# Patient Record
Sex: Female | Born: 1945
Health system: Southern US, Community
[De-identification: ages and names within clinical notes are randomized; demographics above are authoritative.]

## PROBLEM LIST (undated history)

## (undated) DIAGNOSIS — M419 Scoliosis, unspecified: Secondary | ICD-10-CM

## (undated) DIAGNOSIS — Z87442 Personal history of urinary calculi: Secondary | ICD-10-CM

## (undated) DIAGNOSIS — R569 Unspecified convulsions: Secondary | ICD-10-CM

## (undated) DIAGNOSIS — D649 Anemia, unspecified: Secondary | ICD-10-CM

## (undated) DIAGNOSIS — F329 Major depressive disorder, single episode, unspecified: Secondary | ICD-10-CM

## (undated) DIAGNOSIS — I729 Aneurysm of unspecified site: Secondary | ICD-10-CM

## (undated) DIAGNOSIS — T4145XA Adverse effect of unspecified anesthetic, initial encounter: Secondary | ICD-10-CM

## (undated) DIAGNOSIS — I10 Essential (primary) hypertension: Secondary | ICD-10-CM

## (undated) DIAGNOSIS — G8929 Other chronic pain: Secondary | ICD-10-CM

## (undated) DIAGNOSIS — R627 Adult failure to thrive: Secondary | ICD-10-CM

## (undated) DIAGNOSIS — K219 Gastro-esophageal reflux disease without esophagitis: Secondary | ICD-10-CM

## (undated) DIAGNOSIS — E876 Hypokalemia: Secondary | ICD-10-CM

## (undated) DIAGNOSIS — M1990 Unspecified osteoarthritis, unspecified site: Secondary | ICD-10-CM

## (undated) DIAGNOSIS — E43 Unspecified severe protein-calorie malnutrition: Secondary | ICD-10-CM

## (undated) DIAGNOSIS — R519 Headache, unspecified: Secondary | ICD-10-CM

## (undated) DIAGNOSIS — C44111 Basal cell carcinoma of skin of unspecified eyelid, including canthus: Secondary | ICD-10-CM

## (undated) DIAGNOSIS — B009 Herpesviral infection, unspecified: Secondary | ICD-10-CM

## (undated) DIAGNOSIS — F32A Depression, unspecified: Secondary | ICD-10-CM

## (undated) DIAGNOSIS — I639 Cerebral infarction, unspecified: Secondary | ICD-10-CM

## (undated) DIAGNOSIS — T8859XA Other complications of anesthesia, initial encounter: Secondary | ICD-10-CM

## (undated) DIAGNOSIS — R4701 Aphasia: Secondary | ICD-10-CM

## (undated) HISTORY — PX: WRIST SURGERY: SHX841

## (undated) HISTORY — PX: PARATHYROIDECTOMY: SHX19

## (undated) HISTORY — PX: ECTOPIC PREGNANCY SURGERY: SHX613

## (undated) HISTORY — PX: CEREBRAL ANEURYSM REPAIR: SHX164

## (undated) HISTORY — PX: COLONOSCOPY: SHX174

## (undated) HISTORY — PX: VOCAL CORD LATERALIZATION, ENDOSCOPIC APPROACH W/ MLB: SHX2664

## (undated) HISTORY — PX: TONSILLECTOMY: SUR1361

## (undated) HISTORY — PX: VAGINAL HYSTERECTOMY: SHX2639

---

## 1999-07-15 ENCOUNTER — Ambulatory Visit (HOSPITAL_COMMUNITY): Admission: RE | Admit: 1999-07-15 | Discharge: 1999-07-15 | Payer: Self-pay

## 1999-08-15 ENCOUNTER — Ambulatory Visit (HOSPITAL_COMMUNITY): Admission: RE | Admit: 1999-08-15 | Discharge: 1999-08-16 | Payer: Self-pay

## 1999-08-15 ENCOUNTER — Encounter (INDEPENDENT_AMBULATORY_CARE_PROVIDER_SITE_OTHER): Payer: Self-pay | Admitting: *Deleted

## 2000-02-26 ENCOUNTER — Ambulatory Visit (HOSPITAL_COMMUNITY): Admission: RE | Admit: 2000-02-26 | Discharge: 2000-02-26 | Payer: Self-pay | Admitting: Family Medicine

## 2000-02-26 ENCOUNTER — Encounter: Payer: Self-pay | Admitting: Family Medicine

## 2000-04-29 ENCOUNTER — Ambulatory Visit (HOSPITAL_COMMUNITY): Admission: RE | Admit: 2000-04-29 | Discharge: 2000-04-29 | Payer: Self-pay | Admitting: *Deleted

## 2000-04-29 ENCOUNTER — Encounter: Payer: Self-pay | Admitting: *Deleted

## 2000-05-28 ENCOUNTER — Encounter: Payer: Self-pay | Admitting: Family Medicine

## 2000-05-28 ENCOUNTER — Ambulatory Visit (HOSPITAL_COMMUNITY): Admission: RE | Admit: 2000-05-28 | Discharge: 2000-05-28 | Payer: Self-pay | Admitting: Family Medicine

## 2001-03-17 ENCOUNTER — Other Ambulatory Visit: Admission: RE | Admit: 2001-03-17 | Discharge: 2001-03-17 | Payer: Self-pay | Admitting: Family Medicine

## 2001-06-22 ENCOUNTER — Ambulatory Visit (HOSPITAL_COMMUNITY): Admission: RE | Admit: 2001-06-22 | Discharge: 2001-06-22 | Payer: Self-pay | Admitting: Gastroenterology

## 2003-02-09 ENCOUNTER — Encounter: Payer: Self-pay | Admitting: Family Medicine

## 2003-02-09 ENCOUNTER — Ambulatory Visit (HOSPITAL_COMMUNITY): Admission: RE | Admit: 2003-02-09 | Discharge: 2003-02-09 | Payer: Self-pay | Admitting: Family Medicine

## 2003-12-05 ENCOUNTER — Encounter: Admission: RE | Admit: 2003-12-05 | Discharge: 2003-12-05 | Payer: Self-pay | Admitting: Infectious Diseases

## 2004-01-30 ENCOUNTER — Encounter: Admission: RE | Admit: 2004-01-30 | Discharge: 2004-01-30 | Payer: Self-pay | Admitting: Infectious Diseases

## 2004-02-28 ENCOUNTER — Encounter: Admission: RE | Admit: 2004-02-28 | Discharge: 2004-02-28 | Payer: Self-pay | Admitting: Infectious Diseases

## 2004-05-05 ENCOUNTER — Encounter: Admission: RE | Admit: 2004-05-05 | Discharge: 2004-05-05 | Payer: Self-pay | Admitting: Infectious Diseases

## 2005-12-03 ENCOUNTER — Other Ambulatory Visit: Admission: RE | Admit: 2005-12-03 | Discharge: 2005-12-03 | Payer: Self-pay | Admitting: Obstetrics and Gynecology

## 2006-03-03 ENCOUNTER — Ambulatory Visit (HOSPITAL_COMMUNITY): Admission: RE | Admit: 2006-03-03 | Discharge: 2006-03-03 | Payer: Self-pay | Admitting: Family Medicine

## 2006-03-17 ENCOUNTER — Encounter: Admission: RE | Admit: 2006-03-17 | Discharge: 2006-03-17 | Payer: Self-pay | Admitting: Family Medicine

## 2006-12-27 ENCOUNTER — Inpatient Hospital Stay (HOSPITAL_COMMUNITY): Admission: EM | Admit: 2006-12-27 | Discharge: 2006-12-27 | Payer: Self-pay | Admitting: Emergency Medicine

## 2007-03-11 ENCOUNTER — Inpatient Hospital Stay (HOSPITAL_COMMUNITY): Admission: AD | Admit: 2007-03-11 | Discharge: 2007-03-15 | Payer: Self-pay | Admitting: Urology

## 2007-04-14 ENCOUNTER — Encounter: Admission: RE | Admit: 2007-04-14 | Discharge: 2007-04-14 | Payer: Self-pay | Admitting: Family Medicine

## 2007-04-21 ENCOUNTER — Encounter: Admission: RE | Admit: 2007-04-21 | Discharge: 2007-04-21 | Payer: Self-pay | Admitting: Family Medicine

## 2007-10-31 ENCOUNTER — Encounter: Admission: RE | Admit: 2007-10-31 | Discharge: 2007-10-31 | Payer: Self-pay | Admitting: Family Medicine

## 2008-05-16 ENCOUNTER — Encounter: Admission: RE | Admit: 2008-05-16 | Discharge: 2008-05-16 | Payer: Self-pay | Admitting: Family Medicine

## 2008-05-17 ENCOUNTER — Encounter (INDEPENDENT_AMBULATORY_CARE_PROVIDER_SITE_OTHER): Payer: Self-pay | Admitting: Diagnostic Radiology

## 2008-05-17 ENCOUNTER — Encounter: Admission: RE | Admit: 2008-05-17 | Discharge: 2008-05-17 | Payer: Self-pay | Admitting: Family Medicine

## 2008-11-01 ENCOUNTER — Other Ambulatory Visit: Admission: RE | Admit: 2008-11-01 | Discharge: 2008-11-01 | Payer: Self-pay | Admitting: Obstetrics and Gynecology

## 2009-07-02 ENCOUNTER — Encounter: Admission: RE | Admit: 2009-07-02 | Discharge: 2009-07-02 | Payer: Self-pay | Admitting: Family Medicine

## 2009-11-05 ENCOUNTER — Other Ambulatory Visit
Admission: RE | Admit: 2009-11-05 | Discharge: 2009-11-05 | Payer: Self-pay | Source: Home / Self Care | Admitting: Obstetrics and Gynecology

## 2010-08-29 ENCOUNTER — Encounter: Admission: RE | Admit: 2010-08-29 | Discharge: 2010-08-29 | Payer: Self-pay | Admitting: Family Medicine

## 2010-12-15 ENCOUNTER — Ambulatory Visit (HOSPITAL_COMMUNITY)
Admission: RE | Admit: 2010-12-15 | Discharge: 2010-12-15 | Disposition: A | Discharge status: Home / Self Care | Payer: Medicare Other | Attending: Psychiatry | Admitting: Psychiatry

## 2010-12-15 DIAGNOSIS — F339 Major depressive disorder, recurrent, unspecified: Secondary | ICD-10-CM | POA: Insufficient documentation

## 2011-01-30 ENCOUNTER — Other Ambulatory Visit: Payer: Self-pay | Admitting: Ophthalmology

## 2011-02-09 ENCOUNTER — Other Ambulatory Visit: Payer: Self-pay | Admitting: Ophthalmology

## 2011-02-10 NOTE — Discharge Summary (Signed)
Elaine Rivera, Elaine Rivera             ACCOUNT NO.:  [id number]   MEDICAL RECORD NO.:  [id number]          PATIENT TYPE:  INP   LOCATION:  1409                         FACILITY:  [facility]   PHYSICIAN:  Excell Seltzer. Annabell Howells, M.D.    DATE OF BIRTH:  1946/06/16   DATE OF ADMISSION:  03/11/2007  DATE OF DISCHARGE:  03/15/2007                               DISCHARGE SUMMARY   HISTORY OF PRESENT ILLNESS:  Briefly, Elaine Rivera is a 65 year old white  female who came to my office with the complain of chills, fever and  myalgias.  She had had a history of stones and was having left flank  pain that required oxycodone.  For additional details of her history and  physical, please see the dictated note.   ALLERGIES:  SULFA.   CURRENT MEDICATIONS:  1 . Boniva 150 mg monthly.  1. Sertraline 200 mg daily.  2. Caduet 10/40 daily.  3. Aleve as needed.   MEDICAL HISTORY:  1. Hypercholesterolemia.  2. Hyperparathyroidism.  3. Acid reflux.  4. Depression.  5. Anxiety.  6. Stones.  She has had prior lithotripsy x2.  A CT scan was obtained      in my office.  This reveals a left kidney with possible overt      nephronia.  There are multiple stones in the left kidney and mild      dilation in the left proximal ureter, but no significant      obstructing stones were seen.  Her blood pressure in the office was      actually low at 84/57 with a heart rate of 111, temperature 100.4.      She was felt to require discharge for a left pyelonephritis with      possible septicemia.  Secondary diagnoses include bilateral renal      calculi and cholelithiasis.   HOSPITAL COURSE:  On admission, the patient was started on IV Cipro and  fluids.  CBC, CMP and blood and urine cultures were obtained.  Her  admission white count was 18.9 with a hemoglobin 11.7.  Chemistries had  a sodium 133, potassium 3.2, glucose of 176, but was otherwise normal  with the except exception of albumin of 2.5.  Her blood cultures from  the right  hand grew an E-coli sensitive to Cipro.  Urine culture was  done in the office.   The patient responded well to antibiotic therapy with improvement in her  symptoms by the following day, and a decline in her temperature to  100.1.  She was voiding well.  Gentamicin was added to her regimen to  broaden coverage pending her cultures.  By June 16, she was feeling  better.  She still had some left flank pain and mild blunting.  Her T-  max was 101.1.  Her left flank pain had improved but she was slightly  distended.  Potassium was down to 3.  She was given potassium supplement  and continued on her antibiotic.  On June 17, she was feeling much  better with reduced pain.  She had been afebrile.  She was felt be  ready  for discharge home with a diagnosis of left pyelonephritis/urosepsis  with bilateral renal calculi.  This was complicated by hypokalemia.   DISCHARGE MEDICATIONS:  Cipro and resumption of her home medications.  She was instructed to follow-up with Dr. Aldean Ast in 2 weeks.   PROGNOSIS:  Good.   CONDITION ON DISCHARGE:  Improved.   DISPOSITION:  To home.      Excell Seltzer. Annabell Howells, M.D.  Electronically Signed     JJW/MEDQ  D:  03/31/2007  T:  04/01/2007  Job:  [id number]   cc:   Courtney Paris, M.D.  Fax: [phone]

## 2011-02-10 NOTE — H&P (Signed)
NAMERONNISHA, FELBER             ACCOUNT NO.:  [id number]   MEDICAL RECORD NO.:  [id number]           PATIENT TYPE:   LOCATION:                                 FACILITY:   PHYSICIAN:  Excell Seltzer. Annabell Howells, M.D.    DATE OF BIRTH:  1945/12/31   DATE OF ADMISSION:  03/11/2007  DATE OF DISCHARGE:                              HISTORY & PHYSICAL   Ms. Colberg is a 65 year old white female with a history of stones who  had the onset over the weekend of chills, fever, and myalgias.  She has  had some left flank pain severe enough to require oxycodone which she  last took this morning.  She has also had some nausea and vomiting.  She  denies hematuria.  She has had some dysuria with frequency and urgency.   PAST HISTORY:   ALLERGIES:  SULFA.   CURRENT MEDICATIONS:  1. Boniva 150 mg monthly.  2. Sertraline 200 mg daily.  3. Caduet 10/40 daily.  4. Aleve p.r.n.   MEDICAL HISTORY:  1. Hypercholesterolemia.  2. History of hyperparathyroidism.  3. Acid reflux.  4. Depression and anxiety.   SURGICAL HISTORY:  1. Left vertebrobasilar aneurysm repair in 1998.  2. Parathyroidectomy in 2002.  3. Hysterectomy in 1994.  4. Liposuction in 1991 and 1998.  5. Two prior tubal pregnancies in 1996 and 1997.  6. She had a compound fracture of her left wrist repaired on December 27, 2006.  7. Lithotripsy x2.   FAMILY HISTORY:  Pertinent for kidney stones in her father and her  daughter who also had blood in her urine.  Mother had cancer.  Father  had heart disease.   SOCIAL HISTORY:  Negative for tobacco.  She drinks occasional alcohol.  She is a Occupational psychologist.  She is divorced.   REVIEW OF SYSTEMS:  She has had fever, chills, night sweats, and  fatigue, some skin itching, blurred vision, double vision, sore throat  and sinus congestion over the weekend, some swollen glands in her neck,  shortness of breath, excessive thirst, chronic back pain and arthralgias  but it has been  worse with this current ailment, the nausea and  vomiting, left flank and abdominal pain, some diarrhea last weekend with  dark stools, headaches, dizziness, depression, anxiety.  She is  otherwise without complaints per our check list except as above.   PHYSICAL EXAMINATION:  VITAL SIGNS:  Her blood pressure is 84/57, heart  rate is 111, temp is 100.4.  GENERAL:  She is a well-developed, well-nourished, somewhat pale-  appearing white female in no acute distress.  Alert and oriented x3.  HEAD/FACE:  Normocephalic atraumatic.  NECK:  Supple without thyromegaly or bruit.  LUNGS:  Clear with normal effort.  HEART:  Regular rate and rhythm.  ABDOMEN:  Soft, flat with left CVA tenderness and left upper abdominal  tenderness.  No mass, hepatosplenomegaly, or hernias are noted.  She has  no cervical or supraclavicular adenopathy.  GU:  Not performed.  RECTAL:  Not performed.  EXTREMITIES:  Full range of motion without edema.  NEUROLOGIC:  She is grossly intact.  SKIN:  Warm and dry.   Her urinalysis today is nitrite positive with 15-50 white cells, 2+  bacteria on an unspun specimen.   A CT urogram was obtained today because of her history of stones.  This  demonstrates what appears to be a swollen left kidney with possible  lobar nephronia on the upper lateral aspect.  There are multiple stones  in the left kidney and mild dilation of the left proximal ureter without  evidence of a stone in the ureter.  There is significant perinephric  inflammatory change.  She does appear to have a probable diaphragmatic  hernia with scoliosis.  The right kidney has small calcifications but is  otherwise unremarkable.  There are multiple small gallstones in the  gallbladder.  A full radiologic report will be forthcoming.   I have reviewed our prior office records.   IMPRESSION:  1. Left pyelonephritis with vital signs suggestive of impending      septicemia.  2. Bilateral renal calculi.  3.  Cholelithiasis.   PLAN:  I am going to go ahead and admit her to the hospital for IV  hydration, IV Cipro, CBC, CMP, and blood cultures, a urine culture was  obtained in the office today.  I have discussed her case with Dr.  Sherron Monday who is on call for [place] this weekend.      Excell Seltzer. Annabell Howells, M.D.  Electronically Signed     JJW/MEDQ  D:  03/11/2007  T:  03/11/2007  Job:  [id number]   cc:   Dario Guardian, M.D.  Fax: [phone]

## 2011-02-13 NOTE — Op Note (Signed)
Elaine Rivera, Elaine Rivera             ACCOUNT NO.:  [id number]   MEDICAL RECORD NO.:  [id number]          PATIENT TYPE:  INP   LOCATION:  0098                         FACILITY:  [facility]   PHYSICIAN:  Artist Pais. Weingold, M.D.DATE OF BIRTH:  December 25, 1945   DATE OF PROCEDURE:  12/27/2006  DATE OF DISCHARGE:                               OPERATIVE REPORT   PREOPERATIVE DIAGNOSIS:  Grade 1 open fracture, radius and ulna, left  side.   POSTOPERATIVE DIAGNOSIS:  Grade 1 open fracture, radius and ulna, left  side.   PROCEDURE:  1. Open reduction internal fixation of above.  2. Irrigation and debridement.  3. Carpal tunnel release through separate incision.   SURGEON:  Artist Pais. Mina Marble, M.D.   ANESTHESIA:  General.   TOURNIQUET TIME:  1 hour.   No complication.  No drains.   OPERATIVE REPORT:  The patient was taken to the operating suite.  After  the induction of adequate general anesthesia, left upper extremity was  prepped and draped in sterile fashion.  Esmarch was used to exsanguinate  the limb.  Tourniquet was then inflated to 250 mmHg at this point in  time.  The open area along the volar ulnar aspect of the left ulna  distally was opened.  Dissection was carried down to the ulnar shaft.  The open fracture site was debrided of clot and nonviable material.  Once this was done and the hand was fully supinated, FCR approach was  undertaken to the distal radius.  Interval between the FCR and the  radial artery was identified and split.  The pronator quadratus was  subperiosteally stripped off the fracture site.  The fracture was  identified and debrided of clot and nonviable material.  Reduction was  performed.  DVR plate was then placed in lower aspect, and fracture  fixation was undertaken using standard DVR protocol.  Once this was  done, the wrist was stable in flexion, extension, pronation, supination.  The ulna was reduced manually and felt to be stable.  A third incision  was then made in the area of the palmar aspect of left hand and the  thenar crease 2 cm.  Skin was incised.  Palmar fascia was identified and  split.  The distal edge of the transverse carpal ligament was identified  and split with a 15 blade.  The median nerve was identified and  protected with a Therapist, nutritional.  The transverse ligament was divided.  There was  blood in the canal that was irrigated out.  All 3 wounds were irrigated  and closed with 3-0 Prolene subcuticular stitches.  Steri-Strips, 4x4s,  fluffs and a volar splint was applied.  The patient tolerated the  procedures well __________  fashion.      Artist Pais Mina Marble, M.D.  Electronically Signed     MAW/MEDQ  D:  12/27/2006  T:  12/28/2006  Job:  [id number]

## 2011-02-13 NOTE — Procedures (Signed)
[place]  Patient:    Elaine Rivera, Elaine Rivera Visit Number: [id number] MRN: [id number]          Service Type: END Location: ENDO Attending Physician:  Charna Elizabeth Dictated by:   Anselmo Rod, M.D. Proc. Date: 06/22/01 Admit Date:  06/22/2001   CC:         Dario Guardian, M.D.   Procedure Report  DATE OF BIRTH:  [birth year]  REFERRING PHYSICIAN:  Dario Guardian, M.D.  PROCEDURE PERFORMED:  Colonoscopy.  ENDOSCOPIST:  Anselmo Rod, M.D.  INSTRUMENT USED:  Olympus pediatric video colonoscope.  INDICATIONS FOR PROCEDURE:  Guaiac positive stool and history of diarrhea in a 65 year old white female rule out colonic polyps, masses, hemorrhoids, etc.  PREPROCEDURE PREPARATION:  Informed consent was procured from the patient. The patient was fasted for eight hours prior to the procedure and prepped with a bottle of magnesium citrate and a gallon of NuLytely the night prior to the procedure.  PREPROCEDURE PHYSICAL:  The patient had stable vital signs.  Neck supple. Chest clear to auscultation.  S1, S2 regular.  Abdomen soft with normal abdominal bowel sounds.  DESCRIPTION OF PROCEDURE:  The patient was placed in the left lateral decubitus position and sedated with 60 mg of Demerol and 6 mg of Versed intravenously.  Once the patient was adequately sedated and maintained on low-flow oxygen and continuous cardiac monitoring, the Olympus video colonoscope was advanced from the rectum to the cecum without difficulty. The entire colonic mucosa appeared healthy and without lesions, no masses polyps erosions, ulcerations or diverticula were seen.  IMPRESSION:  Normal colonoscopy.  RECOMMENDATIONS:  High fiber diet has been recommended for the patient and outpatient follow-up is advised in the next four weeks.Dictated by:   Anselmo Rod, M.D. Attending Physician:  Charna Elizabeth DD:  06/22/01 TD:  06/22/01 Job: [id number]

## 2011-02-13 NOTE — Consult Note (Signed)
NAMELAVENE, PENAGOS             ACCOUNT NO.:  [id number]   MEDICAL RECORD NO.:  [id number]          PATIENT TYPE:  INP   LOCATION:  0098                         FACILITY:  [facility]   PHYSICIAN:  Artist Pais. Weingold, M.D.DATE OF BIRTH:  Sep 11, 1946   DATE OF CONSULTATION:  12/27/2006  DATE OF DISCHARGE:                                 CONSULTATION   REQUESTING PHYSICIAN:  Trudi Ida. Denton Lank, M.D.   REASON FOR CONSULTATION:  Jaclyne Timberlake is a 65 year old right-hand  dominant female who fell on her outstretched left upper extremity.  Presents today with a grade 1 open fracture, radius and ulna to the left  side.  She is 65 years old.  She is right-hand dominant.   She has an allergy to SULFA DRUGS.   She is on Caduet for hypertension and hypercholesterolemia.   Her medical doctor is Dr. Merri Brunette.   No significant past medical or surgical history otherwise noted.   SOCIAL HISTORY:  Noncontributory.   PHYSICAL EXAMINATION:  GENERAL:  A well-developed and well-nourished  female who is pleasant, alert and oriented x3.  EXTREMITIES:  Examination of her left upper extremity, she has an  obvious deformity with bleeding along the ulnar volar aspect in the area  of the distal third of the ulna.  She has an obvious deformity with  pain.  She complains of intermittent numbness and tingling in the median  distribution.   X-rays show a fracture of the distal aspect of the radius and ulna,  which is comminuted.  She has a grade I open fracture.  We discussed  with Ms. Schrom the treatment options.  She needs to the operating room  emergently for an I&D ORIF for a grade I open fracture, radius and ulna,  on her nondominant left side.      Artist Pais Mina Marble, M.D.  Electronically Signed     MAW/MEDQ  D:  12/27/2006  T:  12/28/2006  Job:  [id number]

## 2011-03-04 ENCOUNTER — Ambulatory Visit: Payer: Medicare Other | Attending: Family Medicine | Admitting: Rehabilitation

## 2011-03-04 DIAGNOSIS — M2550 Pain in unspecified joint: Secondary | ICD-10-CM | POA: Insufficient documentation

## 2011-03-04 DIAGNOSIS — IMO0001 Reserved for inherently not codable concepts without codable children: Secondary | ICD-10-CM | POA: Insufficient documentation

## 2011-03-04 DIAGNOSIS — M4120 Other idiopathic scoliosis, site unspecified: Secondary | ICD-10-CM | POA: Insufficient documentation

## 2011-03-04 DIAGNOSIS — R293 Abnormal posture: Secondary | ICD-10-CM | POA: Insufficient documentation

## 2011-03-04 DIAGNOSIS — R262 Difficulty in walking, not elsewhere classified: Secondary | ICD-10-CM | POA: Insufficient documentation

## 2011-03-12 ENCOUNTER — Ambulatory Visit: Payer: Medicare Other | Admitting: Physical Therapy

## 2011-03-17 ENCOUNTER — Ambulatory Visit: Payer: Medicare Other | Admitting: Rehabilitation

## 2011-03-19 ENCOUNTER — Ambulatory Visit: Payer: Medicare Other | Admitting: Physical Therapy

## 2011-03-24 ENCOUNTER — Ambulatory Visit: Payer: Medicare Other | Admitting: Rehabilitation

## 2011-03-26 ENCOUNTER — Ambulatory Visit: Payer: Medicare Other | Admitting: Rehabilitation

## 2011-03-31 ENCOUNTER — Encounter: Payer: Medicare Other | Admitting: Rehabilitation

## 2011-04-02 ENCOUNTER — Ambulatory Visit: Payer: Medicare Other | Attending: Family Medicine | Admitting: Rehabilitation

## 2011-04-02 DIAGNOSIS — R262 Difficulty in walking, not elsewhere classified: Secondary | ICD-10-CM | POA: Insufficient documentation

## 2011-04-02 DIAGNOSIS — M2550 Pain in unspecified joint: Secondary | ICD-10-CM | POA: Insufficient documentation

## 2011-04-02 DIAGNOSIS — M4120 Other idiopathic scoliosis, site unspecified: Secondary | ICD-10-CM | POA: Insufficient documentation

## 2011-04-02 DIAGNOSIS — IMO0001 Reserved for inherently not codable concepts without codable children: Secondary | ICD-10-CM | POA: Insufficient documentation

## 2011-04-02 DIAGNOSIS — R293 Abnormal posture: Secondary | ICD-10-CM | POA: Insufficient documentation

## 2011-04-07 ENCOUNTER — Ambulatory Visit: Payer: Medicare Other | Admitting: Rehabilitation

## 2011-04-09 ENCOUNTER — Ambulatory Visit: Payer: Medicare Other | Admitting: Rehabilitation

## 2011-04-15 ENCOUNTER — Encounter: Payer: Medicare Other | Admitting: Rehabilitation

## 2011-04-16 ENCOUNTER — Ambulatory Visit: Payer: Medicare Other | Admitting: Rehabilitation

## 2011-04-21 ENCOUNTER — Encounter: Payer: Medicare Other | Admitting: Rehabilitation

## 2011-04-23 ENCOUNTER — Ambulatory Visit: Payer: Medicare Other | Admitting: Rehabilitation

## 2011-04-28 ENCOUNTER — Encounter: Payer: Medicare Other | Admitting: Rehabilitation

## 2011-04-29 ENCOUNTER — Ambulatory Visit: Payer: Medicare Other | Attending: Rehabilitation | Admitting: Rehabilitation

## 2011-04-29 DIAGNOSIS — M4120 Other idiopathic scoliosis, site unspecified: Secondary | ICD-10-CM | POA: Insufficient documentation

## 2011-04-29 DIAGNOSIS — M2550 Pain in unspecified joint: Secondary | ICD-10-CM | POA: Insufficient documentation

## 2011-04-29 DIAGNOSIS — IMO0001 Reserved for inherently not codable concepts without codable children: Secondary | ICD-10-CM | POA: Insufficient documentation

## 2011-04-29 DIAGNOSIS — R293 Abnormal posture: Secondary | ICD-10-CM | POA: Insufficient documentation

## 2011-04-29 DIAGNOSIS — R262 Difficulty in walking, not elsewhere classified: Secondary | ICD-10-CM | POA: Insufficient documentation

## 2011-05-05 ENCOUNTER — Ambulatory Visit: Payer: Medicare Other | Admitting: Rehabilitation

## 2011-05-07 ENCOUNTER — Ambulatory Visit: Payer: Medicare Other | Admitting: Rehabilitation

## 2011-05-12 ENCOUNTER — Ambulatory Visit: Payer: Medicare Other | Admitting: Rehabilitation

## 2011-05-14 ENCOUNTER — Ambulatory Visit: Payer: Medicare Other | Admitting: Rehabilitation

## 2011-05-19 ENCOUNTER — Ambulatory Visit: Payer: Medicare Other | Admitting: Rehabilitation

## 2011-05-21 ENCOUNTER — Encounter: Payer: Medicare Other | Admitting: Rehabilitation

## 2011-05-27 ENCOUNTER — Ambulatory Visit: Payer: Medicare Other | Admitting: Rehabilitation

## 2011-06-09 ENCOUNTER — Encounter: Payer: Medicare Other | Admitting: Rehabilitation

## 2011-06-11 ENCOUNTER — Encounter: Payer: Medicare Other | Admitting: Rehabilitation

## 2011-06-16 ENCOUNTER — Encounter: Payer: Medicare Other | Admitting: Rehabilitation

## 2011-06-18 ENCOUNTER — Ambulatory Visit: Payer: Medicare Other | Attending: Rehabilitation | Admitting: Rehabilitation

## 2011-06-18 DIAGNOSIS — R262 Difficulty in walking, not elsewhere classified: Secondary | ICD-10-CM | POA: Insufficient documentation

## 2011-06-18 DIAGNOSIS — M2550 Pain in unspecified joint: Secondary | ICD-10-CM | POA: Insufficient documentation

## 2011-06-18 DIAGNOSIS — R293 Abnormal posture: Secondary | ICD-10-CM | POA: Insufficient documentation

## 2011-06-18 DIAGNOSIS — M4120 Other idiopathic scoliosis, site unspecified: Secondary | ICD-10-CM | POA: Insufficient documentation

## 2011-06-18 DIAGNOSIS — IMO0001 Reserved for inherently not codable concepts without codable children: Secondary | ICD-10-CM | POA: Insufficient documentation

## 2011-06-30 ENCOUNTER — Ambulatory Visit: Payer: Medicare Other | Attending: Rehabilitation | Admitting: Rehabilitation

## 2011-06-30 DIAGNOSIS — R293 Abnormal posture: Secondary | ICD-10-CM | POA: Insufficient documentation

## 2011-06-30 DIAGNOSIS — M2550 Pain in unspecified joint: Secondary | ICD-10-CM | POA: Insufficient documentation

## 2011-06-30 DIAGNOSIS — IMO0001 Reserved for inherently not codable concepts without codable children: Secondary | ICD-10-CM | POA: Insufficient documentation

## 2011-06-30 DIAGNOSIS — M4120 Other idiopathic scoliosis, site unspecified: Secondary | ICD-10-CM | POA: Insufficient documentation

## 2011-06-30 DIAGNOSIS — R262 Difficulty in walking, not elsewhere classified: Secondary | ICD-10-CM | POA: Insufficient documentation

## 2011-07-01 ENCOUNTER — Encounter: Payer: Medicare Other | Admitting: Rehabilitation

## 2011-07-07 ENCOUNTER — Encounter: Payer: Medicare Other | Admitting: Rehabilitation

## 2011-07-09 ENCOUNTER — Encounter: Payer: Medicare Other | Admitting: Rehabilitation

## 2011-07-15 ENCOUNTER — Encounter: Payer: Medicare Other | Admitting: Rehabilitation

## 2011-07-15 LAB — CBC
HCT: 34.7 — ABNORMAL LOW
Hemoglobin: 11.7 — ABNORMAL LOW
RDW: 14
WBC: 16.1 — ABNORMAL HIGH

## 2011-07-15 LAB — BASIC METABOLIC PANEL
BUN: 11
CO2: 27
Calcium: 8.2 — ABNORMAL LOW
GFR calc Af Amer: >60
Sodium: 137

## 2011-07-16 ENCOUNTER — Encounter: Payer: Medicare Other | Admitting: Rehabilitation

## 2011-07-16 LAB — COMPREHENSIVE METABOLIC PANEL
ALT: 20
Albumin: 2.5 — ABNORMAL LOW
CO2: 26
Calcium: 8.7
Creatinine, Ser: 1.01
GFR calc Af Amer: >60
GFR calc non Af Amer: 56 — ABNORMAL LOW
Glucose, Bld: 176 — ABNORMAL HIGH
Total Protein: 6

## 2011-07-16 LAB — CBC
HCT: 34.8 — ABNORMAL LOW
Hemoglobin: 11.7 — ABNORMAL LOW
MCHC: 33.8
MCV: 85.6
Platelets: 263

## 2011-07-16 LAB — CULTURE, BLOOD (ROUTINE X 2): Culture: NO GROWTH

## 2011-10-08 ENCOUNTER — Other Ambulatory Visit: Payer: Self-pay | Admitting: Family Medicine

## 2011-10-08 DIAGNOSIS — Z1231 Encounter for screening mammogram for malignant neoplasm of breast: Secondary | ICD-10-CM

## 2011-11-05 ENCOUNTER — Other Ambulatory Visit: Payer: Self-pay | Admitting: Urology

## 2011-11-10 ENCOUNTER — Ambulatory Visit
Admission: RE | Admit: 2011-11-10 | Discharge: 2011-11-10 | Disposition: A | Discharge status: Home / Self Care | Payer: Medicare Other | Source: Ambulatory Visit | Attending: Family Medicine | Admitting: Family Medicine

## 2011-11-10 DIAGNOSIS — Z1231 Encounter for screening mammogram for malignant neoplasm of breast: Secondary | ICD-10-CM

## 2011-11-30 ENCOUNTER — Encounter (HOSPITAL_COMMUNITY): Payer: Self-pay | Admitting: *Deleted

## 2011-11-30 NOTE — Progress Notes (Signed)
This RN spoke with patient about lithotripsy scheduled for Monday, 12/07/2011 at [facility], Short Stay. Patient was instructed not to eat or drink after midnight, do not take any aspirin, ibuprofen, toradol , over the counter medications, multivitamins or herbs 72 hours before procedure. Take the laxative on Sunday afternoon and do not eat a heavy meal afterwards. Patient is to arrive at [facility] long short stay at 05:30am with her blue folder (with paperwork filled out), driver, and her drivers license and insurance card. There are no medications to take the morning of the procedure. Patient takes her BP medications at night. Patient to call short stay, [phone], if she has any further questions regarding the lithotripsy.

## 2011-12-03 ENCOUNTER — Encounter (HOSPITAL_COMMUNITY): Payer: Self-pay | Admitting: Pharmacy Technician

## 2011-12-06 NOTE — H&P (Signed)
Urology History and Physical Exam  CC: Nephrolithiasis  HPI: 66 year old female. History of urolithiasis. CT in December 2012 revealed a large stone burden on the left as well as stones on the right (left upper pole: 9.3 mm, 5.7 mm, 4.3 mm, 5 mm; left lower pole 6.5 mm, 9.7 mm, left mid lower pole 16 mm; right upper pole 9.4 mm, 4.3 mm; right middle pole 2.9 mm; right upper pole 3.6 mm). Her renal stones are visible on KUB.  I have recommended PCNL.  The patient has had shockwave lithotripsy in the past and would prefer to attempt this method again before more invasive procedures are undertaken.  I explained to her that due to the position of her kidney these stones would be less likely to clear the normal. We have discussed the risks, benefits, alternatives, and likelihood of achieving her goals. We discussed starting on the larger stone in her mid lower pole and if this breaks up we will then work on the left upper pole stone.   Her urine culture grew lactobacillus.   PMH: Past Medical History  Diagnosis Date  . Hypertension   . Basal cell carcinoma of eye     biopsy of left eye/ non cancerous  . Kidney stones     twice/ 1997 & 1998  . Depression     PSH: Past Surgical History  Procedure Date  . Ectopic pregnancy surgery     x2   . Vaginal hysterectomy     2001  . Cerebral aneurysm repair     1998  . Wrist surgery     left side/2008  . Wrist surgery     right side/ 2002  . Tonsillectomy     1953  . Parathyroidectomy     2002  . Vocal cord lateralization, endoscopic approach w/ mlb     2007 (@[facility] )    Allergies: Allergies  Allergen Reactions  . Sulfa Antibiotics Other (See Comments)    Pt. Took as as child. Patient stated, "I was told as a child never to take sulfa drugs again. I don't know what happens if it was given to me."    Medications: No prescriptions prior to admission     Social History: History   Social History  . Marital Status: Divorced   Spouse Name: N/A    Number of Children: N/A  . Years of Education: N/A   Occupational History  . Not on file.   Social History Main Topics  . Smoking status: Not on file  . Smokeless tobacco: Not on file  . Alcohol Use: Not on file  . Drug Use: Not on file  . Sexually Active: Not on file   Other Topics Concern  . Not on file   Social History Narrative  . No narrative on file    Family History: No family history on file.  Review of Systems: Positive: Right arm ache. Negative: Chest pain, SOB.  A further 10 point review of systems was negative except what is listed in the HPI.  Physical Exam:  General: No acute distress.  Awake. Head:  Normocephalic.  Atraumatic. ENT:  EOMI.  Mucous membranes moist Neck:  Supple.  No lymphadenopathy. CV:  S1 present. S2 present. Regular rate. Pulmonary: Equal effort bilaterally.  Clear to auscultation bilaterally. Abdomen: Soft.  Non- tender to palpation. Skin:  Normal turgor.  No visible rash. Extremity: No gross deformity of bilateral upper extremities.  No gross deformity of    bilateral lower  extremities. Neurologic: Alert. Appropriate mood.   Studies:  No results found for this basename: HGB:2,WBC:2,PLT:2 in the last 72 hours  No results found for this basename: NA:2,K:2,CL:2,CO2:2,BUN:2,CREATININE:2,CALCIUM:2,MAGNESIUM:2,GFRNONAA:2,GFRAA:2 in the last 72 hours   No results found for this basename: PT:2,INR:2,APTT:2 in the last 72 hours   No components found with this basename: ABG:2    Assessment:  Left nephrolithiasis  Plan: -Proceed with shockwave lithotripsy of the left kidney.

## 2011-12-07 ENCOUNTER — Encounter (HOSPITAL_COMMUNITY): Payer: Self-pay | Admitting: *Deleted

## 2011-12-07 ENCOUNTER — Ambulatory Visit (HOSPITAL_COMMUNITY): Payer: Medicare Other

## 2011-12-07 ENCOUNTER — Encounter (HOSPITAL_COMMUNITY)
Admission: RE | Disposition: A | Discharge status: Home / Self Care | Payer: Self-pay | Source: Ambulatory Visit | Attending: Urology

## 2011-12-07 ENCOUNTER — Ambulatory Visit (HOSPITAL_COMMUNITY)
Admission: RE | Admit: 2011-12-07 | Discharge: 2011-12-07 | Disposition: A | Discharge status: Home / Self Care | Payer: Medicare Other | Source: Ambulatory Visit | Attending: Urology | Admitting: Urology

## 2011-12-07 DIAGNOSIS — I498 Other specified cardiac arrhythmias: Secondary | ICD-10-CM | POA: Insufficient documentation

## 2011-12-07 DIAGNOSIS — I4949 Other premature depolarization: Secondary | ICD-10-CM | POA: Insufficient documentation

## 2011-12-07 DIAGNOSIS — M79609 Pain in unspecified limb: Secondary | ICD-10-CM | POA: Insufficient documentation

## 2011-12-07 DIAGNOSIS — N390 Urinary tract infection, site not specified: Secondary | ICD-10-CM | POA: Insufficient documentation

## 2011-12-07 DIAGNOSIS — Z01818 Encounter for other preprocedural examination: Secondary | ICD-10-CM | POA: Insufficient documentation

## 2011-12-07 DIAGNOSIS — N200 Calculus of kidney: Secondary | ICD-10-CM | POA: Insufficient documentation

## 2011-12-07 HISTORY — DX: Major depressive disorder, single episode, unspecified: F32.9

## 2011-12-07 HISTORY — DX: Basal cell carcinoma of skin of unspecified eyelid, including canthus: C44.111

## 2011-12-07 HISTORY — DX: Depression, unspecified: F32.A

## 2011-12-07 HISTORY — DX: Essential (primary) hypertension: I10

## 2011-12-07 SURGERY — LITHOTRIPSY, ESWL
Anesthesia: LOCAL | Laterality: Left

## 2011-12-07 MED ORDER — DEXTROSE-NACL 5-0.45 % IV SOLN
INTRAVENOUS | Status: DC
  Administered 2011-12-07: 07:00:00 via INTRAVENOUS

## 2011-12-07 MED ORDER — CEFAZOLIN SODIUM 1-5 GM-% IV SOLN
INTRAVENOUS | Status: AC
  Administered 2011-12-07: 1000 mg via INTRAVENOUS
  Filled 2011-12-07: qty 50

## 2011-12-07 MED ORDER — OXYCODONE-ACETAMINOPHEN 5-325 MG PO TABS
1.0000 | ORAL_TABLET | ORAL | Status: AC | PRN
Start: 2011-12-07 — End: 2011-12-17

## 2011-12-07 MED ORDER — DIAZEPAM 5 MG PO TABS
10.0000 mg | ORAL_TABLET | ORAL | Status: AC
  Administered 2011-12-07: 10 mg via ORAL

## 2011-12-07 MED ORDER — DIPHENHYDRAMINE HCL 25 MG PO CAPS
25.0000 mg | ORAL_CAPSULE | ORAL | Status: AC
  Administered 2011-12-07: 25 mg via ORAL

## 2011-12-07 MED ORDER — CIPROFLOXACIN HCL 500 MG PO TABS: 500.0000 mg | ORAL_TABLET | ORAL | Status: DC

## 2011-12-07 MED ORDER — CEFAZOLIN SODIUM 1-5 GM-% IV SOLN
1.0000 g | Freq: Once | INTRAVENOUS | Status: AC
  Administered 2011-12-07: 1000 mg via INTRAVENOUS

## 2011-12-07 NOTE — Discharge Instructions (Signed)
DISCHARGE INSTRUCTIONS FOR KIDNEY STONES   MEDICATIONS:   1. DO NOT RESUME YOUR ASPIRIN, or any other medicines like ibuprofen, motrin, excedrin, advil, aleve, vitamin E, fish oil as these can all cause bleeding x 7 days.  2. Resume all your other meds from home.  ACTIVITY 1. No strenuous activity x 1week 2. No driving while on narcotic pain medications 3. Drink plenty of water 4. Continue to walk at home - you can still get blood clots when you are at home, so keep active, but don't over do it. 5. May return to work in 3 days.  BATHING 1. You can shower or take a bath.   SIGNS/SYMPTOMS TO CALL: 1. Please call us if you have a fever greater than 101.5, uncontrolled  nausea/vomiting, uncontrolled pain, dizziness, unable to urinate, chest pain, shortness of breath, leg swelling, leg pain, redness around wound, drainage from wound, or any other concerns or questions.  You can reach us at [phone].  

## 2011-12-07 NOTE — Brief Op Note (Signed)
12/07/2011  9:03 AM  PATIENT:  Elaine Rivera  66 y.o. female  PRE-OPERATIVE DIAGNOSIS:  Left Nephrolithiasis  POST-OPERATIVE DIAGNOSIS:  Left nephrolithiasis  PROCEDURE:  Procedure(s) (LRB): EXTRACORPOREAL SHOCK WAVE LITHOTRIPSY (ESWL) (Left)  SURGEON:  Surgeon(s) and Role:    * Milford Cage, MD - Primary  PHYSICIAN ASSISTANT:   ASSISTANTS: none   ANESTHESIA:   IV sedation  EBL:     BLOOD ADMINISTERED:none  DRAINS: none   LOCAL MEDICATIONS USED:  NONE  SPECIMEN:  No Specimen  DISPOSITION OF SPECIMEN:  N/A  COUNTS:  YES  TOURNIQUET:  * No tourniquets in log *  DICTATION: .Note written in paper chart  PLAN OF CARE: Discharge to home after PACU  PATIENT DISPOSITION:  PACU - hemodynamically stable.   Delay start of Pharmacological VTE agent (>24hrs) due to surgical blood loss or risk of bleeding: yes

## 2011-12-07 NOTE — Progress Notes (Signed)
Left flank is reddened with superficial breakdown due to ESWL

## 2011-12-07 NOTE — Progress Notes (Signed)
Pt states she had been on Cipro x 7days for a UTI with the last does on Saturday 12-05-11 and states that she started having right arm pain on Friday. From in the forearm radiating up to above elbow and states she "looked it up and Cipro can affect the tendons"

## 2011-12-08 ENCOUNTER — Encounter (HOSPITAL_COMMUNITY): Payer: Self-pay

## 2011-12-08 MED FILL — Ciprofloxacin HCl Tab 500 MG (Base Equiv): ORAL | Qty: 1 | Status: AC

## 2012-03-15 ENCOUNTER — Other Ambulatory Visit: Payer: Self-pay | Admitting: Urology

## 2012-03-23 ENCOUNTER — Encounter (HOSPITAL_COMMUNITY): Payer: Self-pay | Admitting: Pharmacy Technician

## 2012-03-25 NOTE — Pre-Procedure Instructions (Signed)
Npo after midnight except sip with meds,, to arrive in [place] at 1030 am with blue folder, driver insurance info, picture ID. To follow laxative instructions in blue folder the evening before the procedure, to avoid taking any Aspirin, Ibuprofen, aleve prior to litho. Patient verbalized understanding of instructions, she had litho in March of this year.

## 2012-04-04 ENCOUNTER — Ambulatory Visit (HOSPITAL_COMMUNITY): Payer: Medicare Other

## 2012-04-04 ENCOUNTER — Ambulatory Visit (HOSPITAL_COMMUNITY)
Admission: RE | Admit: 2012-04-04 | Discharge: 2012-04-04 | Disposition: A | Discharge status: Home / Self Care | Payer: Medicare Other | Source: Ambulatory Visit | Attending: Urology | Admitting: Urology

## 2012-04-04 ENCOUNTER — Encounter (HOSPITAL_COMMUNITY)
Admission: RE | Disposition: A | Discharge status: Home / Self Care | Payer: Self-pay | Source: Ambulatory Visit | Attending: Urology

## 2012-04-04 ENCOUNTER — Encounter (HOSPITAL_COMMUNITY): Payer: Self-pay | Admitting: *Deleted

## 2012-04-04 DIAGNOSIS — I10 Essential (primary) hypertension: Secondary | ICD-10-CM | POA: Insufficient documentation

## 2012-04-04 DIAGNOSIS — Z79899 Other long term (current) drug therapy: Secondary | ICD-10-CM | POA: Insufficient documentation

## 2012-04-04 DIAGNOSIS — Z85840 Personal history of malignant neoplasm of eye: Secondary | ICD-10-CM | POA: Insufficient documentation

## 2012-04-04 DIAGNOSIS — N200 Calculus of kidney: Secondary | ICD-10-CM | POA: Insufficient documentation

## 2012-04-04 SURGERY — LITHOTRIPSY, ESWL
Anesthesia: LOCAL | Laterality: Left

## 2012-04-04 MED ORDER — DIPHENHYDRAMINE HCL 25 MG PO CAPS
25.0000 mg | ORAL_CAPSULE | ORAL | Status: AC
  Administered 2012-04-04: 25 mg via ORAL

## 2012-04-04 MED ORDER — DIAZEPAM 5 MG PO TABS
ORAL_TABLET | ORAL | Status: AC
  Filled 2012-04-04: qty 2

## 2012-04-04 MED ORDER — DEXTROSE-NACL 5-0.45 % IV SOLN
INTRAVENOUS | Status: DC
  Administered 2012-04-04: 1000 mL via INTRAVENOUS

## 2012-04-04 MED ORDER — CIPROFLOXACIN HCL 500 MG PO TABS
ORAL_TABLET | ORAL | Status: AC
  Filled 2012-04-04: qty 1

## 2012-04-04 MED ORDER — ONDANSETRON HCL 4 MG/2ML IJ SOLN
4.0000 mg | INTRAMUSCULAR | Status: DC | PRN
  Administered 2012-04-04: 4 mg via INTRAVENOUS

## 2012-04-04 MED ORDER — ONDANSETRON HCL 4 MG/2ML IJ SOLN
INTRAMUSCULAR | Status: AC
  Administered 2012-04-04: 4 mg via INTRAVENOUS
  Filled 2012-04-04: qty 2

## 2012-04-04 MED ORDER — HYDROMORPHONE HCL 2 MG PO TABS: 2.0000 mg | ORAL_TABLET | ORAL | Status: AC | PRN

## 2012-04-04 MED ORDER — DIPHENHYDRAMINE HCL 25 MG PO CAPS
ORAL_CAPSULE | ORAL | Status: AC
  Filled 2012-04-04: qty 1

## 2012-04-04 MED ORDER — DIAZEPAM 5 MG PO TABS
10.0000 mg | ORAL_TABLET | ORAL | Status: AC
  Administered 2012-04-04: 10 mg via ORAL

## 2012-04-04 MED ORDER — CIPROFLOXACIN HCL 500 MG PO TABS
500.0000 mg | ORAL_TABLET | ORAL | Status: AC
  Administered 2012-04-04: 500 mg via ORAL

## 2012-04-04 NOTE — Progress Notes (Signed)
Left flank with 4 cm x 4 cm area of redness with no skin breakdown s/p ESWL.  Pt c/o nausea. Paged Dr Margarita Grizzle to obtain antiemetic.

## 2012-04-04 NOTE — H&P (Signed)
Urology History and Physical Exam  CC: Left nephrolithiasis.  HPI: 66 year old female with a history of nephrolithiasis. She had SWL of left middle pole stone earlier in 2013. She returned to clinic to discuss her remaining stone burden. She had a CT March 15, 2012 which revealed several left upper pole stones measuring from 5mm to 3mm. We discussed management options and she elected to have SWL of the left upper pole stones. We have discussed the risks, benefits, alternatives, and likelihood of achieving her goals. Her urine culture from clinic the day of her CT was negative for growth. She denies fever.  PMH: Past Medical History  Diagnosis Date  . Hypertension   . Basal cell carcinoma of eye     biopsy of left eye/ non cancerous  . Kidney stones     twice/ 1997 & 1998  . Depression     PSH: Past Surgical History  Procedure Date  . Ectopic pregnancy surgery     x2   . Vaginal hysterectomy     2001  . Cerebral aneurysm repair     1998  . Wrist surgery     left side/2008  . Wrist surgery     right side/ 2002  . Tonsillectomy     1953  . Parathyroidectomy     2002  . Vocal cord lateralization, endoscopic approach w/ mlb     2007 (@[facility] )    Allergies: Allergies  Allergen Reactions  . Sulfa Antibiotics Other (See Comments)    Pt. Took as as child. Patient stated, "I was told as a child never to take sulfa drugs again. I don't know what happens if it was given to me."    Medications: Prescriptions prior to admission  Medication Sig Dispense Refill  . amLODipine (NORVASC) 10 MG tablet Take 10 mg by mouth at bedtime.       . DULoxetine (CYMBALTA) 60 MG capsule Take 60 mg by mouth 2 (two) times daily.       Marland Kitchen oxyCODONE-acetaminophen (PERCOCET) 5-325 MG per tablet Take 1 tablet by mouth every 4 (four) hours as needed. For pain      . acyclovir (ZOVIRAX) 400 MG tablet Take 400 mg by mouth daily.          Social History: History   Social History  . Marital Status:  Divorced    Spouse Name: N/A    Number of Children: N/A  . Years of Education: N/A   Occupational History  . Not on file.   Social History Main Topics  . Smoking status: Not on file  . Smokeless tobacco: Not on file  . Alcohol Use: Not on file  . Drug Use: Not on file  . Sexually Active: Not on file   Other Topics Concern  . Not on file   Social History Narrative  . No narrative on file    Family History: History reviewed. No pertinent family history.  Review of Systems: Positive: None Negative: Fever, chest pain, SOB.  A further 10 point review of systems was negative except what is listed in the HPI.  Physical Exam:  General: No acute distress.  Awake. Head:  Normocephalic.  Atraumatic. ENT:  EOMI.  Mucous membranes moist Neck:  Supple.  No lymphadenopathy. CV:  S1 present. S2 present. Regular rate. Pulmonary: Equal effort bilaterally.  Clear to auscultation bilaterally. Abdomen: Soft.  Non- tender to palpation. Skin:  Normal turgor.  No visible rash. Extremity: No gross deformity of bilateral upper  extremities.  No gross deformity of    bilateral lower extremities. Neurologic: Alert. Appropriate mood.    Studies:  No results found for this basename: HGB:2,WBC:2,PLT:2 in the last 72 hours  No results found for this basename: NA:2,K:2,CL:2,CO2:2,BUN:2,CREATININE:2,CALCIUM:2,MAGNESIUM:2,GFRNONAA:2,GFRAA:2 in the last 72 hours   No results found for this basename: PT:2,INR:2,APTT:2 in the last 72 hours   No components found with this basename: ABG:2    Assessment:  Renal stone.  Plan: SWL of left upper pole medial renal stone.

## 2012-04-04 NOTE — Brief Op Note (Signed)
04/04/2012  3:11 PM  PATIENT:  Diamonique Gardner Candle  66 y.o. female  PRE-OPERATIVE DIAGNOSIS:  LEFT NEPHROLITHIASIS  POST-OPERATIVE DIAGNOSIS:  Left nephrolithiasis  PROCEDURE:  Procedure(s) (LRB): EXTRACORPOREAL SHOCK WAVE LITHOTRIPSY (ESWL) (Left)  SURGEON:  Surgeon(s) and Role:    * Milford Cage, MD - Primary  PHYSICIAN ASSISTANT:   ASSISTANTS: none   ANESTHESIA:   IV sedation  EBL:   None  BLOOD ADMINISTERED:none  DRAINS: none   LOCAL MEDICATIONS USED:  NONE  SPECIMEN:  No Specimen  DISPOSITION OF SPECIMEN:  N/A  COUNTS:  YES  TOURNIQUET:  * No tourniquets in log *  DICTATION: .Note written in paper chart  PLAN OF CARE: Discharge to home after PACU  PATIENT DISPOSITION:  PACU - hemodynamically stable.   Delay start of Pharmacological VTE agent (>24hrs) due to surgical blood loss or risk of bleeding: yes

## 2012-09-20 ENCOUNTER — Encounter (HOSPITAL_COMMUNITY): Payer: Self-pay | Admitting: *Deleted

## 2012-09-20 ENCOUNTER — Emergency Department (HOSPITAL_COMMUNITY): Payer: Medicare Other

## 2012-09-20 ENCOUNTER — Emergency Department (HOSPITAL_COMMUNITY)
Admission: EM | Admit: 2012-09-20 | Discharge: 2012-09-20 | Disposition: A | Discharge status: Home / Self Care | Payer: Medicare Other | Attending: Emergency Medicine | Admitting: Emergency Medicine

## 2012-09-20 DIAGNOSIS — F3289 Other specified depressive episodes: Secondary | ICD-10-CM | POA: Insufficient documentation

## 2012-09-20 DIAGNOSIS — Y9389 Activity, other specified: Secondary | ICD-10-CM | POA: Insufficient documentation

## 2012-09-20 DIAGNOSIS — T07XXXA Unspecified multiple injuries, initial encounter: Secondary | ICD-10-CM | POA: Insufficient documentation

## 2012-09-20 DIAGNOSIS — I10 Essential (primary) hypertension: Secondary | ICD-10-CM | POA: Insufficient documentation

## 2012-09-20 DIAGNOSIS — S139XXA Sprain of joints and ligaments of unspecified parts of neck, initial encounter: Secondary | ICD-10-CM | POA: Insufficient documentation

## 2012-09-20 DIAGNOSIS — Z87442 Personal history of urinary calculi: Secondary | ICD-10-CM | POA: Insufficient documentation

## 2012-09-20 DIAGNOSIS — Z79899 Other long term (current) drug therapy: Secondary | ICD-10-CM | POA: Insufficient documentation

## 2012-09-20 DIAGNOSIS — F329 Major depressive disorder, single episode, unspecified: Secondary | ICD-10-CM | POA: Insufficient documentation

## 2012-09-20 DIAGNOSIS — IMO0002 Reserved for concepts with insufficient information to code with codable children: Secondary | ICD-10-CM | POA: Insufficient documentation

## 2012-09-20 DIAGNOSIS — Z85840 Personal history of malignant neoplasm of eye: Secondary | ICD-10-CM | POA: Insufficient documentation

## 2012-09-20 DIAGNOSIS — S161XXA Strain of muscle, fascia and tendon at neck level, initial encounter: Secondary | ICD-10-CM

## 2012-09-20 MED ORDER — BACITRACIN ZINC 500 UNIT/GM EX OINT
1.0000 "application " | TOPICAL_OINTMENT | Freq: Once | CUTANEOUS | Status: AC
  Administered 2012-09-20: 1 via TOPICAL
  Filled 2012-09-20: qty 2.7

## 2012-09-20 MED ORDER — HYDROCODONE-ACETAMINOPHEN 5-325 MG PO TABS: 1.0000 | ORAL_TABLET | Freq: Four times a day (QID) | ORAL | Status: DC | PRN

## 2012-09-20 MED ORDER — HYDROCODONE-ACETAMINOPHEN 5-325 MG PO TABS
1.0000 | ORAL_TABLET | Freq: Once | ORAL | Status: AC
  Administered 2012-09-20: 1 via ORAL
  Filled 2012-09-20: qty 1
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
## 2013-10-18 ENCOUNTER — Other Ambulatory Visit: Payer: Self-pay

## 2013-10-18 DIAGNOSIS — Z1231 Encounter for screening mammogram for malignant neoplasm of breast: Secondary | ICD-10-CM

## 2013-11-13 ENCOUNTER — Ambulatory Visit: Payer: Medicare Other

## 2013-11-29 ENCOUNTER — Ambulatory Visit
Admission: RE | Admit: 2013-11-29 | Discharge: 2013-11-29 | Disposition: A | Discharge status: Home / Self Care | Payer: Medicare Other | Source: Ambulatory Visit

## 2013-11-29 DIAGNOSIS — Z1231 Encounter for screening mammogram for malignant neoplasm of breast: Secondary | ICD-10-CM

## 2015-11-06 ENCOUNTER — Other Ambulatory Visit: Payer: Self-pay

## 2015-11-06 DIAGNOSIS — Z1231 Encounter for screening mammogram for malignant neoplasm of breast: Secondary | ICD-10-CM

## 2015-11-27 ENCOUNTER — Ambulatory Visit
Admission: RE | Admit: 2015-11-27 | Discharge: 2015-11-27 | Disposition: A | Discharge status: Home / Self Care | Payer: Medicare Other | Source: Ambulatory Visit

## 2015-11-27 DIAGNOSIS — Z1231 Encounter for screening mammogram for malignant neoplasm of breast: Secondary | ICD-10-CM

## 2016-01-16 ENCOUNTER — Ambulatory Visit: Payer: Medicare Other | Attending: Neurosurgery | Admitting: Physical Therapy

## 2016-01-16 ENCOUNTER — Encounter: Payer: Self-pay | Admitting: Physical Therapy

## 2016-01-16 DIAGNOSIS — M6281 Muscle weakness (generalized): Secondary | ICD-10-CM | POA: Insufficient documentation

## 2016-01-16 DIAGNOSIS — M545 Low back pain: Secondary | ICD-10-CM
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
## 2016-01-20 ENCOUNTER — Encounter: Payer: Self-pay | Admitting: Physical Therapy

## 2016-01-20 ENCOUNTER — Ambulatory Visit: Payer: Medicare Other | Admitting: Physical Therapy

## 2016-01-20 DIAGNOSIS — M545 Low back pain: Secondary | ICD-10-CM | POA: Diagnosis not present

## 2016-01-20 DIAGNOSIS — M6281 Muscle weakness (generalized): Secondary | ICD-10-CM

## 2016-01-20 NOTE — Therapy (Signed)
[place], [place], [address] Phone: [phone]   Fax:  [phone]  Physical Therapy Treatment  Patient Details  Name: Elaine Rivera MRN: [id number] Date of Birth: 04-28-1946 Referring Provider: Almon Register, MD  Encounter Date: 01/20/2016      PT End of Session - 01/20/16 0849    Visit Number 2   Number of Visits 13   Date for PT Re-Evaluation 02/27/16   Authorization Type U[employer] MCR  Kx at 15th visit   PT Start Time 0845   PT Stop Time 0930   PT Time Calculation (min) 45 min   Activity Tolerance Patient tolerated treatment well   Behavior During Therapy [facility] for tasks assessed/performed      Past Medical History  Diagnosis Date  . Hypertension   . Basal cell carcinoma of eye     biopsy of left eye/ non cancerous  . Kidney stones     twice/ [place]  . Depression     Past Surgical History  Procedure Laterality Date  . Ectopic pregnancy surgery      x2   . Vaginal hysterectomy      2001  . Cerebral aneurysm repair      1998  . Wrist surgery      left side/2008  . Wrist surgery      right side/ 2002  . Tonsillectomy      1953  . Parathyroidectomy      2002  . Vocal cord lateralization, endoscopic approach w/ mlb      2007 (@[facility] )    There were no vitals filed for this visit.      Subjective Assessment - 01/20/16 0847    Subjective No increase in pain following eval, has to be standing for a while before pain becomes too intense. Has not been up long yet this morning.    Currently in Pain? Yes   Pain Score 5    Pain Location Back                         [facility] Adult PT Treatment/Exercise - 01/20/16 0001    Lumbar Exercises: Seated   Sit to Stand 10 reps   Lumbar Exercises: Supine   Ab Set Other (comment)  2 minutes   Bent Knee Raise Other (comment)  x30 each with TrA    Knee/Hip Exercises: Aerobic   Nustep 10 min   Knee/Hip Exercises: Standing    Heel Raises Other (comment)  30 reps at counter   Knee/Hip Exercises: Supine   Bridges with Diona Foley Squeeze Other (comment)  x30   Bridges with Clamshell Other (comment)  x30 red TB, no bridge   Shoulder Exercises: Supine   Horizontal ABduction 20 reps  cues for TrA   Theraband Level (Shoulder Horizontal ABduction) Level 1 (Yellow)                PT Education - 01/20/16 0931    Education provided Yes   Education Details soreness as result of activating core at spinal attachment site; acknowledging soreness v pain and when to stop exercises; decrease in soreness when musculature is strong enough to provide support.    Person(s) Educated Patient   Methods Explanation;Verbal cues;Tactile cues   Comprehension Verbalized understanding;Returned demonstration;Verbal cues required;Need further instruction;Tactile cues required          PT Short Term Goals - 01/16/16 1535    PT SHORT  TERM GOAL #1   Title Pt will be independent with HEP as it is so far established by 5/11   Time 3   Period Weeks   Status New   PT SHORT TERM GOAL #2   Title Patient will report joining gym that she will be continuuing at by 5/11   Time 3   Period Weeks   Status New           PT Long Term Goals - 01/16/16 1536    PT LONG TERM GOAL #1   Title Patient will verbalize feeling of improved endurance and strength by 6/1   Time 6   Period Weeks   Status New   PT LONG TERM GOAL #2   Title patient will report average pain <4/10 by 6/1   Time 6   Period Weeks   Status New   PT LONG TERM GOAL #3   Title LE MMT all to 4/5 without back pain by 6/1   Time 6   Period Weeks   Status New   PT LONG TERM GOAL #4   Title Patient will be able to demonstrate appropraite contraction of transverse abdominis with all activities with no to minimal cuing by 6/1    Time 6   Period Weeks   Status New   PT LONG TERM GOAL #5   Title FOTO to 49% ability   Time 6   Period Weeks   Status New                Plan - 01/20/16 [id number]    Clinical Impression Statement Patient had difficulty engaging transverse abdominis today, esp when asked to breathe normally and incoorporate LE motions. Will continue to benefit from skilled PT to improve postural strength and endurance.       Patient will benefit from skilled therapeutic intervention in order to improve the following deficits and impairments:  Decreased range of motion, Difficulty walking, Pain, Decreased activity tolerance, Decreased endurance, Improper body mechanics, Decreased mobility, Decreased strength, Postural dysfunction  Visit Diagnosis: Bilateral low back pain, with sciatica presence unspecified  Muscle weakness (generalized)     Problem List There are no active problems to display for this patient.   Yong Grieser C. Nevae Pinnix PT, DPT 01/20/2016 9:39 AM   [facility] [address] [place], [place], [address] Phone: [phone]   Fax:  [phone]  Name: Elaine Rivera MRN: [id number] Date of Birth: [birth year]

## 2016-01-22 ENCOUNTER — Ambulatory Visit: Payer: Medicare Other | Admitting: Physical Therapy

## 2016-01-27 ENCOUNTER — Ambulatory Visit: Payer: Medicare Other | Admitting: Physical Therapy

## 2016-01-29 ENCOUNTER — Ambulatory Visit: Payer: Medicare Other | Admitting: Physical Therapy

## 2016-02-04 ENCOUNTER — Ambulatory Visit: Payer: Medicare Other | Attending: Neurosurgery | Admitting: Physical Therapy

## 2016-02-04 ENCOUNTER — Encounter: Payer: Self-pay | Admitting: Physical Therapy

## 2016-02-04 DIAGNOSIS — M6281 Muscle weakness (generalized): Secondary | ICD-10-CM | POA: Diagnosis present

## 2016-02-04 DIAGNOSIS — M545 Low back pain: Secondary | ICD-10-CM | POA: Insufficient documentation

## 2016-02-04 NOTE — Therapy (Addendum)
[place], [place], [address] Phone: [phone]   Fax:  [phone]  Physical Therapy Treatment/Discharge Summary  Patient Details  Name: Elaine Rivera MRN: [id number] Date of Birth: 1946-04-06 Referring Provider: Almon Register, MD  Encounter Date: 02/04/2016      PT End of Session - 02/04/16 1344    Visit Number 3   Number of Visits 13   Date for PT Re-Evaluation 02/27/16   Authorization Type U[employer] MCR  Kx at 15th visit   PT Start Time 1339   PT Stop Time 1425   PT Time Calculation (min) 46 min   Activity Tolerance Patient tolerated treatment well   Behavior During Therapy [facility] for tasks assessed/performed      Past Medical History  Diagnosis Date  . Hypertension   . Basal cell carcinoma of eye     biopsy of left eye/ non cancerous  . Kidney stones     twice/ [place]  . Depression     Past Surgical History  Procedure Laterality Date  . Ectopic pregnancy surgery      x2   . Vaginal hysterectomy      2001  . Cerebral aneurysm repair      1998  . Wrist surgery      left side/2008  . Wrist surgery      right side/ 2002  . Tonsillectomy      1953  . Parathyroidectomy      2002  . Vocal cord lateralization, endoscopic approach w/ mlb      2007 (_[facility] )    There were no vitals filed for this visit.      Subjective Assessment - 02/04/16 1343    Subjective Reports the weather and being up for a while so far today has resulted in increased pain.    Currently in Pain? Yes   Pain Score 7                          [facility] Adult PT Treatment/Exercise - 02/04/16 0001    Lumbar Exercises: Seated   Other Seated Lumbar Exercises seated dead bug 3x10   Lumbar Exercises: Supine   Ab Set Other (comment)  seated   Knee/Hip Exercises: Aerobic   Nustep 10 min  L5   Knee/Hip Exercises: Seated   Clamshell with TheraBand Red  3x10   Shoulder Exercises: Seated   Abduction Theraband   Theraband Level (Shoulder ABduction) Level 1 (Yellow)   ABduction Limitations 2x10   Other Seated Exercises seated press ups                PT Education - 02/04/16 1432    Education provided Yes   Education Details exercise form and rationale; exercise and postural endurance.    Person(s) Educated Patient   Methods Explanation;Demonstration;Tactile cues;Verbal cues;Handout   Comprehension Verbalized understanding;Returned demonstration;Verbal cues required;Tactile cues required;Need further instruction          PT Short Term Goals - 01/16/16 1535    PT SHORT TERM GOAL #1   Title Pt will be independent with HEP as it is so far established by 5/11   Time 3   Period Weeks   Status New   PT SHORT TERM GOAL #2   Title Patient will report joining gym that she will be continuuing at by 5/11   Time 3   Period Weeks   Status New  PT Long Term Goals - 01/16/16 1536    PT LONG TERM GOAL #1   Title Patient will verbalize feeling of improved endurance and strength by 6/1   Time 6   Period Weeks   Status New   PT LONG TERM GOAL #2   Title patient will report average pain <4/10 by 6/1   Time 6   Period Weeks   Status New   PT LONG TERM GOAL #3   Title LE MMT all to 4/5 without back pain by 6/1   Time 6   Period Weeks   Status New   PT LONG TERM GOAL #4   Title Patient will be able to demonstrate appropraite contraction of transverse abdominis with all activities with no to minimal cuing by 6/1    Time 6   Period Weeks   Status New   PT LONG TERM GOAL #5   Title FOTO to 49% ability   Time 6   Period Weeks   Status New               Plan - 02/04/16 1433    Clinical Impression Statement Pt required frequent VC for posture. Tendency to shift weight into R hip when fatigued and had difficulty breathing during exercises.    PT Next Visit Plan core and hip strengthening exercises within tolerance of back. Increase standing  exercises   PT Home Exercise Plan seated hip abd and adduction, seated dead bug, seated UE horizontal abduction, seated press ups, seated pelvic tilts.    Consulted and Agree with Plan of Care Patient      Patient will benefit from skilled therapeutic intervention in order to improve the following deficits and impairments:  Decreased range of motion, Difficulty walking, Pain, Decreased activity tolerance, Decreased endurance, Improper body mechanics, Decreased mobility, Decreased strength, Postural dysfunction  Visit Diagnosis: Bilateral low back pain, with sciatica presence unspecified  Muscle weakness (generalized)     Problem List There are no active problems to display for this patient.  Carlisle Enke C. Merrin Mcvicker PT, DPT 02/04/2016 2:37 PM   [facility] [address] [place], [place], [address] Phone: [phone]   Fax:  [phone]  Name: HONG TIMM MRN: [id number] Date of Birth: 08-27-1946   PHYSICAL THERAPY DISCHARGE SUMMARY  Visits from Start of Care: 3  Current functional level related to goals / functional outcomes: See above   Remaining deficits: See above   Education / Equipment: Anatomy of condtion, POC, HEP, exercise form/rationale  Plan: Patient agrees to discharge.  Patient goals were not met. Patient is being discharged due to not returning since the last visit.  ?????    Davie Claud C. Umi Mainor PT, DPT 05/07/16 2:52 PM

## 2016-02-06 ENCOUNTER — Ambulatory Visit: Payer: Medicare Other | Admitting: Physical Therapy

## 2016-02-17 ENCOUNTER — Ambulatory Visit: Payer: Medicare Other | Admitting: Physical Therapy

## 2016-02-19 ENCOUNTER — Ambulatory Visit: Payer: Medicare Other | Admitting: Physical Therapy

## 2016-04-20 ENCOUNTER — Encounter (HOSPITAL_COMMUNITY): Payer: Self-pay | Admitting: Emergency Medicine

## 2016-04-20 ENCOUNTER — Observation Stay (HOSPITAL_COMMUNITY)
Admission: EM | Admit: 2016-04-20 | Discharge: 2016-04-23 | Disposition: A | Discharge status: Home / Self Care | Payer: Medicare Other | Attending: Internal Medicine | Admitting: Internal Medicine

## 2016-04-20 DIAGNOSIS — R000 Tachycardia, unspecified: Secondary | ICD-10-CM | POA: Diagnosis not present

## 2016-04-20 DIAGNOSIS — D649 Anemia, unspecified: Secondary | ICD-10-CM | POA: Diagnosis not present

## 2016-04-20 DIAGNOSIS — I10 Essential (primary) hypertension: Secondary | ICD-10-CM | POA: Diagnosis not present

## 2016-04-20 DIAGNOSIS — Z85828 Personal history of other malignant neoplasm of skin: Secondary | ICD-10-CM | POA: Diagnosis not present

## 2016-04-20 DIAGNOSIS — K449 Diaphragmatic hernia without obstruction or gangrene: Secondary | ICD-10-CM | POA: Diagnosis not present

## 2016-04-20 DIAGNOSIS — F329 Major depressive disorder, single episode, unspecified: Secondary | ICD-10-CM | POA: Diagnosis not present

## 2016-04-20 DIAGNOSIS — K219 Gastro-esophageal reflux disease without esophagitis: Secondary | ICD-10-CM | POA: Diagnosis not present

## 2016-04-20 DIAGNOSIS — R195 Other fecal abnormalities: Secondary | ICD-10-CM | POA: Diagnosis present

## 2016-04-20 DIAGNOSIS — G8929 Other chronic pain: Secondary | ICD-10-CM | POA: Diagnosis not present

## 2016-04-20 DIAGNOSIS — K210 Gastro-esophageal reflux disease with esophagitis: Secondary | ICD-10-CM | POA: Insufficient documentation

## 2016-04-20 DIAGNOSIS — Z791 Long term (current) use of non-steroidal anti-inflammatories (NSAID): Secondary | ICD-10-CM | POA: Diagnosis not present

## 2016-04-20 DIAGNOSIS — D509 Iron deficiency anemia, unspecified: Principal | ICD-10-CM | POA: Insufficient documentation

## 2016-04-20 DIAGNOSIS — M419 Scoliosis, unspecified: Secondary | ICD-10-CM | POA: Diagnosis present

## 2016-04-20 DIAGNOSIS — R531 Weakness: Secondary | ICD-10-CM | POA: Diagnosis present

## 2016-04-20 DIAGNOSIS — Z79899 Other long term (current) drug therapy: Secondary | ICD-10-CM | POA: Diagnosis not present

## 2016-04-20 DIAGNOSIS — F32A Depression, unspecified: Secondary | ICD-10-CM

## 2016-04-20 DIAGNOSIS — M549 Dorsalgia, unspecified: Secondary | ICD-10-CM | POA: Diagnosis not present

## 2016-04-20 DIAGNOSIS — R5383 Other fatigue: Secondary | ICD-10-CM

## 2016-04-20 DIAGNOSIS — F419 Anxiety disorder, unspecified: Secondary | ICD-10-CM | POA: Insufficient documentation

## 2016-04-20 HISTORY — DX: Adverse effect of unspecified anesthetic, initial encounter: T41.45XA

## 2016-04-20 HISTORY — DX: Other complications of anesthesia, initial encounter: T88.59XA

## 2016-04-20 LAB — CBC WITH DIFFERENTIAL/PLATELET
BASOS ABS: 0 10*3/uL (ref 0.0–0.1)
BASOS PCT: 0 %
Eosinophils Absolute: 0.1 10*3/uL (ref 0.0–0.7)
Eosinophils Relative: 1 %
HEMATOCRIT: 19.5 % — AB (ref 36.0–46.0)
HEMOGLOBIN: 5.1 g/dL — AB (ref 12.0–15.0)
LYMPHS PCT: 13 %
Lymphs Abs: 1 10*3/uL (ref 0.7–4.0)
MCH: 16.5 pg — ABNORMAL LOW (ref 26.0–34.0)
MCHC: 26.2 g/dL — ABNORMAL LOW (ref 30.0–36.0)
MCV: 63.1 fL — ABNORMAL LOW (ref 78.0–100.0)
Monocytes Absolute: 0.9 10*3/uL (ref 0.1–1.0)
Monocytes Relative: 11 %
NEUTROS ABS: 6 10*3/uL (ref 1.7–7.7)
Neutrophils Relative %: 75 %
PLATELETS: 401 10*3/uL — AB (ref 150–400)
RBC: 3.09 MIL/uL — AB (ref 3.87–5.11)
RDW: 19.6 % — AB (ref 11.5–15.5)
WBC: 8 10*3/uL (ref 4.0–10.5)

## 2016-04-20 LAB — RETICULOCYTES
RBC.: 3.09 MIL/uL — AB (ref 3.87–5.11)
RETIC COUNT ABSOLUTE: 46.4 10*3/uL (ref 19.0–186.0)
RETIC CT PCT: 1.5 % (ref 0.4–3.1)

## 2016-04-20 LAB — COMPREHENSIVE METABOLIC PANEL
ALT: 16 U/L (ref 14–54)
ANION GAP: 7 (ref 5–15)
AST: 20 U/L (ref 15–41)
Albumin: 4 g/dL (ref 3.5–5.0)
Alkaline Phosphatase: 75 U/L (ref 38–126)
BUN: 18 mg/dL (ref 6–20)
CHLORIDE: 103 mmol/L (ref 101–111)
CO2: 27 mmol/L (ref 22–32)
Calcium: 9 mg/dL (ref 8.9–10.3)
Creatinine, Ser: 0.77 mg/dL (ref 0.44–1.00)
Glucose, Bld: 112 mg/dL — ABNORMAL HIGH (ref 65–99)
POTASSIUM: 2.7 mmol/L — AB (ref 3.5–5.1)
SODIUM: 137 mmol/L (ref 135–145)
Total Bilirubin: 0.6 mg/dL (ref 0.3–1.2)
Total Protein: 6.5 g/dL (ref 6.5–8.1)

## 2016-04-20 LAB — POC OCCULT BLOOD, ED: FECAL OCCULT BLD: POSITIVE — AB

## 2016-04-20 LAB — ABO/RH: ABO/RH(D): O POS

## 2016-04-20 LAB — PREPARE RBC (CROSSMATCH)

## 2016-04-20 MED ORDER — SODIUM CHLORIDE 0.9 % IV BOLUS (SEPSIS): 1000.0000 mL | Freq: Once | INTRAVENOUS | Status: DC

## 2016-04-20 MED ORDER — ACETAMINOPHEN 325 MG PO TABS: 650.0000 mg | ORAL_TABLET | Freq: Four times a day (QID) | ORAL | Status: DC | PRN

## 2016-04-20 MED ORDER — MAGNESIUM SULFATE 2 GM/50ML IV SOLN
2.0000 g | Freq: Once | INTRAVENOUS | Status: AC
  Administered 2016-04-21: 2 g via INTRAVENOUS
  Filled 2016-04-20: qty 50

## 2016-04-20 MED ORDER — POTASSIUM CHLORIDE CRYS ER 20 MEQ PO TBCR
40.0000 meq | EXTENDED_RELEASE_TABLET | ORAL | Status: AC
  Administered 2016-04-21 (×2): 40 meq via ORAL
  Filled 2016-04-20 (×2): qty 2

## 2016-04-20 MED ORDER — MORPHINE SULFATE (PF) 2 MG/ML IV SOLN: 1.0000 mg | INTRAVENOUS | Status: DC | PRN

## 2016-04-20 MED ORDER — QUETIAPINE FUMARATE 50 MG PO TABS
25.0000 mg | ORAL_TABLET | Freq: Every day | ORAL | Status: DC
  Administered 2016-04-21 – 2016-04-22 (×3): 25 mg via ORAL
  Filled 2016-04-20 (×3): qty 1

## 2016-04-20 MED ORDER — ALPRAZOLAM 0.5 MG PO TABS
0.5000 mg | ORAL_TABLET | Freq: Every day | ORAL | Status: DC | PRN
  Administered 2016-04-22 (×2): 0.5 mg via ORAL
  Filled 2016-04-20 (×2): qty 1

## 2016-04-20 MED ORDER — SODIUM CHLORIDE 0.9% FLUSH
3.0000 mL | Freq: Two times a day (BID) | INTRAVENOUS | Status: DC
  Administered 2016-04-21 – 2016-04-22 (×4): 3 mL via INTRAVENOUS

## 2016-04-20 MED ORDER — SODIUM CHLORIDE 0.9 % IV SOLN
Freq: Once | INTRAVENOUS | Status: DC
  Administered 2016-04-21: 01:00:00 via INTRAVENOUS

## 2016-04-20 MED ORDER — POTASSIUM CHLORIDE 10 MEQ/100ML IV SOLN
10.0000 meq | INTRAVENOUS | Status: AC
  Administered 2016-04-21 (×2): 10 meq via INTRAVENOUS
  Filled 2016-04-20 (×2): qty 100

## 2016-04-20 MED ORDER — ONDANSETRON HCL 4 MG/2ML IJ SOLN: 4.0000 mg | Freq: Four times a day (QID) | INTRAMUSCULAR | Status: DC | PRN

## 2016-04-20 MED ORDER — ONDANSETRON HCL 4 MG PO TABS: 4.0000 mg | ORAL_TABLET | Freq: Four times a day (QID) | ORAL | Status: DC | PRN

## 2016-04-20 MED ORDER — DULOXETINE HCL 30 MG PO CPEP
60.0000 mg | ORAL_CAPSULE | Freq: Two times a day (BID) | ORAL | Status: DC
  Administered 2016-04-21: 60 mg via ORAL
  Filled 2016-04-20 (×2): qty 2

## 2016-04-20 MED ORDER — ACETAMINOPHEN 650 MG RE SUPP: 650.0000 mg | Freq: Four times a day (QID) | RECTAL | Status: DC | PRN

## 2016-04-20 MED ORDER — HYDROCODONE-ACETAMINOPHEN 5-325 MG PO TABS
1.0000 | ORAL_TABLET | ORAL | Status: DC | PRN
Start: 2016-04-20 — End: 2016-04-23

## 2016-04-20 MED ORDER — PANTOPRAZOLE SODIUM 40 MG IV SOLR
40.0000 mg | Freq: Two times a day (BID) | INTRAVENOUS | Status: DC
  Administered 2016-04-21 (×2): 40 mg via INTRAVENOUS
  Filled 2016-04-20 (×2): qty 40

## 2016-04-20 NOTE — ED Provider Notes (Signed)
[facility] DEPT Provider Note   CSN: [id number] Arrival date & time: 04/20/16  [id number]  First Provider Contact:  First MD Initiated Contact with Patient 04/20/16 2028        History   Chief Complaint Chief Complaint  Patient presents with  . critical lab    HPI Elaine Rivera is a 70 y.o. female.  70 year old female with a history of hypertension, depression, scoliosis and esotropia presents to the emergency department today secondary to likely anemia. Has had a couple weeks of progressive worsening fatigue, lightheadedness when walking. Get routine labs done for presurgical planning and was told that her "blood count dropped from 12-6" and sent here for evaluation. Daughter states that the patient has lost a couple pounds over the last week and has probably lost more last couple months. Also has decreased appetite secondary to her depression. No history of anemia. She has had darker stools than normal recently. She has been up-to-date on colonoscopy and whatever other tests for physician has asked for her to do, most recently it was Hemoccult.      Past Medical History:  Diagnosis Date  . Basal cell carcinoma of eye    biopsy of left eye/ non cancerous  . Depression   . Hypertension   . Kidney stones    twice/ [place]    Patient Active Problem List   Diagnosis Date Noted  . Symptomatic anemia 04/20/2016  . NSAID long-term use 04/20/2016  . Scoliosis 04/20/2016  . Depression 04/20/2016  . HTN (hypertension) 04/20/2016  . GERD (gastroesophageal reflux disease) 04/20/2016  . Occult GI bleeding 04/20/2016    Past Surgical History:  Procedure Laterality Date  . CEREBRAL ANEURYSM REPAIR     1998  . ECTOPIC PREGNANCY SURGERY     x2   . PARATHYROIDECTOMY     2002  . TONSILLECTOMY     1953  . VAGINAL HYSTERECTOMY     2001  . VOCAL CORD LATERALIZATION, ENDOSCOPIC APPROACH W/ MLB     2007 (@[facility] )  . WRIST SURGERY     left side/2008  . WRIST SURGERY     right side/ 2002    OB History    No data available       Home Medications    Prior to Admission medications   Medication Sig Start Date End Date Taking? Authorizing Provider  acyclovir (ZOVIRAX) 400 MG tablet Take 400 mg by mouth 3 (three) times daily as needed (for cold sores).    Yes Historical Provider, MD  ALPRAZolam Duanne Moron) 0.5 MG tablet Take 0.5 mg by mouth daily as needed for anxiety.   Yes Historical Provider, MD  amLODipine (NORVASC) 10 MG tablet Take 10 mg by mouth at bedtime.    Yes Historical Provider, MD  DULoxetine (CYMBALTA) 60 MG capsule Take 60 mg by mouth 2 (two) times daily.    Yes Historical Provider, MD  naproxen sodium (ANAPROX) 220 MG tablet Take 660 mg by mouth 2 (two) times daily as needed (for pain).   Yes Historical Provider, MD  QUEtiapine (SEROQUEL) 25 MG tablet Take 25 mg by mouth at bedtime.   Yes Historical Provider, MD    Family History No family history on file.  Social History Social History  Substance Use Topics  . Smoking status: Never Smoker  . Smokeless tobacco: Never Used  . Alcohol use No     Allergies   Sulfa antibiotics   Review of Systems Review of Systems  Constitutional: Positive for  activity change, fatigue and unexpected weight change. Negative for fever.  Respiratory: Negative for shortness of breath.   Cardiovascular: Negative for chest pain.  Gastrointestinal: Positive for abdominal pain (intermittent right flank).  Endocrine: Negative for polydipsia and polyuria.  Genitourinary: Negative for difficulty urinating, dyspareunia and dysuria.  Musculoskeletal: Negative for back pain and neck pain.  Neurological: Positive for dizziness and weakness.  All other systems reviewed and are negative.    Physical Exam Updated Vital Signs BP 120/63 (BP Location: Right Arm)   Pulse (!) 108   Temp 98.6 F (37 C) (Oral)   Resp 20   Ht 4\' 11"  (1.499 m)   Wt 123 lb 3.8 oz (55.9 kg)   SpO2 95%   BMI 24.89 kg/m   Physical  Exam  Constitutional: She is oriented to person, place, and time. She appears well-developed and well-nourished.  HENT:  Head: Normocephalic and atraumatic.  Eyes: EOM are normal. Pupils are equal, round, and reactive to light.  Pale conjunctiva  Neck: Normal range of motion.  Cardiovascular: Regular rhythm.  Tachycardia present.   Pulmonary/Chest: Effort normal and breath sounds normal. No stridor. No respiratory distress.  Abdominal: Soft. Bowel sounds are normal. She exhibits no distension. There is no tenderness.  Musculoskeletal: Normal range of motion. She exhibits no edema or deformity.  Neurological: She is alert and oriented to person, place, and time.  Skin: Skin is warm and dry. There is pallor.  Nursing note and vitals reviewed.    ED Treatments / Results  Labs (all labs ordered are listed, but only abnormal results are displayed) Labs Reviewed  CBC WITH DIFFERENTIAL/PLATELET - Abnormal; Notable for the following:       Result Value   RBC 3.09 (*)    Hemoglobin 5.1 (*)    HCT 19.5 (*)    MCV 63.1 (*)    MCH 16.5 (*)    MCHC 26.2 (*)    RDW 19.6 (*)    Platelets 401 (*)    All other components within normal limits  COMPREHENSIVE METABOLIC PANEL - Abnormal; Notable for the following:    Potassium 2.7 (*)    Glucose, Bld 112 (*)    All other components within normal limits  RETICULOCYTES - Abnormal; Notable for the following:    RBC. 3.09 (*)    All other components within normal limits  POC OCCULT BLOOD, ED - Abnormal; Notable for the following:    Fecal Occult Bld POSITIVE (*)    All other components within normal limits  MRSA PCR SCREENING  TSH  CBC  BASIC METABOLIC PANEL  PROTIME-INR  APTT  FERRITIN  FOLATE  IRON AND TIBC  VITAMIN B12  POC OCCULT BLOOD, ED  TYPE AND SCREEN  ABO/RH  PREPARE RBC (CROSSMATCH)    EKG  EKG Interpretation None       Radiology No results found.  Procedures Procedures (including critical care time)  CRITICAL  CARE Performed by: Merrily Pew Total critical care time: 35 minutes Critical care time was exclusive of separately billable procedures and treating other patients. Critical care was necessary to treat or prevent imminent or life-threatening deterioration. Critical care was time spent personally by me on the following activities: development of treatment plan with patient and/or surrogate as well as nursing, discussions with consultants, evaluation of patient's response to treatment, examination of patient, obtaining history from patient or surrogate, ordering and performing treatments and interventions, ordering and review of laboratory studies, ordering and review of radiographic studies, pulse  oximetry and re-evaluation of patient's condition.   Medications Ordered in ED Medications  ALPRAZolam (XANAX) tablet 0.5 mg (not administered)  QUEtiapine (SEROQUEL) tablet 25 mg (25 mg Oral Given 04/21/16 0027)  DULoxetine (CYMBALTA) DR capsule 60 mg (60 mg Oral Given 04/21/16 0026)  sodium chloride flush (NS) 0.9 % injection 3 mL (3 mLs Intravenous Given 04/21/16 0029)  acetaminophen (TYLENOL) tablet 650 mg (not administered)    Or  acetaminophen (TYLENOL) suppository 650 mg (not administered)  HYDROcodone-acetaminophen (NORCO/VICODIN) 5-325 MG per tablet 1-2 tablet (not administered)  morphine 2 MG/ML injection 1 mg (not administered)  ondansetron (ZOFRAN) tablet 4 mg (not administered)    Or  ondansetron (ZOFRAN) injection 4 mg (not administered)  potassium chloride SA (K-DUR,KLOR-CON) CR tablet 40 mEq (40 mEq Oral Given 04/21/16 0026)  potassium chloride 10 mEq in 100 mL IVPB (10 mEq Intravenous Given 04/21/16 0029)  magnesium sulfate IVPB 2 g 50 mL (2 g Intravenous Given 04/21/16 0029)  pantoprazole (PROTONIX) injection 40 mg (40 mg Intravenous Given 04/21/16 0042)     Initial Impression / Assessment and Plan / ED Course  I have reviewed the triage vital signs and the nursing  notes.  Pertinent labs & imaging results that were available during my care of the patient were reviewed by me and considered in my medical decision making (see chart for details).  Symptomatic anemia. Likely GI (dark stools recently), will eval appropriately and likely needs admission.   Clinical Course  Comment By Time  Hemoglobin of 5.1, K of 2.7. Retic not appropriate. Suspect aplastic anemia. Will transfuse and consult medicine for admission.  Merrily Pew, MD 07/24 2210      Final Clinical Impressions(s) / ED Diagnoses   Final diagnoses:  Anemia, unspecified anemia type  Tachycardia  Weakness  Other fatigue    New Prescriptions Current Discharge Medication List       Merrily Pew, MD 04/21/16 [phone]

## 2016-04-20 NOTE — H&P (Signed)
History and Physical    Elaine Rivera [id number] DOB: [birth year] DOA: 04/20/2016  PCP: Dr. Jeremy Johann  Patient coming from: Home  Chief Complaint: Told to come to the ED for management of abnormal labs, history of fatigue, dizziness, DOE  HPI: Elaine Rivera is a 70 y.o. woman with a history of depression, scoliosis, significant GERD, and HTN who presents to the ED after being advised of critical labs drawn in her PCP's office earlier today.  She reported for a complete physical and blood work to be done as pre-op screening before being referred to [facility] for [facility] surgery.  While there, she reported increased fatigue (her family says it's been present for at least one year, worse in the past 1-2 months, she will stay in bed for days at a time), dyspnea on exertion (she can get short of breath just walking across the room in her house), and dizziness.  No chest pain but she complains of epigastric pain which she associated with a history of severe GERD/acid indigestion.  She has chronic back pain related to scoliosis, and has taken Aleve for years.  She takes it as needed, just took 3 tablets yesterday (without food).  She reports "dark stools", but seems to be unsure it has been true melena.  She does not take iron or pepto bismol at baseline.  She denies hematemesis or coffee ground emesis.  No BRBPR.  Hemoglobin found to be 5 today.  Reportedly, it was 12 last year (records here jump back to 2008).  She has a history of anemia as a young woman, and has a remote history of iron supplement use.  She is S/P hysterectomy.  She reports that anemia has not been a problem again until now.  ED Course: Hemoglobin confirmed to be near 5.  WBC and platelet count normal.  Potassium 2.7.  She has received a 1L NS bolus.  Transfusion of 2 units of PRBCs pending.  Review of Systems: As per HPI otherwise 10 point review of systems negative.    Past Medical History:  Diagnosis Date  . Basal cell  carcinoma of eye    biopsy of left eye/ non cancerous  . Depression   . Hypertension   . Kidney stones    twice/ [place]    Past Surgical History:  Procedure Laterality Date  . CEREBRAL ANEURYSM REPAIR     1998  . ECTOPIC PREGNANCY SURGERY     x2   . PARATHYROIDECTOMY     2002  . TONSILLECTOMY     1953  . VAGINAL HYSTERECTOMY     2001  . VOCAL CORD LATERALIZATION, ENDOSCOPIC APPROACH W/ MLB     2007 (@[facility] )  . WRIST SURGERY     left side/2008  . WRIST SURGERY     right side/ 2002     reports that she has never smoked. She has never used smokeless tobacco. She reports that she does not drink alcohol. Her drug history is not on file.  No history of illicit drug use.  She is not married.  She has one daughter.  Allergies  Allergen Reactions  . Sulfa Antibiotics Other (See Comments)    Reaction:  Unknown     FAMILY HISTORY: Father is deceased.  He had a history of DM and CAD, MI. Mother is deceased.  She had a history of metastatic breast cancer. No known family history of GI malignancy.  Prior to Admission medications   Medication Sig Start  Date End Date Taking? Authorizing Provider  acyclovir (ZOVIRAX) 400 MG tablet Take 400 mg by mouth 3 (three) times daily as needed (for cold sores).    Yes Historical Provider, MD  ALPRAZolam Duanne Moron) 0.5 MG tablet Take 0.5 mg by mouth daily as needed for anxiety.   Yes Historical Provider, MD  amLODipine (NORVASC) 10 MG tablet Take 10 mg by mouth at bedtime.    Yes Historical Provider, MD  DULoxetine (CYMBALTA) 60 MG capsule Take 60 mg by mouth 2 (two) times daily.    Yes Historical Provider, MD  naproxen sodium (ANAPROX) 220 MG tablet Take 660 mg by mouth 2 (two) times daily as needed (for pain).   Yes Historical Provider, MD  QUEtiapine (SEROQUEL) 25 MG tablet Take 25 mg by mouth at bedtime.   Yes Historical Provider, MD    Physical Exam: Vitals:   04/20/16 2145 04/20/16 2200 04/20/16 2215 04/20/16 2322  BP:    124/76    Pulse: 105   106  Resp: 17 17 23 22   Temp:      TempSrc:      SpO2: 95%   96%  Weight:      Height:          Constitutional: NAD, calm, comfortable, but PALE and has a tremor Vitals:   04/20/16 2145 04/20/16 2200 04/20/16 2215 04/20/16 2322  BP:    124/76  Pulse: 105   106  Resp: 17 17 23 22   Temp:      TempSrc:      SpO2: 95%   96%  Weight:      Height:       Eyes: PERRL, lids and conjunctivae normal ENMT: Mucous membranes are moist. Posterior pharynx clear of any exudate or lesions. Normal dentition.  Neck: normal appearance, supple Respiratory: Bibasilar crackles.  No wheezing.  Normal respiratory effort. No accessory muscle use.  Cardiovascular: Mildly tachycardic but regular.  No murmurs / rubs / gallops. No extremity edema. 2+ pedal pulses. GI: abdomen is soft and compressible.  No distention.  No tenderness.  No guarding.  No masses palpated.  Bowel sounds are present. Musculoskeletal:  No joint deformity in upper and lower extremities. Marked scoliosis of thoracic spine noted.  Good ROM, no contractures. Normal muscle tone.  Skin: no rashes, warm and dry, PALE Neurologic: No focal deficits. Psychiatric: Normal judgment and insight. Alert and oriented x 3. Normal mood.     Labs on Admission: I have personally reviewed following labs and imaging studies  CBC:  Recent Labs Lab 04/20/16 2115  WBC 8.0  NEUTROABS 6.0  HGB 5.1*  HCT 19.5*  MCV 63.1*  PLT 123XX123*   Basic Metabolic Panel:  Recent Labs Lab 04/20/16 2115  NA 137  K 2.7*  CL 103  CO2 27  GLUCOSE 112*  BUN 18  CREATININE 0.77  CALCIUM 9.0   GFR: Estimated Creatinine Clearance: 49.2 mL/min (by C-G formula based on SCr of 0.8 mg/dL). Liver Function Tests:  Recent Labs Lab 04/20/16 2115  AST 20  ALT 16  ALKPHOS 75  BILITOT 0.6  PROT 6.5  ALBUMIN 4.0   Anemia Panel:  Recent Labs  04/20/16 2115  RETICCTPCT 1.5    EKG: Independently reviewed. Sinus tachycardia.  No acute ST  segment changes.  Assessment/Plan Principal Problem:   Symptomatic anemia Active Problems:   NSAID long-term use   Scoliosis   Depression   HTN (hypertension)   GERD (gastroesophageal reflux disease)   Occult GI bleeding  Symptomatic anemia, history of GERD and heavy NSAID use, dark stools (heme occult pending), followed by Dr. Collene Mares --Admit to the stepdown unit --Anemia panel and stool occult pending --Protonix 40mg  IV q12g --Transfuse two units of PRBCs as ordered in the ED; she may need more --NPO after midnight for possible EGD in the AM --Unable to reach Dr. Collene Mares at time of admission.  Will plan to discuss with [facility].  Patient has a history of vocal cord paralysis and has concerns about EGD. --Avoid anticoagulants  History of HTN --Hold amlodipine for now and monitor hemodynamic stability.  Can resume for accelerated blood pressures.  Depression Continue Xanax, Cymbalta, Seroquel  Chronic back pain secondary to scoliosis --Narcotic analgesics prn for now    DVT prophylaxis: SCDs, suspect occult GI blood losses Code Status: FULL Family Communication: Daughter and friend at bedside in the ED at time of admission Disposition Plan: Expect her to go home when ready Consults called: Attempting to secure GI consult tonight Admission status: Observation, stepdown unit   TIME SPENT: 60 minutes   Eber Jones MD [facility] Pager [phone]  If 7PM-7AM, please contact night-coverage [email or web address] Password [facility]  04/20/2016, 11:25 PM

## 2016-04-20 NOTE — ED Triage Notes (Signed)
Pt here from her PCP following lab work. Pt states she was told to come because one of her lab values dropped from 12 -6. Pt thinks this may have been her hemoglobin. Pt denies pain anywhere, but states she has been very cold lately and has had been extremely fatigued for the past week and has hardly left bed. Pt states she has been dizzy recently as well. Pt denies any blood loss that she is aware of in her stool or emesis. Pt denies taking blood thinners.

## 2016-04-21 ENCOUNTER — Encounter (HOSPITAL_COMMUNITY): Payer: Self-pay

## 2016-04-21 DIAGNOSIS — K219 Gastro-esophageal reflux disease without esophagitis: Secondary | ICD-10-CM | POA: Diagnosis not present

## 2016-04-21 DIAGNOSIS — D649 Anemia, unspecified: Secondary | ICD-10-CM | POA: Diagnosis not present

## 2016-04-21 DIAGNOSIS — F329 Major depressive disorder, single episode, unspecified: Secondary | ICD-10-CM | POA: Diagnosis not present

## 2016-04-21 DIAGNOSIS — I10 Essential (primary) hypertension: Secondary | ICD-10-CM | POA: Diagnosis not present

## 2016-04-21 LAB — CBC
HCT: 26.5 % — ABNORMAL LOW (ref 36.0–46.0)
HEMOGLOBIN: 7.9 g/dL — AB (ref 12.0–15.0)
MCH: 20.5 pg — ABNORMAL LOW (ref 26.0–34.0)
MCHC: 29.8 g/dL — ABNORMAL LOW (ref 30.0–36.0)
MCV: 68.8 fL — ABNORMAL LOW (ref 78.0–100.0)
PLATELETS: 318 10*3/uL (ref 150–400)
RBC: 3.85 MIL/uL — AB (ref 3.87–5.11)
RDW: 23.5 % — ABNORMAL HIGH (ref 11.5–15.5)
WBC: 5.5 10*3/uL (ref 4.0–10.5)

## 2016-04-21 LAB — BASIC METABOLIC PANEL
Anion gap: 4 — ABNORMAL LOW (ref 5–15)
BUN: 11 mg/dL (ref 6–20)
CHLORIDE: 112 mmol/L — AB (ref 101–111)
CO2: 25 mmol/L (ref 22–32)
Calcium: 8.4 mg/dL — ABNORMAL LOW (ref 8.9–10.3)
Creatinine, Ser: 0.54 mg/dL (ref 0.44–1.00)
Glucose, Bld: 92 mg/dL (ref 65–99)
POTASSIUM: 3.8 mmol/L (ref 3.5–5.1)
SODIUM: 141 mmol/L (ref 135–145)

## 2016-04-21 LAB — IRON AND TIBC
Iron: 13 ug/dL — ABNORMAL LOW (ref 28–170)
SATURATION RATIOS: 3 % — AB (ref 10.4–31.8)
TIBC: 424 ug/dL (ref 250–450)
UIBC: 411 ug/dL

## 2016-04-21 LAB — VITAMIN B12: VITAMIN B 12: 1003 pg/mL — AB (ref 180–914)

## 2016-04-21 LAB — PROTIME-INR
INR: 1.06 (ref 0.00–1.49)
PROTHROMBIN TIME: 14 s (ref 11.6–15.2)

## 2016-04-21 LAB — MRSA PCR SCREENING: MRSA BY PCR: NEGATIVE

## 2016-04-21 LAB — FERRITIN: FERRITIN: 3 ng/mL — AB (ref 11–307)

## 2016-04-21 LAB — FOLATE: Folate: 11 ng/mL (ref >5.9)

## 2016-04-21 LAB — APTT: aPTT: 29 seconds (ref 24–37)

## 2016-04-21 LAB — TSH: TSH: 3.719 u[IU]/mL (ref 0.350–4.500)

## 2016-04-21 MED ORDER — PANTOPRAZOLE SODIUM 40 MG PO TBEC
40.0000 mg | DELAYED_RELEASE_TABLET | Freq: Every day | ORAL | Status: DC
  Administered 2016-04-21 – 2016-04-23 (×2): 40 mg via ORAL
  Filled 2016-04-21 (×2): qty 1

## 2016-04-21 MED ORDER — DULOXETINE HCL 60 MG PO CPEP
120.0000 mg | ORAL_CAPSULE | Freq: Every day | ORAL | Status: DC
  Administered 2016-04-21 – 2016-04-22 (×2): 120 mg via ORAL
  Filled 2016-04-21: qty 2
  Filled 2016-04-21: qty 4

## 2016-04-21 MED ORDER — BOOST / RESOURCE BREEZE PO LIQD
1.0000 | Freq: Three times a day (TID) | ORAL | Status: DC
  Administered 2016-04-21 – 2016-04-22 (×2): 1 via ORAL

## 2016-04-21 MED ORDER — SUCRALFATE 1 GM/10ML PO SUSP
1.0000 g | Freq: Three times a day (TID) | ORAL | Status: DC
  Administered 2016-04-21 – 2016-04-23 (×4): 1 g via ORAL
  Filled 2016-04-21 (×4): qty 10

## 2016-04-21 NOTE — Consult Note (Addendum)
Reason for Consult: Severe anemia/blood in stool. Referring Physician: Dr. Irwin Brakeman.  Elaine Rivera is an 70 y.o. female.  HPI: 70 year old white female, with multiple problems listed below, admitted with severe anemia [hemoglobin of 5.1 gm/dl yesterday] and dark stools. She was sent to the hospital from her PCP's office after she was found to be anemia on an OV done for pre-op evaluation for eye surgery at [facility] for ?strabismus. She has been taking Aleve for joint pains and over the last couple of months has been having progressive weakness with shortness of breath. Additional her reflux symptoms have worsened and she has noticed dark stools along with severe epigastric pain. She denies having any melena or hematochezia. She denies having any problems with nausea or vomiting. She has a fairly good appetite and her weight has been stable. She had a tubular adenoma removed during a colonoscopy in 2012. She had a normal colonoscopy in 2002. She recived 2 units of PRBC's on admission.   Past Medical History:  Diagnosis Date  . Basal cell carcinoma of eye    biopsy of left eye/ non cancerous  . Anxiety and depression   . Hypertension    Colonic polyps   . Kidney stones    twice/ [place]   Past Surgical History:  Procedure Laterality Date  . CEREBRAL ANEURYSM REPAIR     1998  . SURGERY FOR ECTOPIC PREGNANCY X 2     x2   . PARATHYROIDECTOMY     2002  . TONSILLECTOMY     1953  . VAGINAL HYSTERECTOMY     2001  . VOCAL CORD LATERALIZATION, ENDOSCOPIC APPROACH W/ MLB     2007 (_0 )  . WRIST SURGERY     left side/2008  . WRIST SURGERY     right side/ 2002   History reviewed. Her mother died of metastatic breast cancer at the age of 44 and her father committed suicide at the age of 76.  Social History:  reports that she has never smoked. She has never used smokeless tobacco. She reports that she does not drink alcohol. Her drug history is not on file.  Allergies:   Allergies  Allergen Reactions  . Sulfa Antibiotics Other (See Comments)    Reaction:  Unknown    Medications: I have reviewed the patient's current medications.  Results for orders placed or performed during the hospital encounter of 04/20/16 (from the past 48 hour(s))  ABO/Rh     Status: None   Collection Time: 04/20/16  9:00 PM  Result Value Ref Range   ABO/RH(D) O POS   CBC with Differential     Status: Abnormal   Collection Time: 04/20/16  9:15 PM  Result Value Ref Range   WBC 8.0 4.0 - 10.5 K/uL   RBC 3.09 (L) 3.87 - 5.11 MIL/uL   Hemoglobin 5.1 (LL) 12.0 - 15.0 g/dL    Comment: REPEATED TO VERIFY CRITICAL RESULT CALLED TO, READ BACK BY AND VERIFIED WITH: K MANGUS RN 2142 04/20/16 A NAVARRO    HCT 19.5 (L) 36.0 - 46.0 %   MCV 63.1 (L) 78.0 - 100.0 fL   MCH 16.5 (L) 26.0 - 34.0 pg   MCHC 26.2 (L) 30.0 - 36.0 g/dL   RDW 19.6 (H) 11.5 - 15.5 %   Platelets 401 (H) 150 - 400 K/uL   Neutrophils Relative % 75 %   Neutro Abs 6.0 1.7 - 7.7 K/uL   Lymphocytes Relative 13 %  Lymphs Abs 1.0 0.7 - 4.0 K/uL   Monocytes Relative 11 %   Monocytes Absolute 0.9 0.1 - 1.0 K/uL   Eosinophils Relative 1 %   Eosinophils Absolute 0.1 0.0 - 0.7 K/uL   Basophils Relative 0 %   Basophils Absolute 0.0 0.0 - 0.1 K/uL   RBC Morphology ELLIPTOCYTES   Comprehensive metabolic panel     Status: Abnormal   Collection Time: 04/20/16  9:15 PM  Result Value Ref Range   Sodium 137 135 - 145 mmol/L   Potassium 2.7 (LL) 3.5 - 5.1 mmol/L    Comment: CRITICAL RESULT CALLED TO, READ BACK BY AND VERIFIED WITH: MINGIA,K RN [id number] C[place],N    Chloride 103 101 - 111 mmol/L   CO2 27 22 - 32 mmol/L   Glucose, Bld 112 (H) 65 - 99 mg/dL   BUN 18 6 - 20 mg/dL   Creatinine, Ser 0.77 0.44 - 1.00 mg/dL   Calcium 9.0 8.9 - 10.3 mg/dL   Total Protein 6.5 6.5 - 8.1 g/dL   Albumin 4.0 3.5 - 5.0 g/dL   AST 20 15 - 41 U/L   ALT 16 14 - 54 U/L   Alkaline Phosphatase 75 38 - 126 U/L   Total Bilirubin 0.6  0.3 - 1.2 mg/dL   GFR calc non Af Amer >60 >60 mL/min   GFR calc Af Amer >60 >60 mL/min    Comment: (NOTE) The eGFR has been calculated using the CKD EPI equation. This calculation has not been validated in all clinical situations. eGFR's persistently <60 mL/min signify possible Chronic Kidney Disease.    Anion gap 7 5 - 15  Reticulocytes     Status: Abnormal   Collection Time: 04/20/16  9:15 PM  Result Value Ref Range   Retic Ct Pct 1.5 0.4 - 3.1 %   RBC. 3.09 (L) 3.87 - 5.11 MIL/uL   Retic Count, Manual 46.4 19.0 - 186.0 K/uL  Type and screen [place]     Status: None (Preliminary result)   Collection Time: 04/20/16  9:16 PM  Result Value Ref Range   ABO/RH(D) O POS    Antibody Screen NEG    Sample Expiration 04/23/2016    Unit Number [id number]    Blood Component Type RBC LR PHER2    Unit division 00    Status of Unit ISSUED    Transfusion Status OK TO TRANSFUSE    Crossmatch Result Compatible    Unit Number [id number]    Blood Component Type RBC LR PHER1    Unit division 00    Status of Unit ISSUED    Transfusion Status OK TO TRANSFUSE    Crossmatch Result Compatible   Prepare RBC     Status: None   Collection Time: 04/20/16 10:30 PM  Result Value Ref Range   Order Confirmation ORDER PROCESSED BY BLOOD BANK   POC occult blood, ED     Status: Abnormal   Collection Time: 04/20/16 11:19 PM  Result Value Ref Range   Fecal Occult Bld POSITIVE (A) NEGATIVE  MRSA PCR Screening     Status: None   Collection Time: 04/20/16 11:34 PM  Result Value Ref Range   MRSA by PCR NEGATIVE NEGATIVE    Comment:        The GeneXpert MRSA Assay (FDA approved for NASAL specimens only), is one component of a comprehensive MRSA colonization surveillance program. It is not intended to diagnose MRSA infection nor to guide or monitor treatment  for MRSA infections.   Ferritin     Status: Abnormal   Collection Time: 04/21/16  1:00 AM  Result Value Ref Range    Ferritin 3 (L) 11 - 307 ng/mL    Comment: Performed at [facility]  Folate     Status: None   Collection Time: 04/21/16  1:00 AM  Result Value Ref Range   Folate 11.0 >5.9 ng/mL    Comment: Performed at [facility]  Iron and TIBC     Status: Abnormal   Collection Time: 04/21/16  1:00 AM  Result Value Ref Range   Iron 13 (L) 28 - 170 ug/dL   TIBC 424 250 - 450 ug/dL   Saturation Ratios 3 (L) 10.4 - 31.8 %   UIBC 411 ug/dL    Comment: Performed at [facility]  Vitamin B12     Status: Abnormal   Collection Time: 04/21/16  1:00 AM  Result Value Ref Range   Vitamin B-12 1,003 (H) 180 - 914 pg/mL    Comment: (NOTE) This assay is not validated for testing neonatal or myeloproliferative syndrome specimens for Vitamin B12 levels. Performed at [facility]   TSH     Status: None   Collection Time: 04/21/16  8:10 AM  Result Value Ref Range   TSH 3.719 0.350 - 4.500 uIU/mL  CBC     Status: Abnormal   Collection Time: 04/21/16  8:10 AM  Result Value Ref Range   WBC 5.5 4.0 - 10.5 K/uL   RBC 3.85 (L) 3.87 - 5.11 MIL/uL   Hemoglobin 7.9 (L) 12.0 - 15.0 g/dL    Comment: REPEATED TO VERIFY DELTA CHECK NOTED POST TRANSFUSION SPECIMEN    HCT 26.5 (L) 36.0 - 46.0 %   MCV 68.8 (L) 78.0 - 100.0 fL    Comment: REPEATED TO VERIFY DELTA CHECK NOTED POST TRANSFUSION SPECIMEN    MCH 20.5 (L) 26.0 - 34.0 pg   MCHC 29.8 (L) 30.0 - 36.0 g/dL   RDW 23.5 (H) 11.5 - 15.5 %   Platelets 318 150 - 400 K/uL  Basic metabolic panel     Status: Abnormal   Collection Time: 04/21/16  8:10 AM  Result Value Ref Range   Sodium 141 135 - 145 mmol/L    Comment: REPEATED TO VERIFY   Potassium 3.8 3.5 - 5.1 mmol/L    Comment: DELTA CHECK NOTED REPEATED TO VERIFY NO VISIBLE HEMOLYSIS    Chloride 112 (H) 101 - 111 mmol/L    Comment: REPEATED TO VERIFY   CO2 25 22 - 32 mmol/L    Comment: REPEATED TO VERIFY   Glucose, Bld 92 65 - 99 mg/dL   BUN 11 6 - 20 mg/dL    Creatinine, Ser 0.54 0.44 - 1.00 mg/dL   Calcium 8.4 (L) 8.9 - 10.3 mg/dL    Comment: REPEATED TO VERIFY   GFR calc non Af Amer >60 >60 mL/min   GFR calc Af Amer >60 >60 mL/min    Comment: (NOTE) The eGFR has been calculated using the CKD EPI equation. This calculation has not been validated in all clinical situations. eGFR's persistently <60 mL/min signify possible Chronic Kidney Disease.    Anion gap 4 (L) 5 - 15    Comment: REPEATED TO VERIFY  Protime-INR     Status: None   Collection Time: 04/21/16  8:10 AM  Result Value Ref Range   Prothrombin Time 14.0 11.6 - 15.2 seconds   INR 1.06 0.00 -  1.49  APTT     Status: None   Collection Time: 04/21/16  8:10 AM  Result Value Ref Range   aPTT 29 24 - 37 seconds   Review of Systems  Constitutional: Positive for malaise/fatigue. Negative for diaphoresis.  HENT: Negative.   Eyes: Negative.   Respiratory: Positive for shortness of breath.   Cardiovascular: Negative.   Gastrointestinal: Positive for abdominal pain, blood in stool, constipation, heartburn and nausea.  Genitourinary: Negative.   Musculoskeletal: Positive for back pain and joint pain. Negative for myalgias.  Skin: Negative.   Neurological: Positive for dizziness and weakness. Negative for tingling, tremors, sensory change, speech change, focal weakness and seizures.  Endo/Heme/Allergies: Negative.    Blood pressure (!) 109/59, pulse 81, temperature 98.1 F (36.7 C), temperature source Oral, resp. rate 18, height _0  (1.499 m), weight 55.9 kg (123 lb 3.8 oz), SpO2 93 %. Physical Exam  Constitutional: She is oriented to person, place, and time. She appears well-developed and well-nourished.  HENT:  Head: Normocephalic and atraumatic.  Eyes: Conjunctivae and EOM are normal. Pupils are equal, round, and reactive to light.  Neck: Normal range of motion. Neck supple.  Cardiovascular: Normal rate and regular rhythm.   Respiratory: Effort normal and breath sounds normal.   GI: Soft. Normal appearance and bowel sounds are normal. There is tenderness in the epigastric area. There is guarding. There is no rigidity and no rebound.  Neurological: She is alert and oriented to person, place, and time.  Skin: Skin is warm and dry.  Psychiatric: She has a normal mood and affect. Her behavior is normal. Judgment and thought content normal.    Assessment/Plan: 1) Epigastric pain with GERD, melena with severe anemia in the setting of NSAID use/abuse: will proceed with an EGD tomorrow. Change Protonix to 40 mg PO QAM along with Carafate suspension TID. Patient has been advised to avoid the use of all NSAIDS/BC's, Goodies, etc.  2) Colonic polyps.  3) Hypertension-meds on hold for now.  4) Chronic anxiety and depression. 5) Chronic back pain. Christo Hain 04/21/2016, 12:53 PM

## 2016-04-21 NOTE — Progress Notes (Signed)
PROGRESS NOTE    [name]  [id number]  DOB: 30-Mar-1946  DOA: 04/20/2016 PCP: Reginia Naas, MD Outpatient Specialists:   Hospital course: [name] is a 70 y.o. woman with a history of depression, scoliosis, significant GERD, and HTN who presents to the ED after being advised of critical labs drawn in her PCP's office earlier today.  She reported for a complete physical and blood work to be done as pre-op screening before being referred to [facility] for eye surgery.  While there, she reported increased fatigue (her family says it's been present for at least one year, worse in the past 1-2 months, she will stay in bed for days at a time), dyspnea on exertion (she can get short of breath just walking across the room in her house), and dizziness.  No chest pain but she complains of epigastric pain which she associated with a history of severe GERD/acid indigestion.  She has chronic back pain related to scoliosis, and has taken Aleve for years.  She takes it as needed, just took 3 tablets yesterday (without food).  She reports "dark stools", but seems to be unsure it has been true melena.  She does not take iron or pepto bismol at baseline.  She denies hematemesis or coffee ground emesis.  No BRBPR.  Hemoglobin found to be 5 today.  Reportedly, it was 12 last year (records here jump back to 2008).  Assessment & Plan:    Symptomatic Anemia secondary to GI bleeding - History of GERD and heavy NSAID use, heme positive stools, transfusing 2 units of PRBC now but likely will need more.  Hemodynamically stable at this time.  I have tried to contact her GI Dr Collene Mares but have not been able to get thru to the answering service at the office.  May need to go with unassigned GI if I can't reach later today.  Continue IV protonix, supportive therapy, NPO.  Remain in ICU for now.   Hypertension - holding blood pressure meds for now.  Following BPs.   Chronic Depression and anxiety - Continue  home meds.    Chronic back pain secondary to scoliosis - narcotics for now.    DVT prophylaxis: SCDs Code Status: FULL Family Communication: bedside Disposition Plan: Step Down Unit.    Consultants:  Pending GI  Procedures:  Blood transfusion  Subjective: Pt seen in ICU currently receiving PRBC transfusion, tolerating it so far.  Vitals stable.  Still feeling tired.   Objective: Vitals:   04/21/16 0505 04/21/16 0600 04/21/16 0700 04/21/16 0800  BP: (!) 102/53 97/60 (!) 103/57 (!) 109/59  Pulse: 72 71 75 81  Resp: 19 15 16 18   Temp: 98.1 F (36.7 C)     TempSrc: Oral     SpO2: 96% 97% 96% 93%  Weight:      Height:        Intake/Output Summary (Last 24 hours) at 04/21/16 0813 Last data filed at 04/21/16 0505  Gross per 24 hour  Intake           923.33 ml  Output             1400 ml  Net          -476.67 ml   Filed Weights   04/20/16 2011 04/21/16 0000  Weight: 54.1 kg (119 lb 3.2 oz) 55.9 kg (123 lb 3.8 oz)    Exam:  Eyes: PERRL, lids and conjunctivae normal ENMT: Mucous membranes are moist. Posterior pharynx clear  of any exudate or lesions. Normal dentition.  Neck: normal appearance, supple Respiratory: Bibasilar crackles.  No wheezing.  Normal respiratory effort. No accessory muscle use.  Cardiovascular: Mildly tachycardic but regular.  No murmurs / rubs / gallops. No extremity edema. 2+ pedal pulses. GI: abdomen is soft and compressible.  No distention.  No tenderness.  No guarding.  No masses palpated.  Bowel sounds are present. Musculoskeletal:  No joint deformity in upper and lower extremities. Marked scoliosis of thoracic spine noted.  Good ROM, no contractures. Normal muscle tone.  Skin: no rashes, warm and dry, PALE Neurologic: No focal deficits. Psychiatric: Normal judgment and insight. Alert and oriented x 3. Normal mood.   Data Reviewed: Basic Metabolic Panel:  Recent Labs Lab 04/20/16 2115  NA 137  K 2.7*  CL 103  CO2 27  GLUCOSE 112*    BUN 18  CREATININE 0.77  CALCIUM 9.0   Liver Function Tests:  Recent Labs Lab 04/20/16 2115  AST 20  ALT 16  ALKPHOS 75  BILITOT 0.6  PROT 6.5  ALBUMIN 4.0   No results for input(s): LIPASE, AMYLASE in the last 168 hours. No results for input(s): AMMONIA in the last 168 hours. CBC:  Recent Labs Lab 04/20/16 2115  WBC 8.0  NEUTROABS 6.0  HGB 5.1*  HCT 19.5*  MCV 63.1*  PLT 401*   Cardiac Enzymes: No results for input(s): CKTOTAL, CKMB, CKMBINDEX, TROPONINI in the last 168 hours. BNP (last 3 results) No results for input(s): PROBNP in the last 8760 hours. CBG: No results for input(s): GLUCAP in the last 168 hours.  Recent Results (from the past 240 hour(s))  MRSA PCR Screening     Status: None   Collection Time: 04/20/16 11:34 PM  Result Value Ref Range Status   MRSA by PCR NEGATIVE NEGATIVE Final    Comment:        The GeneXpert MRSA Assay (FDA approved for NASAL specimens only), is one component of a comprehensive MRSA colonization surveillance program. It is not intended to diagnose MRSA infection nor to guide or monitor treatment for MRSA infections.      Studies: No results found.   Scheduled Meds: . DULoxetine  60 mg Oral BID  . pantoprazole  40 mg Intravenous Q12H  . QUEtiapine  25 mg Oral QHS  . sodium chloride flush  3 mL Intravenous Q12H   Continuous Infusions:   Principal Problem:   Symptomatic anemia Active Problems:   NSAID long-term use   Scoliosis   Depression   HTN (hypertension)   GERD (gastroesophageal reflux disease)   Occult GI bleeding   Time spent:    Irwin Brakeman, MD, FAAFP [facility] Pager [phone]  If 7PM-7AM, please contact night-coverage [email or web address] Password [facility] 04/21/2016, 8:13 AM    LOS: 0 days

## 2016-04-21 NOTE — Progress Notes (Signed)
Initial Nutrition Assessment  DOCUMENTATION CODES:   Not applicable  INTERVENTION:  - Continue Boost Breeze TID, each supplement provides 250 kcal and 9 grams of protein - Diet advancement as medically feasible.  - Will order Boost Plus with diet advancement. - Continue to encourage PO intakes of meals and supplements. - RD will continue to monitor for needs.  NUTRITION DIAGNOSIS:   Inadequate protein intake related to other (see comment) (current diet order ) as evidenced by other (see comment) (CLD does not meet estimated protein needs.).  GOAL:   Patient will meet greater than or equal to 90% of their needs  MONITOR:   PO intake, Supplement acceptance, Diet advancement, Weight trends, Labs, I & O's  REASON FOR ASSESSMENT:   Malnutrition Screening Tool  ASSESSMENT:   70 y.o. woman with a history of depression, scoliosis, significant GERD, and HTN who presents to the ED after being advised of critical labs drawn in her PCP's office earlier today.  She reported for a complete physical and blood work to be done as pre-op screening before being referred to [facility] for eye surgery.  While there, she reported increased fatigue (her family says it's been present for at least one year, worse in the past 1-2 months, she will stay in bed for days at a time), dyspnea on exertion (she can get short of breath just walking across the room in her house), and dizziness.  No chest pain but she complains of epigastric pain which she associated with a history of severe GERD/acid indigestion.  She has chronic back pain related to scoliosis, and has taken Aleve for years.  She takes it as needed, just took 3 tablets yesterday (without food).  She reports "dark stools", but seems to be unsure it has been true melena.  She does not take iron or pepto bismol at baseline.  She denies hematemesis or coffee ground emesis.  No BRBPR.  Hemoglobin found to be 5 today.  Pt seen for MST. BMI indicates normal  weight/borderline over weight. Pt currently on CLD with Boost Breeze ordered TID; no intakes documented since admission. Pt reports 100% completion of breakfast which consisted of chicken broth, juice, and jello. She denies abdominal pain or nausea with intake of this meal and that she ordered these same items for lunch today.   Pt states that she was provided with a Boost Plus earlier today but declined as she felt it would give her diarrhea. Pt reports she drinks Boost at least once/day at home but that she was feeling a bit nauseated when Boost was offered and that when she feels this way this supplement gives her diarrhea.   Cousin was at bedside at time of RD visit; she helped RD explain Boost Breeze to pt and cousin provided encouragement for pt to try this supplement while on CLD. Pt seems to value input and opinion of cousin and seems receptive to trying [employer].   Pt states that at home she does not cook and that a meal might consist of a peanut butter and jelly sandwich. Unable to obtain further information at this time as pt's daughter called and pt wished to speak with her and have RD visit another time; will continue discussion at follow-up. Note associated with MST score states that pt reported she had lost a couple of pounds in the past week with decreased appetite d/t depression.  Physical assessment shows no fat wasting and no edema to upper body (lower body unable to be  assessed at this visit). Mild muscle wasting around clavicle area. Unable to obtain information about weight trends PTA; will do so at follow-up.  Medications reviewed; 2 g IV Mg sulfate x1 dose yesterday, PRN Zofran, 40 mg Protonix/day, 10 mEq IV KCl x2 runs yesterday, 40 mEq oral KCl x2 doses yesterday.  Labs reviewed; Cl: 112 mmoL/L, Ca: 8.4 mg/dL.   Diet Order:  Diet clear liquid Room service appropriate? Yes; Fluid consistency: Thin  Skin:  Reviewed, no issues  Last BM:  7/24  Height:   Ht Readings  from Last 1 Encounters:  04/21/16 4\' 11"  (1.499 m)    Weight:   Wt Readings from Last 1 Encounters:  04/21/16 123 lb 3.8 oz (55.9 kg)    Ideal Body Weight:  42.91 kg (kg)  BMI:  Body mass index is 24.89 kg/m.  Estimated Nutritional Needs:   Kcal:  1300-1500  Protein:  50-60 grams  Fluid:  >/= 1.5 L/day  EDUCATION NEEDS:   No education needs identified at this time    Jarome Matin, MS, RD, LDN Inpatient Clinical Dietitian Pager # [phone] After hours/weekend pager # [phone]

## 2016-04-21 NOTE — Progress Notes (Signed)
04/21/2016 8:40 AM  Progress Note  I contacted Dr. Collene Mares and she will be coming to consult on patient today.    Murvin Natal, MD

## 2016-04-22 ENCOUNTER — Encounter (HOSPITAL_COMMUNITY): Payer: Self-pay | Admitting: *Deleted

## 2016-04-22 ENCOUNTER — Encounter (HOSPITAL_COMMUNITY)
Admission: EM | Disposition: A | Discharge status: Home / Self Care | Payer: Self-pay | Source: Home / Self Care | Attending: Emergency Medicine

## 2016-04-22 DIAGNOSIS — M419 Scoliosis, unspecified: Secondary | ICD-10-CM

## 2016-04-22 DIAGNOSIS — Z791 Long term (current) use of non-steroidal anti-inflammatories (NSAID): Secondary | ICD-10-CM | POA: Diagnosis not present

## 2016-04-22 DIAGNOSIS — K219 Gastro-esophageal reflux disease without esophagitis: Secondary | ICD-10-CM | POA: Diagnosis not present

## 2016-04-22 DIAGNOSIS — D649 Anemia, unspecified: Secondary | ICD-10-CM

## 2016-04-22 DIAGNOSIS — F329 Major depressive disorder, single episode, unspecified: Secondary | ICD-10-CM | POA: Diagnosis not present

## 2016-04-22 DIAGNOSIS — I10 Essential (primary) hypertension: Secondary | ICD-10-CM | POA: Diagnosis not present

## 2016-04-22 HISTORY — PX: ESOPHAGOGASTRODUODENOSCOPY: SHX5428

## 2016-04-22 LAB — TYPE AND SCREEN
ABO/RH(D): O POS
ANTIBODY SCREEN: NEGATIVE
UNIT DIVISION: 0
Unit division: 0

## 2016-04-22 LAB — CBC
HCT: 31.1 % — ABNORMAL LOW (ref 36.0–46.0)
Hemoglobin: 9.3 g/dL — ABNORMAL LOW (ref 12.0–15.0)
MCH: 20.7 pg — AB (ref 26.0–34.0)
MCHC: 29.9 g/dL — ABNORMAL LOW (ref 30.0–36.0)
MCV: 69.3 fL — ABNORMAL LOW (ref 78.0–100.0)
PLATELETS: 381 10*3/uL (ref 150–400)
RBC: 4.49 MIL/uL (ref 3.87–5.11)
RDW: 23.7 % — AB (ref 11.5–15.5)
WBC: 5.8 10*3/uL (ref 4.0–10.5)

## 2016-04-22 SURGERY — EGD (ESOPHAGOGASTRODUODENOSCOPY)
Anesthesia: Moderate Sedation

## 2016-04-22 MED ORDER — MIDAZOLAM HCL 5 MG/ML IJ SOLN
INTRAMUSCULAR | Status: AC
  Filled 2016-04-22: qty 2

## 2016-04-22 MED ORDER — SODIUM CHLORIDE 0.9 % IV SOLN
510.0000 mg | Freq: Once | INTRAVENOUS | Status: AC
  Administered 2016-04-22: 510 mg via INTRAVENOUS
  Filled 2016-04-22: qty 17

## 2016-04-22 MED ORDER — DIPHENHYDRAMINE HCL 50 MG/ML IJ SOLN
INTRAMUSCULAR | Status: AC
  Filled 2016-04-22: qty 1

## 2016-04-22 MED ORDER — FENTANYL CITRATE (PF) 100 MCG/2ML IJ SOLN
INTRAMUSCULAR | Status: AC
  Filled 2016-04-22: qty 2

## 2016-04-22 MED ORDER — FENTANYL CITRATE (PF) 100 MCG/2ML IJ SOLN
INTRAMUSCULAR | Status: DC | PRN
  Administered 2016-04-22 (×2): 25 ug via INTRAVENOUS

## 2016-04-22 MED ORDER — MIDAZOLAM HCL 10 MG/2ML IJ SOLN
INTRAMUSCULAR | Status: DC | PRN
  Administered 2016-04-22: 2 mg via INTRAVENOUS
  Administered 2016-04-22 (×2): 2.5 mg via INTRAVENOUS

## 2016-04-22 MED ORDER — LIDOCAINE VISCOUS 2 % MT SOLN
OROMUCOSAL | Status: AC
  Filled 2016-04-22: qty 15

## 2016-04-22 MED ORDER — SODIUM CHLORIDE 0.9 % IV SOLN
INTRAVENOUS | Status: DC
  Administered 2016-04-22: 500 mL via INTRAVENOUS

## 2016-04-22 NOTE — Progress Notes (Signed)
PROGRESS NOTE    Elaine Rivera  [id number]  DOB: 1945/12/25  DOA: 04/20/2016 PCP: Reginia Naas, MD Outpatient Specialists:   Hospital course: Elaine Rivera is a 70 y.o. woman with a history of depression, scoliosis, significant GERD, and HTN who presents to the ED after being advised of critical labs drawn in her PCP's office earlier today.  She reported for a complete physical and blood work to be done as pre-op screening before being referred to [facility] for eye surgery.  While there, she reported increased fatigue (her family says it's been present for at least one year, worse in the past 1-2 months, she will stay in bed for days at a time), dyspnea on exertion (she can get short of breath just walking across the room in her house), and dizziness.  No chest pain but she complains of epigastric pain which she associated with a history of severe GERD/acid indigestion.  She has chronic back pain related to scoliosis, and has taken Aleve for years.  She takes it as needed, just took 3 tablets yesterday (without food).  She reports "dark stools", but seems to be unsure it has been true melena.  She does not take iron or pepto bismol at baseline.  She denies hematemesis or coffee ground emesis.  No BRBPR.  Hemoglobin found to be 5 today.  Reportedly, it was 12 last year (records here jump back to 2008).  Assessment & Plan:    Symptomatic Anemia secondary to GI bleeding - History of GERD and heavy NSAID use, heme positive stools, transfusing 2 units of PRBC.  Hemodynamically stable at this time.  For EGD today.  Will give IV fe today  Hypertension - holding blood pressure meds for now.  Following BPs.   Chronic Depression and anxiety - Continue home meds.    Chronic back pain secondary to scoliosis - narcotics for now- plans to have surgery soon  DVT prophylaxis: SCDs Code Status: FULL Family Communication: bedside Disposition Plan: tx to medsurge-- home in  AM?   Consultants:  PGI  Procedures:  Blood transfusion  Subjective: No current complaints  Objective: Vitals:   04/22/16 0500 04/22/16 0600 04/22/16 0700 04/22/16 0800  BP:    111/66  Pulse: 74 72 70 77  Resp: 14 16 18 17   Temp:    98.1 F (36.7 C)  TempSrc:    Oral  SpO2: 94% 95% 95% 94%  Weight:      Height:        Intake/Output Summary (Last 24 hours) at 04/22/16 1043 Last data filed at 04/22/16 0700  Gross per 24 hour  Intake                0 ml  Output             1150 ml  Net            -1150 ml   Filed Weights   04/20/16 2011 04/21/16 0000  Weight: 54.1 kg (119 lb 3.2 oz) 55.9 kg (123 lb 3.8 oz)    Exam:  A+Ox3, NAD Respiratory:   No wheezing.  Normal respiratory effort. No accessory muscle use.  Cardiovascular: Mildly tachycardic but regular.  No murmurs / rubs / gallops. No extremity edema. 2+ pedal pulses. GI: abdomen is soft and compressible.  No distention.  No tenderness.  No guarding.  No masses palpated.  Bowel sounds are present..  Skin: no rashes, warm and dry, PALE   Data Reviewed: Basic Metabolic  Panel:  Recent Labs Lab 04/20/16 2115 04/21/16 0810  NA 137 141  K 2.7* 3.8  CL 103 112*  CO2 27 25  GLUCOSE 112* 92  BUN 18 11  CREATININE 0.77 0.54  CALCIUM 9.0 8.4*   Liver Function Tests:  Recent Labs Lab 04/20/16 2115  AST 20  ALT 16  ALKPHOS 75  BILITOT 0.6  PROT 6.5  ALBUMIN 4.0   No results for input(s): LIPASE, AMYLASE in the last 168 hours. No results for input(s): AMMONIA in the last 168 hours. CBC:  Recent Labs Lab 04/20/16 2115 04/21/16 0810  WBC 8.0 5.5  NEUTROABS 6.0  --   HGB 5.1* 7.9*  HCT 19.5* 26.5*  MCV 63.1* 68.8*  PLT 401* 318   Cardiac Enzymes: No results for input(s): CKTOTAL, CKMB, CKMBINDEX, TROPONINI in the last 168 hours. BNP (last 3 results) No results for input(s): PROBNP in the last 8760 hours. CBG: No results for input(s): GLUCAP in the last 168 hours.  Recent Results (from  the past 240 hour(s))  MRSA PCR Screening     Status: None   Collection Time: 04/20/16 11:34 PM  Result Value Ref Range Status   MRSA by PCR NEGATIVE NEGATIVE Final    Comment:        The GeneXpert MRSA Assay (FDA approved for NASAL specimens only), is one component of a comprehensive MRSA colonization surveillance program. It is not intended to diagnose MRSA infection nor to guide or monitor treatment for MRSA infections.      Studies: No results found.   Scheduled Meds: . DULoxetine  120 mg Oral QHS  . feeding supplement  1 Container Oral TID BM  . pantoprazole  40 mg Oral Daily  . QUEtiapine  25 mg Oral QHS  . sodium chloride flush  3 mL Intravenous Q12H  . sucralfate  1 g Oral TID WC & HS   Continuous Infusions:   Principal Problem:   Symptomatic anemia Active Problems:   NSAID long-term use   Scoliosis   Depression   HTN (hypertension)   GERD (gastroesophageal reflux disease)   Occult GI bleeding   Time spent: 25 min   [place], DO [facility] Pager [phone] If 7PM-7AM, please contact night-coverage [email or web address] Password [email or web address] 04/22/2016, 10:43 AM    LOS: 0 days

## 2016-04-22 NOTE — Op Note (Signed)
[facility] Patient Name: Elaine Rivera Procedure Date: 04/22/2016 MRN: [id number] Attending MD: Juanita Craver , MD Date of Birth: 1945-12-10 CSN: [id number] Age: 70 Admit Type: Inpatient Procedure:                Diagnostic EGD. Indications:              Epigastric pain, Iron deficiency anemia,                            Gastro-esophageal reflux disease, Heme positive                            stool. Providers:                Juanita Craver, MD, Cleda Daub, RN, Ralene Bathe,                            Technician. Referring MD:             Judithann Sauger MD, MD Medicines:                Fentanyl 50 micrograms & Midazolam 7 mg IV. Complications:            No immediate complications. Estimated Blood Loss:     Estimated blood loss: none. Procedure:                Pre-anesthesia assessment: - Prior to the                            procedure, a history and physical was performed,                            and patient medications and allergies were                            reviewed. The patient's tolerance of previous                            anesthesia was also reviewed. The risks and                            benefits of the procedure and the sedation options                            and risks were discussed with the patient. All                            questions were answered, and informed consent was                            obtained. Prior anticoagulants: The patient has                            taken previous NSAID medication, last dose was 3  days prior to procedure. ASA Grade assessment: III                            - A patient with severe systemic disease. After                            reviewing the risks and benefits, the patient was                            deemed in satisfactory condition to undergo the                            procedure. After obtaining informed consent, the   endoscope was passed under direct vision.                            Throughout the procedure, the patient's blood                            pressure, pulse, and oxygen saturations were                            monitored continuously. The [id number]                            scope was introduced through the mouth, and                            advanced to the second part of duodenum. The EGD                            was accomplished without difficulty. The patient                            tolerated the procedure well. Scope In: Scope Out: Findings:      LA Grade B (one or more mucosal breaks greater than 5 mm, not extending       between the tops of two mucosal folds) esophagitis with bleeding was       found from 35-40 cm .      A large hiatal hernia was noted on retroflexion; the gastric mucosa       appeared normal otherwise.      The examined duodenum was normal. Impression:               - LA Grade B reflux esophagitis.                           - Large hiatal hernia; otherwise normal appaering                            stomach.                           - Normal examined duodenum.                           -  No specimens collected. Moderate Sedation:      Moderate (conscious) sedation was administered by the endoscopy nurse       and supervised by the endoscopist. The following parameters were       monitored: oxygen saturation, heart rate, blood pressure and respiratory       rate. Total physician intraservice time was 4 minutes. Recommendation:           - Full liquid diet today-advance as tolerated.                           - Continue present medications.                           - Return to my office in 2 weeks. Procedure Code(s):        --- Professional ---                           [phone], Esophagogastroduodenoscopy, flexible,                            transoral; diagnostic, including collection of                            specimen(s) by brushing or  washing, when performed                            (separate procedure) Diagnosis Code(s):        --- Professional ---                           D50.9, Iron deficiency anemia, unspecified                           K21.0, Gastro-esophageal reflux disease with                            esophagitis                           R10.13, Epigastric pain                           R19.5, Other fecal abnormalities                           K44.9, Diaphragmatic hernia without obstruction or                            gangrene CPT copyright 2016 American Medical Association. All rights reserved. The codes documented in this report are preliminary and upon coder review may  be revised to meet current compliance requirements. Juanita Craver, MD Juanita Craver, MD 04/22/2016 1:19:38 PM This report has been signed electronically. Number of Addenda: 0

## 2016-04-23 ENCOUNTER — Encounter (HOSPITAL_COMMUNITY): Payer: Self-pay | Admitting: Gastroenterology

## 2016-04-23 DIAGNOSIS — I10 Essential (primary) hypertension: Secondary | ICD-10-CM

## 2016-04-23 DIAGNOSIS — D649 Anemia, unspecified: Secondary | ICD-10-CM | POA: Diagnosis not present

## 2016-04-23 DIAGNOSIS — M419 Scoliosis, unspecified: Secondary | ICD-10-CM | POA: Diagnosis not present

## 2016-04-23 DIAGNOSIS — F329 Major depressive disorder, single episode, unspecified: Secondary | ICD-10-CM | POA: Diagnosis not present

## 2016-04-23 LAB — CBC
HEMATOCRIT: 28.7 % — AB (ref 36.0–46.0)
Hemoglobin: 8.5 g/dL — ABNORMAL LOW (ref 12.0–15.0)
MCH: 20.6 pg — ABNORMAL LOW (ref 26.0–34.0)
MCHC: 29.6 g/dL — AB (ref 30.0–36.0)
MCV: 69.7 fL — ABNORMAL LOW (ref 78.0–100.0)
PLATELETS: 353 10*3/uL (ref 150–400)
RBC: 4.12 MIL/uL (ref 3.87–5.11)
RDW: 23.9 % — AB (ref 11.5–15.5)
WBC: 4.8 10*3/uL (ref 4.0–10.5)

## 2016-04-23 LAB — BASIC METABOLIC PANEL
Anion gap: 5 (ref 5–15)
BUN: 6 mg/dL (ref 6–20)
CALCIUM: 9.5 mg/dL (ref 8.9–10.3)
CO2: 28 mmol/L (ref 22–32)
CREATININE: 0.74 mg/dL (ref 0.44–1.00)
Chloride: 110 mmol/L (ref 101–111)
Glucose, Bld: 94 mg/dL (ref 65–99)
Potassium: 3.7 mmol/L (ref 3.5–5.1)
SODIUM: 143 mmol/L (ref 135–145)

## 2016-04-23 MED ORDER — PANTOPRAZOLE SODIUM 40 MG PO TBEC: 40.0000 mg | DELAYED_RELEASE_TABLET | Freq: Every day | ORAL | 0 refills | Status: DC

## 2016-04-23 MED ORDER — ACETAMINOPHEN 325 MG PO TABS: 650.0000 mg | ORAL_TABLET | Freq: Four times a day (QID) | ORAL | Status: DC | PRN

## 2016-04-23 NOTE — Clinical Social Work Note (Signed)
Clinical Social Work Assessment  Patient Details  Name: Elaine Rivera MRN: [id number] Date of Birth: 1945-12-27  Date of referral:  04/23/16               Reason for consult:  Mental Health Concerns                Permission sought to share information with:    Permission granted to share information::     Name::        Agency::     Relationship::     Contact Information:     Housing/Transportation Living arrangements for the past 2 months:  Single Family Home Source of Information:  Patient Patient Interpreter Needed:  None Criminal Activity/Legal Involvement Pertinent to Current Situation/Hospitalization:    Significant Relationships:  Adult Children Lives with:  Self Do you feel safe going back to the place where you live?  Yes Need for family participation in patient care:  Yes (Comment)  Care giving concerns:  CSW received a consult to speak with patient regarding her mental health and possible depression.    Social Worker assessment / plan:  CSW spoke with patient at bedside to discuss the possibility of depression. Patient's family was in the room and CSW asked if they could step-out. Patient mentioned that she has been feeling "down" for the past few weeks and has not wanted to get out of bed. Patient also mentions that she has recently been sick which has made her want to stay in bed more.  CSW asked if patient has ever received mental health counseling/ treatment, patient has been is no longer going to appointments. Patient stated her "medicine is making her more tired". Patient stated that she has thought of not wanting to live anymore but has never thought of hurting herself.   Employment status:  Retired Nurse, adult PT Recommendations:  Not assessed at this time Information / Referral to community resources:     Patient/Family's Response to care:  Patient was appreciative of CSW.  Patient/Family's Understanding of and Emotional  Response to Diagnosis, Current Treatment, and Prognosis: Patient understood her current prognosis and treatment. CSW will contact attending to discuss patients concerns.    Emotional Assessment Appearance:  Appears stated age Attitude/Demeanor/Rapport:    Affect (typically observed):  Quiet, Calm, Sad Orientation:  Oriented to Self, Oriented to Place, Oriented to  Time, Oriented to Situation Alcohol / Substance use:    Psych involvement (Current and /or in the community):  No (Comment)  Discharge Needs  Concerns to be addressed:  Medication Concerns Readmission within the last 30 days:  No Current discharge risk:  None Barriers to Discharge:  No Barriers Identified   Weston Anna, LCSW 04/23/2016, 10:27 AM

## 2016-04-23 NOTE — Progress Notes (Signed)
Subjective: Since I last evaluated the patient, she seems to be doing well. She has not had a BM in 2 days. She denies having any abdominal pain, nausea or vomiting. She has been on full liquid diet and will have a regular meal prior to discharge today as discussed with Dr. Eulogio Bear. Objective: Vital signs in last 24 hours: Temp:  [97.4 F (36.3 C)-99 F (37.2 C)] 97.4 F (36.3 C) (07/27 0550) Pulse Rate:  [77-95] 90 (07/27 0550) Resp:  [12-23] 16 (07/27 0550) BP: (96-130)/(46-72) 111/68 (07/27 0550) SpO2:  [93 %-100 %] 94 % (07/27 0550) Last BM Date: 04/22/16  Intake/Output from previous day: 07/26 0701 - 07/27 0700 In: 440 [P.O.:240; I.V.:200] Out: 400 [Urine:400] Intake/Output this shift: No intake/output data recorded.  General appearance: alert, cooperative, appears stated age, no distress and pale Resp: clear to auscultation bilaterally Cardio: regular rate and rhythm, S1, S2 normal, no murmur, click, rub or gallop GI: soft, non-tender; bowel sounds normal; no masses,  no organomegaly Extremities: extremities normal, atraumatic, no cyanosis or edema  Lab Results:  Recent Labs  04/21/16 0810 04/22/16 1941 04/23/16 0352  WBC 5.5 5.8 4.8  HGB 7.9* 9.3* 8.5*  HCT 26.5* 31.1* 28.7*  PLT 318 381 353   BMET  Recent Labs  04/20/16 2115 04/21/16 0810 04/23/16 0352  NA 137 141 143  K 2.7* 3.8 3.7  CL 103 112* 110  CO2 27 25 28   GLUCOSE 112* 92 94  BUN 18 11 6   CREATININE 0.77 0.54 0.74  CALCIUM 9.0 8.4* 9.5   LFT  Recent Labs  04/20/16 2115  PROT 6.5  ALBUMIN 4.0  AST 20  ALT 16  ALKPHOS 75  BILITOT 0.6   PT/INR  Recent Labs  04/21/16 0810  LABPROT 14.0  INR 1.06   No results found.  Medications: I have reviewed the patient's current medications.  Assessment/Plan: 1) Severe iron deficiency anemia, GERD with esophagitis and a large hiatal hernia: will need to be discharged on PPI's and Carafate suspension.  2) Constipation: Colace 100 mg  2 PO QHS has been advised. 3) Severe back pain/arthritis: see needs to off of all NSAIDS.   LOS: 0 days   Kiing Deakin 04/23/2016, 7:11 AM

## 2016-04-23 NOTE — Progress Notes (Signed)
Date: April 23 2016 No needs present at time of discharge. Velva Harman, RN, BSN, [place]   [phone]

## 2016-04-23 NOTE — Discharge Summary (Signed)
Physician Discharge Summary  Elaine Rivera [id number] DOB: 1946-07-21 DOA: 04/20/2016  PCP: Reginia Naas, MD  Admit date: 04/20/2016 Discharge date: 04/23/2016   Recommendations for Outpatient Follow-Up:   1. Outpatient psych/counselling referral for depression 2. Outpatient colonoscopy 3. Cbc 1-2 weeks   Discharge Diagnosis:   Principal Problem:   Symptomatic anemia Active Problems:   NSAID long-term use   Scoliosis   Depression   HTN (hypertension)   GERD (gastroesophageal reflux disease)   Occult GI bleeding   Discharge disposition:  Home.   Discharge Condition: Improved.  Diet recommendation: Low sodium, heart healthy.  Wound care: None.   History of Present Illness:   Elaine Rivera is a 70 y.o. woman with a history of depression, scoliosis, significant GERD, and HTN who presents to the ED after being advised of critical labs drawn in her PCP's office earlier today.  She reported for a complete physical and blood work to be done as pre-op screening before being referred to [facility] for eye surgery.  While there, she reported increased fatigue (her family says it's been present for at least one year, worse in the past 1-2 months, she will stay in bed for days at a time), dyspnea on exertion (she can get short of breath just walking across the room in her house), and dizziness.  No chest pain but she complains of epigastric pain which she associated with a history of severe GERD/acid indigestion.  She has chronic back pain related to scoliosis, and has taken Aleve for years.  She takes it as needed, just took 3 tablets yesterday (without food).  She reports "dark stools", but seems to be unsure it has been true melena.  She does not take iron or pepto bismol at baseline.  She denies hematemesis or coffee ground emesis.  No BRBPR.  Hemoglobin found to be 5 today.  Reportedly, it was 12 last year (records here jump back to 2008).   Hospital Course by Problem:    Symptomatic Anemia secondary to GI bleeding - History of GERD and heavy NSAID use, heme positive stools, s/p 2 units of PRBC.  Dose of IV Fe given as well, CBC 2 weeks as outpatient, needs colonoscoopy,  EGD showed:  LA Grade B reflux esophagitis.                           - Large hiatal hernia; otherwise normal appaering                            stomach.                           - Normal examined duodenum.                   - No specimens collected.  Hypertension - stable  Chronic Depression and anxiety - Continue home meds, needs outpatient referral to psych/counselling  Chronic back pain secondary to scoliosis - patient will use tylenol and avoid NSAIDS    Medical Consultants:    GI   Discharge Exam:   Vitals:   04/22/16 2233 04/23/16 0550  BP: (!) 130/54 111/68  Pulse: 92 90  Resp: 16 16  Temp: 98.3 F (36.8 C) 97.4 F (36.3 C)   Vitals:   04/22/16 1340 04/22/16 1450 04/22/16 2233 04/23/16 0550  BP: (!) 102/50 114/67 (!) 130/54  111/68  Pulse: 91 84 92 90  Resp: 15  16 16   Temp:  98.3 F (36.8 C) 98.3 F (36.8 C) 97.4 F (36.3 C)  TempSrc:  Oral Oral Oral  SpO2: 93% 96% 98% 94%  Weight:      Height:        Gen:  NAD    The results of significant diagnostics from this hospitalization (including imaging, microbiology, ancillary and laboratory) are listed below for reference.     Procedures and Diagnostic Studies:   No results found.   Labs:   Basic Metabolic Panel:  Recent Labs Lab 04/20/16 2115 04/21/16 0810 04/23/16 0352  NA 137 141 143  K 2.7* 3.8 3.7  CL 103 112* 110  CO2 27 25 28   GLUCOSE 112* 92 94  BUN 18 11 6   CREATININE 0.77 0.54 0.74  CALCIUM 9.0 8.4* 9.5   GFR Estimated Creatinine Clearance: 49.9 mL/min (by C-G formula based on SCr of 0.8 mg/dL). Liver Function Tests:  Recent Labs Lab 04/20/16 2115  AST 20  ALT 16  ALKPHOS 75  BILITOT 0.6  PROT 6.5  ALBUMIN 4.0   No results for input(s): LIPASE, AMYLASE in  the last 168 hours. No results for input(s): AMMONIA in the last 168 hours. Coagulation profile  Recent Labs Lab 04/21/16 0810  INR 1.06    CBC:  Recent Labs Lab 04/20/16 2115 04/21/16 0810 04/22/16 1941 04/23/16 0352  WBC 8.0 5.5 5.8 4.8  NEUTROABS 6.0  --   --   --   HGB 5.1* 7.9* 9.3* 8.5*  HCT 19.5* 26.5* 31.1* 28.7*  MCV 63.1* 68.8* 69.3* 69.7*  PLT 401* 318 381 353   Cardiac Enzymes: No results for input(s): CKTOTAL, CKMB, CKMBINDEX, TROPONINI in the last 168 hours. BNP: Invalid input(s): POCBNP CBG: No results for input(s): GLUCAP in the last 168 hours. D-Dimer No results for input(s): DDIMER in the last 72 hours. Hgb A1c No results for input(s): HGBA1C in the last 72 hours. Lipid Profile No results for input(s): CHOL, HDL, LDLCALC, TRIG, CHOLHDL, LDLDIRECT in the last 72 hours. Thyroid function studies  Recent Labs  04/21/16 0810  TSH 3.719   Anemia work up  Recent Labs  04/20/16 2115 04/21/16 0100  VITAMINB12  --  1,003*  FOLATE  --  11.0  FERRITIN  --  3*  TIBC  --  424  IRON  --  13*  RETICCTPCT 1.5  --    Microbiology Recent Results (from the past 240 hour(s))  MRSA PCR Screening     Status: None   Collection Time: 04/20/16 11:34 PM  Result Value Ref Range Status   MRSA by PCR NEGATIVE NEGATIVE Final    Comment:        The GeneXpert MRSA Assay (FDA approved for NASAL specimens only), is one component of a comprehensive MRSA colonization surveillance program. It is not intended to diagnose MRSA infection nor to guide or monitor treatment for MRSA infections.      Discharge Instructions:   Discharge Instructions    Diet - low sodium heart healthy    Complete by:  As directed   Increase activity slowly    Complete by:  As directed       Medication List    STOP taking these medications   amLODipine 10 MG tablet Commonly known as:  NORVASC   naproxen sodium 220 MG tablet Commonly known as:  ANAPROX     TAKE these  medications   acetaminophen  325 MG tablet Commonly known as:  TYLENOL Take 2 tablets (650 mg total) by mouth every 6 (six) hours as needed for mild pain (or Fever >/= 101).   acyclovir 400 MG tablet Commonly known as:  ZOVIRAX Take 400 mg by mouth 3 (three) times daily as needed (for cold sores).   ALPRAZolam 0.5 MG tablet Commonly known as:  XANAX Take 0.5 mg by mouth daily as needed for anxiety.   DULoxetine 60 MG capsule Commonly known as:  CYMBALTA Take 120 mg by mouth at bedtime.   pantoprazole 40 MG tablet Commonly known as:  PROTONIX Take 1 tablet (40 mg total) by mouth daily.   QUEtiapine 25 MG tablet Commonly known as:  SEROQUEL Take 25 mg by mouth at bedtime.      Follow-up Information    Reginia Naas, MD Follow up in 1 week(s).   Specialty:  Family Medicine Why:  cbc Contact information: [place] [address] [phone]        Juanita Craver, MD. Schedule an appointment as soon as possible for a visit today.   Specialty:  Gastroenterology Why:  for colonoscopy Contact information: [address] [place] [address] [phone]            Time coordinating discharge: 35 min  Signed:  Shonta Bourque U Lewie Deman   [facility] 04/23/2016, 12:40 PM

## 2016-07-30 ENCOUNTER — Telehealth: Payer: Self-pay

## 2016-07-30 ENCOUNTER — Ambulatory Visit: Payer: Self-pay | Admitting: Neurology

## 2016-07-30 NOTE — Telephone Encounter (Signed)
Said that she would call back to reschedule.

## 2016-08-04 ENCOUNTER — Encounter: Payer: Self-pay | Admitting: Neurology

## 2016-08-26 ENCOUNTER — Ambulatory Visit: Payer: Self-pay | Admitting: Neurology

## 2017-04-02 ENCOUNTER — Ambulatory Visit: Payer: Medicare Other | Attending: Family Medicine | Admitting: Physical Therapy

## 2017-05-18 ENCOUNTER — Telehealth: Payer: Self-pay | Admitting: Neurology

## 2017-05-18 ENCOUNTER — Ambulatory Visit: Payer: Medicare Other | Admitting: Neurology

## 2017-05-18 NOTE — Telephone Encounter (Signed)
This patient did not show for appointment, canceled same day of the appointment, this is the second occurrence within the last 12 months. This is a new patient appointment.

## 2017-05-19 ENCOUNTER — Ambulatory Visit: Payer: Medicare Other | Admitting: Neurology

## 2017-05-19 ENCOUNTER — Encounter: Payer: Self-pay | Admitting: Neurology

## 2017-05-19 NOTE — Telephone Encounter (Signed)
Pt just called to cancel appointment for today due to an upset stomach, I informed her of the NoShow policy.  Pt states she will call back to reschedule.  She has not asked for a call back

## 2017-05-19 NOTE — Telephone Encounter (Signed)
Angie notified of patient's second cancellation same day. She is going to be dismissed from [facility] per CW,MD request

## 2017-05-25 ENCOUNTER — Encounter: Payer: Self-pay | Admitting: Neurology

## 2017-07-14 ENCOUNTER — Ambulatory Visit: Payer: Medicare Other | Admitting: Physical Therapy

## 2017-07-19 ENCOUNTER — Ambulatory Visit: Payer: Medicare Other | Attending: Family Medicine | Admitting: Physical Therapy

## 2017-07-19 ENCOUNTER — Ambulatory Visit: Payer: Medicare Other | Admitting: Physical Therapy

## 2017-07-19 ENCOUNTER — Encounter: Payer: Self-pay | Admitting: Physical Therapy

## 2017-07-19 DIAGNOSIS — R42 Dizziness and giddiness: Secondary | ICD-10-CM | POA: Diagnosis present

## 2017-07-19 DIAGNOSIS — M6281 Muscle weakness (generalized): Secondary | ICD-10-CM

## 2017-07-19 DIAGNOSIS — R293 Abnormal posture: Secondary | ICD-10-CM | POA: Diagnosis present

## 2017-07-19 DIAGNOSIS — R2681 Unsteadiness on feet: Secondary | ICD-10-CM | POA: Diagnosis present

## 2017-07-19 DIAGNOSIS — R296 Repeated falls: Secondary | ICD-10-CM | POA: Diagnosis present

## 2017-07-19 DIAGNOSIS — R2689 Other abnormalities of gait and mobility: Secondary | ICD-10-CM | POA: Diagnosis present

## 2017-07-20 NOTE — Therapy (Signed)
[facility] [address] [place], [place], [address] Phone: [phone]   Fax:  [phone]  Physical Therapy Evaluation  Patient Details  Name: Elaine Rivera MRN: [id number] Date of Birth: July 06, 1946 Referring Provider: Carol Ada, MD  Encounter Date: 07/19/2017      PT End of Session - 07/19/17 1835    Visit Number 1   Number of Visits 18   Date for PT Re-Evaluation 09/17/17   Authorization Type [employer] Medicare G-code & progress note every 10 visits   PT Start Time 1533   PT Stop Time 1612   PT Time Calculation (min) 39 min   Equipment Utilized During Treatment Gait belt   Activity Tolerance Patient tolerated treatment well;Patient limited by pain   Behavior During Therapy [facility] for tasks assessed/performed      Past Medical History:  Diagnosis Date  . Basal cell carcinoma of eye    biopsy of left eye/ non cancerous  . Complication of anesthesia    nausea/vomiting  . Depression   . Hypertension   . Kidney stones    twice/ [place]    Past Surgical History:  Procedure Laterality Date  . CEREBRAL ANEURYSM REPAIR     1998  . ECTOPIC PREGNANCY SURGERY     x2   . ESOPHAGOGASTRODUODENOSCOPY N/A 04/22/2016   Procedure: ESOPHAGOGASTRODUODENOSCOPY (EGD);  Surgeon: Juanita Craver, MD;  Location: [facility] ENDOSCOPY;  Service: Endoscopy;  Laterality: N/A;  . PARATHYROIDECTOMY     2002  . TONSILLECTOMY     1953  . VAGINAL HYSTERECTOMY     2001  . VOCAL CORD LATERALIZATION, ENDOSCOPIC APPROACH W/ MLB     2007 (@[facility] )  . WRIST SURGERY     left side/2008  . WRIST SURGERY     right side/ 2002    There were no vitals filed for this visit.       Subjective Assessment - 07/19/17 1538    Subjective This 71yo female was referred onb 07/12/17 by Carol Ada, MD for Physical Therapy with diagnosis of imbalance.    Pertinent History HTN, depressive disorder, osteopenia, bil wrist fx's with falls left '08 & right  '02, idiopathic thoracic scoliosis, LBP,    Limitations Lifting;Walking;Standing;House hold activities   Patient Stated Goals Walk & stand without significant pain.    Currently in Pain? Yes   Pain Score 4   In last week, worst 9/10, best 1-2/10   Pain Location Back   Pain Orientation Lower   Pain Descriptors / Indicators Aching;Spasm   Pain Type Chronic pain   Pain Onset More than a month ago   Pain Frequency Constant   Aggravating Factors  standing 5-10 minutes & walking   Pain Relieving Factors laying down & bringing knees to chest   Effect of Pain on Daily Activities limits standing & walking,    Multiple Pain Sites Yes   Pain Score 1  in last week, worst 5/10, best 0/10   Pain Location Neck   Pain Orientation Posterior;Mid   Pain Descriptors / Indicators Aching   Pain Type Chronic pain   Pain Onset More than a month ago   Pain Frequency Intermittent   Aggravating Factors  unknown   Pain Relieving Factors laying down            [facility] PT Assessment - 07/19/17 1530      Assessment   Medical Diagnosis Imbalance   Referring Provider Carol Ada, MD   Onset Date/Surgical Date  07/12/17  Pt referral   Prior Therapy [facility] ~6 weeks ended ~10/2     Precautions   Precautions Fall     Restrictions   Weight Bearing Restrictions No     Balance Screen   Has the patient fallen in the past 6 months Yes   How many times? 3  getting up at night   Has the patient had a decrease in activity level because of a fear of falling?  Yes   Is the patient reluctant to leave their home because of a fear of falling?  No     Home Environment   Living Environment Private residence   Living Arrangements Alone   Type of [place] to enter   Entrance Stairs-Number of Steps 3   Entrance Stairs-Rails Right;Left;Cannot reach both   [place] One level   [place] - 4 wheels;Cane - single point;Shower seat     Prior Function   Level of Independence  Independent;Independent with household mobility without device;Independent with community mobility without device   Vocation Retired   Leisure crafts, read, movies,      Observation/Other Assessments   Focus on Therapeutic Outcomes (FOTO)  40.27 Functional Status   Activities of Balance Confidence Scale (ABC Scale)  39.4%     Posture/Postural Control   Posture/Postural Control Postural limitations   Postural Limitations Rounded Shoulders;Forward head;Increased thoracic kyphosis;Flexed trunk;Weight shift right  scoliosis S-curve with occipital 1" right of sacrum    Posture Comments severe scoliosis that patient reports has increased with age     ROM / Strength   AROM / PROM / Strength Strength;AROM     AROM   Overall AROM  Deficits   Overall AROM Comments Bil shoulder flexion right 90*, left 80*     Strength   Overall Strength Deficits   Overall Strength Comments bil shoulders grossly 3-/5   Strength Assessment Site Hip;Knee;Ankle   Right/Left Hip Right;Left   Right Hip Flexion 4/5   Right Hip Extension 2/5   Right Hip ABduction 3-/5   Left Hip Flexion 4/5   Left Hip Extension 2/5   Left Hip ABduction 3-/5   Right/Left Knee Right;Left   Right Knee Flexion 3+/5   Right Knee Extension 4/5   Left Knee Flexion 3+/5   Left Knee Extension 4/5   Right/Left Ankle Right;Left   Right Ankle Dorsiflexion 4/5   Left Ankle Dorsiflexion 3+/5     Transfers   Transfers Sit to Stand;Stand to Sit   Sit to Stand 5: Supervision;Without upper extremity assist;From chair/3-in-1   Stand to Sit 5: Supervision;With upper extremity assist;To chair/3-in-1;With armrests     Ambulation/Gait   Ambulation/Gait Yes   Ambulation/Gait Assistance 5: Supervision   Ambulation Distance (Feet) 200 Feet   Assistive device None   Gait Pattern Step-through pattern;Decreased stance time - left;Decreased arm swing - right;Decreased step length - right;Decreased stride length;Decreased dorsiflexion -  left;Decreased weight shift to left;Antalgic;Trunk flexed   Ambulation Surface Indoor;Level   Gait velocity 2.22 ft/sec     Standardized Balance Assessment   Standardized Balance Assessment Berg Balance Test;Timed Up and Go Test     Berg Balance Test   Sit to Stand Able to stand  independently using hands   Standing Unsupported Able to stand safely 2 minutes   Sitting with Back Unsupported but Feet Supported on Floor or Stool Able to sit safely and securely 2 minutes   Stand to Sit Controls descent by  using hands   Transfers Able to transfer safely, definite need of hands   Standing Unsupported with Eyes Closed Able to stand 10 seconds with supervision   Standing Ubsupported with Feet Together Able to place feet together independently but unable to hold for 30 seconds   From Standing, Reach Forward with Outstretched Arm Can reach forward >12 cm safely (5")   From Standing Position, Pick up Object from Floor Able to pick up shoe, needs supervision   From Standing Position, Turn to Look Behind Over each Shoulder Looks behind one side only/other side shows less weight shift   Turn 360 Degrees Able to turn 360 degrees safely but slowly   Standing Unsupported, Alternately Place Feet on Step/Stool Able to complete >2 steps/needs minimal assist   Standing Unsupported, One Foot in Front Able to take small step independently and hold 30 seconds   Standing on One Leg Able to lift leg independently and hold equal to or more than 3 seconds   Total Score 38     Timed Up and Go Test   Normal TUG (seconds) 14.43  >13.5sec indicates high fall risk   Cognitive TUG (seconds) 26.94  stops naming with gait, 87% inc. from std TUG   TUG Comments High fall risk with distraction as noted by cognitive TUG >15sec, 87% increase & stops naming.             Objective measurements completed on examination: See above findings.                    PT Short Term Goals - 07/19/17 1852      PT  SHORT TERM GOAL #1   Title Patient is independent with inital HEP. (All STGs Target Date: 08/18/17)   Time 1   Period Months   Status New   Target Date 08/18/17     PT SHORT TERM GOAL #2   Title Patient is able to ambulate around furniture & carry conversation without significant balance losses requiring physical assist or touch on furniture.    Time 1   Period Months   Status New   Target Date 08/18/17     PT SHORT TERM GOAL #3   Title Standard Timed Up & Go <13.5sec   Time 1   Period Months   Status New   Target Date 08/18/17     PT SHORT TERM GOAL #4   Title Patient verbalizes understanding of fall prevention strategies including use of assistive device to decrease back pain.    Time 1   Period Months   Status New   Target Date 08/18/17           PT Long Term Goals - 07/19/17 1847      PT LONG TERM GOAL #1   Title Patient verbalizes & demonstrates understanding of ongoing fitness plan. (All LTGs Target Date: 09/17/17)   Time 2   Period Months   Status New   Target Date 09/17/17     PT LONG TERM GOAL #2   Title Patient reports pain increases </= 2 increments on 0-10 scale with standing & gait activities.    Time 2   Period Months   Status New   Target Date 09/17/17     PT LONG TERM GOAL #3   Title Berg Balance Test >/= 45/56   Time 2   Period Months   Status New   Target Date 09/17/17     PT LONG TERM GOAL #4  Title Timed Up-Go cognitive with continuous naming tasks and increase <50% from standard TUG.    Time 2   Period Months   Status New   Target Date 09/17/17     PT LONG TERM GOAL #5   Title Patient ambulates 500' with LRAD modified independent for community mobility.   Time 2   Period Months   Status New   Target Date 09/17/17     Additional Long Term Goals   Additional Long Term Goals Yes     PT LONG TERM GOAL #6   Title Patient negotiates ramps, curbs & stairs with LRAD modified independent for community access.    Time 2   Period  Months   Status New   Target Date 09/17/17     PT LONG TERM GOAL #7   Title Activities of Balance Confidence increases to >/= 45%   Time 2   Period Months   Status New   Target Date 09/17/17                Plan - 07/20/17 0843    Clinical Impression Statement This 71yo female was referred onb 07/12/17 by Carol Ada, MD for Physical Therapy with diagnosis of imbalance. She has impaired posture with idiopathic scoliosis that she reports has increased with age. She reports 3 falls in last 6 months with osteopenia & history of wrist fractures from falls in past. Patient has chronic back & neck pain that increases with standing & gait activities. She has weakness in all 4 extremities negatively impacting function. Her Berg Balance test 38/56, Timed Up-Go 14.43sec and cognitive TUG 26.94sec (87% increase with stopping naming) all indicate high fall risk. Patient's gait has deficits indicating fall risk & gait velocity of 2.22 ft/sec limiting community mobility. Patient also reported  dizziness with some activities that PT will assess further. Patient appears would benefit from skilled PT to improve mobility & safety.   History and Personal Factors relevant to plan of care: HTN, depressive disorder, osteopenia, bil wrist fx's with falls left '08 & right '02, idiopathic thoracic scoliosis, LBP,    Clinical Presentation Evolving   Clinical Presentation due to: scoliosis with S-curve & abnormal posture, pain limiting standing & gait, dizziness, weakness 4 extremities   Clinical Decision Making Moderate   Rehab Potential Good   Clinical Impairments Affecting Rehab Potential HTN, depressive disorder, osteopenia, bil wrist fx's with falls left '08 & right '02, idiopathic thoracic scoliosis, LBP,    PT Frequency 2x / week   PT Duration Other (comment)  9 weeks (60 days)   PT Treatment/Interventions ADLs/Self Care Home Management;DME Instruction;Gait training;Moist Heat;Ultrasound;Canalith  Repostioning;Stair training;Functional mobility training;Cryotherapy;Therapeutic activities;Therapeutic exercise;Balance training;Neuromuscular re-education;Patient/family education;Orthotic Fit/Training;Vestibular   PT Next Visit Plan assess dizziness, orthostatic hypotension assessment, initiate HEP including flexibility, strength, balance   Consulted and Agree with Plan of Care Patient      Patient will benefit from skilled therapeutic intervention in order to improve the following deficits and impairments:  Abnormal gait, Decreased activity tolerance, Decreased balance, Decreased endurance, Decreased knowledge of use of DME, Decreased mobility, Decreased range of motion, Decreased strength, Postural dysfunction, Pain, Impaired flexibility  Visit Diagnosis: Muscle weakness (generalized)  Repeated falls  Unsteadiness on feet  Other abnormalities of gait and mobility  Abnormal posture  Dizziness and giddiness      G-Codes - Aug 05, 2017 1856    Functional Assessment Tool Used (Outpatient Only) Timed Up-Go 14.43, Cognitive TUG 26.94sec (87% increase & stops naming)   Functional Limitation  Mobility: Walking and moving around   Mobility: Walking and Moving Around Current Status [phone]) At least 40 percent but less than 60 percent impaired, limited or restricted   Mobility: Walking and Moving Around Goal Status [phone]) At least 20 percent but less than 40 percent impaired, limited or restricted       Problem List Patient Active Problem List   Diagnosis Date Noted  . Symptomatic anemia 04/20/2016  . NSAID long-term use 04/20/2016  . Scoliosis 04/20/2016  . Depression 04/20/2016  . HTN (hypertension) 04/20/2016  . GERD (gastroesophageal reflux disease) 04/20/2016  . Occult GI bleeding 04/20/2016    Jamey Reas PT, DPT 07/20/2017, 8:58 AM  [facility] [address] [place], [place], [address] Phone: [phone]    Fax:  [phone]  Name: EBBIE SORENSON MRN: [id number] Date of Birth: November 14, 1945

## 2017-07-21 ENCOUNTER — Ambulatory Visit: Payer: Medicare Other | Admitting: Physical Therapy

## 2017-07-21 DIAGNOSIS — M6281 Muscle weakness (generalized): Secondary | ICD-10-CM

## 2017-07-21 DIAGNOSIS — R2681 Unsteadiness on feet: Secondary | ICD-10-CM

## 2017-07-21 DIAGNOSIS — R293 Abnormal posture: Secondary | ICD-10-CM

## 2017-07-21 DIAGNOSIS — R2689 Other abnormalities of gait and mobility: Secondary | ICD-10-CM

## 2017-07-21 DIAGNOSIS — R42 Dizziness and giddiness: Secondary | ICD-10-CM

## 2017-07-21 DIAGNOSIS — R296 Repeated falls: Secondary | ICD-10-CM

## 2017-07-21 NOTE — Patient Instructions (Signed)
Gaze Stabilization: Sitting    Keeping eyes on target on wall 3 feet away, and move head side to side for _30__ seconds. Repeat while moving head up and down for __30_ seconds. Do __2-3__ sessions per day.  Copyright  VHI. All rights reserved.   Gaze Stabilization: Tip Card  1.Target must remain in focus, not blurry, and appear stationary while head is in motion. 2.Perform exercises with small head movements (45 to either side of midline). 3.Increase speed of head motion so long as target is in focus. 4.If you wear eyeglasses, be sure you can see target through lens (therapist will give specific instructions for bifocal / progressive lenses). 5.These exercises may provoke dizziness or nausea. Work through these symptoms. If too dizzy, slow head movement slightly. Rest between each exercise. 6.Exercises demand concentration; avoid distractions.  Copyright  VHI. All rights reserved.     

## 2017-07-21 NOTE — Therapy (Signed)
[facility] [address] [name], [place], [address] Phone: [phone]   Fax:  [phone]  Physical Therapy Treatment  Patient Details  Name: Elaine Rivera MRN: [id number] Date of Birth: 10/17/1945 Referring Provider: Carol Ada, MD  Encounter Date: 07/21/2017      PT End of Session - 07/21/17 1626    Visit Number 2   Number of Visits 18   Date for PT Re-Evaluation 09/17/17   Authorization Type [employer] G-code & progress note every 10 visits   PT Start Time 1448   PT Stop Time 1535   PT Time Calculation (min) 47 min   Activity Tolerance Patient tolerated treatment well   Behavior During Therapy [facility] for tasks assessed/performed      Past Medical History:  Diagnosis Date  . Basal cell carcinoma of eye    biopsy of left eye/ non cancerous  . Complication of anesthesia    nausea/vomiting  . Depression   . Hypertension   . Kidney stones    twice/ [place]    Past Surgical History:  Procedure Laterality Date  . CEREBRAL ANEURYSM REPAIR     1998  . ECTOPIC PREGNANCY SURGERY     x2   . ESOPHAGOGASTRODUODENOSCOPY N/A 04/22/2016   Procedure: ESOPHAGOGASTRODUODENOSCOPY (EGD);  Surgeon: Juanita Craver, MD;  Location: [facility];  Service: Endoscopy;  Laterality: N/A;  . PARATHYROIDECTOMY     2002  . TONSILLECTOMY     1953  . VAGINAL HYSTERECTOMY     2001  . VOCAL CORD LATERALIZATION, ENDOSCOPIC APPROACH W/ MLB     2007 (@[facility] )  . WRIST SURGERY     left side/2008  . WRIST SURGERY     right side/ 2002    There were no vitals filed for this visit.      Subjective Assessment - 07/21/17 1450    Subjective No falls or issues since Monday.  No pain today.   Pertinent History HTN, depressive disorder, osteopenia, bil wrist fx's with falls left '08 & right '02, idiopathic thoracic scoliosis, LBP; has been a while since she has had a bone density assessment   Limitations  Lifting;Walking;Standing;House hold activities   Patient Stated Goals Walk & stand without significant pain.    Currently in Pain? No/denies   Pain Onset More than a month ago   Pain Onset More than a month ago                Vestibular Assessment - 07/21/17 1451      Vestibular Assessment   General Observation significant scoliosis; has history of positional vertigo but this is different     Symptom Behavior   Type of Dizziness Imbalance   Frequency of Dizziness intermittently   Duration of Dizziness short duration   Aggravating Factors Turning body quickly   Relieving Factors Slow movements     Occulomotor Exam   Occulomotor Alignment Normal   Spontaneous Absent   Gaze-induced Absent   Smooth Pursuits Saccades   Saccades Slow   Comment Convergence intact     Vestibulo-Occular Reflex   VOR 1 Head Only (x 1 viewing) impaired   VOR to Slow Head Movement Normal   Comment HIT: + for refixation saccade to L     Positional Sensitivities   Nose to Right Knee No dizziness   Right Knee to Sitting No dizziness   Nose to Left Knee No dizziness   Left Knee to Sitting Lightheadedness   Head  Turning x 5 Lightheadedness  standing1   Head Nodding x 5 Lightheadedness  standing   Pivot Right in Standing Mild dizziness   Pivot Left in Standing Mild dizziness                 [facility] Adult PT Treatment/Exercise - 07/21/17 1645      Self-Care   Self-Care Other Self-Care Comments   Other Self-Care Comments  discussed use of written schedule to use each day to set daily routine to improve mood and motivation to perform daily activities and exercises due to pt reporting low motivation due to depression         Vestibular Treatment/Exercise - 07/21/17 1526      Vestibular Treatment/Exercise   Vestibular Treatment Provided Gaze   Gaze Exercises X1 Viewing Horizontal;X1 Viewing Vertical     X1 Viewing Horizontal   Foot Position sitting with back and arm support    Reps 2   Comments 30 seconds each; limited ROM with cervical rotation to L     X1 Viewing Vertical   Foot Position sitting with back and arm support   Reps 2   Comments 30 seconds each               PT Education - 07/21/17 1625    Education provided Yes   Education Details VOR gaze adaptation exercise in sitting   Person(s) Educated Patient   Methods Explanation;Demonstration;Handout   Comprehension Verbalized understanding;Returned demonstration          PT Short Term Goals - 07/21/17 1639      PT SHORT TERM GOAL #1   Title Patient is independent with inital HEP. (All STGs Target Date: 08/18/17)   Time 1   Period Months   Status On-going   Target Date 08/18/17     PT SHORT TERM GOAL #2   Title Patient is able to ambulate around furniture & carry conversation without significant balance losses requiring physical assist or touch on furniture.    Time 1   Period Months   Status On-going   Target Date 08/18/17     PT SHORT TERM GOAL #3   Title Standard Timed Up & Go <13.5sec   Time 1   Period Months   Status On-going   Target Date 08/18/17     PT SHORT TERM GOAL #4   Title Patient verbalizes understanding of fall prevention strategies including use of assistive device to decrease back pain.    Time 1   Period Months   Status On-going   Target Date 08/18/17     PT SHORT TERM GOAL #5   Title Pt will demonstrate improved neck rotation ROM (to L) to allow for improved use of VOR as demonstrated by ability to perform x 1 viewing in standing with feet apart x 30 seconds each with supervision   Time 1   Period Months   Status New   Target Date 08/18/17           PT Long Term Goals - 07/21/17 1642      PT LONG TERM GOAL #1   Title Patient verbalizes & demonstrates understanding of ongoing fitness plan. (All LTGs Target Date: 09/17/17)   Time 2   Period Months   Status On-going   Target Date 09/17/17     PT LONG TERM GOAL #2   Title Patient reports  pain increases </= 2 increments on 0-10 scale with standing & gait activities.    Time 2  Period Months   Status On-going   Target Date 09/17/17     PT LONG TERM GOAL #3   Title Berg Balance Test >/= 45/56   Time 2   Period Months   Status On-going   Target Date 09/17/17     PT LONG TERM GOAL #4   Title Timed Up-Go cognitive with continuous naming tasks and increase <50% from standard TUG.    Time 2   Period Months   Status On-going   Target Date 09/17/17     PT LONG TERM GOAL #5   Title Patient ambulates 500' with LRAD modified independent for community mobility.   Time 2   Period Months   Status On-going   Target Date 09/17/17     Additional Long Term Goals   Additional Long Term Goals Yes     PT LONG TERM GOAL #6   Title Patient negotiates ramps, curbs & stairs with LRAD modified independent for community access.    Time 2   Period Months   Status On-going   Target Date 09/17/17     PT LONG TERM GOAL #7   Title Activities of Balance Confidence increases to >/= 45%   Time 2   Period Months   Status On-going   Target Date 09/17/17     PT LONG TERM GOAL #8   Title Pt will improve use of VOR/gaze stability as indicated by ability to perform x 1 viewing in standing x 60 seconds and reports 0/5 dizziness with head turns, nods and pivoting   Time 2   Period Months   Status New   Target Date 09/17/17               Plan - 07/21/17 1627    Clinical Impression Statement Treatment session today focused on assessment of vestibular system and it's affect on pt's LOB.  Pt does present with limited neck ROM, most restricted with L rotation.  Pt also presents with dysequilibrium, motion sensitivity and impaired VOR with refixation saccade with head impulse to L side.  Pt provided with gaze adaptation in sitting but will likely need gentle neck ROM and strengthening to allow pt greater ROM to L side for increased stimulation of L vestibular organ.  Will continue to  assess and address.   Rehab Potential Good   Clinical Impairments Affecting Rehab Potential HTN, depressive disorder, osteopenia, bil wrist fx's with falls left '08 & right '02, idiopathic thoracic scoliosis, LBP,    PT Frequency 2x / week   PT Duration Other (comment)  9 weeks (60 days)   PT Treatment/Interventions ADLs/Self Care Home Management;DME Instruction;Gait training;Moist Heat;Ultrasound;Canalith Repostioning;Stair training;Functional mobility training;Cryotherapy;Therapeutic activities;Therapeutic exercise;Balance training;Neuromuscular re-education;Patient/family education;Orthotic Fit/Training;Vestibular   PT Next Visit Plan  did not get to assess orthostatic hypotension assessment-please assess at next visit.  Also assess neck ROM.  needs to work on gentle stretches and contract-relax for L rotation; initiate HEP including flexibility, strength, balance   Consulted and Agree with Plan of Care Patient      Patient will benefit from skilled therapeutic intervention in order to improve the following deficits and impairments:  Abnormal gait, Decreased activity tolerance, Decreased balance, Decreased endurance, Decreased knowledge of use of DME, Decreased mobility, Decreased range of motion, Decreased strength, Postural dysfunction, Pain, Impaired flexibility  Visit Diagnosis: Muscle weakness (generalized)  Repeated falls  Unsteadiness on feet  Other abnormalities of gait and mobility  Abnormal posture  Dizziness and giddiness     Problem List Patient  Active Problem List   Diagnosis Date Noted  . Symptomatic anemia 04/20/2016  . NSAID long-term use 04/20/2016  . Scoliosis 04/20/2016  . Depression 04/20/2016  . HTN (hypertension) 04/20/2016  . GERD (gastroesophageal reflux disease) 04/20/2016  . Occult GI bleeding 04/20/2016    Rico Junker, PT, DPT 07/21/17    4:47 PM    [facility] [address] [place], [place], [address] Phone: [phone]   Fax:  [phone]  Name: DAVIA SMYRE MRN: [id number] Date of Birth: 06/16/1946

## 2017-07-27 ENCOUNTER — Ambulatory Visit: Payer: Medicare Other | Admitting: Physical Therapy

## 2017-07-27 ENCOUNTER — Encounter: Payer: Self-pay | Admitting: Physical Therapy

## 2017-07-27 DIAGNOSIS — R293 Abnormal posture: Secondary | ICD-10-CM

## 2017-07-27 DIAGNOSIS — R42 Dizziness and giddiness: Secondary | ICD-10-CM

## 2017-07-27 DIAGNOSIS — M6281 Muscle weakness (generalized): Secondary | ICD-10-CM

## 2017-07-27 DIAGNOSIS — R2681 Unsteadiness on feet: Secondary | ICD-10-CM

## 2017-07-27 DIAGNOSIS — R2689 Other abnormalities of gait and mobility: Secondary | ICD-10-CM

## 2017-07-27 NOTE — Patient Instructions (Addendum)
Axial Extension (Chin Tuck)    Pull chin in and lengthen back of neck. Hold __5__ seconds while counting out loud. Repeat _10___ times. Do __2-3__ sessions per day.  http://gt2.exer.us/450   Copyright  VHI. All rights reserved.  Neck Rotation    Begin with chin level, head centered over spine. Slowly turn head to look over shoulder, keeping head centered, shoulders and torso stationary.  Hold __3-5__ seconds. Slowly return to starting position. Repeat to other side. Repeat _10___ times to each side.  Copyright  VHI. All rights reserved.     Fall Prevention in the Home Falls can cause injuries and can affect people from all age groups. There are many simple things that you can do to make your home safe and to help prevent falls. What can I do on the outside of my home?  Regularly repair the edges of walkways and driveways and fix any cracks.  Remove high doorway thresholds.  Trim any shrubbery on the main path into your home.  Use bright outdoor lighting.  Clear walkways of debris and clutter, including tools and rocks.  Regularly check that handrails are securely fastened and in good repair. Both sides of any steps should have handrails.  Install guardrails along the edges of any raised decks or porches.  Have leaves, snow, and ice cleared regularly.  Use sand or salt on walkways during winter months.  In the garage, clean up any spills right away, including grease or oil spills. What can I do in the bathroom?  Use night lights.  Install grab bars by the toilet and in the tub and shower. Do not use towel bars as grab bars.  Use non-skid mats or decals on the floor of the tub or shower.  If you need to sit down while you are in the shower, use a plastic, non-slip stool.  Keep the floor dry. Immediately clean up any water that spills on the floor.  Remove soap buildup in the tub or shower on a regular basis.  Attach bath mats securely with double-sided  non-slip rug tape.  Remove throw rugs and other tripping hazards from the floor. What can I do in the bedroom?  Use night lights.  Make sure that a bedside light is easy to reach.  Do not use oversized bedding that drapes onto the floor.  Have a firm chair that has side arms to use for getting dressed.  Remove throw rugs and other tripping hazards from the floor. What can I do in the kitchen?  Clean up any spills right away.  Avoid walking on wet floors.  Place frequently used items in easy-to-reach places.  If you need to reach for something above you, use a sturdy step stool that has a grab bar.  Keep electrical cables out of the way.  Do not use floor polish or wax that makes floors slippery. If you have to use wax, make sure that it is non-skid floor wax.  Remove throw rugs and other tripping hazards from the floor. What can I do in the stairways?  Do not leave any items on the stairs.  Make sure that there are handrails on both sides of the stairs. Fix handrails that are broken or loose. Make sure that handrails are as long as the stairways.  Check any carpeting to make sure that it is firmly attached to the stairs. Fix any carpet that is loose or worn.  Avoid having throw rugs at the top or bottom of stairways,  or secure the rugs with carpet tape to prevent them from moving.  Make sure that you have a light switch at the top of the stairs and the bottom of the stairs. If you do not have them, have them installed. What are some other fall prevention tips?  Wear closed-toe shoes that fit well and support your feet. Wear shoes that have rubber soles or low heels.  When you use a stepladder, make sure that it is completely opened and that the sides are firmly locked. Have someone hold the ladder while you are using it. Do not climb a closed stepladder.  Add color or contrast paint or tape to grab bars and handrails in your home. Place contrasting color strips on the  first and last steps.  Use mobility aids as needed, such as canes, walkers, scooters, and crutches.  Turn on lights if it is dark. Replace any light bulbs that burn out.  Set up furniture so that there are clear paths. Keep the furniture in the same spot.  Fix any uneven floor surfaces.  Choose a carpet design that does not hide the edge of steps of a stairway.  Be aware of any and all pets.  Review your medicines with your healthcare provider. Some medicines can cause dizziness or changes in blood pressure, which increase your risk of falling. Talk with your health care provider about other ways that you can decrease your risk of falls. This may include working with a physical therapist or trainer to improve your strength, balance, and endurance. This information is not intended to replace advice given to you by your health care provider. Make sure you discuss any questions you have with your health care provider. Document Released: 09/04/2002 Document Revised: 02/11/2016 Document Reviewed: 10/19/2014 Elsevier Interactive Patient Education  2017 [employer]   Orthostatic Hypotension Orthostatic hypotension is a sudden drop in blood pressure that happens when you quickly change positions, such as when you get up from a seated or lying position. Blood pressure is a measurement of how strongly, or weakly, your blood is pressing against the walls of your arteries. Arteries are blood vessels that carry blood from your heart throughout your body. When blood pressure is too low, you may not get enough blood to your brain or to the rest of your organs. This can cause weakness, light-headedness, rapid heartbeat, and fainting. This can last for just a few seconds or for up to a few minutes. Orthostatic hypotension is usually not a serious problem. However, if it happens frequently or gets worse, it may be a sign of something more serious. What are the causes? This condition may be caused  by:  Sudden changes in posture, such as standing up quickly after you have been sitting or lying down.  Blood loss.  Loss of body fluids (dehydration).  Heart problems.  Hormone (endocrine) problems.  Pregnancy.  Severe infection.  Lack of certain nutrients.  Severe allergic reactions (anaphylaxis).  Certain medicines, such as blood pressure medicine or medicines that make the body lose excess fluids (diuretics). Sometimes, this condition can be caused by not taking medicine as directed, such as taking too much of a certain medicine.  What increases the risk? Certain factors can make you more likely to develop orthostatic hypotension, including:  Age. Risk increases as you get older.  Conditions that affect the heart or the central nervous system.  Taking certain medicines, such as blood pressure medicine or diuretics.  Being pregnant.  What are the  signs or symptoms? Symptoms of this condition may include:  Weakness.  Light-headedness.  Dizziness.  Blurred vision.  Fatigue.  Rapid heartbeat.  Fainting, in severe cases.  How is this diagnosed? This condition is diagnosed based on:  Your medical history.  Your symptoms.  Your blood pressure measurement. Your health care provider will check your blood pressure when you are: ? Lying down. ? Sitting. ? Standing.  A blood pressure reading is recorded as two numbers, such as "120 over 80" (or 120/80). The first ("top") number is called the systolic pressure. It is a measure of the pressure in your arteries as your heart beats. The second ("bottom") number is called the diastolic pressure. It is a measure of the pressure in your arteries when your heart relaxes between beats. Blood pressure is measured in a unit called mm Hg. Healthy blood pressure for adults is 120/80. If your blood pressure is below 90/60, you may be diagnosed with hypotension. Other information or tests that may be used to diagnose  orthostatic hypotension include:  Your other vital signs, such as your heart rate and temperature.  Blood tests.  Tilt table test. For this test, you will be safely secured to a table that moves you from a lying position to an upright position. Your heart rhythm and blood pressure will be monitored during the test.  How is this treated? Treatment for this condition may include:  Changing your diet. This may involve eating more salt (sodium) or drinking more water.  Taking medicines to raise your blood pressure.  Changing the dosage of certain medicines you are taking that might be lowering your blood pressure.  Wearing compression stockings. These stockings help to prevent blood clots and reduce swelling in your legs.  In some cases, you may need to go to the hospital for:  Fluid replacement. This means you will receive fluids through an IV tube.  Blood replacement. This means you will receive donated blood through an IV tube (transfusion).  Treating an infection or heart problems, if this applies.  Monitoring. You may need to be monitored while medicines that you are taking wear off.  Follow these instructions at home: Eating and drinking   Drink enough fluid to keep your urine clear or pale yellow.  Eat a healthy diet and follow instructions from your health care provider about eating or drinking restrictions. A healthy diet includes: ? Fresh fruits and vegetables. ? Whole grains. ? Lean meats. ? Low-fat dairy products.  Eat extra salt only as directed. Do not add extra salt to your diet unless your health care provider told you to do that.  Eat frequent, small meals.  Avoid standing up suddenly after eating. Medicines  Take over-the-counter and prescription medicines only as told by your health care provider. ? Follow instructions from your health care provider about changing the dosage of your current medicines, if this applies. ? Do not stop or adjust any of  your medicines on your own. General instructions  Wear compression stockings as told by your health care provider.  Get up slowly from lying down or sitting positions. This gives your blood pressure a chance to adjust.  Avoid hot showers and excessive heat as directed by your health care provider.  Return to your normal activities as told by your health care provider. Ask your health care provider what activities are safe for you.  Do not use any products that contain nicotine or tobacco, such as cigarettes and e-cigarettes. If you need  help quitting, ask your health care provider.  Keep all follow-up visits as told by your health care provider. This is important. Contact a health care provider if:  You vomit.  You have diarrhea.  You have a fever for more than 2-3 days.  You feel more thirsty than usual.  You feel weak and tired. Get help right away if:  You have chest pain.  You have a fast or irregular heartbeat.  You develop numbness in any part of your body.  You cannot move your arms or your legs.  You have trouble speaking.  You become sweaty or feel lightheaded.  You faint.  You feel short of breath.  You have trouble staying awake.  You feel confused. This information is not intended to replace advice given to you by your health care provider. Make sure you discuss any questions you have with your health care provider. Document Released: 09/04/2002 Document Revised: 06/02/2016 Document Reviewed: 03/06/2016 Elsevier Interactive Patient Education  2018 [employer].

## 2017-07-27 NOTE — Therapy (Signed)
[facility] [address] [place], [place], [address] Phone: [phone]   Fax:  [phone]  Physical Therapy Treatment  Patient Details  Name: Elaine Rivera MRN: [id number] Date of Birth: 1946-03-03 Referring Provider: Carol Ada, MD  Encounter Date: 07/27/2017      PT End of Session - 07/27/17 2151    Visit Number 3   Number of Visits 18   Date for PT Re-Evaluation 09/17/17   Authorization Type [employer] Medicare G-code & progress note every 10 visits   PT Start Time 4008   PT Stop Time 1530   PT Time Calculation (min) 45 min   Activity Tolerance Patient tolerated treatment well   Behavior During Therapy [facility] for tasks assessed/performed      Past Medical History:  Diagnosis Date  . Basal cell carcinoma of eye    biopsy of left eye/ non cancerous  . Complication of anesthesia    nausea/vomiting  . Depression   . Hypertension   . Kidney stones    twice/ [place]    Past Surgical History:  Procedure Laterality Date  . CEREBRAL ANEURYSM REPAIR     1998  . ECTOPIC PREGNANCY SURGERY     x2   . ESOPHAGOGASTRODUODENOSCOPY N/A 04/22/2016   Procedure: ESOPHAGOGASTRODUODENOSCOPY (EGD);  Surgeon: Juanita Craver, MD;  Location: [facility];  Service: Endoscopy;  Laterality: N/A;  . PARATHYROIDECTOMY     2002  . TONSILLECTOMY     1953  . VAGINAL HYSTERECTOMY     2001  . VOCAL CORD LATERALIZATION, ENDOSCOPIC APPROACH W/ MLB     2007 (@[facility] )  . WRIST SURGERY     left side/2008  . WRIST SURGERY     right side/ 2002    There were no vitals filed for this visit.      Subjective Assessment - 07/27/17 1454    Subjective No falls. She has been doing the eye exercises.    Pertinent History HTN, depressive disorder, osteopenia, bil wrist fx's with falls left '08 & right '02, idiopathic thoracic scoliosis, LBP; has been a while since she has had a bone density assessment   Limitations  Lifting;Walking;Standing;House hold activities   Patient Stated Goals Walk & stand without significant pain.    Currently in Pain? Yes   Pain Score 5    Pain Location Back   Pain Orientation Lower   Pain Descriptors / Indicators Aching;Spasm   Pain Type Chronic pain   Pain Onset More than a month ago   Pain Frequency Constant   Aggravating Factors  standing 5-10 minutes & walking, getting up after being in bed or sitting for a while   Pain Relieving Factors laying down & bringing knees to chest.    Effect of Pain on Daily Activities limits standing & walking.                Vestibular Assessment - 07/27/17 1445      Orthostatics   BP supine (x 5 minutes) 136/78   HR supine (x 5 minutes) 91   BP standing (after 1 minute) 108/73  dizzy   HR standing (after 1 minute) 114   BP standing (after 3 minutes) 128/84   HR standing (after 3 minutes) 111   Orthostatics Comment dizzy upon standing     Self-care: Pt education in fall prevention strategies & orthostatic hypotension, positioning supine in bed.  Therapeutic exercise: verbally reviewed eye exercises. Added cervical retraction with mirror for visual feedback  and closed chain with pillow behind head and rotation maintaining cervical retraction.                        PT Short Term Goals - 07/21/17 1639      PT SHORT TERM GOAL #1   Title Patient is independent with inital HEP. (All STGs Target Date: 08/18/17)   Time 1   Period Months   Status On-going   Target Date 08/18/17     PT SHORT TERM GOAL #2   Title Patient is able to ambulate around furniture & carry conversation without significant balance losses requiring physical assist or touch on furniture.    Time 1   Period Months   Status On-going   Target Date 08/18/17     PT SHORT TERM GOAL #3   Title Standard Timed Up & Go <13.5sec   Time 1   Period Months   Status On-going   Target Date 08/18/17     PT SHORT TERM GOAL #4   Title Patient  verbalizes understanding of fall prevention strategies including use of assistive device to decrease back pain.    Time 1   Period Months   Status On-going   Target Date 08/18/17     PT SHORT TERM GOAL #5   Title Pt will demonstrate improved neck rotation ROM (to L) to allow for improved use of VOR as demonstrated by ability to perform x 1 viewing in standing with feet apart x 30 seconds each with supervision   Time 1   Period Months   Status New   Target Date 08/18/17           PT Long Term Goals - 07/21/17 1642      PT LONG TERM GOAL #1   Title Patient verbalizes & demonstrates understanding of ongoing fitness plan. (All LTGs Target Date: 09/17/17)   Time 2   Period Months   Status On-going   Target Date 09/17/17     PT LONG TERM GOAL #2   Title Patient reports pain increases </= 2 increments on 0-10 scale with standing & gait activities.    Time 2   Period Months   Status On-going   Target Date 09/17/17     PT LONG TERM GOAL #3   Title Berg Balance Test >/= 45/56   Time 2   Period Months   Status On-going   Target Date 09/17/17     PT LONG TERM GOAL #4   Title Timed Up-Go cognitive with continuous naming tasks and increase <50% from standard TUG.    Time 2   Period Months   Status On-going   Target Date 09/17/17     PT LONG TERM GOAL #5   Title Patient ambulates 500' with LRAD modified independent for community mobility.   Time 2   Period Months   Status On-going   Target Date 09/17/17     Additional Long Term Goals   Additional Long Term Goals Yes     PT LONG TERM GOAL #6   Title Patient negotiates ramps, curbs & stairs with LRAD modified independent for community access.    Time 2   Period Months   Status On-going   Target Date 09/17/17     PT LONG TERM GOAL #7   Title Activities of Balance Confidence increases to >/= 45%   Time 2   Period Months   Status On-going   Target Date 09/17/17  PT LONG TERM GOAL #8   Title Pt will improve use  of VOR/gaze stability as indicated by ability to perform x 1 viewing in standing x 60 seconds and reports 0/5 dizziness with head turns, nods and pivoting   Time 2   Period Months   Status New   Target Date 09/17/17               Plan - 07/27/17 2152    Clinical Impression Statement Patient tested positive for orthostatic hypotension with dizziness upon standing. She verbalizes understanding of techniques to compensate for BP drop. She also appears to understand fall prevention strategies.    Rehab Potential Good   Clinical Impairments Affecting Rehab Potential HTN, depressive disorder, osteopenia, bil wrist fx's with falls left '08 & right '02, idiopathic thoracic scoliosis, LBP,    PT Frequency 2x / week   PT Duration Other (comment)  9 weeks (60 days)   PT Treatment/Interventions ADLs/Self Care Home Management;DME Instruction;Gait training;Moist Heat;Ultrasound;Canalith Repostioning;Stair training;Functional mobility training;Cryotherapy;Therapeutic activities;Therapeutic exercise;Balance training;Neuromuscular re-education;Patient/family education;Orthotic Fit/Training;Vestibular   PT Next Visit Plan check cervical HEP. Add standing exercises for strength & balance   Consulted and Agree with Plan of Care Patient      Patient will benefit from skilled therapeutic intervention in order to improve the following deficits and impairments:  Abnormal gait, Decreased activity tolerance, Decreased balance, Decreased endurance, Decreased knowledge of use of DME, Decreased mobility, Decreased range of motion, Decreased strength, Postural dysfunction, Pain, Impaired flexibility  Visit Diagnosis: Muscle weakness (generalized)  Unsteadiness on feet  Other abnormalities of gait and mobility  Abnormal posture  Dizziness and giddiness     Problem List Patient Active Problem List   Diagnosis Date Noted  . Symptomatic anemia 04/20/2016  . NSAID long-term use 04/20/2016  . Scoliosis  04/20/2016  . Depression 04/20/2016  . HTN (hypertension) 04/20/2016  . GERD (gastroesophageal reflux disease) 04/20/2016  . Occult GI bleeding 04/20/2016    Jamey Reas PT, DPT 07/27/2017, 9:57 PM  [facility] [address] [place], [place], [address] Phone: [phone]   Fax:  [phone]  Name: TOWANNA AVERY MRN: [id number] Date of Birth: Dec 07, 1945

## 2017-07-28 ENCOUNTER — Encounter: Payer: Self-pay | Admitting: Physical Therapy

## 2017-07-28 ENCOUNTER — Ambulatory Visit: Payer: Medicare Other | Admitting: Physical Therapy

## 2017-07-28 DIAGNOSIS — M6281 Muscle weakness (generalized): Secondary | ICD-10-CM

## 2017-07-28 DIAGNOSIS — R293 Abnormal posture: Secondary | ICD-10-CM

## 2017-07-28 DIAGNOSIS — R2681 Unsteadiness on feet: Secondary | ICD-10-CM

## 2017-07-28 DIAGNOSIS — R42 Dizziness and giddiness: Secondary | ICD-10-CM

## 2017-07-28 DIAGNOSIS — R2689 Other abnormalities of gait and mobility: Secondary | ICD-10-CM

## 2017-07-28 NOTE — Therapy (Signed)
[facility] [address] [place], [place], [address] Phone: [phone]   Fax:  [phone]  Physical Therapy Treatment  Patient Details  Name: Elaine Rivera MRN: [id number] Date of Birth: 1945-10-05 Referring Provider: Carol Ada, MD  Encounter Date: 07/28/2017      PT End of Session - 07/28/17 2322    Visit Number 4   Number of Visits 18   Date for PT Re-Evaluation 09/17/17   Authorization Type [employer] Medicare G-code & progress note every 10 visits   PT Start Time 1105   PT Stop Time 1145   PT Time Calculation (min) 40 min   Equipment Utilized During Treatment Gait belt   Activity Tolerance Patient tolerated treatment well   Behavior During Therapy WFL for tasks assessed/performed      Past Medical History:  Diagnosis Date  . Basal cell carcinoma of eye    biopsy of left eye/ non cancerous  . Complication of anesthesia    nausea/vomiting  . Depression   . Hypertension   . Kidney stones    twice/ [place]    Past Surgical History:  Procedure Laterality Date  . CEREBRAL ANEURYSM REPAIR     1998  . ECTOPIC PREGNANCY SURGERY     x2   . ESOPHAGOGASTRODUODENOSCOPY N/A 04/22/2016   Procedure: ESOPHAGOGASTRODUODENOSCOPY (EGD);  Surgeon: Juanita Craver, MD;  Location: [facility];  Service: Endoscopy;  Laterality: N/A;  . PARATHYROIDECTOMY     2002  . TONSILLECTOMY     1953  . VAGINAL HYSTERECTOMY     2001  . VOCAL CORD LATERALIZATION, ENDOSCOPIC APPROACH W/ MLB     2007 (@[facility] )  . WRIST SURGERY     left side/2008  . WRIST SURGERY     right side/ 2002    There were no vitals filed for this visit.      Subjective Assessment - 07/28/17 1108    Subjective Patient reports doing neck & eye exercises last night without issues.    Pertinent History HTN, depressive disorder, osteopenia, bil wrist fx's with falls left '08 & right '02, idiopathic thoracic scoliosis, LBP; has been a while since she  has had a bone density assessment   Limitations Lifting;Walking;Standing;House hold activities   Patient Stated Goals Walk & stand without significant pain.    Currently in Pain? No/denies                         [facility] Adult PT Treatment/Exercise - 07/28/17 1105      High Level Balance   High Level Balance Activities Side stepping;Direction changes  turning 90* right & left with pelvis leading & wt shift   High Level Balance Comments verbal & tactile cues on technique with UE support on parallel bar     Neuro Re-ed    Neuro Re-ed Details  Standing with mirror for visual feedback with LUE support to facilitate head over trunk midline and tactile cues for core stability:  alternate LE kicks anteriorly, posteriorly, laterally (abduction).  Seated on 24" stool: forward flexion with back extensors to sit upright, scapular retraction with bilateral horizontal abduction and sit to/from stand without UEs.              Balance Exercises - 07/28/17 1105      Balance Exercises: Standing   Standing Eyes Opened Wide (BOA);Foam/compliant surface;5 reps  head turns, tactile cues for trunk stability   Stepping Strategy Anterior;Posterior;Foam/compliant surface;UE support;10  reps  foam beam with LUE support             PT Short Term Goals - 07/21/17 1639      PT SHORT TERM GOAL #1   Title Patient is independent with inital HEP. (All STGs Target Date: 08/18/17)   Time 1   Period Months   Status On-going   Target Date 08/18/17     PT SHORT TERM GOAL #2   Title Patient is able to ambulate around furniture & carry conversation without significant balance losses requiring physical assist or touch on furniture.    Time 1   Period Months   Status On-going   Target Date 08/18/17     PT SHORT TERM GOAL #3   Title Standard Timed Up & Go <13.5sec   Time 1   Period Months   Status On-going   Target Date 08/18/17     PT SHORT TERM GOAL #4   Title Patient verbalizes  understanding of fall prevention strategies including use of assistive device to decrease back pain.    Time 1   Period Months   Status On-going   Target Date 08/18/17     PT SHORT TERM GOAL #5   Title Pt will demonstrate improved neck rotation ROM (to L) to allow for improved use of VOR as demonstrated by ability to perform x 1 viewing in standing with feet apart x 30 seconds each with supervision   Time 1   Period Months   Status New   Target Date 08/18/17           PT Long Term Goals - 07/21/17 1642      PT LONG TERM GOAL #1   Title Patient verbalizes & demonstrates understanding of ongoing fitness plan. (All LTGs Target Date: 09/17/17)   Time 2   Period Months   Status On-going   Target Date 09/17/17     PT LONG TERM GOAL #2   Title Patient reports pain increases </= 2 increments on 0-10 scale with standing & gait activities.    Time 2   Period Months   Status On-going   Target Date 09/17/17     PT LONG TERM GOAL #3   Title Berg Balance Test >/= 45/56   Time 2   Period Months   Status On-going   Target Date 09/17/17     PT LONG TERM GOAL #4   Title Timed Up-Go cognitive with continuous naming tasks and increase <50% from standard TUG.    Time 2   Period Months   Status On-going   Target Date 09/17/17     PT LONG TERM GOAL #5   Title Patient ambulates 500' with LRAD modified independent for community mobility.   Time 2   Period Months   Status On-going   Target Date 09/17/17     Additional Long Term Goals   Additional Long Term Goals Yes     PT LONG TERM GOAL #6   Title Patient negotiates ramps, curbs & stairs with LRAD modified independent for community access.    Time 2   Period Months   Status On-going   Target Date 09/17/17     PT LONG TERM GOAL #7   Title Activities of Balance Confidence increases to >/= 45%   Time 2   Period Months   Status On-going   Target Date 09/17/17     PT LONG TERM GOAL #8   Title Pt will improve use of VOR/gaze  stability as indicated by ability to perform x 1 viewing in standing x 60 seconds and reports 0/5 dizziness with head turns, nods and pivoting   Time 2   Period Months   Status New   Target Date 09/17/17               Plan - 07/28/17 2322    Clinical Impression Statement Patient requires tactile cues to facilitate posture with standing activities. Using mirror for visual feedback also improves standing posture. Activities facilitated ankle, hip & step strategies requiring UE assist and tactile cues.    Rehab Potential Good   Clinical Impairments Affecting Rehab Potential HTN, depressive disorder, osteopenia, bil wrist fx's with falls left '08 & right '02, idiopathic thoracic scoliosis, LBP,    PT Frequency 2x / week   PT Duration Other (comment)  9 weeks (60 days)   PT Treatment/Interventions ADLs/Self Care Home Management;DME Instruction;Gait training;Moist Heat;Ultrasound;Canalith Repostioning;Stair training;Functional mobility training;Cryotherapy;Therapeutic activities;Therapeutic exercise;Balance training;Neuromuscular re-education;Patient/family education;Orthotic Fit/Training;Vestibular   PT Next Visit Plan check cervical HEP. Neuromuscular standing exercises for strength & balance   Consulted and Agree with Plan of Care Patient      Patient will benefit from skilled therapeutic intervention in order to improve the following deficits and impairments:  Abnormal gait, Decreased activity tolerance, Decreased balance, Decreased endurance, Decreased knowledge of use of DME, Decreased mobility, Decreased range of motion, Decreased strength, Postural dysfunction, Pain, Impaired flexibility  Visit Diagnosis: Muscle weakness (generalized)  Unsteadiness on feet  Other abnormalities of gait and mobility  Abnormal posture  Dizziness and giddiness     Problem List Patient Active Problem List   Diagnosis Date Noted  . Symptomatic anemia 04/20/2016  . NSAID long-term use  04/20/2016  . Scoliosis 04/20/2016  . Depression 04/20/2016  . HTN (hypertension) 04/20/2016  . GERD (gastroesophageal reflux disease) 04/20/2016  . Occult GI bleeding 04/20/2016    Jamey Reas PT, DPT 07/28/2017, 11:29 PM  [facility] [address] [place], [place], [address] Phone: [phone]   Fax:  [phone]  Name: ALESSIA GONSALEZ MRN: [id number] Date of Birth: 09/11/1946

## 2017-07-29 ENCOUNTER — Ambulatory Visit: Payer: Medicare Other | Admitting: Physical Therapy

## 2017-08-02 ENCOUNTER — Ambulatory Visit: Payer: Medicare Other | Attending: Family Medicine | Admitting: Physical Therapy

## 2017-08-02 ENCOUNTER — Encounter: Payer: Self-pay | Admitting: Physical Therapy

## 2017-08-02 DIAGNOSIS — R293 Abnormal posture: Secondary | ICD-10-CM | POA: Diagnosis present

## 2017-08-02 DIAGNOSIS — R2689 Other abnormalities of gait and mobility: Secondary | ICD-10-CM | POA: Diagnosis present

## 2017-08-02 DIAGNOSIS — R2681 Unsteadiness on feet: Secondary | ICD-10-CM | POA: Insufficient documentation

## 2017-08-02 DIAGNOSIS — M6281 Muscle weakness (generalized): Secondary | ICD-10-CM | POA: Diagnosis not present

## 2017-08-02 NOTE — Therapy (Signed)
[facility] [address] [place], [place], [address] Phone: [phone]   Fax:  [phone]  Physical Therapy Treatment  Patient Details  Name: Elaine Rivera MRN: [id number] Date of Birth: [birth year] Referring Provider: Carol Ada, MD   Encounter Date: 08/02/2017  PT End of Session - 08/02/17 2300    Visit Number  5    Number of Visits  18    Date for PT Re-Evaluation  09/17/17    Authorization Type  [employer] Medicare G-code & progress note every 10 visits    PT Start Time  1400    PT Stop Time  1445    PT Time Calculation (min)  45 min    Equipment Utilized During Treatment  Gait belt    Activity Tolerance  Patient tolerated treatment well    Behavior During Therapy  [facility] for tasks assessed/performed       Past Medical History:  Diagnosis Date  . Basal cell carcinoma of eye    biopsy of left eye/ non cancerous  . Complication of anesthesia    nausea/vomiting  . Depression   . Hypertension   . Kidney stones    twice/ [place]    Past Surgical History:  Procedure Laterality Date  . CEREBRAL ANEURYSM REPAIR     1998  . ECTOPIC PREGNANCY SURGERY     x2   . PARATHYROIDECTOMY     2002  . TONSILLECTOMY     1953  . VAGINAL HYSTERECTOMY     2001  . VOCAL CORD LATERALIZATION, ENDOSCOPIC APPROACH W/ MLB     2007 (@[facility] )  . WRIST SURGERY     left side/2008  . WRIST SURGERY     right side/ 2002    There were no vitals filed for this visit.  Subjective Assessment - 08/02/17 1406    Subjective  No falls. She is picking up her feet better.     Pertinent History  HTN, depressive disorder, osteopenia, bil wrist fx's with falls left '08 & right '02, idiopathic thoracic scoliosis, LBP; has been a while since she has had a bone density assessment    Limitations  Lifting;Walking;Standing;House hold activities    Patient Stated Goals  Walk & stand without significant pain.     Currently in Pain?  No/denies                       [facility] Adult PT Treatment/Exercise - 08/02/17 1445      Transfers   Transfers  Sit to Stand;Stand to Sit    Sit to Stand  5: Supervision;Without upper extremity assist;From elevated surface from 24" stool without UE assist; 10 reps   from 24" stool without UE assist; 10 reps     Ambulation/Gait   Ambulation/Gait  Yes    Ambulation/Gait Assistance  5: Supervision;4: Min assist minA with scanning   minA with scanning   Ambulation/Gait Assistance Details  scanning right /left & up/down with tactile, verbal & visual cues     Ambulation Distance (Feet)  300 Feet    Assistive device  None    Ambulation Surface  Indoor;Level    Stairs  Yes    Stairs Assistance  5: Supervision    Stairs Assistance Details (indicate cue type and reason)  verbal cues on wt shift    Stair Management Technique  Two rails;Alternating pattern;Forwards    Number of Stairs  4 3 reps   3 reps  Ramp  4: Min assist no device   no device   Ramp Details (indicate cue type and reason)  verbal cues on posture & wt shift    Curb  4: Min assist no device   no device   Curb Details (indicate cue type and reason)  step thru for balance & wt shift      High Level Balance   High Level Balance Activities  Side stepping;Direction changes;Backward walking;Head turns;Other (comment) turning 90* right & left with pelvis leading & wt shift   turning 90* right & left with pelvis leading & wt shift   High Level Balance Comments  verbal & tactile cues on technique with UE support on parallel bar      Neuro Re-ed    Neuro Re-ed Details   Standing with mirror for visual feedback with LUE support to facilitate head over trunk midline and tactile cues for core stability:  alternate LE kicks anteriorly, posteriorly, laterally (abduction).  Seated on 24" stool: forward flexion with back extensors to sit upright, scapular retraction with bilateral horizontal abduction and sit to/from stand without UEs.            Balance Exercises - 08/02/17 1445      Balance Exercises: Standing   Standing Eyes Opened  Wide (BOA);Foam/compliant surface;5 reps head turns,    head turns,    Standing Eyes Closed  Wide (BOA);Foam/compliant surface;10 secs;3 reps static on foam beam   static on foam beam   Stepping Strategy  Anterior;Posterior;Foam/compliant surface;UE support;10 reps foam beam with LUE support   foam beam with LUE support   Heel Raises Limitations  10 reps UE support    Toe Raise Limitations  10 reps UE support          PT Short Term Goals - 07/21/17 1639      PT SHORT TERM GOAL #1   Title  Patient is independent with inital HEP. (All STGs Target Date: 08/18/17)    Time  1    Period  Months    Status  On-going    Target Date  08/18/17      PT SHORT TERM GOAL #2   Title  Patient is able to ambulate around furniture & carry conversation without significant balance losses requiring physical assist or touch on furniture.     Time  1    Period  Months    Status  On-going    Target Date  08/18/17      PT SHORT TERM GOAL #3   Title  Standard Timed Up & Go <13.5sec    Time  1    Period  Months    Status  On-going    Target Date  08/18/17      PT SHORT TERM GOAL #4   Title  Patient verbalizes understanding of fall prevention strategies including use of assistive device to decrease back pain.     Time  1    Period  Months    Status  On-going    Target Date  08/18/17      PT SHORT TERM GOAL #5   Title  Pt will demonstrate improved neck rotation ROM (to L) to allow for improved use of VOR as demonstrated by ability to perform x 1 viewing in standing with feet apart x 30 seconds each with supervision    Time  1    Period  Months    Status  New    Target Date  08/18/17  PT Long Term Goals - 07/21/17 1642      PT LONG TERM GOAL #1   Title  Patient verbalizes & demonstrates understanding of ongoing fitness plan. (All LTGs Target Date: 09/17/17)    Time  2     Period  Months    Status  On-going    Target Date  09/17/17      PT LONG TERM GOAL #2   Title  Patient reports pain increases </= 2 increments on 0-10 scale with standing & gait activities.     Time  2    Period  Months    Status  On-going    Target Date  09/17/17      PT LONG TERM GOAL #3   Title  Berg Balance Test >/= 45/56    Time  2    Period  Months    Status  On-going    Target Date  09/17/17      PT LONG TERM GOAL #4   Title  Timed Up-Go cognitive with continuous naming tasks and increase <50% from standard TUG.     Time  2    Period  Months    Status  On-going    Target Date  09/17/17      PT LONG TERM GOAL #5   Title  Patient ambulates 500' with LRAD modified independent for community mobility.    Time  2    Period  Months    Status  On-going    Target Date  09/17/17      Additional Long Term Goals   Additional Long Term Goals  Yes      PT LONG TERM GOAL #6   Title  Patient negotiates ramps, curbs & stairs with LRAD modified independent for community access.     Time  2    Period  Months    Status  On-going    Target Date  09/17/17      PT LONG TERM GOAL #7   Title  Activities of Balance Confidence increases to >/= 45%    Time  2    Period  Months    Status  On-going    Target Date  09/17/17      PT LONG TERM GOAL #8   Title  Pt will improve use of VOR/gaze stability as indicated by ability to perform x 1 viewing in standing x 60 seconds and reports 0/5 dizziness with head turns, nods and pivoting    Time  2    Period  Months    Status  New    Target Date  09/17/17            Plan - 08/02/17 2301    Clinical Impression Statement  Patient's balance reactions are improving with skilled intruction & progressive activities. Patient is on target to meet STGs by 11/21.     Rehab Potential  Good    Clinical Impairments Affecting Rehab Potential  HTN, depressive disorder, osteopenia, bil wrist fx's with falls left '08 & right '02, idiopathic thoracic  scoliosis, LBP,     PT Frequency  2x / week    PT Duration  Other (comment) 9 weeks (60 days)   9 weeks (60 days)   PT Treatment/Interventions  ADLs/Self Care Home Management;DME Instruction;Gait training;Moist Heat;Ultrasound;Canalith Repostioning;Stair training;Functional mobility training;Cryotherapy;Therapeutic activities;Therapeutic exercise;Balance training;Neuromuscular re-education;Patient/family education;Orthotic Fit/Training;Vestibular    PT Next Visit Plan  Neuromuscular standing exercises for strength & balance; gait including scanning & negotiating obstacles  Consulted and Agree with Plan of Care  Patient       Patient will benefit from skilled therapeutic intervention in order to improve the following deficits and impairments:  Abnormal gait, Decreased activity tolerance, Decreased balance, Decreased endurance, Decreased knowledge of use of DME, Decreased mobility, Decreased range of motion, Decreased strength, Postural dysfunction, Pain, Impaired flexibility  Visit Diagnosis: Muscle weakness (generalized)  Unsteadiness on feet  Other abnormalities of gait and mobility  Abnormal posture     Problem List Patient Active Problem List   Diagnosis Date Noted  . Symptomatic anemia 04/20/2016  . NSAID long-term use 04/20/2016  . Scoliosis 04/20/2016  . Depression 04/20/2016  . HTN (hypertension) 04/20/2016  . GERD (gastroesophageal reflux disease) 04/20/2016  . Occult GI bleeding 04/20/2016    Yessica Putnam PT, DPT 08/02/2017, 11:09 PM  [facility] [address] [place], [place], [address] Phone: [phone]   Fax:  [phone]  Name: Elaine Rivera MRN: [id number] Date of Birth: 04/26/1946

## 2017-08-06 ENCOUNTER — Ambulatory Visit: Payer: Medicare Other | Admitting: Physical Therapy

## 2017-08-09 ENCOUNTER — Ambulatory Visit: Payer: Medicare Other | Admitting: Physical Therapy

## 2017-08-09 DIAGNOSIS — M6281 Muscle weakness (generalized): Secondary | ICD-10-CM | POA: Diagnosis not present

## 2017-08-09 DIAGNOSIS — R293 Abnormal posture: Secondary | ICD-10-CM

## 2017-08-09 DIAGNOSIS — R2689 Other abnormalities of gait and mobility: Secondary | ICD-10-CM

## 2017-08-09 DIAGNOSIS — R2681 Unsteadiness on feet: Secondary | ICD-10-CM

## 2017-08-10 NOTE — Therapy (Signed)
[facility] [address] [place], [place], [address] Phone: [phone]   Fax:  [phone]  Physical Therapy Treatment  Patient Details  Name: Elaine Rivera MRN: [id number] Date of Birth: 30-Mar-1946 Referring Provider: Carol Ada, MD   Encounter Date: 08/09/2017  PT End of Session - 08/09/17 1404    Visit Number  6    Number of Visits  18    Date for PT Re-Evaluation  09/17/17    Authorization Type  [employer] Medicare G-code & progress note every 10 visits    PT Start Time  1400    PT Stop Time  1445    PT Time Calculation (min)  45 min    Equipment Utilized During Treatment  Gait belt    Activity Tolerance  Patient tolerated treatment well    Behavior During Therapy  [facility] for tasks assessed/performed       Past Medical History:  Diagnosis Date  . Basal cell carcinoma of eye    biopsy of left eye/ non cancerous  . Complication of anesthesia    nausea/vomiting  . Depression   . Hypertension   . Kidney stones    twice/ [place]    Past Surgical History:  Procedure Laterality Date  . CEREBRAL ANEURYSM REPAIR     1998  . ECTOPIC PREGNANCY SURGERY     x2   . PARATHYROIDECTOMY     2002  . TONSILLECTOMY     1953  . VAGINAL HYSTERECTOMY     2001  . VOCAL CORD LATERALIZATION, ENDOSCOPIC APPROACH W/ MLB     2007 (@[facility] )  . WRIST SURGERY     left side/2008  . WRIST SURGERY     right side/ 2002    There were no vitals filed for this visit.  Subjective Assessment - 08/09/17 1404    Subjective  Patient reports no falls. Pt has lower back pain and believes the weather probably is making it worse than usual.    Pertinent History  HTN, depressive disorder, osteopenia, bil wrist fx's with falls left '08 & right '02, idiopathic thoracic scoliosis, LBP; has been a while since she has had a bone density assessment    Limitations  Lifting;Walking;Standing;House hold activities    Patient Stated Goals  Walk &  stand without significant pain.     Currently in Pain?  Yes    Pain Score  6     Pain Location  Back    Pain Orientation  Lower    Pain Descriptors / Indicators  Aching    Pain Type  Chronic pain    Pain Onset  More than a month ago    Pain Frequency  Constant    Multiple Pain Sites  No             08/09/17 0001  Transfers  Transfers Sit to Stand;Stand to Sit  Sit to Stand 5: Supervision;Without upper extremity assist;From elevated surface (10 reps)  Ambulation/Gait  Stairs Yes  Stairs Assistance 4: Min guard  Stair Management Technique Alternating pattern;Forwards;One rail Right;One rail Left  Number of Stairs 4 (3 sets)  Posture/Postural Control  Posture/Postural Control Postural limitations  Postural Limitations Rounded Shoulders;Forward head;Increased thoracic kyphosis;Flexed trunk;Weight shift right (scoliosis S-curve with occipital 1" right of sacrum )  Posture Comments severe scoliosis that patient reports has increased with age  Neuro Re-ed   Neuro Re-ed Details  Standing in parallel bars with mirror for visual feedback on blue foam  beam without UE support to elicit balance and coordination. Pt performed head turns and weight shift (step off/step back) with RLE then LLE, forward then backwards. Pt performed stepping strategy activities forward and backwards to decrease risk of falling. Verbal and tactile cues given with Min A.   Exercises  Exercises Other Exercises  Other Exercises  Educated and instructed pt in PWR! Exercises in sitting. Pt will benefit from further instruction. Refer to pt instructions for details.                 PWR [facility]) - 08/10/17 0835    PWR! Up  X10, 1 set    PWR! Rock  x10, 1 set    PWR! Twist  x10, 1 set    PWR! Step  x10, 1 set    Comments  Pt performed exercises with multimodal cues, and would benefit from further instruction.          PT Education - 08/10/17 0846    Education provided  Yes    Education Details   Educated and instructed in [place]! exercises.     Person(s) Educated  Patient    Methods  Explanation;Demonstration;Handout;Verbal cues    Comprehension  Verbalized understanding;Returned demonstration;Need further instruction;Verbal cues required       PT Short Term Goals - 07/21/17 1639      PT SHORT TERM GOAL #1   Title  Patient is independent with inital HEP. (All STGs Target Date: 08/18/17)    Time  1    Period  Months    Status  On-going    Target Date  08/18/17      PT SHORT TERM GOAL #2   Title  Patient is able to ambulate around furniture & carry conversation without significant balance losses requiring physical assist or touch on furniture.     Time  1    Period  Months    Status  On-going    Target Date  08/18/17      PT SHORT TERM GOAL #3   Title  Standard Timed Up & Go <13.5sec    Time  1    Period  Months    Status  On-going    Target Date  08/18/17      PT SHORT TERM GOAL #4   Title  Patient verbalizes understanding of fall prevention strategies including use of assistive device to decrease back pain.     Time  1    Period  Months    Status  On-going    Target Date  08/18/17      PT SHORT TERM GOAL #5   Title  Pt will demonstrate improved neck rotation ROM (to L) to allow for improved use of VOR as demonstrated by ability to perform x 1 viewing in standing with feet apart x 30 seconds each with supervision    Time  1    Period  Months    Status  New    Target Date  08/18/17        PT Long Term Goals - 07/21/17 1642      PT LONG TERM GOAL #1   Title  Patient verbalizes & demonstrates understanding of ongoing fitness plan. (All LTGs Target Date: 09/17/17)    Time  2    Period  Months    Status  On-going    Target Date  09/17/17      PT LONG TERM GOAL #2   Title  Patient reports pain increases </=  2 increments on 0-10 scale with standing & gait activities.     Time  2    Period  Months    Status  On-going    Target Date  09/17/17      PT LONG  TERM GOAL #3   Title  Berg Balance Test >/= 45/56    Time  2    Period  Months    Status  On-going    Target Date  09/17/17      PT LONG TERM GOAL #4   Title  Timed Up-Go cognitive with continuous naming tasks and increase <50% from standard TUG.     Time  2    Period  Months    Status  On-going    Target Date  09/17/17      PT LONG TERM GOAL #5   Title  Patient ambulates 500' with LRAD modified independent for community mobility.    Time  2    Period  Months    Status  On-going    Target Date  09/17/17      Additional Long Term Goals   Additional Long Term Goals  Yes      PT LONG TERM GOAL #6   Title  Patient negotiates ramps, curbs & stairs with LRAD modified independent for community access.     Time  2    Period  Months    Status  On-going    Target Date  09/17/17      PT LONG TERM GOAL #7   Title  Activities of Balance Confidence increases to >/= 45%    Time  2    Period  Months    Status  On-going    Target Date  09/17/17      PT LONG TERM GOAL #8   Title  Pt will improve use of VOR/gaze stability as indicated by ability to perform x 1 viewing in standing x 60 seconds and reports 0/5 dizziness with head turns, nods and pivoting    Time  2    Period  Months    Status  New    Target Date  09/17/17            Plan - 08/09/17 1453    Clinical Impression Statement  Pt presented with hand tremors today. Educated and performed sitting PWR exercises, performed balance activities and stair training. Pt will benefit from continued PT for ankle, hip and knee strengthening, and continued training with balance, gait and stairs.    Clinical Impairments Affecting Rehab Potential  HTN, depressive disorder, osteopenia, bil wrist fx's with falls left '08 & right '02, idiopathic thoracic scoliosis, LBP,     PT Frequency  2x / week    PT Duration  Other (comment)    PT Treatment/Interventions  ADLs/Self Care Home Management;DME Instruction;Gait training;Moist  Heat;Ultrasound;Canalith Repostioning;Stair training;Functional mobility training;Cryotherapy;Therapeutic activities;Therapeutic exercise;Balance training;Neuromuscular re-education;Patient/family education;Orthotic Fit/Training;Vestibular    PT Next Visit Plan  Neuromuscular standing exercises for strength & balance; stairs; gait including scanning & negotiating obstacles    Consulted and Agree with Plan of Care  Patient       Patient will benefit from skilled therapeutic intervention in order to improve the following deficits and impairments:  Abnormal gait, Decreased activity tolerance, Decreased balance, Decreased endurance, Decreased knowledge of use of DME, Decreased mobility, Decreased range of motion, Decreased strength, Postural dysfunction, Pain, Impaired flexibility  Visit Diagnosis: Muscle weakness (generalized)  Unsteadiness on feet  Other abnormalities of gait and  mobility  Abnormal posture     Problem List Patient Active Problem List   Diagnosis Date Noted  . Symptomatic anemia 04/20/2016  . NSAID long-term use 04/20/2016  . Scoliosis 04/20/2016  . Depression 04/20/2016  . HTN (hypertension) 04/20/2016  . GERD (gastroesophageal reflux disease) 04/20/2016  . Occult GI bleeding 04/20/2016    Andria Meuse, SPTA 08/10/2017, 9:06 AM  Jamey Reas, PT, DPT PT Specializing in Prosthetics & Orthotics 08/10/17 10:31 AM Phone:  [phone]  Fax:  [phone] [facility] [address] [place], [place] [address]   [facility] [address] [place], [place], [address] Phone: [phone]   Fax:  [phone]  Name: SHANYAH GATTUSO MRN: [id number] Date of Birth: 07-03-1946

## 2017-08-12 ENCOUNTER — Ambulatory Visit: Payer: Medicare Other | Admitting: Physical Therapy

## 2017-08-16 ENCOUNTER — Ambulatory Visit: Payer: Medicare Other | Admitting: Physical Therapy

## 2017-08-16 ENCOUNTER — Encounter: Payer: Self-pay | Admitting: Physical Therapy

## 2017-08-16 DIAGNOSIS — M6281 Muscle weakness (generalized): Secondary | ICD-10-CM

## 2017-08-16 DIAGNOSIS — R2689 Other abnormalities of gait and mobility: Secondary | ICD-10-CM

## 2017-08-16 DIAGNOSIS — R2681 Unsteadiness on feet: Secondary | ICD-10-CM

## 2017-08-16 DIAGNOSIS — R293 Abnormal posture: Secondary | ICD-10-CM

## 2017-08-16 NOTE — Therapy (Signed)
[facility] [address] [place], [place], [address] Phone: [phone]   Fax:  [phone]  Physical Therapy Treatment  Patient Details  Name: Elaine Rivera MRN: [id number] Date of Birth: 06-27-1946 Referring Provider: Carol Ada, MD   Encounter Date: 08/16/2017  PT End of Session - 08/16/17 2245    Visit Number  7    Number of Visits  18    Date for PT Re-Evaluation  09/17/17    Authorization Type  [employer] Medicare G-code & progress note every 10 visits    PT Start Time  1406    PT Stop Time  1445    PT Time Calculation (min)  39 min    Equipment Utilized During Treatment  Gait belt    Activity Tolerance  Patient tolerated treatment well    Behavior During Therapy  WFL for tasks assessed/performed       Past Medical History:  Diagnosis Date  . Basal cell carcinoma of eye    biopsy of left eye/ non cancerous  . Complication of anesthesia    nausea/vomiting  . Depression   . Hypertension   . Kidney stones    twice/ [place]    Past Surgical History:  Procedure Laterality Date  . CEREBRAL ANEURYSM REPAIR     1998  . ECTOPIC PREGNANCY SURGERY     x2   . ESOPHAGOGASTRODUODENOSCOPY (EGD) N/A 04/22/2016   Performed by Juanita Craver, MD at [facility]  . PARATHYROIDECTOMY     2002  . TONSILLECTOMY     1953  . VAGINAL HYSTERECTOMY     2001  . VOCAL CORD LATERALIZATION, ENDOSCOPIC APPROACH W/ MLB     2007 (_0 )  . WRIST SURGERY     left side/2008  . WRIST SURGERY     right side/ 2002    There were no vitals filed for this visit.  Subjective Assessment - 08/16/17 1407    Subjective  No falls. Patient reports doing exercises.     Pertinent History  HTN, depressive disorder, osteopenia, bil wrist fx's with falls left '08 & right '02, idiopathic thoracic scoliosis, LBP; has been a while since she has had a bone density assessment    Limitations  Lifting;Walking;Standing;House hold activities     Patient Stated Goals  Walk & stand without significant pain.     Currently in Pain?  No/denies                      [facility] Adult PT Treatment/Exercise - 08/16/17 1406      Transfers   Transfers  Sit to Stand;Stand to Sit    Sit to Stand  5: Supervision;With upper extremity assist;From chair/3-in-1 chair without armrests      Ambulation/Gait   Ambulation/Gait  Yes    Ambulation/Gait Assistance  5: Supervision    Ambulation/Gait Assistance Details  negotiating around furniture without balance loss    Stairs  Yes    Stairs Assistance  5: Supervision    Stair Management Technique  Alternating pattern;Forwards;One rail Right;One rail Left    Number of Stairs  4 3 sets    Ramp  5: Supervision      Posture/Postural Control   Posture/Postural Control  --    Postural Limitations  --    Posture Comments  --      Neuro Re-ed    Neuro Re-ed Details   --      Exercises   Exercises  --  Other Exercises   --        PWR [facility]) - 08/16/17 1406    PWR! Up  x 10 reps with PT demo, verbal & tactile cues on technique    PWR! Rock  x 10 reps with PT demo, verbal & tactile cues on technique    PWR! Twist  x 10 reps with PT demo, verbal & tactile cues on technique    PWR! Step  x 10 reps with PT demo, verbal & tactile cues on technique       Balance Exercises - 08/16/17 1406      Balance Exercises: Standing   Standing Eyes Opened  Wide (BOA);Foam/compliant surface;5 reps head turns,     Standing Eyes Closed  Wide (BOA);Foam/compliant surface;10 secs;3 reps static on foam beam    Stepping Strategy  Anterior;Posterior;Foam/compliant surface;UE support;10 reps foam beam with LUE support    Rockerboard  Anterior/posterior;Lateral;EO;Head turns;5 reps lateral & ant/post rock with recovery with LUE hand hold ass    Heel Raises Limitations  10 reps UE support    Toe Raise Limitations  10 reps UE support    Other Standing Exercises  standing on ramp uphill & downhill with cane  support & tactile cues: alternate stepping.           PT Short Term Goals - 08/16/17 2246      PT SHORT TERM GOAL #1   Title  Patient is independent with inital HEP. (All STGs Target Date: 08/18/17)    Baseline  MET 08/16/2017    Time  1    Period  Months    Status  Achieved      PT SHORT TERM GOAL #2   Title  Patient is able to ambulate around furniture & carry conversation without significant balance losses requiring physical assist or touch on furniture.     Baseline  MET 08/16/2017    Time  1    Period  Months    Status  Achieved      PT SHORT TERM GOAL #3   Title  Standard Timed Up & Go <13.5sec    Time  1    Period  Months    Status  On-going      PT SHORT TERM GOAL #4   Title  Patient verbalizes understanding of fall prevention strategies including use of assistive device to decrease back pain.     Baseline  MET 08/16/2017    Time  1    Period  Months    Status  Achieved      PT SHORT TERM GOAL #5   Title  Pt will demonstrate improved neck rotation ROM (to L) to allow for improved use of VOR as demonstrated by ability to perform x 1 viewing in standing with feet apart x 30 seconds each with supervision    Time  1    Period  Months    Status  On-going        PT Long Term Goals - 07/21/17 1642      PT LONG TERM GOAL #1   Title  Patient verbalizes & demonstrates understanding of ongoing fitness plan. (All LTGs Target Date: 09/17/17)    Time  2    Period  Months    Status  On-going    Target Date  09/17/17      PT LONG TERM GOAL #2   Title  Patient reports pain increases </= 2 increments on 0-10 scale with standing & gait  activities.     Time  2    Period  Months    Status  On-going    Target Date  09/17/17      PT LONG TERM GOAL #3   Title  Berg Balance Test >/= 45/56    Time  2    Period  Months    Status  On-going    Target Date  09/17/17      PT LONG TERM GOAL #4   Title  Timed Up-Go cognitive with continuous naming tasks and increase <50%  from standard TUG.     Time  2    Period  Months    Status  On-going    Target Date  09/17/17      PT LONG TERM GOAL #5   Title  Patient ambulates 500' with LRAD modified independent for community mobility.    Time  2    Period  Months    Status  On-going    Target Date  09/17/17      Additional Long Term Goals   Additional Long Term Goals  Yes      PT LONG TERM GOAL #6   Title  Patient negotiates ramps, curbs & stairs with LRAD modified independent for community access.     Time  2    Period  Months    Status  On-going    Target Date  09/17/17      PT LONG TERM GOAL #7   Title  Activities of Balance Confidence increases to >/= 45%    Time  2    Period  Months    Status  On-going    Target Date  09/17/17      PT LONG TERM GOAL #8   Title  Pt will improve use of VOR/gaze stability as indicated by ability to perform x 1 viewing in standing x 60 seconds and reports 0/5 dizziness with head turns, nods and pivoting    Time  2    Period  Months    Status  New    Target Date  09/17/17            Plan - 08/16/17 2247    Clinical Impression Statement  Patient met 3 of 5 STGs checked today. Patient improved balance reactions with ankle, hip & step strategies.     Clinical Impairments Affecting Rehab Potential  HTN, depressive disorder, osteopenia, bil wrist fx's with falls left '08 & right '02, idiopathic thoracic scoliosis, LBP,     PT Frequency  2x / week    PT Duration  Other (comment)    PT Treatment/Interventions  ADLs/Self Care Home Management;DME Instruction;Gait training;Moist Heat;Ultrasound;Canalith Repostioning;Stair training;Functional mobility training;Cryotherapy;Therapeutic activities;Therapeutic exercise;Balance training;Neuromuscular re-education;Patient/family education;Orthotic Fit/Training;Vestibular    PT Next Visit Plan  check remaining 2 STGs, Neuromuscular standing exercises for strength & balance; stairs; gait including scanning & negotiating obstacles     Consulted and Agree with Plan of Care  Patient       Patient will benefit from skilled therapeutic intervention in order to improve the following deficits and impairments:  Abnormal gait, Decreased activity tolerance, Decreased balance, Decreased endurance, Decreased knowledge of use of DME, Decreased mobility, Decreased range of motion, Decreased strength, Postural dysfunction, Pain, Impaired flexibility  Visit Diagnosis: Muscle weakness (generalized)  Unsteadiness on feet  Other abnormalities of gait and mobility  Abnormal posture     Problem List Patient Active Problem List   Diagnosis Date Noted  . Symptomatic anemia 04/20/2016  .  NSAID long-term use 04/20/2016  . Scoliosis 04/20/2016  . Depression 04/20/2016  . HTN (hypertension) 04/20/2016  . GERD (gastroesophageal reflux disease) 04/20/2016  . Occult GI bleeding 04/20/2016    Jamey Reas PT, DPT 08/16/2017, 10:58 PM  [facility] [address] [place], [place], [address] Phone: [phone]   Fax:  [phone]  Name: Elaine Rivera MRN: [id number] Date of Birth: July 01, 1946

## 2017-08-18 ENCOUNTER — Encounter: Payer: Self-pay | Admitting: Physical Therapy

## 2017-08-18 ENCOUNTER — Ambulatory Visit: Payer: Medicare Other | Admitting: Physical Therapy

## 2017-08-18 DIAGNOSIS — R2689 Other abnormalities of gait and mobility: Secondary | ICD-10-CM

## 2017-08-18 DIAGNOSIS — R2681 Unsteadiness on feet: Secondary | ICD-10-CM

## 2017-08-18 DIAGNOSIS — M6281 Muscle weakness (generalized): Secondary | ICD-10-CM | POA: Diagnosis not present

## 2017-08-18 DIAGNOSIS — R293 Abnormal posture: Secondary | ICD-10-CM

## 2017-08-18 NOTE — Therapy (Signed)
[facility] [address] [place], [place], [address] Phone: [phone]   Fax:  [phone]  Physical Therapy Treatment  Patient Details  Name: Elaine Rivera MRN: [id number] Date of Birth: 1946/01/30 Referring Provider: Carol Ada, MD   Encounter Date: 08/18/2017  PT End of Session - 08/18/17 1406    Visit Number  8    Number of Visits  18    Date for PT Re-Evaluation  09/17/17    Authorization Type  [employer] Medicare G-code & progress note every 10 visits    PT Start Time  1402    PT Stop Time  1446    PT Time Calculation (min)  44 min    Equipment Utilized During Treatment  Gait belt    Activity Tolerance  Patient tolerated treatment well    Behavior During Therapy  WFL for tasks assessed/performed       Past Medical History:  Diagnosis Date  . Basal cell carcinoma of eye    biopsy of left eye/ non cancerous  . Complication of anesthesia    nausea/vomiting  . Depression   . Hypertension   . Kidney stones    twice/ [place]    Past Surgical History:  Procedure Laterality Date  . CEREBRAL ANEURYSM REPAIR     1998  . ECTOPIC PREGNANCY SURGERY     x2   . ESOPHAGOGASTRODUODENOSCOPY N/A 04/22/2016   Procedure: ESOPHAGOGASTRODUODENOSCOPY (EGD);  Surgeon: Juanita Craver, MD;  Location: [facility];  Service: Endoscopy;  Laterality: N/A;  . PARATHYROIDECTOMY     2002  . TONSILLECTOMY     1953  . VAGINAL HYSTERECTOMY     2001  . VOCAL CORD LATERALIZATION, ENDOSCOPIC APPROACH W/ MLB     2007 ('@[facility]' )  . WRIST SURGERY     left side/2008  . WRIST SURGERY     right side/ 2002    There were no vitals filed for this visit.  Subjective Assessment - 08/18/17 1409    Subjective  No falls but pt reports being off balance today.     Pertinent History  HTN, depressive disorder, osteopenia, bil wrist fx's with falls left '08 & right '02, idiopathic thoracic scoliosis, LBP; has been a while since she has had a  bone density assessment    Limitations  Lifting;Walking;Standing;House hold activities    Patient Stated Goals  Walk & stand without significant pain.     Currently in Pain?  Yes    Pain Score  5     Pain Location  Back    Pain Orientation  Lower    Pain Descriptors / Indicators  Aching    Pain Type  Chronic pain    Pain Onset  More than a month ago    Pain Frequency  Constant    Aggravating Factors   Standing 5-10 minutes & walking, getting up after being in bed or sitting for a while.     Pain Relieving Factors  Laying down & bringing knees to chest.     Effect of Pain on Daily Activities  Limits standing & walking.     Multiple Pain Sites  No         [facility] PT Assessment - 08/18/17 1411      AROM   Overall AROM   Deficits    Overall AROM Comments  Cervical rotation: Left 61* Right 69*     AROM Assessment Site  Cervical      Timed Up  and Go Test   TUG  Normal TUG    Normal TUG (seconds)  11.22    TUG Comments  Pt no longer in high fall risk categeory.        [facility] Adult PT Treatment/Exercise - 08/18/17 1620      Posture/Postural Control   Posture/Postural Control  Postural limitations    Postural Limitations  Rounded Shoulders;Forward head;Increased thoracic kyphosis;Flexed trunk;Weight shift right      Exercises   Exercises  Shoulder      Shoulder Exercises: Seated   Other Seated Exercises  Pt seated in chair in front of wall, pt instructed to tuck the chin, retract scapula and apply pressure posteriorly with head onto wall. Pt also instructed to do so when seated in the car or in a high back-chair to improve natural posture.         PWR [facility]) - 08/18/17 1612    PWR! Up  1 set x 10 reps with therapist demo, verbal & tactile cues on technique    PWR! Rock  1 set x 10 reps with therapist demo, verbal & tactile cues on technique    PWR! Twist  1 set x 10 reps with therapist demo, verbal & tactile cues on technique    PWR Step  1 set x 10 reps with therapist demo,  verbal & tactile cues on technique    Comments  Pt performed exercises with multimodal cues, and would benefit from further instruction        PT Short Term Goals - 08/18/17 1637      PT [place] #1   Title  Patient is independent with inital HEP. (All STGs Target Date: 08/18/17)    Baseline  MET 08/16/2017    Time  1    Period  Months    Status  Achieved    Target Date  08/18/17      PT SHORT TERM GOAL #2   Title  Patient is able to ambulate around furniture & carry conversation without significant balance losses requiring physical assist or touch on furniture.     Baseline  MET 08/16/2017    Time  1    Period  Months    Status  Achieved    Target Date  08/18/17      PT SHORT TERM GOAL #3   Title  Standard Timed Up & Go <13.5sec    Baseline  11/21: 11.22 sec    Time  1    Period  Months    Status  Achieved    Target Date  08/18/17      PT SHORT TERM GOAL #4   Title  Patient verbalizes understanding of fall prevention strategies including use of assistive device to decrease back pain.     Baseline  MET 08/16/2017    Time  1    Period  Months    Status  Achieved    Target Date  08/18/17      PT SHORT TERM GOAL #5   Title  Pt will demonstrate improved neck rotation ROM (to L) to allow for improved use of VOR as demonstrated by ability to perform x 1 viewing in standing with feet apart x 30 seconds each with supervision    Baseline  11/21: Cervical ROM: Left- 61*, Right 69*    Time  1    Period  Months    Status  Achieved    Target Date  08/18/17  PT Long Term Goals - 07/21/17 1642      PT LONG TERM GOAL #1   Title  Patient verbalizes & demonstrates understanding of ongoing fitness plan. (All LTGs Target Date: 09/17/17)    Time  2    Period  Months    Status  On-going    Target Date  09/17/17      PT LONG TERM GOAL #2   Title  Patient reports pain increases </= 2 increments on 0-10 scale with standing & gait activities.     Time  2    Period   Months    Status  On-going    Target Date  09/17/17      PT LONG TERM GOAL #3   Title  Berg Balance Test >/= 45/56    Time  2    Period  Months    Status  On-going    Target Date  09/17/17      PT LONG TERM GOAL #4   Title  Timed Up-Go cognitive with continuous naming tasks and increase <50% from standard TUG.     Time  2    Period  Months    Status  On-going    Target Date  09/17/17      PT LONG TERM GOAL #5   Title  Patient ambulates 500' with LRAD modified independent for community mobility.    Time  2    Period  Months    Status  On-going    Target Date  09/17/17      Additional Long Term Goals   Additional Long Term Goals  Yes      PT LONG TERM GOAL #6   Title  Patient negotiates ramps, curbs & stairs with LRAD modified independent for community access.     Time  2    Period  Months    Status  On-going    Target Date  09/17/17      PT LONG TERM GOAL #7   Title  Activities of Balance Confidence increases to >/= 45%    Time  2    Period  Months    Status  On-going    Target Date  09/17/17      PT LONG TERM GOAL #8   Title  Pt will improve use of VOR/gaze stability as indicated by ability to perform x 1 viewing in standing x 60 seconds and reports 0/5 dizziness with head turns, nods and pivoting    Time  2    Period  Months    Status  New    Target Date  09/17/17            Plan - 08/18/17 1642    Clinical Impression Statement  Pt tolerated treatment well with no limitations due to pain or fatigue. Todays session focused on performing PWR moves in standing and checking remaining STG's. Pt still requires reinforcement with PWR moves in standing prior to sending home but will progress to them for HEP.     Clinical Impairments Affecting Rehab Potential  HTN, depressive disorder, osteopenia, bil wrist fx's with falls left '08 & right '02, idiopathic thoracic scoliosis, LBP,     PT Frequency  2x / week    PT Duration  Other (comment)    PT  Treatment/Interventions  ADLs/Self Care Home Management;DME Instruction;Gait training;Moist Heat;Ultrasound;Canalith Repostioning;Stair training;Functional mobility training;Cryotherapy;Therapeutic activities;Therapeutic exercise;Balance training;Neuromuscular re-education;Patient/family education;Orthotic Fit/Training;Vestibular    PT Next Visit Plan  Continue with PWR moves in standing,  Neuromuscular standing exercises for strength & balance; stairs; gait including scanning & negotiating obstacles    Consulted and Agree with Plan of Care  Patient       Patient will benefit from skilled therapeutic intervention in order to improve the following deficits and impairments:  Abnormal gait, Decreased activity tolerance, Decreased balance, Decreased endurance, Decreased knowledge of use of DME, Decreased mobility, Decreased range of motion, Decreased strength, Postural dysfunction, Pain, Impaired flexibility  Visit Diagnosis: Muscle weakness (generalized)  Unsteadiness on feet  Other abnormalities of gait and mobility  Abnormal posture     Problem List Patient Active Problem List   Diagnosis Date Noted  . Symptomatic anemia 04/20/2016  . NSAID long-term use 04/20/2016  . Scoliosis 04/20/2016  . Depression 04/20/2016  . HTN (hypertension) 04/20/2016  . GERD (gastroesophageal reflux disease) 04/20/2016  . Occult GI bleeding 04/20/2016   Delmus Warwick, SPTA  Raye Wiens 08/18/2017, 4:52 PM  [facility] [address] [place], [place], [address] Phone: [phone]   Fax:  [phone]  Name: Elaine Rivera MRN: [id number] Date of Birth: 02-02-1946

## 2017-08-23 ENCOUNTER — Ambulatory Visit: Payer: Medicare Other | Admitting: Physical Therapy

## 2017-08-25 ENCOUNTER — Ambulatory Visit: Payer: Medicare Other

## 2017-08-25 DIAGNOSIS — M6281 Muscle weakness (generalized): Secondary | ICD-10-CM

## 2017-08-25 DIAGNOSIS — R2681 Unsteadiness on feet: Secondary | ICD-10-CM

## 2017-08-25 DIAGNOSIS — R293 Abnormal posture: Secondary | ICD-10-CM

## 2017-08-25 DIAGNOSIS — R2689 Other abnormalities of gait and mobility: Secondary | ICD-10-CM

## 2017-08-25 NOTE — Therapy (Signed)
[facility] [address] [place], [place], [address] Phone: [phone]   Fax:  [phone]  Physical Therapy Treatment  Patient Details  Name: Elaine Rivera MRN: [id number] Date of Birth: 08/11/1946 Referring Provider: Candace Smith, MD   Encounter Date: 08/25/2017  PT End of Session - 08/25/17 1552    Visit Number  9    Number of Visits  18    Date for PT Re-Evaluation  09/17/17    Authorization Type  [employer] Medicare G-code & progress note every 10 visits    PT Start Time  1401    PT Stop Time  1443    PT Time Calculation (min)  42 min    Equipment Utilized During Treatment  -- min A-S prn    Activity Tolerance  Patient tolerated treatment well    Behavior During Therapy  WFL for tasks assessed/performed       Past Medical History:  Diagnosis Date  . Basal cell carcinoma of eye    biopsy of left eye/ non cancerous  . Complication of anesthesia    nausea/vomiting  . Depression   . Hypertension   . Kidney stones    twice/ 1997 & 1998    Past Surgical History:  Procedure Laterality Date  . CEREBRAL ANEURYSM REPAIR     1998  . ECTOPIC PREGNANCY SURGERY     x2   . ESOPHAGOGASTRODUODENOSCOPY N/A 04/22/2016   Procedure: ESOPHAGOGASTRODUODENOSCOPY (EGD);  Surgeon: Jyothi Mann, MD;  Location: [facility];  Service: Endoscopy;  Laterality: N/A;  . PARATHYROIDECTOMY     2002  . TONSILLECTOMY     1953  . VAGINAL HYSTERECTOMY     2001  . VOCAL CORD LATERALIZATION, ENDOSCOPIC APPROACH W/ MLB     2007 (@[facility])  . WRIST SURGERY     left side/2008  . WRIST SURGERY     right side/ 2002    There were no vitals filed for this visit.  Subjective Assessment - 08/25/17 1403    Subjective  Pt reported one fall when she was attempting to step around an obstacle in the yard, she fell forward but denied injury.     Pertinent History  HTN, depressive disorder, osteopenia, bil wrist fx's with falls left '08 & right  '02, idiopathic thoracic scoliosis, LBP; has been a while since she has had a bone density assessment    Patient Stated Goals  Walk & stand without significant pain.     Currently in Pain?  Yes    Pain Score  6     Pain Location  Back    Pain Orientation  Lower;Right;Mid throacic spine to lumbar    Pain Descriptors / Indicators  Aching    Pain Type  Chronic pain    Pain Onset  More than a month ago    Pain Frequency  Constant    Aggravating Factors   standing for prolonged periods of time    Pain Relieving Factors  Lying down and performing DKTC stretch.     Multiple Pain Sites  Yes    Pain Score  4    Pain Location  Neck    Pain Orientation  Right;Left;Posterior    Pain Descriptors / Indicators  Aching    Pain Type  Chronic pain    Pain Onset  More than a month ago    Pain Frequency  Intermittent    Aggravating Factors   unknown    Pain Relieving Factors  heating   pad, lying down                      [facility] Adult PT Treatment/Exercise - 08/25/17 1408      Ambulation/Gait   Ambulation/Gait  Yes    Ambulation/Gait Assistance  5: Supervision;4: Min guard;4: Min assist    Ambulation/Gait Assistance Details  Pt amb. over even terrain with cues to improve heel strike and to improve lateral weight shifting to negotiate obstacles-cones and hurdles. Pt required min A during 1 LOB episodes while negotiating short hurdles (3x4 reps). Pt required seated rest breaks 2/2 back pain and fatigue.    Ambulation Distance (Feet)  345 Feet 115'x3    Assistive device  None    Gait Pattern  Step-through pattern;Decreased stance time - left;Decreased arm swing - right;Decreased step length - right;Decreased stride length;Decreased dorsiflexion - left;Decreased weight shift to left;Antalgic;Trunk flexed    Ambulation Surface  Level;Indoor        PWR [facility]) - 08/25/17 1546    PWR! Up  2x10reps    PWR! Rock  2x10reps    PWR! Twist  2x10reps    PWR Step  2x10reps    Comments  Pt  performed in standing with demo, tactile and verbal cues. Performed with min guard to S to ensure safety. Not provided as HEP, as pt requires further instruction and balance for safety. Pt required seated rest breaks 2/2 fatigue and back pain.         PT Education - 08/25/17 1547    Education provided  Yes    Education Details  PT educated pt to continue current HEP as prescribed and to NOT perform standing PWR! HEP, due to impaired balance.     Person(s) Educated  Patient    Methods  Explanation    Comprehension  Verbalized understanding       PT Short Term Goals - 08/18/17 1637      PT SHORT TERM GOAL #1   Title  Patient is independent with inital HEP. (All STGs Target Date: 08/18/17)    Baseline  MET 08/16/2017    Time  1    Period  Months    Status  Achieved    Target Date  08/18/17      PT SHORT TERM GOAL #2   Title  Patient is able to ambulate around furniture & carry conversation without significant balance losses requiring physical assist or touch on furniture.     Baseline  MET 08/16/2017    Time  1    Period  Months    Status  Achieved    Target Date  08/18/17      PT SHORT TERM GOAL #3   Title  Standard Timed Up & Go <13.5sec    Baseline  11/21: 11.22 sec    Time  1    Period  Months    Status  Achieved    Target Date  08/18/17      PT SHORT TERM GOAL #4   Title  Patient verbalizes understanding of fall prevention strategies including use of assistive device to decrease back pain.     Baseline  MET 08/16/2017    Time  1    Period  Months    Status  Achieved    Target Date  08/18/17      PT SHORT TERM GOAL #5   Title  Pt will demonstrate improved neck rotation ROM (to L) to allow for improved use of VOR  as demonstrated by ability to perform x 1 viewing in standing with feet apart x 30 seconds each with supervision    Baseline  11/21: Cervical ROM: Left- 61*, Right 69*    Time  1    Period  Months    Status  Achieved    Target Date  08/18/17         PT Long Term Goals - 07/21/17 1642      PT LONG TERM GOAL #1   Title  Patient verbalizes & demonstrates understanding of ongoing fitness plan. (All LTGs Target Date: 09/17/17)    Time  2    Period  Months    Status  On-going    Target Date  09/17/17      PT LONG TERM GOAL #2   Title  Patient reports pain increases </= 2 increments on 0-10 scale with standing & gait activities.     Time  2    Period  Months    Status  On-going    Target Date  09/17/17      PT LONG TERM GOAL #3   Title  Berg Balance Test >/= 45/56    Time  2    Period  Months    Status  On-going    Target Date  09/17/17      PT LONG TERM GOAL #4   Title  Timed Up-Go cognitive with continuous naming tasks and increase <50% from standard TUG.     Time  2    Period  Months    Status  On-going    Target Date  09/17/17      PT LONG TERM GOAL #5   Title  Patient ambulates 500' with LRAD modified independent for community mobility.    Time  2    Period  Months    Status  On-going    Target Date  09/17/17      Additional Long Term Goals   Additional Long Term Goals  Yes      PT LONG TERM GOAL #6   Title  Patient negotiates ramps, curbs & stairs with LRAD modified independent for community access.     Time  2    Period  Months    Status  On-going    Target Date  09/17/17      PT LONG TERM GOAL #7   Title  Activities of Balance Confidence increases to >/= 45%    Time  2    Period  Months    Status  On-going    Target Date  09/17/17      PT LONG TERM GOAL #8   Title  Pt will improve use of VOR/gaze stability as indicated by ability to perform x 1 viewing in standing x 60 seconds and reports 0/5 dizziness with head turns, nods and pivoting    Time  2    Period  Months    Status  New    Target Date  09/17/17            Plan - 08/25/17 1553    Clinical Impression Statement  Pt demonstrated progress, as she was able to perform dynamic gait activities such as negotiating over and around  obstacles. Pt continues to require cues to improve heel strike and lateral weight shifting during gait and balance activities. Pt would continue to benefit from skilled PT to improve safety during functional mobility.     Clinical Impairments Affecting Rehab Potential  HTN, depressive disorder,   osteopenia, bil wrist fx's with falls left '08 & right '02, idiopathic thoracic scoliosis, LBP,     PT Frequency  2x / week    PT Duration  Other (comment)    PT Treatment/Interventions  ADLs/Self Care Home Management;DME Instruction;Gait training;Moist Heat;Ultrasound;Canalith Repostioning;Stair training;Functional mobility training;Cryotherapy;Therapeutic activities;Therapeutic exercise;Balance training;Neuromuscular re-education;Patient/family education;Orthotic Fit/Training;Vestibular    PT Next Visit Plan  G-CODE AND PN. Continue with PWR moves in standing,  Neuromuscular standing exercises for strength & balance; stairs; gait including scanning & negotiating obstacles    Consulted and Agree with Plan of Care  Patient       Patient will benefit from skilled therapeutic intervention in order to improve the following deficits and impairments:  Abnormal gait, Decreased activity tolerance, Decreased balance, Decreased endurance, Decreased knowledge of use of DME, Decreased mobility, Decreased range of motion, Decreased strength, Postural dysfunction, Pain, Impaired flexibility  Visit Diagnosis: Other abnormalities of gait and mobility  Unsteadiness on feet  Muscle weakness (generalized)  Abnormal posture     Problem List Patient Active Problem List   Diagnosis Date Noted  . Symptomatic anemia 04/20/2016  . NSAID long-term use 04/20/2016  . Scoliosis 04/20/2016  . Depression 04/20/2016  . HTN (hypertension) 04/20/2016  . GERD (gastroesophageal reflux disease) 04/20/2016  . Occult GI bleeding 04/20/2016    Miller,Jennifer L 08/25/2017, 3:55 PM  [facility] [address] [place], [place], [address] Phone: [phone]   Fax:  [phone]  Name: Elaine Rivera MRN: [id number] Date of Birth: 04/09/1946  Jennifer Miller, PT,DPT 08/25/17 3:56 PM Phone: [phone] Fax: [phone]   

## 2017-09-01 ENCOUNTER — Encounter: Payer: Self-pay | Admitting: Physical Therapy

## 2017-09-01 ENCOUNTER — Ambulatory Visit: Payer: Medicare Other | Attending: Family Medicine | Admitting: Physical Therapy

## 2017-09-01 DIAGNOSIS — R293 Abnormal posture: Secondary | ICD-10-CM

## 2017-09-01 DIAGNOSIS — M6281 Muscle weakness (generalized): Secondary | ICD-10-CM | POA: Diagnosis present

## 2017-09-01 DIAGNOSIS — R2689 Other abnormalities of gait and mobility: Secondary | ICD-10-CM | POA: Diagnosis present

## 2017-09-01 DIAGNOSIS — R42 Dizziness and giddiness: Secondary | ICD-10-CM | POA: Diagnosis present

## 2017-09-01 DIAGNOSIS — R296 Repeated falls: Secondary | ICD-10-CM | POA: Diagnosis present

## 2017-09-01 DIAGNOSIS — R2681 Unsteadiness on feet: Secondary | ICD-10-CM | POA: Diagnosis present

## 2017-09-02 NOTE — Therapy (Signed)
[facility] [address] [place], [place], [address] Phone: [phone]   Fax:  [phone]  Physical Therapy Treatment  Patient Details  Name: Elaine Rivera MRN: [id number] Date of Birth: 1946-02-18 Referring Provider: Carol Ada, MD   Encounter Date: 09/01/2017  PT End of Session - 09/01/17 1629    Visit Number  10    Number of Visits  18    Date for PT Re-Evaluation  09/17/17    Authorization Type  [employer] Medicare G-code & progress note every 10 visits    PT Start Time  1400    PT Stop Time  1445    PT Time Calculation (min)  45 min    Equipment Utilized During Treatment  -- min A-S prn    Activity Tolerance  Patient tolerated treatment well    Behavior During Therapy  [facility] for tasks assessed/performed       Past Medical History:  Diagnosis Date  . Basal cell carcinoma of eye    biopsy of left eye/ non cancerous  . Complication of anesthesia    nausea/vomiting  . Depression   . Hypertension   . Kidney stones    twice/ [place]    Past Surgical History:  Procedure Laterality Date  . CEREBRAL ANEURYSM REPAIR     1998  . ECTOPIC PREGNANCY SURGERY     x2   . ESOPHAGOGASTRODUODENOSCOPY N/A 04/22/2016   Procedure: ESOPHAGOGASTRODUODENOSCOPY (EGD);  Surgeon: Juanita Craver, MD;  Location: [facility];  Service: Endoscopy;  Laterality: N/A;  . PARATHYROIDECTOMY     2002  . TONSILLECTOMY     1953  . VAGINAL HYSTERECTOMY     2001  . VOCAL CORD LATERALIZATION, ENDOSCOPIC APPROACH W/ MLB     2007 ('@[facility]' )  . WRIST SURGERY     left side/2008  . WRIST SURGERY     right side/ 2002    There were no vitals filed for this visit.  Subjective Assessment - 09/01/17 1406    Subjective  Patient denies any more falls. The exercises are going well. Patient feels PT is helping her a lot and she would like to continue PT to further improve her mobility.     Pertinent History  HTN, depressive disorder, osteopenia,  bil wrist fx's with falls left '08 & right '02, idiopathic thoracic scoliosis, LBP; has been a while since she has had a bone density assessment    Limitations  Lifting;Walking;Standing;House hold activities    Patient Stated Goals  Walk & stand without significant pain.     Currently in Pain?  Yes    Pain Score  4     Pain Location  Back    Pain Orientation  Left;Lower;Mid    Pain Descriptors / Indicators  Aching    Pain Type  Chronic pain    Pain Onset  More than a month ago    Pain Frequency  Constant    Aggravating Factors   standing for prolonged periods    Pain Relieving Factors  lying down & performing stretch    Effect of Pain on Daily Activities  limits standing & walking         [facility] PT Assessment - 09/01/17 1400      Timed Up and Go Test   Normal TUG (seconds)  9.96    Cognitive TUG (seconds)  14.31 difficulty continueing to name items      Therapeutic Exercise: PT reviewed HEP using Epic notes. Pt  verbalized understanding.  PT added PWR! Standing to HEP. See patient education. PT instructed patient in benefits of seated recumbent machines that use BUEs & BLEs and how to set-up. She performed level 3 for 5 minutes with cueing.                PWR [facility]) - 09/01/17 1400    PWR! Up  10 reps    PWR! Rock  10 reps    PWR! Twist  10 reps    PWR Step  10 reps          PT Education - 09/01/17 1430    Education provided  Yes    Education Details  Added staniding PWR! to HEP. Reviewed HEP with cues to break up during day. Fitness plan to include flexibility, strength, balance & endurance components, Using Silver Sneakers at [facility] for seated recumbent machines that use BUEs & BLEs for endurance and Chair/Silver Sneakers Yoga    Person(s) Educated  Patient    Methods  Explanation;Demonstration;Verbal cues    Comprehension  Verbalized understanding;Need further instruction       PT Short Term Goals - 08/18/17 1637      PT SHORT TERM GOAL #1   Title   Patient is independent with inital HEP. (All STGs Target Date: 08/18/17)    Baseline  MET 08/16/2017    Time  1    Period  Months    Status  Achieved    Target Date  08/18/17      PT SHORT TERM GOAL #2   Title  Patient is able to ambulate around furniture & carry conversation without significant balance losses requiring physical assist or touch on furniture.     Baseline  MET 08/16/2017    Time  1    Period  Months    Status  Achieved    Target Date  08/18/17      PT SHORT TERM GOAL #3   Title  Standard Timed Up & Go <13.5sec    Baseline  11/21: 11.22 sec    Time  1    Period  Months    Status  Achieved    Target Date  08/18/17      PT SHORT TERM GOAL #4   Title  Patient verbalizes understanding of fall prevention strategies including use of assistive device to decrease back pain.     Baseline  MET 08/16/2017    Time  1    Period  Months    Status  Achieved    Target Date  08/18/17      PT SHORT TERM GOAL #5   Title  Pt will demonstrate improved neck rotation ROM (to L) to allow for improved use of VOR as demonstrated by ability to perform x 1 viewing in standing with feet apart x 30 seconds each with supervision    Baseline  11/21: Cervical ROM: Left- 61*, Right 69*    Time  1    Period  Months    Status  Achieved    Target Date  08/18/17        PT Long Term Goals - 07/21/17 1642      PT LONG TERM GOAL #1   Title  Patient verbalizes & demonstrates understanding of ongoing fitness plan. (All LTGs Target Date: 09/17/17)    Time  2    Period  Months    Status  On-going    Target Date  09/17/17      PT  LONG TERM GOAL #2   Title  Patient reports pain increases </= 2 increments on 0-10 scale with standing & gait activities.     Time  2    Period  Months    Status  On-going    Target Date  09/17/17      PT LONG TERM GOAL #3   Title  Berg Balance Test >/= 45/56    Time  2    Period  Months    Status  On-going    Target Date  09/17/17      PT LONG TERM GOAL #4    Title  Timed Up-Go cognitive with continuous naming tasks and increase <50% from standard TUG.     Time  2    Period  Months    Status  On-going    Target Date  09/17/17      PT LONG TERM GOAL #5   Title  Patient ambulates 500' with LRAD modified independent for community mobility.    Time  2    Period  Months    Status  On-going    Target Date  09/17/17      Additional Long Term Goals   Additional Long Term Goals  Yes      PT LONG TERM GOAL #6   Title  Patient negotiates ramps, curbs & stairs with LRAD modified independent for community access.     Time  2    Period  Months    Status  On-going    Target Date  09/17/17      PT LONG TERM GOAL #7   Title  Activities of Balance Confidence increases to >/= 45%    Time  2    Period  Months    Status  On-going    Target Date  09/17/17      PT LONG TERM GOAL #8   Title  Pt will improve use of VOR/gaze stability as indicated by ability to perform x 1 viewing in standing x 60 seconds and reports 0/5 dizziness with head turns, nods and pivoting    Time  2    Period  Months    Status  New    Target Date  09/17/17            Plan - 09/01/17 1640    Clinical Impression Statement  Patient seems to understand standing PWR! exercises to perform at home near counter. She would benefit from adding exercising at [facility] to her routine. Patient may benefit from consult with Dr. Wells Guiles Tat neurologist specializing in movement disorders. She verbalized understanding of recommendation.     Clinical Impairments Affecting Rehab Potential  HTN, depressive disorder, osteopenia, bil wrist fx's with falls left '08 & right '02, idiopathic thoracic scoliosis, LBP,     PT Frequency  2x / week    PT Duration  Other (comment)    PT Treatment/Interventions  ADLs/Self Care Home Management;DME Instruction;Gait training;Moist Heat;Ultrasound;Canalith Repostioning;Stair training;Functional mobility training;Cryotherapy;Therapeutic activities;Therapeutic  exercise;Balance training;Neuromuscular re-education;Patient/family education;Orthotic Fit/Training;Vestibular    PT Next Visit Plan  Neuromuscular standing exercises for strength & balance; stairs; gait including scanning & negotiating obstacles; review set-up of NuStep for use at [facility] and Agree with Plan of Care  Patient       Patient will benefit from skilled therapeutic intervention in order to improve the following deficits and impairments:  Abnormal gait, Decreased activity tolerance, Decreased balance, Decreased endurance, Decreased knowledge of use of DME, Decreased  mobility, Decreased range of motion, Decreased strength, Postural dysfunction, Pain, Impaired flexibility  Visit Diagnosis: Other abnormalities of gait and mobility  Unsteadiness on feet  Muscle weakness (generalized)  Abnormal posture   G-Codes - 09/28/17 1636    Functional Assessment Tool Used (Outpatient Only)  Timed Up-Go 9.96sec (Initial was 14.43sec), Cognitive TUG 14.31: 44% increase & delayed naming.  (Initial was 26.94sec: 87% increase & stopped naming)    Functional Limitation  Mobility: Walking and moving around    Mobility: Walking and Moving Around Current Status [phone])  At least 20 percent but less than 40 percent impaired, limited or restricted    Mobility: Walking and Moving Around Goal Status [phone])  At least 1 percent but less than 20 percent impaired, limited or restricted       Problem List Patient Active Problem List   Diagnosis Date Noted  . Symptomatic anemia 04/20/2016  . NSAID long-term use 04/20/2016  . Scoliosis 04/20/2016  . Depression 04/20/2016  . HTN (hypertension) 04/20/2016  . GERD (gastroesophageal reflux disease) 04/20/2016  . Occult GI bleeding 04/20/2016   Physical Therapy Progress Note  Dates of Reporting Period: 07/19/2017 to 2017-09-28  Objective Reports of Subjective Statement: Patient reports improved mobility but feels additional PT will help more.    Objective Measurements: see above  Goal Update: see above  Plan: Continued established plan   Reason Skilled Services are Required: Patient continues to have balance & mobility deficits with increased high fall risk.     Jamey Reas PT, DPT 09/02/2017, 6:44 AM  [facility] [address] [place], [place], [address] Phone: [phone]   Fax:  [phone]  Name: Elaine Rivera MRN: [id number] Date of Birth: 14-Jun-1946

## 2017-09-03 ENCOUNTER — Ambulatory Visit: Payer: Medicare Other | Admitting: Physical Therapy

## 2017-09-06 ENCOUNTER — Ambulatory Visit: Payer: Medicare Other | Admitting: Physical Therapy

## 2017-09-10 ENCOUNTER — Ambulatory Visit: Payer: Medicare Other | Admitting: Physical Therapy

## 2017-09-13 ENCOUNTER — Ambulatory Visit: Payer: Medicare Other | Admitting: Physical Therapy

## 2017-09-13 ENCOUNTER — Encounter: Payer: Self-pay | Admitting: Physical Therapy

## 2017-09-13 DIAGNOSIS — R2689 Other abnormalities of gait and mobility: Secondary | ICD-10-CM

## 2017-09-13 DIAGNOSIS — R293 Abnormal posture: Secondary | ICD-10-CM

## 2017-09-13 DIAGNOSIS — R2681 Unsteadiness on feet: Secondary | ICD-10-CM

## 2017-09-13 DIAGNOSIS — M6281 Muscle weakness (generalized): Secondary | ICD-10-CM

## 2017-09-13 DIAGNOSIS — R42 Dizziness and giddiness: Secondary | ICD-10-CM

## 2017-09-14 NOTE — Therapy (Signed)
[facility] [address] [place], [place], [address] Phone: [phone]   Fax:  [phone]  Physical Therapy Treatment  Patient Details  Name: Elaine Rivera MRN: [id number] Date of Birth: Jun 27, 1946 Referring Provider: Carol Ada, MD   Encounter Date: 09/13/2017  PT End of Session - 09/13/17 1756    Visit Number  11    Number of Visits  18    Date for PT Re-Evaluation  09/17/17    Authorization Type  [employer] Medicare G-code & progress note every 10 visits    PT Start Time  1400    PT Stop Time  1444    PT Time Calculation (min)  44 min    Equipment Utilized During Treatment  -- min A-S prn    Activity Tolerance  Patient tolerated treatment well    Behavior During Therapy  [facility] for tasks assessed/performed       Past Medical History:  Diagnosis Date  . Basal cell carcinoma of eye    biopsy of left eye/ non cancerous  . Complication of anesthesia    nausea/vomiting  . Depression   . Hypertension   . Kidney stones    twice/ [place]    Past Surgical History:  Procedure Laterality Date  . CEREBRAL ANEURYSM REPAIR     1998  . ECTOPIC PREGNANCY SURGERY     x2   . ESOPHAGOGASTRODUODENOSCOPY N/A 04/22/2016   Procedure: ESOPHAGOGASTRODUODENOSCOPY (EGD);  Surgeon: Juanita Craver, MD;  Location: [facility];  Service: Endoscopy;  Laterality: N/A;  . PARATHYROIDECTOMY     2002  . TONSILLECTOMY     1953  . VAGINAL HYSTERECTOMY     2001  . VOCAL CORD LATERALIZATION, ENDOSCOPIC APPROACH W/ MLB     2007 (_0 )  . WRIST SURGERY     left side/2008  . WRIST SURGERY     right side/ 2002    There were no vitals filed for this visit.  Subjective Assessment - 09/13/17 1359    Subjective  No falls. She stayed inside, out of snow. She stays tired.     Pertinent History  HTN, depressive disorder, osteopenia, bil wrist fx's with falls left '08 & right '02, idiopathic thoracic scoliosis, LBP; has been a while  since she has had a bone density assessment    Limitations  Lifting;Walking;Standing;House hold activities    Patient Stated Goals  Walk & stand without significant pain.     Currently in Pain?  No/denies         [facility] PT Assessment - 09/13/17 1400      6 Minute Walk- Baseline   6 Minute Walk- Baseline  yes    HR (bpm)  82    02 Sat (%RA)  97 %      6 Minute walk- Post Test   6 Minute Walk Post Test  yes    HR (bpm)  120    02 Sat (%RA)  99 %    Modified Borg Scale for Dyspnea  8-    Perceived Rate of Exertion (Borg)  15- Hard      6 minute walk test results    Aerobic Endurance Distance Walked  818    Endurance additional comments  seated rest at 44mn for 30 sec at 430', ended 628mutes at 60m10m40 sec due to fatigue. Her gait deviations increased shuffling with fatigue.       Berg Balance Test   Sit to Stand  Able to  stand without using hands and stabilize independently    Standing Unsupported  Able to stand safely 2 minutes    Sitting with Back Unsupported but Feet Supported on Floor or Stool  Able to sit safely and securely 2 minutes    Stand to Sit  Sits safely with minimal use of hands    Transfers  Able to transfer safely, minor use of hands    Standing Unsupported with Eyes Closed  Able to stand 10 seconds safely    Standing Ubsupported with Feet Together  Able to place feet together independently but unable to hold for 30 seconds    From Standing, Reach Forward with Outstretched Arm  Can reach confidently >25 cm (10")    From Standing Position, Pick up Object from Floor  Able to pick up shoe safely and easily    From Standing Position, Turn to Look Behind Over each Shoulder  Looks behind from both sides and weight shifts well    Turn 360 Degrees  Able to turn 360 degrees safely in 4 seconds or less    Standing Unsupported, Alternately Place Feet on Step/Stool  Able to complete 4 steps without aid or supervision    Standing Unsupported, One Foot in Front  Able to plae  foot ahead of the other independently and hold 30 seconds    Standing on One Leg  Tries to lift leg/unable to hold 3 seconds but remains standing independently    Total Score  48    Berg comment:  Initial was 38/56      Timed Up and Go Test   Normal TUG (seconds)  12.6 Initial was 14.43sec    Cognitive TUG (seconds)  14.39 Initial was 26.94sec                            PT Short Term Goals - 08/18/17 1637      PT SHORT TERM GOAL #1   Title  Patient is independent with inital HEP. (All STGs Target Date: 08/18/17)    Baseline  MET 08/16/2017    Time  1    Period  Months    Status  Achieved    Target Date  08/18/17      PT SHORT TERM GOAL #2   Title  Patient is able to ambulate around furniture & carry conversation without significant balance losses requiring physical assist or touch on furniture.     Baseline  MET 08/16/2017    Time  1    Period  Months    Status  Achieved    Target Date  08/18/17      PT SHORT TERM GOAL #3   Title  Standard Timed Up & Go <13.5sec    Baseline  11/21: 11.22 sec    Time  1    Period  Months    Status  Achieved    Target Date  08/18/17      PT SHORT TERM GOAL #4   Title  Patient verbalizes understanding of fall prevention strategies including use of assistive device to decrease back pain.     Baseline  MET 08/16/2017    Time  1    Period  Months    Status  Achieved    Target Date  08/18/17      PT SHORT TERM GOAL #5   Title  Pt will demonstrate improved neck rotation ROM (to L) to allow for improved use of VOR  as demonstrated by ability to perform x 1 viewing in standing with feet apart x 30 seconds each with supervision    Baseline  11/21: Cervical ROM: Left- 61*, Right 69*    Time  1    Period  Months    Status  Achieved    Target Date  08/18/17        PT Long Term Goals - 09/13/17 1758      PT LONG TERM GOAL #1   Title  Patient verbalizes & demonstrates understanding of ongoing fitness plan. (All LTGs  Target Date: 09/17/17)    Time  2    Period  Months    Status  On-going    Target Date  09/17/17      PT LONG TERM GOAL #2   Title  Patient reports pain increases </= 2 increments on 0-10 scale with standing & gait activities.     Time  2    Period  Months    Status  On-going    Target Date  09/17/17      PT LONG TERM GOAL #3   Title  Berg Balance Test >/= 45/56    Baseline  MET 09/13/2017  Berg Balance 48/56    Time  2    Period  Months    Status  Achieved      PT LONG TERM GOAL #4   Title  Timed Up-Go cognitive with continuous naming tasks and increase <50% from standard TUG.     Baseline  MET 09/13/2017  Std TUG 12.6sec and cog TUG 14.39sec (14% increase) both WNL    Time  2    Period  Months    Status  Achieved      PT LONG TERM GOAL #5   Title  Patient ambulates 500' with LRAD modified independent for community mobility.    Time  2    Period  Months    Status  On-going    Target Date  09/17/17      PT LONG TERM GOAL #6   Title  Patient negotiates ramps, curbs & stairs with LRAD modified independent for community access.     Time  2    Period  Months    Status  On-going    Target Date  09/17/17      PT LONG TERM GOAL #7   Title  Activities of Balance Confidence increases to >/= 45%    Time  2    Period  Months    Status  On-going    Target Date  09/17/17      PT LONG TERM GOAL #8   Title  Pt will improve use of VOR/gaze stability as indicated by ability to perform x 1 viewing in standing x 60 seconds and reports 0/5 dizziness with head turns, nods and pivoting    Time  2    Period  Months    Status  New    Target Date  09/17/17            Plan - 09/13/17 1800    Clinical Impression Statement  Patient met 2 LTGs checked today. PT also performed 6-Min Walk Test which patient had increased respirations & decline in ambulation /gait deviations with fatigue. Patient will probably  benefit from additional PT after this plan of care.     Clinical  Impairments Affecting Rehab Potential  HTN, depressive disorder, osteopenia, bil wrist fx's with falls left '08 & right '02, idiopathic thoracic scoliosis,  LBP,     PT Frequency  2x / week    PT Duration  Other (comment)    PT Treatment/Interventions  ADLs/Self Care Home Management;DME Instruction;Gait training;Moist Heat;Ultrasound;Canalith Repostioning;Stair training;Functional mobility training;Cryotherapy;Therapeutic activities;Therapeutic exercise;Balance training;Neuromuscular re-education;Patient/family education;Orthotic Fit/Training;Vestibular    PT Next Visit Plan  check remaining LTGs, perform FGA, recertify for 8 weeks at 2x/wk    Consulted and Agree with Plan of Care  Patient       Patient will benefit from skilled therapeutic intervention in order to improve the following deficits and impairments:  Abnormal gait, Decreased activity tolerance, Decreased balance, Decreased endurance, Decreased knowledge of use of DME, Decreased mobility, Decreased range of motion, Decreased strength, Postural dysfunction, Pain, Impaired flexibility  Visit Diagnosis: Other abnormalities of gait and mobility  Unsteadiness on feet  Muscle weakness (generalized)  Abnormal posture  Dizziness and giddiness     Problem List Patient Active Problem List   Diagnosis Date Noted  . Symptomatic anemia 04/20/2016  . NSAID long-term use 04/20/2016  . Scoliosis 04/20/2016  . Depression 04/20/2016  . HTN (hypertension) 04/20/2016  . GERD (gastroesophageal reflux disease) 04/20/2016  . Occult GI bleeding 04/20/2016    Elaine Rivera PT, DPT 09/14/2017, 8:04 AM  [facility] [address] [place], [place], [address] Phone: [phone]   Fax:  [phone]  Name: Elaine Rivera MRN: [id number] Date of Birth: 01-Feb-1946

## 2017-09-15 ENCOUNTER — Ambulatory Visit: Payer: Medicare Other | Admitting: Physical Therapy

## 2017-09-15 ENCOUNTER — Encounter: Payer: Self-pay | Admitting: Physical Therapy

## 2017-09-15 DIAGNOSIS — R293 Abnormal posture: Secondary | ICD-10-CM

## 2017-09-15 DIAGNOSIS — M6281 Muscle weakness (generalized): Secondary | ICD-10-CM

## 2017-09-15 DIAGNOSIS — R2681 Unsteadiness on feet: Secondary | ICD-10-CM

## 2017-09-15 DIAGNOSIS — R2689 Other abnormalities of gait and mobility: Secondary | ICD-10-CM

## 2017-09-15 DIAGNOSIS — R296 Repeated falls: Secondary | ICD-10-CM

## 2017-09-15 DIAGNOSIS — R42 Dizziness and giddiness: Secondary | ICD-10-CM

## 2017-09-16 NOTE — Therapy (Signed)
[facility] [address] [place], [place], [address] Phone: [phone]   Fax:  [phone]  Physical Therapy Treatment  Patient Details  Name: Elaine Rivera MRN: [id number] Date of Birth: 06-02-1946 Referring Provider: Carol Ada, MD   Encounter Date: 09/15/2017  PT End of Session - 09/15/17 1936    Visit Number  12    Number of Visits  28    Date for PT Re-Evaluation  09/17/17    Authorization Type  [employer] Medicare G-code & progress note every 10 visits    PT Start Time  1400    PT Stop Time  1445    PT Time Calculation (min)  45 min    Equipment Utilized During Treatment  -- min A-S prn    Activity Tolerance  Patient tolerated treatment well    Behavior During Therapy  [facility] for tasks assessed/performed       Past Medical History:  Diagnosis Date  . Basal cell carcinoma of eye    biopsy of left eye/ non cancerous  . Complication of anesthesia    nausea/vomiting  . Depression   . Hypertension   . Kidney stones    twice/ [place]    Past Surgical History:  Procedure Laterality Date  . CEREBRAL ANEURYSM REPAIR     1998  . ECTOPIC PREGNANCY SURGERY     x2   . ESOPHAGOGASTRODUODENOSCOPY N/A 04/22/2016   Procedure: ESOPHAGOGASTRODUODENOSCOPY (EGD);  Surgeon: Juanita Craver, MD;  Location: [facility];  Service: Endoscopy;  Laterality: N/A;  . PARATHYROIDECTOMY     2002  . TONSILLECTOMY     1953  . VAGINAL HYSTERECTOMY     2001  . VOCAL CORD LATERALIZATION, ENDOSCOPIC APPROACH W/ MLB     2007 ('@[facility]' )  . WRIST SURGERY     left side/2008  . WRIST SURGERY     right side/ 2002    There were no vitals filed for this visit.  Subjective Assessment - 09/15/17 1406    Subjective  No falls. She feels PT has helped but she needs additional to go further.     Pertinent History  HTN, depressive disorder, osteopenia, bil wrist fx's with falls left '08 & right '02, idiopathic thoracic scoliosis, LBP; has  been a while since she has had a bone density assessment    Limitations  Lifting;Walking;Standing;House hold activities    Patient Stated Goals  Walk & stand without significant pain.     Currently in Pain?  Yes    Pain Score  3     Pain Location  Back    Pain Orientation  Left;Lower;Mid    Pain Descriptors / Indicators  Aching    Pain Type  Chronic pain    Pain Onset  More than a month ago    Pain Frequency  Constant    Aggravating Factors   standing for prolonged periods    Pain Relieving Factors  lying down & performing stretch    Effect of Pain on Daily Activities  limits standing & walking          [facility] PT Assessment - 09/15/17 1400      Functional Gait  Assessment   Gait assessed   Yes    Gait Level Surface  Walks 20 ft in less than 7 sec but greater than 5.5 sec, uses assistive device, slower speed, mild gait deviations, or deviates 6-10 in outside of the 12 in walkway width.    Change  in Gait Speed  Makes only minor adjustments to walking speed, or accomplishes a change in speed with significant gait deviations, deviates 10-15 in outside the 12 in walkway width, or changes speed but loses balance but is able to recover and continue walking.    Gait with Horizontal Head Turns  Performs head turns with moderate changes in gait velocity, slows down, deviates 10-15 in outside 12 in walkway width but recovers, can continue to walk.    Gait with Vertical Head Turns  Performs task with moderate change in gait velocity, slows down, deviates 10-15 in outside 12 in walkway width but recovers, can continue to walk.    Gait and Pivot Turn  Turns slowly, requires verbal cueing, or requires several small steps to catch balance following turn and stop    Step Over Obstacle  Is able to step over one shoe box (4.5 in total height) but must slow down and adjust steps to clear box safely. May require verbal cueing.    Gait with Narrow Base of Support  Ambulates 4-7 steps.    Gait with Eyes Closed   Walks 20 ft, slow speed, abnormal gait pattern, evidence for imbalance, deviates 10-15 in outside 12 in walkway width. Requires more than 9 sec to ambulate 20 ft.    Ambulating Backwards  Walks 20 ft, slow speed, abnormal gait pattern, evidence for imbalance, deviates 10-15 in outside 12 in walkway width.    Steps  Alternating feet, must use rail.    Total Score  12                   Vestibular Treatment/Exercise - 09/15/17 1400      X1 Viewing Horizontal   Foot Position  standing with feet apart    Comments  60sec no dizziness but difficulty isolating cervical motion without thoracic motion PT worked on isolating cervical with light UE support chair      X1 Viewing Vertical   Foot Position  standing with feet apart    Comments  60sec no dizziness but difficulty isolating cervical motion without thoracic motion PT worked on isolating cervical with light UE support chair         Balance Exercises - 09/15/17 1400      Balance Exercises: Standing   Rockerboard  Anterior/posterior;Lateral;10 reps;Intermittent UE support PT tactile cues, visual feedback with mirror        PT Education - 09/15/17 1948    Education provided  Yes    Education Details  progressing VOR to standing initiating with UE support on chair back & progressing to no support.     Person(s) Educated  Patient    Methods  Explanation;Verbal cues;Demonstration;Tactile cues    Comprehension  Verbalized understanding;Returned demonstration;Need further instruction       PT Short Term Goals - 09/15/17 2003      PT SHORT TERM GOAL #1   Title  Patient demostrates understanding of updated HEP. (All STGs Target Date 10/22/2017)    Time  4    Period  Weeks    Status  New    Target Date  10/22/17      PT SHORT TERM GOAL #2   Title  Patient ambulates 500' without device with supervision.     Time  4    Period  Weeks    Status  New    Target Date  10/22/17      PT SHORT TERM GOAL #3   Title  Functional  Gait Assessment >/= 16/30    Time  4    Period  Weeks    Status  New    Target Date  10/22/17        PT Long Term Goals - 09/15/17 1937      PT LONG TERM GOAL #1   Title  Patient verbalizes & demonstrates understanding of ongoing fitness plan. (All LTGs Target Date: 09/17/2018)    Baseline  Progressing 09/15/2017 Patient needs additional instruction for advancing difficulty    Time  8    Period  Weeks    Status  On-going    Target Date  09/17/17      PT LONG TERM GOAL #2   Title  Patient reports pain increases </= 2 increments on 0-10 scale with standing & gait activities.     Baseline  MET with activities as current level on 09/15/2017    Time  2    Period  Months    Status  Partially Met    Target Date  09/17/17      PT LONG TERM GOAL #3   Title  Berg Balance Test >/= 45/56    Baseline  MET 09/13/2017  Berg Balance 48/56    Time  2    Period  Months    Status  Achieved      PT LONG TERM GOAL #4   Title  Timed Up-Go cognitive with continuous naming tasks and increase <50% from standard TUG.     Baseline  MET 09/13/2017  Std TUG 12.6sec and cog TUG 14.39sec (14% increase) both WNL    Time  2    Period  Months    Status  Achieved      PT LONG TERM GOAL #5   Title  Patient ambulates 500' with LRAD modified independent for community mobility.    Baseline  Progressing but not met 09/13/2017  Pt ambulated 430' during 6-minute walk test before requiring rest. Needs supervision as gait deviations with fatigue with instability    Time  2    Period  Months    Status  Not Met      PT LONG TERM GOAL #6   Title  Patient negotiates ramps, curbs & stairs with LRAD modified independent for community access.     Baseline  NOT MET 09/15/2017 Pt requires supervision / verbal cues for safety.    Time  2    Period  Months    Status  Not Met      PT LONG TERM GOAL #7   Title  Activities of Balance Confidence increases to >/= 45%    Baseline  not retested until discharge    Time   2    Period  Months    Status  On-going      PT LONG TERM GOAL #8   Title  Pt will improve use of VOR/gaze stability as indicated by ability to perform x 1 viewing in standing x 60 seconds and reports 0/5 dizziness with head turns, nods and pivoting    Baseline  Partially MET 09/15/2017 no dizziness but unable to isolate cervical motion    Time  2    Period  Months    Status  Partially Met         09/15/17 2010  PT LONG TERM GOAL #1  Title Patient verbalizes & demonstrates understanding of ongoing fitness plan. (All LTGs Target Date: 11/19/2017)  Time 8  Period Weeks  Status On-going  Target Date 11/19/17  PT LONG TERM GOAL #2  Title Patient reports pain increases </= 2 increments on 0-10 scale with standing & gait activities.   Time 2  Period Months  Status On-going  Target Date 11/19/17  PT LONG TERM GOAL #3  Title Berg Balance Test >/= 52/56  Time 2  Period Months  Status Revised  Target Date 11/19/17  PT LONG TERM GOAL #4  Title Functional Gait Assessment >/= 19/30  Time 2  Period Months  Status New  Target Date 11/19/17  PT LONG TERM GOAL #5  Title 6 Minute Walk Test >900' modified independent  Time 8  Period Weeks  Status New  Target Date 11/19/17  PT LONG TERM GOAL #6  Title Patient negotiates ramps, curbs & stairs with LRAD modified independent for community access.   Time 2  Period Months  Status On-going  Target Date 11/19/17  PT LONG TERM GOAL #7  Title Activities of Balance Confidence increases to >/= 45%  Time 2  Period Months  Status On-going  Target Date 11/19/17        Plan - 09/15/17 1952    Clinical Impression Statement  Patient met or partially met 5 of 7 LTGs. She appears could further improve her mobility, balance & gait with lower fall risk with additional skilled PT. Berg Balance of 48/56 still indicates 50% fall risk. Functional Gait Assessment 12/30 indicates fall risk with gait activities including scanning & negotiating  obstacles. 6 Minute Walk Test indicated increasing gait deviations with fatigue. She required 30sec seated rest at 13mnute mark at 430', then had to stop at 533m 40sec for total distance 818'.     Clinical Impairments Affecting Rehab Potential  HTN, depressive disorder, osteopenia, bil wrist fx's with falls left '08 & right '02, idiopathic thoracic scoliosis, LBP,     PT Frequency  2x / week    PT Duration  8 weeks    PT Treatment/Interventions  ADLs/Self Care Home Management;DME Instruction;Gait training;Moist Heat;Ultrasound;Canalith Repostioning;Stair training;Functional mobility training;Cryotherapy;Therapeutic activities;Therapeutic exercise;Balance training;Neuromuscular re-education;Patient/family education;Orthotic Fit/Training;Vestibular    PT Next Visit Plan  progressive balance & gait activities including compliant surfaces.    Consulted and Agree with Plan of Care  Patient       Patient will benefit from skilled therapeutic intervention in order to improve the following deficits and impairments:  Abnormal gait, Decreased activity tolerance, Decreased balance, Decreased endurance, Decreased knowledge of use of DME, Decreased mobility, Decreased range of motion, Decreased strength, Postural dysfunction, Pain, Impaired flexibility  Visit Diagnosis: Other abnormalities of gait and mobility  Unsteadiness on feet  Muscle weakness (generalized)  Abnormal posture  Dizziness and giddiness  Repeated falls       Problem List Patient Active Problem List   Diagnosis Date Noted  . Symptomatic anemia 04/20/2016  . NSAID long-term use 04/20/2016  . Scoliosis 04/20/2016  . Depression 04/20/2016  . HTN (hypertension) 04/20/2016  . GERD (gastroesophageal reflux disease) 04/20/2016  . Occult GI bleeding 04/20/2016    WAJamey ReasT, DPT 09/16/2017, 8:09 PM  [id number]:  [phone] Fax:  [phone]: ClTIYE HUWERN: [id number] of Birth: [birth year]

## 2017-09-29 ENCOUNTER — Ambulatory Visit: Payer: Medicare Other | Attending: Family Medicine | Admitting: Physical Therapy

## 2017-09-29 ENCOUNTER — Encounter: Payer: Self-pay | Admitting: Physical Therapy

## 2017-09-29 DIAGNOSIS — M6281 Muscle weakness (generalized): Secondary | ICD-10-CM | POA: Insufficient documentation

## 2017-09-29 DIAGNOSIS — R42 Dizziness and giddiness: Secondary | ICD-10-CM | POA: Insufficient documentation

## 2017-09-29 DIAGNOSIS — R2681 Unsteadiness on feet: Secondary | ICD-10-CM | POA: Diagnosis not present

## 2017-09-29 DIAGNOSIS — R2689 Other abnormalities of gait and mobility: Secondary | ICD-10-CM | POA: Insufficient documentation

## 2017-09-29 DIAGNOSIS — R296 Repeated falls: Secondary | ICD-10-CM | POA: Diagnosis present

## 2017-09-29 DIAGNOSIS — R293 Abnormal posture: Secondary | ICD-10-CM | POA: Diagnosis present
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
## 2017-09-29 NOTE — Patient Instructions (Addendum)
Do these exercises with back to a corner & chair back in front of you.    Feet Together, Head Motion - Eyes Open    With eyes open, feet together, move head slowly: right/left, up/down, diagonal up-right/down-left & diagonal up-left/down-right. Repeat __10__ times each head motion per session. Stand up with good posture between each head motion.   Copyright  VHI. All rights reserved.   Feet Apart, Head Motion - Eyes Closed    With eyes closed and feet shoulder width apart, move head slowly,  right/left, up/down, diagonal up-right/down-left & diagonal up-left/down-right. Repeat __10__ times each head motion per session. Stand up with good posture between each head motion.   Copyright  VHI. All rights reserved.   Feet Apart (Compliant Surface) Head Motion - Eyes Open    With eyes open, standing on compliant surface: ___foam or pillow_____, feet shoulder width apart, move head slowly:  right/left, up/down, diagonal up-right/down-left & diagonal up-left/down-right. Repeat __10__ times each head motion per session. Stand up with good posture between each head motion.   Copyright  VHI. All rights reserved.   Feet Apart (Compliant Surface) Head Motion - Eyes Closed    Stand on compliant surface: ___faom or pillow_____ with feet shoulder width apart. Touch one finger to back of chair. Close eyes and move head slowly,  right/left, up/down, diagonal up-right/down-left & diagonal up-left/down-right. Repeat __10__ times each head motion per session. Stand up with good posture between each head motion.   Copyright  VHI. All rights reserved.

## 2017-09-30 ENCOUNTER — Encounter: Payer: Self-pay | Admitting: Physical Therapy

## 2017-09-30 ENCOUNTER — Ambulatory Visit: Payer: Medicare Other | Admitting: Physical Therapy

## 2017-09-30 DIAGNOSIS — R2681 Unsteadiness on feet: Secondary | ICD-10-CM

## 2017-09-30 DIAGNOSIS — R2689 Other abnormalities of gait and mobility: Secondary | ICD-10-CM

## 2017-09-30 DIAGNOSIS — M6281 Muscle weakness (generalized): Secondary | ICD-10-CM

## 2017-09-30 DIAGNOSIS — R42 Dizziness and giddiness: Secondary | ICD-10-CM

## 2017-09-30 NOTE — Therapy (Signed)
[facility] [address] [place], [place], [address] Phone: [phone]   Fax:  [phone]  Physical Therapy Treatment  Patient Details  Name: Elaine Rivera MRN: [id number] Date of Birth: 18-Apr-1946 Referring Provider: Carol Ada, MD   Encounter Date: 09/30/2017  PT End of Session - 09/30/17 1322    Visit Number  14    Number of Visits  28    Date for PT Re-Evaluation  09/17/17    Authorization Type  [employer] Medicare G-code & progress note every 10 visits    PT Start Time  1319    PT Stop Time  1359    PT Time Calculation (min)  40 min    Equipment Utilized During Treatment  Gait belt    Activity Tolerance  Patient tolerated treatment well    Behavior During Therapy  WFL for tasks assessed/performed       Past Medical History:  Diagnosis Date  . Basal cell carcinoma of eye    biopsy of left eye/ non cancerous  . Complication of anesthesia    nausea/vomiting  . Depression   . Hypertension   . Kidney stones    twice/ [place]    Past Surgical History:  Procedure Laterality Date  . CEREBRAL ANEURYSM REPAIR     1998  . ECTOPIC PREGNANCY SURGERY     x2   . ESOPHAGOGASTRODUODENOSCOPY N/A 04/22/2016   Procedure: ESOPHAGOGASTRODUODENOSCOPY (EGD);  Surgeon: Juanita Craver, MD;  Location: [facility];  Service: Endoscopy;  Laterality: N/A;  . PARATHYROIDECTOMY     2002  . TONSILLECTOMY     1953  . VAGINAL HYSTERECTOMY     2001  . VOCAL CORD LATERALIZATION, ENDOSCOPIC APPROACH W/ MLB     2007 (@[facility] )  . WRIST SURGERY     left side/2008  . WRIST SURGERY     right side/ 2002    There were no vitals filed for this visit.  Subjective Assessment - 09/30/17 1321    Subjective  No new complaints. No falls.     Pertinent History  HTN, depressive disorder, osteopenia, bil wrist fx's with falls left '08 & right '02, idiopathic thoracic scoliosis, LBP; has been a while since she has had a bone density  assessment    Limitations  Lifting;Walking;Standing;House hold activities    Patient Stated Goals  Walk & stand without significant pain.     Currently in Pain?  Yes    Pain Score  4     Pain Location  Back    Pain Orientation  Lower;Mid    Pain Descriptors / Indicators  Aching    Pain Type  Chronic pain    Pain Frequency  Constant    Aggravating Factors   standing for prolonged times    Pain Relieving Factors  lying down and performing stretch           [facility] Adult PT Treatment/Exercise - 09/30/17 1346      High Level Balance   High Level Balance Activities  Marching forwards;Marching backwards;Head turns;Tandem walking head turns fwd/bwd, tandem fwd/bwd    High Level Balance Comments  on red mats next to counter top with intermittent to light UE touch to counter top: 3 laps each with min guard to min assist for balance. cues on posture and ex form/technique  Balance Exercises - 09/30/17 1332      Balance Exercises: Standing   Standing Eyes Opened  Wide (BOA);Head turns;Foam/compliant surface;Limitations;Narrow base of support (BOS)    Standing Eyes Closed  Wide (BOA);Head turns;Foam/compliant surface;Other reps (comment);30 secs;Limitations;Narrow base of support (BOS)      Balance Exercises: Standing   Standing Eyes Opened Limitations  on 2 pillows in corner with chair in front for safety: wide base of support progressing to narrow base of support for EO head movements left<>right, then up<>down. cues on weight shifting for balance with min guard to min assist for balance              Standing Eyes Closed Limitations  on 2 pillows in corner with chair in front for safety: wide  base of support progressing to narrow base of support: EC no head movements, progressing to EC head movements left<>right, then up<>down  with min guard to min assist for balance. occasional touch to wall/chair for support as well.                                  PT Short Term Goals - 09/15/17 2003      PT SHORT TERM GOAL #1   Title  Patient demostrates understanding of updated HEP. (All STGs Target Date 10/22/2017)    Time  4    Period  Weeks    Status  New    Target Date  10/22/17      PT SHORT TERM GOAL #2   Title  Patient ambulates 500' without device with supervision.     Time  4    Period  Weeks    Status  New    Target Date  10/22/17      PT SHORT TERM GOAL #3   Title  Functional Gait Assessment >/= 16/30    Time  4    Period  Weeks    Status  New    Target Date  10/22/17        PT Long Term Goals - 09/15/17 2010      PT LONG TERM GOAL #1   Title  Patient verbalizes & demonstrates understanding of ongoing fitness plan. (All LTGs Target Date: 11/19/2017)    Time  8    Period  Weeks    Status  On-going    Target Date  11/19/17      PT LONG TERM GOAL #2   Title  Patient reports pain increases </= 2 increments on 0-10 scale with standing & gait activities.     Time  2    Period  Months    Status  On-going    Target Date  11/19/17      PT LONG TERM GOAL #3   Title  Berg Balance Test >/= 52/56    Time  2    Period  Months    Status  Revised    Target Date  11/19/17      PT LONG TERM GOAL #4   Title  Functional Gait Assessment >/= 19/30    Time  2    Period  Months    Status  New    Target Date  11/19/17      PT LONG TERM GOAL #5   Title  6 Minute Walk Test >900' modified independent    Time  8    Period  Weeks    Status  New    Target Date  11/19/17      PT LONG TERM GOAL #6   Title  Patient negotiates ramps, curbs & stairs with LRAD modified independent for community access.     Time  2    Period  Months    Status  On-going    Target Date  11/19/17      PT LONG TERM GOAL #7   Title  Activities of Balance Confidence increases to >/= 45%    Baseline  --    Time  2    Period  Months    Status  On-going    Target Date  11/19/17            Plan - 09/30/17 1322    Clinical Impression  Statement  Today's skilled session focused on balance activities with emphasis on increased vestibular imput. No issues reported. Pt is progressing well toward goals and should benefit from continued PT to progress toward unmet goals.     Clinical Impairments Affecting Rehab Potential  HTN, depressive disorder, osteopenia, bil wrist fx's with falls left '08 & right '02, idiopathic thoracic scoliosis, LBP,     PT Frequency  2x / week    PT Duration  8 weeks    PT Treatment/Interventions  ADLs/Self Care Home Management;DME Instruction;Gait training;Moist Heat;Ultrasound;Canalith Repostioning;Stair training;Functional mobility training;Cryotherapy;Therapeutic activities;Therapeutic exercise;Balance training;Neuromuscular re-education;Patient/family education;Orthotic Fit/Training;Vestibular    PT Next Visit Plan  progressive balance & gait activities including compliant surfaces.    Consulted and Agree with Plan of Care  Patient       Patient will benefit from skilled therapeutic intervention in order to improve the following deficits and impairments:  Abnormal gait, Decreased activity tolerance, Decreased balance, Decreased endurance, Decreased knowledge of use of DME, Decreased mobility, Decreased range of motion, Decreased strength, Postural dysfunction, Pain, Impaired flexibility  Visit Diagnosis: Unsteadiness on feet  Other abnormalities of gait and mobility  Muscle weakness (generalized)  Dizziness and giddiness     Problem List Patient Active Problem List   Diagnosis Date Noted  . Symptomatic anemia 04/20/2016  . NSAID long-term use 04/20/2016  . Scoliosis 04/20/2016  . Depression 04/20/2016  . HTN (hypertension) 04/20/2016  . GERD (gastroesophageal reflux disease) 04/20/2016  . Occult GI bleeding 04/20/2016    Willow Ora, PTA, [facility] [address], [place], [place] [address] [phone] 09/30/17, 10:07 PM   Name: Elaine Rivera MRN: [id number] Date of Birth: Mar 07, 1946

## 2017-10-05 ENCOUNTER — Encounter: Payer: Self-pay | Admitting: Physical Therapy

## 2017-10-05 ENCOUNTER — Ambulatory Visit: Payer: Medicare Other | Admitting: Physical Therapy

## 2017-10-05 DIAGNOSIS — M6281 Muscle weakness (generalized): Secondary | ICD-10-CM

## 2017-10-05 DIAGNOSIS — R2681 Unsteadiness on feet: Secondary | ICD-10-CM

## 2017-10-05 DIAGNOSIS — R42 Dizziness and giddiness: Secondary | ICD-10-CM

## 2017-10-05 DIAGNOSIS — R2689 Other abnormalities of gait and mobility: Secondary | ICD-10-CM

## 2017-10-06 NOTE — Therapy (Signed)
[facility] [address] [place], [place], [address] Phone: [phone]   Fax:  [phone]  Physical Therapy Treatment  Patient Details  Name: Elaine Rivera MRN: [id number] Date of Birth: 1946-07-03 Referring Provider: Carol Ada, MD   Encounter Date: 10/05/2017  PT End of Session - 10/05/17 1324    Visit Number  15    Number of Visits  28    Date for PT Re-Evaluation  11/19/17    Authorization Type  [employer]HC Medicare     PT Start Time  1319    PT Stop Time  1400    PT Time Calculation (min)  41 min    Equipment Utilized During Treatment  Gait belt    Activity Tolerance  Patient tolerated treatment well    Behavior During Therapy  [facility] for tasks assessed/performed       Past Medical History:  Diagnosis Date  . Basal cell carcinoma of eye    biopsy of left eye/ non cancerous  . Complication of anesthesia    nausea/vomiting  . Depression   . Hypertension   . Kidney stones    twice/ [place]    Past Surgical History:  Procedure Laterality Date  . CEREBRAL ANEURYSM REPAIR     1998  . ECTOPIC PREGNANCY SURGERY     x2   . ESOPHAGOGASTRODUODENOSCOPY N/A 04/22/2016   Procedure: ESOPHAGOGASTRODUODENOSCOPY (EGD);  Surgeon: Juanita Craver, MD;  Location: [facility];  Service: Endoscopy;  Laterality: N/A;  . PARATHYROIDECTOMY     2002  . TONSILLECTOMY     1953  . VAGINAL HYSTERECTOMY     2001  . VOCAL CORD LATERALIZATION, ENDOSCOPIC APPROACH W/ MLB     2007 (@[facility] )  . WRIST SURGERY     left side/2008  . WRIST SURGERY     right side/ 2002    There were no vitals filed for this visit.  Subjective Assessment - 10/05/17 1323    Subjective  No new complaints. No falls.     Limitations  Lifting;Walking;Standing;House hold activities    Patient Stated Goals  Walk & stand without significant pain.     Currently in Pain?  Yes    Pain Score  2     Pain Location  Back    Pain Orientation  Mid;Lower    Pain  Descriptors / Indicators  Aching    Pain Type  Chronic pain    Pain Onset  More than a month ago    Pain Frequency  Constant    Aggravating Factors   standing for prolonged times    Pain Relieving Factors  lying down and performing strech         [facility] Adult PT Treatment/Exercise - 10/05/17 1326      Transfers   Transfers  Sit to Stand;Stand to Sit    Sit to Stand  5: Supervision    Stand to Sit  5: Supervision      Ambulation/Gait   Ambulation/Gait  Yes    Ambulation/Gait Assistance  5: Supervision;4: Min guard    Ambulation/Gait Assistance Details  cues for increaesed step length and increased step height for heel>toe step progression     Ambulation Distance (Feet)  500 Feet    Assistive device  None    Gait Pattern  Step-through pattern;Decreased stance time - left;Decreased arm swing - right;Decreased step length - right;Decreased stride length;Decreased dorsiflexion - left;Decreased weight shift to left;Antalgic;Trunk flexed    Ambulation Surface  Level;Unlevel;Indoor;Outdoor;Paved;Gravel;Grass      High Level Balance   High Level Balance Activities  Marching forwards;Marching backwards;Tandem walking;Side stepping;Braiding tandem fwd/bwd    High Level Balance Comments  on red mats next to counter top with intermittent to light UE touch to counter top: 3 laps each with min guard to min assist for balance. cues on posture and ex form/technique                                  Balance Exercises - 10/05/17 1357      Balance Exercises: Standing   Rockerboard  Lateral;Anterior/posterior;Head turns;EC;30 seconds;10 reps;Intermittent UE support      Balance Exercises: Standing   Rebounder Limitations  performed both ways on balance board with intermittent touch to bars for balance: EO rocking board with emphasis on tall posture; holding board steady- EC no head movements, progressing to EC head movements left<>right and up<>down. min guard to min assist for balance with cues on  posture, weight shifting and foot position on board for balance assistance.           PT Short Term Goals - 09/15/17 2003      PT SHORT TERM GOAL #1   Title  Patient demostrates understanding of updated HEP. (All STGs Target Date 10/22/2017)    Time  4    Period  Weeks    Status  New    Target Date  10/22/17      PT SHORT TERM GOAL #2   Title  Patient ambulates 500' without device with supervision.     Time  4    Period  Weeks    Status  New    Target Date  10/22/17      PT SHORT TERM GOAL #3   Title  Functional Gait Assessment >/= 16/30    Time  4    Period  Weeks    Status  New    Target Date  10/22/17        PT Long Term Goals - 09/15/17 2010      PT LONG TERM GOAL #1   Title  Patient verbalizes & demonstrates understanding of ongoing fitness plan. (All LTGs Target Date: 11/19/2017)    Time  8    Period  Weeks    Status  On-going    Target Date  11/19/17      PT LONG TERM GOAL #2   Title  Patient reports pain increases </= 2 increments on 0-10 scale with standing & gait activities.     Time  2    Period  Months    Status  On-going    Target Date  11/19/17      PT LONG TERM GOAL #3   Title  Berg Balance Test >/= 52/56    Time  2    Period  Months    Status  Revised    Target Date  11/19/17      PT LONG TERM GOAL #4   Title  Functional Gait Assessment >/= 19/30    Time  2    Period  Months    Status  New    Target Date  11/19/17      PT LONG TERM GOAL #5   Title  6 Minute Walk Test >900' modified independent    Time  8    Period  Weeks    Status  New  Target Date  11/19/17      PT LONG TERM GOAL #6   Title  Patient negotiates ramps, curbs & stairs with LRAD modified independent for community access.     Time  2    Period  Months    Status  On-going    Target Date  11/19/17      PT LONG TERM GOAL #7   Title  Activities of Balance Confidence increases to >/= 45%    Baseline  --    Time  2    Period  Months    Status  On-going    Target  Date  11/19/17        Plan - 10/05/17 1325    Clinical Impression Statement  Today's skilled session continued to focus on high level balance and gait on various surfaces. Pt continues to need occasional cues for toe clearance. Pt is progressing toward goals and should benefit from continued PT to progress toward unmet goals.     Clinical Impairments Affecting Rehab Potential  HTN, depressive disorder, osteopenia, bil wrist fx's with falls left '08 & right '02, idiopathic thoracic scoliosis, LBP,     PT Frequency  2x / week    PT Duration  8 weeks    PT Treatment/Interventions  ADLs/Self Care Home Management;DME Instruction;Gait training;Moist Heat;Ultrasound;Canalith Repostioning;Stair training;Functional mobility training;Cryotherapy;Therapeutic activities;Therapeutic exercise;Balance training;Neuromuscular re-education;Patient/family education;Orthotic Fit/Training;Vestibular    PT Next Visit Plan  progressive balance & gait activities including compliant surfaces.    Consulted and Agree with Plan of Care  Patient       Patient will benefit from skilled therapeutic intervention in order to improve the following deficits and impairments:  Abnormal gait, Decreased activity tolerance, Decreased balance, Decreased endurance, Decreased knowledge of use of DME, Decreased mobility, Decreased range of motion, Decreased strength, Postural dysfunction, Pain, Impaired flexibility  Visit Diagnosis: Unsteadiness on feet  Other abnormalities of gait and mobility  Muscle weakness (generalized)  Dizziness and giddiness     Problem List Patient Active Problem List   Diagnosis Date Noted  . Symptomatic anemia 04/20/2016  . NSAID long-term use 04/20/2016  . Scoliosis 04/20/2016  . Depression 04/20/2016  . HTN (hypertension) 04/20/2016  . GERD (gastroesophageal reflux disease) 04/20/2016  . Occult GI bleeding 04/20/2016    Willow Ora, PTA, [facility] [address] [place], [place] [address] [phone] 10/06/17, 1:48 PM   Name: AARIA HAPP MRN: [id number] Date of Birth: [birth year]

## 2017-10-07 ENCOUNTER — Encounter: Payer: Self-pay | Admitting: Physical Therapy

## 2017-10-07 ENCOUNTER — Ambulatory Visit: Payer: Medicare Other | Admitting: Physical Therapy

## 2017-10-07 DIAGNOSIS — M6281 Muscle weakness (generalized): Secondary | ICD-10-CM

## 2017-10-07 DIAGNOSIS — R2689 Other abnormalities of gait and mobility: Secondary | ICD-10-CM

## 2017-10-07 DIAGNOSIS — R293 Abnormal posture: Secondary | ICD-10-CM

## 2017-10-07 DIAGNOSIS — R2681 Unsteadiness on feet: Secondary | ICD-10-CM | POA: Diagnosis not present

## 2017-10-07 NOTE — Therapy (Signed)
[facility] [address] [place], [place], [address] Phone: [phone]   Fax:  [phone]  Physical Therapy Treatment  Patient Details  Name: Elaine Rivera MRN: [id number] Date of Birth: 07/20/1946 Referring Provider: Carol Ada, MD   Encounter Date: 10/07/2017  PT End of Session - 10/07/17 2209    Visit Number  16    Number of Visits  28    Date for PT Re-Evaluation  11/19/17    Authorization Type  [employer]HC Medicare     PT Start Time  1607    PT Stop Time  1400    PT Time Calculation (min)  43 min    Equipment Utilized During Treatment  Gait belt    Activity Tolerance  Patient tolerated treatment well    Behavior During Therapy  [facility] for tasks assessed/performed       Past Medical History:  Diagnosis Date  . Basal cell carcinoma of eye    biopsy of left eye/ non cancerous  . Complication of anesthesia    nausea/vomiting  . Depression   . Hypertension   . Kidney stones    twice/ [place]    Past Surgical History:  Procedure Laterality Date  . CEREBRAL ANEURYSM REPAIR     1998  . ECTOPIC PREGNANCY SURGERY     x2   . ESOPHAGOGASTRODUODENOSCOPY N/A 04/22/2016   Procedure: ESOPHAGOGASTRODUODENOSCOPY (EGD);  Surgeon: Juanita Craver, MD;  Location: [facility];  Service: Endoscopy;  Laterality: N/A;  . PARATHYROIDECTOMY     2002  . TONSILLECTOMY     1953  . VAGINAL HYSTERECTOMY     2001  . VOCAL CORD LATERALIZATION, ENDOSCOPIC APPROACH W/ MLB     2007 (@[facility] )  . WRIST SURGERY     left side/2008  . WRIST SURGERY     right side/ 2002    There were no vitals filed for this visit.  Subjective Assessment - 10/07/17 1319    Subjective  No falls. She has been talking to a friend who may help her start going to [facility]     Pertinent History  HTN, depressive disorder, osteopenia, bil wrist fx's with falls left '08 & right '02, idiopathic thoracic scoliosis, LBP; has been a while since she has had a bone  density assessment    Limitations  Lifting;Walking;Standing;House hold activities    Patient Stated Goals  Walk & stand without significant pain.     Currently in Pain?  Yes    Pain Score  2     Pain Location  Back    Pain Orientation  Mid;Lower    Pain Descriptors / Indicators  Aching    Pain Type  Chronic pain    Pain Onset  More than a month ago    Pain Frequency  Constant    Aggravating Factors   standing for prolonged times, walking long distances    Pain Relieving Factors  lying down and performing stretch                      [facility] Adult PT Treatment/Exercise - 10/07/17 0001      Knee/Hip Exercises: Aerobic   Nustep  Level 3 with BUEs & BLEs 6 min with cues on pace & full ROM          Balance Exercises - 10/07/17 1315      Balance Exercises: Standing   Stepping Strategy  Anterior;Posterior;Foam/compliant surface;5 reps;UE support minA for balance recovery  Rockerboard  Lateral;Anterior/posterior;Head turns;EC;30 seconds;10 reps;Intermittent UE support weight shifts with balance recovery.     Balance Beam  Foam beam: standing crossways head turns EO right/left & up/down, alternate stepping forward & backward.     Gait with Head Turns  Forward head turns right/left, up/down & diagonals w/ MinA/tactile     Retro Gait  5 reps tactile cues    Sidestepping  5 reps tactile cues      Balance Exercises: Standing   Rebounder Limitations  performed both ways on balance board with intermittent touch to bars for balance: EO rocking board with emphasis on tall posture; holding board steady- EC no head movements, progressing to EC head movements left<>right and up<>down. min guard to min assist for balance with cues on posture, weight shifting and foot position on board for balance assistance.           PT Short Term Goals - 09/15/17 2003      PT SHORT TERM GOAL #1   Title  Patient demostrates understanding of updated HEP. (All STGs Target Date 10/22/2017)     Time  4    Period  Weeks    Status  New    Target Date  10/22/17      PT SHORT TERM GOAL #2   Title  Patient ambulates 500' without device with supervision.     Time  4    Period  Weeks    Status  New    Target Date  10/22/17      PT SHORT TERM GOAL #3   Title  Functional Gait Assessment >/= 16/30    Time  4    Period  Weeks    Status  New    Target Date  10/22/17        PT Long Term Goals - 09/15/17 2010      PT LONG TERM GOAL #1   Title  Patient verbalizes & demonstrates understanding of ongoing fitness plan. (All LTGs Target Date: 11/19/2017)    Time  8    Period  Weeks    Status  On-going    Target Date  11/19/17      PT LONG TERM GOAL #2   Title  Patient reports pain increases </= 2 increments on 0-10 scale with standing & gait activities.     Time  2    Period  Months    Status  On-going    Target Date  11/19/17      PT LONG TERM GOAL #3   Title  Berg Balance Test >/= 52/56    Time  2    Period  Months    Status  Revised    Target Date  11/19/17      PT LONG TERM GOAL #4   Title  Functional Gait Assessment >/= 19/30    Time  2    Period  Months    Status  New    Target Date  11/19/17      PT LONG TERM GOAL #5   Title  6 Minute Walk Test >900' modified independent    Time  8    Period  Weeks    Status  New    Target Date  11/19/17      PT LONG TERM GOAL #6   Title  Patient negotiates ramps, curbs & stairs with LRAD modified independent for community access.     Time  2    Period  Months  Status  On-going    Target Date  11/19/17      PT LONG TERM GOAL #7   Title  Activities of Balance Confidence increases to >/= 45%    Baseline  --    Time  2    Period  Months    Status  On-going    Target Date  11/19/17            Plan - 10/07/17 2209    Clinical Impression Statement  Patient improved ankle & hip strategies with skilled instruction / activities. Step strategy was delayed and pt required assist to prevent falls. Turning head to  scan increased her shuffling gait pattern.     Clinical Impairments Affecting Rehab Potential  HTN, depressive disorder, osteopenia, bil wrist fx's with falls left '08 & right '02, idiopathic thoracic scoliosis, LBP,     PT Frequency  2x / week    PT Duration  8 weeks    PT Treatment/Interventions  ADLs/Self Care Home Management;DME Instruction;Gait training;Moist Heat;Ultrasound;Canalith Repostioning;Stair training;Functional mobility training;Cryotherapy;Therapeutic activities;Therapeutic exercise;Balance training;Neuromuscular re-education;Patient/family education;Orthotic Fit/Training;Vestibular    PT Next Visit Plan  progressive balance & gait activities including compliant surfaces.    Consulted and Agree with Plan of Care  Patient       Patient will benefit from skilled therapeutic intervention in order to improve the following deficits and impairments:  Abnormal gait, Decreased activity tolerance, Decreased balance, Decreased endurance, Decreased knowledge of use of DME, Decreased mobility, Decreased range of motion, Decreased strength, Postural dysfunction, Pain, Impaired flexibility  Visit Diagnosis: Unsteadiness on feet  Other abnormalities of gait and mobility  Muscle weakness (generalized)  Abnormal posture     Problem List Patient Active Problem List   Diagnosis Date Noted  . Symptomatic anemia 04/20/2016  . NSAID long-term use 04/20/2016  . Scoliosis 04/20/2016  . Depression 04/20/2016  . HTN (hypertension) 04/20/2016  . GERD (gastroesophageal reflux disease) 04/20/2016  . Occult GI bleeding 04/20/2016    Eliel Dudding PT, DPT 10/07/2017, 10:22 PM  [facility] [address] [place], [place], [address] Phone: [phone]   Fax:  [phone]  Name: Elaine Rivera MRN: [id number] Date of Birth: 09-01-1946

## 2017-10-11 ENCOUNTER — Encounter: Payer: Self-pay | Admitting: Physical Therapy

## 2017-10-11 ENCOUNTER — Ambulatory Visit: Payer: Medicare Other | Admitting: Physical Therapy

## 2017-10-11 DIAGNOSIS — R2689 Other abnormalities of gait and mobility: Secondary | ICD-10-CM

## 2017-10-11 DIAGNOSIS — R293 Abnormal posture: Secondary | ICD-10-CM

## 2017-10-11 DIAGNOSIS — M6281 Muscle weakness (generalized): Secondary | ICD-10-CM

## 2017-10-11 DIAGNOSIS — R2681 Unsteadiness on feet: Secondary | ICD-10-CM | POA: Diagnosis not present
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
## 2017-10-12 ENCOUNTER — Ambulatory Visit: Payer: Medicare Other | Admitting: Neurology

## 2017-10-13 ENCOUNTER — Encounter: Payer: Self-pay | Admitting: Physical Therapy

## 2017-10-13 ENCOUNTER — Ambulatory Visit: Payer: Medicare Other | Admitting: Physical Therapy

## 2017-10-13 DIAGNOSIS — R42 Dizziness and giddiness: Secondary | ICD-10-CM

## 2017-10-13 DIAGNOSIS — R2681 Unsteadiness on feet: Secondary | ICD-10-CM

## 2017-10-13 DIAGNOSIS — R293 Abnormal posture: Secondary | ICD-10-CM

## 2017-10-13 DIAGNOSIS — M6281 Muscle weakness (generalized): Secondary | ICD-10-CM

## 2017-10-13 DIAGNOSIS — R2689 Other abnormalities of gait and mobility: Secondary | ICD-10-CM

## 2017-10-13 NOTE — Therapy (Signed)
[facility] [address] [place], [place], [address] Phone: [phone]   Fax:  [phone]  Physical Therapy Treatment  Patient Details  Name: Elaine Rivera MRN: [id number] Date of Birth: [birth year] Referring Provider: Carol Ada, MD   Encounter Date: 10/13/2017  PT End of Session - 10/13/17 1530    Visit Number  18    Number of Visits  28    Date for PT Re-Evaluation  11/19/17    Authorization Type  [employer] Medicare     PT Start Time  1405    PT Stop Time  1450    PT Time Calculation (min)  45 min    Equipment Utilized During Treatment  Gait belt    Activity Tolerance  Patient tolerated treatment well    Behavior During Therapy  [facility] for tasks assessed/performed       Past Medical History:  Diagnosis Date  . Basal cell carcinoma of eye    biopsy of left eye/ non cancerous  . Complication of anesthesia    nausea/vomiting  . Depression   . Hypertension   . Kidney stones    twice/ [place]    Past Surgical History:  Procedure Laterality Date  . CEREBRAL ANEURYSM REPAIR     1998  . ECTOPIC PREGNANCY SURGERY     x2   . ESOPHAGOGASTRODUODENOSCOPY N/A 04/22/2016   Procedure: ESOPHAGOGASTRODUODENOSCOPY (EGD);  Surgeon: Juanita Craver, MD;  Location: [facility];  Service: Endoscopy;  Laterality: N/A;  . PARATHYROIDECTOMY     2002  . TONSILLECTOMY     1953  . VAGINAL HYSTERECTOMY     2001  . VOCAL CORD LATERALIZATION, ENDOSCOPIC APPROACH W/ MLB     2007 (@[facility] )  . WRIST SURGERY     left side/2008  . WRIST SURGERY     right side/ 2002    There were no vitals filed for this visit.  Subjective Assessment - 10/13/17 1408    Subjective  No falls. She was wobbly this morning but not sure why (not light-headed).     Pertinent History  HTN, depressive disorder, osteopenia, bil wrist fx's with falls left '08 & right '02, idiopathic thoracic scoliosis, LBP; has been a while since she has had a bone density  assessment    Limitations  Lifting;Walking;Standing;House hold activities    Patient Stated Goals  Walk & stand without significant pain.     Currently in Pain?  Yes    Pain Score  4     Pain Location  Back    Pain Orientation  Mid    Pain Descriptors / Indicators  Aching    Pain Onset  More than a month ago    Pain Frequency  Constant    Aggravating Factors   weather is increasing today    Pain Relieving Factors  lying down and perfroming stretch    Effect of Pain on Daily Activities  limits standing & walking                      [facility] Adult PT Treatment/Exercise - 10/13/17 1445      Knee/Hip Exercises: Aerobic   Nustep  Level 3 with BUEs & BLEs 5 min with cues on pace & full ROM          Balance Exercises - 10/13/17 1400      Balance Exercises: Standing   Standing, One Foot on a Step  Eyes open;2 inch;Other reps (comment);4  inch foot on 2" ball rolling circles, on 4" foam roll A/P & later    Stepping Strategy  Anterior;Posterior;5 reps standing on ramp uphill & downhill alt. stepping, PT challen    Rockerboard  Lateral;Anterior/posterior;Head turns;EC;30 seconds;10 reps;Intermittent UE support weight shifts with balance recovery.     Gait with Head Turns  Forward head turns right/left, up/down & diagonals w/ MinA/tactile     Retro Gait  5 reps tactile cues    Sidestepping  5 reps tactile cues      Balance Exercises: Standing   Rebounder Limitations  performed both ways on balance board with intermittent touch to bars for balance: EO rocking board with emphasis on tall posture; holding board steady- EC no head movements, progressing to EC head movements left<>right and up<>down. min guard to min assist for balance with cues on posture, weight shifting and foot position on board for balance assistance.           PT Short Term Goals - 09/15/17 2003      PT SHORT TERM GOAL #1   Title  Patient demostrates understanding of updated HEP. (All STGs Target Date  10/22/2017)    Time  4    Period  Weeks    Status  New    Target Date  10/22/17      PT SHORT TERM GOAL #2   Title  Patient ambulates 500' without device with supervision.     Time  4    Period  Weeks    Status  New    Target Date  10/22/17      PT SHORT TERM GOAL #3   Title  Functional Gait Assessment >/= 16/30    Time  4    Period  Weeks    Status  New    Target Date  10/22/17        PT Long Term Goals - 09/15/17 2010      PT LONG TERM GOAL #1   Title  Patient verbalizes & demonstrates understanding of ongoing fitness plan. (All LTGs Target Date: 11/19/2017)    Time  8    Period  Weeks    Status  On-going    Target Date  11/19/17      PT LONG TERM GOAL #2   Title  Patient reports pain increases </= 2 increments on 0-10 scale with standing & gait activities.     Time  2    Period  Months    Status  On-going    Target Date  11/19/17      PT LONG TERM GOAL #3   Title  Berg Balance Test >/= 52/56    Time  2    Period  Months    Status  Revised    Target Date  11/19/17      PT LONG TERM GOAL #4   Title  Functional Gait Assessment >/= 19/30    Time  2    Period  Months    Status  New    Target Date  11/19/17      PT LONG TERM GOAL #5   Title  6 Minute Walk Test >900' modified independent    Time  8    Period  Weeks    Status  New    Target Date  11/19/17      PT LONG TERM GOAL #6   Title  Patient negotiates ramps, curbs & stairs with LRAD modified independent for community access.  Time  2    Period  Months    Status  On-going    Target Date  11/19/17      PT LONG TERM GOAL #7   Title  Activities of Balance Confidence increases to >/= 45%    Baseline  --    Time  2    Period  Months    Status  On-going    Target Date  11/19/17            Plan - 10/13/17 1915    Clinical Impression Statement  Patient had improved step strategy with alternate stepping while standing on ramp & PT manually challenging balance to elicit balance loss. Pt had  less fatigue with standing activities today.     Clinical Impairments Affecting Rehab Potential  HTN, depressive disorder, osteopenia, bil wrist fx's with falls left '08 & right '02, idiopathic thoracic scoliosis, LBP,     PT Frequency  2x / week    PT Duration  8 weeks    PT Treatment/Interventions  ADLs/Self Care Home Management;DME Instruction;Gait training;Moist Heat;Ultrasound;Canalith Repostioning;Stair training;Functional mobility training;Cryotherapy;Therapeutic activities;Therapeutic exercise;Balance training;Neuromuscular re-education;Patient/family education;Orthotic Fit/Training;Vestibular    PT Next Visit Plan  check STGs next week, progressive balance & gait activities including compliant surfaces.    Consulted and Agree with Plan of Care  Patient       Patient will benefit from skilled therapeutic intervention in order to improve the following deficits and impairments:  Abnormal gait, Decreased activity tolerance, Decreased balance, Decreased endurance, Decreased knowledge of use of DME, Decreased mobility, Decreased range of motion, Decreased strength, Postural dysfunction, Pain, Impaired flexibility  Visit Diagnosis: Unsteadiness on feet  Muscle weakness (generalized)  Other abnormalities of gait and mobility  Abnormal posture  Dizziness and giddiness     Problem List Patient Active Problem List   Diagnosis Date Noted  . Symptomatic anemia 04/20/2016  . NSAID long-term use 04/20/2016  . Scoliosis 04/20/2016  . Depression 04/20/2016  . HTN (hypertension) 04/20/2016  . GERD (gastroesophageal reflux disease) 04/20/2016  . Occult GI bleeding 04/20/2016    Dhruvan Gullion PT, DPT 10/13/2017, 7:17 PM  [facility] [address] [place], [place], [address] Phone: [phone]   Fax:  [phone]  Name: Elaine Rivera MRN: [id number] Date of Birth: Mar 30, 1946

## 2017-10-18 ENCOUNTER — Encounter: Payer: Self-pay | Admitting: Physical Therapy

## 2017-10-18 ENCOUNTER — Ambulatory Visit: Payer: Medicare Other | Admitting: Physical Therapy

## 2017-10-18 DIAGNOSIS — M6281 Muscle weakness (generalized): Secondary | ICD-10-CM

## 2017-10-18 DIAGNOSIS — R42 Dizziness and giddiness: Secondary | ICD-10-CM

## 2017-10-18 DIAGNOSIS — R2681 Unsteadiness on feet: Secondary | ICD-10-CM

## 2017-10-18 DIAGNOSIS — R2689 Other abnormalities of gait and mobility: Secondary | ICD-10-CM

## 2017-10-18 DIAGNOSIS — R293 Abnormal posture: Secondary | ICD-10-CM

## 2017-10-19 NOTE — Therapy (Signed)
[facility] [address] [place], [place], [address] Phone: [phone]   Fax:  [phone]  Physical Therapy Treatment  Patient Details  Name: Elaine Rivera MRN: [id number] Date of Birth: [birth year] Referring Provider: Carol Ada, MD   Encounter Date: 10/18/2017  PT End of Session - 10/18/17 1854    Visit Number  19    Number of Visits  28    Date for PT Re-Evaluation  11/19/17    Authorization Type  [employer] Medicare progress noted every 10th visit    PT Start Time  1402    PT Stop Time  1444    PT Time Calculation (min)  42 min    Equipment Utilized During Treatment  Gait belt    Activity Tolerance  Patient tolerated treatment well    Behavior During Therapy  [facility] for tasks assessed/performed       Past Medical History:  Diagnosis Date  . Basal cell carcinoma of eye    biopsy of left eye/ non cancerous  . Complication of anesthesia    nausea/vomiting  . Depression   . Hypertension   . Kidney stones    twice/ [place]    Past Surgical History:  Procedure Laterality Date  . CEREBRAL ANEURYSM REPAIR     1998  . ECTOPIC PREGNANCY SURGERY     x2   . ESOPHAGOGASTRODUODENOSCOPY N/A 04/22/2016   Procedure: ESOPHAGOGASTRODUODENOSCOPY (EGD);  Surgeon: Juanita Craver, MD;  Location: [facility];  Service: Endoscopy;  Laterality: N/A;  . PARATHYROIDECTOMY     2002  . TONSILLECTOMY     1953  . VAGINAL HYSTERECTOMY     2001  . VOCAL CORD LATERALIZATION, ENDOSCOPIC APPROACH W/ MLB     2007 (_[facility] )  . WRIST SURGERY     left side/2008  . WRIST SURGERY     right side/ 2002    There were no vitals filed for this visit.  Subjective Assessment - 10/18/17 1403    Subjective  No falls. She feels wobbly again today.     Pertinent History  HTN, depressive disorder, osteopenia, bil wrist fx's with falls left '08 & right '02, idiopathic thoracic scoliosis, LBP; has been a while since she has had a bone density  assessment    Limitations  Lifting;Walking;Standing;House hold activities    Patient Stated Goals  Walk & stand without significant pain.     Currently in Pain?  Yes    Pain Score  3     Pain Location  Back    Pain Orientation  Mid    Pain Descriptors / Indicators  Aching    Pain Type  Chronic pain    Pain Onset  More than a month ago    Pain Frequency  Constant    Aggravating Factors   weather is increasing today    Pain Relieving Factors  lying down and performing stretches    Effect of Pain on Daily Activities  limits standing & walking         [facility] PT Assessment - 10/18/17 1400      Ambulation/Gait   Gait velocity  3.14 ft/sec comfortable, 3.79 ft/sec 2.22 ft/sec       Functional Gait  Assessment   Gait assessed   Yes    Gait Level Surface  Walks 20 ft in less than 7 sec but greater than 5.5 sec, uses assistive device, slower speed, mild gait deviations, or deviates 6-10 in outside of the 12  in walkway width.    Change in Gait Speed  Able to change speed, demonstrates mild gait deviations, deviates 6-10 in outside of the 12 in walkway width, or no gait deviations, unable to achieve a major change in velocity, or uses a change in velocity, or uses an assistive device.    Gait with Horizontal Head Turns  Performs head turns smoothly with slight change in gait velocity (eg, minor disruption to smooth gait path), deviates 6-10 in outside 12 in walkway width, or uses an assistive device.    Gait with Vertical Head Turns  Performs task with slight change in gait velocity (eg, minor disruption to smooth gait path), deviates 6 - 10 in outside 12 in walkway width or uses assistive device    Gait and Pivot Turn  Turns slowly, requires verbal cueing, or requires several small steps to catch balance following turn and stop    Step Over Obstacle  Is able to step over one shoe box (4.5 in total height) but must slow down and adjust steps to clear box safely. May require verbal cueing.    Gait  with Narrow Base of Support  Ambulates 4-7 steps.    Gait with Eyes Closed  Walks 20 ft, slow speed, abnormal gait pattern, evidence for imbalance, deviates 10-15 in outside 12 in walkway width. Requires more than 9 sec to ambulate 20 ft.    Ambulating Backwards  Walks 20 ft, slow speed, abnormal gait pattern, evidence for imbalance, deviates 10-15 in outside 12 in walkway width.    Steps  Alternating feet, must use rail.    Total Score  15    FGA comment:  09/15/2017 was 12/30                  [facility] Adult PT Treatment/Exercise - 10/18/17 1400      Ambulation/Gait   Ambulation/Gait  Yes    Ambulation/Gait Assistance  5: Supervision    Ambulation/Gait Assistance Details  Gait at end of session to assess fatigue. Patient had minimal shuffling present with bilateral foot clearance & no loss of balance.     Ambulation Distance (Feet)  500 Feet    Assistive device  None    Ambulation Surface  Indoor;Level      High Level Balance   High Level Balance Activities  Side stepping;Braiding;Backward walking;Turns;Tandem walking    High Level Balance Comments  at counter with PT verbal cues on posture & balance reactions with supervision for safety.       Knee/Hip Exercises: Aerobic   Nustep  Level 3 with BUEs & BLEs 8 minutes with verbal cues on full range & pacing             PT Education - 10/18/17 1410    Education provided  Yes    Education Details  PT reviewed orthostatic hypotension with need to stay hydrated and discuss any medication changes with MD & pharmacist as to side effects regarding Orthostatic Hypotension    Person(s) Educated  Patient    Methods  Explanation;Verbal cues    Comprehension  Verbalized understanding;Need further instruction       PT Short Term Goals - 10/18/17 1855      PT SHORT TERM GOAL #1   Title  Patient demostrates understanding of updated HEP. (All STGs Target Date 10/22/2017)    Baseline  MET 10/18/2017    Time  4    Period  Weeks     Status  Achieved  PT SHORT TERM GOAL #2   Title  Patient ambulates 500' without device with supervision.     Baseline  MET 10/18/2017    Time  4    Period  Weeks    Status  Achieved      PT SHORT TERM GOAL #3   Title  Functional Gait Assessment >/= 16/30    Baseline  Partially MET 10/18/2017 FGA improved from 13/30 to 15/30    Time  4    Period  Weeks    Status  Partially Met        PT Long Term Goals - 09/15/17 2010      PT LONG TERM GOAL #1   Title  Patient verbalizes & demonstrates understanding of ongoing fitness plan. (All LTGs Target Date: 11/19/2017)    Time  8    Period  Weeks    Status  On-going    Target Date  11/19/17      PT LONG TERM GOAL #2   Title  Patient reports pain increases </= 2 increments on 0-10 scale with standing & gait activities.     Time  2    Period  Months    Status  On-going    Target Date  11/19/17      PT LONG TERM GOAL #3   Title  Berg Balance Test >/= 52/56    Time  2    Period  Months    Status  Revised    Target Date  11/19/17      PT LONG TERM GOAL #4   Title  Functional Gait Assessment >/= 19/30    Time  2    Period  Months    Status  New    Target Date  11/19/17      PT LONG TERM GOAL #5   Title  6 Minute Walk Test >900' modified independent    Time  8    Period  Weeks    Status  New    Target Date  11/19/17      PT LONG TERM GOAL #6   Title  Patient negotiates ramps, curbs & stairs with LRAD modified independent for community access.     Time  2    Period  Months    Status  On-going    Target Date  11/19/17      PT LONG TERM GOAL #7   Title  Activities of Balance Confidence increases to >/= 45%    Baseline  --    Time  2    Period  Months    Status  On-going    Target Date  11/19/17            Plan - 10/18/17 1856    Clinical Impression Statement  Patient met or partially met all STG set for this 30 day period. She has improved endurance noted with ability to perform FGA, NuStep 8 minutes,  standing balance at counter and 500' gait at end of session with minimal shuffling noted.     Clinical Impairments Affecting Rehab Potential  HTN, depressive disorder, osteopenia, bil wrist fx's with falls left '08 & right '02, idiopathic thoracic scoliosis, LBP,     PT Frequency  2x / week    PT Duration  8 weeks    PT Treatment/Interventions  ADLs/Self Care Home Management;DME Instruction;Gait training;Moist Heat;Ultrasound;Canalith Repostioning;Stair training;Functional mobility training;Cryotherapy;Therapeutic activities;Therapeutic exercise;Balance training;Neuromuscular re-education;Patient/family education;Orthotic Fit/Training;Vestibular    PT Next Visit Plan  send progress note   to MD, progressive balance & gait activities including compliant surfaces.    Consulted and Agree with Plan of Care  Patient       Patient will benefit from skilled therapeutic intervention in order to improve the following deficits and impairments:  Abnormal gait, Decreased activity tolerance, Decreased balance, Decreased endurance, Decreased knowledge of use of DME, Decreased mobility, Decreased range of motion, Decreased strength, Postural dysfunction, Pain, Impaired flexibility  Visit Diagnosis: Unsteadiness on feet  Muscle weakness (generalized)  Other abnormalities of gait and mobility  Abnormal posture  Dizziness and giddiness     Problem List Patient Active Problem List   Diagnosis Date Noted  . Symptomatic anemia 04/20/2016  . NSAID long-term use 04/20/2016  . Scoliosis 04/20/2016  . Depression 04/20/2016  . HTN (hypertension) 04/20/2016  . GERD (gastroesophageal reflux disease) 04/20/2016  . Occult GI bleeding 04/20/2016    , PT, DPT 10/19/2017, 9:01 AM  [facility] [address] [place], [place], [address] Phone: [phone]   Fax:  [phone]  Name: Dandra M Hall MRN: [id number] Date of Birth:  04/18/1946   

## 2017-10-20 ENCOUNTER — Ambulatory Visit: Payer: Medicare Other | Admitting: Physical Therapy

## 2017-10-25 ENCOUNTER — Encounter: Payer: Self-pay | Admitting: Physical Therapy

## 2017-10-25 ENCOUNTER — Ambulatory Visit: Payer: Medicare Other | Admitting: Physical Therapy

## 2017-10-25 DIAGNOSIS — R2681 Unsteadiness on feet: Secondary | ICD-10-CM

## 2017-10-25 DIAGNOSIS — R296 Repeated falls: Secondary | ICD-10-CM

## 2017-10-25 DIAGNOSIS — R293 Abnormal posture: Secondary | ICD-10-CM

## 2017-10-25 DIAGNOSIS — M6281 Muscle weakness (generalized): Secondary | ICD-10-CM

## 2017-10-25 DIAGNOSIS — R2689 Other abnormalities of gait and mobility: Secondary | ICD-10-CM

## 2017-10-25 NOTE — Therapy (Signed)
[facility] [address] [place], [place], [address] Phone: [phone]   Fax:  [phone]  Physical Therapy Treatment  Patient Details  Name: Elaine Rivera MRN: [id number] Date of Birth: 02/09/1946 Referring Provider: Candace Smith, MD   Encounter Date: 10/25/2017  PT End of Session - 10/25/17 1520    Visit Number  20    Number of Visits  28    Date for PT Re-Evaluation  11/19/17    Authorization Type  [employer] Medicare    PT Start Time  1400    PT Stop Time  1445    PT Time Calculation (min)  45 min    Equipment Utilized During Treatment  Gait belt    Activity Tolerance  Patient tolerated treatment well    Behavior During Therapy  WFL for tasks assessed/performed       Past Medical History:  Diagnosis Date  . Basal cell carcinoma of eye    biopsy of left eye/ non cancerous  . Complication of anesthesia    nausea/vomiting  . Depression   . Hypertension   . Kidney stones    twice/ 1997 & 1998    Past Surgical History:  Procedure Laterality Date  . CEREBRAL ANEURYSM REPAIR     1998  . ECTOPIC PREGNANCY SURGERY     x2   . ESOPHAGOGASTRODUODENOSCOPY N/A 04/22/2016   Procedure: ESOPHAGOGASTRODUODENOSCOPY (EGD);  Surgeon: Jyothi Mann, MD;  Location: [facility];  Service: Endoscopy;  Laterality: N/A;  . PARATHYROIDECTOMY     2002  . TONSILLECTOMY     1953  . VAGINAL HYSTERECTOMY     2001  . VOCAL CORD LATERALIZATION, ENDOSCOPIC APPROACH W/ MLB     2007 (@[facility])  . WRIST SURGERY     left side/2008  . WRIST SURGERY     right side/ 2002    There were no vitals filed for this visit.  Subjective Assessment - 10/25/17 1400    Subjective  No falls. She has not been able to find a ride to [employer] yet.     Pertinent History  HTN, depressive disorder, osteopenia, bil wrist fx's with falls left '08 & right '02, idiopathic thoracic scoliosis, LBP; has been a while since she has had a bone density assessment    Limitations  Lifting;Walking;Standing;House hold activities    Patient Stated Goals  Walk & stand without significant pain.     Currently in Pain?  Yes    Pain Score  2     Pain Location  Back    Pain Orientation  Mid    Pain Descriptors / Indicators  Aching    Pain Type  Chronic pain    Pain Onset  More than a month ago    Pain Frequency  Constant    Aggravating Factors   weather    Pain Relieving Factors  lying down & performing stretches    Effect of Pain on Daily Activities  limits standing & walking    Multiple Pain Sites  No    Pain Location  Neck                      [facility] Adult PT Treatment/Exercise - 10/25/17 1400      Ambulation/Gait   Ambulation/Gait  Yes    Ambulation/Gait Assistance  5: Supervision    Ambulation/Gait Assistance Details  TC upright posture, step length and cadence     Ambulation Distance (Feet)  200 Feet      Assistive device  None    Gait Pattern  Step-through pattern;Decreased stance time - left;Decreased arm swing - right;Decreased step length - right;Decreased stride length;Decreased dorsiflexion - left;Decreased weight shift to left;Antalgic;Trunk flexed    Ambulation Surface  Indoor;Level    Stairs  Yes    Stairs Assistance  5: Supervision VC    Stair Management Technique  One rail Right;Alternating pattern;Forwards    Number of Stairs  4 3 reps      High Level Balance   High Level Balance Activities  Side stepping;Backward walking;Turns;Tandem walking;Marching forwards;Marching backwards    High Level Balance Comments  red mat surface TC/VC for correction balance reactions and dirction changes( mulitple direction), and marching;  incline surface back,fwd,and side steps to address step stratigy for balance training      Knee/Hip Exercises: Aerobic   Nustep  Level 3 with BUEs & BLEs 8 minutes.          Balance Exercises - 10/25/17 1400      Balance Exercises: Standing   Standing, One Foot on a Step  Eyes open;2 inch;Other  reps (comment);Foam/compliant surface step stratigy( fwd,bkwrd,lateral) R and L step, 5ea    Stepping Strategy  Anterior;Posterior;5 reps standing on ramp uphill & downhill alt. stepping, PT challen    Retro Gait  5 reps tactile cues    Sidestepping  5 reps TC/VC steps 10 ea          PT Short Term Goals - 10/18/17 1855      PT SHORT TERM GOAL #1   Title  Patient demostrates understanding of updated HEP. (All STGs Target Date 10/22/2017)    Baseline  MET 10/18/2017    Time  4    Period  Weeks    Status  Achieved      PT SHORT TERM GOAL #2   Title  Patient ambulates 500' without device with supervision.     Baseline  MET 10/18/2017    Time  4    Period  Weeks    Status  Achieved      PT SHORT TERM GOAL #3   Title  Functional Gait Assessment >/= 16/30    Baseline  Partially MET 10/18/2017 FGA improved from 13/30 to 15/30    Time  4    Period  Weeks    Status  Partially Met        PT Long Term Goals - 09/15/17 2010      PT LONG TERM GOAL #1   Title  Patient verbalizes & demonstrates understanding of ongoing fitness plan. (All LTGs Target Date: 11/19/2017)    Time  8    Period  Weeks    Status  On-going    Target Date  11/19/17      PT LONG TERM GOAL #2   Title  Patient reports pain increases </= 2 increments on 0-10 scale with standing & gait activities.     Time  2    Period  Months    Status  On-going    Target Date  11/19/17      PT LONG TERM GOAL #3   Title  Berg Balance Test >/= 52/56    Time  2    Period  Months    Status  Revised    Target Date  11/19/17      PT LONG TERM GOAL #4   Title  Functional Gait Assessment >/= 19/30    Time  2    Period  Months    Status  New    Target Date  11/19/17      PT LONG TERM GOAL #5   Title  6 Minute Walk Test >900' modified independent    Time  8    Period  Weeks    Status  New    Target Date  11/19/17      PT LONG TERM GOAL #6   Title  Patient negotiates ramps, curbs & stairs with LRAD modified independent  for community access.     Time  2    Period  Months    Status  On-going    Target Date  11/19/17      PT LONG TERM GOAL #7   Title  Activities of Balance Confidence increases to >/= 45%    Baseline  --    Time  2    Period  Months    Status  On-going    Target Date  11/19/17            Plan - 10/25/17 1523    Clinical Impression Statement  pt continues to progress with cadance and improveing step length. Pt maintained improved upright posture thought session. pt ended session without significant fatigue and improved stride length.     Clinical Impairments Affecting Rehab Potential  HTN, depressive disorder, osteopenia, bil wrist fx's with falls left '08 & right '02, idiopathic thoracic scoliosis, LBP,     PT Frequency  2x / week    PT Duration  8 weeks    PT Treatment/Interventions  ADLs/Self Care Home Management;DME Instruction;Gait training;Moist Heat;Ultrasound;Canalith Repostioning;Stair training;Functional mobility training;Cryotherapy;Therapeutic activities;Therapeutic exercise;Balance training;Neuromuscular re-education;Patient/family education;Orthotic Fit/Training;Vestibular    PT Next Visit Plan  work towards [place] on balance & gait with improving muscle endurance    Consulted and Agree with Plan of Care  Patient       Patient will benefit from skilled therapeutic intervention in order to improve the following deficits and impairments:  Abnormal gait, Decreased activity tolerance, Decreased balance, Decreased endurance, Decreased knowledge of use of DME, Decreased mobility, Decreased range of motion, Decreased strength, Postural dysfunction, Pain, Impaired flexibility  Visit Diagnosis: Unsteadiness on feet  Muscle weakness (generalized)  Other abnormalities of gait and mobility  Repeated falls  Abnormal posture     Problem List Patient Active Problem List   Diagnosis Date Noted  . Symptomatic anemia 04/20/2016  . NSAID long-term use 04/20/2016  .  Scoliosis 04/20/2016  . Depression 04/20/2016  . HTN (hypertension) 04/20/2016  . GERD (gastroesophageal reflux disease) 04/20/2016  . Occult GI bleeding 04/20/2016    Waunita Schooner, SPT 10/25/2017, 3:36 PM  [facility] [address] [place], [place], [address] Phone: [phone]   Fax:  [phone]  Name: ANYELY CUNNING MRN: [id number] Date of Birth: [birth year]

## 2017-10-27 ENCOUNTER — Encounter: Payer: Self-pay | Admitting: Physical Therapy

## 2017-10-27 ENCOUNTER — Ambulatory Visit: Payer: Medicare Other | Admitting: Physical Therapy

## 2017-10-27 DIAGNOSIS — R2681 Unsteadiness on feet: Secondary | ICD-10-CM

## 2017-10-27 DIAGNOSIS — R2689 Other abnormalities of gait and mobility: Secondary | ICD-10-CM

## 2017-10-27 DIAGNOSIS — M6281 Muscle weakness (generalized): Secondary | ICD-10-CM

## 2017-10-28 NOTE — Therapy (Signed)
[facility] [address] [place], [place], [address] Phone: [phone]   Fax:  [phone]  Physical Therapy Treatment  Patient Details  Name: Elaine Rivera MRN: [id number] Date of Birth: 03-27-1946 Referring Provider: Carol Ada, MD   Encounter Date: 10/27/2017   10/27/17 1409  PT Visits / Re-Eval  Visit Number 21  Number of Visits 28  Date for PT Re-Evaluation 11/19/17  Authorization  Authorization Type [employer] Medicare  PT Time Calculation  PT Start Time 9937  PT Stop Time 1445  PT Time Calculation (min) 41 min  PT - End of Session  Equipment Utilized During Treatment Gait belt  Activity Tolerance Patient tolerated treatment well  Behavior During Therapy [facility] for tasks assessed/performed     Past Medical History:  Diagnosis Date  . Basal cell carcinoma of eye    biopsy of left eye/ non cancerous  . Complication of anesthesia    nausea/vomiting  . Depression   . Hypertension   . Kidney stones    twice/ [place]    Past Surgical History:  Procedure Laterality Date  . CEREBRAL ANEURYSM REPAIR     1998  . ECTOPIC PREGNANCY SURGERY     x2   . ESOPHAGOGASTRODUODENOSCOPY N/A 04/22/2016   Procedure: ESOPHAGOGASTRODUODENOSCOPY (EGD);  Surgeon: Juanita Craver, MD;  Location: [facility];  Service: Endoscopy;  Laterality: N/A;  . PARATHYROIDECTOMY     2002  . TONSILLECTOMY     1953  . VAGINAL HYSTERECTOMY     2001  . VOCAL CORD LATERALIZATION, ENDOSCOPIC APPROACH W/ MLB     2007 ('@[facility]' )  . WRIST SURGERY     left side/2008  . WRIST SURGERY     right side/ 2002    There were no vitals filed for this visit.     10/27/17 1408  Symptoms/Limitations  Subjective No new complaints. No falls to report. Was sore after last session.  Pertinent History HTN, depressive disorder, osteopenia, bil wrist fx's with falls left '08 & right '02, idiopathic thoracic scoliosis, LBP; has been a while since she has  had a bone density assessment  Limitations Lifting;Walking;Standing;House hold activities  Patient Stated Goals Walk & stand without significant pain.   Pain Assessment  Currently in Pain? Yes  Pain Score 2  Pain Location Back  Pain Orientation Mid  Pain Descriptors / Indicators Aching  Pain Type Chronic pain  Pain Onset More than a month ago  Pain Frequency Constant  Aggravating Factors  cold/rainy weather  Pain Relieving Factors lying down and performing stretches      10/27/17 1412  Ambulation/Gait  Ambulation/Gait Yes  Ambulation/Gait Assistance 5: Supervision  Ambulation/Gait Assistance Details multimodal cues for more upright posture, head up (forward gaze) and for incr/equal step length    Ambulation Distance (Feet) 100 Feet (x2, plus around gym)  Assistive device None  Gait Pattern Step-through pattern;Decreased stance time - left;Decreased arm swing - right;Decreased step length - right;Decreased stride length;Decreased dorsiflexion - left;Decreased weight shift to left;Antalgic;Trunk flexed  Ambulation Surface Level;Indoor  High Level Balance  High Level Balance Activities Side stepping;Marching forwards;Marching backwards;Tandem walking (tandem gait fwd/bwd)  High Level Balance Comments on both red mats next to counter for balance assistance as needed: multiple laps with min guard to min assist for balance. multimodal cues for upright posture, weight shifting and ex form/techique.    Knee/Hip Exercises: Aerobic  Nustep UE/LE's on level 3.0 x 8 minutes with goal rpm >/= 30 steps per  minute for strengthening and activity tolerance        10/27/17 1420  Balance Exercises: Standing  SLS with Vectors Foam/compliant surface;Limitations  Step Over Hurdles / Cones hurdles of varied heights on both red mats: reciprocal stepping over with no UE support. mod assist downgrading to min assist for balance.   Balance Exercises: Standing  SLS with Vectors Limitations 6 cones along  edges of both red mats: toe taps to each with side stepping left<>right, then double toe taps to each with side stepping left<>right. no UE support with cues on stance, weight shifting and to slow down all for more improved balance. min guard to min assist for balance.         PT Short Term Goals - 10/18/17 1855      PT SHORT TERM GOAL #1   Title  Patient demostrates understanding of updated HEP. (All STGs Target Date 10/22/2017)    Baseline  MET 10/18/2017    Time  4    Period  Weeks    Status  Achieved      PT SHORT TERM GOAL #2   Title  Patient ambulates 500' without device with supervision.     Baseline  MET 10/18/2017    Time  4    Period  Weeks    Status  Achieved      PT SHORT TERM GOAL #3   Title  Functional Gait Assessment >/= 16/30    Baseline  Partially MET 10/18/2017 FGA improved from 13/30 to 15/30    Time  4    Period  Weeks    Status  Partially Met        PT Long Term Goals - 09/15/17 2010      PT LONG TERM GOAL #1   Title  Patient verbalizes & demonstrates understanding of ongoing fitness plan. (All LTGs Target Date: 11/19/2017)    Time  8    Period  Weeks    Status  On-going    Target Date  11/19/17      PT LONG TERM GOAL #2   Title  Patient reports pain increases </= 2 increments on 0-10 scale with standing & gait activities.     Time  2    Period  Months    Status  On-going    Target Date  11/19/17      PT LONG TERM GOAL #3   Title  Berg Balance Test >/= 52/56    Time  2    Period  Months    Status  Revised    Target Date  11/19/17      PT LONG TERM GOAL #4   Title  Functional Gait Assessment >/= 19/30    Time  2    Period  Months    Status  New    Target Date  11/19/17      PT LONG TERM GOAL #5   Title  6 Minute Walk Test >900' modified independent    Time  8    Period  Weeks    Status  New    Target Date  11/19/17      PT LONG TERM GOAL #6   Title  Patient negotiates ramps, curbs & stairs with LRAD modified independent for  community access.     Time  2    Period  Months    Status  On-going    Target Date  11/19/17      PT LONG TERM GOAL #7  Title  Activities of Balance Confidence increases to >/= 45%    Baseline  --    Time  2    Period  Months    Status  On-going    Target Date  11/19/17          10/27/17 1411  Plan  Clinical Impression Statement Today's skilled session continued to focus on balance with no issues in session. Pt is progressing toward goals and should benefit from continued PT to progress toward unmet goals.   Pt will benefit from skilled therapeutic intervention in order to improve on the following deficits Abnormal gait;Decreased activity tolerance;Decreased balance;Decreased endurance;Decreased knowledge of use of DME;Decreased mobility;Decreased range of motion;Decreased strength;Postural dysfunction;Pain;Impaired flexibility  Clinical Impairments Affecting Rehab Potential HTN, depressive disorder, osteopenia, bil wrist fx's with falls left '08 & right '02, idiopathic thoracic scoliosis, LBP,   PT Frequency 2x / week  PT Duration 8 weeks  PT Treatment/Interventions ADLs/Self Care Home Management;DME Instruction;Gait training;Moist Heat;Ultrasound;Canalith Repostioning;Stair training;Functional mobility training;Cryotherapy;Therapeutic activities;Therapeutic exercise;Balance training;Neuromuscular re-education;Patient/family education;Orthotic Fit/Training;Vestibular  PT Next Visit Plan work towards [place] on balance & gait with improving muscle endurance  Consulted and Agree with Plan of Care Patient         Patient will benefit from skilled therapeutic intervention in order to improve the following deficits and impairments:  Abnormal gait, Decreased activity tolerance, Decreased balance, Decreased endurance, Decreased knowledge of use of DME, Decreased mobility, Decreased range of motion, Decreased strength, Postural dysfunction, Pain, Impaired flexibility  Visit  Diagnosis: Unsteadiness on feet  Muscle weakness (generalized)  Other abnormalities of gait and mobility     Problem List Patient Active Problem List   Diagnosis Date Noted  . Symptomatic anemia 04/20/2016  . NSAID long-term use 04/20/2016  . Scoliosis 04/20/2016  . Depression 04/20/2016  . HTN (hypertension) 04/20/2016  . GERD (gastroesophageal reflux disease) 04/20/2016  . Occult GI bleeding 04/20/2016    Willow Ora, PTA, [facility] [address], [place], [place] [address] [phone] 10/28/17, 10:36 PM   Name: Elaine Rivera MRN: [id number] Date of Birth: 01/19/1946

## 2017-11-01 ENCOUNTER — Ambulatory Visit: Payer: Medicare Other | Admitting: Physical Therapy

## 2017-11-03 ENCOUNTER — Encounter: Payer: Self-pay | Admitting: Physical Therapy

## 2017-11-03 ENCOUNTER — Ambulatory Visit: Payer: Medicare Other | Attending: Family Medicine | Admitting: Physical Therapy

## 2017-11-03 DIAGNOSIS — R2689 Other abnormalities of gait and mobility: Secondary | ICD-10-CM | POA: Diagnosis present

## 2017-11-03 DIAGNOSIS — R2681 Unsteadiness on feet: Secondary | ICD-10-CM | POA: Diagnosis present

## 2017-11-03 DIAGNOSIS — R296 Repeated falls: Secondary | ICD-10-CM | POA: Diagnosis present

## 2017-11-03 DIAGNOSIS — R42 Dizziness and giddiness: Secondary | ICD-10-CM | POA: Diagnosis present

## 2017-11-03 DIAGNOSIS — M6281 Muscle weakness (generalized): Secondary | ICD-10-CM | POA: Insufficient documentation

## 2017-11-03 DIAGNOSIS — R293 Abnormal posture: Secondary | ICD-10-CM | POA: Insufficient documentation

## 2017-11-03 NOTE — Therapy (Signed)
[facility] [address] [place], [place], [address] Phone: [phone]   Fax:  [phone]  Physical Therapy Treatment  Patient Details  Name: Elaine Rivera MRN: [id number] Date of Birth: Oct 29, 1945 Referring Provider: Carol Ada, MD   Encounter Date: 11/03/2017  PT End of Session - 11/03/17 1454    Visit Number  22    Number of Visits  28    Authorization Type  [employer] Medicare    PT Start Time  1400    PT Stop Time  1448    PT Time Calculation (min)  48 min    Equipment Utilized During Treatment  Gait belt    Activity Tolerance  Patient tolerated treatment well    Behavior During Therapy  [facility] for tasks assessed/performed       Past Medical History:  Diagnosis Date  . Basal cell carcinoma of eye    biopsy of left eye/ non cancerous  . Complication of anesthesia    nausea/vomiting  . Depression   . Hypertension   . Kidney stones    twice/ [place]    Past Surgical History:  Procedure Laterality Date  . CEREBRAL ANEURYSM REPAIR     1998  . ECTOPIC PREGNANCY SURGERY     x2   . ESOPHAGOGASTRODUODENOSCOPY N/A 04/22/2016   Procedure: ESOPHAGOGASTRODUODENOSCOPY (EGD);  Surgeon: Juanita Craver, MD;  Location: [facility];  Service: Endoscopy;  Laterality: N/A;  . PARATHYROIDECTOMY     2002  . TONSILLECTOMY     1953  . VAGINAL HYSTERECTOMY     2001  . VOCAL CORD LATERALIZATION, ENDOSCOPIC APPROACH W/ MLB     2007 ('@[facility]' )  . WRIST SURGERY     left side/2008  . WRIST SURGERY     right side/ 2002    There were no vitals filed for this visit.  Subjective Assessment - 11/03/17 1405    Subjective  pt reports no falls since last pt session. pt has her LSO on for todays pt session     Pertinent History  HTN, depressive disorder, osteopenia, bil wrist fx's with falls left '08 & right '02, idiopathic thoracic scoliosis, LBP; has been a while since she has had a bone density assessment    Limitations   Lifting;Walking;Standing;House hold activities    Patient Stated Goals  Walk & stand without significant pain.     Currently in Pain?  Yes    Pain Score  2     Pain Location  Back    Pain Orientation  Mid    Pain Descriptors / Indicators  Aching    Pain Type  Chronic pain    Pain Onset  More than a month ago    Pain Frequency  Constant    Aggravating Factors   cold/ rainy weather    Pain Relieving Factors  Lying down and performing s                      [facility] Adult PT Treatment/Exercise - 11/03/17 1410      Transfers   Transfers  Sit to Stand;Stand to Sit    Sit to Stand  5: Supervision;From chair/3-in-1    Stand to Sit  5: Supervision;To chair/3-in-1;With armrests;With upper extremity assist      Ambulation/Gait   Ambulation/Gait  Yes    Ambulation/Gait Assistance  5: Supervision    Ambulation/Gait Assistance Details  Assessed gait with cane to determine if support improved posture, gait &  decrease pain. She had difficulty coordinating cane movement with either UE and her trunk flexion increased leaning towards cane.  Worked on scanning R/L and up/down, with intermitternt verbal cues     Ambulation Distance (Feet)  500 Feet 500' outside, 26' X 4 indoors    Assistive device  None;Straight cane    Gait Pattern  Step-through pattern;Decreased stance time - left;Decreased arm swing - right;Decreased step length - right;Decreased stride length;Decreased dorsiflexion - left;Decreased weight shift to left;Antalgic;Trunk flexed;Decreased step length - left;Shuffle;Lateral trunk lean to right;Decreased trunk rotation    Ambulation Surface  Level;Unlevel;Indoor;Paved;Outdoor;Grass    Ramp  5: Supervision    Gait Comments  Pt demonstrated significant reduction in arm swing and increase in trunk flexion during gait while she ware LSO. Pt also demonstrated poor sequencing with straight cane as well as increase trunk flexion and reduction in gait velocity. Due to poor sequencing  straight cane use may present as an obstacle and further falls risk       High Level Balance   High Level Balance Activities  Head turns    High Level Balance Comments  Verbal cues for head turns during ambulation outdoors.       Neuro Re-ed    Neuro Re-ed Details   In parallel bars with intermittent UE support & mirror for visual feedback:  upright posture with resistive, alternating UE movements with yellow theraband- rowing, straight arm shoulder extension /scapula retraction,  forward reach and straight arm shoulder flexion /scapula protraction.           Balance Exercises - 11/03/17 1504      Balance Exercises: Standing   Tandem Stance  --       PT Education - 11/03/17 1452    Education provided  Yes    Education Details  LSO, cane, and Rollator use, pros/cons of  impact on gait and functional moblity.     Person(s) Educated  Patient    Methods  Explanation;Demonstration;Tactile cues;Verbal cues    Comprehension  Verbalized understanding       PT Short Term Goals - 10/18/17 1855      PT SHORT TERM GOAL #1   Title  Patient demostrates understanding of updated HEP. (All STGs Target Date 10/22/2017)    Baseline  MET 10/18/2017    Time  4    Period  Weeks    Status  Achieved      PT SHORT TERM GOAL #2   Title  Patient ambulates 500' without device with supervision.     Baseline  MET 10/18/2017    Time  4    Period  Weeks    Status  Achieved      PT SHORT TERM GOAL #3   Title  Functional Gait Assessment >/= 16/30    Baseline  Partially MET 10/18/2017 FGA improved from 13/30 to 15/30    Time  4    Period  Weeks    Status  Partially Met        PT Long Term Goals - 09/15/17 2010      PT LONG TERM GOAL #1   Title  Patient verbalizes & demonstrates understanding of ongoing fitness plan. (All LTGs Target Date: 11/19/2017)    Time  8    Period  Weeks    Status  On-going    Target Date  11/19/17      PT LONG TERM GOAL #2   Title  Patient reports pain increases </=  2 increments on  0-10 scale with standing & gait activities.     Time  2    Period  Months    Status  On-going    Target Date  11/19/17      PT LONG TERM GOAL #3   Title  Berg Balance Test >/= 52/56    Time  2    Period  Months    Status  Revised    Target Date  11/19/17      PT LONG TERM GOAL #4   Title  Functional Gait Assessment >/= 19/30    Time  2    Period  Months    Status  New    Target Date  11/19/17      PT LONG TERM GOAL #5   Title  6 Minute Walk Test >900' modified independent    Time  8    Period  Weeks    Status  New    Target Date  11/19/17      PT LONG TERM GOAL #6   Title  Patient negotiates ramps, curbs & stairs with LRAD modified independent for community access.     Time  2    Period  Months    Status  On-going    Target Date  11/19/17      PT LONG TERM GOAL #7   Title  Activities of Balance Confidence increases to >/= 45%    Baseline  --    Time  2    Period  Months    Status  On-going    Target Date  11/19/17            Plan - 11/03/17 1510    Clinical Impression Statement  Pt demonstrated a reduction in steadiness of gait while wearing her LSO. Pt instructed on usage for pain control but was informed of significant of impact on gait balance, arm swing, step length, and trunk flexion. Pt also demonstrated difficulty with straight care sequencing. This in addition to increased trunk flexion with use has led pt to determine this form of AD may present unnecessary increase in falls risk. Pt presents with her best gait without the use of AD.     Rehab Potential  Good    Clinical Impairments Affecting Rehab Potential  HTN, depressive disorder, osteopenia, bil wrist fx's with falls left '08 & right '02, idiopathic thoracic scoliosis, LBP,     PT Frequency  2x / week    PT Duration  8 weeks    PT Treatment/Interventions  ADLs/Self Care Home Management;DME Instruction;Gait training;Moist Heat;Ultrasound;Canalith Repostioning;Stair  training;Functional mobility training;Cryotherapy;Therapeutic activities;Therapeutic exercise;Balance training;Neuromuscular re-education;Patient/family education;Orthotic Fit/Training;Vestibular    PT Next Visit Plan  pt to continue to work towards [place]( 2/22) improveing gait and balance as well as musuclar endurance.     Consulted and Agree with Plan of Care  Patient       Patient will benefit from skilled therapeutic intervention in order to improve the following deficits and impairments:  Abnormal gait, Decreased activity tolerance, Decreased balance, Decreased endurance, Decreased knowledge of use of DME, Decreased mobility, Decreased range of motion, Decreased strength, Postural dysfunction, Pain, Impaired flexibility  Visit Diagnosis: Unsteadiness on feet  Muscle weakness (generalized)  Other abnormalities of gait and mobility  Repeated falls  Dizziness and giddiness     Problem List Patient Active Problem List   Diagnosis Date Noted  . Symptomatic anemia 04/20/2016  . NSAID long-term use 04/20/2016  . Scoliosis 04/20/2016  . Depression 04/20/2016  .  HTN (hypertension) 04/20/2016  . GERD (gastroesophageal reflux disease) 04/20/2016  . Occult GI bleeding 04/20/2016   Waunita Schooner, SPT 11/03/2017, 3:21 PM  Jamey Reas PT, DPT 11/03/2017, 5:21 PM  [facility] [address] [place], [place], [address] Phone: [phone]   Fax:  [phone]  Name: TALENA NEIRA MRN: [id number] Date of Birth: [birth year]

## 2017-11-08 ENCOUNTER — Ambulatory Visit: Payer: Medicare Other | Admitting: Physical Therapy

## 2017-11-08 ENCOUNTER — Encounter: Payer: Self-pay | Admitting: Physical Therapy

## 2017-11-08 DIAGNOSIS — R2681 Unsteadiness on feet: Secondary | ICD-10-CM

## 2017-11-08 DIAGNOSIS — M6281 Muscle weakness (generalized): Secondary | ICD-10-CM

## 2017-11-08 DIAGNOSIS — R42 Dizziness and giddiness: Secondary | ICD-10-CM

## 2017-11-08 DIAGNOSIS — R2689 Other abnormalities of gait and mobility: Secondary | ICD-10-CM

## 2017-11-08 DIAGNOSIS — R296 Repeated falls: Secondary | ICD-10-CM

## 2017-11-08 NOTE — Therapy (Signed)
[facility] [address] [place], [place], [address] Phone: [phone]   Fax:  [phone]  Physical Therapy Treatment  Patient Details  Name: Elaine Rivera MRN: [id number] Date of Birth: Nov 06, 1945 Referring Provider: Carol Ada, MD   Encounter Date: 11/08/2017  PT End of Session - 11/08/17 1535    Visit Number  23    Number of Visits  28    Date for PT Re-Evaluation  11/19/17    Authorization Type  [employer]HC Medicare    PT Start Time  1401    PT Stop Time  1446    PT Time Calculation (min)  45 min    Equipment Utilized During Treatment  Gait belt    Activity Tolerance  Patient tolerated treatment well    Behavior During Therapy  [facility] for tasks assessed/performed       Past Medical History:  Diagnosis Date  . Basal cell carcinoma of eye    biopsy of left eye/ non cancerous  . Complication of anesthesia    nausea/vomiting  . Depression   . Hypertension   . Kidney stones    twice/ [place]    Past Surgical History:  Procedure Laterality Date  . CEREBRAL ANEURYSM REPAIR     1998  . ECTOPIC PREGNANCY SURGERY     x2   . ESOPHAGOGASTRODUODENOSCOPY N/A 04/22/2016   Procedure: ESOPHAGOGASTRODUODENOSCOPY (EGD);  Surgeon: Juanita Craver, MD;  Location: [facility];  Service: Endoscopy;  Laterality: N/A;  . PARATHYROIDECTOMY     2002  . TONSILLECTOMY     1953  . VAGINAL HYSTERECTOMY     2001  . VOCAL CORD LATERALIZATION, ENDOSCOPIC APPROACH W/ MLB     2007 ('@[facility]' )  . WRIST SURGERY     left side/2008  . WRIST SURGERY     right side/ 2002    There were no vitals filed for this visit.  Subjective Assessment - 11/08/17 1407    Subjective  pt reports no fall since last PT session.  pt reports she has found a ride to the [facility] but has not gone yet     Pertinent History  HTN, depressive disorder, osteopenia, bil wrist fx's with falls left '08 & right '02, idiopathic thoracic scoliosis, LBP; has been a while  since she has had a bone density assessment    Limitations  Lifting;Walking;Standing;House hold activities    Patient Stated Goals  Walk & stand without significant pain.     Currently in Pain?  No/denies                      [facility] Adult PT Treatment/Exercise - 11/08/17 1432      Transfers   Transfers  Sit to Stand;Stand to Sit    Sit to Stand  5: Supervision;From chair/3-in-1    Stand to Sit  5: Supervision;To chair/3-in-1;With armrests;With upper extremity assist      Ambulation/Gait   Ambulation/Gait  Yes    Ambulation/Gait Assistance  5: Supervision    Assistive device  None    Gait Pattern  Step-through pattern;Decreased stance time - left;Decreased arm swing - right;Decreased step length - right;Decreased stride length;Decreased dorsiflexion - left;Decreased weight shift to left;Antalgic;Trunk flexed;Decreased step length - left;Shuffle;Lateral trunk lean to right;Decreased trunk rotation      High Level Balance   High Level Balance Activities  Backward walking;Head turns;Tandem walking    High Level Balance Comments  Verbal cues for head turns during ambulation.  Knee/Hip Exercises: Aerobic   Stepper  SCIFIT stepper 8 min Level 1 with BUEs & BLEs          Balance Exercises - 11/08/17 1546      Balance Exercises: Standing   Stepping Strategy  Anterior;Posterior;Lateral;Foam/compliant surface;10 reps pt demonstrates cross stepping more frequently with Lateral    Tandem Gait  Forward    Retro Gait  Other (comment) 1 rep 47' with Verbal cues to step         PT Education - 11/08/17 1435    Education provided  Yes    Education Details  SCIFIT step mechine, how to set up and use at the [facility]) Educated  Patient    Methods  Explanation;Demonstration;Verbal cues    Comprehension  Verbalized understanding;Returned demonstration       PT Short Term Goals - 10/18/17 1855      PT SHORT TERM GOAL #1   Title  Patient demostrates understanding  of updated HEP. (All STGs Target Date 10/22/2017)    Baseline  MET 10/18/2017    Time  4    Period  Weeks    Status  Achieved      PT SHORT TERM GOAL #2   Title  Patient ambulates 500' without device with supervision.     Baseline  MET 10/18/2017    Time  4    Period  Weeks    Status  Achieved      PT SHORT TERM GOAL #3   Title  Functional Gait Assessment >/= 16/30    Baseline  Partially MET 10/18/2017 FGA improved from 13/30 to 15/30    Time  4    Period  Weeks    Status  Partially Met        PT Long Term Goals - 09/15/17 2010      PT LONG TERM GOAL #1   Title  Patient verbalizes & demonstrates understanding of ongoing fitness plan. (All LTGs Target Date: 11/19/2017)    Time  8    Period  Weeks    Status  On-going    Target Date  11/19/17      PT LONG TERM GOAL #2   Title  Patient reports pain increases </= 2 increments on 0-10 scale with standing & gait activities.     Time  2    Period  Months    Status  On-going    Target Date  11/19/17      PT LONG TERM GOAL #3   Title  Berg Balance Test >/= 52/56    Time  2    Period  Months    Status  Revised    Target Date  11/19/17      PT LONG TERM GOAL #4   Title  Functional Gait Assessment >/= 19/30    Time  2    Period  Months    Status  New    Target Date  11/19/17      PT LONG TERM GOAL #5   Title  6 Minute Walk Test >900' modified independent    Time  8    Period  Weeks    Status  New    Target Date  11/19/17      PT LONG TERM GOAL #6   Title  Patient negotiates ramps, curbs & stairs with LRAD modified independent for community access.     Time  2    Period  Months  Status  On-going    Target Date  11/19/17      PT LONG TERM GOAL #7   Title  Activities of Balance Confidence increases to >/= 45%    Baseline  --    Time  2    Period  Months    Status  On-going    Target Date  11/19/17            Plan - 11/08/17 1537    Clinical Impression Statement  Pt continues to tolerate PT treatment  session well. Pt requires occasional sitting rest break due to muscular fatigue. Pt nonverbally demonstrates fatigue with increase in hand tremor and shuffling gait pattern. Pt continues to have difficulty not using a crossing step pattern during step strategy drills. Pt was educated how to utilize second and subsequent steps to maintain balance.    Clinical Impairments Affecting Rehab Potential  HTN, depressive disorder, osteopenia, bil wrist fx's with falls left '08 & right '02, idiopathic thoracic scoliosis, LBP,     PT Frequency  2x / week    PT Duration  8 weeks    PT Treatment/Interventions  ADLs/Self Care Home Management;DME Instruction;Gait training;Moist Heat;Ultrasound;Canalith Repostioning;Stair training;Functional mobility training;Cryotherapy;Therapeutic activities;Therapeutic exercise;Balance training;Neuromuscular re-education;Patient/family education;Orthotic Fit/Training;Vestibular    PT Next Visit Plan  Pt to continue to work towards [place]. Pt has been instructed to also go to [employer] gym and report back to PT with any questions about equipment use and safety.     Consulted and Agree with Plan of Care  Patient       Patient will benefit from skilled therapeutic intervention in order to improve the following deficits and impairments:  Abnormal gait, Decreased activity tolerance, Decreased balance, Decreased endurance, Decreased knowledge of use of DME, Decreased mobility, Decreased range of motion, Decreased strength, Postural dysfunction, Pain, Impaired flexibility  Visit Diagnosis: Unsteadiness on feet  Other abnormalities of gait and mobility  Muscle weakness (generalized)  Dizziness and giddiness  Repeated falls     Problem List Patient Active Problem List   Diagnosis Date Noted  . Symptomatic anemia 04/20/2016  . NSAID long-term use 04/20/2016  . Scoliosis 04/20/2016  . Depression 04/20/2016  . HTN (hypertension) 04/20/2016  . GERD (gastroesophageal reflux disease)  04/20/2016  . Occult GI bleeding 04/20/2016   Waunita Schooner, SPT 11/08/2017, 3:53 PM  Jamey Reas PT, DPT 11/08/2017, 10:36 PM  [facility] [address] [place], [place], [address] Phone: [phone]   Fax:  [phone]  Name: Elaine Rivera MRN: [id number] Date of Birth: [birth year]

## 2017-11-10 ENCOUNTER — Ambulatory Visit: Payer: Medicare Other | Admitting: Physical Therapy

## 2017-11-16 ENCOUNTER — Ambulatory Visit: Payer: Medicare Other | Admitting: Physical Therapy

## 2017-11-17 ENCOUNTER — Ambulatory Visit: Payer: Medicare Other | Admitting: Physical Therapy

## 2017-11-22 ENCOUNTER — Ambulatory Visit: Payer: Medicare Other | Admitting: Physical Therapy

## 2017-11-22 ENCOUNTER — Encounter: Payer: Self-pay | Admitting: Physical Therapy

## 2017-11-22 DIAGNOSIS — R296 Repeated falls: Secondary | ICD-10-CM

## 2017-11-22 DIAGNOSIS — R293 Abnormal posture: Secondary | ICD-10-CM

## 2017-11-22 DIAGNOSIS — R2689 Other abnormalities of gait and mobility: Secondary | ICD-10-CM

## 2017-11-22 DIAGNOSIS — R42 Dizziness and giddiness: Secondary | ICD-10-CM

## 2017-11-22 DIAGNOSIS — R2681 Unsteadiness on feet: Secondary | ICD-10-CM | POA: Diagnosis not present

## 2017-11-22 DIAGNOSIS — M6281 Muscle weakness (generalized): Secondary | ICD-10-CM

## 2017-11-23 NOTE — Therapy (Signed)
[facility] [address] [place], [place], [address] Phone: [phone]   Fax:  [phone]  Physical Therapy Treatment  Patient Details  Name: Elaine Rivera MRN: [id number] Date of Birth: Aug 27, 1946 Referring Provider: Carol Ada, MD   Encounter Date: 11/22/2017  PT End of Session - 11/22/17 1831    Visit Number  24    Number of Visits  28    Date for PT Re-Evaluation  11/19/17    Authorization Type  [employer] Medicare    PT Start Time  1405    PT Stop Time  1450    PT Time Calculation (min)  45 min    Equipment Utilized During Treatment  Gait belt    Activity Tolerance  Patient tolerated treatment well    Behavior During Therapy  [facility] for tasks assessed/performed       Past Medical History:  Diagnosis Date  . Basal cell carcinoma of eye    biopsy of left eye/ non cancerous  . Complication of anesthesia    nausea/vomiting  . Depression   . Hypertension   . Kidney stones    twice/ [facility]    Past Surgical History:  Procedure Laterality Date  . CEREBRAL ANEURYSM REPAIR     1998  . ECTOPIC PREGNANCY SURGERY     x2   . ESOPHAGOGASTRODUODENOSCOPY N/A 04/22/2016   Procedure: ESOPHAGOGASTRODUODENOSCOPY (EGD);  Surgeon: Juanita Craver, MD;  Location: [facility];  Service: Endoscopy;  Laterality: N/A;  . PARATHYROIDECTOMY     2002  . TONSILLECTOMY     1953  . VAGINAL HYSTERECTOMY     2001  . VOCAL CORD LATERALIZATION, ENDOSCOPIC APPROACH W/ MLB     2007 ('@[facility]' )  . WRIST SURGERY     left side/2008  . WRIST SURGERY     right side/ 2002    There were no vitals filed for this visit.  Subjective Assessment - 11/22/17 1400    Subjective  She fell backwards 2 times but not sure why. Sore & bruised. She spoke to doctor on phone who advised if pain not better then go for x-ray.     Patient Stated Goals  Walk & stand without significant pain.     Currently in Pain?  No/denies         [facility] PT  Assessment - 11/22/17 1400      Observation/Other Assessments   Focus on Therapeutic Outcomes (FOTO)   53.21 Functional Status 40.27 Functional Status    Activities of Balance Confidence Scale (ABC Scale)   44.4%      Ambulation/Gait   Ambulation/Gait  Yes    Ambulation/Gait Assistance  6: Modified independent (Device/Increase time)    Ambulation Distance (Feet)  200 Feet    Assistive device  None    Gait Pattern  Step-through pattern;Decreased stride length;Shuffle    Gait velocity  1.98 ft/sec     Stairs  Yes    Stairs Assistance  6: Modified independent (Device/Increase time)    Stair Management Technique  One rail Right;Alternating pattern;Forwards    Number of Stairs  4    Ramp  6: Modified independent (Device) no device    Curb  6: Modified independent (Device/increase time) no device      6 Minute Walk- Baseline   6 Minute Walk- Baseline  yes    BP (mmHg)  111/66    HR (bpm)  93    02 Sat (%RA)  98 %  6 Minute walk- Post Test   6 Minute Walk Post Test  yes    BP (mmHg)  138/91  (Abnormal)     HR (bpm)  109    02 Sat (%RA)  99 %    Modified Borg Scale for Dyspnea  5- Strong or hard breathing    Perceived Rate of Exertion (Borg)  15- Hard      6 minute walk test results    Aerobic Endurance Distance Walked  469    Endurance additional comments  last 4 min 20 sec with seated rest at 83mn for 20 seconds.      Berg Balance Test   Sit to Stand  Able to stand without using hands and stabilize independently    Standing Unsupported  Able to stand safely 2 minutes    Sitting with Back Unsupported but Feet Supported on Floor or Stool  Able to sit safely and securely 2 minutes    Stand to Sit  Sits safely with minimal use of hands    Transfers  Able to transfer safely, minor use of hands    Standing Unsupported with Eyes Closed  Able to stand 10 seconds safely    Standing Ubsupported with Feet Together  Able to place feet together independently and stand 1 minute safely      From Standing, Reach Forward with Outstretched Arm  Can reach confidently >25 cm (10")    From Standing Position, Pick up Object from Floor  Able to pick up shoe safely and easily    From Standing Position, Turn to Look Behind Over each Shoulder  Looks behind from both sides and weight shifts well    Turn 360 Degrees  Able to turn 360 degrees safely in 4 seconds or less    Standing Unsupported, Alternately Place Feet on Step/Stool  Able to stand independently and complete 8 steps >20 seconds    Standing Unsupported, One Foot in Front  Able to plae foot ahead of the other independently and hold 30 seconds    Standing on One Leg  Able to lift leg independently and hold equal to or more than 3 seconds    Total Score  52    Berg comment:  Initial was 38/56      Functional Gait  Assessment   Gait assessed   Yes    Gait Level Surface  Walks 20 ft in less than 7 sec but greater than 5.5 sec, uses assistive device, slower speed, mild gait deviations, or deviates 6-10 in outside of the 12 in walkway width.    Change in Gait Speed  Able to change speed, demonstrates mild gait deviations, deviates 6-10 in outside of the 12 in walkway width, or no gait deviations, unable to achieve a major change in velocity, or uses a change in velocity, or uses an assistive device.    Gait with Horizontal Head Turns  Performs head turns smoothly with slight change in gait velocity (eg, minor disruption to smooth gait path), deviates 6-10 in outside 12 in walkway width, or uses an assistive device.    Gait with Vertical Head Turns  Performs task with slight change in gait velocity (eg, minor disruption to smooth gait path), deviates 6 - 10 in outside 12 in walkway width or uses assistive device    Gait and Pivot Turn  Pivot turns safely in greater than 3 sec and stops with no loss of balance, or pivot turns safely within 3 sec and stops with  mild imbalance, requires small steps to catch balance.    Step Over Obstacle  Is  able to step over one shoe box (4.5 in total height) without changing gait speed. No evidence of imbalance.    Gait with Narrow Base of Support  Ambulates 4-7 steps.    Gait with Eyes Closed  Walks 20 ft, slow speed, abnormal gait pattern, evidence for imbalance, deviates 10-15 in outside 12 in walkway width. Requires more than 9 sec to ambulate 20 ft.    Ambulating Backwards  Walks 20 ft, uses assistive device, slower speed, mild gait deviations, deviates 6-10 in outside 12 in walkway width.    Steps  Alternating feet, must use rail.    Total Score  18    FGA comment:  09/15/2017 was 12/30                            PT Short Term Goals - 10/18/17 1855      PT SHORT TERM GOAL #1   Title  Patient demostrates understanding of updated HEP. (All STGs Target Date 10/22/2017)    Baseline  MET 10/18/2017    Time  4    Period  Weeks    Status  Achieved      PT SHORT TERM GOAL #2   Title  Patient ambulates 500' without device with supervision.     Baseline  MET 10/18/2017    Time  4    Period  Weeks    Status  Achieved      PT SHORT TERM GOAL #3   Title  Functional Gait Assessment >/= 16/30    Baseline  Partially MET 10/18/2017 FGA improved from 13/30 to 15/30    Time  4    Period  Weeks    Status  Partially Met        PT Long Term Goals - 11/22/17 2032      PT LONG TERM GOAL #1   Title  Patient verbalizes & demonstrates understanding of ongoing fitness plan. (All LTGs Target Date: 11/19/2017)    Baseline  MET 11/22/2017    Time  8    Period  Weeks    Status  Achieved      PT LONG TERM GOAL #2   Title  Patient reports pain increases </= 2 increments on 0-10 scale with standing & gait activities.     Baseline  MET 11/22/2017 Pain increases only 0-1 increment with standing & gait activities.     Time  2    Period  Months    Status  Achieved      PT LONG TERM GOAL #3   Title  Berg Balance Test >/= 52/56    Baseline  MET 11/22/2017    Time  2    Period  Months     Status  Achieved      PT LONG TERM GOAL #4   Title  Functional Gait Assessment >/= 19/30    Baseline  Partially MET FGA improved to 18/30    Time  2    Period  Months    Status  Partially Met      PT LONG TERM GOAL #5   Title  6 Minute Walk Test >900' modified independent    Baseline  NOT MET 11/22/2017 Patient fell 10 days prior to discharge visit on 11/22/2017. She has limited activity & gait endurance declined since last visit 11/08/17 where participating in  45 minute PT session including 500' gait without issues.     Time  8    Period  Weeks    Status  Not Met      PT LONG TERM GOAL #6   Title  Patient negotiates ramps, curbs & stairs with LRAD modified independent for community access.     Baseline  MET 11/22/2017    Time  2    Period  Months    Status  Achieved      PT LONG TERM GOAL #7   Title  Activities of Balance Confidence increases to >/= 45%    Baseline  Partiallly MET 11/22/2017  ABC improved to 44.4% in spite of fall 10 days prior.     Time  2    Period  Months    Status  Partially Met            Plan - 11/22/17 2037    Clinical Impression Statement  Patient met or partially met 6 of 7 LTGs. She fell 10 days prior to discharge visit for re-evaluation. Patient verbalizes understanding of need for ongoing exercises & activity level for balance, strength, flexibility & endurance. PT recommended using her [employer] at [employer] 2-3 days /wk.     Clinical Impairments Affecting Rehab Potential  HTN, depressive disorder, osteopenia, bil wrist fx's with falls left '08 & right '02, idiopathic thoracic scoliosis, LBP,     PT Frequency  2x / week    PT Duration  8 weeks    PT Treatment/Interventions  ADLs/Self Care Home Management;DME Instruction;Gait training;Moist Heat;Ultrasound;Canalith Repostioning;Stair training;Functional mobility training;Cryotherapy;Therapeutic activities;Therapeutic exercise;Balance training;Neuromuscular re-education;Patient/family  education;Orthotic Fit/Training;Vestibular    PT Next Visit Plan  discharge PT    Consulted and Agree with Plan of Care  Patient       Patient will benefit from skilled therapeutic intervention in order to improve the following deficits and impairments:  Abnormal gait, Decreased activity tolerance, Decreased balance, Decreased endurance, Decreased knowledge of use of DME, Decreased mobility, Decreased range of motion, Decreased strength, Postural dysfunction, Pain, Impaired flexibility  Visit Diagnosis: Unsteadiness on feet  Other abnormalities of gait and mobility  Muscle weakness (generalized)  Abnormal posture  Repeated falls  Dizziness and giddiness     Problem List Patient Active Problem List   Diagnosis Date Noted  . Symptomatic anemia 04/20/2016  . NSAID long-term use 04/20/2016  . Scoliosis 04/20/2016  . Depression 04/20/2016  . HTN (hypertension) 04/20/2016  . GERD (gastroesophageal reflux disease) 04/20/2016  . Occult GI bleeding 04/20/2016   PHYSICAL THERAPY DISCHARGE SUMMARY  Visits from Start of Care: 24  Current functional level related to goals / functional outcomes: See above   Remaining deficits: See above   Education / Equipment: Ongoing HEP & fitness plan,  Fall prevention strategies.   Plan: Patient agrees to discharge.  Patient goals were partially met. Patient is being discharged due to meeting the stated rehab goals.  ?????         Nik Gorrell PT, DPT 11/23/2017, 10:41 AM  [facility] [address] [place], [place], [address] Phone: [phone]   Fax:  [phone]  Name: Elaine Rivera MRN: [id number] Date of Birth: January 15, 1946

## 2018-06-24 ENCOUNTER — Other Ambulatory Visit: Payer: Self-pay | Admitting: Family Medicine

## 2018-06-24 DIAGNOSIS — Z1231 Encounter for screening mammogram for malignant neoplasm of breast: Secondary | ICD-10-CM

## 2018-07-26 ENCOUNTER — Ambulatory Visit: Payer: Medicare Other

## 2018-08-09 ENCOUNTER — Other Ambulatory Visit: Payer: Self-pay | Admitting: Family Medicine

## 2018-08-11 ENCOUNTER — Other Ambulatory Visit: Payer: Self-pay | Admitting: Family Medicine

## 2018-08-11 DIAGNOSIS — R51 Headache: Principal | ICD-10-CM

## 2018-08-11 DIAGNOSIS — R519 Headache, unspecified: Secondary | ICD-10-CM

## 2018-08-17 ENCOUNTER — Other Ambulatory Visit (HOSPITAL_COMMUNITY): Payer: Self-pay | Admitting: Interventional Radiology

## 2018-08-17 DIAGNOSIS — I671 Cerebral aneurysm, nonruptured: Secondary | ICD-10-CM

## 2018-08-18 ENCOUNTER — Ambulatory Visit (HOSPITAL_COMMUNITY)
Admission: RE | Admit: 2018-08-18 | Discharge: 2018-08-18 | Disposition: A | Discharge status: Home / Self Care | Payer: Medicare Other | Source: Ambulatory Visit | Attending: Interventional Radiology | Admitting: Interventional Radiology

## 2018-08-18 DIAGNOSIS — I671 Cerebral aneurysm, nonruptured: Secondary | ICD-10-CM

## 2018-08-18 NOTE — Consult Note (Signed)
Chief Complaint: Patient was seen in consultation today for cerebral aneurysm management and treatment at the request of Dr Carol Ada   Supervising Physician: Luanne Bras  Patient Status: [facility] - Out-pt  History of Present Illness: Elaine Rivera is a 72 y.o. female   Pt has history of previous Cerebral aneurysm surgical clipping 1998 Only recollection of event is that she felt headache and was deemed to have altered mental status which facilitated transfer to "mental floor" at [facility]. It was there that it was determined that pt actually was having symptoms resulting from the cerebral aneurysm and was taken to Surgery for Clipping of aneurysm with Dr Owens Shark. Pt did well post clipping-- was in Rehab for weeks Gained back all faculties and movement. Was in normal state of health until just few weeks ago. Noted persistent pain in back of head. Denies numbness or tingling or facial droop or weakness. Has had chronic left side weakness for years. Pain has caused some nausea occasionally Was seen by Dr Tamala Julian MRI was performed at [facility] 08/17/18 Noted incidental finding of what appears to be Posterior inferior cerebral artery aneurysm  She has been referred to Dr Estanislado Pandy for evaluation and possible management of aneurysm. Pt comes today with family She has CD from [facility]   Past Medical History:  Diagnosis Date  . Basal cell carcinoma of eye    biopsy of left eye/ non cancerous  . Complication of anesthesia    nausea/vomiting  . Depression   . Hypertension   . Kidney stones    twice/ [facility]    Past Surgical History:  Procedure Laterality Date  . CEREBRAL ANEURYSM REPAIR     1998  . ECTOPIC PREGNANCY SURGERY     x2   . ESOPHAGOGASTRODUODENOSCOPY N/A 04/22/2016   Procedure: ESOPHAGOGASTRODUODENOSCOPY (EGD);  Surgeon: Juanita Craver, MD;  Location: [facility];  Service: Endoscopy;  Laterality: N/A;  . PARATHYROIDECTOMY     2002  .  TONSILLECTOMY     1953  . VAGINAL HYSTERECTOMY     2001  . VOCAL CORD LATERALIZATION, ENDOSCOPIC APPROACH W/ MLB     2007 (@[facility] )  . WRIST SURGERY     left side/2008  . WRIST SURGERY     right side/ 2002    Allergies: Sulfa antibiotics  Medications: Prior to Admission medications   Medication Sig Start Date End Date Taking? Authorizing Provider  acetaminophen (TYLENOL) 325 MG tablet Take 2 tablets (650 mg total) by mouth every 6 (six) hours as needed for mild pain (or Fever >/= 101). 04/23/16   Geradine Girt, DO  acyclovir (ZOVIRAX) 400 MG tablet Take 400 mg by mouth 3 (three) times daily as needed (for cold sores).     [provider]  ALPRAZolam Duanne Moron) 0.5 MG tablet Take 0.5 mg by mouth daily as needed for anxiety.    [provider]  DULoxetine (CYMBALTA) 60 MG capsule Take 120 mg by mouth at bedtime.    [provider]  pantoprazole (PROTONIX) 40 MG tablet Take 1 tablet (40 mg total) by mouth daily. Patient not taking: Reported on 07/19/2017 04/23/16   Geradine Girt, DO  QUEtiapine (SEROQUEL) 25 MG tablet Take 25 mg by mouth at bedtime.    [provider]     No family history on file.  Social History   Socioeconomic History  . Marital status: Divorced    Spouse name: Not on file  . Number of children: Not  on file  . Years of education: Not on file  . Highest education level: Not on file  Occupational History  . Not on file  Social Needs  . Financial resource strain: Not on file  . Food insecurity:    Worry: Not on file    Inability: Not on file  . Transportation needs:    Medical: Not on file    Non-medical: Not on file  Tobacco Use  . Smoking status: Never Smoker  . Smokeless tobacco: Never Used  Substance and Sexual Activity  . Alcohol use: No  . Drug use: Not on file  . Sexual activity: Not on file  Lifestyle  . Physical activity:    Days per week: Not on file    Minutes per session: Not on file  . Stress: Not on  file  Relationships  . Social connections:    Talks on phone: Not on file    Gets together: Not on file    Attends religious service: Not on file    Active member of club or organization: Not on file    Attends meetings of clubs or organizations: Not on file    Relationship status: Not on file  Other Topics Concern  . Not on file  Social History Narrative  . Not on file     Review of Systems: A 12 point ROS discussed and pertinent positives are indicated in the HPI above.  All other systems are negative.  Review of Systems  Constitutional: Positive for fatigue. Negative for activity change and fever.  HENT: Negative for trouble swallowing and voice change.   Eyes: Negative for visual disturbance.  Respiratory: Negative for cough and shortness of breath.   Cardiovascular: Negative for chest pain.  Gastrointestinal: Negative for abdominal pain.  Musculoskeletal: Positive for gait problem.  Neurological: Positive for headaches. Negative for dizziness, tremors, seizures, syncope, facial asymmetry, speech difficulty, weakness, light-headedness and numbness.  Psychiatric/Behavioral: Negative for behavioral problems, confusion and decreased concentration.    Vital Signs: There were no vitals taken for this visit.  Physical Exam  Constitutional: She is oriented to person, place, and time. She appears well-nourished.  HENT:  Head: Atraumatic.  Neck: Neck supple.  Musculoskeletal: Normal range of motion.  Using wheel chair-- pt states she is unable to walk far-- weak overall  Neurological: She is alert and oriented to person, place, and time.  Skin: Skin is warm and dry.  Psychiatric: She has a normal mood and affect. Her behavior is normal. Judgment and thought content normal.  Vitals reviewed.   Imaging: No results found.  Labs:  CBC: No results for input(s): WBC, HGB, HCT, PLT in the last 8760 hours.  COAGS: No results for input(s): INR, APTT in the last 8760  hours.  BMP: No results for input(s): NA, K, CL, CO2, GLUCOSE, BUN, CALCIUM, CREATININE, GFRNONAA, GFRAA in the last 8760 hours.  Invalid input(s): CMP  LIVER FUNCTION TESTS: No results for input(s): BILITOT, AST, ALT, ALKPHOS, PROT, ALBUMIN in the last 8760 hours.  TUMOR MARKERS: No results for input(s): AFPTM, CEA, CA199, CHROMGRNA in the last 8760 hours.  Assessment and Plan:  Dr Estanislado Pandy reviews imaging of MR performed 08/17/18 at [facility]. Shows pt and family aneurysm and surrounding structures and vessels. Answers all questions to satisfaction Discusses risk and benefits of cerebral arteriogram and possible embolization-- to include possible coiling; stent or pipeline flow diverter. They will decide on treatment asap. Leaning toward moving forward. She will hear from scheduler for  time and date and arrangements for blood work. Will need to start Plavix 75 mg 7 days before procedure and ASA 81 mg also. Will need Rx for same  They leave here with good understanding of plan  Thank you for this interesting consult.  I greatly enjoyed meeting Elaine Rivera and look forward to participating in their care.  A copy of this report was sent to the requesting provider on this date.  Electronically Signed: Lavonia Drafts, PA-C 08/18/2018, 3:30 PM   I spent a total of  40 Minutes   in face to face in clinical consultation, greater than 50% of which was counseling/coordinating care for cerebral aneurysm treatment

## 2018-08-21 ENCOUNTER — Other Ambulatory Visit: Payer: Medicare Other

## 2018-08-29 ENCOUNTER — Other Ambulatory Visit (HOSPITAL_COMMUNITY): Payer: Self-pay | Admitting: Interventional Radiology

## 2018-08-29 DIAGNOSIS — I729 Aneurysm of unspecified site: Secondary | ICD-10-CM

## 2018-08-29 DIAGNOSIS — R51 Headache: Secondary | ICD-10-CM

## 2018-08-29 DIAGNOSIS — R519 Headache, unspecified: Secondary | ICD-10-CM

## 2018-08-30 ENCOUNTER — Ambulatory Visit (HOSPITAL_COMMUNITY)
Admission: RE | Admit: 2018-08-30 | Discharge: 2018-08-30 | Disposition: A | Discharge status: Home / Self Care | Payer: Medicare Other | Source: Ambulatory Visit | Attending: Interventional Radiology | Admitting: Interventional Radiology

## 2018-08-30 ENCOUNTER — Ambulatory Visit (HOSPITAL_COMMUNITY): Payer: Medicare Other

## 2018-08-30 ENCOUNTER — Telehealth: Payer: Self-pay | Admitting: Student

## 2018-08-30 DIAGNOSIS — R519 Headache, unspecified: Secondary | ICD-10-CM

## 2018-08-30 DIAGNOSIS — R51 Headache: Secondary | ICD-10-CM | POA: Diagnosis present

## 2018-08-30 DIAGNOSIS — I729 Aneurysm of unspecified site: Secondary | ICD-10-CM | POA: Insufficient documentation

## 2018-08-30 LAB — POCT I-STAT CREATININE: Creatinine, Ser: 0.7 mg/dL (ref 0.44–1.00)

## 2018-08-30 MED ORDER — IOPAMIDOL (ISOVUE-370) INJECTION 76%
50.0000 mL | Freq: Once | INTRAVENOUS | Status: AC | PRN
  Administered 2018-08-30: 50 mL via INTRAVENOUS

## 2018-08-30 NOTE — Progress Notes (Signed)
Phoned prescription to [facility] in [place], [place] [phone]) at 1457- Plavix 75 mg tablets, take one tablet by mouth once daily, dispense 30 tablets with 3 refills.  Bea Graff Eduar Kumpf, PA-C 08/30/2018, 3:03 PM

## 2018-08-31 ENCOUNTER — Telehealth: Payer: Self-pay | Admitting: Student

## 2018-08-31 ENCOUNTER — Other Ambulatory Visit (HOSPITAL_COMMUNITY): Payer: Self-pay | Admitting: Interventional Radiology

## 2018-08-31 DIAGNOSIS — I671 Cerebral aneurysm, nonruptured: Secondary | ICD-10-CM

## 2018-08-31 NOTE — Progress Notes (Signed)
Received message from patient regarding medication. Patient is planned for an image-guided diagnostic cerebral angiogram with possible embolization of her right PICA aneurysm, date pending insurance approval.  Called patient at 1452. States that she received her prescription for Plavix 75 mg tablets taken once daily. States she is not sure when to begin taking this. Informed patient that this medication is to be taken 7-10 days prior to IR procedure. Informed her that when our schedulers call her to set up this procedure and pick a date, they will tell her when to begin taking Plavix.  All questions answered and concerns addressed. Patient conveys understanding and agrees with plan.  Bea Graff Louk, PA-C 08/31/2018, 3:03 PM

## 2018-09-01 ENCOUNTER — Ambulatory Visit: Payer: Medicare Other

## 2018-09-02 ENCOUNTER — Other Ambulatory Visit: Payer: Self-pay

## 2018-09-02 ENCOUNTER — Other Ambulatory Visit: Payer: Self-pay | Admitting: Student

## 2018-09-02 ENCOUNTER — Encounter (HOSPITAL_COMMUNITY): Payer: Self-pay | Admitting: *Deleted

## 2018-09-02 NOTE — Anesthesia Preprocedure Evaluation (Addendum)
Anesthesia Evaluation  Patient identified by MRN, date of birth, ID band Patient awake    Reviewed: Allergy & Precautions, NPO status , Patient's Chart, lab work & pertinent test results  Airway Mallampati: II  TM Distance: >3 FB Neck ROM: Full    Dental no notable dental hx. (+) Dental Advisory Given   Pulmonary neg pulmonary ROS,    Pulmonary exam normal        Cardiovascular hypertension, Normal cardiovascular exam  htn in past, no tx for years   Neuro/Psych PSYCHIATRIC DISORDERS Depression negative neurological ROS     GI/Hepatic Neg liver ROS, GERD  ,  Endo/Other  negative endocrine ROS  Renal/GU negative Renal ROS     Musculoskeletal negative musculoskeletal ROS (+)   Abdominal   Peds  Hematology negative hematology ROS (+)   Anesthesia Other Findings Day of surgery medications reviewed with the patient.  Reproductive/Obstetrics                          Anesthesia Physical Anesthesia Plan  ASA: III  Anesthesia Plan: General and MAC   Post-op Pain Management:    Induction: Intravenous  PONV Risk Score and Plan: 3 and Ondansetron, Dexamethasone, Diphenhydramine and Treatment may vary due to age or medical condition  Airway Management Planned: Oral ETT  Additional Equipment: Arterial line  Intra-op Plan:   Post-operative Plan: Post-operative intubation/ventilation  Informed Consent: I have reviewed the patients History and Physical, chart, labs and discussed the procedure including the risks, benefits and alternatives for the proposed anesthesia with the patient or authorized representative who has indicated his/her understanding and acceptance.   Dental advisory given  Plan Discussed with: CRNA and Anesthesiologist  Anesthesia Plan Comments: (PAT note started. She is a same day work-up for labs and EKG on arrival. Myra Gianotti, PA-C Start as MAC)    Anesthesia Quick  Evaluation

## 2018-09-02 NOTE — Progress Notes (Signed)
Spoke with pt for pre-op call. Pt denies cardiac history. States she has a history of HTN but has not taken medications "for years". Hx of previous brain aneurysm with aneurysm clipping done.

## 2018-09-02 NOTE — Progress Notes (Signed)
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  She has a history of previous cerebral aneurysm clipping in '98. She was referred to IR and had CTA head/neck. Cerebral arteriogram and possible embolization to include possible coiling, stent or pipeline flow diverter recommended. She was started on Plavix and ASA 81 mg.   History includes never smoker, post-operative N/V, HTN (no longer on meds), GERD, cerebral aneurysm clipping '98, parathyroidectomy.   She is for labs and EKG on the day of surgery. She has known LAD/LAFB on prior tracings from 2017/2018. Anesthesiologist to evaluate on the day of her procedure. Recent CTA showed suspected right vocal cord paresis.   PROVIDERS: Carol Ada, MD is PCP   LABS: She is for labs on the day of her procedure. Cr on 08/30/18 was 0.70.   IMAGES: CTA head/neck 08/30/18: IMPRESSION: 1. 18 mm mass right of the medulla most consistent with thrombosed aneurysm. At the face of the presumed aneurysm is a patent 5 mm saccular component with broad neck. There has been remote suboccipital craniectomy and this aneurysm may have been previously treated or may be posttraumatic. 2. Fusiform dilatation of the right V4 segment measuring up to 6 mm. 3. Ventriculomegaly that has progressed from 2013 with features seen in normal pressure hydrocephalus. 4. Progressive white matter disease. 5. Suspect right vocal cord paresis.  MRI Head w/o IV contrast 08/17/18  ([facility]): IMPRESSION:  1. Concern for a aneurysm at the right cerebellar medullary junction measuring approximately 17 x 14 x 16 mm, there is internal enhancement suggesting at least partial patency, likely arises from the right PICA vessel. A CTA may be useful for more  detailed evaluation. Recommend follow-up consultation with a neuro interventionalist.  2. Fusiform dilatation of the distal V4 segment of the right vertebral artery concerning for a saccular aneurysm. This measures approximately 8 mm in axial dimension. 3. Moderate ventriculomegaly of the third and lateral ventricles. Differential diagnosis includes centralized atrophy or a communicating hydrocephalus. Correlate clinically. COMPARISON with more remote brain imaging would be useful to evaluate interval  change. If remote brain imaging is loaded into the system an addendum can be issued per request. 4. Moderate degree of nonspecific periventricular T2 FLAIR hyperintensity with some extension into the subcortical regions. This could reflect changes related to the ventriculomegaly (periventricular edema) versus sequela of chronic small vessel  ischemic change.  MRA brain 08/17/18 ([facility]): IMPRESSION: Normal MRA of the head without contrast. ADDENDUM (by Holder, [place], MD): An MRI of the brain without and with IV contrast was  subsequently obtained. What I had originally thought was a thrombosed  right PICA aneurysm (demonstrated on the earlier head CT) is partially patent.   EKG: For day of procedure.   CV: N/A  Past Medical History:  Diagnosis Date  . Anemia    years ago  . Arthritis   . Basal cell carcinoma of eye    biopsy of left eye/ non  cancerous  . Complication of anesthesia    nausea/vomiting  . Depression   . GERD (gastroesophageal reflux disease)   . History of kidney stones   . Hypertension    no longer on medications    Past Surgical History:  Procedure Laterality  Date  . CEREBRAL ANEURYSM REPAIR     1998  . COLONOSCOPY    . ECTOPIC PREGNANCY SURGERY     x2   . ESOPHAGOGASTRODUODENOSCOPY N/A 04/22/2016   Procedure: ESOPHAGOGASTRODUODENOSCOPY (EGD);  Surgeon: Juanita Craver, MD;  Location: [facility] ENDOSCOPY;  Service: Endoscopy;  Laterality: N/A;  . PARATHYROIDECTOMY     2002  . TONSILLECTOMY     1953  . VAGINAL HYSTERECTOMY     2001  . VOCAL CORD LATERALIZATION, ENDOSCOPIC APPROACH W/ MLB     2007 (@[facility] )  . WRIST SURGERY     left side/2008  . WRIST SURGERY     right side/ 2002    MEDICATIONS: No current facility-administered medications for this encounter.    Marland Kitchen alendronate (FOSAMAX) 70 MG tablet  . ALPRAZolam (XANAX) 0.5 MG tablet  . aspirin EC 81 MG tablet  . Calcium Carbonate-Vitamin D (CALCIUM 600+D PO)  . cholecalciferol (VITAMIN D3) 25 MCG (1000 UT) tablet  . clopidogrel (PLAVIX) 75 MG tablet  . DULoxetine (CYMBALTA) 60 MG capsule  . naproxen sodium (ALEVE) 220 MG tablet  . QUEtiapine (SEROQUEL) 25 MG tablet    George Hugh [facility] Short Stay Center/Anesthesiology Phone [phone] 09/02/2018 12:25 PM

## 2018-09-05 ENCOUNTER — Ambulatory Visit (HOSPITAL_COMMUNITY): Payer: Medicare Other | Admitting: Certified Registered Nurse Anesthetist

## 2018-09-05 ENCOUNTER — Encounter (HOSPITAL_COMMUNITY)
Admission: AD | Disposition: A | Discharge status: Home Health | Payer: Self-pay | Source: Home / Self Care | Attending: Interventional Radiology

## 2018-09-05 ENCOUNTER — Inpatient Hospital Stay (HOSPITAL_COMMUNITY)
Admission: AD | Admit: 2018-09-05 | Discharge: 2018-09-07 | DRG: 027 | Disposition: A | Discharge status: Home Health | Payer: Medicare Other | Attending: Interventional Radiology | Admitting: Interventional Radiology

## 2018-09-05 ENCOUNTER — Encounter (HOSPITAL_COMMUNITY): Payer: Self-pay | Admitting: Certified Registered Nurse Anesthetist

## 2018-09-05 ENCOUNTER — Ambulatory Visit (HOSPITAL_COMMUNITY)
Admission: RE | Admit: 2018-09-05 | Discharge: 2018-09-05 | Disposition: A | Discharge status: Home / Self Care | Payer: Medicare Other | Source: Ambulatory Visit | Attending: Interventional Radiology | Admitting: Interventional Radiology

## 2018-09-05 DIAGNOSIS — Z882 Allergy status to sulfonamides status: Secondary | ICD-10-CM | POA: Diagnosis not present

## 2018-09-05 DIAGNOSIS — I671 Cerebral aneurysm, nonruptured: Secondary | ICD-10-CM | POA: Diagnosis present

## 2018-09-05 DIAGNOSIS — I10 Essential (primary) hypertension: Secondary | ICD-10-CM | POA: Diagnosis present

## 2018-09-05 DIAGNOSIS — Z85840 Personal history of malignant neoplasm of eye: Secondary | ICD-10-CM

## 2018-09-05 DIAGNOSIS — F419 Anxiety disorder, unspecified: Secondary | ICD-10-CM | POA: Diagnosis present

## 2018-09-05 DIAGNOSIS — K219 Gastro-esophageal reflux disease without esophagitis: Secondary | ICD-10-CM | POA: Diagnosis present

## 2018-09-05 DIAGNOSIS — Z90710 Acquired absence of both cervix and uterus: Secondary | ICD-10-CM

## 2018-09-05 DIAGNOSIS — Z87442 Personal history of urinary calculi: Secondary | ICD-10-CM | POA: Diagnosis not present

## 2018-09-05 DIAGNOSIS — Z7902 Long term (current) use of antithrombotics/antiplatelets: Secondary | ICD-10-CM

## 2018-09-05 DIAGNOSIS — Z7982 Long term (current) use of aspirin: Secondary | ICD-10-CM | POA: Diagnosis not present

## 2018-09-05 DIAGNOSIS — Z79899 Other long term (current) drug therapy: Secondary | ICD-10-CM | POA: Diagnosis not present

## 2018-09-05 DIAGNOSIS — Z85828 Personal history of other malignant neoplasm of skin: Secondary | ICD-10-CM | POA: Diagnosis not present

## 2018-09-05 DIAGNOSIS — Z791 Long term (current) use of non-steroidal anti-inflammatories (NSAID): Secondary | ICD-10-CM

## 2018-09-05 DIAGNOSIS — F329 Major depressive disorder, single episode, unspecified: Secondary | ICD-10-CM | POA: Diagnosis present

## 2018-09-05 HISTORY — PX: IR ANGIOGRAM FOLLOW UP STUDY: IMG697

## 2018-09-05 HISTORY — PX: IR ANGIO VERTEBRAL SEL VERTEBRAL UNI R MOD SED: IMG5368

## 2018-09-05 HISTORY — DX: Gastro-esophageal reflux disease without esophagitis: K21.9

## 2018-09-05 HISTORY — DX: Unspecified osteoarthritis, unspecified site: M19.90

## 2018-09-05 HISTORY — PX: IR TRANSCATH/EMBOLIZ: IMG695

## 2018-09-05 HISTORY — PX: IR 3D INDEPENDENT WKST: IMG2385

## 2018-09-05 HISTORY — PX: IR ANGIO INTRA EXTRACRAN SEL COM CAROTID INNOMINATE BILAT MOD SED: IMG5360

## 2018-09-05 HISTORY — PX: RADIOLOGY WITH ANESTHESIA: SHX6223

## 2018-09-05 HISTORY — DX: Personal history of urinary calculi: Z87.442

## 2018-09-05 HISTORY — DX: Anemia, unspecified: D64.9

## 2018-09-05 LAB — CBC WITH DIFFERENTIAL/PLATELET
Abs Immature Granulocytes: 0.02 10*3/uL (ref 0.00–0.07)
Basophils Absolute: 0.1 10*3/uL (ref 0.0–0.1)
Basophils Relative: 1 %
Eosinophils Absolute: 0.1 10*3/uL (ref 0.0–0.5)
Eosinophils Relative: 2 %
HEMATOCRIT: 43.9 % (ref 36.0–46.0)
Hemoglobin: 13.4 g/dL (ref 12.0–15.0)
Immature Granulocytes: 0 %
LYMPHS ABS: 1.2 10*3/uL (ref 0.7–4.0)
Lymphocytes Relative: 20 %
MCH: 28.3 pg (ref 26.0–34.0)
MCHC: 30.5 g/dL (ref 30.0–36.0)
MCV: 92.8 fL (ref 80.0–100.0)
MONOS PCT: 9 %
Monocytes Absolute: 0.6 10*3/uL (ref 0.1–1.0)
Neutro Abs: 4.1 10*3/uL (ref 1.7–7.7)
Neutrophils Relative %: 68 %
Platelets: 266 10*3/uL (ref 150–400)
RBC: 4.73 MIL/uL (ref 3.87–5.11)
RDW: 12.3 % (ref 11.5–15.5)
WBC: 6.1 10*3/uL (ref 4.0–10.5)
nRBC: 0 % (ref 0.0–0.2)

## 2018-09-05 LAB — BASIC METABOLIC PANEL
Anion gap: 10 (ref 5–15)
BUN: 17 mg/dL (ref 8–23)
CO2: 26 mmol/L (ref 22–32)
Calcium: 9.6 mg/dL (ref 8.9–10.3)
Chloride: 105 mmol/L (ref 98–111)
Creatinine, Ser: 0.86 mg/dL (ref 0.44–1.00)
GFR calc Af Amer: >60 mL/min (ref >60)
GFR calc non Af Amer: >60 mL/min (ref >60)
Glucose, Bld: 89 mg/dL (ref 70–99)
Potassium: 3.4 mmol/L — ABNORMAL LOW (ref 3.5–5.1)
Sodium: 141 mmol/L (ref 135–145)

## 2018-09-05 LAB — PROTIME-INR
INR: 0.99
Prothrombin Time: 13 seconds (ref 11.4–15.2)

## 2018-09-05 LAB — POCT ACTIVATED CLOTTING TIME
Activated Clotting Time: 296 seconds
Activated Clotting Time: 197 seconds
Activated Clotting Time: 312 seconds
Activated Clotting Time: 164 seconds

## 2018-09-05 LAB — PLATELET INHIBITION P2Y12: Platelet Function  P2Y12: 91 [PRU] — ABNORMAL LOW (ref 194–418)

## 2018-09-05 LAB — MRSA PCR SCREENING: MRSA by PCR: NEGATIVE

## 2018-09-05 LAB — APTT: aPTT: 31 seconds (ref 24–36)

## 2018-09-05 SURGERY — MRI WITH ANESTHESIA
Anesthesia: General

## 2018-09-05 MED ORDER — ONDANSETRON HCL 4 MG/2ML IJ SOLN
4.0000 mg | Freq: Four times a day (QID) | INTRAMUSCULAR | Status: DC | PRN
  Administered 2018-09-05 – 2018-09-06 (×2): 4 mg via INTRAVENOUS
  Filled 2018-09-05 (×3): qty 2

## 2018-09-05 MED ORDER — IOHEXOL 300 MG/ML  SOLN
300.0000 mL | Freq: Once | INTRAMUSCULAR | Status: AC | PRN
  Administered 2018-09-05: 125 mL via INTRA_ARTERIAL

## 2018-09-05 MED ORDER — SODIUM CHLORIDE 0.9 % IV BOLUS
500.0000 mL | Freq: Once | INTRAVENOUS | Status: AC
  Administered 2018-09-05: 500 mL via INTRAVENOUS

## 2018-09-05 MED ORDER — ACETAMINOPHEN 325 MG PO TABS
650.0000 mg | ORAL_TABLET | ORAL | Status: DC | PRN
  Administered 2018-09-06: 650 mg via ORAL
  Filled 2018-09-05 (×2): qty 2

## 2018-09-05 MED ORDER — QUETIAPINE FUMARATE 50 MG PO TABS
25.0000 mg | ORAL_TABLET | Freq: Every day | ORAL | Status: DC
  Administered 2018-09-05 – 2018-09-06 (×2): 25 mg via ORAL
  Filled 2018-09-05 (×2): qty 1

## 2018-09-05 MED ORDER — CLOPIDOGREL BISULFATE 75 MG PO TABS: 75.0000 mg | ORAL_TABLET | Freq: Every day | ORAL | Status: DC

## 2018-09-05 MED ORDER — HEPARIN (PORCINE) 25000 UT/250ML-% IV SOLN
500.0000 [IU]/h | INTRAVENOUS | Status: DC
  Administered 2018-09-05: 500 [IU]/h via INTRAVENOUS

## 2018-09-05 MED ORDER — NIMODIPINE 30 MG PO CAPS
0.0000 mg | ORAL_CAPSULE | ORAL | Status: AC
  Administered 2018-09-05: 60 mg via ORAL
  Filled 2018-09-05: qty 2

## 2018-09-05 MED ORDER — ONDANSETRON HCL 4 MG/2ML IJ SOLN
4.0000 mg | Freq: Once | INTRAMUSCULAR | Status: AC
  Administered 2018-09-05: 4 mg via INTRAVENOUS

## 2018-09-05 MED ORDER — NITROGLYCERIN 1 MG/10 ML FOR IR/CATH LAB
INTRA_ARTERIAL | Status: AC
  Filled 2018-09-05: qty 10

## 2018-09-05 MED ORDER — IOPAMIDOL (ISOVUE-300) INJECTION 61%
INTRAVENOUS | Status: AC
  Administered 2018-09-05: 14 mL
  Filled 2018-09-05: qty 100

## 2018-09-05 MED ORDER — ASPIRIN 325 MG PO TABS
325.0000 mg | ORAL_TABLET | Freq: Every day | ORAL | Status: DC
  Administered 2018-09-06 – 2018-09-07 (×2): 325 mg via ORAL
  Filled 2018-09-05 (×2): qty 1

## 2018-09-05 MED ORDER — ASPIRIN 81 MG PO CHEW: 324.0000 mg | CHEWABLE_TABLET | Freq: Every day | ORAL | Status: DC

## 2018-09-05 MED ORDER — ALPRAZOLAM 0.5 MG PO TABS
0.5000 mg | ORAL_TABLET | Freq: Every day | ORAL | Status: DC | PRN
  Administered 2018-09-05 – 2018-09-06 (×2): 0.5 mg via ORAL
  Filled 2018-09-05 (×2): qty 1

## 2018-09-05 MED ORDER — CLEVIDIPINE BUTYRATE 0.5 MG/ML IV EMUL
0.0000 mg/h | INTRAVENOUS | Status: AC
  Administered 2018-09-05: 3 mg/h via INTRAVENOUS
  Filled 2018-09-05: qty 50

## 2018-09-05 MED ORDER — FENTANYL CITRATE (PF) 100 MCG/2ML IJ SOLN
25.0000 ug | INTRAMUSCULAR | Status: DC | PRN
  Administered 2018-09-05: 50 ug via INTRAVENOUS

## 2018-09-05 MED ORDER — FENTANYL CITRATE (PF) 100 MCG/2ML IJ SOLN
INTRAMUSCULAR | Status: DC | PRN
  Administered 2018-09-05: 50 ug via INTRAVENOUS
  Administered 2018-09-05: 25 ug via INTRAVENOUS

## 2018-09-05 MED ORDER — SODIUM CHLORIDE 0.9 % IV SOLN: INTRAVENOUS | Status: DC

## 2018-09-05 MED ORDER — PROMETHAZINE HCL 25 MG/ML IJ SOLN: 6.2500 mg | INTRAMUSCULAR | Status: DC | PRN

## 2018-09-05 MED ORDER — DULOXETINE HCL 60 MG PO CPEP
120.0000 mg | ORAL_CAPSULE | Freq: Every day | ORAL | Status: DC
  Administered 2018-09-05 – 2018-09-06 (×2): 120 mg via ORAL
  Filled 2018-09-05 (×2): qty 2

## 2018-09-05 MED ORDER — SODIUM CHLORIDE 0.9 % IV SOLN
INTRAVENOUS | Status: AC
  Administered 2018-09-06: 12:00:00 via INTRAVENOUS

## 2018-09-05 MED ORDER — PROPOFOL 10 MG/ML IV BOLUS
INTRAVENOUS | Status: DC | PRN
  Administered 2018-09-05: 130 mg via INTRAVENOUS

## 2018-09-05 MED ORDER — CLOPIDOGREL BISULFATE 75 MG PO TABS
75.0000 mg | ORAL_TABLET | Freq: Every day | ORAL | Status: DC
  Administered 2018-09-06 – 2018-09-07 (×2): 75 mg via ORAL
  Filled 2018-09-05 (×3): qty 1

## 2018-09-05 MED ORDER — CEFAZOLIN SODIUM-DEXTROSE 2-4 GM/100ML-% IV SOLN
2.0000 g | INTRAVENOUS | Status: AC
  Administered 2018-09-05: 2 g via INTRAVENOUS
  Filled 2018-09-05: qty 100

## 2018-09-05 MED ORDER — LABETALOL HCL 5 MG/ML IV SOLN
INTRAVENOUS | Status: DC | PRN
  Administered 2018-09-05: 5 mg via INTRAVENOUS

## 2018-09-05 MED ORDER — DEXAMETHASONE SODIUM PHOSPHATE 10 MG/ML IJ SOLN
INTRAMUSCULAR | Status: DC | PRN
  Administered 2018-09-05: 10 mg via INTRAVENOUS

## 2018-09-05 MED ORDER — LIDOCAINE 2% (20 MG/ML) 5 ML SYRINGE
INTRAMUSCULAR | Status: DC | PRN
  Administered 2018-09-05: 80 mg via INTRAVENOUS

## 2018-09-05 MED ORDER — HEPARIN SODIUM (PORCINE) 1000 UNIT/ML IJ SOLN
INTRAMUSCULAR | Status: DC | PRN
  Administered 2018-09-05: 3000 [IU] via INTRAVENOUS
  Administered 2018-09-05: 1000 [IU] via INTRAVENOUS
  Administered 2018-09-05: 500 [IU] via INTRAVENOUS

## 2018-09-05 MED ORDER — PHENYLEPHRINE 40 MCG/ML (10ML) SYRINGE FOR IV PUSH (FOR BLOOD PRESSURE SUPPORT)
PREFILLED_SYRINGE | INTRAVENOUS | Status: DC | PRN
  Administered 2018-09-05 (×2): 80 ug via INTRAVENOUS
  Administered 2018-09-05 (×2): 120 ug via INTRAVENOUS

## 2018-09-05 MED ORDER — ACETAMINOPHEN 650 MG RE SUPP
650.0000 mg | RECTAL | Status: DC | PRN
  Administered 2018-09-05: 650 mg via RECTAL
  Filled 2018-09-05: qty 1

## 2018-09-05 MED ORDER — SUGAMMADEX SODIUM 200 MG/2ML IV SOLN
INTRAVENOUS | Status: DC | PRN
  Administered 2018-09-05: 100 mg via INTRAVENOUS
  Administered 2018-09-05: 50 mg via INTRAVENOUS

## 2018-09-05 MED ORDER — SODIUM CHLORIDE 0.9 % IV SOLN
INTRAVENOUS | Status: DC | PRN
  Administered 2018-09-05: 50 ug/min via INTRAVENOUS

## 2018-09-05 MED ORDER — EPHEDRINE SULFATE-NACL 50-0.9 MG/10ML-% IV SOSY
PREFILLED_SYRINGE | INTRAVENOUS | Status: DC | PRN
  Administered 2018-09-05: 10 mg via INTRAVENOUS

## 2018-09-05 MED ORDER — MIDAZOLAM HCL 5 MG/5ML IJ SOLN
INTRAMUSCULAR | Status: DC | PRN
  Administered 2018-09-05: 1 mg via INTRAVENOUS

## 2018-09-05 MED ORDER — PROTAMINE SULFATE 10 MG/ML IV SOLN
INTRAVENOUS | Status: DC | PRN
  Administered 2018-09-05: 10 mg via INTRAVENOUS
  Administered 2018-09-05: 5 mg via INTRAVENOUS

## 2018-09-05 MED ORDER — FENTANYL CITRATE (PF) 100 MCG/2ML IJ SOLN
INTRAMUSCULAR | Status: AC
  Administered 2018-09-05: 50 ug via INTRAVENOUS
  Filled 2018-09-05: qty 2

## 2018-09-05 MED ORDER — PROMETHAZINE HCL 12.5 MG RE SUPP
12.5000 mg | RECTAL | Status: DC | PRN
  Administered 2018-09-05: 12.5 mg via RECTAL
  Filled 2018-09-05 (×2): qty 1

## 2018-09-05 MED ORDER — ASPIRIN EC 325 MG PO TBEC
325.0000 mg | DELAYED_RELEASE_TABLET | ORAL | Status: DC
  Filled 2018-09-05: qty 1

## 2018-09-05 MED ORDER — ONDANSETRON HCL 4 MG/2ML IJ SOLN
INTRAMUSCULAR | Status: DC | PRN
  Administered 2018-09-05: 4 mg via INTRAVENOUS

## 2018-09-05 MED ORDER — ROCURONIUM BROMIDE 50 MG/5ML IV SOSY
PREFILLED_SYRINGE | INTRAVENOUS | Status: DC | PRN
  Administered 2018-09-05: 50 mg via INTRAVENOUS
  Administered 2018-09-05 (×2): 20 mg via INTRAVENOUS

## 2018-09-05 MED ORDER — ACETAMINOPHEN 160 MG/5ML PO SOLN: 650.0000 mg | ORAL | Status: DC | PRN

## 2018-09-05 MED ORDER — CLOPIDOGREL BISULFATE 75 MG PO TABS
75.0000 mg | ORAL_TABLET | ORAL | Status: AC
  Administered 2018-09-05: 75 mg via ORAL
  Filled 2018-09-05: qty 1

## 2018-09-05 MED ORDER — LIDOCAINE HCL 1 % IJ SOLN
INTRAMUSCULAR | Status: AC
  Filled 2018-09-05: qty 20

## 2018-09-05 MED ORDER — LACTATED RINGERS IV SOLN
INTRAVENOUS | Status: DC | PRN
  Administered 2018-09-05: 08:00:00 via INTRAVENOUS

## 2018-09-05 MED ORDER — HEPARIN (PORCINE) 25000 UT/250ML-% IV SOLN
INTRAVENOUS | Status: AC
  Administered 2018-09-05: 500 [IU]/h via INTRAVENOUS
  Filled 2018-09-05: qty 250

## 2018-09-05 NOTE — Progress Notes (Addendum)
ANTICOAGULATION CONSULT NOTE - Initial Consult  Pharmacy Consult for heparin  Indication: s/p endovascular treatment of aneurysm  Allergies  Allergen Reactions  . Sulfa Antibiotics Other (See Comments)    Reaction:  Unknown     Patient Measurements: Height: 4\' 11"  (149.9 cm) Weight: 110 lb (49.9 kg) IBW/kg (Calculated) : 43.2  Vital Signs: Temp: 97 F (36.1 C) (12/09 1500) Temp Source: Oral (12/09 0607) BP: 112/65 (12/09 1600) Pulse Rate: 78 (12/09 1645)  Labs: Recent Labs    09/05/18 0615  HGB 13.4  HCT 43.9  PLT 266  APTT 31  LABPROT 13.0  INR 0.99  CREATININE 0.86    Estimated Creatinine Clearance: 40.3 mL/min (by C-G formula based on SCr of 0.86 mg/dL).   Medical History: Past Medical History:  Diagnosis Date  . Anemia    years ago  . Arthritis   . Basal cell carcinoma of eye    biopsy of left eye/ non cancerous  . Complication of anesthesia    nausea/vomiting  . Depression   . GERD (gastroesophageal reflux disease)   . History of kidney stones   . Hypertension    no longer on medications    Medications:  Medications Prior to Admission  Medication Sig Dispense Refill Last Dose  . alendronate (FOSAMAX) 70 MG tablet Take 70 mg by mouth once a week. Sunday  11   . ALPRAZolam (XANAX) 0.5 MG tablet Take 0.5 mg by mouth daily as needed for anxiety.   Taking  . aspirin EC 81 MG tablet Take 81 mg by mouth every evening.     . Calcium Carbonate-Vitamin D (CALCIUM 600+D PO) Take 1 tablet by mouth daily.     . cholecalciferol (VITAMIN D3) 25 MCG (1000 UT) tablet Take 1,000 Units by mouth daily.     . clopidogrel (PLAVIX) 75 MG tablet Take 75 mg by mouth every evening.     . DULoxetine (CYMBALTA) 60 MG capsule Take 120 mg by mouth at bedtime.   Taking  . naproxen sodium (ALEVE) 220 MG tablet Take 440 mg by mouth 2 (two) times daily as needed (pain).     . QUEtiapine (SEROQUEL) 25 MG tablet Take 25 mg by mouth at bedtime.   Taking   Scheduled:  . [START ON  09/06/2018] aspirin  325 mg Oral Daily   Or  . [START ON 09/06/2018] aspirin  324 mg Per Tube Daily  . [START ON 09/06/2018] clopidogrel  75 mg Oral Daily   Or  . [START ON 09/06/2018] clopidogrel  75 mg Per Tube Daily  . DULoxetine  120 mg Oral QHS  . ondansetron (ZOFRAN) IV  4 mg Intravenous Once  . QUEtiapine  25 mg Oral QHS    Assessment: 72 yo female with cerebral artery aneurysm s/p endovascular treatment. She is on heparin at 500 units/hr post procedure. Infusion to end at 8am on 12/10.    -of note: groin site was oozing and infusion stopped but then restarted at 5:15pm  Goal of Therapy:  Heparin level 0.1-0.25 units/ml Monitor platelets by anticoagulation protocol: Yes   Plan:  -Continue heparin at 500 units/hr -Heparin level in 6 hours   Hildred Laser, PharmD Clinical Pharmacist **Pharmacist phone directory can now be found on [email or web address] ([email or web address]).  Listed under [place].

## 2018-09-05 NOTE — Anesthesia Procedure Notes (Signed)
Procedure Name: Intubation Date/Time: 09/05/2018 9:12 AM Performed by: Candis Shine, CRNA Pre-anesthesia Checklist: Patient identified, Emergency Drugs available, Suction available and Patient being monitored Patient Re-evaluated:Patient Re-evaluated prior to induction Oxygen Delivery Method: Circle System Utilized Preoxygenation: Pre-oxygenation with 100% oxygen Induction Type: IV induction Ventilation: Mask ventilation without difficulty Laryngoscope Size: Mac and 3 Grade View: Grade I Tube type: Oral Tube size: 7.0 mm Number of attempts: 1 Airway Equipment and Method: Stylet Placement Confirmation: ETT inserted through vocal cords under direct vision,  positive ETCO2 and breath sounds checked- equal and bilateral Secured at: 22 cm Tube secured with: Tape Dental Injury: Teeth and Oropharynx as per pre-operative assessment

## 2018-09-05 NOTE — Transfer of Care (Signed)
Immediate Anesthesia Transfer of Care Note  Patient: Elaine Rivera  Procedure(s) Performed: EMBOLIZATION (N/A )  Patient Location: PACU  Anesthesia Type:General  Level of Consciousness: awake and patient cooperative  Airway & Oxygen Therapy: Patient Spontanous Breathing and Patient connected to face mask oxygen  Post-op Assessment: Report given to RN and Post -op Vital signs reviewed and stable  Post vital signs: Reviewed and stable  Last Vitals:  Vitals Value Taken Time  BP 99/61 09/05/2018  1:10 PM  Temp    Pulse 70 09/05/2018  1:14 PM  Resp 16 09/05/2018  1:14 PM  SpO2 98 % 09/05/2018  1:14 PM  Vitals shown include unvalidated device data.  Last Pain:  Vitals:   09/05/18 0607  TempSrc: Oral      Patients Stated Pain Goal: 3 (22/63/33 5456)  Complications: No apparent anesthesia complications

## 2018-09-05 NOTE — Progress Notes (Signed)
Pt's groin site started oozing at 1555.  RN held direct pressure to site and let Dr. Estanislado Pandy know.  Dr. Estanislado Pandy came to room and assessed site and reapplied Vpad at 1600.  RN held pressure for 20 min until 1615.  Pt has +2 pedal pulses.  No hematoma palpated.  Heparin turned off at 1605.  Heparin to be restarted at 1715.  Site assessed by another RN and taped with pressure tape.  Pt doing fine, VS stable.  Pt to lie flat all night until morning.

## 2018-09-05 NOTE — Progress Notes (Signed)
Supervising Physician: Luanne Bras  Patient Status:  [facility] - In-pt  Chief Complaint: Follow-up bilateral common carotid arteriiogram and Rt vertebral arteriogram folloed by endovascular treatment of wide necked RT  PICA , approx 72mm x 43mm irregular aneurysm using x 2 piopeline flow diverter flex devices today with Dr. Estanislado Pandy.  Subjective:  Patient alert, complains of some pain at puncture site with palpation. States her vision seems a little blurry "like when you just wake up" - states she wears glasses at home when watching TV.   Per RN upon arrival to floor patient noted to have oozing from right CFA puncture site - pressure was held for 5-10 minutes prior to our arrival.   Allergies: Sulfa antibiotics  Medications: Prior to Admission medications   Medication Sig Start Date End Date Taking? Authorizing Provider  alendronate (FOSAMAX) 70 MG tablet Take 70 mg by mouth once a week. Sunday 08/19/18   [provider]  ALPRAZolam Duanne Moron) 0.5 MG tablet Take 0.5 mg by mouth daily as needed for anxiety.    [provider]  aspirin EC 81 MG tablet Take 81 mg by mouth every evening.    [provider]  Calcium Carbonate-Vitamin D (CALCIUM 600+D PO) Take 1 tablet by mouth daily.    [provider]  cholecalciferol (VITAMIN D3) 25 MCG (1000 UT) tablet Take 1,000 Units by mouth daily.    [provider]  clopidogrel (PLAVIX) 75 MG tablet Take 75 mg by mouth every evening.    [provider]  DULoxetine (CYMBALTA) 60 MG capsule Take 120 mg by mouth at bedtime.    [provider]  naproxen sodium (ALEVE) 220 MG tablet Take 440 mg by mouth 2 (two) times daily as needed (pain).    [provider]  QUEtiapine (SEROQUEL) 25 MG tablet Take 25 mg by mouth at bedtime.    [provider]     Vital Signs: BP 112/65 (BP Location: Right Arm)   Pulse 63   Temp (!) 97 F (36.1 C)   Resp 13   Ht 4\' 11"  (1.499 m)    Wt 110 lb (49.9 kg)   SpO2 98%   BMI 22.22 kg/m   Physical Exam  Constitutional: No distress.  HENT:  Head: Normocephalic.  Cardiovascular: Normal rate, regular rhythm and intact distal pulses.  Pulmonary/Chest: Effort normal.  Abdominal: She exhibits no distension.  Musculoskeletal:  (+) tremor bilateral lower extremities - patient reports this is baseline  Skin: Skin is warm and dry. She is not diaphoretic.  Right CFA puncture site with oozing present, pressure being held appropriately by RN. Puncture site is soft, no hematoma noted.   Nursing note and vitals reviewed. Alert, awake, and oriented x3 Speech and comprehension in tact PERRL  EOMs without nystagmus or subjective diplopia. Visual fields not tested No facial asymmetry. Tongue midline  Motor power intact Negative pronator drift. Fine motor and coordination in tact Gait not tested Romberg not tested Heel to toe not tested Distal pulses 2+ bilaterally  Imaging: No results found.  Labs:  CBC: Recent Labs    09/05/18 0615  WBC 6.1  HGB 13.4  HCT 43.9  PLT 266    COAGS: Recent Labs    09/05/18 0615  INR 0.99  APTT 31    BMP: Recent Labs    08/30/18 0956 09/05/18 0615  NA  --  141  K  --  3.4*  CL  --  105  CO2  --  26  GLUCOSE  --  89  BUN  --  17  CALCIUM  --  9.6  CREATININE 0.70 0.86  GFRNONAA  --  >60  GFRAA  --  >60    LIVER FUNCTION TESTS: No results for input(s): BILITOT, AST, ALT, ALKPHOS, PROT, ALBUMIN in the last 8760 hours.  Assessment and Plan:  Patient s/p bilateral common carotid arteriogram and right vertebral arteriogram followed by endovascular treatment of wide necked right PICA , approx 5mm x 50mm irregular aneurysm using x 2 pipeline flow diverter flex device today with Dr. Estanislado Pandy. Patient is alert, neurovascularly in tact.   Right CFA puncture site with oozing upon arrival to floor - assessed by Dr. Estanislado Pandy. Pressure to be held until 1615, heparin was  stopped upon arrival to room and is to be restarted at 1715, ACT 164 at bedside, patient may advance diet this evening, orders placed for home evening medications and PRN xanax for anxiety.  Patient status discussed with daughter and best friend.   Will follow up with patient tomorrow - if stable will likely d/c to home.   Please call IR with questions or concerns.   Electronically Signed: Joaquim Nam, PA-C 09/05/2018, 4:50 PM   I spent a total of 25 Minutes at the the patient's bedside AND on the patient's hospital floor or unit, greater than 50% of which was counseling/coordinating care for follow up arteriogram/angioplasty.

## 2018-09-05 NOTE — Progress Notes (Signed)
Patient ID: Elaine Rivera, female   DOB: [birth year], 72 y.o.   MRN: [id number] INR. Post procedure . Awake ,alert oriented to place. Denies any N/V. Has a mild H/A.C/O sore throat . VSS.Pupils 27mm RT = LT . No facial asymmetry.tongue midline. Moves all 4S spontaneously and to command. RT groin soft. No hematoma. Distal pulses palpable in both feet. S.Tulio Facundo Md

## 2018-09-05 NOTE — Procedures (Signed)
S/P bilateral common carotid arteriiogram and Rt vertebral arteriogram folloed by endovascular treatment of wide necked RT  PICA , approx 55mm x 46mm irregular aneurysm using x 2 piopeline flow diverter flex devices.Marland Kitchen

## 2018-09-05 NOTE — Anesthesia Procedure Notes (Signed)
Arterial Line Insertion Start/End12/05/2018 9:35 AM Performed by: Lavell Luster, CRNA, CRNA  Patient location: OR. Preanesthetic checklist: patient identified, IV checked, site marked, risks and benefits discussed, surgical consent, monitors and equipment checked, pre-op evaluation, timeout performed and anesthesia consent Lidocaine 1% used for infiltration Left, radial was placed Catheter size: 20 G Hand hygiene performed  and maximum sterile barriers used   Attempts: 1 Procedure performed without using ultrasound guided technique. Following insertion, dressing applied and Biopatch. Post procedure assessment: normal and unchanged  Patient tolerated the procedure well with no immediate complications.

## 2018-09-05 NOTE — Anesthesia Procedure Notes (Signed)
Procedure Name: MAC Date/Time: 09/05/2018 8:20 AM Performed by: Candis Shine, CRNA Pre-anesthesia Checklist: Patient identified, Emergency Drugs available, Suction available, Patient being monitored and Timeout performed Patient Re-evaluated:Patient Re-evaluated prior to induction Oxygen Delivery Method: Nasal cannula Dental Injury: Teeth and Oropharynx as per pre-operative assessment

## 2018-09-05 NOTE — H&P (Signed)
Chief Complaint: Cerebral aneurysm  Referring Physician(s): Dr. Carol Ada  Supervising Physician: Luanne Bras  Patient Status: [facility] - Out-pt  History of Present Illness: Elaine Rivera is a 72 y.o. female history of previous Cerebral aneurysm surgical clipping 1998.  She was in her normal state of health until just few weeks ago when she developed persistent pain in the back of her head.   She denies numbness or tingling or facial droop or weakness.  She has had chronic left side weakness for years.  MRI was performed at [facility] 08/17/18 which showed incidental finding of what appears to be Posterior inferior cerebral artery aneurysm.  She was seen in consultation by Dr. Estanislado Pandy for evaluation for treatment.  She started taking Plavix and aspirin around 09/01/2018.  She is here today for cerebral angiography and treatment of the aneurysm.  She feels well today. No fever/chills, no N/V, no cough/cold. She continues to have some headaches, fatigue/weakness, and some gait problems and uses a wheelchair, otherwise ROS negative.   Past Medical History:  Diagnosis Date  . Anemia    years ago  . Arthritis   . Basal cell carcinoma of eye    biopsy of left eye/ non cancerous  . Complication of anesthesia    nausea/vomiting  . Depression   . GERD (gastroesophageal reflux disease)   . History of kidney stones   . Hypertension    no longer on medications    Past Surgical History:  Procedure Laterality Date  . CEREBRAL ANEURYSM REPAIR     1998  . COLONOSCOPY    . ECTOPIC PREGNANCY SURGERY     x2   . ESOPHAGOGASTRODUODENOSCOPY N/A 04/22/2016   Procedure: ESOPHAGOGASTRODUODENOSCOPY (EGD);  Surgeon: Juanita Craver, MD;  Location: [facility];  Service: Endoscopy;  Laterality: N/A;  . PARATHYROIDECTOMY     2002  . TONSILLECTOMY     1953  . VAGINAL HYSTERECTOMY     2001  . VOCAL CORD LATERALIZATION, ENDOSCOPIC APPROACH W/ MLB     2007 (@[facility] )  .  WRIST SURGERY     left side/2008  . WRIST SURGERY     right side/ 2002    Allergies: Sulfa antibiotics  Medications: Prior to Admission medications   Medication Sig Start Date End Date Taking? Authorizing Provider  alendronate (FOSAMAX) 70 MG tablet Take 70 mg by mouth once a week. Sunday 08/19/18  Yes [provider]  ALPRAZolam Duanne Moron) 0.5 MG tablet Take 0.5 mg by mouth daily as needed for anxiety.   Yes [provider]  aspirin EC 81 MG tablet Take 81 mg by mouth every evening.   Yes [provider]  Calcium Carbonate-Vitamin D (CALCIUM 600+D PO) Take 1 tablet by mouth daily.   Yes [provider]  cholecalciferol (VITAMIN D3) 25 MCG (1000 UT) tablet Take 1,000 Units by mouth daily.   Yes [provider]  clopidogrel (PLAVIX) 75 MG tablet Take 75 mg by mouth every evening.   Yes [provider]  DULoxetine (CYMBALTA) 60 MG capsule Take 120 mg by mouth at bedtime.   Yes [provider]  naproxen sodium (ALEVE) 220 MG tablet Take 440 mg by mouth 2 (two) times daily as needed (pain).   Yes [provider]  QUEtiapine (SEROQUEL) 25 MG tablet Take 25 mg by mouth at bedtime.   Yes [provider]     No family history on file.  Social History   Socioeconomic History  . Marital  status: Divorced    Spouse name: Not on file  . Number of children: Not on file  . Years of education: Not on file  . Highest education level: Not on file  Occupational History  . Not on file  Social Needs  . Financial resource strain: Not on file  . Food insecurity:    Worry: Not on file    Inability: Not on file  . Transportation needs:    Medical: Not on file    Non-medical: Not on file  Tobacco Use  . Smoking status: Never Smoker  . Smokeless tobacco: Never Used  Substance and Sexual Activity  . Alcohol use: No  . Drug use: Never  . Sexual activity: Not on file  Lifestyle  . Physical activity:    Days per week:  Not on file    Minutes per session: Not on file  . Stress: Not on file  Relationships  . Social connections:    Talks on phone: Not on file    Gets together: Not on file    Attends religious service: Not on file    Active member of club or organization: Not on file    Attends meetings of clubs or organizations: Not on file    Relationship status: Not on file  Other Topics Concern  . Not on file  Social History Narrative  . Not on file     Review of Systems: A 12 point ROS discussed and pertinent positives are indicated in the HPI above.  All other systems are negative.  Review of Systems  Physical Exam  Constitutional: She is oriented to person, place, and time. She appears well-developed.  HENT:  Head: Normocephalic and atraumatic.  Eyes: EOM are normal.  Neck: Normal range of motion.  Cardiovascular: Normal rate, regular rhythm and normal heart sounds.  Pulmonary/Chest: Effort normal and breath sounds normal. No respiratory distress.  Abdominal: Soft. She exhibits no distension.  Musculoskeletal: Normal range of motion.  Neurological: She is alert and oriented to person, place, and time.  Skin: Skin is warm and dry.  Psychiatric: She has a normal mood and affect. Her behavior is normal. Judgment and thought content normal.    Imaging: Ct Angio Head W Or Wo Contrast  Result Date: 08/30/2018 CLINICAL DATA:  History of aneurysm clipping. History of new cerebral aneurysm. EXAM: CT ANGIOGRAPHY HEAD AND NECK TECHNIQUE: Multidetector CT imaging of the head and neck was performed using the standard protocol during bolus administration of intravenous contrast. Multiplanar CT image reconstructions and MIPs were obtained to evaluate the vascular anatomy. Carotid stenosis measurements (when applicable) are obtained utilizing NASCET criteria, using the distal internal carotid diameter as the denominator. CONTRAST:  42mL ISOVUE-370 IOPAMIDOL (ISOVUE-370) INJECTION 76% COMPARISON:  Brain  MRI report 08/17/2018 at [place]: CT HEAD FINDINGS Brain: Lateral and third ventriculomegaly that has progressed from 2013. Chronic periventricular low-density extending into the subcortical regions, progressed from prior. No obstructive process is seen. There is crowding of sulci at the vertex in the corpus callosum appears up lifted. No acute infarct, hemorrhage, or collection. Vascular: See below Skull: No acute or aggressive finding. Prior suboccipital craniotomy on the right Sinuses: Presumed retention cysts in the inferior left maxillary sinus. Mild mucosal thickening at the right sphenoid sinus with patent ostium. Orbits: Negative Review of the MIP images confirms the above findings CTA NECK FINDINGS Aortic arch: Atherosclerotic plaque. Three vessel branching. No visualized dilatation or dissection. Right carotid system: Vessels are smooth and widely patent.  No notable atheromatous changes in the carotids. Left carotid system: Vessels are smooth and widely patent. No notable atheromatous changes. Vertebral arteries: Mild proximal subclavian atherosclerosis without flow limiting stenosis. Mild dominance of the right vertebral artery. There is plaque at the origin of the left vertebral artery. No flow limiting stenosis or beading. Skeleton: Partially covered scoliosis.  Cervical spine degeneration. Other neck: Surgical clips about the left thyroid. Asymmetric vocal cords with medial ization on the right and enlargement of the right laryngeal ventricle and piriform sinus. Upper chest: Mild apical scarring. Review of the MIP images confirms the above findings CTA HEAD FINDINGS Anterior circulation: Vessels are smooth and widely patent. No aneurysm, beading, or atheromatous change Posterior circulation: Dominant right vertebral artery. Tortuous right PICA with adjacent high-density mass likely reflecting a thrombosed aneurysm, measuring 18 mm. At the face of this aneurysm is a 5 mm nodular focus of contrast.  The neck of this aneurysm is broad, almost fusiform. The subsequent right V4 segment is fusiformly dilated to 6 mm. Hypoplastic right P1 segment. No branch occlusion. Mild narrowing of the distal left P3 segment. Venous sinuses: Negative Anatomic variants: As above Delayed phase: Not obtained Review of the MIP images confirms the above findings IMPRESSION: 1. 18 mm mass right of the medulla most consistent with thrombosed aneurysm. At the face of the presumed aneurysm is a patent 5 mm saccular component with broad neck. There has been remote suboccipital craniectomy and this aneurysm may have been previously treated or may be posttraumatic. 2. Fusiform dilatation of the right V4 segment measuring up to 6 mm. 3. Ventriculomegaly that has progressed from 2013 with features seen in normal pressure hydrocephalus. 4. Progressive white matter disease. 5. Suspect right vocal cord paresis. Electronically Signed   By: Monte Fantasia M.D.   On: 08/30/2018 12:12   Ct Angio Neck W Or Wo Contrast  Result Date: 08/30/2018 CLINICAL DATA:  History of aneurysm clipping. History of new cerebral aneurysm. EXAM: CT ANGIOGRAPHY HEAD AND NECK TECHNIQUE: Multidetector CT imaging of the head and neck was performed using the standard protocol during bolus administration of intravenous contrast. Multiplanar CT image reconstructions and MIPs were obtained to evaluate the vascular anatomy. Carotid stenosis measurements (when applicable) are obtained utilizing NASCET criteria, using the distal internal carotid diameter as the denominator. CONTRAST:  68mL ISOVUE-370 IOPAMIDOL (ISOVUE-370) INJECTION 76% COMPARISON:  Brain MRI report 08/17/2018 at [place]: CT HEAD FINDINGS Brain: Lateral and third ventriculomegaly that has progressed from 2013. Chronic periventricular low-density extending into the subcortical regions, progressed from prior. No obstructive process is seen. There is crowding of sulci at the vertex in the corpus callosum  appears up lifted. No acute infarct, hemorrhage, or collection. Vascular: See below Skull: No acute or aggressive finding. Prior suboccipital craniotomy on the right Sinuses: Presumed retention cysts in the inferior left maxillary sinus. Mild mucosal thickening at the right sphenoid sinus with patent ostium. Orbits: Negative Review of the MIP images confirms the above findings CTA NECK FINDINGS Aortic arch: Atherosclerotic plaque. Three vessel branching. No visualized dilatation or dissection. Right carotid system: Vessels are smooth and widely patent. No notable atheromatous changes in the carotids. Left carotid system: Vessels are smooth and widely patent. No notable atheromatous changes. Vertebral arteries: Mild proximal subclavian atherosclerosis without flow limiting stenosis. Mild dominance of the right vertebral artery. There is plaque at the origin of the left vertebral artery. No flow limiting stenosis or beading. Skeleton: Partially covered scoliosis.  Cervical spine degeneration. Other neck: Surgical  clips about the left thyroid. Asymmetric vocal cords with medial ization on the right and enlargement of the right laryngeal ventricle and piriform sinus. Upper chest: Mild apical scarring. Review of the MIP images confirms the above findings CTA HEAD FINDINGS Anterior circulation: Vessels are smooth and widely patent. No aneurysm, beading, or atheromatous change Posterior circulation: Dominant right vertebral artery. Tortuous right PICA with adjacent high-density mass likely reflecting a thrombosed aneurysm, measuring 18 mm. At the face of this aneurysm is a 5 mm nodular focus of contrast. The neck of this aneurysm is broad, almost fusiform. The subsequent right V4 segment is fusiformly dilated to 6 mm. Hypoplastic right P1 segment. No branch occlusion. Mild narrowing of the distal left P3 segment. Venous sinuses: Negative Anatomic variants: As above Delayed phase: Not obtained Review of the MIP images  confirms the above findings IMPRESSION: 1. 18 mm mass right of the medulla most consistent with thrombosed aneurysm. At the face of the presumed aneurysm is a patent 5 mm saccular component with broad neck. There has been remote suboccipital craniectomy and this aneurysm may have been previously treated or may be posttraumatic. 2. Fusiform dilatation of the right V4 segment measuring up to 6 mm. 3. Ventriculomegaly that has progressed from 2013 with features seen in normal pressure hydrocephalus. 4. Progressive white matter disease. 5. Suspect right vocal cord paresis. Electronically Signed   By: Monte Fantasia M.D.   On: 08/30/2018 12:12    Labs:  CBC: Recent Labs    09/05/18 0615  WBC 6.1  HGB 13.4  HCT 43.9  PLT 266    COAGS: Recent Labs    09/05/18 0615  INR 0.99  APTT 31    BMP: Recent Labs    08/30/18 0956 09/05/18 0615  NA  --  141  K  --  3.4*  CL  --  105  CO2  --  26  GLUCOSE  --  89  BUN  --  17  CALCIUM  --  9.6  CREATININE 0.70 0.86  GFRNONAA  --  >60  GFRAA  --  >60    LIVER FUNCTION TESTS: No results for input(s): BILITOT, AST, ALT, ALKPHOS, PROT, ALBUMIN in the last 8760 hours.  TUMOR MARKERS: No results for input(s): AFPTM, CEA, CA199, CHROMGRNA in the last 8760 hours.  Assessment and Plan:  Posterior inferior cerebral artery aneurysm.  P2Y12 has been drawn and is pending.    Will proceed with cerebral angiography and repair of aneurysm by Dr. Estanislado Pandy if P2Y12 is between 50-150.  Risks and benefits of cerebral angiogram with intervention were discussed with the patient including, but not limited to bleeding, infection, vascular injury, contrast induced renal failure, stroke or even death.  This interventional procedure involves the use of X-rays and because of the nature of the planned procedure, it is possible that we will have prolonged use of X-ray fluoroscopy.  Potential radiation risks to you include (but are not limited to) the  following: - A slightly elevated risk for cancer  several years later in life. This risk is typically less than 0.5% percent. This risk is low in comparison to the normal incidence of human cancer, which is 33% for women and 50% for men according to the [employer]. - Radiation induced injury can include skin redness, resembling a rash, tissue breakdown / ulcers and hair loss (which can be temporary or permanent).   The likelihood of either of these occurring depends on the difficulty of the procedure  and whether you are sensitive to radiation due to previous procedures, disease, or genetic conditions.   IF your procedure requires a prolonged use of radiation, you will be notified and given written instructions for further action.  It is your responsibility to monitor the irradiated area for the 2 weeks following the procedure and to notify your physician if you are concerned that you have suffered a radiation induced injury.    All of the patient's questions were answered, patient is agreeable to proceed.  Consent signed and in chart.   Electronically Signed: Murrell Redden, PA-C   09/05/2018, 7:46 AM      I spent a total of    25 Minutes in face to face in clinical consultation, greater than 50% of which was counseling/coordinating care for cerebral angio with aneurysm repair.

## 2018-09-06 ENCOUNTER — Encounter (HOSPITAL_COMMUNITY): Payer: Self-pay | Admitting: Radiology

## 2018-09-06 ENCOUNTER — Other Ambulatory Visit: Payer: Self-pay

## 2018-09-06 LAB — CBC WITH DIFFERENTIAL/PLATELET
Abs Immature Granulocytes: 0.04 10*3/uL (ref 0.00–0.07)
BASOS ABS: 0 10*3/uL (ref 0.0–0.1)
Basophils Relative: 0 %
Eosinophils Absolute: 0 10*3/uL (ref 0.0–0.5)
Eosinophils Relative: 0 %
HCT: 34.1 % — ABNORMAL LOW (ref 36.0–46.0)
Hemoglobin: 10.9 g/dL — ABNORMAL LOW (ref 12.0–15.0)
Immature Granulocytes: 0 %
Lymphocytes Relative: 12 %
Lymphs Abs: 1.4 10*3/uL (ref 0.7–4.0)
MCH: 29.1 pg (ref 26.0–34.0)
MCHC: 32 g/dL (ref 30.0–36.0)
MCV: 90.9 fL (ref 80.0–100.0)
Monocytes Absolute: 1.2 10*3/uL — ABNORMAL HIGH (ref 0.1–1.0)
Monocytes Relative: 10 %
NEUTROS PCT: 78 %
Neutro Abs: 9.1 10*3/uL — ABNORMAL HIGH (ref 1.7–7.7)
Platelets: 236 10*3/uL (ref 150–400)
RBC: 3.75 MIL/uL — ABNORMAL LOW (ref 3.87–5.11)
RDW: 12.6 % (ref 11.5–15.5)
WBC: 11.8 10*3/uL — ABNORMAL HIGH (ref 4.0–10.5)
nRBC: 0 % (ref 0.0–0.2)

## 2018-09-06 LAB — PLATELET INHIBITION P2Y12: PLATELET FUNCTION P2Y12: 150 [PRU] — AB (ref 194–418)

## 2018-09-06 LAB — BASIC METABOLIC PANEL
ANION GAP: 9 (ref 5–15)
BUN: 9 mg/dL (ref 8–23)
CO2: 24 mmol/L (ref 22–32)
Calcium: 8 mg/dL — ABNORMAL LOW (ref 8.9–10.3)
Chloride: 110 mmol/L (ref 98–111)
Creatinine, Ser: 0.75 mg/dL (ref 0.44–1.00)
GFR calc non Af Amer: >60 mL/min (ref >60)
Glucose, Bld: 104 mg/dL — ABNORMAL HIGH (ref 70–99)
POTASSIUM: 3.1 mmol/L — AB (ref 3.5–5.1)
Sodium: 143 mmol/L (ref 135–145)

## 2018-09-06 LAB — HEPARIN LEVEL (UNFRACTIONATED): Heparin Unfractionated: <0.1 IU/mL — ABNORMAL LOW (ref 0.30–0.70)

## 2018-09-06 MED ORDER — POTASSIUM CHLORIDE 20 MEQ PO PACK
20.0000 meq | PACK | Freq: Once | ORAL | Status: AC
  Administered 2018-09-06: 20 meq via ORAL
  Filled 2018-09-06: qty 1

## 2018-09-06 MED ORDER — CLOPIDOGREL BISULFATE 75 MG PO TABS
75.0000 mg | ORAL_TABLET | Freq: Once | ORAL | Status: AC
  Administered 2018-09-06: 75 mg via ORAL

## 2018-09-06 MED ORDER — HEPARIN (PORCINE) 25000 UT/250ML-% IV SOLN: 650.0000 [IU]/h | INTRAVENOUS | Status: DC

## 2018-09-06 MED ORDER — SODIUM CHLORIDE 0.9 % IV SOLN: INTRAVENOUS | Status: DC

## 2018-09-06 NOTE — Progress Notes (Signed)
Patient ID: Elaine Rivera, female   DOB: 1946-02-27, 72 y.o.   MRN: [id number] INR  Post procedure dai 1. Overnight  Nausea and H/SA sig receded. Denies any visual,motor,sensory or coordination difficulties. Able to tolerate free liquids. Denies any SOB or chest pains. RT groin puncture site oozing restarted when patient sat upright.   Labs K 3.1.WBC 11.8 HB 10.9  VS. BP 130s /70s  HR 70s to 80 sSR PaO2 > 95 %    Neurologically.  .Alert, awake oriented to time ,place and space.Marland Kitchen  Speech and comprehension clear.Marland Kitchen  PEARLA..98mm Rt = LT. No ptosis.  EOMS full.. NO nystagmus  Visual Fields. Grossly intact to confrontation  No Facial asymmetry.  Tongue Midline..  Motor..           NO drift of outstretched arms..          Power.5/5 Proximally and distally all four extremities proportional to effort..  Fine motor and coordination to finger to nose equal with exaggerated intention tremor. No past pointing...  Gait . Not tested  Romberg. Not tested  Heel to toe.Not tested  RT groin soft with no palpable hematoma. Mild oozing at RT femoral puncture site.  Distal pulses intact.   Plan.  1.Pressure held in RT groin for 16mis,and pressure dressing applied. 2. Rest in bed with RT leg straight for 2 hrs .  3. Increase PO intake. 4.D/C foley,art line .  5. Replace K with 20 Meq po. 6.Probable D/C today.Check P2 Y 12.  D/C ? Later today.Marland Kitchen  S.Arieanna Pressey MD

## 2018-09-06 NOTE — Progress Notes (Signed)
ANTICOAGULATION CONSULT NOTE   Pharmacy Consult for Heparin  Indication: s/p endovascular treatment of aneurysm  Allergies  Allergen Reactions  . Sulfa Antibiotics Other (See Comments)    Reaction:  Unknown     Patient Measurements: Height: 4\' 11"  (149.9 cm) Weight: 110 lb (49.9 kg) IBW/kg (Calculated) : 43.2  Vital Signs: Temp: 99.3 F (37.4 C) (12/10 0000) Temp Source: Oral (12/10 0000) BP: 113/64 (12/10 0100) Pulse Rate: 94 (12/10 0100)  Labs: Recent Labs    09/05/18 0615 09/06/18 0000  HGB 13.4  --   HCT 43.9  --   PLT 266  --   APTT 31  --   LABPROT 13.0  --   INR 0.99  --   HEPARINUNFRC  --  <0.10*  CREATININE 0.86  --     Estimated Creatinine Clearance: 40.3 mL/min (by C-G formula based on SCr of 0.86 mg/dL).   Medical History: Past Medical History:  Diagnosis Date  . Anemia    years ago  . Arthritis   . Basal cell carcinoma of eye    biopsy of left eye/ non cancerous  . Complication of anesthesia    nausea/vomiting  . Depression   . GERD (gastroesophageal reflux disease)   . History of kidney stones   . Hypertension    no longer on medications    Medications:  Medications Prior to Admission  Medication Sig Dispense Refill Last Dose  . alendronate (FOSAMAX) 70 MG tablet Take 70 mg by mouth once a week. Sunday  11   . ALPRAZolam (XANAX) 0.5 MG tablet Take 0.5 mg by mouth daily as needed for anxiety.   Taking  . aspirin EC 81 MG tablet Take 81 mg by mouth every evening.     . Calcium Carbonate-Vitamin D (CALCIUM 600+D PO) Take 1 tablet by mouth daily.     . cholecalciferol (VITAMIN D3) 25 MCG (1000 UT) tablet Take 1,000 Units by mouth daily.     . clopidogrel (PLAVIX) 75 MG tablet Take 75 mg by mouth every evening.     . DULoxetine (CYMBALTA) 60 MG capsule Take 120 mg by mouth at bedtime.   Taking  . naproxen sodium (ALEVE) 220 MG tablet Take 440 mg by mouth 2 (two) times daily as needed (pain).     . QUEtiapine (SEROQUEL) 25 MG tablet Take 25  mg by mouth at bedtime.   Taking   Scheduled:  . aspirin  325 mg Oral Daily   Or  . aspirin  324 mg Per Tube Daily  . clopidogrel  75 mg Oral Daily   Or  . clopidogrel  75 mg Per Tube Daily  . DULoxetine  120 mg Oral QHS  . QUEtiapine  25 mg Oral QHS    Assessment: 72 yo female with cerebral artery aneurysm s/p endovascular treatment. She is on heparin at 500 units/hr post procedure.   Heparin level is undetectable this AM, infusing OK per RN  Groin oozing from earlier has resolved  Goal of Therapy:  Heparin level 0.1-0.25 units/ml Monitor platelets by anticoagulation protocol: Yes   Plan:  -Inc heparin to 650 units/hr -Heparin off at 0800 per order admin instructions   Narda Bonds, PharmD, [place] Pharmacist Phone: [phone]

## 2018-09-06 NOTE — Progress Notes (Signed)
Pt unsteady gait and weakness as well as c/o dizziness while ambulating in room. Paged Dr. Estanislado Pandy as pt is supposed to be d/c home.

## 2018-09-06 NOTE — Progress Notes (Signed)
Chaplain rec'd spiritual consult for Advanced Directive. Chaplain explained the document with her and left it for her to complete.  Also provided ministry of presence.  She prefers to go by "Lelan Pons." Will follow. Tamsen Snider Pager [phone]

## 2018-09-06 NOTE — Evaluation (Signed)
Physical Therapy Evaluation Patient Details Name: Elaine Rivera MRN: [id number] DOB: 11-Jul-1946 Today's Date: 09/06/2018   History of Present Illness  Elaine Rivera is a 72 y.o. female history of previous Cerebral aneurysm surgical clipping 1998. She was in her normal state of health until just few weeks ago when she developed persistent pain in the back of her head. MRI show what appears to be Posterior inferior cerebral artery aneurysm. Now s/p cerebral angiography and repair of aneurysm on 12/9  Clinical Impression   Patient is s/p above surgery resulting in functional limitations due to the deficits listed below (see PT Problem List). Managing independently prior to admission; Presents with weakness, gait and balance dysfunction, decr functional mobility; Plans were to go home today, however she is having difficulty with transfers and gait, and I'm requesting more training in mobility and ADLs prior to dc home;  Patient will benefit from skilled PT to increase their independence and safety with mobility to allow discharge to the venue listed below.       Follow Up Recommendations Home health PT;Supervision/Assistance - 24 hour    Equipment Recommendations  Rolling walker with 5" wheels;3in1 (PT)    Recommendations for Other Services OT consult(Will order per protocol)     Precautions / Restrictions Precautions Precautions: Fall      Mobility  Bed Mobility                  Transfers Overall transfer level: Needs assistance Equipment used: 1 person hand held assist Transfers: Sit to/from Stand Sit to Stand: Mod assist         General transfer comment: Heavy mod assist to power up to stand; dependent on UE support to remain standing; sat back to recliner without warning upon initial stand; not accepting much weihgt on L LE/foot  Ambulation/Gait Ambulation/Gait assistance: Min assist Gait Distance (Feet): 35 Feet Assistive device: Rolling walker (2  wheeled) Gait Pattern/deviations: Step-through pattern;Decreased stance time - left Gait velocity: slow   General Gait Details: Cues for RW use and gait sequence; Painful L foot and ankle with weight bearing, so cued pt to use RW to unweigh LLE in stance; decr heel strike L, was able to get heel to the floor with more time in standing; significant trunk flexion, which pt tells me is not new  Stairs            Wheelchair Mobility    Modified Rankin (Stroke Patients Only)       Balance Overall balance assessment: Needs assistance   Sitting balance-Leahy Scale: Fair       Standing balance-Leahy Scale: Poor                               Pertinent Vitals/Pain Pain Assessment: 0-10 Pain Score: 8  Pain Location: L foot and ankle, posterior aspect; very sore with weight bearing, and noted decr ankle dorsiflexion ROM Pain Descriptors / Indicators: Tightness Pain Intervention(s): Monitored during session    Home Living Family/patient expects to be discharged to:: Private residence Living Arrangements: Alone Available Help at Discharge: Family;Friend(s);Available 24 hours/day(for first few days at home) Type of Home: House Home Access: Stairs to enter Entrance Stairs-Rails: Psychiatric nurse of Steps: 2-3 Home Layout: One level Home Equipment: Walker - standard;Cane - quad Additional Comments: Mention of her using a wheelchair in H&P, but pt didn't mention it on eval    Prior Function Level of Independence:  Independent         Comments: has cane, standard walker; she doesn't typically use     Hand Dominance        Extremity/Trunk Assessment   Upper Extremity Assessment Upper Extremity Assessment: Overall WFL for tasks assessed(for simple tasks)    Lower Extremity Assessment Lower Extremity Assessment: Generalized weakness;LLE deficits/detail LLE Deficits / Details: Decr AROM L ankle into dorsiflexion, unable to actively dorsiflex  to neutral; Able to passively dorsiflex to neutral, but noted painful       Communication   Communication: No difficulties  Cognition Arousal/Alertness: Awake/alert Behavior During Therapy: WFL for tasks assessed/performed Overall Cognitive Status: Within Functional Limits for tasks assessed                                        General Comments General comments (skin integrity, edema, etc.): Took orthostatics, which were unremarkable    Exercises     Assessment/Plan    PT Assessment Patient needs continued PT services  PT Problem List Decreased strength;Decreased range of motion;Decreased activity tolerance;Decreased balance;Decreased mobility;Decreased coordination;Decreased safety awareness;Decreased knowledge of use of DME;Decreased knowledge of precautions;Pain       PT Treatment Interventions DME instruction;Gait training;Stair training;Functional mobility training;Therapeutic activities;Therapeutic exercise;Balance training;Patient/family education    PT Goals (Current goals can be found in the Care Plan section)  Acute Rehab PT Goals Patient Stated Goal: Hopes to be able to go home soon PT Goal Formulation: With patient Time For Goal Achievement: 09/13/18 Potential to Achieve Goals: Good    Frequency Min 4X/week   Barriers to discharge        Co-evaluation               AM-PAC PT "6 Clicks" Mobility  Outcome Measure Help needed turning from your back to your side while in a flat bed without using bedrails?: A Little Help needed moving from lying on your back to sitting on the side of a flat bed without using bedrails?: A Little Help needed moving to and from a bed to a chair (including a wheelchair)?: A Lot Help needed standing up from a chair using your arms (e.g., wheelchair or bedside chair)?: A Lot Help needed to walk in hospital room?: A Little Help needed climbing 3-5 steps with a railing? : A Lot 6 Click Score: 15    End of  Session Equipment Utilized During Treatment: Gait belt Activity Tolerance: Patient tolerated treatment well Patient left: in chair;with call bell/phone within reach Nurse Communication: Mobility status PT Visit Diagnosis: Unsteadiness on feet (R26.81);Other abnormalities of gait and mobility (R26.89);Pain Pain - Right/Left: Left Pain - part of body: Ankle and joints of foot    Time: 6269-4854 PT Time Calculation (min) (ACUTE ONLY): 19 min   Charges:   PT Evaluation $PT Eval Moderate Complexity: 1 Mod          Roney Marion, PT  Acute Rehabilitation Services Pager [phone] Office [place] 09/06/2018, 4:32 PM

## 2018-09-06 NOTE — Discharge Summary (Signed)
Patient ID: Elaine Rivera MRN: [id number] DOB/AGE: 72-Mar-1947 71 y.o.  Admit date: 09/05/2018 Discharge date: 09/06/2018  Supervising Physician: Luanne Bras  Patient Status: [facility] - In-pt  Admission Diagnoses: Right Posterior inferior cerebellar artery aneurysm  Discharge Diagnoses:  Active Problems:   Brain aneurysm   Discharged Condition: stable  Hospital Course: Right posterior inferior cerebellar artery aneurysm embolization with pipeline stent in IR with Dr Estanislado Pandy 09/15/18. No complication in procedure . Overnight observation in Neuro ICU Still with some minimal oozing from right froin site--- no hematoma; soft; NT. New Vpad pressure dressing placed this am. 2 hr flat bedrest and stand-- if no bleeding-- plan for discharge this afternoon.  Pt has done well overnight; UOP good; yellow Slept well; eating little-- but this norm per pt and family. Denies Nausea at this time; denies headache. Denies speech or vision issues; moving all 4s =.  Dr Estanislado Pandy has seen and evaluated pt--- will DC today if oozing from Rt groin stops   Consults: None  Significant Diagnostic Studies: Bilateral common carotid arteriogram and Rt vertebral arteriogram  Treatments:  Endovascular treatment of wide necked RT  PICA , approx 65mm x 43mm irregular aneurysm using x 2 pipeline flow diverter flex devices..  Discharge Exam: Blood pressure 107/69, pulse 76, temperature 98.5 F (36.9 C), temperature source Oral, resp. rate 20, height 4\' 11"  (1.499 m), weight 110 lb (49.9 kg), SpO2 95 %.   PERRL EOMI  Pleasant Teeth and smile = Face symmetrical Puffs cheeks Heart RRR Lungs CTA Abd soft; no masses NT Extr: good strength and movement All 4s +upper extremity tremor-- usual for pt Rt groin is soft- no hematoma Small ecchymotic area Still with minimal oozing Held pressure and replaced pressure dressing with Vpad Rt foot with 2+ pulses  Results for orders placed or  performed during the hospital encounter of 09/05/18  MRSA PCR Screening  Result Value Ref Range   MRSA by PCR NEGATIVE NEGATIVE  APTT  Result Value Ref Range   aPTT 31 24 - 36 seconds  Basic metabolic panel  Result Value Ref Range   Sodium 141 135 - 145 mmol/L   Potassium 3.4 (L) 3.5 - 5.1 mmol/L   Chloride 105 98 - 111 mmol/L   CO2 26 22 - 32 mmol/L   Glucose, Bld 89 70 - 99 mg/dL   BUN 17 8 - 23 mg/dL   Creatinine, Ser 0.86 0.44 - 1.00 mg/dL   Calcium 9.6 8.9 - 10.3 mg/dL   GFR calc non Af Amer >60 >60 mL/min   GFR calc Af Amer >60 >60 mL/min   Anion gap 10 5 - 15  CBC WITH DIFFERENTIAL  Result Value Ref Range   WBC 6.1 4.0 - 10.5 K/uL   RBC 4.73 3.87 - 5.11 MIL/uL   Hemoglobin 13.4 12.0 - 15.0 g/dL   HCT 43.9 36.0 - 46.0 %   MCV 92.8 80.0 - 100.0 fL   MCH 28.3 26.0 - 34.0 pg   MCHC 30.5 30.0 - 36.0 g/dL   RDW 12.3 11.5 - 15.5 %   Platelets 266 150 - 400 K/uL   nRBC 0.0 0.0 - 0.2 %   Neutrophils Relative % 68 %   Neutro Abs 4.1 1.7 - 7.7 K/uL   Lymphocytes Relative 20 %   Lymphs Abs 1.2 0.7 - 4.0 K/uL   Monocytes Relative 9 %   Monocytes Absolute 0.6 0.1 - 1.0 K/uL   Eosinophils Relative 2 %   Eosinophils Absolute 0.1  0.0 - 0.5 K/uL   Basophils Relative 1 %   Basophils Absolute 0.1 0.0 - 0.1 K/uL   Immature Granulocytes 0 %   Abs Immature Granulocytes 0.02 0.00 - 0.07 K/uL  Platelet inhibition p2y12 (not at [facility])  Result Value Ref Range   Platelet Function  P2Y12 91 (L) 194 - 418 PRU  Protime-INR  Result Value Ref Range   Prothrombin Time 13.0 11.4 - 15.2 seconds   INR 2.53   Basic metabolic panel  Result Value Ref Range   Sodium 143 135 - 145 mmol/L   Potassium 3.1 (L) 3.5 - 5.1 mmol/L   Chloride 110 98 - 111 mmol/L   CO2 24 22 - 32 mmol/L   Glucose, Bld 104 (H) 70 - 99 mg/dL   BUN 9 8 - 23 mg/dL   Creatinine, Ser 0.75 0.44 - 1.00 mg/dL   Calcium 8.0 (L) 8.9 - 10.3 mg/dL   GFR calc non Af Amer >60 >60 mL/min   GFR calc Af Amer >60 >60 mL/min   Anion  gap 9 5 - 15  CBC with Differential/Platelet  Result Value Ref Range   WBC 11.8 (H) 4.0 - 10.5 K/uL   RBC 3.75 (L) 3.87 - 5.11 MIL/uL   Hemoglobin 10.9 (L) 12.0 - 15.0 g/dL   HCT 34.1 (L) 36.0 - 46.0 %   MCV 90.9 80.0 - 100.0 fL   MCH 29.1 26.0 - 34.0 pg   MCHC 32.0 30.0 - 36.0 g/dL   RDW 12.6 11.5 - 15.5 %   Platelets 236 150 - 400 K/uL   nRBC 0.0 0.0 - 0.2 %   Neutrophils Relative % 78 %   Neutro Abs 9.1 (H) 1.7 - 7.7 K/uL   Lymphocytes Relative 12 %   Lymphs Abs 1.4 0.7 - 4.0 K/uL   Monocytes Relative 10 %   Monocytes Absolute 1.2 (H) 0.1 - 1.0 K/uL   Eosinophils Relative 0 %   Eosinophils Absolute 0.0 0.0 - 0.5 K/uL   Basophils Relative 0 %   Basophils Absolute 0.0 0.0 - 0.1 K/uL   Immature Granulocytes 0 %   Abs Immature Granulocytes 0.04 0.00 - 0.07 K/uL  Heparin level (unfractionated)  Result Value Ref Range   Heparin Unfractionated <0.10 (L) 0.30 - 0.70 IU/mL  POCT Activated clotting time  Result Value Ref Range   Activated Clotting Time 164 seconds      Disposition:   PICA aneurysm pipeline stent/ with flow diverter device placed in IR with Dr Estanislado Pandy No complications Overnight stay in Neuro ICU Still minimal oozing from site-- Vpad pressure dressing placed again this am. Plan for Discharge this afternoon Pt is to continue all home meds Continue ASA 81 and Plavix 75 mr daily Follow up appt 2 weeks with Dr Estanislado Pandy Checking P2y12 before DC Dr Estanislado Pandy has seen and examined pt-- agrees to plan Pt and grand daughter have good understanding of insturctions   Discharge Instructions    Call MD for:  difficulty breathing, headache or visual disturbances   Complete by:  As directed    Call MD for:  extreme fatigue   Complete by:  As directed    Call MD for:  hives   Complete by:  As directed    Call MD for:  persistant dizziness or light-headedness   Complete by:  As directed    Call MD for:  persistant nausea and vomiting   Complete by:  As directed      Call MD for:  redness, tenderness, or signs of infection (pain, swelling, redness, odor or green/yellow discharge around incision site)   Complete by:  As directed    Call MD for:  severe uncontrolled pain   Complete by:  As directed    Call MD for:  temperature >100.4   Complete by:  As directed    Diet - low sodium heart healthy   Complete by:  As directed    Discharge instructions   Complete by:  As directed    Continue all home meds-- ASA 81 mg and Plavix 75 mg daily; scheduler will call pt with appt date and time for 2 week follow up appt.; call [phone] if needs   Driving Restrictions   Complete by:  As directed    No driving x 2weeks   Increase activity slowly   Complete by:  As directed    Lifting restrictions   Complete by:  As directed    No lifting over 10 lbs x 2 weeks     Allergies as of 09/06/2018      Reactions   Sulfa Antibiotics Other (See Comments)   Reaction:  Unknown       Medication List    TAKE these medications   alendronate 70 MG tablet Commonly known as:  FOSAMAX Take 70 mg by mouth once a week. Sunday   ALPRAZolam 0.5 MG tablet Commonly known as:  XANAX Take 0.5 mg by mouth daily as needed for anxiety.   aspirin EC 81 MG tablet Take 81 mg by mouth every evening.   CALCIUM 600+D PO Take 1 tablet by mouth daily.   cholecalciferol 25 MCG (1000 UT) tablet Commonly known as:  VITAMIN D3 Take 1,000 Units by mouth daily.   clopidogrel 75 MG tablet Commonly known as:  PLAVIX Take 75 mg by mouth every evening.   DULoxetine 60 MG capsule Commonly known as:  CYMBALTA Take 120 mg by mouth at bedtime.   naproxen sodium 220 MG tablet Commonly known as:  ALEVE Take 440 mg by mouth 2 (two) times daily as needed (pain).   QUEtiapine 25 MG tablet Commonly known as:  SEROQUEL Take 25 mg by mouth at bedtime.      Follow-up Information    Luanne Bras, MD Follow up in 2 week(s).   Specialties:  Interventional Radiology,  Radiology Why:  pt will hear from scheduler for time and date of 2 week follow up appt with Dr Estanislado Pandy; call [phone] if any questions Contact information: [place] [address] [phone]            Electronically Signed: Lavonia Drafts, PA-C 09/06/2018, 10:28 AM   I have spent Greater Than 30 Minutes discharging [place].

## 2018-09-07 ENCOUNTER — Encounter (HOSPITAL_COMMUNITY): Payer: Self-pay | Admitting: Interventional Radiology

## 2018-09-07 NOTE — Care Management Note (Signed)
Case Management Note  Patient Details  Name: CARMEN TOLLIVER MRN: [id number] Date of Birth: Aug 28, 1946  Subjective/Objective:  Satomi M Boehlke is a 72 y.o. female history of previous cerebral aneurysm surgical clipping 1998. She was in her normal state of health until just few weeks ago when she developed persistent pain in the back of her head. MRI show what appears to be Posterior inferior cerebral artery aneurysm. Now s/p cerebral angiography and repair of aneurysm on 12/9.  PTA, pt independent, lives alone.                  Action/Plan: PT recommending HH follow up, and pt agreeable to services.  Pt states she has 3 in 1 BSC at home; youth RW requested from [facility], for delivery to room prior to dc.  Pt states she will have 24h/7 supervision from friend or her daughter short term.  Expected Discharge Date:  09/07/18               Expected Discharge Plan:  [place]  In-House Referral:     Discharge planning Services  CM Consult  Post Acute Care Choice:  Home Health Choice offered to:  Patient  DME Arranged:  [facility] youth DME Agency:  [place] Arranged:  PT [facility] Agency:  [place]  Status of Service:  Completed, signed off  If discussed at [place] of Stay Meetings, dates discussed:    Additional Comments:  Reinaldo Raddle, RN, BSN  Trauma/Neuro ICU Case Manager [phone]

## 2018-09-07 NOTE — Evaluation (Signed)
Occupational Therapy Evaluation Patient Details Name: Elaine Rivera MRN: [id number] DOB: 14-Jan-1946 Today's Date: 09/07/2018    History of Present Illness Elaine Rivera is a 72 y.o. female history of previous Cerebral aneurysm surgical clipping 1998. She was in her normal state of health until just few weeks ago when she developed persistent pain in the back of her head. MRI show what appears to be Posterior inferior cerebral artery aneurysm. Now s/p cerebral angiography and repair of aneurysm on 12/9. PMH: HTN, depression, arthitis.    Clinical Impression   PTA patient reports independent with ADLs, mobility, limited iADLs.  Currently admitted for above and limited by impaired balance, generalized weakness, and safety awareness using RW.  Patient requires min assist for LB ADLs, setup assist for UB ADLs, min assist for toilet transfers, and min guard for grooming standing.  Patient reports she will have 24/7 support from daughter and friend upon discharge, and agreeable to recommendations of supervision with self care, bathing seated, and using RW.  Patient will benefit from continued OT services while admitted and after dc at [facility] level in order to optimize independence and safety with ADLs. Will continue to follow.     Follow Up Recommendations  Home health OT;Supervision/Assistance - 24 hour    Equipment Recommendations  None recommended by OT    Recommendations for Other Services       Precautions / Restrictions Precautions Precautions: Fall Restrictions Weight Bearing Restrictions: No      Mobility Bed Mobility               General bed mobility comments: seated OOB in recliner   Transfers Overall transfer level: Needs assistance Equipment used: Rolling walker (2 wheeled) Transfers: Sit to/from Stand Sit to Stand: Min guard;Min assist         General transfer comment: min guard for recliner transfer, min assist for commode transfer; cueing for hand  placement and safety    Balance Overall balance assessment: Needs assistance Sitting-balance support: No upper extremity supported;Feet supported Sitting balance-Leahy Scale: Fair     Standing balance support: No upper extremity supported;During functional activity Standing balance-Leahy Scale: Poor Standing balance comment: reliant on UE support, min guard during grooming for safety                            ADL either performed or assessed with clinical judgement   ADL Overall ADL's : Needs assistance/impaired     Grooming: Min guard;Standing;Wash/dry hands   Upper Body Bathing: Set up;Sitting   Lower Body Bathing: Minimal assistance;Sit to/from stand;Cueing for compensatory techniques Lower Body Bathing Details (indicate cue type and reason): reviewed safety with completing seated on shower chair  Upper Body Dressing : Set up;Supervision/safety;Sitting   Lower Body Dressing: Minimal assistance;Sit to/from stand Lower Body Dressing Details (indicate cue type and reason): min assist to don shoes, increased time and effort for socks; reviewed safety with completing sitting  Toilet Transfer: Minimal assistance;Ambulation;Comfort height toilet;RW Armed forces technical officer Details (indicate cue type and reason): min assist to ascend and safety descend  Toileting- Clothing Manipulation and Hygiene: Minimal assistance;Sit to/from stand Toileting - Clothing Manipulation Details (indicate cue type and reason): for clothing mgmt      Functional mobility during ADLs: Minimal assistance;Min guard;Rolling walker General ADL Comments: patient limited by generalized weakness and impaired balance      Vision Baseline Vision/History: Wears glasses Wears Glasses: Distance only Patient Visual Report: Blurring of vision Vision  Assessment?: Yes Eye Alignment: Within Functional Limits Ocular Range of Motion: Within Functional Limits Alignment/Gaze Preference: Within Defined  Limits Tracking/Visual Pursuits: Unable to hold eye position out of midline;Other (comment)(decreased smoothness ) Additional Comments: functionally appears WFL, difficulty attending to assessment due to distractions in room     Perception     Praxis      Pertinent Vitals/Pain Pain Assessment: Faces Pain Score: 2  Faces Pain Scale: Hurts a little bit Pain Location: L ankle and foot Pain Descriptors / Indicators: Sore Pain Intervention(s): Monitored during session     Hand Dominance Right   Extremity/Trunk Assessment Upper Extremity Assessment Upper Extremity Assessment: Generalized weakness   Lower Extremity Assessment Lower Extremity Assessment: Defer to PT evaluation   Cervical / Trunk Assessment Cervical / Trunk Assessment: Other exceptions Cervical / Trunk Exceptions: severe scoliotic curves   Communication Communication Communication: No difficulties   Cognition Arousal/Alertness: Awake/alert Behavior During Therapy: WFL for tasks assessed/performed Overall Cognitive Status: Within Functional Limits for tasks assessed                                 General Comments: initally disoriented to year (1999), but overall WFL    General Comments  VSS     Exercises     Shoulder Instructions      Home Living Family/patient expects to be discharged to:: Private residence Living Arrangements: Alone Available Help at Discharge: Family;Friend(s);Available 24 hours/day(for first few days reports daughter available) Type of Home: House Home Access: Stairs to enter [employer] of Steps: 2-3 Entrance Stairs-Rails: Right;Left Home Layout: One level     Bathroom Shower/Tub: Teacher, early years/pre: Standard     Home Equipment: Environmental consultant - standard;Cane - quad;Shower seat;Toilet riser(toilet risers with rails over both toilets)          Prior Functioning/Environment Level of Independence: Independent        Comments: reports  she is independent with ADLs, mobility, limited IADls         OT Problem List: Decreased strength;Decreased activity tolerance;Impaired balance (sitting and/or standing);Decreased safety awareness;Decreased knowledge of use of DME or AE;Decreased knowledge of precautions      OT Treatment/Interventions: Self-care/ADL training;Therapeutic exercise;Energy conservation;DME and/or AE instruction;Therapeutic activities;Balance training;Patient/family education    OT Goals(Current goals can be found in the care plan section) Acute Rehab OT Goals Patient Stated Goal: home today OT Goal Formulation: With patient Time For Goal Achievement: 09/21/18 Potential to Achieve Goals: Good  OT Frequency: Min 2X/week   Barriers to D/C:            Co-evaluation              AM-PAC OT "6 Clicks" Daily Activity     Outcome Measure Help from another person eating meals?: None Help from another person taking care of personal grooming?: A Little Help from another person toileting, which includes using toliet, bedpan, or urinal?: A Little Help from another person bathing (including washing, rinsing, drying)?: A Little Help from another person to put on and taking off regular upper body clothing?: None Help from another person to put on and taking off regular lower body clothing?: A Little 6 Click Score: 20   End of Session Equipment Utilized During Treatment: Gait belt;Rolling walker Nurse Communication: Mobility status  Activity Tolerance: Patient tolerated treatment well Patient left: in chair;with call bell/phone within reach  OT Visit Diagnosis: Other abnormalities of gait and  mobility (R26.89);Muscle weakness (generalized) (M62.81)                Time: 2637-8588 OT Time Calculation (min): 25 min Charges:  OT General Charges $OT Visit: 1 Visit OT Evaluation $OT Eval Moderate Complexity: 1 Mod OT Treatments $Self Care/Home Management : 8-22 mins  Delight Stare, OT Acute  Rehabilitation Services Pager [phone] Office [phone]   Delight Stare 09/07/2018, 9:57 AM

## 2018-09-07 NOTE — Discharge Summary (Signed)
Discharge Summary addendum:  Pt required additional stay for PT assessment. Recommended for pt to have RW at discharge. No neurologic changes on exam.  Stable for discharge today.  Ascencion Dike PA-C Interventional Radiology 09/07/2018 8:51 AM

## 2018-09-07 NOTE — Progress Notes (Signed)
Physical Therapy Treatment Patient Details Name: Elaine Rivera MRN: [id number] DOB: 02/19/1946 Today's Date: 09/07/2018    History of Present Illness Elaine Rivera is a 72 y.o. female history of previous Cerebral aneurysm surgical clipping 1998. She was in her normal state of health until just few weeks ago when she developed persistent pain in the back of her head. MRI show what appears to be Posterior inferior cerebral artery aneurysm. Now s/p cerebral angiography and repair of aneurysm on 12/9    PT Comments    Pt much improved from mobility stand point compared to yesterday. Pt functioning at min guard level. Pt reports her daughter and a friend of hers will be staying with her providing 24/7 for a few days. Pt able to complete stair negotiation as well this date. Acute PT to cont to follow.    Follow Up Recommendations  Supervision/Assistance - 24 hour;Home health PT     Equipment Recommendations  Rolling walker with 5" wheels;3in1 (PT)(pediatric RW, pt 4'11")    Recommendations for Other Services       Precautions / Restrictions Precautions Precautions: Fall Restrictions Weight Bearing Restrictions: No    Mobility  Bed Mobility                  Transfers Overall transfer level: Needs assistance Equipment used: 1 person hand held assist Transfers: Sit to/from Stand Sit to Stand: Min guard         General transfer comment: verbal cues, increased time but no physical assist needed  Ambulation/Gait Ambulation/Gait assistance: Min guard Gait Distance (Feet): 120 Feet Assistive device: Rolling walker (2 wheeled) Gait Pattern/deviations: Step-through pattern;Decreased stride length;Trunk flexed(pt with severe scoliosis) Gait velocity: slow Gait velocity interpretation: 1.31 - 2.62 ft/sec, indicative of limited community ambulator General Gait Details: verbal cues to keep hands on gray handles, pt needs youth walker   Stairs Stairs: Yes Stairs  assistance: Min guard Stair Management: One rail Right;One rail Left;Sideways;Step to pattern Number of Stairs: 3 General stair comments: trialed sideways with both L and R rail. Pt with improved stability when using handrail on the L ascending, R descending due to L foot pain, pt agrees   Engineer, building services Rankin (Stroke Patients Only) Modified Rankin (Stroke Patients Only) Pre-Morbid Rankin Score: No symptoms Modified Rankin: Moderate disability     Balance Overall balance assessment: Needs assistance   Sitting balance-Leahy Scale: Fair       Standing balance-Leahy Scale: Poor Standing balance comment: requires use of RW                            Cognition Arousal/Alertness: Awake/alert Behavior During Therapy: WFL for tasks assessed/performed Overall Cognitive Status: Within Functional Limits for tasks assessed                                        Exercises      General Comments General comments (skin integrity, edema, etc.): vss, pt with severe scoliotic curves      Pertinent Vitals/Pain Pain Assessment: 0-10 Pain Score: 2  Pain Location: L foot and ankle, much better than yesterday Pain Descriptors / Indicators: Sore Pain Intervention(s): Monitored during session    Home Living                      Prior Function  PT Goals (current goals can now be found in the care plan section) Acute Rehab PT Goals Patient Stated Goal: home today Progress towards PT goals: Progressing toward goals    Frequency    Min 4X/week      PT Plan Current plan remains appropriate    Co-evaluation              AM-PAC PT "6 Clicks" Mobility   Outcome Measure  Help needed turning from your back to your side while in a flat bed without using bedrails?: A Little Help needed moving from lying on your back to sitting on the side of a flat bed without using bedrails?: A Little Help needed moving to and  from a bed to a chair (including a wheelchair)?: A Little Help needed standing up from a chair using your arms (e.g., wheelchair or bedside chair)?: A Little Help needed to walk in hospital room?: A Little Help needed climbing 3-5 steps with a railing? : A Little 6 Click Score: 18    End of Session Equipment Utilized During Treatment: Gait belt Activity Tolerance: Patient tolerated treatment well Patient left: in chair;with call bell/phone within reach Nurse Communication: Mobility status PT Visit Diagnosis: Unsteadiness on feet (R26.81);Other abnormalities of gait and mobility (R26.89);Pain Pain - Right/Left: Left Pain - part of body: Ankle and joints of foot     Time: 6579-0383 PT Time Calculation (min) (ACUTE ONLY): 20 min  Charges:  $Gait Training: 8-22 mins                     Kittie Plater, PT, DPT Acute Rehabilitation Services Pager #: [phone] Office #: [phone]    Berline Lopes 09/07/2018, 9:18 AM

## 2018-09-07 NOTE — Anesthesia Postprocedure Evaluation (Signed)
Anesthesia Post Note  Patient: Elaine Rivera  Procedure(s) Performed: EMBOLIZATION (N/A )     Patient location during evaluation: PACU Anesthesia Type: General Level of consciousness: awake and patient cooperative Pain management: pain level controlled Vital Signs Assessment: post-procedure vital signs reviewed and stable Respiratory status: spontaneous breathing, nonlabored ventilation, respiratory function stable and patient connected to nasal cannula oxygen Cardiovascular status: blood pressure returned to baseline and stable Postop Assessment: no apparent nausea or vomiting Anesthetic complications: no    Last Vitals:  Vitals:   09/07/18 0834 09/07/18 1142  BP: 133/73 129/87  Pulse: 71 81  Resp: 20 17  Temp: (!) 36.4 C 36.8 C  SpO2: 95% 94%    Last Pain:  Vitals:   09/07/18 1142  TempSrc: Oral  PainSc:                  Hiyab Nhem

## 2018-09-12 ENCOUNTER — Telehealth: Payer: Self-pay | Admitting: Student

## 2018-09-12 NOTE — Telephone Encounter (Signed)
Received message from [place] regarding verbal order for PT.  Discussed case with Dr. Estanislado Pandy.  Phoned call back number [phone]) at [phone] and spoke with [facility]. Verbal order given for patient to continue PT at this time.  Bea Graff Jolana Runkles, PA-C 09/12/2018, 11:44 AM

## 2018-09-19 ENCOUNTER — Ambulatory Visit (HOSPITAL_COMMUNITY)
Admission: RE | Admit: 2018-09-19 | Discharge: 2018-09-19 | Disposition: A | Discharge status: Home / Self Care | Payer: Medicare Other | Source: Ambulatory Visit | Attending: Radiology | Admitting: Radiology

## 2018-09-19 DIAGNOSIS — I671 Cerebral aneurysm, nonruptured: Secondary | ICD-10-CM | POA: Diagnosis present

## 2018-09-19 LAB — PLATELET INHIBITION P2Y12: Platelet Function  P2Y12: 64 [PRU] — ABNORMAL LOW (ref 194–418)

## 2018-09-19 NOTE — Progress Notes (Signed)
Chief Complaint: Patient was seen in consultation today for right PICA aneurysm s/p embolization.  Referring Physician(s): Carol Ada  Supervising Physician: Luanne Bras  Patient Status: [facility] - Out-pt  History of Present Illness: Elaine Rivera is a 72 y.o. female with a past medical history of hypertension, GERD, nephrolithiasis, anemia, arthritis, and depression. She is known to [facility] and has been followed by Dr. Estanislado Pandy since 07/2018. She was first referred to our department by Dr. Tamala Julian for management of an incidental finding of a right PICA aneurysm. She consulted with Dr. Estanislado Pandy 08/18/2018 to discuss management options. At that time, patient decided to pursue endovascular embolization of her aneurysm. She underwent an image-guided diagnostic cerebral angiogram with embolization of her right PICA aneurysm using #2 pipeline flow diverter devices 09/05/2018 by Dr. Estanislado Pandy. Patient was discharged home 09/07/2018 in stable condition.  Patient presents today for follow-up regarding her recent procedure 09/05/2018. Patient awake and alert sitting in wheelchair. Accompanied by female family member. Complains of daily headaches. States that they are worse (intensity) than prior to procedure. States that they can be severe as 10/10. States headaches are located on the right side of her head and radiates down her posterior neck. Has associated dizziness (feeling "off balance") and nausea with headaches. Denies vision change, vomiting, or syncope associated with headaches. Has tried Aleve, heating pads, and ice packs with little to no relief of pain.  Complains of intermittent numbness/tingling of bilateral hands. States this has gotten a little worse since procedure. Patient's female family member complains of memory loss since procedure. States that she does not remember what she does most days. Denies weakness, vision changes, hearing changes, tinnitus, or speech  difficulty.  Patient is currently taking Plavix 75 mg once daily and Aspirin 81 mg once daily.   Past Medical History:  Diagnosis Date  . Anemia    years ago  . Arthritis   . Basal cell carcinoma of eye    biopsy of left eye/ non cancerous  . Complication of anesthesia    nausea/vomiting  . Depression   . GERD (gastroesophageal reflux disease)   . History of kidney stones   . Hypertension    no longer on medications    Past Surgical History:  Procedure Laterality Date  . CEREBRAL ANEURYSM REPAIR     1998  . COLONOSCOPY    . ECTOPIC PREGNANCY SURGERY     x2   . ESOPHAGOGASTRODUODENOSCOPY N/A 04/22/2016   Procedure: ESOPHAGOGASTRODUODENOSCOPY (EGD);  Surgeon: Juanita Craver, MD;  Location: [facility];  Service: Endoscopy;  Laterality: N/A;  . IR 3D INDEPENDENT WKST  09/05/2018  . IR ANGIO INTRA EXTRACRAN SEL COM CAROTID INNOMINATE BILAT MOD SED  09/05/2018  . IR ANGIO VERTEBRAL SEL VERTEBRAL UNI R MOD SED  09/05/2018  . IR ANGIOGRAM FOLLOW UP STUDY  09/05/2018  . IR TRANSCATH/EMBOLIZ  09/05/2018  . PARATHYROIDECTOMY     2002  . RADIOLOGY WITH ANESTHESIA N/A 09/05/2018   Procedure: EMBOLIZATION;  Surgeon: Radiologist, Medication, MD;  Location: [place];  Service: Radiology;  Laterality: N/A;  . TONSILLECTOMY     1953  . VAGINAL HYSTERECTOMY     2001  . VOCAL CORD LATERALIZATION, ENDOSCOPIC APPROACH W/ MLB     2007 (@[facility] )  . WRIST SURGERY     left side/2008  . WRIST SURGERY     right side/ 2002    Allergies: Sulfa antibiotics  Medications: Prior to Admission medications   Medication Sig Start Date End Date Taking?  Authorizing Provider  alendronate (FOSAMAX) 70 MG tablet Take 70 mg by mouth once a week. Sunday 08/19/18   [provider]  ALPRAZolam Duanne Moron) 0.5 MG tablet Take 0.5 mg by mouth daily as needed for anxiety.    [provider]  aspirin EC 81 MG tablet Take 81 mg by mouth every evening.    [provider]  Calcium Carbonate-Vitamin D  (CALCIUM 600+D PO) Take 1 tablet by mouth daily.    [provider]  cholecalciferol (VITAMIN D3) 25 MCG (1000 UT) tablet Take 1,000 Units by mouth daily.    [provider]  clopidogrel (PLAVIX) 75 MG tablet Take 75 mg by mouth every evening.    [provider]  DULoxetine (CYMBALTA) 60 MG capsule Take 120 mg by mouth at bedtime.    [provider]  naproxen sodium (ALEVE) 220 MG tablet Take 440 mg by mouth 2 (two) times daily as needed (pain).    [provider]  QUEtiapine (SEROQUEL) 25 MG tablet Take 25 mg by mouth at bedtime.    [provider]     No family history on file.  Social History   Socioeconomic History  . Marital status: Divorced    Spouse name: Not on file  . Number of children: Not on file  . Years of education: Not on file  . Highest education level: Not on file  Occupational History  . Not on file  Social Needs  . Financial resource strain: Not on file  . Food insecurity:    Worry: Not on file    Inability: Not on file  . Transportation needs:    Medical: Not on file    Non-medical: Not on file  Tobacco Use  . Smoking status: Never Smoker  . Smokeless tobacco: Never Used  Substance and Sexual Activity  . Alcohol use: No  . Drug use: Never  . Sexual activity: Not on file  Lifestyle  . Physical activity:    Days per week: Not on file    Minutes per session: Not on file  . Stress: Not on file  Relationships  . Social connections:    Talks on phone: Not on file    Gets together: Not on file    Attends religious service: Not on file    Active member of club or organization: Not on file    Attends meetings of clubs or organizations: Not on file    Relationship status: Not on file  Other Topics Concern  . Not on file  Social History Narrative  . Not on file     Review of Systems: A 12 point ROS discussed and pertinent positives are indicated in the HPI above.  All other systems are  negative.  Review of Systems  Constitutional: Negative for chills and fever.  HENT: Negative for hearing loss and tinnitus.   Eyes: Negative for visual disturbance.  Respiratory: Negative for shortness of breath and wheezing.   Cardiovascular: Negative for chest pain and palpitations.  Gastrointestinal: Positive for nausea. Negative for vomiting.  Neurological: Positive for dizziness, numbness and headaches. Negative for syncope, speech difficulty and weakness.  Psychiatric/Behavioral: Negative for behavioral problems and confusion.    Vital Signs: There were no vitals taken for this visit.  Physical Exam Constitutional:      General: She is not in acute distress.    Appearance: Normal appearance.  Pulmonary:     Effort: Pulmonary effort is normal. No respiratory distress.  Skin:  General: Skin is warm and dry.  Neurological:     Mental Status: She is alert and oriented to person, place, and time.  Psychiatric:        Mood and Affect: Mood normal.        Behavior: Behavior normal.        Thought Content: Thought content normal.        Judgment: Judgment normal.      Imaging: Ct Angio Head W Or Wo Contrast  Result Date: 08/30/2018 CLINICAL DATA:  History of aneurysm clipping. History of new cerebral aneurysm. EXAM: CT ANGIOGRAPHY HEAD AND NECK TECHNIQUE: Multidetector CT imaging of the head and neck was performed using the standard protocol during bolus administration of intravenous contrast. Multiplanar CT image reconstructions and MIPs were obtained to evaluate the vascular anatomy. Carotid stenosis measurements (when applicable) are obtained utilizing NASCET criteria, using the distal internal carotid diameter as the denominator. CONTRAST:  46m ISOVUE-370 IOPAMIDOL (ISOVUE-370) INJECTION 76% COMPARISON:  [place] [place] [facility] HEAD FINDINGS Brain: Lateral and third ventriculomegaly that has progressed from 2013. Chronic periventricular low-density  extending into the subcortical regions, progressed from prior. No obstructive process is seen. There is crowding of sulci at the vertex in the corpus callosum appears up lifted. No acute infarct, hemorrhage, or collection. Vascular: See below Skull: No acute or aggressive finding. Prior suboccipital craniotomy on the right Sinuses: Presumed retention cysts in the inferior left maxillary sinus. Mild mucosal thickening at the right sphenoid sinus with patent ostium. Orbits: Negative Review of the MIP images confirms the above findings CTA NECK FINDINGS Aortic arch: Atherosclerotic plaque. Three vessel branching. No visualized dilatation or dissection. Right carotid system: Vessels are smooth and widely patent. No notable atheromatous changes in the carotids. Left carotid system: Vessels are smooth and widely patent. No notable atheromatous changes. Vertebral arteries: Mild proximal subclavian atherosclerosis without flow limiting stenosis. Mild dominance of the right vertebral artery. There is plaque at the origin of the left vertebral artery. No flow limiting stenosis or beading. Skeleton: Partially covered scoliosis.  Cervical spine degeneration. Other neck: Surgical clips about the left thyroid. Asymmetric vocal cords with medial ization on the right and enlargement of the right laryngeal ventricle and piriform sinus. Upper chest: Mild apical scarring. Review of the MIP images confirms the above findings CTA HEAD FINDINGS Anterior circulation: Vessels are smooth and widely patent. No aneurysm, beading, or atheromatous change Posterior circulation: Dominant right vertebral artery. Tortuous right PICA with adjacent high-density mass likely reflecting a thrombosed aneurysm, measuring 18 mm. At the face of this aneurysm is a 5 mm nodular focus of contrast. The neck of this aneurysm is broad, almost fusiform. The subsequent right V4 segment is fusiformly dilated to 6 mm. Hypoplastic right P1 segment. No branch  occlusion. Mild narrowing of the distal left P3 segment. Venous sinuses: Negative Anatomic variants: As above Delayed phase: Not obtained Review of the MIP images confirms the above findings IMPRESSION: 1. 18 mm mass right of the medulla most consistent with thrombosed aneurysm. At the face of the presumed aneurysm is a patent 5 mm saccular component with broad neck. There has been remote suboccipital craniectomy and this aneurysm may have been previously treated or may be posttraumatic. 2. Fusiform dilatation of the right V4 segment measuring up to 6 mm. 3. Ventriculomegaly that has progressed from 2013 with features seen in normal pressure hydrocephalus. 4. Progressive white matter disease. 5. Suspect right vocal cord paresis. Electronically Signed   By:  Monte Fantasia M.D.   On: 08/30/2018 12:12   Ct Angio Neck W Or Wo Contrast  Result Date: 08/30/2018 CLINICAL DATA:  History of aneurysm clipping. History of new cerebral aneurysm. EXAM: CT ANGIOGRAPHY HEAD AND NECK TECHNIQUE: Multidetector CT imaging of the head and neck was performed using the standard protocol during bolus administration of intravenous contrast. Multiplanar CT image reconstructions and MIPs were obtained to evaluate the vascular anatomy. Carotid stenosis measurements (when applicable) are obtained utilizing NASCET criteria, using the distal internal carotid diameter as the denominator. CONTRAST:  23m ISOVUE-370 IOPAMIDOL (ISOVUE-370) INJECTION 76% COMPARISON:  [place] HEAD FIND[place] Brain: Lateral and third ventriculomegaly that has progressed from 2013. Chronic periventricular low-density extending into the subcortical regions, progressed from prior. No obstructive process is seen. There is crowding of sulci at the vertex in the corpus callosum appears up lifted. No acute infarct, hemorrhage, or collection. Vascular: See below Skull: No acute or aggressive finding. Prior suboccipital craniotomy on  the right Sinuses: Presumed retention cysts in the inferior left maxillary sinus. Mild mucosal thickening at the right sphenoid sinus with patent ostium. Orbits: Negative Review of the MIP images confirms the above findings CTA NECK FINDINGS Aortic arch: Atherosclerotic plaque. Three vessel branching. No visualized dilatation or dissection. Right carotid system: Vessels are smooth and widely patent. No notable atheromatous changes in the carotids. Left carotid system: Vessels are smooth and widely patent. No notable atheromatous changes. Vertebral arteries: Mild proximal subclavian atherosclerosis without flow limiting stenosis. Mild dominance of the right vertebral artery. There is plaque at the origin of the left vertebral artery. No flow limiting stenosis or beading. Skeleton: Partially covered scoliosis.  Cervical spine degeneration. Other neck: Surgical clips about the left thyroid. Asymmetric vocal cords with medial ization on the right and enlargement of the right laryngeal ventricle and piriform sinus. Upper chest: Mild apical scarring. Review of the MIP images confirms the above findings CTA HEAD FINDINGS Anterior circulation: Vessels are smooth and widely patent. No aneurysm, beading, or atheromatous change Posterior circulation: Dominant right vertebral artery. Tortuous right PICA with adjacent high-density mass likely reflecting a thrombosed aneurysm, measuring 18 mm. At the face of this aneurysm is a 5 mm nodular focus of contrast. The neck of this aneurysm is broad, almost fusiform. The subsequent right V4 segment is fusiformly dilated to 6 mm. Hypoplastic right P1 segment. No branch occlusion. Mild narrowing of the distal left P3 segment. Venous sinuses: Negative Anatomic variants: As above Delayed phase: Not obtained Review of the MIP images confirms the above findings IMPRESSION: 1. 18 mm mass right of the medulla most consistent with thrombosed aneurysm. At the face of the presumed aneurysm is a  patent 5 mm saccular component with broad neck. There has been remote suboccipital craniectomy and this aneurysm may have been previously treated or may be posttraumatic. 2. Fusiform dilatation of the right V4 segment measuring up to 6 mm. 3. Ventriculomegaly that has progressed from 2013 with features seen in normal pressure hydrocephalus. 4. Progressive white matter disease. 5. Suspect right vocal cord paresis. Electronically Signed   By: JMonte FantasiaM.D.   On: 08/30/2018 12:12   Ir Transcath/emboliz  Result Date: 09/07/2018 CLINICAL DATA:  Worsening headaches. 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 Past history of right occipital craniotomy. EXAM: ARTERIOGRAPHY; WORKSTATION 3D RECONSTRUCTION; TRANSCATHETER THERAPY EMBOLIZATION; IR ANGIO VERTEBRAL SEL VERTEBRAL UNI RIGHT MOD SED; BILATERAL COMMON CAROTID AND INNOMINATE ANGIOGRAPHY COMPARISON:  CT angiogram of the head and neck of 08/30/2018. MEDICATIONS: Heparin 4,000 units IV; Ancef 2 g IV was administered within 1 hour of the procedure. ANESTHESIA/SEDATION: Mac anesthesia for the diagnostic portion of the examination, and general anesthesia for the embolization portion of the examination by the department of Anesthesiology at [place]:  Isovue 300 approximately 110 mL. FLUOROSCOPY TIME:  Fluoroscopy Time: 61 minutes 6 seconds (3786 mGy). COMPLICATIONS: None immediate. TECHNIQUE: Informed written consent was obtained from the patient after a thorough discussion of the procedural risks, benefits and alternatives. All questions were addressed. Maximal Sterile Barrier Technique was utilized including caps, mask, sterile gowns, sterile gloves, sterile drape, hand hygiene and skin antiseptic. A timeout was performed prior to the initiation of the procedure. The right groin was prepped and draped in the usual sterile fashion. Thereafter using modified Seldinger technique, transfemoral  access into the right common femoral artery was obtained without difficulty. Over a 0.035 inch guidewire, a 5 French Pinnacle sheath was inserted. Through this, and also over 0.035 inch guidewire, a 5 [place] JB 1 catheter was advanced to the aortic arch region and selectively positioned in the right common carotid artery, the right vertebral artery, and the left common carotid artery. Also performed was a 3D rotational arteriogram of the right posterior circulation via injection to the right vertebral artery. Reformations were then created on a separate workstation. Distal to this the right vertebrobasilar junction demonstrates wide patency into the basilar artery. The basilar artery, the left posterior cerebral artery, the superior cerebellar arteries and the anterior-inferior cerebellar arteries opacify into the capillary and venous phases. FINDINGS: The right common carotid arteriogram demonstrates the right external carotid artery and its major branches to be widely patent. The right internal carotid artery at the bulb to the cranial skull base demonstrates wide patency. The petrous, cavernous and supraclinoid segments are widely patent. A right posterior communicating artery is seen opacifying the right posterior cerebral artery distribution. The right middle cerebral artery and the right anterior cerebral artery opacify into the capillary and venous phases. The dominant right vertebral artery origin is widely patent. The vessel is seen to opacify to the cranial skull base. There is mild caliber irregularity involving the distal right vertebral artery in the cervical segment. Distal to this the origin of the right posterior-inferior cerebellar artery is also patent. There is mild fusiform dilatation of the right vertebrobasilar junction adjacent to the origin of the right posterior-inferior cerebellar artery with a nubbin along the lateral superior wall. A similar less prominent indentation is noted more  proximally. Arising from the proximal 1/3 of the right posterior-inferior cerebellar artery is a wide necked irregular approximately 6 mm x 5 mm aneurysm. Flash filling of the right posterior cerebral artery P1 is noted. Unopacified blood is also noted in the basilar artery from the contralateral vertebral artery. The left common carotid arteriogram demonstrates the left external carotid artery and its major branches to be widely patent. The left internal carotid artery at the bulb to the cranial skull base demonstrates wide patency. The petrous, cavernous and supraclinoid segments are widely patent. A small infundibulum is seen in the left posterior communicating artery region. The left middle cerebral artery and the left anterior cerebral artery opacify into the capillary and venous phases. 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 The angio findings were reviewed with patient and  the patient's daughter. Brought to their attention was the irregular right posterior-inferior cerebellar artery aneurysm, and the beaking effect at the distal aspect of the right vertebrobasilar junction probably representing the superior margin of the partially thrombosed large to giant aneurysm. Options mentioned to them were those of stent assisted coiling versus placement of a flow diversion within the left posterior inferior cerebellar artery, and/or across the origin of the left posterior-inferior cerebellar artery. The patient and daughter were agreeable to proceed with endovascular treatment. The patient was then put under general anesthesia by the Department of Anesthesiology. The diagnostic JB 1 catheter in the right subclavian artery distally was exchanged over a 0.035 inch 300 cm Rosen exchange guidewire for an 85 cm 8 [place] Neuron Max guide sheath. This was advanced with its distal  tip distal to the origin of the right vertebral artery. This was then connected to continuous heparinized saline infusion. Over the [employer] guidewire, a 5 French 115 cm Catalyst guide catheter was then advanced and positioned distal to the tip of the Neuron Max sheath. The guidewire was removed. Good aspiration obtained from the hub of the 5 [place] Catalyst guide catheter. The combination was then retrieved more proximally and proximal to the origin of the right vertebral artery. Using biplane roadmap technique and constant fluoroscopic guidance, a 0.035 inch Roadrunner guidewire was then advanced through the Catalyst guide catheter. Access into the right vertebral artery was obtained. The wire was advanced to the distal right vertebral artery followed by the Catalyst guide catheter. Also at this time, the 8 [place] Neuron Max guide sheath was advanced to the proximal 1/3 of the right vertebral artery. The guidewire was removed. Good aspiration obtained from the hub of the Catalyst guide catheter. A gentle control arteriogram performed through the Catalyst guide catheter demonstrated no evidence of spasms, dissections or of intraluminal filling defects. Arteriogram was then performed in the biplane DSA mode of the basilar artery. Measurements were performed of the right vertebral artery proximal to the right PICA, and distal to the right posterior-inferior cerebellar artery and the distal right vertebrobasilar junction. Again noted was a nubbin arising from the lateral superior wall of the right vertebrobasilar junction distal to the origin of the right posterior-inferior cerebellar artery. 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 An 027 Phenom 150 cm microcatheter was then advanced over a 0.014 inch Softip Synchro micro guidewire to the distal end of the Catalyst guide catheter. With the micro guidewire leading with a  J-tip configuration, access was obtained into the origin of the right posterior-inferior cerebellar artery. The Phenom microcatheter was then advanced to close proximity to the origin of the right posterior-inferior cerebellar artery. Using a torque device, the wire was then manipulated to advance passed the wide neck aneurysm of the right posterior-inferior cerebellar artery. Access distal to the neck of the aneurysm could not be achieved with the micro guidewire. Therefore, this was then abandoned. The Phenom microcatheter was then advanced to the distal basilar artery just proximal to the origin of the posterior cerebral arteries over a 0.014 inch Softip Synchro micro guidewire. The guidewire was removed. Good aspiration obtained from the hub of the Phenom 027 microcatheter. A gentle contrast injection demonstrated safe position of the tip of the microcatheter. This was then connected to continuous heparinized saline infusion. Following evaluation of the 3D images, and the diagnostic catheter arteriograms, it was decided to use a 4.5 mm x 35  mm pipeline flex flow diverter device across the neck of the right posterior-inferior cerebellar artery. This was then advanced in a coaxial manner and with constant heparinized saline infusion using biplane roadmap technique to the distal end of the tip of the Phenom microcatheter. The microcatheter was then retrieved unsheathing the distal wire tip of the device including a few mm of the device itself. Unsheathing was continued until the device started opening in its distal aspect. This was then retrieved more proximally to the distal landing zone which was just proximal to the entry point of the contralateral left vertebral artery. Once there, the remainder of the device was then deployed by advancing the microcatheter using the fluffing technique with the microcatheter to promote apposition with the native vessel. The Phenom microcatheter was then advanced over the micro  guidewire into the distal basilar artery. Using a torque device, the micro guidewire was then captured into the microcatheter and removed. The microcatheter was connected to continuous heparinized saline infusion. A control arteriogram performed through the 5 [place] Catalyst guide catheter in the distal right vertebral artery demonstrated excellent apposition proximally and distally with moderate slow flow through the right posterior-inferior cerebellar artery and filling of the aneurysm. Control arteriograms were then performed at 15 and 30 minutes post deployment of the initial device. These continued to demonstrate excellent apposition without evidence of intraluminal filling defects or of occlusions. However, there continued to be a modest jet like innominate along the inferior aspect of the aneurysm. This prompted the use of a second flow diverter device. It was decided to use a 4.5 mm x 20 mm pipeline flex flow diverter device. This was again advanced to the distal end of the Phenom 027 microcatheter using biplane roadmap technique and constant fluoroscopic guidance in a coaxial manner and with constant heparinized saline infusion. This in turn was then visualized by retrieving the microcatheter. Once the distal 3 mm of the device was opened, the combination was then retrieved such that the landing zone of this was going to be in the proximal horizontal segment just distal to the right posterior-inferior cerebellar artery. Once in that position,, again using the pushing technique with the micro guidewire to deploy and the fluffing technique to stabilize the device the combination was utilized to deploy the entire device. Once completely deployed, a control arteriogram performed through the Catalyst guide catheter in the distal right vertebral artery demonstrated excellent apposition proximally and distally. There was double coverage at the origin of the right posterior-inferior cerebellar artery. A control  arteriogram performed through the Catalyst guide catheter at 10 and 20 minutes post deployment of the second device continued to demonstrate excellent apposition with now slower flow in the region of the neck of the aneurysm. However, patency of the right posterior-inferior cerebellar artery continued to be maintained. A final control arteriogram performed through the 5 [place] Catalyst guide catheter in the right vertebral artery demonstrated excellent apposition without evidence of intraluminal filling defects or of occlusions, in the posterior cerebral circulation. Throughout the procedure, the patient's blood pressure and neurological status remained stable. The patient's ACT was in the region of 297. This prompted the use of approximately 15 mg of protamine sulfate to partially reverse the effect of the heparin. Thereafter, the 5 [place] Catalyst guide catheter, and the 8 [place] Neuron Max sheath were then retrieved into the abdominal aorta and exchanged over a J-tip guidewire for a 6 [place] Pinnacle sheath. This was then removed with hemostasis achieved manually in the right  groin at the puncture site over 30 minutes. The patient's general anesthesia was then reversed and the patient was then extubated without difficulty. Upon recovery the patient demonstrated no new neurological signs or symptoms. She did not complain of any nausea. She did complain of mild headache. She was able to move all her fours equally proportional to the effort. Distal pulses remained palpable in the dorsalis pedis and posterior tibial regions bilaterally at the end of the procedure. She was then transferred to the neuro ICU to continue on low-dose IV heparin, close neurologic observations, and maintaining her adequate blood pressure. The patient did develop oozing from her right groin whilst being transferred from the gurney to her bed. Pressure was held for about 15-20 minutes with hemostasis. A pressure bandage was applied. Overnight  the patient complained of nausea with mild headache. This responded to 12.5 mg of Phenergan rectal suppository. She was able to take in free liquids in small amounts. The following morning, the IV heparin was stopped, the patient was switched to aspirin 81 mg a day, and Plavix 75 mg a day. Again while being sat up the patient had small oozing through the right groin puncture site. Again pressure was held for about 10 minutes. The patient was to be laid flat for 2 hours. Also encouraged was for the patient to start eating and drinking. The patient's potassium was also 3.1. This was replaced with oral potassium. The patient and her daughter were advised that upon her discharge, the patient was to rest overnight lying flat. She could ambulate using a walker or a cane the following morning. She was advised to maintain adequate hydration by drinking water. She was strongly advised to continue taking her aspirin 81 mg a day, and Plavix. She was to avoid stooping, bending or lifting weights above 10 pounds for a couple of weeks. She was also advised not to drive for a couple of weeks. The daughter was informed that should there be a slight oozing in the right groin puncture site to hold maintained pressure for approximately 10-15 minutes and keep the patient flat. Should she develop new neurological signs or symptoms or worsening headaches, visual problems, motor weakness, to call 911. The patient will be seen in follow-up in outpatient clinic in 2 weeks post discharge. 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 PLAN: Follow-up in the clinic in 2 weeks time. Electronically Signed   By: Luanne Bras M.D.   On: 09/06/2018 11:50   Ir Angiogram Follow Up Study  Result Date: 09/07/2018 CLINICAL DATA:  Worsening headaches. 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 Past history of right occipital craniotomy. EXAM: ARTERIOGRAPHY; WORKSTATION 3D RECONSTRUCTION; TRANSCATHETER THERAPY EMBOLIZATION; IR ANGIO VERTEBRAL SEL VERTEBRAL UNI RIGHT MOD SED; BILATERAL COMMON CAROTID AND INNOMINATE ANGIOGRAPHY COMPARISON:  CT angiogram of the head and neck of 08/30/2018. MEDICATIONS: Heparin 4,000 units IV; Ancef 2 g IV was administered within 1 hour of the procedure. ANESTHESIA/SEDATION: Mac anesthesia for the diagnostic portion of the examination, and general anesthesia for the embolization portion of the examination by the department of Anesthesiology at [facility]:  Isovue 300 approximately 110 mL. FLUOROSCOPY TIME:  Fluoroscopy Time: 61 minutes 6 seconds (3786 mGy). COMPLICATIONS: None immediate. TECHNIQUE: Informed written consent was obtained from the patient after a thorough discussion of the procedural  risks, benefits and alternatives. All questions were addressed. Maximal Sterile Barrier Technique was utilized including caps, mask, sterile gowns, sterile gloves, sterile drape, hand hygiene and skin antiseptic. A timeout was performed prior to the initiation of the procedure. The right groin was prepped and draped in the usual sterile fashion. Thereafter using modified Seldinger technique, transfemoral access into the right common femoral artery was obtained without difficulty. Over a 0.035 inch guidewire, a 5 [place] Pinnacle sheath was inserted. Through this, and also over 0.035 inch guidewire, a 5 [place] JB 1 catheter was advanced to the aortic arch region and selectively positioned in the right common carotid artery, the right vertebral artery, and the left common carotid artery. Also performed was a 3D rotational arteriogram of the right posterior circulation via injection to the right vertebral artery. Reformations were then created on a separate workstation. Distal to this the right  vertebrobasilar junction demonstrates wide patency into the basilar artery. The basilar artery, the left posterior cerebral artery, the superior cerebellar arteries and the anterior-inferior cerebellar arteries opacify into the capillary and venous phases. FINDINGS: The right common carotid arteriogram demonstrates the right external carotid artery and its major branches to be widely patent. The right internal carotid artery at the bulb to the cranial skull base demonstrates wide patency. The petrous, cavernous and supraclinoid segments are widely patent. A right posterior communicating artery is seen opacifying the right posterior cerebral artery distribution. The right middle cerebral artery and the right anterior cerebral artery opacify into the capillary and venous phases. The dominant right vertebral artery origin is widely patent. The vessel is seen to opacify to the cranial skull base. There is mild caliber irregularity involving the distal right vertebral artery in the cervical segment. Distal to this the origin of the right posterior-inferior cerebellar artery is also patent. There is mild fusiform dilatation of the right vertebrobasilar junction adjacent to the origin of the right posterior-inferior cerebellar artery with a nubbin along the lateral superior wall. A similar less prominent indentation is noted more proximally. Arising from the proximal 1/3 of the right posterior-inferior cerebellar artery is a wide necked irregular approximately 6 mm x 5 mm aneurysm. Flash filling of the right posterior cerebral artery P1 is noted. Unopacified blood is also noted in the basilar artery from the contralateral vertebral artery. The left common carotid arteriogram demonstrates the left external carotid artery and its major branches to be widely patent. The left internal carotid artery at the bulb to the cranial skull base demonstrates wide patency. The petrous, cavernous and supraclinoid segments are widely  patent. A small infundibulum is seen in the left posterior communicating artery region. The left middle cerebral artery and the left anterior cerebral artery opacify into the capillary and venous phases. 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 The angio findings were reviewed with patient and the patient's daughter. Brought to their attention was the irregular right posterior-inferior cerebellar artery aneurysm, and the beaking effect at the distal aspect of the right vertebrobasilar junction probably representing the superior margin of the partially thrombosed large to giant aneurysm. Options mentioned to them were those of stent assisted coiling versus placement of a flow diversion within the left posterior inferior cerebellar artery, and/or across the origin of the left posterior-inferior cerebellar artery. The patient and daughter were agreeable to proceed with endovascular treatment. The patient was then put under general  anesthesia by the Department of Anesthesiology. The diagnostic JB 1 catheter in the right subclavian artery distally was exchanged over a 0.035 inch 300 cm Rosen exchange guidewire for an 85 cm 8 [place] Neuron Max guide sheath. This was advanced with its distal tip distal to the origin of the right vertebral artery. This was then connected to continuous heparinized saline infusion. Over the [employer] guidewire, a 5 French 115 cm Catalyst guide catheter was then advanced and positioned distal to the tip of the Neuron Max sheath. The guidewire was removed. Good aspiration obtained from the hub of the 5 [place] Catalyst guide catheter. The combination was then retrieved more proximally and proximal to the origin of the right vertebral artery. Using biplane roadmap technique and constant fluoroscopic guidance, a 0.035 inch Roadrunner guidewire was  then advanced through the Catalyst guide catheter. Access into the right vertebral artery was obtained. The wire was advanced to the distal right vertebral artery followed by the Catalyst guide catheter. Also at this time, the 8 [place] Neuron Max guide sheath was advanced to the proximal 1/3 of the right vertebral artery. The guidewire was removed. Good aspiration obtained from the hub of the Catalyst guide catheter. A gentle control arteriogram performed through the Catalyst guide catheter demonstrated no evidence of spasms, dissections or of intraluminal filling defects. Arteriogram was then performed in the biplane DSA mode of the basilar artery. Measurements were performed of the right vertebral artery proximal to the right PICA, and distal to the right posterior-inferior cerebellar artery and the distal right vertebrobasilar junction. Again noted was a nubbin arising from the lateral superior wall of the right vertebrobasilar junction distal to the origin of the right posterior-inferior cerebellar artery. 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 An 027 Phenom 150 cm microcatheter was then advanced over a 0.014 inch Softip Synchro micro guidewire to the distal end of the Catalyst guide catheter. With the micro guidewire leading with a J-tip configuration, access was obtained into the origin of the right posterior-inferior cerebellar artery. The Phenom microcatheter was then advanced to close proximity to the origin of the right posterior-inferior cerebellar artery. Using a torque device, the wire was then manipulated to advance passed the wide neck aneurysm of the right posterior-inferior cerebellar artery. Access distal to the neck of the aneurysm could not be achieved with the micro guidewire. Therefore, this was then abandoned. The Phenom microcatheter was then advanced to the distal basilar artery just proximal to the  origin of the posterior cerebral arteries over a 0.014 inch Softip Synchro micro guidewire. The guidewire was removed. Good aspiration obtained from the hub of the Phenom 027 microcatheter. A gentle contrast injection demonstrated safe position of the tip of the microcatheter. This was then connected to continuous heparinized saline infusion. Following evaluation of the 3D images, and the diagnostic catheter arteriograms, it was decided to use a 4.5 mm x 35 mm pipeline flex flow diverter device across the neck of the right posterior-inferior cerebellar artery. This was then advanced in a coaxial manner and with constant heparinized saline infusion using biplane roadmap technique to the distal end of the tip of the Phenom microcatheter. The microcatheter was then retrieved unsheathing the distal wire tip of the device including a few mm of the device itself. Unsheathing was continued until the device started opening in its distal aspect. This was then retrieved more proximally to the distal landing zone which was just proximal  to the entry point of the contralateral left vertebral artery. Once there, the remainder of the device was then deployed by advancing the microcatheter using the fluffing technique with the microcatheter to promote apposition with the native vessel. The Phenom microcatheter was then advanced over the micro guidewire into the distal basilar artery. Using a torque device, the micro guidewire was then captured into the microcatheter and removed. The microcatheter was connected to continuous heparinized saline infusion. A control arteriogram performed through the 5 [place] Catalyst guide catheter in the distal right vertebral artery demonstrated excellent apposition proximally and distally with moderate slow flow through the right posterior-inferior cerebellar artery and filling of the aneurysm. Control arteriograms were then performed at 15 and 30 minutes post deployment of the initial device.  These continued to demonstrate excellent apposition without evidence of intraluminal filling defects or of occlusions. However, there continued to be a modest jet like innominate along the inferior aspect of the aneurysm. This prompted the use of a second flow diverter device. It was decided to use a 4.5 mm x 20 mm pipeline flex flow diverter device. This was again advanced to the distal end of the Phenom 027 microcatheter using biplane roadmap technique and constant fluoroscopic guidance in a coaxial manner and with constant heparinized saline infusion. This in turn was then visualized by retrieving the microcatheter. Once the distal 3 mm of the device was opened, the combination was then retrieved such that the landing zone of this was going to be in the proximal horizontal segment just distal to the right posterior-inferior cerebellar artery. Once in that position,, again using the pushing technique with the micro guidewire to deploy and the fluffing technique to stabilize the device the combination was utilized to deploy the entire device. Once completely deployed, a control arteriogram performed through the Catalyst guide catheter in the distal right vertebral artery demonstrated excellent apposition proximally and distally. There was double coverage at the origin of the right posterior-inferior cerebellar artery. A control arteriogram performed through the Catalyst guide catheter at 10 and 20 minutes post deployment of the second device continued to demonstrate excellent apposition with now slower flow in the region of the neck of the aneurysm. However, patency of the right posterior-inferior cerebellar artery continued to be maintained. A final control arteriogram performed through the 5 [place] Catalyst guide catheter in the right vertebral artery demonstrated excellent apposition without evidence of intraluminal filling defects or of occlusions, in the posterior cerebral circulation. Throughout the  procedure, the patient's blood pressure and neurological status remained stable. The patient's ACT was in the region of 297. This prompted the use of approximately 15 mg of protamine sulfate to partially reverse the effect of the heparin. Thereafter, the 5 [place] Catalyst guide catheter, and the 8 [place] Neuron Max sheath were then retrieved into the abdominal aorta and exchanged over a J-tip guidewire for a 6 [place] Pinnacle sheath. This was then removed with hemostasis achieved manually in the right groin at the puncture site over 30 minutes. The patient's general anesthesia was then reversed and the patient was then extubated without difficulty. Upon recovery the patient demonstrated no new neurological signs or symptoms. She did not complain of any nausea. She did complain of mild headache. She was able to move all her fours equally proportional to the effort. Distal pulses remained palpable in the dorsalis pedis and posterior tibial regions bilaterally at the end of the procedure. She was then transferred to the neuro ICU to continue on low-dose IV  heparin, close neurologic observations, and maintaining her adequate blood pressure. The patient did develop oozing from her right groin whilst being transferred from the gurney to her bed. Pressure was held for about 15-20 minutes with hemostasis. A pressure bandage was applied. Overnight the patient complained of nausea with mild headache. This responded to 12.5 mg of Phenergan rectal suppository. She was able to take in free liquids in small amounts. The following morning, the IV heparin was stopped, the patient was switched to aspirin 81 mg a day, and Plavix 75 mg a day. Again while being sat up the patient had small oozing through the right groin puncture site. Again pressure was held for about 10 minutes. The patient was to be laid flat for 2 hours. Also encouraged was for the patient to start eating and drinking. The patient's potassium was also 3.1. This was  replaced with oral potassium. The patient and her daughter were advised that upon her discharge, the patient was to rest overnight lying flat. She could ambulate using a walker or a cane the following morning. She was advised to maintain adequate hydration by drinking water. She was strongly advised to continue taking her aspirin 81 mg a day, and Plavix. She was to avoid stooping, bending or lifting weights above 10 pounds for a couple of weeks. She was also advised not to drive for a couple of weeks. The daughter was informed that should there be a slight oozing in the right groin puncture site to hold maintained pressure for approximately 10-15 minutes and keep the patient flat. Should she develop new neurological signs or symptoms or worsening headaches, visual problems, motor weakness, to call 911. The patient will be seen in follow-up in outpatient clinic in 2 weeks post discharge. 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 PLAN: Follow-up in the clinic in 2 weeks time. Electronically Signed   By: Luanne Bras M.D.   On: 09/06/2018 11:50   Ir 3d Primit[facility] Gauze [facility]re[facility] Mclean  Result Date: 09/07/2018 CLINICAL DATA:  Worsening headaches. 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 Past history of right occipital craniotomy. EXAM: ARTERIOGRAPHY; WORKSTATION 3D RECONSTRUCTION; TRANSCATHETER THERAPY EMBOLIZATION; IR ANGIO VERTEBRAL SEL VERTEBRAL UNI RIGHT MOD SED; BILATERAL COMMON CAROTID AND INNOMINATE ANGIOGRAPHY COMPARISON:  CT angiogram of the head and neck of 08/30/2018. MEDICATIONS: Heparin 4,000 units IV; Ancef 2 g IV was administered within 1 hour of the procedure. ANESTHESIA/SEDATION: Mac anesthesia for the diagnostic portion of the examination, and general anesthesia for the embolization  portion of the examination by the department of Anesthesiology at [facility]:  Isovue 300 approximately 110 mL. FLUOROSCOPY TIME:  Fluoroscopy Time: 61 minutes 6 seconds (3786 mGy). COMPLICATIONS: None immediate. TECHNIQUE: Informed written consent was obtained from the patient after a thorough discussion of the procedural risks, benefits and alternatives. All questions were addressed. Maximal Sterile Barrier Technique was utilized including caps, mask, sterile gowns, sterile gloves, sterile drape, hand hygiene and skin antiseptic. A timeout was performed prior to the initiation of the procedure. The right groin was prepped and draped in the usual sterile fashion. Thereafter using modified Seldinger technique, transfemoral access into the right common femoral artery was obtained without difficulty. Over a 0.035 inch guidewire, a 5 [place] Pinnacle sheath was inserted. Through this, and also over 0.035 inch guidewire, a 5 [place] JB 1  catheter was advanced to the aortic arch region and selectively positioned in the right common carotid artery, the right vertebral artery, and the left common carotid artery. Also performed was a 3D rotational arteriogram of the right posterior circulation via injection to the right vertebral artery. Reformations were then created on a separate workstation. Distal to this the right vertebrobasilar junction demonstrates wide patency into the basilar artery. The basilar artery, the left posterior cerebral artery, the superior cerebellar arteries and the anterior-inferior cerebellar arteries opacify into the capillary and venous phases. FINDINGS: The right common carotid arteriogram demonstrates the right external carotid artery and its major branches to be widely patent. The right internal carotid artery at the bulb to the cranial skull base demonstrates wide patency. The petrous, cavernous and supraclinoid segments are widely patent. A right posterior communicating  artery is seen opacifying the right posterior cerebral artery distribution. The right middle cerebral artery and the right anterior cerebral artery opacify into the capillary and venous phases. The dominant right vertebral artery origin is widely patent. The vessel is seen to opacify to the cranial skull base. There is mild caliber irregularity involving the distal right vertebral artery in the cervical segment. Distal to this the origin of the right posterior-inferior cerebellar artery is also patent. There is mild fusiform dilatation of the right vertebrobasilar junction adjacent to the origin of the right posterior-inferior cerebellar artery with a nubbin along the lateral superior wall. A similar less prominent indentation is noted more proximally. Arising from the proximal 1/3 of the right posterior-inferior cerebellar artery is a wide necked irregular approximately 6 mm x 5 mm aneurysm. Flash filling of the right posterior cerebral artery P1 is noted. Unopacified blood is also noted in the basilar artery from the contralateral vertebral artery. The left common carotid arteriogram demonstrates the left external carotid artery and its major branches to be widely patent. The left internal carotid artery at the bulb to the cranial skull base demonstrates wide patency. The petrous, cavernous and supraclinoid segments are widely patent. A small infundibulum is seen in the left posterior communicating artery region. The left middle cerebral artery and the left anterior cerebral artery opacify into the capillary and venous phases. 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 The angio findings were reviewed with patient and the patient's daughter. Brought to their attention was the irregular right posterior-inferior cerebellar artery aneurysm, and the beaking effect at the distal  aspect of the right vertebrobasilar junction probably representing the superior margin of the partially thrombosed large to giant aneurysm. Options mentioned to them were those of stent assisted coiling versus placement of a flow diversion within the left posterior inferior cerebellar artery, and/or across the origin of the left posterior-inferior cerebellar artery. The patient and daughter were agreeable to proceed with endovascular treatment. The patient was then put under general anesthesia by the Department of Anesthesiology. The diagnostic JB 1 catheter in the right subclavian artery distally was exchanged over a 0.035 inch 300 cm Rosen exchange guidewire for an 85 cm 8 [place] Neuron Max guide sheath. This was advanced with its distal tip distal to the origin of the right vertebral artery. This was then connected to continuous heparinized saline infusion. Over the [employer] guidewire, a 5 French 115 cm Catalyst guide catheter was then advanced and positioned distal to the tip of the Neuron Max sheath. The guidewire was  removed. Good aspiration obtained from the hub of the 5 [place] Catalyst guide catheter. The combination was then retrieved more proximally and proximal to the origin of the right vertebral artery. Using biplane roadmap technique and constant fluoroscopic guidance, a 0.035 inch Roadrunner guidewire was then advanced through the Catalyst guide catheter. Access into the right vertebral artery was obtained. The wire was advanced to the distal right vertebral artery followed by the Catalyst guide catheter. Also at this time, the 8 [place] Neuron Max guide sheath was advanced to the proximal 1/3 of the right vertebral artery. The guidewire was removed. Good aspiration obtained from the hub of the Catalyst guide catheter. A gentle control arteriogram performed through the Catalyst guide catheter demonstrated no evidence of spasms, dissections or of intraluminal filling defects. Arteriogram was then  performed in the biplane DSA mode of the basilar artery. Measurements were performed of the right vertebral artery proximal to the right PICA, and distal to the right posterior-inferior cerebellar artery and the distal right vertebrobasilar junction. Again noted was a nubbin arising from the lateral superior wall of the right vertebrobasilar junction distal to the origin of the right posterior-inferior cerebellar artery. 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 An 027 Phenom 150 cm microcatheter was then advanced over a 0.014 inch Softip Synchro micro guidewire to the distal end of the Catalyst guide catheter. With the micro guidewire leading with a J-tip configuration, access was obtained into the origin of the right posterior-inferior cerebellar artery. The Phenom microcatheter was then advanced to close proximity to the origin of the right posterior-inferior cerebellar artery. Using a torque device, the wire was then manipulated to advance passed the wide neck aneurysm of the right posterior-inferior cerebellar artery. Access distal to the neck of the aneurysm could not be achieved with the micro guidewire. Therefore, this was then abandoned. The Phenom microcatheter was then advanced to the distal basilar artery just proximal to the origin of the posterior cerebral arteries over a 0.014 inch Softip Synchro micro guidewire. The guidewire was removed. Good aspiration obtained from the hub of the Phenom 027 microcatheter. A gentle contrast injection demonstrated safe position of the tip of the microcatheter. This was then connected to continuous heparinized saline infusion. Following evaluation of the 3D images, and the diagnostic catheter arteriograms, it was decided to use a 4.5 mm x 35 mm pipeline flex flow diverter device across the neck of the right posterior-inferior cerebellar artery. This was then advanced in a coaxial  manner and with constant heparinized saline infusion using biplane roadmap technique to the distal end of the tip of the Phenom microcatheter. The microcatheter was then retrieved unsheathing the distal wire tip of the device including a few mm of the device itself. Unsheathing was continued until the device started opening in its distal aspect. This was then retrieved more proximally to the distal landing zone which was just proximal to the entry point of the contralateral left vertebral artery. Once there, the remainder of the device was then deployed by advancing the microcatheter using the fluffing technique with the microcatheter to promote apposition with the native vessel. The Phenom microcatheter was then advanced over the micro guidewire into the distal basilar artery. Using a torque device, the micro guidewire was then captured into the microcatheter and removed. The microcatheter was connected to continuous heparinized saline infusion. A control arteriogram performed through the 5 [place] Catalyst guide catheter in the distal right vertebral  artery demonstrated excellent apposition proximally and distally with moderate slow flow through the right posterior-inferior cerebellar artery and filling of the aneurysm. Control arteriograms were then performed at 15 and 30 minutes post deployment of the initial device. These continued to demonstrate excellent apposition without evidence of intraluminal filling defects or of occlusions. However, there continued to be a modest jet like innominate along the inferior aspect of the aneurysm. This prompted the use of a second flow diverter device. It was decided to use a 4.5 mm x 20 mm pipeline flex flow diverter device. This was again advanced to the distal end of the Phenom 027 microcatheter using biplane roadmap technique and constant fluoroscopic guidance in a coaxial manner and with constant heparinized saline infusion. This in turn was then visualized by retrieving  the microcatheter. Once the distal 3 mm of the device was opened, the combination was then retrieved such that the landing zone of this was going to be in the proximal horizontal segment just distal to the right posterior-inferior cerebellar artery. Once in that position,, again using the pushing technique with the micro guidewire to deploy and the fluffing technique to stabilize the device the combination was utilized to deploy the entire device. Once completely deployed, a control arteriogram performed through the Catalyst guide catheter in the distal right vertebral artery demonstrated excellent apposition proximally and distally. There was double coverage at the origin of the right posterior-inferior cerebellar artery. A control arteriogram performed through the Catalyst guide catheter at 10 and 20 minutes post deployment of the second device continued to demonstrate excellent apposition with now slower flow in the region of the neck of the aneurysm. However, patency of the right posterior-inferior cerebellar artery continued to be maintained. A final control arteriogram performed through the 5 [place] Catalyst guide catheter in the right vertebral artery demonstrated excellent apposition without evidence of intraluminal filling defects or of occlusions, in the posterior cerebral circulation. Throughout the procedure, the patient's blood pressure and neurological status remained stable. The patient's ACT was in the region of 297. This prompted the use of approximately 15 mg of protamine sulfate to partially reverse the effect of the heparin. Thereafter, the 5 [place] Catalyst guide catheter, and the 8 [place] Neuron Max sheath were then retrieved into the abdominal aorta and exchanged over a J-tip guidewire for a 6 [place] Pinnacle sheath. This was then removed with hemostasis achieved manually in the right groin at the puncture site over 30 minutes. The patient's general anesthesia was then reversed and the patient  was then extubated without difficulty. Upon recovery the patient demonstrated no new neurological signs or symptoms. She did not complain of any nausea. She did complain of mild headache. She was able to move all her fours equally proportional to the effort. Distal pulses remained palpable in the dorsalis pedis and posterior tibial regions bilaterally at the end of the procedure. She was then transferred to the neuro ICU to continue on low-dose IV heparin, close neurologic observations, and maintaining her adequate blood pressure. The patient did develop oozing from her right groin whilst being transferred from the gurney to her bed. Pressure was held for about 15-20 minutes with hemostasis. A pressure bandage was applied. Overnight the patient complained of nausea with mild headache. This responded to 12.5 mg of Phenergan rectal suppository. She was able to take in free liquids in small amounts. The following morning, the IV heparin was stopped, the patient was switched to aspirin 81 mg a day, and Plavix 75 mg  a day. Again while being sat up the patient had small oozing through the right groin puncture site. Again pressure was held for about 10 minutes. The patient was to be laid flat for 2 hours. Also encouraged was for the patient to start eating and drinking. The patient's potassium was also 3.1. This was replaced with oral potassium. The patient and her daughter were advised that upon her discharge, the patient was to rest overnight lying flat. She could ambulate using a walker or a cane the following morning. She was advised to maintain adequate hydration by drinking water. She was strongly advised to continue taking her aspirin 81 mg a day, and Plavix. She was to avoid stooping, bending or lifting weights above 10 pounds for a couple of weeks. She was also advised not to drive for a couple of weeks. The daughter was informed that should there be a slight oozing in the right groin puncture site to hold  maintained pressure for approximately 10-15 minutes and keep the patient flat. Should she develop new neurological signs or symptoms or worsening headaches, visual problems, motor weakness, to call 911. The patient will be seen in follow-up in outpatient clinic in 2 weeks post discharge. 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 PLAN: Follow-up in the clinic in 2 weeks time. Electronically Signed   By: Luanne Bras M.D.   On: 09/06/2018 11:50   Ir Angio Intra Extracran Sel Com Carotid Innominate Bilat Mod Sed  Result Date: 09/07/2018 CLINICAL DATA:  Worsening headaches. 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 Past history of right occipital craniotomy. EXAM: ARTERIOGRAPHY; WORKSTATION 3D RECONSTRUCTION; TRANSCATHETER THERAPY EMBOLIZATION; IR ANGIO VERTEBRAL SEL VERTEBRAL UNI RIGHT MOD SED; BILATERAL COMMON CAROTID AND INNOMINATE ANGIOGRAPHY COMPARISON:  CT angiogram of the head and neck of 08/30/2018. MEDICATIONS: Heparin 4,000 units IV; Ancef 2 g IV was administered within 1 hour of the procedure. ANESTHESIA/SEDATION: Mac anesthesia for the diagnostic portion of the examination, and general anesthesia for the embolization portion of the examination by the department of Anesthesiology at [place]:  Isovue 300 approximately 110 mL. FLUOROSCOPY TIME:  Fluoroscopy Time: 61 minutes 6 seconds (3786 mGy). COMPLICATIONS: None immediate. TECHNIQUE: Informed written consent was obtained from the patient after a thorough discussion of the procedural risks, benefits and alternatives. All questions were addressed. Maximal Sterile Barrier Technique was utilized including caps, mask, sterile gowns, sterile gloves, sterile drape, hand hygiene and skin antiseptic. A timeout was  performed prior to the initiation of the procedure. The right groin was prepped and draped in the usual sterile fashion. Thereafter using modified Seldinger technique, transfemoral access into the right common femoral artery was obtained without difficulty. Over a 0.035 inch guidewire, a 5 [place] Pinnacle sheath was inserted. Through this, and also over 0.035 inch guidewire, a 5 [place] JB 1 catheter was advanced to the aortic arch region and selectively positioned in the right common carotid artery, the right vertebral artery, and the left common carotid artery. Also performed was a 3D rotational arteriogram of the right posterior circulation via injection to the right vertebral artery. Reformations were then created on a separate workstation. Distal to this the right vertebrobasilar junction demonstrates wide patency into the basilar artery. The basilar artery, the left posterior cerebral artery, the superior cerebellar arteries and the anterior-inferior cerebellar arteries opacify  into the capillary and venous phases. FINDINGS: The right common carotid arteriogram demonstrates the right external carotid artery and its major branches to be widely patent. The right internal carotid artery at the bulb to the cranial skull base demonstrates wide patency. The petrous, cavernous and supraclinoid segments are widely patent. A right posterior communicating artery is seen opacifying the right posterior cerebral artery distribution. The right middle cerebral artery and the right anterior cerebral artery opacify into the capillary and venous phases. The dominant right vertebral artery origin is widely patent. The vessel is seen to opacify to the cranial skull base. There is mild caliber irregularity involving the distal right vertebral artery in the cervical segment. Distal to this the origin of the right posterior-inferior cerebellar artery is also patent. There is mild fusiform dilatation of the right vertebrobasilar  junction adjacent to the origin of the right posterior-inferior cerebellar artery with a nubbin along the lateral superior wall. A similar less prominent indentation is noted more proximally. Arising from the proximal 1/3 of the right posterior-inferior cerebellar artery is a wide necked irregular approximately 6 mm x 5 mm aneurysm. Flash filling of the right posterior cerebral artery P1 is noted. Unopacified blood is also noted in the basilar artery from the contralateral vertebral artery. The left common carotid arteriogram demonstrates the left external carotid artery and its major branches to be widely patent. The left internal carotid artery at the bulb to the cranial skull base demonstrates wide patency. The petrous, cavernous and supraclinoid segments are widely patent. A small infundibulum is seen in the left posterior communicating artery region. The left middle cerebral artery and the left anterior cerebral artery opacify into the capillary and venous phases. 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 The angio findings were reviewed with patient and the patient's daughter. Brought to their attention was the irregular right posterior-inferior cerebellar artery aneurysm, and the beaking effect at the distal aspect of the right vertebrobasilar junction probably representing the superior margin of the partially thrombosed large to giant aneurysm. Options mentioned to them were those of stent assisted coiling versus placement of a flow diversion within the left posterior inferior cerebellar artery, and/or across the origin of the left posterior-inferior cerebellar artery. The patient and daughter were agreeable to proceed with endovascular treatment. The patient was then put under general anesthesia by the Department of Anesthesiology. The diagnostic JB 1 catheter in the  right subclavian artery distally was exchanged over a 0.035 inch 300 cm Rosen exchange guidewire for an 85 cm 8 [place] Neuron Max guide sheath. This was advanced with its distal tip distal to the origin of the right vertebral artery. This was then connected to continuous heparinized saline infusion. Over the [employer] guidewire, a 5 French 115 cm Catalyst guide catheter was then advanced and positioned distal to the tip of the Neuron Max sheath. The guidewire was removed. Good aspiration obtained from the hub of the 5 [place] Catalyst guide catheter. The combination was then retrieved more proximally and proximal to the origin of the right vertebral artery. Using biplane roadmap technique and constant fluoroscopic guidance, a 0.035 inch Roadrunner guidewire was then advanced through the Catalyst guide catheter. Access into the right vertebral artery was obtained. The wire was advanced to the distal right vertebral artery followed by the Catalyst guide catheter. Also at this time, the 8 [place] Neuron Max guide  sheath was advanced to the proximal 1/3 of the right vertebral artery. The guidewire was removed. Good aspiration obtained from the hub of the Catalyst guide catheter. A gentle control arteriogram performed through the Catalyst guide catheter demonstrated no evidence of spasms, dissections or of intraluminal filling defects. Arteriogram was then performed in the biplane DSA mode of the basilar artery. Measurements were performed of the right vertebral artery proximal to the right PICA, and distal to the right posterior-inferior cerebellar artery and the distal right vertebrobasilar junction. Again noted was a nubbin arising from the lateral superior wall of the right vertebrobasilar junction distal to the origin of the right posterior-inferior cerebellar artery. 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 An 027 Phenom 150  cm microcatheter was then advanced over a 0.014 inch Softip Synchro micro guidewire to the distal end of the Catalyst guide catheter. With the micro guidewire leading with a J-tip configuration, access was obtained into the origin of the right posterior-inferior cerebellar artery. The Phenom microcatheter was then advanced to close proximity to the origin of the right posterior-inferior cerebellar artery. Using a torque device, the wire was then manipulated to advance passed the wide neck aneurysm of the right posterior-inferior cerebellar artery. Access distal to the neck of the aneurysm could not be achieved with the micro guidewire. Therefore, this was then abandoned. The Phenom microcatheter was then advanced to the distal basilar artery just proximal to the origin of the posterior cerebral arteries over a 0.014 inch Softip Synchro micro guidewire. The guidewire was removed. Good aspiration obtained from the hub of the Phenom 027 microcatheter. A gentle contrast injection demonstrated safe position of the tip of the microcatheter. This was then connected to continuous heparinized saline infusion. Following evaluation of the 3D images, and the diagnostic catheter arteriograms, it was decided to use a 4.5 mm x 35 mm pipeline flex flow diverter device across the neck of the right posterior-inferior cerebellar artery. This was then advanced in a coaxial manner and with constant heparinized saline infusion using biplane roadmap technique to the distal end of the tip of the Phenom microcatheter. The microcatheter was then retrieved unsheathing the distal wire tip of the device including a few mm of the device itself. Unsheathing was continued until the device started opening in its distal aspect. This was then retrieved more proximally to the distal landing zone which was just proximal to the entry point of the contralateral left vertebral artery. Once there, the remainder of the device was then deployed by advancing  the microcatheter using the fluffing technique with the microcatheter to promote apposition with the native vessel. The Phenom microcatheter was then advanced over the micro guidewire into the distal basilar artery. Using a torque device, the micro guidewire was then captured into the microcatheter and removed. The microcatheter was connected to continuous heparinized saline infusion. A control arteriogram performed through the 5 [place] Catalyst guide catheter in the distal right vertebral artery demonstrated excellent apposition proximally and distally with moderate slow flow through the right posterior-inferior cerebellar artery and filling of the aneurysm. Control arteriograms were then performed at 15 and 30 minutes post deployment of the initial device. These continued to demonstrate excellent apposition without evidence of intraluminal filling defects or of occlusions. However, there continued to be a modest jet like innominate along the inferior aspect of the aneurysm. This prompted the use of a second flow diverter device. It was decided to use a  4.5 mm x 20 mm pipeline flex flow diverter device. This was again advanced to the distal end of the Phenom 027 microcatheter using biplane roadmap technique and constant fluoroscopic guidance in a coaxial manner and with constant heparinized saline infusion. This in turn was then visualized by retrieving the microcatheter. Once the distal 3 mm of the device was opened, the combination was then retrieved such that the landing zone of this was going to be in the proximal horizontal segment just distal to the right posterior-inferior cerebellar artery. Once in that position,, again using the pushing technique with the micro guidewire to deploy and the fluffing technique to stabilize the device the combination was utilized to deploy the entire device. Once completely deployed, a control arteriogram performed through the Catalyst guide catheter in the distal right  vertebral artery demonstrated excellent apposition proximally and distally. There was double coverage at the origin of the right posterior-inferior cerebellar artery. A control arteriogram performed through the Catalyst guide catheter at 10 and 20 minutes post deployment of the second device continued to demonstrate excellent apposition with now slower flow in the region of the neck of the aneurysm. However, patency of the right posterior-inferior cerebellar artery continued to be maintained. A final control arteriogram performed through the 5 [place] Catalyst guide catheter in the right vertebral artery demonstrated excellent apposition without evidence of intraluminal filling defects or of occlusions, in the posterior cerebral circulation. Throughout the procedure, the patient's blood pressure and neurological status remained stable. The patient's ACT was in the region of 297. This prompted the use of approximately 15 mg of protamine sulfate to partially reverse the effect of the heparin. Thereafter, the 5 [place] Catalyst guide catheter, and the 8 [place] Neuron Max sheath were then retrieved into the abdominal aorta and exchanged over a J-tip guidewire for a 6 [place] Pinnacle sheath. This was then removed with hemostasis achieved manually in the right groin at the puncture site over 30 minutes. The patient's general anesthesia was then reversed and the patient was then extubated without difficulty. Upon recovery the patient demonstrated no new neurological signs or symptoms. She did not complain of any nausea. She did complain of mild headache. She was able to move all her fours equally proportional to the effort. Distal pulses remained palpable in the dorsalis pedis and posterior tibial regions bilaterally at the end of the procedure. She was then transferred to the neuro ICU to continue on low-dose IV heparin, close neurologic observations, and maintaining her adequate blood pressure. The patient did develop  oozing from her right groin whilst being transferred from the gurney to her bed. Pressure was held for about 15-20 minutes with hemostasis. A pressure bandage was applied. Overnight the patient complained of nausea with mild headache. This responded to 12.5 mg of Phenergan rectal suppository. She was able to take in free liquids in small amounts. The following morning, the IV heparin was stopped, the patient was switched to aspirin 81 mg a day, and Plavix 75 mg a day. Again while being sat up the patient had small oozing through the right groin puncture site. Again pressure was held for about 10 minutes. The patient was to be laid flat for 2 hours. Also encouraged was for the patient to start eating and drinking. The patient's potassium was also 3.1. This was replaced with oral potassium. The patient and her daughter were advised that upon her discharge, the patient was to rest overnight lying flat. She could ambulate using a walker or a  cane the following morning. She was advised to maintain adequate hydration by drinking water. She was strongly advised to continue taking her aspirin 81 mg a day, and Plavix. She was to avoid stooping, bending or lifting weights above 10 pounds for a couple of weeks. She was also advised not to drive for a couple of weeks. The daughter was informed that should there be a slight oozing in the right groin puncture site to hold maintained pressure for approximately 10-15 minutes and keep the patient flat. Should she develop new neurological signs or symptoms or worsening headaches, visual problems, motor weakness, to call 911. The patient will be seen in follow-up in outpatient clinic in 2 weeks post discharge. 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 PLAN: Follow-up in the clinic in 2 weeks time. Electronically  Signed   By: Luanne Bras M.D.   On: 09/06/2018 11:50   Ir Angio Vertebral Sel Vertebral Uni R Mod Sed  Result Date: 09/07/2018 CLINICAL DATA:  Worsening headaches. 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 Past history of right occipital craniotomy. EXAM: ARTERIOGRAPHY; WORKSTATION 3D RECONSTRUCTION; TRANSCATHETER THERAPY EMBOLIZATION; IR ANGIO VERTEBRAL SEL VERTEBRAL UNI RIGHT MOD SED; BILATERAL COMMON CAROTID AND INNOMINATE ANGIOGRAPHY COMPARISON:  CT angiogram of the head and neck of 08/30/2018. MEDICATIONS: Heparin 4,000 units IV; Ancef 2 g IV was administered within 1 hour of the procedure. ANESTHESIA/SEDATION: Mac anesthesia for the diagnostic portion of the examination, and general anesthesia for the embolization portion of the examination by the department of Anesthesiology at [place]:  Isovue 300 approximately 110 mL. FLUOROSCOPY TIME:  Fluoroscopy Time: 61 minutes 6 seconds (3786 mGy). COMPLICATIONS: None immediate. TECHNIQUE: Informed written consent was obtained from the patient after a thorough discussion of the procedural risks, benefits and alternatives. All questions were addressed. Maximal Sterile Barrier Technique was utilized including caps, mask, sterile gowns, sterile gloves, sterile drape, hand hygiene and skin antiseptic. A timeout was performed prior to the initiation of the procedure. The right groin was prepped and draped in the usual sterile fashion. Thereafter using modified Seldinger technique, transfemoral access into the right common femoral artery was obtained without difficulty. Over a 0.035 inch guidewire, a 5 French Pinnacle sheath was inserted. Through this, and also over 0.035 inch guidewire, a 5 [place] JB 1 catheter was advanced to the aortic arch region and selectively positioned in the right common carotid artery, the right vertebral artery, and the left common carotid artery. Also performed was  a 3D rotational arteriogram of the right posterior circulation via injection to the right vertebral artery. Reformations were then created on a separate workstation. Distal to this the right vertebrobasilar junction demonstrates wide patency into the basilar artery. The basilar artery, the left posterior cerebral artery, the superior cerebellar arteries and the anterior-inferior cerebellar arteries opacify into the capillary and venous phases. FINDINGS: The right common carotid arteriogram demonstrates the right external carotid artery and its major branches to be widely patent. The right internal carotid artery at the bulb to the cranial skull base demonstrates wide patency. The petrous, cavernous and supraclinoid segments are widely patent. A right posterior communicating artery is seen opacifying the right posterior cerebral artery distribution. The right middle cerebral artery and the right anterior cerebral artery opacify into the capillary and venous phases. The dominant right vertebral artery  origin is widely patent. The vessel is seen to opacify to the cranial skull base. There is mild caliber irregularity involving the distal right vertebral artery in the cervical segment. Distal to this the origin of the right posterior-inferior cerebellar artery is also patent. There is mild fusiform dilatation of the right vertebrobasilar junction adjacent to the origin of the right posterior-inferior cerebellar artery with a nubbin along the lateral superior wall. A similar less prominent indentation is noted more proximally. Arising from the proximal 1/3 of the right posterior-inferior cerebellar artery is a wide necked irregular approximately 6 mm x 5 mm aneurysm. Flash filling of the right posterior cerebral artery P1 is noted. Unopacified blood is also noted in the basilar artery from the contralateral vertebral artery. The left common carotid arteriogram demonstrates the left external carotid artery and its major  branches to be widely patent. The left internal carotid artery at the bulb to the cranial skull base demonstrates wide patency. The petrous, cavernous and supraclinoid segments are widely patent. A small infundibulum is seen in the left posterior communicating artery region. The left middle cerebral artery and the left anterior cerebral artery opacify into the capillary and venous phases. 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 The angio findings were reviewed with patient and the patient's daughter. Brought to their attention was the irregular right posterior-inferior cerebellar artery aneurysm, and the beaking effect at the distal aspect of the right vertebrobasilar junction probably representing the superior margin of the partially thrombosed large to giant aneurysm. Options mentioned to them were those of stent assisted coiling versus placement of a flow diversion within the left posterior inferior cerebellar artery, and/or across the origin of the left posterior-inferior cerebellar artery. The patient and daughter were agreeable to proceed with endovascular treatment. The patient was then put under general anesthesia by the Department of Anesthesiology. The diagnostic JB 1 catheter in the right subclavian artery distally was exchanged over a 0.035 inch 300 cm Rosen exchange guidewire for an 85 cm 8 [place] Neuron Max guide sheath. This was advanced with its distal tip distal to the origin of the right vertebral artery. This was then connected to continuous heparinized saline infusion. Over the [employer] guidewire, a 5 French 115 cm Catalyst guide catheter was then advanced and positioned distal to the tip of the Neuron Max sheath. The guidewire was removed. Good aspiration obtained from the hub of the 5 [place] Catalyst guide catheter. The combination was then  retrieved more proximally and proximal to the origin of the right vertebral artery. Using biplane roadmap technique and constant fluoroscopic guidance, a 0.035 inch Roadrunner guidewire was then advanced through the Catalyst guide catheter. Access into the right vertebral artery was obtained. The wire was advanced to the distal right vertebral artery followed by the Catalyst guide catheter. Also at this time, the 8 [place] Neuron Max guide sheath was advanced to the proximal 1/3 of the right vertebral artery. The guidewire was removed. Good aspiration obtained from the hub of the Catalyst guide catheter. A gentle control arteriogram performed through the Catalyst guide catheter demonstrated no evidence of spasms, dissections or of intraluminal filling defects. Arteriogram was then performed in the biplane DSA mode of the basilar artery. Measurements were performed of the right vertebral artery proximal to the right PICA, and distal to the right posterior-inferior cerebellar artery and the distal right vertebrobasilar junction.  Again noted was a nubbin arising from the lateral superior wall of the right vertebrobasilar junction distal to the origin of the right posterior-inferior cerebellar artery. 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 An 027 Phenom 150 cm microcatheter was then advanced over a 0.014 inch Softip Synchro micro guidewire to the distal end of the Catalyst guide catheter. With the micro guidewire leading with a J-tip configuration, access was obtained into the origin of the right posterior-inferior cerebellar artery. The Phenom microcatheter was then advanced to close proximity to the origin of the right posterior-inferior cerebellar artery. Using a torque device, the wire was then manipulated to advance passed the wide neck aneurysm of the right posterior-inferior cerebellar artery. Access distal to the neck of the  aneurysm could not be achieved with the micro guidewire. Therefore, this was then abandoned. The Phenom microcatheter was then advanced to the distal basilar artery just proximal to the origin of the posterior cerebral arteries over a 0.014 inch Softip Synchro micro guidewire. The guidewire was removed. Good aspiration obtained from the hub of the Phenom 027 microcatheter. A gentle contrast injection demonstrated safe position of the tip of the microcatheter. This was then connected to continuous heparinized saline infusion. Following evaluation of the 3D images, and the diagnostic catheter arteriograms, it was decided to use a 4.5 mm x 35 mm pipeline flex flow diverter device across the neck of the right posterior-inferior cerebellar artery. This was then advanced in a coaxial manner and with constant heparinized saline infusion using biplane roadmap technique to the distal end of the tip of the Phenom microcatheter. The microcatheter was then retrieved unsheathing the distal wire tip of the device including a few mm of the device itself. Unsheathing was continued until the device started opening in its distal aspect. This was then retrieved more proximally to the distal landing zone which was just proximal to the entry point of the contralateral left vertebral artery. Once there, the remainder of the device was then deployed by advancing the microcatheter using the fluffing technique with the microcatheter to promote apposition with the native vessel. The Phenom microcatheter was then advanced over the micro guidewire into the distal basilar artery. Using a torque device, the micro guidewire was then captured into the microcatheter and removed. The microcatheter was connected to continuous heparinized saline infusion. A control arteriogram performed through the 5 [place] Catalyst guide catheter in the distal right vertebral artery demonstrated excellent apposition proximally and distally with moderate slow flow  through the right posterior-inferior cerebellar artery and filling of the aneurysm. Control arteriograms were then performed at 15 and 30 minutes post deployment of the initial device. These continued to demonstrate excellent apposition without evidence of intraluminal filling defects or of occlusions. However, there continued to be a modest jet like innominate along the inferior aspect of the aneurysm. This prompted the use of a second flow diverter device. It was decided to use a 4.5 mm x 20 mm pipeline flex flow diverter device. This was again advanced to the distal end of the Phenom 027 microcatheter using biplane roadmap technique and constant fluoroscopic guidance in a coaxial manner and with constant heparinized saline infusion. This in turn was then visualized by retrieving the microcatheter. Once the distal 3 mm of the device was opened, the combination was then retrieved such that the landing zone of this was going to be in the proximal horizontal segment just distal to the right posterior-inferior  cerebellar artery. Once in that position,, again using the pushing technique with the micro guidewire to deploy and the fluffing technique to stabilize the device the combination was utilized to deploy the entire device. Once completely deployed, a control arteriogram performed through the Catalyst guide catheter in the distal right vertebral artery demonstrated excellent apposition proximally and distally. There was double coverage at the origin of the right posterior-inferior cerebellar artery. A control arteriogram performed through the Catalyst guide catheter at 10 and 20 minutes post deployment of the second device continued to demonstrate excellent apposition with now slower flow in the region of the neck of the aneurysm. However, patency of the right posterior-inferior cerebellar artery continued to be maintained. A final control arteriogram performed through the 5 [place] Catalyst guide catheter in the  right vertebral artery demonstrated excellent apposition without evidence of intraluminal filling defects or of occlusions, in the posterior cerebral circulation. Throughout the procedure, the patient's blood pressure and neurological status remained stable. The patient's ACT was in the region of 297. This prompted the use of approximately 15 mg of protamine sulfate to partially reverse the effect of the heparin. Thereafter, the 5 [place] Catalyst guide catheter, and the 8 [place] Neuron Max sheath were then retrieved into the abdominal aorta and exchanged over a J-tip guidewire for a 6 [place] Pinnacle sheath. This was then removed with hemostasis achieved manually in the right groin at the puncture site over 30 minutes. The patient's general anesthesia was then reversed and the patient was then extubated without difficulty. Upon recovery the patient demonstrated no new neurological signs or symptoms. She did not complain of any nausea. She did complain of mild headache. She was able to move all her fours equally proportional to the effort. Distal pulses remained palpable in the dorsalis pedis and posterior tibial regions bilaterally at the end of the procedure. She was then transferred to the neuro ICU to continue on low-dose IV heparin, close neurologic observations, and maintaining her adequate blood pressure. The patient did develop oozing from her right groin whilst being transferred from the gurney to her bed. Pressure was held for about 15-20 minutes with hemostasis. A pressure bandage was applied. Overnight the patient complained of nausea with mild headache. This responded to 12.5 mg of Phenergan rectal suppository. She was able to take in free liquids in small amounts. The following morning, the IV heparin was stopped, the patient was switched to aspirin 81 mg a day, and Plavix 75 mg a day. Again while being sat up the patient had small oozing through the right groin puncture site. Again pressure was held  for about 10 minutes. The patient was to be laid flat for 2 hours. Also encouraged was for the patient to start eating and drinking. The patient's potassium was also 3.1. This was replaced with oral potassium. The patient and her daughter were advised that upon her discharge, the patient was to rest overnight lying flat. She could ambulate using a walker or a cane the following morning. She was advised to maintain adequate hydration by drinking water. She was strongly advised to continue taking her aspirin 81 mg a day, and Plavix. She was to avoid stooping, bending or lifting weights above 10 pounds for a couple of weeks. She was also advised not to drive for a couple of weeks. The daughter was informed that should there be a slight oozing in the right groin puncture site to hold maintained pressure for approximately 10-15 minutes and keep the patient flat.  Should she develop new neurological signs or symptoms or worsening headaches, visual problems, motor weakness, to call 911. The patient will be seen in follow-up in outpatient clinic in 2 weeks post discharge. 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 PLAN: Follow-up in the clinic in 2 weeks time. Electronically Signed   By: Luanne Bras M.D.   On: 09/06/2018 11:50    Labs:  CBC: Recent Labs    09/05/18 0615 09/06/18 0530  WBC 6.1 11.8*  HGB 13.4 10.9*  HCT 43.9 34.1*  PLT 266 236    COAGS: Recent Labs    09/05/18 0615  INR 0.99  APTT 31    BMP: Recent Labs    08/30/18 0956 09/05/18 0615 09/06/18 0530  NA  --  141 143  K  --  3.4* 3.1*  CL  --  105 110  CO2  --  26 24  GLUCOSE  --  89 104*  BUN  --  17 9  CALCIUM  --  9.6 8.0*  CREATININE 0.70 0.86 0.75  GFRNONAA  --  >60 >60  GFRAA  --  >60 >60    LIVER FUNCTION TESTS: No results for input(s):  BILITOT, AST, ALT, ALKPHOS, PROT, ALBUMIN in the last 8760 hours.  TUMOR MARKERS: No results for input(s): AFPTM, CEA, CA199, CHROMGRNA in the last 8760 hours.  Assessment and Plan:  Right PICA aneurysm s/p embolization using #2 pipeline flow diverter devices 09/05/2018 by Dr. Estanislado Pandy. Reviewed imaging with patient and family member. Brought to their attention was patient's right PICA aneurysm, both prior to and following treatment. Explained that the best course of management for her aneurysm at this time is with routine imaging scans to monitor for changes.  Discussed patient's symptoms. Explained that patient's right posterior headache is probably due to inflammation changes from her procedure. However, her right parietal/frontal headache is not related to her aneurysm. Recommended patient follow-up with PCP regarding management of headaches.  Will obtain P2Y12 today to check effectiveness of Plavix. Informed patient that I will call her if medication changes are indicated based on this test result.  Instructed patient to call 911 immediately if she notices any stroke symptoms, including weakness/numbness/tingling of one side of the body, facial droop, or vision changes.  Plan for follow-up MRI/MRA brain/head (without contrast) 3-4 months from procedure 09/05/2018. Informed patient that our schedulers will call her to set up this imaging scan. Instructed patient to continue taking Plavix 75 mg once daily and Aspirin 81 mg once daily.  All questions answered and concerns addressed. Patient and family member convey understanding and agree with plan.  Thank you for this interesting consult.  I greatly enjoyed meeting Elaine Rivera and look forward to participating in their care.  A copy of this report was sent to the requesting provider on this date.  Electronically Signed: Earley Abide, PA-C 09/19/2018, 9:59 AM   I spent a total of 40 Minutes in face to face in clinical  consultation, greater than 50% of which was counseling/coordinating care for right PICA aneurysm s/p embolization.

## 2018-09-30 DIAGNOSIS — S0086XA Insect bite (nonvenomous) of other part of head, initial encounter: Secondary | ICD-10-CM | POA: Diagnosis not present

## 2018-10-03 ENCOUNTER — Observation Stay (HOSPITAL_COMMUNITY)
Admission: EM | Admit: 2018-10-03 | Discharge: 2018-10-04 | Disposition: A | Discharge status: Home / Self Care | Payer: PPO | Attending: Internal Medicine | Admitting: Internal Medicine

## 2018-10-03 ENCOUNTER — Emergency Department (HOSPITAL_COMMUNITY): Payer: PPO

## 2018-10-03 ENCOUNTER — Encounter (HOSPITAL_COMMUNITY): Payer: Self-pay | Admitting: *Deleted

## 2018-10-03 DIAGNOSIS — R21 Rash and other nonspecific skin eruption: Secondary | ICD-10-CM | POA: Diagnosis not present

## 2018-10-03 DIAGNOSIS — Z7982 Long term (current) use of aspirin: Secondary | ICD-10-CM | POA: Insufficient documentation

## 2018-10-03 DIAGNOSIS — F329 Major depressive disorder, single episode, unspecified: Secondary | ICD-10-CM | POA: Diagnosis not present

## 2018-10-03 DIAGNOSIS — I671 Cerebral aneurysm, nonruptured: Secondary | ICD-10-CM

## 2018-10-03 DIAGNOSIS — Q282 Arteriovenous malformation of cerebral vessels: Secondary | ICD-10-CM | POA: Diagnosis not present

## 2018-10-03 DIAGNOSIS — B028 Zoster with other complications: Secondary | ICD-10-CM | POA: Diagnosis not present

## 2018-10-03 DIAGNOSIS — Z85828 Personal history of other malignant neoplasm of skin: Secondary | ICD-10-CM | POA: Diagnosis not present

## 2018-10-03 DIAGNOSIS — Z79899 Other long term (current) drug therapy: Secondary | ICD-10-CM | POA: Diagnosis not present

## 2018-10-03 DIAGNOSIS — L0889 Other specified local infections of the skin and subcutaneous tissue: Secondary | ICD-10-CM | POA: Diagnosis not present

## 2018-10-03 DIAGNOSIS — L03211 Cellulitis of face: Principal | ICD-10-CM | POA: Diagnosis present

## 2018-10-03 DIAGNOSIS — R51 Headache: Secondary | ICD-10-CM | POA: Diagnosis not present

## 2018-10-03 DIAGNOSIS — B009 Herpesviral infection, unspecified: Secondary | ICD-10-CM | POA: Diagnosis present

## 2018-10-03 DIAGNOSIS — B029 Zoster without complications: Secondary | ICD-10-CM | POA: Insufficient documentation

## 2018-10-03 DIAGNOSIS — I10 Essential (primary) hypertension: Secondary | ICD-10-CM | POA: Diagnosis not present

## 2018-10-03 DIAGNOSIS — I720 Aneurysm of carotid artery: Secondary | ICD-10-CM | POA: Diagnosis not present

## 2018-10-03 LAB — CBC WITH DIFFERENTIAL/PLATELET
ABS IMMATURE GRANULOCYTES: 0.02 10*3/uL (ref 0.00–0.07)
Basophils Absolute: 0 10*3/uL (ref 0.0–0.1)
Basophils Relative: 1 %
Eosinophils Absolute: 0.1 10*3/uL (ref 0.0–0.5)
Eosinophils Relative: 2 %
HCT: 38.8 % (ref 36.0–46.0)
Hemoglobin: 12.1 g/dL (ref 12.0–15.0)
Immature Granulocytes: 0 %
LYMPHS ABS: 1.1 10*3/uL (ref 0.7–4.0)
Lymphocytes Relative: 19 %
MCH: 28.7 pg (ref 26.0–34.0)
MCHC: 31.2 g/dL (ref 30.0–36.0)
MCV: 91.9 fL (ref 80.0–100.0)
Monocytes Absolute: 0.7 10*3/uL (ref 0.1–1.0)
Monocytes Relative: 11 %
Neutro Abs: 3.8 10*3/uL (ref 1.7–7.7)
Neutrophils Relative %: 67 %
Platelets: 240 10*3/uL (ref 150–400)
RBC: 4.22 MIL/uL (ref 3.87–5.11)
RDW: 12.4 % (ref 11.5–15.5)
WBC: 5.7 10*3/uL (ref 4.0–10.5)
nRBC: 0 % (ref 0.0–0.2)

## 2018-10-03 LAB — COMPREHENSIVE METABOLIC PANEL
ALT: 17 U/L (ref 0–44)
AST: 20 U/L (ref 15–41)
Albumin: 3.8 g/dL (ref 3.5–5.0)
Alkaline Phosphatase: 83 U/L (ref 38–126)
Anion gap: 10 (ref 5–15)
BUN: 12 mg/dL (ref 8–23)
CHLORIDE: 102 mmol/L (ref 98–111)
CO2: 26 mmol/L (ref 22–32)
Calcium: 9.4 mg/dL (ref 8.9–10.3)
Creatinine, Ser: 0.76 mg/dL (ref 0.44–1.00)
GFR calc Af Amer: >60 mL/min (ref >60)
GFR calc non Af Amer: >60 mL/min (ref >60)
Glucose, Bld: 91 mg/dL (ref 70–99)
Potassium: 3.7 mmol/L (ref 3.5–5.1)
Sodium: 138 mmol/L (ref 135–145)
Total Bilirubin: 0.8 mg/dL (ref 0.3–1.2)
Total Protein: 6.9 g/dL (ref 6.5–8.1)

## 2018-10-03 LAB — I-STAT CG4 LACTIC ACID, ED: Lactic Acid, Venous: 0.93 mmol/L (ref 0.5–1.9)

## 2018-10-03 MED ORDER — NAPROXEN 250 MG PO TABS
375.0000 mg | ORAL_TABLET | Freq: Once | ORAL | Status: AC
  Administered 2018-10-03: 375 mg via ORAL
  Filled 2018-10-03: qty 2

## 2018-10-03 MED ORDER — QUETIAPINE FUMARATE 50 MG PO TABS
25.0000 mg | ORAL_TABLET | Freq: Every day | ORAL | Status: DC
  Administered 2018-10-03: 25 mg via ORAL
  Filled 2018-10-03: qty 1

## 2018-10-03 MED ORDER — DULOXETINE HCL 60 MG PO CPEP
120.0000 mg | ORAL_CAPSULE | Freq: Every day | ORAL | Status: DC
  Administered 2018-10-03: 120 mg via ORAL
  Filled 2018-10-03: qty 2

## 2018-10-03 MED ORDER — TETRACAINE HCL 0.5 % OP SOLN
2.0000 [drp] | Freq: Once | OPHTHALMIC | Status: AC
  Administered 2018-10-03: 2 [drp] via OPHTHALMIC
  Filled 2018-10-03: qty 4

## 2018-10-03 MED ORDER — CLOPIDOGREL BISULFATE 75 MG PO TABS
75.0000 mg | ORAL_TABLET | Freq: Every evening | ORAL | Status: DC
  Administered 2018-10-03 – 2018-10-04 (×2): 75 mg via ORAL
  Filled 2018-10-03 (×2): qty 1

## 2018-10-03 MED ORDER — ONDANSETRON HCL 4 MG/2ML IJ SOLN: 4.0000 mg | Freq: Four times a day (QID) | INTRAMUSCULAR | Status: DC | PRN

## 2018-10-03 MED ORDER — SODIUM CHLORIDE 0.9% FLUSH: 3.0000 mL | INTRAVENOUS | Status: DC | PRN

## 2018-10-03 MED ORDER — ONDANSETRON HCL 4 MG PO TABS: 4.0000 mg | ORAL_TABLET | Freq: Four times a day (QID) | ORAL | Status: DC | PRN

## 2018-10-03 MED ORDER — PIPERACILLIN-TAZOBACTAM 3.375 G IVPB
3.3750 g | Freq: Three times a day (TID) | INTRAVENOUS | Status: DC
  Administered 2018-10-03 – 2018-10-04 (×3): 3.375 g via INTRAVENOUS
  Filled 2018-10-03: qty 50

## 2018-10-03 MED ORDER — ACETAMINOPHEN 325 MG PO TABS
650.0000 mg | ORAL_TABLET | Freq: Four times a day (QID) | ORAL | Status: DC | PRN
  Administered 2018-10-03 – 2018-10-04 (×2): 650 mg via ORAL
  Filled 2018-10-03 (×2): qty 2

## 2018-10-03 MED ORDER — IOPAMIDOL (ISOVUE-370) INJECTION 76%
100.0000 mL | Freq: Once | INTRAVENOUS | Status: AC | PRN
  Administered 2018-10-03: 100 mL via INTRAVENOUS

## 2018-10-03 MED ORDER — VANCOMYCIN HCL IN DEXTROSE 1-5 GM/200ML-% IV SOLN
1000.0000 mg | Freq: Once | INTRAVENOUS | Status: AC
  Administered 2018-10-03: 1000 mg via INTRAVENOUS
  Filled 2018-10-03: qty 200

## 2018-10-03 MED ORDER — FLUORESCEIN SODIUM 1 MG OP STRP
1.0000 | ORAL_STRIP | Freq: Once | OPHTHALMIC | Status: AC
  Administered 2018-10-03: 1 via OPHTHALMIC
  Filled 2018-10-03: qty 1

## 2018-10-03 MED ORDER — IOPAMIDOL (ISOVUE-370) INJECTION 76%
INTRAVENOUS | Status: AC
  Filled 2018-10-03: qty 100

## 2018-10-03 MED ORDER — ALPRAZOLAM 0.5 MG PO TABS
0.5000 mg | ORAL_TABLET | Freq: Every day | ORAL | Status: DC | PRN
  Administered 2018-10-03: 0.5 mg via ORAL
  Filled 2018-10-03: qty 1

## 2018-10-03 MED ORDER — SODIUM CHLORIDE 0.9% FLUSH
3.0000 mL | Freq: Two times a day (BID) | INTRAVENOUS | Status: DC
  Administered 2018-10-04: 3 mL via INTRAVENOUS

## 2018-10-03 MED ORDER — SODIUM CHLORIDE 0.9 % IV SOLN: 250.0000 mL | INTRAVENOUS | Status: DC | PRN

## 2018-10-03 MED ORDER — VANCOMYCIN HCL IN DEXTROSE 750-5 MG/150ML-% IV SOLN
750.0000 mg | INTRAVENOUS | Status: DC
  Filled 2018-10-03: qty 150

## 2018-10-03 MED ORDER — ENOXAPARIN SODIUM 40 MG/0.4ML ~~LOC~~ SOLN
40.0000 mg | SUBCUTANEOUS | Status: DC
  Administered 2018-10-03: 40 mg via SUBCUTANEOUS
  Filled 2018-10-03: qty 0.4

## 2018-10-03 MED ORDER — ASPIRIN EC 81 MG PO TBEC
81.0000 mg | DELAYED_RELEASE_TABLET | Freq: Every day | ORAL | Status: DC
  Administered 2018-10-03 – 2018-10-04 (×2): 81 mg via ORAL
  Filled 2018-10-03 (×2): qty 1

## 2018-10-03 MED ORDER — DEXTROSE 5 % IV SOLN
500.0000 mg | Freq: Three times a day (TID) | INTRAVENOUS | Status: DC
  Administered 2018-10-04 (×2): 500 mg via INTRAVENOUS
  Filled 2018-10-03 (×5): qty 10

## 2018-10-03 NOTE — ED Notes (Addendum)
Pt states that Tylenol does not work for her pain and she normally takes Advil; RN advised she will contact MD for new order but patient excepted the Tylenol at this time; Pt given portable cell phone to call family-Monique,RN

## 2018-10-03 NOTE — ED Notes (Signed)
Pt c/o of sever HA and continues to put on the call light after being told several times RN has to wait for MD to call back; RN has paged MD for [facility]

## 2018-10-03 NOTE — ED Triage Notes (Signed)
Pt in c/o R eye pain and rash is present, denies visual changes, swelling present, rash appears as possible shingles presentation, pt does not having oozing present, crusting over in areas are present, pt c/o posterior ha since aneurysm sx here x 3 wks ago per pt, pt A&O x4

## 2018-10-03 NOTE — ED Notes (Signed)
Patient transported to CT 

## 2018-10-03 NOTE — H&P (Signed)
[facility]                                                                                    Patient Demographics  Elaine Rivera, is a 73 y.o. female  CSN: [id number]  MRN: [id number]  DOB - 19-Jun-1946  Admit Date - 10/03/2018  Outpatient Primary MD for the patient is Carol Ada, MD   With History of -  Past Medical History:  Diagnosis Date  . Anemia    years ago  . Arthritis   . Basal cell carcinoma of eye    biopsy of left eye/ non cancerous  . Complication of anesthesia    nausea/vomiting  . Depression   . GERD (gastroesophageal reflux disease)   . History of kidney stones   . Hypertension    no longer on medications      Past Surgical History:  Procedure Laterality Date  . CEREBRAL ANEURYSM REPAIR     1998  . COLONOSCOPY    . ECTOPIC PREGNANCY SURGERY     x2   . ESOPHAGOGASTRODUODENOSCOPY N/A 04/22/2016   Procedure: ESOPHAGOGASTRODUODENOSCOPY (EGD);  Surgeon: Juanita Craver, MD;  Location: [facility];  Service: Endoscopy;  Laterality: N/A;  . IR 3D INDEPENDENT WKST  09/05/2018  . IR ANGIO INTRA EXTRACRAN SEL COM CAROTID INNOMINATE BILAT MOD SED  09/05/2018  . IR ANGIO VERTEBRAL SEL VERTEBRAL UNI R MOD SED  09/05/2018  . IR ANGIOGRAM FOLLOW UP STUDY  09/05/2018  . IR TRANSCATH/EMBOLIZ  09/05/2018  . PARATHYROIDECTOMY     2002  . RADIOLOGY WITH ANESTHESIA N/A 09/05/2018   Procedure: EMBOLIZATION;  Surgeon: Radiologist, Medication, MD;  Location: [place];  Service: Radiology;  Laterality: N/A;  . TONSILLECTOMY     1953  . VAGINAL HYSTERECTOMY     2001  . VOCAL CORD LATERALIZATION, ENDOSCOPIC APPROACH W/ MLB     2007 (@[facility] )  . WRIST SURGERY     left side/2008  . WRIST SURGERY     right side/ 2002    in for   Chief Complaint  Patient presents with  . Headache  . Herpes Zoster     HPI  Elaine Rivera  is a 73 y.o. female, with past medical history significant left thigh basal cell carcinoma and cerebral AVM in addition to  hypertension presenting today with 1 week history of right facial lesion that progressed to involve most of the D2 dermatome with involvment of the lower eyelid.  No fever or chills.  The lesion started as a single vesicular lesion on the right cheekbone and progressed to be scabbed and purulent.  Was in the emergency room 4 days ago and she was started on p.o. amoxicillin however the symptoms did not improve significantly.  Patient reports gnawing pain in the right cheek.  No fever or chills.    Review of Systems    In addition to the HPI above,  No Fever-chills, No Headache, No changes with Vision or hearing, No problems swallowing food or Liquids, No Chest pain, Cough or Shortness of Breath, No Abdominal pain, No Nausea or Vommitting, Bowel movements are regular, No Blood in stool or Urine, No dysuria, No  new skin rashes or bruises, No new joints pains-aches,  No new weakness, tingling, numbness in any extremity, No recent weight gain or loss, No polyuria, polydypsia or polyphagia, No significant Mental Stressors.  A full 10 point Review of Systems was done, except as stated above, all other Review of Systems were negative.   Social History Social History   Tobacco Use  . Smoking status: Never Smoker  . Smokeless tobacco: Never Used  Substance Use Topics  . Alcohol use: No     Family History No family history on file.   Prior to Admission medications   Medication Sig Start Date End Date Taking? Authorizing Provider  ALPRAZolam Duanne Moron) 0.5 MG tablet Take 0.5 mg by mouth daily as needed for anxiety.   Yes [provider]  amoxicillin-clavulanate (AUGMENTIN) 875-125 MG tablet Take 1 tablet by mouth 2 (two) times daily.   Yes [provider]  aspirin EC 81 MG tablet Take 81 mg by mouth daily.    Yes [provider]  Calcium Carbonate-Vitamin D (CALCIUM 600+D PO) Take 1 tablet by mouth at bedtime.    Yes [provider]  cholecalciferol  (VITAMIN D3) 25 MCG (1000 UT) tablet Take 1,000 Units by mouth at bedtime.    Yes [provider]  clopidogrel (PLAVIX) 75 MG tablet Take 75 mg by mouth every evening.   Yes [provider]  DULoxetine (CYMBALTA) 60 MG capsule Take 120 mg by mouth at bedtime.   Yes [provider]  naproxen sodium (ALEVE) 220 MG tablet Take 440 mg by mouth 2 (two) times daily as needed (pain).   Yes [provider]  QUEtiapine (SEROQUEL) 25 MG tablet Take 25 mg by mouth at bedtime.   Yes [provider]  triamcinolone cream (KENALOG) 0.1 % Apply 1 application topically 3 (three) times daily. Apply to the rash on face   Yes [provider]    Allergies  Allergen Reactions  . Sulfa Antibiotics Other (See Comments)    Reaction:  Unknown     Physical Exam  Vitals  Blood pressure 139/88, pulse 86, temperature 97.6 F (36.4 C), temperature source Oral, resp. rate 16, SpO2 96 %.   1. General well-developed, well-nourished, no acute distress  2. Normal affect and insight, Not Suicidal or Homicidal, Awake Alert, Oriented X 3.  3. No F.N deficits, grossly, patient moving all extremities.  4.  HEENT: Right cheekbone cellulitis, with crust on top, right eye mildly injected.  See pictures from progress note of Mr. Caryl Asp.  5. Supple Neck, No JVD, No cervical lymphadenopathy appriciated, No Carotid Bruits.  6. Symmetrical Chest wall movement, Good air movement bilaterally, CTAB.  7. RRR, No Gallops, Rubs or Murmurs, No Parasternal Heave.  8. Positive Bowel Sounds, Abdomen Soft, Non tender, .  9.  No Cyanosis, Normal Skin Turgor, No Skin Rash or Bruise.  10. Good muscle tone,  joints appear normal , no effusions, Normal ROM.    Data Review  CBC Recent Labs  Lab 10/03/18 1303  WBC 5.7  HGB 12.1  HCT 38.8  PLT 240  MCV 91.9  MCH 28.7  MCHC 31.2  RDW 12.4  LYMPHSABS 1.1  MONOABS 0.7  EOSABS 0.1  BASOSABS 0.0    ------------------------------------------------------------------------------------------------------------------  Chemistries  Recent Labs  Lab 10/03/18 1303  NA 138  K 3.7  CL 102  CO2 26  GLUCOSE 91  BUN 12  CREATININE 0.76  CALCIUM 9.4  AST 20  ALT 17  ALKPHOS  83  BILITOT 0.8   ------------------------------------------------------------------------------------------------------------------ CrCl cannot be calculated (Unknown ideal weight.). ------------------------------------------------------------------------------------------------------------------ No results for input(s): TSH, T4TOTAL, T3FREE, THYROIDAB in the last 72 hours.  Invalid input(s): FREET3   Coagulation profile No results for input(s): INR, PROTIME in the last 168 hours. ------------------------------------------------------------------------------------------------------------------- No results for input(s): DDIMER in the last 72 hours. -------------------------------------------------------------------------------------------------------------------  Cardiac Enzymes No results for input(s): CKMB, TROPONINI, MYOGLOBIN in the last 168 hours.  Invalid input(s): CK ------------------------------------------------------------------------------------------------------------------ Invalid input(s): POCBNP   ---------------------------------------------------------------------------------------------------------------  Urinalysis No results found for: COLORURINE, APPEARANCEUR, LABSPEC, PHURINE, GLUCOSEU, HGBUR, BILIRUBINUR, KETONESUR, PROTEINUR, UROBILINOGEN, NITRITE, LEUKOCYTESUR  ----------------------------------------------------------------------------------------------------------------   Imaging results:   Ct Angio Head W Or Wo Contrast  Result Date: 10/03/2018 CLINICAL DATA:  Patient has a persistent headache following aneurysm embolization. Patient also has infection to her face along  with sore throat. Looking for possible infection spreading to soft tissues of the neck. Status post endovascular treatment of wide neck PICA aneurysm with telescoping pipeline diverting stents. EXAM: CT ANGIOGRAPHY HEAD AND NECK TECHNIQUE: Multidetector CT imaging of the head and neck was performed using the standard protocol during bolus administration of intravenous contrast. Multiplanar CT image reconstructions and MIPs were obtained to evaluate the vascular anatomy. Carotid stenosis measurements (when applicable) are obtained utilizing NASCET criteria, using the distal internal carotid diameter as the denominator. CONTRAST:  188m ISOVUE-370 IOPAMIDOL (ISOVUE-370) INJECTION 76% COMPARISON:  Cerebral angiogram with endovascular stent placement 09/05/2018. CT head 09/20/2012. CTA head neck 08/30/2018. FINDINGS: CT HEAD FINDINGS Brain: No evidence for acute infarction, hemorrhage, mass lesion, or extra-axial fluid. Hydrocephalus, up lifted corpus callosum, hypoattenuating white matter and effaced cortical sulci all features of normal pressure hydrocephalus. The thrombosed hyperdense portion of the aneurysm is smaller, now 15 x 11 mm cross-section as seen on series 5, image 7 as compared with previous cross-section of 16 x 13 mm. There is less mass effect on the brainstem and fourth ventricle. Vascular: Reported separately. Skull: Previous suboccipital craniectomy to the RIGHT. Sinuses: Retention cysts. Orbits: Negative Review of the MIP images confirms the above findings CTA NECK FINDINGS Aortic arch: Standard branching. Imaged portion shows no evidence of aneurysm or dissection. No significant stenosis of the major arch vessel origins. Aortic atherosclerosis. Right carotid system: No evidence of dissection, stenosis (50% or greater) or occlusion. Left carotid system: No evidence of dissection, stenosis (50% or greater) or occlusion. Vertebral arteries: Mildly dominant RIGHT vertebral. Nonstenotic plaque at the  LEFT vertebral ostium. Skeleton: Spondylosis. Other neck: Asymmetric medialization RIGHT vocal cord is redemonstrated. Generalized prominence of BILATERAL level 2 lymph nodes, likely reactive. Partial LEFT thyroidectomy. Upper chest: Apical scarring. No mass or pneumothorax. Review of the MIP images confirms the above findings CTA HEAD FINDINGS Anterior circulation: No significant stenosis, proximal occlusion, aneurysm, or vascular malformation. Posterior circulation: Dominant RIGHT vertebral artery has been treated with pipeline devices and remains widely patent. Wide neck PICA aneurysm is redemonstrated, not enlarged, and no associated subarachnoid blood. Measurements of the residual aneurysmal filling are 6 x 6 x 6 mm. Venous sinuses: As permitted by contrast timing, patent. Anatomic variants: Hypoplastic RIGHT P1 segment. No branch occlusion. Delayed phase: The post infusion appearance of the thrombosed portion of the aneurysm yields a cross-section of 16 x 15. No abnormal intracranial enhancement. Review of the MIP images confirms the above findings IMPRESSION: 1. Satisfactory appearance status post RIGHT vertebral artery pipeline devices and PICA aneurysm coiling. 2. No extracranial or intracranial stenosis of significance. 3. Decreased size of the thrombosed aneurysm appearance on  noncontrast scan, now 15 x 11 mm cross-section. The portion of the PICA aneurysm which fills is unchanged, 6 x 6 x 6 mm. 4. No acute intracranial findings are evident. Specifically no subarachnoid hemorrhage. 5. Other than reactive lymphadenopathy, no concerning findings in the neck of abscess or abnormal fluid collection. 6. Findings reviewed with Dr. Estanislado Pandy, who concurs. Electronically Signed   By: Staci Righter M.D.   On: 10/03/2018 16:25   Dg Chest 2 View  Result Date: 10/03/2018 CLINICAL DATA:  Facial infection EXAM: CHEST - 2 VIEW COMPARISON:  09/20/2012 FINDINGS: Cardiac shadow is stable. Aortic calcifications are again  seen. The lungs are well aerated bilaterally. Stable elevation of left hemidiaphragm is noted with blunting of left costophrenic angle similar to that seen on prior exam. Hiatal hernia is noted as well. No bony abnormality is seen aside from the known stable scoliosis. IMPRESSION: Chronic changes in the left lung base in part due to elevation of the left hemidiaphragm as well as hiatal hernia. No acute abnormality is noted. Electronically Signed   By: Inez Catalina M.D.   On: 10/03/2018 14:53   Ct Angio Neck W And/or Wo Contrast  Result Date: 10/03/2018 CLINICAL DATA:  Patient has a persistent headache following aneurysm embolization. Patient also has infection to her face along with sore throat. Looking for possible infection spreading to soft tissues of the neck. Status post endovascular treatment of wide neck PICA aneurysm with telescoping pipeline diverting stents. EXAM: CT ANGIOGRAPHY HEAD AND NECK TECHNIQUE: Multidetector CT imaging of the head and neck was performed using the standard protocol during bolus administration of intravenous contrast. Multiplanar CT image reconstructions and MIPs were obtained to evaluate the vascular anatomy. Carotid stenosis measurements (when applicable) are obtained utilizing NASCET criteria, using the distal internal carotid diameter as the denominator. CONTRAST:  148m ISOVUE-370 IOPAMIDOL (ISOVUE-370) INJECTION 76% COMPARISON:  Cerebral angiogram with endovascular stent placement 09/05/2018. CT head 09/20/2012. CTA head neck 08/30/2018. FINDINGS: CT HEAD FINDINGS Brain: No evidence for acute infarction, hemorrhage, mass lesion, or extra-axial fluid. Hydrocephalus, up lifted corpus callosum, hypoattenuating white matter and effaced cortical sulci all features of normal pressure hydrocephalus. The thrombosed hyperdense portion of the aneurysm is smaller, now 15 x 11 mm cross-section as seen on series 5, image 7 as compared with previous cross-section of 16 x 13 mm. There is  less mass effect on the brainstem and fourth ventricle. Vascular: Reported separately. Skull: Previous suboccipital craniectomy to the RIGHT. Sinuses: Retention cysts. Orbits: Negative Review of the MIP images confirms the above findings CTA NECK FINDINGS Aortic arch: Standard branching. Imaged portion shows no evidence of aneurysm or dissection. No significant stenosis of the major arch vessel origins. Aortic atherosclerosis. Right carotid system: No evidence of dissection, stenosis (50% or greater) or occlusion. Left carotid system: No evidence of dissection, stenosis (50% or greater) or occlusion. Vertebral arteries: Mildly dominant RIGHT vertebral. Nonstenotic plaque at the LEFT vertebral ostium. Skeleton: Spondylosis. Other neck: Asymmetric medialization RIGHT vocal cord is redemonstrated. Generalized prominence of BILATERAL level 2 lymph nodes, likely reactive. Partial LEFT thyroidectomy. Upper chest: Apical scarring. No mass or pneumothorax. Review of the MIP images confirms the above findings CTA HEAD FINDINGS Anterior circulation: No significant stenosis, proximal occlusion, aneurysm, or vascular malformation. Posterior circulation: Dominant RIGHT vertebral artery has been treated with pipeline devices and remains widely patent. Wide neck PICA aneurysm is redemonstrated, not enlarged, and no associated subarachnoid blood. Measurements of the residual aneurysmal filling are 6 x 6 x 6 mm.  Venous sinuses: As permitted by contrast timing, patent. Anatomic variants: Hypoplastic RIGHT P1 segment. No branch occlusion. Delayed phase: The post infusion appearance of the thrombosed portion of the aneurysm yields a cross-section of 16 x 15. No abnormal intracranial enhancement. Review of the MIP images confirms the above findings IMPRESSION: 1. Satisfactory appearance status post RIGHT vertebral artery pipeline devices and PICA aneurysm coiling. 2. No extracranial or intracranial stenosis of significance. 3. Decreased  size of the thrombosed aneurysm appearance on noncontrast scan, now 15 x 11 mm cross-section. The portion of the PICA aneurysm which fills is unchanged, 6 x 6 x 6 mm. 4. No acute intracranial findings are evident. Specifically no subarachnoid hemorrhage. 5. Other than reactive lymphadenopathy, no concerning findings in the neck of abscess or abnormal fluid collection. 6. Findings reviewed with Dr. Estanislado Pandy, who concurs. Electronically Signed   By: Staci Righter M.D.   On: 10/03/2018 16:25   Ir Transcath/emboliz  Result Date: 09/07/2018 CLINICAL DATA:  Worsening headaches. 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 Past history of right occipital craniotomy. EXAM: ARTERIOGRAPHY; WORKSTATION 3D RECONSTRUCTION; TRANSCATHETER THERAPY EMBOLIZATION; IR ANGIO VERTEBRAL SEL VERTEBRAL UNI RIGHT MOD SED; BILATERAL COMMON CAROTID AND INNOMINATE ANGIOGRAPHY COMPARISON:  CT angiogram of the head and neck of 08/30/2018. MEDICATIONS: Heparin 4,000 units IV; Ancef 2 g IV was administered within 1 hour of the procedure. ANESTHESIA/SEDATION: Mac anesthesia for the diagnostic portion of the examination, and general anesthesia for the embolization portion of the examination by the department of Anesthesiology at [facility]:  Isovue 300 approximately 110 mL. FLUOROSCOPY TIME:  Fluoroscopy Time: 61 minutes 6 seconds (3786 mGy). COMPLICATIONS: None immediate. TECHNIQUE: Informed written consent was obtained from the patient after a thorough discussion of the procedural risks, benefits and alternatives. All questions were addressed. Maximal Sterile Barrier Technique was utilized including caps, mask, sterile gowns, sterile gloves, sterile drape, hand hygiene and skin antiseptic. A timeout was performed prior to the initiation of the procedure. The right groin was prepped and draped in the usual sterile fashion. Thereafter using modified Seldinger technique,  transfemoral access into the right common femoral artery was obtained without difficulty. Over a 0.035 inch guidewire, a 5 French Pinnacle sheath was inserted. Through this, and also over 0.035 inch guidewire, a 5 [place] JB 1 catheter was advanced to the aortic arch region and selectively positioned in the right common carotid artery, the right vertebral artery, and the left common carotid artery. Also performed was a 3D rotational arteriogram of the right posterior circulation via injection to the right vertebral artery. Reformations were then created on a separate workstation. Distal to this the right vertebrobasilar junction demonstrates wide patency into the basilar artery. The basilar artery, the left posterior cerebral artery, the superior cerebellar arteries and the anterior-inferior cerebellar arteries opacify into the capillary and venous phases. FINDINGS: The right common carotid arteriogram demonstrates the right external carotid artery and its major branches to be widely patent. The right internal carotid artery at the bulb to the cranial skull base demonstrates wide patency. The petrous, cavernous and supraclinoid segments are widely patent. A right posterior communicating artery is seen opacifying the right posterior cerebral artery distribution. The right middle cerebral artery and the right anterior cerebral artery opacify into the capillary and venous phases. The dominant right vertebral artery origin is widely patent. The vessel is seen to opacify to the cranial skull base. There is mild caliber irregularity involving the distal right vertebral artery in  the cervical segment. Distal to this the origin of the right posterior-inferior cerebellar artery is also patent. There is mild fusiform dilatation of the right vertebrobasilar junction adjacent to the origin of the right posterior-inferior cerebellar artery with a nubbin along the lateral superior wall. A similar less prominent indentation is  noted more proximally. Arising from the proximal 1/3 of the right posterior-inferior cerebellar artery is a wide necked irregular approximately 6 mm x 5 mm aneurysm. Flash filling of the right posterior cerebral artery P1 is noted. Unopacified blood is also noted in the basilar artery from the contralateral vertebral artery. The left common carotid arteriogram demonstrates the left external carotid artery and its major branches to be widely patent. The left internal carotid artery at the bulb to the cranial skull base demonstrates wide patency. The petrous, cavernous and supraclinoid segments are widely patent. A small infundibulum is seen in the left posterior communicating artery region. The left middle cerebral artery and the left anterior cerebral artery opacify into the capillary and venous phases. 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 The angio findings were reviewed with patient and the patient's daughter. Brought to their attention was the irregular right posterior-inferior cerebellar artery aneurysm, and the beaking effect at the distal aspect of the right vertebrobasilar junction probably representing the superior margin of the partially thrombosed large to giant aneurysm. Options mentioned to them were those of stent assisted coiling versus placement of a flow diversion within the left posterior inferior cerebellar artery, and/or across the origin of the left posterior-inferior cerebellar artery. The patient and daughter were agreeable to proceed with endovascular treatment. The patient was then put under general anesthesia by the Department of Anesthesiology. The diagnostic JB 1 catheter in the right subclavian artery distally was exchanged over a 0.035 inch 300 cm Rosen exchange guidewire for an 85 cm 8 [place] Neuron Max guide sheath. This was advanced with  its distal tip distal to the origin of the right vertebral artery. This was then connected to continuous heparinized saline infusion. Over the [employer] guidewire, a 5 French 115 cm Catalyst guide catheter was then advanced and positioned distal to the tip of the Neuron Max sheath. The guidewire was removed. Good aspiration obtained from the hub of the 5 [place] Catalyst guide catheter. The combination was then retrieved more proximally and proximal to the origin of the right vertebral artery. Using biplane roadmap technique and constant fluoroscopic guidance, a 0.035 inch Roadrunner guidewire was then advanced through the Catalyst guide catheter. Access into the right vertebral artery was obtained. The wire was advanced to the distal right vertebral artery followed by the Catalyst guide catheter. Also at this time, the 8 [place] Neuron Max guide sheath was advanced to the proximal 1/3 of the right vertebral artery. The guidewire was removed. Good aspiration obtained from the hub of the Catalyst guide catheter. A gentle control arteriogram performed through the Catalyst guide catheter demonstrated no evidence of spasms, dissections or of intraluminal filling defects. Arteriogram was then performed in the biplane DSA mode of the basilar artery. Measurements were performed of the right vertebral artery proximal to the right PICA, and distal to the right posterior-inferior cerebellar artery and the distal right vertebrobasilar junction. Again noted was a nubbin arising from the lateral superior wall of the right vertebrobasilar junction distal to the origin of the right posterior-inferior cerebellar artery. Again  identified was the irregular lobulated 6 mm x 5 mm right posterior-inferior cerebellar artery aneurysm arising proximally. An 027 Phenom 150 cm microcatheter was then advanced over a 0.014 inch Softip Synchro micro guidewire to the distal end of the Catalyst guide catheter. With the micro guidewire leading  with a J-tip configuration, access was obtained into the origin of the right posterior-inferior cerebellar artery. The Phenom microcatheter was then advanced to close proximity to the origin of the right posterior-inferior cerebellar artery. Using a torque device, the wire was then manipulated to advance passed the wide neck aneurysm of the right posterior-inferior cerebellar artery. Access distal to the neck of the aneurysm could not be achieved with the micro guidewire. Therefore, this was then abandoned. The Phenom microcatheter was then advanced to the distal basilar artery just proximal to the origin of the posterior cerebral arteries over a 0.014 inch Softip Synchro micro guidewire. The guidewire was removed. Good aspiration obtained from the hub of the Phenom 027 microcatheter. A gentle contrast injection demonstrated safe position of the tip of the microcatheter. This was then connected to continuous heparinized saline infusion. Following evaluation of the 3D images, and the diagnostic catheter arteriograms, it was decided to use a 4.5 mm x 35 mm pipeline flex flow diverter device across the neck of the right posterior-inferior cerebellar artery. This was then advanced in a coaxial manner and with constant heparinized saline infusion using biplane roadmap technique to the distal end of the tip of the Phenom microcatheter. The microcatheter was then retrieved unsheathing the distal wire tip of the device including a few mm of the device itself. Unsheathing was continued until the device started opening in its distal aspect. This was then retrieved more proximally to the distal landing zone which was just proximal to the entry point of the contralateral left vertebral artery. Once there, the remainder of the device was then deployed by advancing the microcatheter using the fluffing technique with the microcatheter to promote apposition with the native vessel. The Phenom microcatheter was then advanced over the  micro guidewire into the distal basilar artery. Using a torque device, the micro guidewire was then captured into the microcatheter and removed. The microcatheter was connected to continuous heparinized saline infusion. A control arteriogram performed through the 5 [place] Catalyst guide catheter in the distal right vertebral artery demonstrated excellent apposition proximally and distally with moderate slow flow through the right posterior-inferior cerebellar artery and filling of the aneurysm. Control arteriograms were then performed at 15 and 30 minutes post deployment of the initial device. These continued to demonstrate excellent apposition without evidence of intraluminal filling defects or of occlusions. However, there continued to be a modest jet like innominate along the inferior aspect of the aneurysm. This prompted the use of a second flow diverter device. It was decided to use a 4.5 mm x 20 mm pipeline flex flow diverter device. This was again advanced to the distal end of the Phenom 027 microcatheter using biplane roadmap technique and constant fluoroscopic guidance in a coaxial manner and with constant heparinized saline infusion. This in turn was then visualized by retrieving the microcatheter. Once the distal 3 mm of the device was opened, the combination was then retrieved such that the landing zone of this was going to be in the proximal horizontal segment just distal to the right posterior-inferior cerebellar artery. Once in that position,, again using the pushing technique with the micro guidewire to deploy and the fluffing technique to stabilize the device the combination  was utilized to deploy the entire device. Once completely deployed, a control arteriogram performed through the Catalyst guide catheter in the distal right vertebral artery demonstrated excellent apposition proximally and distally. There was double coverage at the origin of the right posterior-inferior cerebellar artery. A  control arteriogram performed through the Catalyst guide catheter at 10 and 20 minutes post deployment of the second device continued to demonstrate excellent apposition with now slower flow in the region of the neck of the aneurysm. However, patency of the right posterior-inferior cerebellar artery continued to be maintained. A final control arteriogram performed through the 5 [place] Catalyst guide catheter in the right vertebral artery demonstrated excellent apposition without evidence of intraluminal filling defects or of occlusions, in the posterior cerebral circulation. Throughout the procedure, the patient's blood pressure and neurological status remained stable. The patient's ACT was in the region of 297. This prompted the use of approximately 15 mg of protamine sulfate to partially reverse the effect of the heparin. Thereafter, the 5 [place] Catalyst guide catheter, and the 8 [place] Neuron Max sheath were then retrieved into the abdominal aorta and exchanged over a J-tip guidewire for a 6 [place] Pinnacle sheath. This was then removed with hemostasis achieved manually in the right groin at the puncture site over 30 minutes. The patient's general anesthesia was then reversed and the patient was then extubated without difficulty. Upon recovery the patient demonstrated no new neurological signs or symptoms. She did not complain of any nausea. She did complain of mild headache. She was able to move all her fours equally proportional to the effort. Distal pulses remained palpable in the dorsalis pedis and posterior tibial regions bilaterally at the end of the procedure. She was then transferred to the neuro ICU to continue on low-dose IV heparin, close neurologic observations, and maintaining her adequate blood pressure. The patient did develop oozing from her right groin whilst being transferred from the gurney to her bed. Pressure was held for about 15-20 minutes with hemostasis. A pressure bandage was applied.  Overnight the patient complained of nausea with mild headache. This responded to 12.5 mg of Phenergan rectal suppository. She was able to take in free liquids in small amounts. The following morning, the IV heparin was stopped, the patient was switched to aspirin 81 mg a day, and Plavix 75 mg a day. Again while being sat up the patient had small oozing through the right groin puncture site. Again pressure was held for about 10 minutes. The patient was to be laid flat for 2 hours. Also encouraged was for the patient to start eating and drinking. The patient's potassium was also 3.1. This was replaced with oral potassium. The patient and her daughter were advised that upon her discharge, the patient was to rest overnight lying flat. She could ambulate using a walker or a cane the following morning. She was advised to maintain adequate hydration by drinking water. She was strongly advised to continue taking her aspirin 81 mg a day, and Plavix. She was to avoid stooping, bending or lifting weights above 10 pounds for a couple of weeks. She was also advised not to drive for a couple of weeks. The daughter was informed that should there be a slight oozing in the right groin puncture site to hold maintained pressure for approximately 10-15 minutes and keep the patient flat. Should she develop new neurological signs or symptoms or worsening headaches, visual problems, motor weakness, to call 911. The patient will be seen in follow-up in outpatient  clinic in 2 weeks post discharge. 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 PLAN: Follow-up in the clinic in 2 weeks time. Electronically Signed   By: Luanne Bras M.D.   On: 09/06/2018 11:50   [facility] Angiogram Follow Up Study  Result Date: 09/07/2018 CLINICAL DATA:  Worsening headaches. 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 Past history of right occipital craniotomy. EXAM: ARTERIOGRAPHY; WORKSTATION 3D RECONSTRUCTION; TRANSCATHETER THERAPY EMBOLIZATION; IR ANGIO VERTEBRAL SEL VERTEBRAL UNI RIGHT MOD SED; BILATERAL COMMON CAROTID AND INNOMINATE ANGIOGRAPHY COMPARISON:  CT angiogram of the head and neck of 08/30/2018. MEDICATIONS: Heparin 4,000 units IV; Ancef 2 g IV was administered within 1 hour of the procedure. ANESTHESIA/SEDATION: Mac anesthesia for the diagnostic portion of the examination, and general anesthesia for the embolization portion of the examination by the department of Anesthesiology at [facility]:  Isovue 300 approximately 110 mL. FLUOROSCOPY TIME:  Fluoroscopy Time: 61 minutes 6 seconds (3786 mGy). COMPLICATIONS: None immediate. TECHNIQUE: Informed written consent was obtained from the patient after a thorough discussion of the procedural risks, benefits and alternatives. All questions were addressed. Maximal Sterile Barrier Technique was utilized including caps, mask, sterile gowns, sterile gloves, sterile drape, hand hygiene and skin antiseptic. A timeout was performed prior to the initiation of the procedure. The right groin was prepped and draped in the usual sterile fashion. Thereafter using modified Seldinger technique, transfemoral access into the right common femoral artery was obtained without difficulty. Over a 0.035 inch guidewire, a 5 [place] Pinnacle sheath was inserted. Through this, and also over 0.035 inch guidewire, a 5 [place] JB 1 catheter was advanced to the aortic arch region and selectively positioned in the right common carotid artery, the right vertebral artery, and the left common carotid artery. Also performed was a 3D rotational arteriogram of the right posterior circulation via injection to the right vertebral artery. Reformations were then created on a separate workstation. Distal to this the right  vertebrobasilar junction demonstrates wide patency into the basilar artery. The basilar artery, the left posterior cerebral artery, the superior cerebellar arteries and the anterior-inferior cerebellar arteries opacify into the capillary and venous phases. FINDINGS: The right common carotid arteriogram demonstrates the right external carotid artery and its major branches to be widely patent. The right internal carotid artery at the bulb to the cranial skull base demonstrates wide patency. The petrous, cavernous and supraclinoid segments are widely patent. A right posterior communicating artery is seen opacifying the right posterior cerebral artery distribution. The right middle cerebral artery and the right anterior cerebral artery opacify into the capillary and venous phases. The dominant right vertebral artery origin is widely patent. The vessel is seen to opacify to the cranial skull base. There is mild caliber irregularity involving the distal right vertebral artery in the cervical segment. Distal to this the origin of the right posterior-inferior cerebellar artery is also patent. There is mild fusiform dilatation of the right vertebrobasilar junction adjacent to the origin of the right posterior-inferior cerebellar artery with a nubbin along the lateral superior wall. A similar less prominent indentation is noted more proximally. Arising from the proximal 1/3 of the right posterior-inferior cerebellar artery is a wide necked irregular approximately 6 mm x 5 mm aneurysm. Flash filling of the right posterior cerebral artery P1 is noted. Unopacified blood is also  noted in the basilar artery from the contralateral vertebral artery. The left common carotid arteriogram demonstrates the left external carotid artery and its major branches to be widely patent. The left internal carotid artery at the bulb to the cranial skull base demonstrates wide patency. The petrous, cavernous and supraclinoid segments are widely  patent. A small infundibulum is seen in the left posterior communicating artery region. The left middle cerebral artery and the left anterior cerebral artery opacify into the capillary and venous phases. 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 The angio findings were reviewed with patient and the patient's daughter. Brought to their attention was the irregular right posterior-inferior cerebellar artery aneurysm, and the beaking effect at the distal aspect of the right vertebrobasilar junction probably representing the superior margin of the partially thrombosed large to giant aneurysm. Options mentioned to them were those of stent assisted coiling versus placement of a flow diversion within the left posterior inferior cerebellar artery, and/or across the origin of the left posterior-inferior cerebellar artery. The patient and daughter were agreeable to proceed with endovascular treatment. The patient was then put under general anesthesia by the Department of Anesthesiology. The diagnostic JB 1 catheter in the right subclavian artery distally was exchanged over a 0.035 inch 300 cm Rosen exchange guidewire for an 85 cm 8 [place] Neuron Max guide sheath. This was advanced with its distal tip distal to the origin of the right vertebral artery. This was then connected to continuous heparinized saline infusion. Over the [employer] guidewire, a 5 French 115 cm Catalyst guide catheter was then advanced and positioned distal to the tip of the Neuron Max sheath. The guidewire was removed. Good aspiration obtained from the hub of the 5 [place] Catalyst guide catheter. The combination was then retrieved more proximally and proximal to the origin of the right vertebral artery. Using biplane roadmap technique and constant fluoroscopic guidance, a 0.035 inch Roadrunner guidewire was  then advanced through the Catalyst guide catheter. Access into the right vertebral artery was obtained. The wire was advanced to the distal right vertebral artery followed by the Catalyst guide catheter. Also at this time, the 8 [place] Neuron Max guide sheath was advanced to the proximal 1/3 of the right vertebral artery. The guidewire was removed. Good aspiration obtained from the hub of the Catalyst guide catheter. A gentle control arteriogram performed through the Catalyst guide catheter demonstrated no evidence of spasms, dissections or of intraluminal filling defects. Arteriogram was then performed in the biplane DSA mode of the basilar artery. Measurements were performed of the right vertebral artery proximal to the right PICA, and distal to the right posterior-inferior cerebellar artery and the distal right vertebrobasilar junction. Again noted was a nubbin arising from the lateral superior wall of the right vertebrobasilar junction distal to the origin of the right posterior-inferior cerebellar artery. 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 An 027 Phenom 150 cm microcatheter was then advanced over a 0.014 inch Softip Synchro micro guidewire to the distal end of the Catalyst guide catheter. With the micro guidewire leading with a J-tip configuration, access was obtained into the origin of the right posterior-inferior cerebellar artery. The Phenom microcatheter was then advanced to close proximity to the origin of the right posterior-inferior cerebellar artery. Using a torque device, the wire was then manipulated to  advance passed the wide neck aneurysm of the right posterior-inferior cerebellar artery. Access distal to the neck of the aneurysm could not be achieved with the micro guidewire. Therefore, this was then abandoned. The Phenom microcatheter was then advanced to the distal basilar artery just proximal to the  origin of the posterior cerebral arteries over a 0.014 inch Softip Synchro micro guidewire. The guidewire was removed. Good aspiration obtained from the hub of the Phenom 027 microcatheter. A gentle contrast injection demonstrated safe position of the tip of the microcatheter. This was then connected to continuous heparinized saline infusion. Following evaluation of the 3D images, and the diagnostic catheter arteriograms, it was decided to use a 4.5 mm x 35 mm pipeline flex flow diverter device across the neck of the right posterior-inferior cerebellar artery. This was then advanced in a coaxial manner and with constant heparinized saline infusion using biplane roadmap technique to the distal end of the tip of the Phenom microcatheter. The microcatheter was then retrieved unsheathing the distal wire tip of the device including a few mm of the device itself. Unsheathing was continued until the device started opening in its distal aspect. This was then retrieved more proximally to the distal landing zone which was just proximal to the entry point of the contralateral left vertebral artery. Once there, the remainder of the device was then deployed by advancing the microcatheter using the fluffing technique with the microcatheter to promote apposition with the native vessel. The Phenom microcatheter was then advanced over the micro guidewire into the distal basilar artery. Using a torque device, the micro guidewire was then captured into the microcatheter and removed. The microcatheter was connected to continuous heparinized saline infusion. A control arteriogram performed through the 5 [place] Catalyst guide catheter in the distal right vertebral artery demonstrated excellent apposition proximally and distally with moderate slow flow through the right posterior-inferior cerebellar artery and filling of the aneurysm. Control arteriograms were then performed at 15 and 30 minutes post deployment of the initial device.  These continued to demonstrate excellent apposition without evidence of intraluminal filling defects or of occlusions. However, there continued to be a modest jet like innominate along the inferior aspect of the aneurysm. This prompted the use of a second flow diverter device. It was decided to use a 4.5 mm x 20 mm pipeline flex flow diverter device. This was again advanced to the distal end of the Phenom 027 microcatheter using biplane roadmap technique and constant fluoroscopic guidance in a coaxial manner and with constant heparinized saline infusion. This in turn was then visualized by retrieving the microcatheter. Once the distal 3 mm of the device was opened, the combination was then retrieved such that the landing zone of this was going to be in the proximal horizontal segment just distal to the right posterior-inferior cerebellar artery. Once in that position,, again using the pushing technique with the micro guidewire to deploy and the fluffing technique to stabilize the device the combination was utilized to deploy the entire device. Once completely deployed, a control arteriogram performed through the Catalyst guide catheter in the distal right vertebral artery demonstrated excellent apposition proximally and distally. There was double coverage at the origin of the right posterior-inferior cerebellar artery. A control arteriogram performed through the Catalyst guide catheter at 10 and 20 minutes post deployment of the second device continued to demonstrate excellent apposition with now slower flow in the region of the neck of the aneurysm. However, patency of the right posterior-inferior cerebellar artery continued to  be maintained. A final control arteriogram performed through the 5 [place] Catalyst guide catheter in the right vertebral artery demonstrated excellent apposition without evidence of intraluminal filling defects or of occlusions, in the posterior cerebral circulation. Throughout the  procedure, the patient's blood pressure and neurological status remained stable. The patient's ACT was in the region of 297. This prompted the use of approximately 15 mg of protamine sulfate to partially reverse the effect of the heparin. Thereafter, the 5 [place] Catalyst guide catheter, and the 8 [place] Neuron Max sheath were then retrieved into the abdominal aorta and exchanged over a J-tip guidewire for a 6 [place] Pinnacle sheath. This was then removed with hemostasis achieved manually in the right groin at the puncture site over 30 minutes. The patient's general anesthesia was then reversed and the patient was then extubated without difficulty. Upon recovery the patient demonstrated no new neurological signs or symptoms. She did not complain of any nausea. She did complain of mild headache. She was able to move all her fours equally proportional to the effort. Distal pulses remained palpable in the dorsalis pedis and posterior tibial regions bilaterally at the end of the procedure. She was then transferred to the neuro ICU to continue on low-dose IV heparin, close neurologic observations, and maintaining her adequate blood pressure. The patient did develop oozing from her right groin whilst being transferred from the gurney to her bed. Pressure was held for about 15-20 minutes with hemostasis. A pressure bandage was applied. Overnight the patient complained of nausea with mild headache. This responded to 12.5 mg of Phenergan rectal suppository. She was able to take in free liquids in small amounts. The following morning, the IV heparin was stopped, the patient was switched to aspirin 81 mg a day, and Plavix 75 mg a day. Again while being sat up the patient had small oozing through the right groin puncture site. Again pressure was held for about 10 minutes. The patient was to be laid flat for 2 hours. Also encouraged was for the patient to start eating and drinking. The patient's potassium was also 3.1. This was  replaced with oral potassium. The patient and her daughter were advised that upon her discharge, the patient was to rest overnight lying flat. She could ambulate using a walker or a cane the following morning. She was advised to maintain adequate hydration by drinking water. She was strongly advised to continue taking her aspirin 81 mg a day, and Plavix. She was to avoid stooping, bending or lifting weights above 10 pounds for a couple of weeks. She was also advised not to drive for a couple of weeks. The daughter was informed that should there be a slight oozing in the right groin puncture site to hold maintained pressure for approximately 10-15 minutes and keep the patient flat. Should she develop new neurological signs or symptoms or worsening headaches, visual problems, motor weakness, to call 911. The patient will be seen in follow-up in outpatient clinic in 2 weeks post discharge. 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 PLAN: Follow-up in the clinic in 2 weeks time. Electronically Signed   By: Luanne Bras M.D.   On: 09/06/2018 11:50   Ir 3d [facility]  Result Date: 09/07/2018 CLINICAL DATA:  Worsening headaches. 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 Past history of right  occipital craniotomy. EXAM: ARTERIOGRAPHY; WORKSTATION 3D RECONSTRUCTION; TRANSCATHETER THERAPY EMBOLIZATION; IR ANGIO VERTEBRAL SEL VERTEBRAL UNI RIGHT MOD SED; BILATERAL COMMON CAROTID AND INNOMINATE ANGIOGRAPHY COMPARISON:  CT angiogram of the head and neck of 08/30/2018. MEDICATIONS: Heparin 4,000 units IV; Ancef 2 g IV was administered within 1 hour of the procedure. ANESTHESIA/SEDATION: Mac anesthesia for the diagnostic portion of the examination, and general anesthesia for the embolization  portion of the examination by the department of Anesthesiology at [facility]:  Isovue 300 approximately 110 mL. FLUOROSCOPY TIME:  Fluoroscopy Time: 61 minutes 6 seconds (3786 mGy). COMPLICATIONS: None immediate. TECHNIQUE: Informed written consent was obtained from the patient after a thorough discussion of the procedural risks, benefits and alternatives. All questions were addressed. Maximal Sterile Barrier Technique was utilized including caps, mask, sterile gowns, sterile gloves, sterile drape, hand hygiene and skin antiseptic. A timeout was performed prior to the initiation of the procedure. The right groin was prepped and draped in the usual sterile fashion. Thereafter using modified Seldinger technique, transfemoral access into the right common femoral artery was obtained without difficulty. Over a 0.035 inch guidewire, a 5 [place] Pinnacle sheath was inserted. Through this, and also over 0.035 inch guidewire, a 5 [place] JB 1 catheter was advanced to the aortic arch region and selectively positioned in the right common carotid artery, the right vertebral artery, and the left common carotid artery. Also performed was a 3D rotational arteriogram of the right posterior circulation via injection to the right vertebral artery. Reformations were then created on a separate workstation. Distal to this the right vertebrobasilar junction demonstrates wide patency into the basilar artery. The basilar artery, the left posterior cerebral artery, the superior cerebellar arteries and the anterior-inferior cerebellar arteries opacify into the capillary and venous phases. FINDINGS: The right common carotid arteriogram demonstrates the right external carotid artery and its major branches to be widely patent. The right internal carotid artery at the bulb to the cranial skull base demonstrates wide patency. The petrous, cavernous and supraclinoid segments are widely patent. A right posterior communicating  artery is seen opacifying the right posterior cerebral artery distribution. The right middle cerebral artery and the right anterior cerebral artery opacify into the capillary and venous phases. The dominant right vertebral artery origin is widely patent. The vessel is seen to opacify to the cranial skull base. There is mild caliber irregularity involving the distal right vertebral artery in the cervical segment. Distal to this the origin of the right posterior-inferior cerebellar artery is also patent. There is mild fusiform dilatation of the right vertebrobasilar junction adjacent to the origin of the right posterior-inferior cerebellar artery with a nubbin along the lateral superior wall. A similar less prominent indentation is noted more proximally. Arising from the proximal 1/3 of the right posterior-inferior cerebellar artery is a wide necked irregular approximately 6 mm x 5 mm aneurysm. Flash filling of the right posterior cerebral artery P1 is noted. Unopacified blood is also noted in the basilar artery from the contralateral vertebral artery. The left common carotid arteriogram demonstrates the left external carotid artery and its major branches to be widely patent. The left internal carotid artery at the bulb to the cranial skull base demonstrates wide patency. The petrous, cavernous and supraclinoid segments are widely patent. A small infundibulum is seen in the left posterior communicating artery region. The left middle cerebral artery and the left anterior cerebral artery opacify into the capillary and venous phases. ENDOVASCULAR TREATMENT OF IRREGULAR 6 MM X 5 MM  RIGHT POSTERIOR INFERIOR CEREBELLAR ARTERY ANEURYSM WITH FLOW DIVERSION USING 2 PIPELINE FLOW DIVERTER DEVICES IN A TELESCOPING FASHION. The angio findings were reviewed with patient and the patient's daughter. Brought to their attention was the irregular right posterior-inferior cerebellar artery aneurysm, and the beaking effect at the distal  aspect of the right vertebrobasilar junction probably representing the superior margin of the partially thrombosed large to giant aneurysm. Options mentioned to them were those of stent assisted coiling versus placement of a flow diversion within the left posterior inferior cerebellar artery, and/or across the origin of the left posterior-inferior cerebellar artery. The patient and daughter were agreeable to proceed with endovascular treatment. The patient was then put under general anesthesia by the Department of Anesthesiology. The diagnostic JB 1 catheter in the right subclavian artery distally was exchanged over a 0.035 inch 300 cm Rosen exchange guidewire for an 85 cm 8 [place] Neuron Max guide sheath. This was advanced with its distal tip distal to the origin of the right vertebral artery. This was then connected to continuous heparinized saline infusion. Over the [employer] guidewire, a 5 French 115 cm Catalyst guide catheter was then advanced and positioned distal to the tip of the Neuron Max sheath. The guidewire was removed. Good aspiration obtained from the hub of the 5 [place] Catalyst guide catheter. The combination was then retrieved more proximally and proximal to the origin of the right vertebral artery. Using biplane roadmap technique and constant fluoroscopic guidance, a 0.035 inch Roadrunner guidewire was then advanced through the Catalyst guide catheter. Access into the right vertebral artery was obtained. The wire was advanced to the distal right vertebral artery followed by the Catalyst guide catheter. Also at this time, the 8 [place] Neuron Max guide sheath was advanced to the proximal 1/3 of the right vertebral artery. The guidewire was removed. Good aspiration obtained from the hub of the Catalyst guide catheter. A gentle control arteriogram performed through the Catalyst guide catheter demonstrated no evidence of spasms, dissections or of intraluminal filling defects. Arteriogram was then  performed in the biplane DSA mode of the basilar artery. Measurements were performed of the right vertebral artery proximal to the right PICA, and distal to the right posterior-inferior cerebellar artery and the distal right vertebrobasilar junction. Again noted was a nubbin arising from the lateral superior wall of the right vertebrobasilar junction distal to the origin of the right posterior-inferior cerebellar artery. 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 An 027 Phenom 150 cm microcatheter was then advanced over a 0.014 inch Softip Synchro micro guidewire to the distal end of the Catalyst guide catheter. With the micro guidewire leading with a J-tip configuration, access was obtained into the origin of the right posterior-inferior cerebellar artery. The Phenom microcatheter was then advanced to close proximity to the origin of the right posterior-inferior cerebellar artery. Using a torque device, the wire was then manipulated to advance passed the wide neck aneurysm of the right posterior-inferior cerebellar artery. Access distal to the neck of the aneurysm could not be achieved with the micro guidewire. Therefore, this was then abandoned. The Phenom microcatheter was then advanced to the distal basilar artery just proximal to the origin of the posterior cerebral arteries over a 0.014 inch Softip Synchro micro guidewire. The guidewire was removed. Good aspiration obtained from the hub of the Phenom 027 microcatheter. A gentle contrast injection demonstrated safe position of the tip of the microcatheter. This was then  connected to continuous heparinized saline infusion. Following evaluation of the 3D images, and the diagnostic catheter arteriograms, it was decided to use a 4.5 mm x 35 mm pipeline flex flow diverter device across the neck of the right posterior-inferior cerebellar artery. This was then advanced in a coaxial  manner and with constant heparinized saline infusion using biplane roadmap technique to the distal end of the tip of the Phenom microcatheter. The microcatheter was then retrieved unsheathing the distal wire tip of the device including a few mm of the device itself. Unsheathing was continued until the device started opening in its distal aspect. This was then retrieved more proximally to the distal landing zone which was just proximal to the entry point of the contralateral left vertebral artery. Once there, the remainder of the device was then deployed by advancing the microcatheter using the fluffing technique with the microcatheter to promote apposition with the native vessel. The Phenom microcatheter was then advanced over the micro guidewire into the distal basilar artery. Using a torque device, the micro guidewire was then captured into the microcatheter and removed. The microcatheter was connected to continuous heparinized saline infusion. A control arteriogram performed through the 5 [place] Catalyst guide catheter in the distal right vertebral artery demonstrated excellent apposition proximally and distally with moderate slow flow through the right posterior-inferior cerebellar artery and filling of the aneurysm. Control arteriograms were then performed at 15 and 30 minutes post deployment of the initial device. These continued to demonstrate excellent apposition without evidence of intraluminal filling defects or of occlusions. However, there continued to be a modest jet like innominate along the inferior aspect of the aneurysm. This prompted the use of a second flow diverter device. It was decided to use a 4.5 mm x 20 mm pipeline flex flow diverter device. This was again advanced to the distal end of the Phenom 027 microcatheter using biplane roadmap technique and constant fluoroscopic guidance in a coaxial manner and with constant heparinized saline infusion. This in turn was then visualized by retrieving  the microcatheter. Once the distal 3 mm of the device was opened, the combination was then retrieved such that the landing zone of this was going to be in the proximal horizontal segment just distal to the right posterior-inferior cerebellar artery. Once in that position,, again using the pushing technique with the micro guidewire to deploy and the fluffing technique to stabilize the device the combination was utilized to deploy the entire device. Once completely deployed, a control arteriogram performed through the Catalyst guide catheter in the distal right vertebral artery demonstrated excellent apposition proximally and distally. There was double coverage at the origin of the right posterior-inferior cerebellar artery. A control arteriogram performed through the Catalyst guide catheter at 10 and 20 minutes post deployment of the second device continued to demonstrate excellent apposition with now slower flow in the region of the neck of the aneurysm. However, patency of the right posterior-inferior cerebellar artery continued to be maintained. A final control arteriogram performed through the 5 [place] Catalyst guide catheter in the right vertebral artery demonstrated excellent apposition without evidence of intraluminal filling defects or of occlusions, in the posterior cerebral circulation. Throughout the procedure, the patient's blood pressure and neurological status remained stable. The patient's ACT was in the region of 297. This prompted the use of approximately 15 mg of protamine sulfate to partially reverse the effect of the heparin. Thereafter, the 5 [place] Catalyst guide catheter, and the 8 [place] Neuron Max sheath were then retrieved  into the abdominal aorta and exchanged over a J-tip guidewire for a 6 French Pinnacle sheath. This was then removed with hemostasis achieved manually in the right groin at the puncture site over 30 minutes. The patient's general anesthesia was then reversed and the patient  was then extubated without difficulty. Upon recovery the patient demonstrated no new neurological signs or symptoms. She did not complain of any nausea. She did complain of mild headache. She was able to move all her fours equally proportional to the effort. Distal pulses remained palpable in the dorsalis pedis and posterior tibial regions bilaterally at the end of the procedure. She was then transferred to the neuro ICU to continue on low-dose IV heparin, close neurologic observations, and maintaining her adequate blood pressure. The patient did develop oozing from her right groin whilst being transferred from the gurney to her bed. Pressure was held for about 15-20 minutes with hemostasis. A pressure bandage was applied. Overnight the patient complained of nausea with mild headache. This responded to 12.5 mg of Phenergan rectal suppository. She was able to take in free liquids in small amounts. The following morning, the IV heparin was stopped, the patient was switched to aspirin 81 mg a day, and Plavix 75 mg a day. Again while being sat up the patient had small oozing through the right groin puncture site. Again pressure was held for about 10 minutes. The patient was to be laid flat for 2 hours. Also encouraged was for the patient to start eating and drinking. The patient's potassium was also 3.1. This was replaced with oral potassium. The patient and her daughter were advised that upon her discharge, the patient was to rest overnight lying flat. She could ambulate using a walker or a cane the following morning. She was advised to maintain adequate hydration by drinking water. She was strongly advised to continue taking her aspirin 81 mg a day, and Plavix. She was to avoid stooping, bending or lifting weights above 10 pounds for a couple of weeks. She was also advised not to drive for a couple of weeks. The daughter was informed that should there be a slight oozing in the right groin puncture site to hold  maintained pressure for approximately 10-15 minutes and keep the patient flat. Should she develop new neurological signs or symptoms or worsening headaches, visual problems, motor weakness, to call 911. The patient will be seen in follow-up in outpatient clinic in 2 weeks post discharge. 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 PLAN: Follow-up in the clinic in 2 weeks time. Electronically Signed   By: Luanne Bras M.D.   On: 09/06/2018 11:50   Ir Angio Intra Extracran Sel Com Carotid Innominate Bilat Mod Sed  Result Date: 09/07/2018 CLINICAL DATA:  Worsening headaches. 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 Past history of right occipital craniotomy. EXAM: ARTERIOGRAPHY; WORKSTATION 3D RECONSTRUCTION; TRANSCATHETER THERAPY EMBOLIZATION; IR ANGIO VERTEBRAL SEL VERTEBRAL UNI RIGHT MOD SED; BILATERAL COMMON CAROTID AND INNOMINATE ANGIOGRAPHY COMPARISON:  CT angiogram of the head and neck of 08/30/2018. MEDICATIONS: Heparin 4,000 units IV; Ancef 2 g IV was administered within 1 hour of the procedure. ANESTHESIA/SEDATION: Mac anesthesia for the diagnostic portion of the examination, and general anesthesia for the embolization portion of the examination by the department of Anesthesiology at [facility]:  Isovue 300  approximately 110 mL. FLUOROSCOPY TIME:  Fluoroscopy Time: 61 minutes 6 seconds (3786 mGy). COMPLICATIONS: None immediate. TECHNIQUE: Informed written consent was obtained from the patient after a thorough discussion of the procedural risks, benefits and alternatives. All questions were addressed. Maximal Sterile Barrier Technique was utilized including caps, mask, sterile gowns, sterile gloves, sterile drape, hand hygiene and skin antiseptic. A timeout was  performed prior to the initiation of the procedure. The right groin was prepped and draped in the usual sterile fashion. Thereafter using modified Seldinger technique, transfemoral access into the right common femoral artery was obtained without difficulty. Over a 0.035 inch guidewire, a 5 French Pinnacle sheath was inserted. Through this, and also over 0.035 inch guidewire, a 5 [place] JB 1 catheter was advanced to the aortic arch region and selectively positioned in the right common carotid artery, the right vertebral artery, and the left common carotid artery. Also performed was a 3D rotational arteriogram of the right posterior circulation via injection to the right vertebral artery. Reformations were then created on a separate workstation. Distal to this the right vertebrobasilar junction demonstrates wide patency into the basilar artery. The basilar artery, the left posterior cerebral artery, the superior cerebellar arteries and the anterior-inferior cerebellar arteries opacify into the capillary and venous phases. FINDINGS: The right common carotid arteriogram demonstrates the right external carotid artery and its major branches to be widely patent. The right internal carotid artery at the bulb to the cranial skull base demonstrates wide patency. The petrous, cavernous and supraclinoid segments are widely patent. A right posterior communicating artery is seen opacifying the right posterior cerebral artery distribution. The right middle cerebral artery and the right anterior cerebral artery opacify into the capillary and venous phases. The dominant right vertebral artery origin is widely patent. The vessel is seen to opacify to the cranial skull base. There is mild caliber irregularity involving the distal right vertebral artery in the cervical segment. Distal to this the origin of the right posterior-inferior cerebellar artery is also patent. There is mild fusiform dilatation of the right vertebrobasilar  junction adjacent to the origin of the right posterior-inferior cerebellar artery with a nubbin along the lateral superior wall. A similar less prominent indentation is noted more proximally. Arising from the proximal 1/3 of the right posterior-inferior cerebellar artery is a wide necked irregular approximately 6 mm x 5 mm aneurysm. Flash filling of the right posterior cerebral artery P1 is noted. Unopacified blood is also noted in the basilar artery from the contralateral vertebral artery. The left common carotid arteriogram demonstrates the left external carotid artery and its major branches to be widely patent. The left internal carotid artery at the bulb to the cranial skull base demonstrates wide patency. The petrous, cavernous and supraclinoid segments are widely patent. A small infundibulum is seen in the left posterior communicating artery region. The left middle cerebral artery and the left anterior cerebral artery opacify into the capillary and venous phases. 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 The angio findings were reviewed with patient and the patient's daughter. Brought to their attention was the irregular right posterior-inferior cerebellar artery aneurysm, and the beaking effect at the distal aspect of the right vertebrobasilar junction probably representing the superior margin of the partially thrombosed large to giant aneurysm. Options mentioned to them were those of stent assisted coiling versus placement of a flow diversion within the  left posterior inferior cerebellar artery, and/or across the origin of the left posterior-inferior cerebellar artery. The patient and daughter were agreeable to proceed with endovascular treatment. The patient was then put under general anesthesia by the Department of Anesthesiology. The diagnostic JB 1 catheter in the  right subclavian artery distally was exchanged over a 0.035 inch 300 cm Rosen exchange guidewire for an 85 cm 8 [place] Neuron Max guide sheath. This was advanced with its distal tip distal to the origin of the right vertebral artery. This was then connected to continuous heparinized saline infusion. Over the [employer] guidewire, a 5 French 115 cm Catalyst guide catheter was then advanced and positioned distal to the tip of the Neuron Max sheath. The guidewire was removed. Good aspiration obtained from the hub of the 5 [place] Catalyst guide catheter. The combination was then retrieved more proximally and proximal to the origin of the right vertebral artery. Using biplane roadmap technique and constant fluoroscopic guidance, a 0.035 inch Roadrunner guidewire was then advanced through the Catalyst guide catheter. Access into the right vertebral artery was obtained. The wire was advanced to the distal right vertebral artery followed by the Catalyst guide catheter. Also at this time, the 8 [place] Neuron Max guide sheath was advanced to the proximal 1/3 of the right vertebral artery. The guidewire was removed. Good aspiration obtained from the hub of the Catalyst guide catheter. A gentle control arteriogram performed through the Catalyst guide catheter demonstrated no evidence of spasms, dissections or of intraluminal filling defects. Arteriogram was then performed in the biplane DSA mode of the basilar artery. Measurements were performed of the right vertebral artery proximal to the right PICA, and distal to the right posterior-inferior cerebellar artery and the distal right vertebrobasilar junction. Again noted was a nubbin arising from the lateral superior wall of the right vertebrobasilar junction distal to the origin of the right posterior-inferior cerebellar artery. 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 An 027 Phenom 150  cm microcatheter was then advanced over a 0.014 inch Softip Synchro micro guidewire to the distal end of the Catalyst guide catheter. With the micro guidewire leading with a J-tip configuration, access was obtained into the origin of the right posterior-inferior cerebellar artery. The Phenom microcatheter was then advanced to close proximity to the origin of the right posterior-inferior cerebellar artery. Using a torque device, the wire was then manipulated to advance passed the wide neck aneurysm of the right posterior-inferior cerebellar artery. Access distal to the neck of the aneurysm could not be achieved with the micro guidewire. Therefore, this was then abandoned. The Phenom microcatheter was then advanced to the distal basilar artery just proximal to the origin of the posterior cerebral arteries over a 0.014 inch Softip Synchro micro guidewire. The guidewire was removed. Good aspiration obtained from the hub of the Phenom 027 microcatheter. A gentle contrast injection demonstrated safe position of the tip of the microcatheter. This was then connected to continuous heparinized saline infusion. Following evaluation of the 3D images, and the diagnostic catheter arteriograms, it was decided to use a 4.5 mm x 35 mm pipeline flex flow diverter device across the neck of the right posterior-inferior cerebellar artery. This was then advanced in a coaxial manner and with constant heparinized saline infusion using biplane roadmap technique to the distal end of the tip of the Phenom microcatheter. The microcatheter was then retrieved unsheathing the distal wire tip of the device including a  few mm of the device itself. Unsheathing was continued until the device started opening in its distal aspect. This was then retrieved more proximally to the distal landing zone which was just proximal to the entry point of the contralateral left vertebral artery. Once there, the remainder of the device was then deployed by advancing  the microcatheter using the fluffing technique with the microcatheter to promote apposition with the native vessel. The Phenom microcatheter was then advanced over the micro guidewire into the distal basilar artery. Using a torque device, the micro guidewire was then captured into the microcatheter and removed. The microcatheter was connected to continuous heparinized saline infusion. A control arteriogram performed through the 5 [place] Catalyst guide catheter in the distal right vertebral artery demonstrated excellent apposition proximally and distally with moderate slow flow through the right posterior-inferior cerebellar artery and filling of the aneurysm. Control arteriograms were then performed at 15 and 30 minutes post deployment of the initial device. These continued to demonstrate excellent apposition without evidence of intraluminal filling defects or of occlusions. However, there continued to be a modest jet like innominate along the inferior aspect of the aneurysm. This prompted the use of a second flow diverter device. It was decided to use a 4.5 mm x 20 mm pipeline flex flow diverter device. This was again advanced to the distal end of the Phenom 027 microcatheter using biplane roadmap technique and constant fluoroscopic guidance in a coaxial manner and with constant heparinized saline infusion. This in turn was then visualized by retrieving the microcatheter. Once the distal 3 mm of the device was opened, the combination was then retrieved such that the landing zone of this was going to be in the proximal horizontal segment just distal to the right posterior-inferior cerebellar artery. Once in that position,, again using the pushing technique with the micro guidewire to deploy and the fluffing technique to stabilize the device the combination was utilized to deploy the entire device. Once completely deployed, a control arteriogram performed through the Catalyst guide catheter in the distal right  vertebral artery demonstrated excellent apposition proximally and distally. There was double coverage at the origin of the right posterior-inferior cerebellar artery. A control arteriogram performed through the Catalyst guide catheter at 10 and 20 minutes post deployment of the second device continued to demonstrate excellent apposition with now slower flow in the region of the neck of the aneurysm. However, patency of the right posterior-inferior cerebellar artery continued to be maintained. A final control arteriogram performed through the 5 [place] Catalyst guide catheter in the right vertebral artery demonstrated excellent apposition without evidence of intraluminal filling defects or of occlusions, in the posterior cerebral circulation. Throughout the procedure, the patient's blood pressure and neurological status remained stable. The patient's ACT was in the region of 297. This prompted the use of approximately 15 mg of protamine sulfate to partially reverse the effect of the heparin. Thereafter, the 5 [place] Catalyst guide catheter, and the 8 [place] Neuron Max sheath were then retrieved into the abdominal aorta and exchanged over a J-tip guidewire for a 6 [place] Pinnacle sheath. This was then removed with hemostasis achieved manually in the right groin at the puncture site over 30 minutes. The patient's general anesthesia was then reversed and the patient was then extubated without difficulty. Upon recovery the patient demonstrated no new neurological signs or symptoms. She did not complain of any nausea. She did complain of mild headache. She was able to move all her fours equally proportional to the  effort. Distal pulses remained palpable in the dorsalis pedis and posterior tibial regions bilaterally at the end of the procedure. She was then transferred to the neuro ICU to continue on low-dose IV heparin, close neurologic observations, and maintaining her adequate blood pressure. The patient did develop  oozing from her right groin whilst being transferred from the gurney to her bed. Pressure was held for about 15-20 minutes with hemostasis. A pressure bandage was applied. Overnight the patient complained of nausea with mild headache. This responded to 12.5 mg of Phenergan rectal suppository. She was able to take in free liquids in small amounts. The following morning, the IV heparin was stopped, the patient was switched to aspirin 81 mg a day, and Plavix 75 mg a day. Again while being sat up the patient had small oozing through the right groin puncture site. Again pressure was held for about 10 minutes. The patient was to be laid flat for 2 hours. Also encouraged was for the patient to start eating and drinking. The patient's potassium was also 3.1. This was replaced with oral potassium. The patient and her daughter were advised that upon her discharge, the patient was to rest overnight lying flat. She could ambulate using a walker or a cane the following morning. She was advised to maintain adequate hydration by drinking water. She was strongly advised to continue taking her aspirin 81 mg a day, and Plavix. She was to avoid stooping, bending or lifting weights above 10 pounds for a couple of weeks. She was also advised not to drive for a couple of weeks. The daughter was informed that should there be a slight oozing in the right groin puncture site to hold maintained pressure for approximately 10-15 minutes and keep the patient flat. Should she develop new neurological signs or symptoms or worsening headaches, visual problems, motor weakness, to call 911. The patient will be seen in follow-up in outpatient clinic in 2 weeks post discharge. 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 PLAN: Follow-up in the clinic in 2 weeks time. Electronically  Signed   By: Luanne Bras M.D.   On: 09/06/2018 11:50   Ir Angio Vertebral Sel Vertebral Uni R Mod Sed  Result Date: 09/07/2018 CLINICAL DATA:  Worsening headaches. 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 Past history of right occipital craniotomy. EXAM: ARTERIOGRAPHY; WORKSTATION 3D RECONSTRUCTION; TRANSCATHETER THERAPY EMBOLIZATION; IR ANGIO VERTEBRAL SEL VERTEBRAL UNI RIGHT MOD SED; BILATERAL COMMON CAROTID AND INNOMINATE ANGIOGRAPHY COMPARISON:  CT angiogram of the head and neck of 08/30/2018. MEDICATIONS: Heparin 4,000 units IV; Ancef 2 g IV was administered within 1 hour of the procedure. ANESTHESIA/SEDATION: Mac anesthesia for the diagnostic portion of the examination, and general anesthesia for the embolization portion of the examination by the department of Anesthesiology at [place]:  Isovue 300 approximately 110 mL. FLUOROSCOPY TIME:  Fluoroscopy Time: 61 minutes 6 seconds (3786 mGy). COMPLICATIONS: None immediate. TECHNIQUE: Informed written consent was obtained from the patient after a thorough discussion of the procedural risks, benefits and alternatives. All questions were addressed. Maximal Sterile Barrier Technique was utilized including caps, mask, sterile gowns, sterile gloves, sterile drape, hand hygiene and skin antiseptic. A timeout was performed prior to the initiation of the procedure. The right groin was prepped and draped in the usual sterile fashion. Thereafter using modified Seldinger  technique, transfemoral access into the right common femoral artery was obtained without difficulty. Over a 0.035 inch guidewire, a 5 [place] Pinnacle sheath was inserted. Through this, and also over 0.035 inch guidewire, a 5 [place] JB 1 catheter was advanced to the aortic arch region and selectively positioned in the right common carotid artery, the right vertebral artery, and the left common carotid artery. Also performed was  a 3D rotational arteriogram of the right posterior circulation via injection to the right vertebral artery. Reformations were then created on a separate workstation. Distal to this the right vertebrobasilar junction demonstrates wide patency into the basilar artery. The basilar artery, the left posterior cerebral artery, the superior cerebellar arteries and the anterior-inferior cerebellar arteries opacify into the capillary and venous phases. FINDINGS: The right common carotid arteriogram demonstrates the right external carotid artery and its major branches to be widely patent. The right internal carotid artery at the bulb to the cranial skull base demonstrates wide patency. The petrous, cavernous and supraclinoid segments are widely patent. A right posterior communicating artery is seen opacifying the right posterior cerebral artery distribution. The right middle cerebral artery and the right anterior cerebral artery opacify into the capillary and venous phases. The dominant right vertebral artery origin is widely patent. The vessel is seen to opacify to the cranial skull base. There is mild caliber irregularity involving the distal right vertebral artery in the cervical segment. Distal to this the origin of the right posterior-inferior cerebellar artery is also patent. There is mild fusiform dilatation of the right vertebrobasilar junction adjacent to the origin of the right posterior-inferior cerebellar artery with a nubbin along the lateral superior wall. A similar less prominent indentation is noted more proximally. Arising from the proximal 1/3 of the right posterior-inferior cerebellar artery is a wide necked irregular approximately 6 mm x 5 mm aneurysm. Flash filling of the right posterior cerebral artery P1 is noted. Unopacified blood is also noted in the basilar artery from the contralateral vertebral artery. The left common carotid arteriogram demonstrates the left external carotid artery and its major  branches to be widely patent. The left internal carotid artery at the bulb to the cranial skull base demonstrates wide patency. The petrous, cavernous and supraclinoid segments are widely patent. A small infundibulum is seen in the left posterior communicating artery region. The left middle cerebral artery and the left anterior cerebral artery opacify into the capillary and venous phases. 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 The angio findings were reviewed with patient and the patient's daughter. Brought to their attention was the irregular right posterior-inferior cerebellar artery aneurysm, and the beaking effect at the distal aspect of the right vertebrobasilar junction probably representing the superior margin of the partially thrombosed large to giant aneurysm. Options mentioned to them were those of stent assisted coiling versus placement of a flow diversion within the left posterior inferior cerebellar artery, and/or across the origin of the left posterior-inferior cerebellar artery. The patient and daughter were agreeable to proceed with endovascular treatment. The patient was then put under general anesthesia by the Department of Anesthesiology. The diagnostic JB 1 catheter in the right subclavian artery distally was exchanged over a 0.035 inch 300 cm Rosen exchange guidewire for an 85 cm 8 [place] Neuron Max guide sheath. This was advanced with its distal tip distal to the origin of the right vertebral artery. This  was then connected to continuous heparinized saline infusion. Over the [employer] guidewire, a 5 French 115 cm Catalyst guide catheter was then advanced and positioned distal to the tip of the Neuron Max sheath. The guidewire was removed. Good aspiration obtained from the hub of the 5 [place] Catalyst guide catheter. The combination was then  retrieved more proximally and proximal to the origin of the right vertebral artery. Using biplane roadmap technique and constant fluoroscopic guidance, a 0.035 inch Roadrunner guidewire was then advanced through the Catalyst guide catheter. Access into the right vertebral artery was obtained. The wire was advanced to the distal right vertebral artery followed by the Catalyst guide catheter. Also at this time, the 8 [place] Neuron Max guide sheath was advanced to the proximal 1/3 of the right vertebral artery. The guidewire was removed. Good aspiration obtained from the hub of the Catalyst guide catheter. A gentle control arteriogram performed through the Catalyst guide catheter demonstrated no evidence of spasms, dissections or of intraluminal filling defects. Arteriogram was then performed in the biplane DSA mode of the basilar artery. Measurements were performed of the right vertebral artery proximal to the right PICA, and distal to the right posterior-inferior cerebellar artery and the distal right vertebrobasilar junction. Again noted was a nubbin arising from the lateral superior wall of the right vertebrobasilar junction distal to the origin of the right posterior-inferior cerebellar artery. 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 An 027 Phenom 150 cm microcatheter was then advanced over a 0.014 inch Softip Synchro micro guidewire to the distal end of the Catalyst guide catheter. With the micro guidewire leading with a J-tip configuration, access was obtained into the origin of the right posterior-inferior cerebellar artery. The Phenom microcatheter was then advanced to close proximity to the origin of the right posterior-inferior cerebellar artery. Using a torque device, the wire was then manipulated to advance passed the wide neck aneurysm of the right posterior-inferior cerebellar artery. Access distal to the neck of the  aneurysm could not be achieved with the micro guidewire. Therefore, this was then abandoned. The Phenom microcatheter was then advanced to the distal basilar artery just proximal to the origin of the posterior cerebral arteries over a 0.014 inch Softip Synchro micro guidewire. The guidewire was removed. Good aspiration obtained from the hub of the Phenom 027 microcatheter. A gentle contrast injection demonstrated safe position of the tip of the microcatheter. This was then connected to continuous heparinized saline infusion. Following evaluation of the 3D images, and the diagnostic catheter arteriograms, it was decided to use a 4.5 mm x 35 mm pipeline flex flow diverter device across the neck of the right posterior-inferior cerebellar artery. This was then advanced in a coaxial manner and with constant heparinized saline infusion using biplane roadmap technique to the distal end of the tip of the Phenom microcatheter. The microcatheter was then retrieved unsheathing the distal wire tip of the device including a few mm of the device itself. Unsheathing was continued until the device started opening in its distal aspect. This was then retrieved more proximally to the distal landing zone which was just proximal to the entry point of the contralateral left vertebral artery. Once there, the remainder of the device was then deployed by advancing the microcatheter using the fluffing technique with the microcatheter to promote apposition with the native vessel. The Phenom microcatheter was then advanced over the micro guidewire into the distal basilar artery. Using a  torque device, the micro guidewire was then captured into the microcatheter and removed. The microcatheter was connected to continuous heparinized saline infusion. A control arteriogram performed through the 5 [place] Catalyst guide catheter in the distal right vertebral artery demonstrated excellent apposition proximally and distally with moderate slow flow  through the right posterior-inferior cerebellar artery and filling of the aneurysm. Control arteriograms were then performed at 15 and 30 minutes post deployment of the initial device. These continued to demonstrate excellent apposition without evidence of intraluminal filling defects or of occlusions. However, there continued to be a modest jet like innominate along the inferior aspect of the aneurysm. This prompted the use of a second flow diverter device. It was decided to use a 4.5 mm x 20 mm pipeline flex flow diverter device. This was again advanced to the distal end of the Phenom 027 microcatheter using biplane roadmap technique and constant fluoroscopic guidance in a coaxial manner and with constant heparinized saline infusion. This in turn was then visualized by retrieving the microcatheter. Once the distal 3 mm of the device was opened, the combination was then retrieved such that the landing zone of this was going to be in the proximal horizontal segment just distal to the right posterior-inferior cerebellar artery. Once in that position,, again using the pushing technique with the micro guidewire to deploy and the fluffing technique to stabilize the device the combination was utilized to deploy the entire device. Once completely deployed, a control arteriogram performed through the Catalyst guide catheter in the distal right vertebral artery demonstrated excellent apposition proximally and distally. There was double coverage at the origin of the right posterior-inferior cerebellar artery. A control arteriogram performed through the Catalyst guide catheter at 10 and 20 minutes post deployment of the second device continued to demonstrate excellent apposition with now slower flow in the region of the neck of the aneurysm. However, patency of the right posterior-inferior cerebellar artery continued to be maintained. A final control arteriogram performed through the 5 [place] Catalyst guide catheter in the  right vertebral artery demonstrated excellent apposition without evidence of intraluminal filling defects or of occlusions, in the posterior cerebral circulation. Throughout the procedure, the patient's blood pressure and neurological status remained stable. The patient's ACT was in the region of 297. This prompted the use of approximately 15 mg of protamine sulfate to partially reverse the effect of the heparin. Thereafter, the 5 [place] Catalyst guide catheter, and the 8 [place] Neuron Max sheath were then retrieved into the abdominal aorta and exchanged over a J-tip guidewire for a 6 [place] Pinnacle sheath. This was then removed with hemostasis achieved manually in the right groin at the puncture site over 30 minutes. The patient's general anesthesia was then reversed and the patient was then extubated without difficulty. Upon recovery the patient demonstrated no new neurological signs or symptoms. She did not complain of any nausea. She did complain of mild headache. She was able to move all her fours equally proportional to the effort. Distal pulses remained palpable in the dorsalis pedis and posterior tibial regions bilaterally at the end of the procedure. She was then transferred to the neuro ICU to continue on low-dose IV heparin, close neurologic observations, and maintaining her adequate blood pressure. The patient did develop oozing from her right groin whilst being transferred from the gurney to her bed. Pressure was held for about 15-20 minutes with hemostasis. A pressure bandage was applied. Overnight the patient complained of nausea with mild headache. This responded to 12.5  mg of Phenergan rectal suppository. She was able to take in free liquids in small amounts. The following morning, the IV heparin was stopped, the patient was switched to aspirin 81 mg a day, and Plavix 75 mg a day. Again while being sat up the patient had small oozing through the right groin puncture site. Again pressure was held  for about 10 minutes. The patient was to be laid flat for 2 hours. Also encouraged was for the patient to start eating and drinking. The patient's potassium was also 3.1. This was replaced with oral potassium. The patient and her daughter were advised that upon her discharge, the patient was to rest overnight lying flat. She could ambulate using a walker or a cane the following morning. She was advised to maintain adequate hydration by drinking water. She was strongly advised to continue taking her aspirin 81 mg a day, and Plavix. She was to avoid stooping, bending or lifting weights above 10 pounds for a couple of weeks. She was also advised not to drive for a couple of weeks. The daughter was informed that should there be a slight oozing in the right groin puncture site to hold maintained pressure for approximately 10-15 minutes and keep the patient flat. Should she develop new neurological signs or symptoms or worsening headaches, visual problems, motor weakness, to call 911. The patient will be seen in follow-up in outpatient clinic in 2 weeks post discharge. 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 PLAN: Follow-up in the clinic in 2 weeks time. Electronically Signed   By: Luanne Bras M.D.   On: 09/06/2018 11:50      Assessment & Plan  Right facial cellulitis versus herpes zoster Started around 1 week ago but will treat for herpes due to the extent of the infection and the proximity to the right eye. Discussed with ophthalmology who will see the patient today or tomorrow in a.m. Until then we will start the patient on IV acyclovir and IV vancomycin and Zosyn. If the eye is not involved, would change acyclovir to p.o.  History of headaches with history of brain aneurysm Status post CT scan with no acute findings 2 weeks  ago  Basal cell carcinoma of the left eye status post surgery in the past    DVT Prophylaxis Lovenox  AM Labs Ordered, also please review Full Orders  Family Communication: Admission, patients condition and plan of care including tests being ordered have been discussed with the patient and son who indicate understanding and agree with the plan and Code Status.  Code Status full  Disposition Plan: Home  Time spent in minutes : 41 minutes  Condition GUARDED   @SIGNATURE @

## 2018-10-03 NOTE — ED Provider Notes (Signed)
Medical screening examination/treatment/procedure(s) were conducted as a shared visit with non-physician practitioner(s) and myself.  I personally evaluated the patient during the encounter.  Suspect patient had HSV initially and now has superinfection with cellulitis. Exam without pain with EOM ROM low suspicion for septal cellulitis.  Ct to eval for neck space infection as erythema is traveling down that way. Recently had angiogram and emoblization, so will add on cta (headache as well). Likely admit with abx.   None     Chasidy Janak, Corene Cornea, MD 10/04/18 [id number]

## 2018-10-03 NOTE — Progress Notes (Addendum)
Pharmacy Antibiotic Note  Elaine Rivera is a 74 y.o. female admitted on 10/03/2018 with R-eye pain/rash concerning for cellulitis vs shingles. Pharmacy has been consulted for Vancomycin + Zosyn + Acyclovir dosing.  MD has discussed Acyclovir IV with ID and Dr. Megan Salon has approved its use in this patient.   Vancomycin 750 mg IV Q 24 hrs. Goal AUC 400-550. Expected AUC: 515 SCr used: 0.8  Plan: - Vancomycin 1g IV x 1 dose followed by 750 mg IV every 24 hours - Zosyn 3.375g IV every 8 hours (infused over 4 hours) - Start Acyclovir 500 mg (~10 mg/kg) IV every 8 hours - Will continue to follow renal function, culture results, LOT, and antibiotic de-escalation plans      Temp (24hrs), Avg:97.6 F (36.4 C), Min:97.6 F (36.4 C), Max:97.6 F (36.4 C)  Recent Labs  Lab 10/03/18 1303 10/03/18 1409  WBC 5.7  --   CREATININE 0.76  --   LATICACIDVEN  --  0.93    CrCl cannot be calculated (Unknown ideal weight.).    Allergies  Allergen Reactions  . Sulfa Antibiotics Other (See Comments)    Reaction:  Unknown     Antimicrobials this admission: Vanc 1/6 >> Zosyn 1/6 >> Acyclovir 1/6 >>  Dose adjustments this admission: n/a  Microbiology results: 1/6 BCx >>  Thank you for allowing pharmacy to be a part of this patient's care.  Alycia Rossetti, PharmD, BCPS Clinical Pharmacist Please check AMION for all [place] numbers 10/03/2018 3:40 PM

## 2018-10-03 NOTE — ED Provider Notes (Signed)
[facility] EMERGENCY DEPARTMENT Provider Note   CSN: [id number] Arrival date & time: 10/03/18  1120     History   Chief Complaint Chief Complaint  Patient presents with  . Headache  . Herpes Zoster    HPI Elaine Rivera is a 73 y.o. female.  HPI   Elaine Rivera is a 73 y.o. female, with a history of GERD, brain aneurysm, and HTN, presenting to the ED with rash to the face for the last 4-5 days.  She states it started as a "boil" under the right eye and spread from there.  She notes soreness only with palpation. She has pain in the right side of her mouth and a right-sided sore throat. She also complains of a headache that she describes as a soreness, moderate to severe, intermittent, radiates through the right side of her head into her neck.  This headache has been recurring since her embolization of her aneurysm performed September 05, 2018. Patient was seen at an urgent care January 3 and was prescribed Augmentin and triamcinolone, but rash continued to worsen and spread.  Denies fever, vision changes, neurologic dysfunction, difficulty breathing or swallowing, chest pain, abdominal pain, N/V/D, neck stiffness, syncope, or any other complaints.   Past Medical History:  Diagnosis Date  . Anemia    years ago  . Arthritis   . Basal cell carcinoma of eye    biopsy of left eye/ non cancerous  . Complication of anesthesia    nausea/vomiting  . Depression   . GERD (gastroesophageal reflux disease)   . History of kidney stones   . Hypertension    no longer on medications    Patient Active Problem List   Diagnosis Date Noted  . Brain aneurysm 09/05/2018  . Symptomatic anemia 04/20/2016  . NSAID long-term use 04/20/2016  . Scoliosis 04/20/2016  . Depression 04/20/2016  . HTN (hypertension) 04/20/2016  . GERD (gastroesophageal reflux disease) 04/20/2016  . Occult GI bleeding 04/20/2016    Past Surgical History:  Procedure Laterality Date  .  CEREBRAL ANEURYSM REPAIR     1998  . COLONOSCOPY    . ECTOPIC PREGNANCY SURGERY     x2   . ESOPHAGOGASTRODUODENOSCOPY N/A 04/22/2016   Procedure: ESOPHAGOGASTRODUODENOSCOPY (EGD);  Surgeon: Juanita Craver, MD;  Location: [facility];  Service: Endoscopy;  Laterality: N/A;  . IR 3D INDEPENDENT WKST  09/05/2018  . IR ANGIO INTRA EXTRACRAN SEL COM CAROTID INNOMINATE BILAT MOD SED  09/05/2018  . IR ANGIO VERTEBRAL SEL VERTEBRAL UNI R MOD SED  09/05/2018  . IR ANGIOGRAM FOLLOW UP STUDY  09/05/2018  . IR TRANSCATH/EMBOLIZ  09/05/2018  . PARATHYROIDECTOMY     2002  . RADIOLOGY WITH ANESTHESIA N/A 09/05/2018   Procedure: EMBOLIZATION;  Surgeon: Radiologist, Medication, MD;  Location: [facility];  Service: Radiology;  Laterality: N/A;  . TONSILLECTOMY     1953  . VAGINAL HYSTERECTOMY     2001  . VOCAL CORD LATERALIZATION, ENDOSCOPIC APPROACH W/ MLB     2007 (@[facility] )  . WRIST SURGERY     left side/2008  . WRIST SURGERY     right side/ 2002     OB History   No obstetric history on file.      Home Medications    Prior to Admission medications   Medication Sig Start Date End Date Taking? Authorizing Provider  ALPRAZolam Duanne Moron) 0.5 MG tablet Take 0.5 mg by mouth daily as needed for anxiety.   Yes [provider]  amoxicillin-clavulanate (AUGMENTIN) 875-125 MG tablet Take 1 tablet by mouth 2 (two) times daily.   Yes [provider]  aspirin EC 81 MG tablet Take 81 mg by mouth daily.    Yes [provider]  Calcium Carbonate-Vitamin D (CALCIUM 600+D PO) Take 1 tablet by mouth at bedtime.    Yes [provider]  cholecalciferol (VITAMIN D3) 25 MCG (1000 UT) tablet Take 1,000 Units by mouth at bedtime.    Yes [provider]  clopidogrel (PLAVIX) 75 MG tablet Take 75 mg by mouth every evening.   Yes [provider]  DULoxetine (CYMBALTA) 60 MG capsule Take 120 mg by mouth at bedtime.   Yes [provider]  naproxen sodium (ALEVE) 220 MG  tablet Take 440 mg by mouth 2 (two) times daily as needed (pain).   Yes [provider]  QUEtiapine (SEROQUEL) 25 MG tablet Take 25 mg by mouth at bedtime.   Yes [provider]  triamcinolone cream (KENALOG) 0.1 % Apply 1 application topically 3 (three) times daily. Apply to the rash on face   Yes [provider]    Family History No family history on file.  Social History Social History   Tobacco Use  . Smoking status: Never Smoker  . Smokeless tobacco: Never Used  Substance Use Topics  . Alcohol use: No  . Drug use: Never     Allergies   Sulfa antibiotics   Review of Systems Review of Systems  Constitutional: Negative for chills and fever.  HENT: Positive for facial swelling and sore throat. Negative for drooling, trouble swallowing and voice change.   Eyes: Negative for pain, discharge and visual disturbance.  Respiratory: Negative for cough and shortness of breath.   Cardiovascular: Negative for chest pain.  Gastrointestinal: Negative for abdominal pain, diarrhea, nausea and vomiting.  Skin: Positive for rash.  Neurological: Positive for headaches. Negative for dizziness, syncope, weakness, light-headedness and numbness.  All other systems reviewed and are negative.    Physical Exam Updated Vital Signs BP 137/70 (BP Location: Right Arm)   Pulse (!) 116   Temp 97.6 F (36.4 C) (Oral)   Resp 18   SpO2 99%   Physical Exam Vitals signs and nursing note reviewed.  Constitutional:      General: She is not in acute distress.    Appearance: She is well-developed. She is not diaphoretic.  HENT:     Head: Normocephalic and atraumatic.     Comments: Erythema, increased warmth, tenderness, and swelling to the right side of the face. No noted pustules or vesicles. Swelling extends to the lips on the right side. No noted intraoral swelling or lesions.    Right Ear: Tympanic membrane, ear canal and external ear normal. No swelling.     Left  Ear: Tympanic membrane, ear canal and external ear normal. No swelling.     Ears:     Comments: No ear canal lesions noted.    Nose: Nasal tenderness present.     Comments: Some mild swelling and tenderness noted to the right of the nose.    Mouth/Throat:     Mouth: Mucous membranes are moist.     Pharynx: Oropharynx is clear.  Eyes:     Extraocular Movements: Extraocular movements intact.     Conjunctiva/sclera: Conjunctivae normal.     Pupils: Pupils are equal, round, and reactive to light.     Comments: No contact lenses in place.  Slit lamp exam was also performed  with no noted signs of corneal abrasion or ulcer, iritis, anterior chamber damage, foreign bodies, or globe damage.  No noted dendritic lesions noted. No noted cells or flare noted. Tono-Pen was not properly functioning, therefore intraocular pressures were unable to be obtained.  Neck:     Musculoskeletal: Normal range of motion and neck supple. No neck rigidity.     Meningeal: Brudzinski's sign and Kernig's sign absent.   Cardiovascular:     Rate and Rhythm: Normal rate and regular rhythm.     Pulses: Normal pulses.     Heart sounds: Normal heart sounds.  Pulmonary:     Effort: Pulmonary effort is normal. No respiratory distress.     Breath sounds: Normal breath sounds.  Abdominal:     Palpations: Abdomen is soft.     Tenderness: There is no abdominal tenderness. There is no guarding.  Musculoskeletal:     Right lower leg: No edema.     Left lower leg: No edema.  Lymphadenopathy:     Cervical: No cervical adenopathy.  Skin:    General: Skin is warm and dry.  Neurological:     Mental Status: She is alert and oriented to person, place, and time.     Comments: Sensation grossly intact to light touch in the extremities. Strength 5/5 in all extremities. No gait disturbance. Coordination intact. Cranial nerves III-XII grossly intact. No facial droop.   Psychiatric:        Behavior: Behavior normal.                  ED Treatments / Results  Labs (all labs ordered are listed, but only abnormal results are displayed) Labs Reviewed  CULTURE, BLOOD (ROUTINE X 2)  CULTURE, BLOOD (ROUTINE X 2)  COMPREHENSIVE METABOLIC PANEL  CBC WITH DIFFERENTIAL/PLATELET  I-STAT CG4 LACTIC ACID, ED  I-STAT CG4 LACTIC ACID, ED    EKG None  Radiology Ct Angio Head W Or Wo Contrast  Result Date: 10/03/2018 CLINICAL DATA:  Patient has a persistent headache following aneurysm embolization. Patient also has infection to her face along with sore throat. Looking for possible infection spreading to soft tissues of the neck. Status post endovascular treatment of wide neck PICA aneurysm with telescoping pipeline diverting stents. EXAM: CT ANGIOGRAPHY HEAD AND NECK TECHNIQUE: Multidetector CT imaging of the head and neck was performed using the standard protocol during bolus administration of intravenous contrast. Multiplanar CT image reconstructions and MIPs were obtained to evaluate the vascular anatomy. Carotid stenosis measurements (when applicable) are obtained utilizing NASCET criteria, using the distal internal carotid diameter as the denominator. CONTRAST:  159mL ISOVUE-370 IOPAMIDOL (ISOVUE-370) INJECTION 76% COMPARISON:  Cerebral angiogram with endovascular stent placement 09/05/2018. CT head 09/20/2012. CTA head neck 08/30/2018. FINDINGS: CT HEAD FINDINGS Brain: No evidence for acute infarction, hemorrhage, mass lesion, or extra-axial fluid. Hydrocephalus, up lifted corpus callosum, hypoattenuating white matter and effaced cortical sulci all features of normal pressure hydrocephalus. The thrombosed hyperdense portion of the aneurysm is smaller, now 15 x 11 mm cross-section as seen on series 5, image 7 as compared with previous cross-section of 16 x 13 mm. There is less mass effect on the brainstem and fourth ventricle. Vascular: Reported separately. Skull: Previous suboccipital craniectomy to the  RIGHT. Sinuses: Retention cysts. Orbits: Negative Review of the MIP images confirms the above findings CTA NECK FINDINGS Aortic arch: Standard branching. Imaged portion shows no evidence of aneurysm or dissection. No significant stenosis of the major arch vessel origins. Aortic atherosclerosis. Right carotid  system: No evidence of dissection, stenosis (50% or greater) or occlusion. Left carotid system: No evidence of dissection, stenosis (50% or greater) or occlusion. Vertebral arteries: Mildly dominant RIGHT vertebral. Nonstenotic plaque at the LEFT vertebral ostium. Skeleton: Spondylosis. Other neck: Asymmetric medialization RIGHT vocal cord is redemonstrated. Generalized prominence of BILATERAL level 2 lymph nodes, likely reactive. Partial LEFT thyroidectomy. Upper chest: Apical scarring. No mass or pneumothorax. Review of the MIP images confirms the above findings CTA HEAD FINDINGS Anterior circulation: No significant stenosis, proximal occlusion, aneurysm, or vascular malformation. Posterior circulation: Dominant RIGHT vertebral artery has been treated with pipeline devices and remains widely patent. Wide neck PICA aneurysm is redemonstrated, not enlarged, and no associated subarachnoid blood. Measurements of the residual aneurysmal filling are 6 x 6 x 6 mm. Venous sinuses: As permitted by contrast timing, patent. Anatomic variants: Hypoplastic RIGHT P1 segment. No branch occlusion. Delayed phase: The post infusion appearance of the thrombosed portion of the aneurysm yields a cross-section of 16 x 15. No abnormal intracranial enhancement. Review of the MIP images confirms the above findings IMPRESSION: 1. Satisfactory appearance status post RIGHT vertebral artery pipeline devices and PICA aneurysm coiling. 2. No extracranial or intracranial stenosis of significance. 3. Decreased size of the thrombosed aneurysm appearance on noncontrast scan, now 15 x 11 mm cross-section. The portion of the PICA aneurysm which  fills is unchanged, 6 x 6 x 6 mm. 4. No acute intracranial findings are evident. Specifically no subarachnoid hemorrhage. 5. Other than reactive lymphadenopathy, no concerning findings in the neck of abscess or abnormal fluid collection. 6. Findings reviewed with Dr. Estanislado Pandy, who concurs. Electronically Signed   By: Staci Righter M.D.   On: 10/03/2018 16:25   Dg Chest 2 View  Result Date: 10/03/2018 CLINICAL DATA:  Facial infection EXAM: CHEST - 2 VIEW COMPARISON:  09/20/2012 FINDINGS: Cardiac shadow is stable. Aortic calcifications are again seen. The lungs are well aerated bilaterally. Stable elevation of left hemidiaphragm is noted with blunting of left costophrenic angle similar to that seen on prior exam. Hiatal hernia is noted as well. No bony abnormality is seen aside from the known stable scoliosis. IMPRESSION: Chronic changes in the left lung base in part due to elevation of the left hemidiaphragm as well as hiatal hernia. No acute abnormality is noted. Electronically Signed   By: Inez Catalina M.D.   On: 10/03/2018 14:53   Ct Angio Neck W And/or Wo Contrast  Result Date: 10/03/2018 CLINICAL DATA:  Patient has a persistent headache following aneurysm embolization. Patient also has infection to her face along with sore throat. Looking for possible infection spreading to soft tissues of the neck. Status post endovascular treatment of wide neck PICA aneurysm with telescoping pipeline diverting stents. EXAM: CT ANGIOGRAPHY HEAD AND NECK TECHNIQUE: Multidetector CT imaging of the head and neck was performed using the standard protocol during bolus administration of intravenous contrast. Multiplanar CT image reconstructions and MIPs were obtained to evaluate the vascular anatomy. Carotid stenosis measurements (when applicable) are obtained utilizing NASCET criteria, using the distal internal carotid diameter as the denominator. CONTRAST:  133mL ISOVUE-370 IOPAMIDOL (ISOVUE-370) INJECTION 76% COMPARISON:   Cerebral angiogram with endovascular stent placement 09/05/2018. CT head 09/20/2012. CTA head neck 08/30/2018. FINDINGS: CT HEAD FINDINGS Brain: No evidence for acute infarction, hemorrhage, mass lesion, or extra-axial fluid. Hydrocephalus, up lifted corpus callosum, hypoattenuating white matter and effaced cortical sulci all features of normal pressure hydrocephalus. The thrombosed hyperdense portion of the aneurysm is smaller, now 15 x 11 mm  cross-section as seen on series 5, image 7 as compared with previous cross-section of 16 x 13 mm. There is less mass effect on the brainstem and fourth ventricle. Vascular: Reported separately. Skull: Previous suboccipital craniectomy to the RIGHT. Sinuses: Retention cysts. Orbits: Negative Review of the MIP images confirms the above findings CTA NECK FINDINGS Aortic arch: Standard branching. Imaged portion shows no evidence of aneurysm or dissection. No significant stenosis of the major arch vessel origins. Aortic atherosclerosis. Right carotid system: No evidence of dissection, stenosis (50% or greater) or occlusion. Left carotid system: No evidence of dissection, stenosis (50% or greater) or occlusion. Vertebral arteries: Mildly dominant RIGHT vertebral. Nonstenotic plaque at the LEFT vertebral ostium. Skeleton: Spondylosis. Other neck: Asymmetric medialization RIGHT vocal cord is redemonstrated. Generalized prominence of BILATERAL level 2 lymph nodes, likely reactive. Partial LEFT thyroidectomy. Upper chest: Apical scarring. No mass or pneumothorax. Review of the MIP images confirms the above findings CTA HEAD FINDINGS Anterior circulation: No significant stenosis, proximal occlusion, aneurysm, or vascular malformation. Posterior circulation: Dominant RIGHT vertebral artery has been treated with pipeline devices and remains widely patent. Wide neck PICA aneurysm is redemonstrated, not enlarged, and no associated subarachnoid blood. Measurements of the residual aneurysmal  filling are 6 x 6 x 6 mm. Venous sinuses: As permitted by contrast timing, patent. Anatomic variants: Hypoplastic RIGHT P1 segment. No branch occlusion. Delayed phase: The post infusion appearance of the thrombosed portion of the aneurysm yields a cross-section of 16 x 15. No abnormal intracranial enhancement. Review of the MIP images confirms the above findings IMPRESSION: 1. Satisfactory appearance status post RIGHT vertebral artery pipeline devices and PICA aneurysm coiling. 2. No extracranial or intracranial stenosis of significance. 3. Decreased size of the thrombosed aneurysm appearance on noncontrast scan, now 15 x 11 mm cross-section. The portion of the PICA aneurysm which fills is unchanged, 6 x 6 x 6 mm. 4. No acute intracranial findings are evident. Specifically no subarachnoid hemorrhage. 5. Other than reactive lymphadenopathy, no concerning findings in the neck of abscess or abnormal fluid collection. 6. Findings reviewed with Dr. Estanislado Pandy, who concurs. Electronically Signed   By: Staci Righter M.D.   On: 10/03/2018 16:25    Procedures Procedures (including critical care time)  Medications Ordered in ED Medications  iopamidol (ISOVUE-370) 76 % injection (has no administration in time range)  vancomycin (VANCOCIN) IVPB 1000 mg/200 mL premix (has no administration in time range)  fluorescein ophthalmic strip 1 strip (has no administration in time range)  tetracaine (PONTOCAINE) 0.5 % ophthalmic solution 2 drop (has no administration in time range)  vancomycin (VANCOCIN) IVPB 750 mg/150 ml premix (has no administration in time range)  iopamidol (ISOVUE-370) 76 % injection 100 mL (100 mLs Intravenous Contrast Given 10/03/18 1530)     Initial Impression / Assessment and Plan / ED Course  I have reviewed the triage vital signs and the nursing notes.  Pertinent labs & imaging results that were available during my care of the patient were reviewed by me and considered in my medical decision  making (see chart for details).  Clinical Course as of Oct 04 1735  Mon Oct 03, 2018  1155 Patient not yet in the room.   [SJ]  [id number] Spoke with Dr. Bennie Dallas. States patient had these symptoms of headache even when she saw him for follow up in December.  He suspects these headache symptoms may be chronic and may be coming from her cervical spine.  He had suggested to the patient that she have  her PCP schedule her for MRI cervical spine.  If CTA head and neck today are unremarkable, patient does not need anything further from his standpoint here in the ED.   [SJ]  41 Spoke with Dr. Starla Link, hospitalist. He states he will not admit the patient until all imaging studies are back.    [SJ]  55 Spoke with Dr. Laren Everts, hospitalist. Agrees to admit the patient. Agrees with decision to not start Acyclovir or similar antiviral at this time. He will assess patient.   [SJ]    Clinical Course User Index [SJ] Joy, Shawn C, PA-C    Patient presents with pain, swelling, and redness to the right side of the face.  Suspicion for possible cellulitis.  One possibility is a bacterial superinfection stemming from herpes zoster infection. Nontoxic-appearing, afebrile, not tachycardic, not tachypneic, not hypotensive.  No noted airway compromise.  CT does not show fluid collection or abscess. Vascular findings on CT were reviewed with Dr. Bennie Dallas by the radiologist.   Patient admitted for IV antibiotics and failure of outpatient therapy for her likely facial cellulitis.  Findings and plan of care discussed with Merrily Pew, MD. Dr. Dayna Barker personally evaluated and examined this patient.  Vitals:   10/03/18 1630 10/03/18 1700 10/03/18 1715 10/03/18 1730  BP: 139/88     Pulse: 88 92 89 92  Resp: (!) 21 16 (!) 23 14  Temp:      TempSrc:      SpO2: 98% 99% 97% 98%     Final Clinical Impressions(s) / ED Diagnoses   Final diagnoses:  Facial cellulitis    ED Discharge Orders    None       Layla Maw 10/03/18 1741    Mesner, Corene Cornea, MD 10/05/18 1323

## 2018-10-04 DIAGNOSIS — R21 Rash and other nonspecific skin eruption: Secondary | ICD-10-CM | POA: Diagnosis not present

## 2018-10-04 DIAGNOSIS — B009 Herpesviral infection, unspecified: Secondary | ICD-10-CM | POA: Diagnosis present

## 2018-10-04 DIAGNOSIS — R51 Headache: Secondary | ICD-10-CM

## 2018-10-04 DIAGNOSIS — Z881 Allergy status to other antibiotic agents status: Secondary | ICD-10-CM | POA: Diagnosis not present

## 2018-10-04 LAB — CBC
HCT: 35.3 % — ABNORMAL LOW (ref 36.0–46.0)
Hemoglobin: 11.3 g/dL — ABNORMAL LOW (ref 12.0–15.0)
MCH: 29.4 pg (ref 26.0–34.0)
MCHC: 32 g/dL (ref 30.0–36.0)
MCV: 91.7 fL (ref 80.0–100.0)
Platelets: 240 10*3/uL (ref 150–400)
RBC: 3.85 MIL/uL — ABNORMAL LOW (ref 3.87–5.11)
RDW: 12.7 % (ref 11.5–15.5)
WBC: 4.9 10*3/uL (ref 4.0–10.5)
nRBC: 0 % (ref 0.0–0.2)

## 2018-10-04 LAB — BASIC METABOLIC PANEL
Anion gap: 9 (ref 5–15)
BUN: 11 mg/dL (ref 8–23)
CO2: 24 mmol/L (ref 22–32)
Calcium: 9 mg/dL (ref 8.9–10.3)
Chloride: 104 mmol/L (ref 98–111)
Creatinine, Ser: 0.73 mg/dL (ref 0.44–1.00)
GFR calc non Af Amer: >60 mL/min (ref >60)
Glucose, Bld: 112 mg/dL — ABNORMAL HIGH (ref 70–99)
Potassium: 3.6 mmol/L (ref 3.5–5.1)
Sodium: 137 mmol/L (ref 135–145)

## 2018-10-04 MED ORDER — MUPIROCIN 2 % EX OINT: TOPICAL_OINTMENT | CUTANEOUS | 0 refills | Status: AC

## 2018-10-04 MED ORDER — BACITRACIN-POLYMYXIN B 500-10000 UNIT/GM OP OINT
TOPICAL_OINTMENT | Freq: Two times a day (BID) | OPHTHALMIC | Status: DC
  Administered 2018-10-04: 15:00:00 via OPHTHALMIC
  Filled 2018-10-04: qty 3.5

## 2018-10-04 MED ORDER — BACITRACIN-POLYMYXIN B 500-10000 UNIT/GM OP OINT: TOPICAL_OINTMENT | Freq: Two times a day (BID) | OPHTHALMIC | 0 refills | Status: AC

## 2018-10-04 MED ORDER — VALACYCLOVIR HCL 1 G PO TABS: 1000.0000 mg | ORAL_TABLET | Freq: Three times a day (TID) | ORAL | 0 refills | Status: AC

## 2018-10-04 MED ORDER — DEXTROSE 5 % IV SOLN
500.0000 mg | Freq: Two times a day (BID) | INTRAVENOUS | Status: DC
  Filled 2018-10-04: qty 10

## 2018-10-04 NOTE — Progress Notes (Signed)
Pharmacy Antibiotic Note  Elaine Rivera is a 73 y.o. female admitted on 10/03/2018 with right eye pain/rash concerning for cellulitis vs shingles. Pharmacy has been consulted for acyclovir dosing.  MD has discussed acyclovir IV with ID and Dr. Megan Salon approved its use.  Noted documentation of crusting vesicles.  Renal function is stable.  Given patient's height and weight, estimated CrCL is ~43 ml/min.  Afebrile, WBC WNL.   Plan: Change acyclovir 500mg  IV Q12H (~10 mg/kg/dose) Monitor renal fxn, clinical progress, Vanc/Zosyn LOT   Weight: 110 lb 0.2 oz (49.9 kg)  Temp (24hrs), Avg:98.2 F (36.8 C), Min:98.2 F (36.8 C), Max:98.2 F (36.8 C)  Recent Labs  Lab 10/03/18 1303 10/03/18 1409 10/04/18 0506  WBC 5.7  --  4.9  CREATININE 0.76  --  0.73  LATICACIDVEN  --  0.93  --     Estimated Creatinine Clearance: 43.4 mL/min (by C-G formula based on SCr of 0.73 mg/dL).    Allergies  Allergen Reactions  . Sulfa Antibiotics Other (See Comments)    Reaction:  Unknown     Vanc 1/6 >> Zosyn 1/6 >> Acyclovir 1/6 >>  1/6 BCx -    Anvita Hirata D. Mina Marble, PharmD, BCPS, [place] 10/04/2018, 1:46 PM

## 2018-10-04 NOTE — Progress Notes (Signed)
Patient discharged to home with instructions, transported by wheelchair.

## 2018-10-04 NOTE — Progress Notes (Signed)
PROGRESS NOTE    Elaine Rivera  STM:[id number] DOB: 1945-10-08 DOA: 10/03/2018 PCP: Carol Ada, MD   Brief Narrative: Patient is a 73 year old female with past medical history of left eye basal cell carcinoma, cerebral AVM, hypertension not on medication who presented to the emergency department with 1 week history of right facial vesicular lesions that started progressing.  It also involves her lower eyelid.  Started as a single vesicular lesion on the right cheekbone which gradually progressed.  Found to have herpes zoster with facial involvement.  He started on acyclovir and other antibiotics.  Ophthalmology consulted.  Assessment & Plan:   Active Problems:   Facial cellulitis  Herpes Zooster/cellulitis of the right face: Involving V2 dermatome.  Started about a week ago.  Failed antibiotic treatment as an outpatient. She has history of vaginal herpes. Started on acyclovir and other broad-spectrum antibiotics.  Lesions are improving. Ophthalmology consulted.  No involvement of her eye.  Mild involvement of the right lower lid.  Ophthalmology recommended to use Polysporin ophthalmic ointment to the lower lid lesion I have also requested infectious disease consultation.  History of headache/history of brain aneurysms: Stable.  Underwent IR procedure for posterior inferior cerebral artery rhythm on 09/05/2018.  History of basal cell carcinoma of the left eye: Currently stable.S/P surgery         DVT prophylaxis: Lovenox Code Status: Full Family Communication: None present at the bed side Disposition Plan: Home in 1-2 days   Consultants: Ophthalmology, requested ID consultation  Procedures:None  Antimicrobials: Commencing, Zosyn, acyclovir  Subjective: Patient seen and examined the bedside this morning.  Hemodynamically stable.  Right face lesions seem to have improved.  No problem with the vision on the right side.  Pain has improved.  Objective: Vitals:   10/03/18  1930 10/04/18 0017 10/04/18 0621 10/04/18 1300  BP:  120/76 126/83   Pulse: 84 (!) 103 92   Resp: (!) 23     Temp:  98.2 F (36.8 C) 98.2 F (36.8 C)   TempSrc:  Oral Oral   SpO2: 98% 96% 97%   Weight:    49.9 kg    Intake/Output Summary (Last 24 hours) at 10/04/2018 1406 Last data filed at 10/04/2018 0959 Gross per 24 hour  Intake 780 ml  Output -  Net 780 ml   Filed Weights   10/04/18 1300  Weight: 49.9 kg    Examination:  General exam: Appears calm and comfortable ,Not in distress,average built HEENT:PERRL,Oral mucosa moist, Ear/Nose normal on gross exam Vesicular lesions with crusting, erythema on the right upper face below the right eye Respiratory system: Bilateral equal air entry, normal vesicular breath sounds, no wheezes or crackles  Cardiovascular system: S1 & S2 heard, RRR. No JVD, murmurs, rubs, gallops or clicks. No pedal edema. Gastrointestinal system: Abdomen is nondistended, soft and nontender. No organomegaly or masses felt. Normal bowel sounds heard. Central nervous system: Alert and oriented. No focal neurological deficits. Extremities: No edema, no clubbing ,no cyanosis, distal peripheral pulses palpable. Skin: No rashes, lesions or ulcers,no icterus ,no pallor MSK: Normal muscle bulk,tone ,power Psychiatry: Judgement and insight appear normal. Mood & affect appropriate.     Data Reviewed: I have personally reviewed following labs and imaging studies  CBC: Recent Labs  Lab 10/03/18 1303 10/04/18 0506  WBC 5.7 4.9  NEUTROABS 3.8  --   HGB 12.1 11.3*  HCT 38.8 35.3*  MCV 91.9 91.7  PLT 240 892   Basic Metabolic Panel: Recent Labs  Lab  10/03/18 1303 10/04/18 0506  NA 138 137  K 3.7 3.6  CL 102 104  CO2 26 24  GLUCOSE 91 112*  BUN 12 11  CREATININE 0.76 0.73  CALCIUM 9.4 9.0   GFR: Estimated Creatinine Clearance: 43.4 mL/min (by C-G formula based on SCr of 0.73 mg/dL). Liver Function Tests: Recent Labs  Lab 10/03/18 1303  AST 20    ALT 17  ALKPHOS 83  BILITOT 0.8  PROT 6.9  ALBUMIN 3.8   No results for input(s): LIPASE, AMYLASE in the last 168 hours. No results for input(s): AMMONIA in the last 168 hours. Coagulation Profile: No results for input(s): INR, PROTIME in the last 168 hours. Cardiac Enzymes: No results for input(s): CKTOTAL, CKMB, CKMBINDEX, TROPONINI in the last 168 hours. BNP (last 3 results) No results for input(s): PROBNP in the last 8760 hours. HbA1C: No results for input(s): HGBA1C in the last 72 hours. CBG: No results for input(s): GLUCAP in the last 168 hours. Lipid Profile: No results for input(s): CHOL, HDL, LDLCALC, TRIG, CHOLHDL, LDLDIRECT in the last 72 hours. Thyroid Function Tests: No results for input(s): TSH, T4TOTAL, FREET4, T3FREE, THYROIDAB in the last 72 hours. Anemia Panel: No results for input(s): VITAMINB12, FOLATE, FERRITIN, TIBC, IRON, RETICCTPCT in the last 72 hours. Sepsis Labs: Recent Labs  Lab 10/03/18 1409  LATICACIDVEN 0.93    Recent Results (from the past 240 hour(s))  Culture, blood (routine x 2)     Status: None (Preliminary result)   Collection Time: 10/03/18  1:58 PM  Result Value Ref Range Status   Specimen Description BLOOD LEFT FOREARM  Final   Special Requests   Final    BOTTLES DRAWN AEROBIC AND ANAEROBIC Blood Culture results may not be optimal due to an excessive volume of blood received in culture bottles   Culture   Final    NO GROWTH < 24 HOURS Performed at [facility], [place] [address] [place], [place] [address]    Report Status PENDING  Incomplete  Culture, blood (routine x 2)     Status: None (Preliminary result)   Collection Time: 10/03/18  2:08 PM  Result Value Ref Range Status   Specimen Description BLOOD LEFT ANTECUBITAL  Final   Special Requests   Final    BOTTLES DRAWN AEROBIC AND ANAEROBIC Blood Culture adequate volume   Culture   Final    NO GROWTH < 24 HOURS Performed at [facility], [place] [address],  [place], [place] [address]    Report Status PENDING  Incomplete         Radiology Studies: Ct Angio Head W Or Wo Contrast  Result Date: 10/03/2018 CLINICAL DATA:  Patient has a persistent headache following aneurysm embolization. Patient also has infection to her face along with sore throat. Looking for possible infection spreading to soft tissues of the neck. Status post endovascular treatment of wide neck PICA aneurysm with telescoping pipeline diverting stents. EXAM: CT ANGIOGRAPHY HEAD AND NECK TECHNIQUE: Multidetector CT imaging of the head and neck was performed using the standard protocol during bolus administration of intravenous contrast. Multiplanar CT image reconstructions and MIPs were obtained to evaluate the vascular anatomy. Carotid stenosis measurements (when applicable) are obtained utilizing NASCET criteria, using the distal internal carotid diameter as the denominator. CONTRAST:  124mL ISOVUE-370 IOPAMIDOL (ISOVUE-370) INJECTION 76% COMPARISON:  Cerebral angiogram with endovascular stent placement 09/05/2018. CT head 09/20/2012. CTA head neck 08/30/2018. FINDINGS: CT HEAD FINDINGS Brain: No evidence for acute infarction, hemorrhage,  mass lesion, or extra-axial fluid. Hydrocephalus, up lifted corpus callosum, hypoattenuating white matter and effaced cortical sulci all features of normal pressure hydrocephalus. The thrombosed hyperdense portion of the aneurysm is smaller, now 15 x 11 mm cross-section as seen on series 5, image 7 as compared with previous cross-section of 16 x 13 mm. There is less mass effect on the brainstem and fourth ventricle. Vascular: Reported separately. Skull: Previous suboccipital craniectomy to the RIGHT. Sinuses: Retention cysts. Orbits: Negative Review of the MIP images confirms the above findings CTA NECK FINDINGS Aortic arch: Standard branching. Imaged portion shows no evidence of aneurysm or dissection. No significant stenosis of the major arch vessel origins.  Aortic atherosclerosis. Right carotid system: No evidence of dissection, stenosis (50% or greater) or occlusion. Left carotid system: No evidence of dissection, stenosis (50% or greater) or occlusion. Vertebral arteries: Mildly dominant RIGHT vertebral. Nonstenotic plaque at the LEFT vertebral ostium. Skeleton: Spondylosis. Other neck: Asymmetric medialization RIGHT vocal cord is redemonstrated. Generalized prominence of BILATERAL level 2 lymph nodes, likely reactive. Partial LEFT thyroidectomy. Upper chest: Apical scarring. No mass or pneumothorax. Review of the MIP images confirms the above findings CTA HEAD FINDINGS Anterior circulation: No significant stenosis, proximal occlusion, aneurysm, or vascular malformation. Posterior circulation: Dominant RIGHT vertebral artery has been treated with pipeline devices and remains widely patent. Wide neck PICA aneurysm is redemonstrated, not enlarged, and no associated subarachnoid blood. Measurements of the residual aneurysmal filling are 6 x 6 x 6 mm. Venous sinuses: As permitted by contrast timing, patent. Anatomic variants: Hypoplastic RIGHT P1 segment. No branch occlusion. Delayed phase: The post infusion appearance of the thrombosed portion of the aneurysm yields a cross-section of 16 x 15. No abnormal intracranial enhancement. Review of the MIP images confirms the above findings IMPRESSION: 1. Satisfactory appearance status post RIGHT vertebral artery pipeline devices and PICA aneurysm coiling. 2. No extracranial or intracranial stenosis of significance. 3. Decreased size of the thrombosed aneurysm appearance on noncontrast scan, now 15 x 11 mm cross-section. The portion of the PICA aneurysm which fills is unchanged, 6 x 6 x 6 mm. 4. No acute intracranial findings are evident. Specifically no subarachnoid hemorrhage. 5. Other than reactive lymphadenopathy, no concerning findings in the neck of abscess or abnormal fluid collection. 6. Findings reviewed with Dr.  Estanislado Pandy, who concurs. Electronically Signed   By: Staci Righter M.D.   On: 10/03/2018 16:25   Dg Chest 2 View  Result Date: 10/03/2018 CLINICAL DATA:  Facial infection EXAM: CHEST - 2 VIEW COMPARISON:  09/20/2012 FINDINGS: Cardiac shadow is stable. Aortic calcifications are again seen. The lungs are well aerated bilaterally. Stable elevation of left hemidiaphragm is noted with blunting of left costophrenic angle similar to that seen on prior exam. Hiatal hernia is noted as well. No bony abnormality is seen aside from the known stable scoliosis. IMPRESSION: Chronic changes in the left lung base in part due to elevation of the left hemidiaphragm as well as hiatal hernia. No acute abnormality is noted. Electronically Signed   By: Inez Catalina M.D.   On: 10/03/2018 14:53   Ct Angio Neck W And/or Wo Contrast  Result Date: 10/03/2018 CLINICAL DATA:  Patient has a persistent headache following aneurysm embolization. Patient also has infection to her face along with sore throat. Looking for possible infection spreading to soft tissues of the neck. Status post endovascular treatment of wide neck PICA aneurysm with telescoping pipeline diverting stents. EXAM: CT ANGIOGRAPHY HEAD AND NECK TECHNIQUE: Multidetector CT imaging of the  head and neck was performed using the standard protocol during bolus administration of intravenous contrast. Multiplanar CT image reconstructions and MIPs were obtained to evaluate the vascular anatomy. Carotid stenosis measurements (when applicable) are obtained utilizing NASCET criteria, using the distal internal carotid diameter as the denominator. CONTRAST:  155mL ISOVUE-370 IOPAMIDOL (ISOVUE-370) INJECTION 76% COMPARISON:  Cerebral angiogram with endovascular stent placement 09/05/2018. CT head 09/20/2012. CTA head neck 08/30/2018. FINDINGS: CT HEAD FINDINGS Brain: No evidence for acute infarction, hemorrhage, mass lesion, or extra-axial fluid. Hydrocephalus, up lifted corpus callosum,  hypoattenuating white matter and effaced cortical sulci all features of normal pressure hydrocephalus. The thrombosed hyperdense portion of the aneurysm is smaller, now 15 x 11 mm cross-section as seen on series 5, image 7 as compared with previous cross-section of 16 x 13 mm. There is less mass effect on the brainstem and fourth ventricle. Vascular: Reported separately. Skull: Previous suboccipital craniectomy to the RIGHT. Sinuses: Retention cysts. Orbits: Negative Review of the MIP images confirms the above findings CTA NECK FINDINGS Aortic arch: Standard branching. Imaged portion shows no evidence of aneurysm or dissection. No significant stenosis of the major arch vessel origins. Aortic atherosclerosis. Right carotid system: No evidence of dissection, stenosis (50% or greater) or occlusion. Left carotid system: No evidence of dissection, stenosis (50% or greater) or occlusion. Vertebral arteries: Mildly dominant RIGHT vertebral. Nonstenotic plaque at the LEFT vertebral ostium. Skeleton: Spondylosis. Other neck: Asymmetric medialization RIGHT vocal cord is redemonstrated. Generalized prominence of BILATERAL level 2 lymph nodes, likely reactive. Partial LEFT thyroidectomy. Upper chest: Apical scarring. No mass or pneumothorax. Review of the MIP images confirms the above findings CTA HEAD FINDINGS Anterior circulation: No significant stenosis, proximal occlusion, aneurysm, or vascular malformation. Posterior circulation: Dominant RIGHT vertebral artery has been treated with pipeline devices and remains widely patent. Wide neck PICA aneurysm is redemonstrated, not enlarged, and no associated subarachnoid blood. Measurements of the residual aneurysmal filling are 6 x 6 x 6 mm. Venous sinuses: As permitted by contrast timing, patent. Anatomic variants: Hypoplastic RIGHT P1 segment. No branch occlusion. Delayed phase: The post infusion appearance of the thrombosed portion of the aneurysm yields a cross-section of 16 x  15. No abnormal intracranial enhancement. Review of the MIP images confirms the above findings IMPRESSION: 1. Satisfactory appearance status post RIGHT vertebral artery pipeline devices and PICA aneurysm coiling. 2. No extracranial or intracranial stenosis of significance. 3. Decreased size of the thrombosed aneurysm appearance on noncontrast scan, now 15 x 11 mm cross-section. The portion of the PICA aneurysm which fills is unchanged, 6 x 6 x 6 mm. 4. No acute intracranial findings are evident. Specifically no subarachnoid hemorrhage. 5. Other than reactive lymphadenopathy, no concerning findings in the neck of abscess or abnormal fluid collection. 6. Findings reviewed with Dr. Estanislado Pandy, who concurs. Electronically Signed   By: Staci Righter M.D.   On: 10/03/2018 16:25        Scheduled Meds: . aspirin EC  81 mg Oral Daily  . clopidogrel  75 mg Oral QPM  . DULoxetine  120 mg Oral QHS  . enoxaparin (LOVENOX) injection  40 mg Subcutaneous Q24H  . QUEtiapine  25 mg Oral QHS  . sodium chloride flush  3 mL Intravenous Q12H   Continuous Infusions: . sodium chloride    . acyclovir    . piperacillin-tazobactam (ZOSYN)  IV 3.375 g (10/04/18 1243)  . vancomycin       LOS: 1 day    Time spent: 35 mins.More than 50% of  that time was spent in counseling and/or coordination of care.      Shelly Coss, MD [facility] Pager [phone]  If 7PM-7AM, please contact night-coverage [email or web address] Password [email or web address] 10/04/2018, 2:06 PM

## 2018-10-04 NOTE — Consult Note (Signed)
OPHTHALMOLOGY CONSULT NOTE   HPI:   73 yo F w/ PMH of brain aneurysm s/p clipping 1998, and now s/p IR procedure of Posterior inferior cerebral artery aneurysm on 09/05/18. Admitted for dermatomal facial lesion, R V2 dermatome -- herpes zoster vs facial cellulitis. Pt reports rash started as a single boil ~2.5 wks wks ago. Progressively grew and spread. Presented to [facility] clinic on Friday, 09/30/18. Dr. Sabra Heck prescribed antibiotic cream and oral abx. Rash continued to progress despite treatment. Pt then presented to [facility] ED on 1/6 due to worsening and crusting of rash.Pt denies any significant pain from lesion. Ophthalmology consulted due to concern for herpes zoster -- requesting to rule out ocular involvement. Pt denies eye pain, vision changes, diplopia. Negative Hutchinson's sign.  OHx: Strabismus (esotropia w/ diplopia) s/p LLR resection + LMR recession OS, 11/06/16 -- Dr. Jalene Mullet at [facility]: none   No current facility-administered medications on file prior to encounter.    Current Outpatient Medications on File Prior to Encounter  Medication Sig Dispense Refill  . ALPRAZolam (XANAX) 0.5 MG tablet Take 0.5 mg by mouth daily as needed for anxiety.    Marland Kitchen amoxicillin-clavulanate (AUGMENTIN) 875-125 MG tablet Take 1 tablet by mouth 2 (two) times daily.    Marland Kitchen aspirin EC 81 MG tablet Take 81 mg by mouth daily.     . Calcium Carbonate-Vitamin D (CALCIUM 600+D PO) Take 1 tablet by mouth at bedtime.     . cholecalciferol (VITAMIN D3) 25 MCG (1000 UT) tablet Take 1,000 Units by mouth at bedtime.     . clopidogrel (PLAVIX) 75 MG tablet Take 75 mg by mouth every evening.    . DULoxetine (CYMBALTA) 60 MG capsule Take 120 mg by mouth at bedtime.    . naproxen sodium (ALEVE) 220 MG tablet Take 440 mg by mouth 2 (two) times daily as needed (pain).    . QUEtiapine (SEROQUEL) 25 MG tablet Take 25 mg by mouth at bedtime.    . triamcinolone cream (KENALOG) 0.1 % Apply 1 application topically 3 (three)  times daily. Apply to the rash on face      Past Medical History:  Diagnosis Date  . Anemia    years ago  . Arthritis   . Basal cell carcinoma of eye    biopsy of left eye/ non cancerous  . Complication of anesthesia    nausea/vomiting  . Depression   . GERD (gastroesophageal reflux disease)   . History of kidney stones   . Hypertension    no longer on medications    family history is not on file.  Social History   Occupational History  . Not on file  Tobacco Use  . Smoking status: Never Smoker  . Smokeless tobacco: Never Used  Substance and Sexual Activity  . Alcohol use: No  . Drug use: Never  . Sexual activity: Not on file    Allergies  Allergen Reactions  . Sulfa Antibiotics Other (See Comments)    Reaction:  Unknown     EXAM  Mental Status: A&Ox3   Base Exam  OD  OS   [facility] [place] (near card)  20/20 20/30-  Pupils  Round; reactive; 4-76mm; no rAPD Round; reactive; 4-56mm; no rAPD  IOP  12 mmHg 13 mmHg  Motility  Full Full  External  Crusted lesions within V2 with 3+ erythema Normal    Anterior Exam  OD  OS   Lids / Lashes  mild crusting and erythema of lower lid;  upper lid normal Normal  Conj / Sclera Normal; no injectoin Normal  Cornea  Clear; no epi defect or dendrites on fl staining Clear  Ant Chamber  Deep Deep  Iris Normal Normal  Lens 2+ NS; 2+ CC 2+ NS; 2+ CC    Posterior Exam  OD  OS   Vitreous   Clear  Clear  Disc  Pink and sharp; compact; c/d 0.1  Pink and sharp; compact; c/d 0.1   Macula  Flat; blunted foveal reflex Flat; blunted foveal reflex  Vessels  Mild attenuation Mild attenuation  Periphery  Attached; no RT/RD Attached; no RT/RD     Assessment/Plan:  1. Herpes zoster without ocular involvement - V2 rash with crusting vesicles - in comparison to images obtained in ED -- appears to be improving on IV antivirals - dilated eye exam completely normal -- no ocular involvement whatsover and pt completely asymptomatic in the right eye -  continue systemic and skin therapies per primary team - case may benefit from dermatology and or ID input - mild right lower lid involvement -- okay to use polysporin ophthalmic ointment to lower lid lesion to prevent bacterial superinfection   Gardiner Sleeper, M.D., Ph.D. Diseases & Surgery of the Retina and Vitreous [facility]

## 2018-10-04 NOTE — Progress Notes (Signed)
[facility] Infusion Coordinator will follow pt with ID Team to support Home Infusion Pharmacy services if needed.   If patient discharges after hours, please call [phone].   Larry Sierras 10/04/2018, 3:23 PM

## 2018-10-04 NOTE — Care Management Obs Status (Signed)
[place] NOTIFICATION   Patient Details  Name: JOSIANE LABINE MRN: [id number] Date of Birth: September 23, 1946   Medicare Observation Status Notification Given:  Yes    Marilu Favre, RN 10/04/2018, 3:51 PM

## 2018-10-04 NOTE — Consult Note (Signed)
[facility]    Date of Admission:  10/03/2018     Total days of antibiotics 1               Reason for Consult: Herpes Zoster / Cellulitis    Referring Provider: Tawanna Solo  Primary Care Provider: Carol Ada, MD   Assessment/Plan:  [name] is a 73 y/o female presenting with 2 weeks of facial rash that appears to be either a resolving herpes zoster infection or possible cellulitis. No new lesions have appeared and lesions are crusted over. Evaluated by ophthalmology with no optic involvement and no evidence of Ramsey Hunt syndrome. Recommend discontinuation of IV therapy and transition to oral valacyclovir and topical mupirocin cream.   1. Discontinue IV acyclovir.  2. Start mupirocin cream and valacyclovir (1 g tid) for 3 days. 3. Follow up with primary care as needed or for worsening. 4. Ok to discharge from ID standpoint.    Active Problems:   Facial cellulitis   . aspirin EC  81 mg Oral Daily  . bacitracin-polymyxin b   Right Eye BID  . clopidogrel  75 mg Oral QPM  . DULoxetine  120 mg Oral QHS  . enoxaparin (LOVENOX) injection  40 mg Subcutaneous Q24H  . QUEtiapine  25 mg Oral QHS  . sodium chloride flush  3 mL Intravenous Q12H     HPI: Elaine Rivera is a 73 y.o. female with previous medical history as detailed below presenting to the ED with facial swelling going on for about 2 weeks and non-responsive to oral antimicrobial therapy with Augmentin or triamcinolone prescribed by her primary care office.  She describes the initial lesion located on the right cheek that was fluid filled with no drainage, pain, burning or itching. Additional lesions began to form with worsening redness which brought her to the hospital. Started on broad spectrum antibiotics with vancomycin and piperacillin-tazobactam for facial cellulitis as well as IV acyclovir with concern for Herpes Zoster ophthalmicus. Evaluated by Ophthalmology and found to have no  ocular involvement.    Elaine Rivera has been afebrile since admission with no leukocytosis.Blood cultures have been without growth to date. CT scan was performed as she was having a headache following recent embolization of aneurysm about 1 month ago and with no acute intracranial findings. She is on Day 2 of therapy with IV acyclovir, vancomycin and piperacillin-tazobactam. Denies fevers, chills, sweats, changes in vision, burning, itching or changes in hearing.    Review of Systems: Review of Systems  Constitutional: Negative for chills and fever.  HENT: Negative for ear discharge, ear pain, hearing loss and tinnitus.   Eyes: Negative for blurred vision, photophobia, pain, discharge and redness.  Respiratory: Negative for cough, shortness of breath and wheezing.   Gastrointestinal: Negative for constipation, diarrhea, nausea and vomiting.  Skin: Positive for rash.  Neurological: Positive for headaches. Negative for dizziness, tingling, sensory change, speech change, loss of consciousness and weakness.     Past Medical History:  Diagnosis Date  . Anemia    years ago  . Arthritis   . Basal cell carcinoma of eye    biopsy of left eye/ non cancerous  . Complication of anesthesia    nausea/vomiting  . Depression   . GERD (gastroesophageal reflux disease)   . History of kidney stones   . Hypertension    no longer on medications    Social History   Tobacco Use  . Smoking status: Never Smoker  .  Smokeless tobacco: Never Used  Substance Use Topics  . Alcohol use: No  . Drug use: Never    No family history on file.  Allergies  Allergen Reactions  . Sulfa Antibiotics Other (See Comments)    Reaction:  Unknown     OBJECTIVE: Blood pressure (!) 116/51, pulse (!) 115, temperature 98.6 F (37 C), temperature source Oral, resp. rate 20, weight 49.9 kg, SpO2 97 %.  Physical Exam Constitutional:      General: She is not in acute distress.    Appearance: She is well-developed.       Comments: Pleasant; lying in bed with head of bed elevated.   HENT:     Head:   Cardiovascular:     Rate and Rhythm: Normal rate and regular rhythm.     Heart sounds: Normal heart sounds.  Pulmonary:     Effort: Pulmonary effort is normal.     Breath sounds: Normal breath sounds.  Skin:    General: Skin is warm and dry.  Neurological:     Mental Status: She is alert and oriented to person, place, and time.  Psychiatric:        Behavior: Behavior normal.        Thought Content: Thought content normal.        Judgment: Judgment normal.     Lab Results Lab Results  Component Value Date   WBC 4.9 10/04/2018   HGB 11.3 (L) 10/04/2018   HCT 35.3 (L) 10/04/2018   MCV 91.7 10/04/2018   PLT 240 10/04/2018    Lab Results  Component Value Date   CREATININE 0.73 10/04/2018   BUN 11 10/04/2018   NA 137 10/04/2018   K 3.6 10/04/2018   CL 104 10/04/2018   CO2 24 10/04/2018    Lab Results  Component Value Date   ALT 17 10/03/2018   AST 20 10/03/2018   ALKPHOS 83 10/03/2018   BILITOT 0.8 10/03/2018     Microbiology: Recent Results (from the past 240 hour(s))  Culture, blood (routine x 2)     Status: None (Preliminary result)   Collection Time: 10/03/18  1:58 PM  Result Value Ref Range Status   Specimen Description BLOOD LEFT FOREARM  Final   Special Requests   Final    BOTTLES DRAWN AEROBIC AND ANAEROBIC Blood Culture results may not be optimal due to an excessive volume of blood received in culture bottles   Culture   Final    NO GROWTH < 24 HOURS Performed at [facility], [place] [address] [place], [place] [address]    Report Status PENDING  Incomplete  Culture, blood (routine x 2)     Status: None (Preliminary result)   Collection Time: 10/03/18  2:08 PM  Result Value Ref Range Status   Specimen Description BLOOD LEFT ANTECUBITAL  Final   Special Requests   Final    BOTTLES DRAWN AEROBIC AND ANAEROBIC Blood Culture adequate volume   Culture   Final    NO  GROWTH < 24 HOURS Performed at [facility], [place] [address] [place], [place] [address]    Report Status PENDING  Incomplete     Terri Piedra, [place] for [place] Pager  10/04/2018  3:21 PM

## 2018-10-04 NOTE — Discharge Summary (Signed)
Physician Discharge Summary  ANHAR MCDERMOTT ZOX:[id number] DOB: 1946/07/16 DOA: 10/03/2018  PCP: Carol Ada, MD  Admit date: 10/03/2018 Discharge date: 10/04/2018  Admitted From: Home Disposition:  Home  Discharge Condition:Stable CODE STATUS:FULL Diet recommendation: Heart Healthy  Brief/Interim Summary: Patient is a 73 year old female with past medical history of left eye basal cell carcinoma, cerebral AVM, hypertension not on medication who presented to the emergency department with 1 week history of right facial vesicular lesions that started progressing.  It also involves her lower eyelid.  Started as a single vesicular lesion on the right cheekbone which gradually progressed.  Found to have herpes zoster with facial involvement.  She was  started on acyclovir and other antibiotics. Ophthalmology/ID consulted. Patient was seen by ophthalmology and ID.  ID recommended to discontinue IV antibiotics and discharge her home with oral antibiotics. She is stable for discharge home today.  Following problems were addressed during her hospitalization:   Herpes Zooster/cellulitis of the right face: Involving V2 dermatome.  Started about a week ago.  Failed antibiotic treatment as an outpatient. She has history of vaginal herpes. She was Started on IV acyclovir and other broad-spectrum antibiotics. Lesions are improving. Ophthalmology consulted.  No involvement of her eye.  Mild involvement of the right lower lid.  Ophthalmology recommended to use Polysporin ophthalmic ointment to the lower lid lesion Infectious disease commended to discharge her home with 3 days course of Valtrex and mupirocin.  History of headache/history of brain aneurysms: Stable.  Underwent IR procedure for posterior inferior cerebral artery rhythm on 09/05/2018.  History of basal cell carcinoma of the left eye: Currently stable.S/P surgery   Discharge Diagnoses:  Active Problems:   Facial  cellulitis    Discharge Instructions  Discharge Instructions    Diet - low sodium heart healthy   Complete by:  As directed    Discharge instructions   Complete by:  As directed    1)Follow up with your PCP in a week. 2)Take prescribed medications as instructed.   Increase activity slowly   Complete by:  As directed      Allergies as of 10/04/2018      Reactions   Sulfa Antibiotics Other (See Comments)   Reaction:  Unknown       Medication List    STOP taking these medications   amoxicillin-clavulanate 875-125 MG tablet Commonly known as:  AUGMENTIN     TAKE these medications   ALPRAZolam 0.5 MG tablet Commonly known as:  XANAX Take 0.5 mg by mouth daily as needed for anxiety.   aspirin EC 81 MG tablet Take 81 mg by mouth daily.   bacitracin-polymyxin b ophthalmic ointment Commonly known as:  POLYSPORIN Place into the right eye 2 (two) times daily for 7 days. apply to eye every 12 hours while awake   CALCIUM 600+D PO Take 1 tablet by mouth at bedtime.   cholecalciferol 25 MCG (1000 UT) tablet Commonly known as:  VITAMIN D3 Take 1,000 Units by mouth at bedtime.   clopidogrel 75 MG tablet Commonly known as:  PLAVIX Take 75 mg by mouth every evening.   DULoxetine 60 MG capsule Commonly known as:  CYMBALTA Take 120 mg by mouth at bedtime.   mupirocin ointment 2 % Commonly known as:  BACTROBAN Apply to affected area 3 times daily for 5-7 days   naproxen sodium 220 MG tablet Commonly known as:  ALEVE Take 440 mg by mouth 2 (two) times daily as needed (pain).   QUEtiapine 25 MG  tablet Commonly known as:  SEROQUEL Take 25 mg by mouth at bedtime.   triamcinolone cream 0.1 % Commonly known as:  KENALOG Apply 1 application topically 3 (three) times daily. Apply to the rash on face   valACYclovir 1000 MG tablet Commonly known as:  VALTREX Take 1 tablet (1,000 mg total) by mouth 3 (three) times daily for 3 days.      Follow-up Information    Carol Ada, MD. Schedule an appointment as soon as possible for a visit in 1 week(s).   Specialty:  Family Medicine Contact information: [place] [id number] [phone]          Allergies  Allergen Reactions  . Sulfa Antibiotics Other (See Comments)    Reaction:  Unknown     Consultations:  ID, ophthalmology   Procedures/Studies: Ct Angio Head W Or Wo Contrast  Result Date: 10/03/2018 CLINICAL DATA:  Patient has a persistent headache following aneurysm embolization. Patient also has infection to her face along with sore throat. Looking for possible infection spreading to soft tissues of the neck. Status post endovascular treatment of wide neck PICA aneurysm with telescoping pipeline diverting stents. EXAM: CT ANGIOGRAPHY HEAD AND NECK TECHNIQUE: Multidetector CT imaging of the head and neck was performed using the standard protocol during bolus administration of intravenous contrast. Multiplanar CT image reconstructions and MIPs were obtained to evaluate the vascular anatomy. Carotid stenosis measurements (when applicable) are obtained utilizing NASCET criteria, using the distal internal carotid diameter as the denominator. CONTRAST:  190m ISOVUE-370 IOPAMIDOL (ISOVUE-370) INJECTION 76% COMPARISON:  Cerebral angiogram with endovascular stent placement 09/05/2018. CT head 09/20/2012. CTA head neck 08/30/2018. FINDINGS: CT HEAD FINDINGS Brain: No evidence for acute infarction, hemorrhage, mass lesion, or extra-axial fluid. Hydrocephalus, up lifted corpus callosum, hypoattenuating white matter and effaced cortical sulci all features of normal pressure hydrocephalus. The thrombosed hyperdense portion of the aneurysm is smaller, now 15 x 11 mm cross-section as seen on series 5, image 7 as compared with previous cross-section of 16 x 13 mm. There is less mass effect on the brainstem and fourth ventricle. Vascular: Reported separately. Skull: Previous suboccipital  craniectomy to the RIGHT. Sinuses: Retention cysts. Orbits: Negative Review of the MIP images confirms the above findings CTA NECK FINDINGS Aortic arch: Standard branching. Imaged portion shows no evidence of aneurysm or dissection. No significant stenosis of the major arch vessel origins. Aortic atherosclerosis. Right carotid system: No evidence of dissection, stenosis (50% or greater) or occlusion. Left carotid system: No evidence of dissection, stenosis (50% or greater) or occlusion. Vertebral arteries: Mildly dominant RIGHT vertebral. Nonstenotic plaque at the LEFT vertebral ostium. Skeleton: Spondylosis. Other neck: Asymmetric medialization RIGHT vocal cord is redemonstrated. Generalized prominence of BILATERAL level 2 lymph nodes, likely reactive. Partial LEFT thyroidectomy. Upper chest: Apical scarring. No mass or pneumothorax. Review of the MIP images confirms the above findings CTA HEAD FINDINGS Anterior circulation: No significant stenosis, proximal occlusion, aneurysm, or vascular malformation. Posterior circulation: Dominant RIGHT vertebral artery has been treated with pipeline devices and remains widely patent. Wide neck PICA aneurysm is redemonstrated, not enlarged, and no associated subarachnoid blood. Measurements of the residual aneurysmal filling are 6 x 6 x 6 mm. Venous sinuses: As permitted by contrast timing, patent. Anatomic variants: Hypoplastic RIGHT P1 segment. No branch occlusion. Delayed phase: The post infusion appearance of the thrombosed portion of the aneurysm yields a cross-section of 16 x 15. No abnormal intracranial enhancement. Review of the MIP images confirms the  above findings IMPRESSION: 1. Satisfactory appearance status post RIGHT vertebral artery pipeline devices and PICA aneurysm coiling. 2. No extracranial or intracranial stenosis of significance. 3. Decreased size of the thrombosed aneurysm appearance on noncontrast scan, now 15 x 11 mm cross-section. The portion of the  PICA aneurysm which fills is unchanged, 6 x 6 x 6 mm. 4. No acute intracranial findings are evident. Specifically no subarachnoid hemorrhage. 5. Other than reactive lymphadenopathy, no concerning findings in the neck of abscess or abnormal fluid collection. 6. Findings reviewed with Dr. Estanislado Pandy, who concurs. Electronically Signed   By: Staci Righter M.D.   On: 10/03/2018 16:25   Dg Chest 2 View  Result Date: 10/03/2018 CLINICAL DATA:  Facial infection EXAM: CHEST - 2 VIEW COMPARISON:  09/20/2012 FINDINGS: Cardiac shadow is stable. Aortic calcifications are again seen. The lungs are well aerated bilaterally. Stable elevation of left hemidiaphragm is noted with blunting of left costophrenic angle similar to that seen on prior exam. Hiatal hernia is noted as well. No bony abnormality is seen aside from the known stable scoliosis. IMPRESSION: Chronic changes in the left lung base in part due to elevation of the left hemidiaphragm as well as hiatal hernia. No acute abnormality is noted. Electronically Signed   By: Inez Catalina M.D.   On: 10/03/2018 14:53   Ct Angio Neck W And/or Wo Contrast  Result Date: 10/03/2018 CLINICAL DATA:  Patient has a persistent headache following aneurysm embolization. Patient also has infection to her face along with sore throat. Looking for possible infection spreading to soft tissues of the neck. Status post endovascular treatment of wide neck PICA aneurysm with telescoping pipeline diverting stents. EXAM: CT ANGIOGRAPHY HEAD AND NECK TECHNIQUE: Multidetector CT imaging of the head and neck was performed using the standard protocol during bolus administration of intravenous contrast. Multiplanar CT image reconstructions and MIPs were obtained to evaluate the vascular anatomy. Carotid stenosis measurements (when applicable) are obtained utilizing NASCET criteria, using the distal internal carotid diameter as the denominator. CONTRAST:  132m ISOVUE-370 IOPAMIDOL (ISOVUE-370)  INJECTION 76% COMPARISON:  Cerebral angiogram with endovascular stent placement 09/05/2018. CT head 09/20/2012. CTA head neck 08/30/2018. FINDINGS: CT HEAD FINDINGS Brain: No evidence for acute infarction, hemorrhage, mass lesion, or extra-axial fluid. Hydrocephalus, up lifted corpus callosum, hypoattenuating white matter and effaced cortical sulci all features of normal pressure hydrocephalus. The thrombosed hyperdense portion of the aneurysm is smaller, now 15 x 11 mm cross-section as seen on series 5, image 7 as compared with previous cross-section of 16 x 13 mm. There is less mass effect on the brainstem and fourth ventricle. Vascular: Reported separately. Skull: Previous suboccipital craniectomy to the RIGHT. Sinuses: Retention cysts. Orbits: Negative Review of the MIP images confirms the above findings CTA NECK FINDINGS Aortic arch: Standard branching. Imaged portion shows no evidence of aneurysm or dissection. No significant stenosis of the major arch vessel origins. Aortic atherosclerosis. Right carotid system: No evidence of dissection, stenosis (50% or greater) or occlusion. Left carotid system: No evidence of dissection, stenosis (50% or greater) or occlusion. Vertebral arteries: Mildly dominant RIGHT vertebral. Nonstenotic plaque at the LEFT vertebral ostium. Skeleton: Spondylosis. Other neck: Asymmetric medialization RIGHT vocal cord is redemonstrated. Generalized prominence of BILATERAL level 2 lymph nodes, likely reactive. Partial LEFT thyroidectomy. Upper chest: Apical scarring. No mass or pneumothorax. Review of the MIP images confirms the above findings CTA HEAD FINDINGS Anterior circulation: No significant stenosis, proximal occlusion, aneurysm, or vascular malformation. Posterior circulation: Dominant RIGHT vertebral artery has been  treated with pipeline devices and remains widely patent. Wide neck PICA aneurysm is redemonstrated, not enlarged, and no associated subarachnoid blood. Measurements  of the residual aneurysmal filling are 6 x 6 x 6 mm. Venous sinuses: As permitted by contrast timing, patent. Anatomic variants: Hypoplastic RIGHT P1 segment. No branch occlusion. Delayed phase: The post infusion appearance of the thrombosed portion of the aneurysm yields a cross-section of 16 x 15. No abnormal intracranial enhancement. Review of the MIP images confirms the above findings IMPRESSION: 1. Satisfactory appearance status post RIGHT vertebral artery pipeline devices and PICA aneurysm coiling. 2. No extracranial or intracranial stenosis of significance. 3. Decreased size of the thrombosed aneurysm appearance on noncontrast scan, now 15 x 11 mm cross-section. The portion of the PICA aneurysm which fills is unchanged, 6 x 6 x 6 mm. 4. No acute intracranial findings are evident. Specifically no subarachnoid hemorrhage. 5. Other than reactive lymphadenopathy, no concerning findings in the neck of abscess or abnormal fluid collection. 6. Findings reviewed with Dr. Estanislado Pandy, who concurs. Electronically Signed   By: Staci Righter M.D.   On: 10/03/2018 16:25   Ir Transcath/emboliz  Result Date: 09/07/2018 CLINICAL DATA:  Worsening headaches. 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 Past history of right occipital craniotomy. EXAM: ARTERIOGRAPHY; WORKSTATION 3D RECONSTRUCTION; TRANSCATHETER THERAPY EMBOLIZATION; IR ANGIO VERTEBRAL SEL VERTEBRAL UNI RIGHT MOD SED; BILATERAL COMMON CAROTID AND INNOMINATE ANGIOGRAPHY COMPARISON:  CT angiogram of the head and neck of 08/30/2018. MEDICATIONS: Heparin 4,000 units IV; Ancef 2 g IV was administered within 1 hour of the procedure. ANESTHESIA/SEDATION: Mac anesthesia for the diagnostic portion of the examination, and general anesthesia for the embolization portion of the examination by the department of Anesthesiology at [facility]:  Isovue 300 approximately 110 mL. FLUOROSCOPY TIME:  Fluoroscopy  Time: 61 minutes 6 seconds (3786 mGy). COMPLICATIONS: None immediate. TECHNIQUE: Informed written consent was obtained from the patient after a thorough discussion of the procedural risks, benefits and alternatives. All questions were addressed. Maximal Sterile Barrier Technique was utilized including caps, mask, sterile gowns, sterile gloves, sterile drape, hand hygiene and skin antiseptic. A timeout was performed prior to the initiation of the procedure. The right groin was prepped and draped in the usual sterile fashion. Thereafter using modified Seldinger technique, transfemoral access into the right common femoral artery was obtained without difficulty. Over a 0.035 inch guidewire, a 5 French Pinnacle sheath was inserted. Through this, and also over 0.035 inch guidewire, a 5 [place] JB 1 catheter was advanced to the aortic arch region and selectively positioned in the right common carotid artery, the right vertebral artery, and the left common carotid artery. Also performed was a 3D rotational arteriogram of the right posterior circulation via injection to the right vertebral artery. Reformations were then created on a separate workstation. Distal to this the right vertebrobasilar junction demonstrates wide patency into the basilar artery. The basilar artery, the left posterior cerebral artery, the superior cerebellar arteries and the anterior-inferior cerebellar arteries opacify into the capillary and venous phases. FINDINGS: The right common carotid arteriogram demonstrates the right external carotid artery and its major branches to be widely patent. The right internal carotid artery at the bulb to the cranial skull base demonstrates wide patency. The petrous, cavernous and supraclinoid segments are widely patent. A right posterior communicating artery is seen opacifying the right posterior cerebral artery distribution. The right middle cerebral artery and the right anterior cerebral artery opacify into the  capillary  and venous phases. The dominant right vertebral artery origin is widely patent. The vessel is seen to opacify to the cranial skull base. There is mild caliber irregularity involving the distal right vertebral artery in the cervical segment. Distal to this the origin of the right posterior-inferior cerebellar artery is also patent. There is mild fusiform dilatation of the right vertebrobasilar junction adjacent to the origin of the right posterior-inferior cerebellar artery with a nubbin along the lateral superior wall. A similar less prominent indentation is noted more proximally. Arising from the proximal 1/3 of the right posterior-inferior cerebellar artery is a wide necked irregular approximately 6 mm x 5 mm aneurysm. Flash filling of the right posterior cerebral artery P1 is noted. Unopacified blood is also noted in the basilar artery from the contralateral vertebral artery. The left common carotid arteriogram demonstrates the left external carotid artery and its major branches to be widely patent. The left internal carotid artery at the bulb to the cranial skull base demonstrates wide patency. The petrous, cavernous and supraclinoid segments are widely patent. A small infundibulum is seen in the left posterior communicating artery region. The left middle cerebral artery and the left anterior cerebral artery opacify into the capillary and venous phases. 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 The angio findings were reviewed with patient and the patient's daughter. Brought to their attention was the irregular right posterior-inferior cerebellar artery aneurysm, and the beaking effect at the distal aspect of the right vertebrobasilar junction probably representing the superior margin of the partially thrombosed large to giant aneurysm. Options mentioned to them  were those of stent assisted coiling versus placement of a flow diversion within the left posterior inferior cerebellar artery, and/or across the origin of the left posterior-inferior cerebellar artery. The patient and daughter were agreeable to proceed with endovascular treatment. The patient was then put under general anesthesia by the Department of Anesthesiology. The diagnostic JB 1 catheter in the right subclavian artery distally was exchanged over a 0.035 inch 300 cm Rosen exchange guidewire for an 85 cm 8 [place] Neuron Max guide sheath. This was advanced with its distal tip distal to the origin of the right vertebral artery. This was then connected to continuous heparinized saline infusion. Over the [employer] guidewire, a 5 French 115 cm Catalyst guide catheter was then advanced and positioned distal to the tip of the Neuron Max sheath. The guidewire was removed. Good aspiration obtained from the hub of the 5 [place] Catalyst guide catheter. The combination was then retrieved more proximally and proximal to the origin of the right vertebral artery. Using biplane roadmap technique and constant fluoroscopic guidance, a 0.035 inch Roadrunner guidewire was then advanced through the Catalyst guide catheter. Access into the right vertebral artery was obtained. The wire was advanced to the distal right vertebral artery followed by the Catalyst guide catheter. Also at this time, the 8 [place] Neuron Max guide sheath was advanced to the proximal 1/3 of the right vertebral artery. The guidewire was removed. Good aspiration obtained from the hub of the Catalyst guide catheter. A gentle control arteriogram performed through the Catalyst guide catheter demonstrated no evidence of spasms, dissections or of intraluminal filling defects. Arteriogram was then performed in the biplane DSA mode of the basilar artery. Measurements were performed of the right vertebral artery proximal to the right PICA, and distal to the right  posterior-inferior  cerebellar artery and the distal right vertebrobasilar junction. Again noted was a nubbin arising from the lateral superior wall of the right vertebrobasilar junction distal to the origin of the right posterior-inferior cerebellar artery. 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 An 027 Phenom 150 cm microcatheter was then advanced over a 0.014 inch Softip Synchro micro guidewire to the distal end of the Catalyst guide catheter. With the micro guidewire leading with a J-tip configuration, access was obtained into the origin of the right posterior-inferior cerebellar artery. The Phenom microcatheter was then advanced to close proximity to the origin of the right posterior-inferior cerebellar artery. Using a torque device, the wire was then manipulated to advance passed the wide neck aneurysm of the right posterior-inferior cerebellar artery. Access distal to the neck of the aneurysm could not be achieved with the micro guidewire. Therefore, this was then abandoned. The Phenom microcatheter was then advanced to the distal basilar artery just proximal to the origin of the posterior cerebral arteries over a 0.014 inch Softip Synchro micro guidewire. The guidewire was removed. Good aspiration obtained from the hub of the Phenom 027 microcatheter. A gentle contrast injection demonstrated safe position of the tip of the microcatheter. This was then connected to continuous heparinized saline infusion. Following evaluation of the 3D images, and the diagnostic catheter arteriograms, it was decided to use a 4.5 mm x 35 mm pipeline flex flow diverter device across the neck of the right posterior-inferior cerebellar artery. This was then advanced in a coaxial manner and with constant heparinized saline infusion using biplane roadmap technique to the distal end of the tip of the Phenom microcatheter. The microcatheter was  then retrieved unsheathing the distal wire tip of the device including a few mm of the device itself. Unsheathing was continued until the device started opening in its distal aspect. This was then retrieved more proximally to the distal landing zone which was just proximal to the entry point of the contralateral left vertebral artery. Once there, the remainder of the device was then deployed by advancing the microcatheter using the fluffing technique with the microcatheter to promote apposition with the native vessel. The Phenom microcatheter was then advanced over the micro guidewire into the distal basilar artery. Using a torque device, the micro guidewire was then captured into the microcatheter and removed. The microcatheter was connected to continuous heparinized saline infusion. A control arteriogram performed through the 5 [place] Catalyst guide catheter in the distal right vertebral artery demonstrated excellent apposition proximally and distally with moderate slow flow through the right posterior-inferior cerebellar artery and filling of the aneurysm. Control arteriograms were then performed at 15 and 30 minutes post deployment of the initial device. These continued to demonstrate excellent apposition without evidence of intraluminal filling defects or of occlusions. However, there continued to be a modest jet like innominate along the inferior aspect of the aneurysm. This prompted the use of a second flow diverter device. It was decided to use a 4.5 mm x 20 mm pipeline flex flow diverter device. This was again advanced to the distal end of the Phenom 027 microcatheter using biplane roadmap technique and constant fluoroscopic guidance in a coaxial manner and with constant heparinized saline infusion. This in turn was then visualized by retrieving the microcatheter. Once the distal 3 mm of the device was opened, the combination was then retrieved such that the landing zone of this was going to be in the  proximal  horizontal segment just distal to the right posterior-inferior cerebellar artery. Once in that position,, again using the pushing technique with the micro guidewire to deploy and the fluffing technique to stabilize the device the combination was utilized to deploy the entire device. Once completely deployed, a control arteriogram performed through the Catalyst guide catheter in the distal right vertebral artery demonstrated excellent apposition proximally and distally. There was double coverage at the origin of the right posterior-inferior cerebellar artery. A control arteriogram performed through the Catalyst guide catheter at 10 and 20 minutes post deployment of the second device continued to demonstrate excellent apposition with now slower flow in the region of the neck of the aneurysm. However, patency of the right posterior-inferior cerebellar artery continued to be maintained. A final control arteriogram performed through the 5 [place] Catalyst guide catheter in the right vertebral artery demonstrated excellent apposition without evidence of intraluminal filling defects or of occlusions, in the posterior cerebral circulation. Throughout the procedure, the patient's blood pressure and neurological status remained stable. The patient's ACT was in the region of 297. This prompted the use of approximately 15 mg of protamine sulfate to partially reverse the effect of the heparin. Thereafter, the 5 [place] Catalyst guide catheter, and the 8 [place] Neuron Max sheath were then retrieved into the abdominal aorta and exchanged over a J-tip guidewire for a 6 [place] Pinnacle sheath. This was then removed with hemostasis achieved manually in the right groin at the puncture site over 30 minutes. The patient's general anesthesia was then reversed and the patient was then extubated without difficulty. Upon recovery the patient demonstrated no new neurological signs or symptoms. She did not complain of any nausea. She  did complain of mild headache. She was able to move all her fours equally proportional to the effort. Distal pulses remained palpable in the dorsalis pedis and posterior tibial regions bilaterally at the end of the procedure. She was then transferred to the neuro ICU to continue on low-dose IV heparin, close neurologic observations, and maintaining her adequate blood pressure. The patient did develop oozing from her right groin whilst being transferred from the gurney to her bed. Pressure was held for about 15-20 minutes with hemostasis. A pressure bandage was applied. Overnight the patient complained of nausea with mild headache. This responded to 12.5 mg of Phenergan rectal suppository. She was able to take in free liquids in small amounts. The following morning, the IV heparin was stopped, the patient was switched to aspirin 81 mg a day, and Plavix 75 mg a day. Again while being sat up the patient had small oozing through the right groin puncture site. Again pressure was held for about 10 minutes. The patient was to be laid flat for 2 hours. Also encouraged was for the patient to start eating and drinking. The patient's potassium was also 3.1. This was replaced with oral potassium. The patient and her daughter were advised that upon her discharge, the patient was to rest overnight lying flat. She could ambulate using a walker or a cane the following morning. She was advised to maintain adequate hydration by drinking water. She was strongly advised to continue taking her aspirin 81 mg a day, and Plavix. She was to avoid stooping, bending or lifting weights above 10 pounds for a couple of weeks. She was also advised not to drive for a couple of weeks. The daughter was informed that should there be a slight oozing in the right groin puncture site to hold maintained pressure for approximately  10-15 minutes and keep the patient flat. Should she develop new neurological signs or symptoms or worsening headaches, visual  problems, motor weakness, to call 911. The patient will be seen in follow-up in outpatient clinic in 2 weeks post discharge. 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 PLAN: Follow-up in the clinic in 2 weeks time. Electronically Signed   By: Luanne Bras M.D.   On: 09/06/2018 11:50   Ir Angiogram Follow Up Study  Result Date: 09/07/2018 CLINICAL DATA:  Worsening headaches. 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 Past history of right occipital craniotomy. EXAM: ARTERIOGRAPHY; WORKSTATION 3D RECONSTRUCTION; TRANSCATHETER THERAPY EMBOLIZATION; IR ANGIO VERTEBRAL SEL VERTEBRAL UNI RIGHT MOD SED; BILATERAL COMMON CAROTID AND INNOMINATE ANGIOGRAPHY COMPARISON:  CT angiogram of the head and neck of 08/30/2018. MEDICATIONS: Heparin 4,000 units IV; Ancef 2 g IV was administered within 1 hour of the procedure. ANESTHESIA/SEDATION: Mac anesthesia for the diagnostic portion of the examination, and general anesthesia for the embolization portion of the examination by the department of Anesthesiology at [place]:  Isovue 300 approximately 110 mL. FLUOROSCOPY TIME:  Fluoroscopy Time: 61 minutes 6 seconds (3786 mGy). COMPLICATIONS: None immediate. TECHNIQUE: Informed written consent was obtained from the patient after a thorough discussion of the procedural risks, benefits and alternatives. All questions were addressed. Maximal Sterile Barrier Technique was utilized including caps, mask, sterile gowns, sterile gloves, sterile drape, hand hygiene and skin antiseptic. A timeout was performed prior to the initiation of the procedure. The right groin was prepped and draped in the usual sterile fashion. Thereafter using modified Seldinger technique, transfemoral access into the right  common femoral artery was obtained without difficulty. Over a 0.035 inch guidewire, a 5 [place] Pinnacle sheath was inserted. Through this, and also over 0.035 inch guidewire, a 5 [place] JB 1 catheter was advanced to the aortic arch region and selectively positioned in the right common carotid artery, the right vertebral artery, and the left common carotid artery. Also performed was a 3D rotational arteriogram of the right posterior circulation via injection to the right vertebral artery. Reformations were then created on a separate workstation. Distal to this the right vertebrobasilar junction demonstrates wide patency into the basilar artery. The basilar artery, the left posterior cerebral artery, the superior cerebellar arteries and the anterior-inferior cerebellar arteries opacify into the capillary and venous phases. FINDINGS: The right common carotid arteriogram demonstrates the right external carotid artery and its major branches to be widely patent. The right internal carotid artery at the bulb to the cranial skull base demonstrates wide patency. The petrous, cavernous and supraclinoid segments are widely patent. A right posterior communicating artery is seen opacifying the right posterior cerebral artery distribution. The right middle cerebral artery and the right anterior cerebral artery opacify into the capillary and venous phases. The dominant right vertebral artery origin is widely patent. The vessel is seen to opacify to the cranial skull base. There is mild caliber irregularity involving the distal right vertebral artery in the cervical segment. Distal to this the origin of the right posterior-inferior cerebellar artery is also patent. There is mild fusiform dilatation of the right vertebrobasilar junction adjacent to the origin of the right posterior-inferior cerebellar artery with a nubbin along the lateral superior wall. A similar less prominent indentation is noted more proximally. Arising from the  proximal 1/3 of the right posterior-inferior cerebellar artery is a wide necked irregular approximately 6 mm x 5 mm aneurysm. Flash filling of the right posterior cerebral artery P1 is noted. Unopacified blood is also noted in the basilar artery from the contralateral vertebral artery. The left common carotid arteriogram demonstrates the left external carotid artery and its major branches to be widely patent. The left internal carotid artery at the bulb to the cranial skull base demonstrates wide patency. The petrous, cavernous and supraclinoid segments are widely patent. A small infundibulum is seen in the left posterior communicating artery region. The left middle cerebral artery and the left anterior cerebral artery opacify into the capillary and venous phases. 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 The angio findings were reviewed with patient and the patient's daughter. Brought to their attention was the irregular right posterior-inferior cerebellar artery aneurysm, and the beaking effect at the distal aspect of the right vertebrobasilar junction probably representing the superior margin of the partially thrombosed large to giant aneurysm. Options mentioned to them were those of stent assisted coiling versus placement of a flow diversion within the left posterior inferior cerebellar artery, and/or across the origin of the left posterior-inferior cerebellar artery. The patient and daughter were agreeable to proceed with endovascular treatment. The patient was then put under general anesthesia by the Department of Anesthesiology. The diagnostic JB 1 catheter in the right subclavian artery distally was exchanged over a 0.035 inch 300 cm Rosen exchange guidewire for an 85 cm 8 [place] Neuron Max guide sheath. This was advanced with its distal tip distal to the origin of  the right vertebral artery. This was then connected to continuous heparinized saline infusion. Over the [employer] guidewire, a 5 French 115 cm Catalyst guide catheter was then advanced and positioned distal to the tip of the Neuron Max sheath. The guidewire was removed. Good aspiration obtained from the hub of the 5 [place] Catalyst guide catheter. The combination was then retrieved more proximally and proximal to the origin of the right vertebral artery. Using biplane roadmap technique and constant fluoroscopic guidance, a 0.035 inch Roadrunner guidewire was then advanced through the Catalyst guide catheter. Access into the right vertebral artery was obtained. The wire was advanced to the distal right vertebral artery followed by the Catalyst guide catheter. Also at this time, the 8 [place] Neuron Max guide sheath was advanced to the proximal 1/3 of the right vertebral artery. The guidewire was removed. Good aspiration obtained from the hub of the Catalyst guide catheter. A gentle control arteriogram performed through the Catalyst guide catheter demonstrated no evidence of spasms, dissections or of intraluminal filling defects. Arteriogram was then performed in the biplane DSA mode of the basilar artery. Measurements were performed of the right vertebral artery proximal to the right PICA, and distal to the right posterior-inferior cerebellar artery and the distal right vertebrobasilar junction. Again noted was a nubbin arising from the lateral superior wall of the right vertebrobasilar junction distal to the origin of the right posterior-inferior cerebellar artery. 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 An 027 Phenom 150 cm microcatheter was then advanced over a 0.014 inch Softip Synchro micro guidewire to the distal end of the Catalyst guide catheter. With the micro guidewire leading with a J-tip configuration, access was  obtained into the  origin of the right posterior-inferior cerebellar artery. The Phenom microcatheter was then advanced to close proximity to the origin of the right posterior-inferior cerebellar artery. Using a torque device, the wire was then manipulated to advance passed the wide neck aneurysm of the right posterior-inferior cerebellar artery. Access distal to the neck of the aneurysm could not be achieved with the micro guidewire. Therefore, this was then abandoned. The Phenom microcatheter was then advanced to the distal basilar artery just proximal to the origin of the posterior cerebral arteries over a 0.014 inch Softip Synchro micro guidewire. The guidewire was removed. Good aspiration obtained from the hub of the Phenom 027 microcatheter. A gentle contrast injection demonstrated safe position of the tip of the microcatheter. This was then connected to continuous heparinized saline infusion. Following evaluation of the 3D images, and the diagnostic catheter arteriograms, it was decided to use a 4.5 mm x 35 mm pipeline flex flow diverter device across the neck of the right posterior-inferior cerebellar artery. This was then advanced in a coaxial manner and with constant heparinized saline infusion using biplane roadmap technique to the distal end of the tip of the Phenom microcatheter. The microcatheter was then retrieved unsheathing the distal wire tip of the device including a few mm of the device itself. Unsheathing was continued until the device started opening in its distal aspect. This was then retrieved more proximally to the distal landing zone which was just proximal to the entry point of the contralateral left vertebral artery. Once there, the remainder of the device was then deployed by advancing the microcatheter using the fluffing technique with the microcatheter to promote apposition with the native vessel. The Phenom microcatheter was then advanced over the micro guidewire into the distal  basilar artery. Using a torque device, the micro guidewire was then captured into the microcatheter and removed. The microcatheter was connected to continuous heparinized saline infusion. A control arteriogram performed through the 5 [place] Catalyst guide catheter in the distal right vertebral artery demonstrated excellent apposition proximally and distally with moderate slow flow through the right posterior-inferior cerebellar artery and filling of the aneurysm. Control arteriograms were then performed at 15 and 30 minutes post deployment of the initial device. These continued to demonstrate excellent apposition without evidence of intraluminal filling defects or of occlusions. However, there continued to be a modest jet like innominate along the inferior aspect of the aneurysm. This prompted the use of a second flow diverter device. It was decided to use a 4.5 mm x 20 mm pipeline flex flow diverter device. This was again advanced to the distal end of the Phenom 027 microcatheter using biplane roadmap technique and constant fluoroscopic guidance in a coaxial manner and with constant heparinized saline infusion. This in turn was then visualized by retrieving the microcatheter. Once the distal 3 mm of the device was opened, the combination was then retrieved such that the landing zone of this was going to be in the proximal horizontal segment just distal to the right posterior-inferior cerebellar artery. Once in that position,, again using the pushing technique with the micro guidewire to deploy and the fluffing technique to stabilize the device the combination was utilized to deploy the entire device. Once completely deployed, a control arteriogram performed through the Catalyst guide catheter in the distal right vertebral artery demonstrated excellent apposition proximally and distally. There was double coverage at the origin of the right posterior-inferior cerebellar artery. A control arteriogram performed through  the Catalyst guide catheter at 10 and 20  minutes post deployment of the second device continued to demonstrate excellent apposition with now slower flow in the region of the neck of the aneurysm. However, patency of the right posterior-inferior cerebellar artery continued to be maintained. A final control arteriogram performed through the 5 [place] Catalyst guide catheter in the right vertebral artery demonstrated excellent apposition without evidence of intraluminal filling defects or of occlusions, in the posterior cerebral circulation. Throughout the procedure, the patient's blood pressure and neurological status remained stable. The patient's ACT was in the region of 297. This prompted the use of approximately 15 mg of protamine sulfate to partially reverse the effect of the heparin. Thereafter, the 5 [place] Catalyst guide catheter, and the 8 [place] Neuron Max sheath were then retrieved into the abdominal aorta and exchanged over a J-tip guidewire for a 6 [place] Pinnacle sheath. This was then removed with hemostasis achieved manually in the right groin at the puncture site over 30 minutes. The patient's general anesthesia was then reversed and the patient was then extubated without difficulty. Upon recovery the patient demonstrated no new neurological signs or symptoms. She did not complain of any nausea. She did complain of mild headache. She was able to move all her fours equally proportional to the effort. Distal pulses remained palpable in the dorsalis pedis and posterior tibial regions bilaterally at the end of the procedure. She was then transferred to the neuro ICU to continue on low-dose IV heparin, close neurologic observations, and maintaining her adequate blood pressure. The patient did develop oozing from her right groin whilst being transferred from the gurney to her bed. Pressure was held for about 15-20 minutes with hemostasis. A pressure bandage was applied. Overnight the patient complained of  nausea with mild headache. This responded to 12.5 mg of Phenergan rectal suppository. She was able to take in free liquids in small amounts. The following morning, the IV heparin was stopped, the patient was switched to aspirin 81 mg a day, and Plavix 75 mg a day. Again while being sat up the patient had small oozing through the right groin puncture site. Again pressure was held for about 10 minutes. The patient was to be laid flat for 2 hours. Also encouraged was for the patient to start eating and drinking. The patient's potassium was also 3.1. This was replaced with oral potassium. The patient and her daughter were advised that upon her discharge, the patient was to rest overnight lying flat. She could ambulate using a walker or a cane the following morning. She was advised to maintain adequate hydration by drinking water. She was strongly advised to continue taking her aspirin 81 mg a day, and Plavix. She was to avoid stooping, bending or lifting weights above 10 pounds for a couple of weeks. She was also advised not to drive for a couple of weeks. The daughter was informed that should there be a slight oozing in the right groin puncture site to hold maintained pressure for approximately 10-15 minutes and keep the patient flat. Should she develop new neurological signs or symptoms or worsening headaches, visual problems, motor weakness, to call 911. The patient will be seen in follow-up in outpatient clinic in 2 weeks post discharge. 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 PLAN: Follow-up in the clinic in 2 weeks time. Electronically Signed   By: Corky Downs.D.  On: 09/06/2018 11:50   Ir 3d Primitivo Gauze Darreld Mclean  Result Date: 09/07/2018 CLINICAL DATA:  Worsening headaches. Discovery of a large to giant right cerebellopontine angle  partially thrombosed aneurysm. Past history of right occipital craniotomy. EXAM: ARTERIOGRAPHY; WORKSTATION 3D RECONSTRUCTION; TRANSCATHETER THERAPY EMBOLIZATION; IR ANGIO VERTEBRAL SEL VERTEBRAL UNI RIGHT MOD SED; BILATERAL COMMON CAROTID AND INNOMINATE ANGIOGRAPHY COMPARISON:  CT angiogram of the head and neck of 08/30/2018. MEDICATIONS: Heparin 4,000 units IV; Ancef 2 g IV was administered within 1 hour of the procedure. ANESTHESIA/SEDATION: Mac anesthesia for the diagnostic portion of the examination, and general anesthesia for the embolization portion of the examination by the department of Anesthesiology at [place]:  Isovue 300 approximately 110 mL. FLUOROSCOPY TIME:  Fluoroscopy Time: 61 minutes 6 seconds (3786 mGy). COMPLICATIONS: None immediate. TECHNIQUE: Informed written consent was obtained from the patient after a thorough discussion of the procedural risks, benefits and alternatives. All questions were addressed. Maximal Sterile Barrier Technique was utilized including caps, mask, sterile gowns, sterile gloves, sterile drape, hand hygiene and skin antiseptic. A timeout was performed prior to the initiation of the procedure. The right groin was prepped and draped in the usual sterile fashion. Thereafter using modified Seldinger technique, transfemoral access into the right common femoral artery was obtained without difficulty. Over a 0.035 inch guidewire, a 5 [place] Pinnacle sheath was inserted. Through this, and also over 0.035 inch guidewire, a 5 [place] JB 1 catheter was advanced to the aortic arch region and selectively positioned in the right common carotid artery, the right vertebral artery, and the left common carotid artery. Also performed was a 3D rotational arteriogram of the right posterior circulation via injection to the right vertebral artery. Reformations were then created on a separate workstation. Distal to this the right vertebrobasilar junction demonstrates wide  patency into the basilar artery. The basilar artery, the left posterior cerebral artery, the superior cerebellar arteries and the anterior-inferior cerebellar arteries opacify into the capillary and venous phases. FINDINGS: The right common carotid arteriogram demonstrates the right external carotid artery and its major branches to be widely patent. The right internal carotid artery at the bulb to the cranial skull base demonstrates wide patency. The petrous, cavernous and supraclinoid segments are widely patent. A right posterior communicating artery is seen opacifying the right posterior cerebral artery distribution. The right middle cerebral artery and the right anterior cerebral artery opacify into the capillary and venous phases. The dominant right vertebral artery origin is widely patent. The vessel is seen to opacify to the cranial skull base. There is mild caliber irregularity involving the distal right vertebral artery in the cervical segment. Distal to this the origin of the right posterior-inferior cerebellar artery is also patent. There is mild fusiform dilatation of the right vertebrobasilar junction adjacent to the origin of the right posterior-inferior cerebellar artery with a nubbin along the lateral superior wall. A similar less prominent indentation is noted more proximally. Arising from the proximal 1/3 of the right posterior-inferior cerebellar artery is a wide necked irregular approximately 6 mm x 5 mm aneurysm. Flash filling of the right posterior cerebral artery P1 is noted. Unopacified blood is also noted in the basilar artery from the contralateral vertebral artery. The left common carotid arteriogram demonstrates the left external carotid artery and its major branches to be widely patent. The left internal carotid artery at the bulb to the cranial skull base demonstrates wide patency. The petrous, cavernous and supraclinoid segments are widely patent. A small infundibulum is seen  in the  left posterior communicating artery region. The left middle cerebral artery and the left anterior cerebral artery opacify into the capillary and venous phases. 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 The angio findings were reviewed with patient and the patient's daughter. Brought to their attention was the irregular right posterior-inferior cerebellar artery aneurysm, and the beaking effect at the distal aspect of the right vertebrobasilar junction probably representing the superior margin of the partially thrombosed large to giant aneurysm. Options mentioned to them were those of stent assisted coiling versus placement of a flow diversion within the left posterior inferior cerebellar artery, and/or across the origin of the left posterior-inferior cerebellar artery. The patient and daughter were agreeable to proceed with endovascular treatment. The patient was then put under general anesthesia by the Department of Anesthesiology. The diagnostic JB 1 catheter in the right subclavian artery distally was exchanged over a 0.035 inch 300 cm Rosen exchange guidewire for an 85 cm 8 [place] Neuron Max guide sheath. This was advanced with its distal tip distal to the origin of the right vertebral artery. This was then connected to continuous heparinized saline infusion. Over the [employer] guidewire, a 5 French 115 cm Catalyst guide catheter was then advanced and positioned distal to the tip of the Neuron Max sheath. The guidewire was removed. Good aspiration obtained from the hub of the 5 [place] Catalyst guide catheter. The combination was then retrieved more proximally and proximal to the origin of the right vertebral artery. Using biplane roadmap technique and constant fluoroscopic guidance, a 0.035 inch Roadrunner guidewire was then advanced through the Catalyst guide  catheter. Access into the right vertebral artery was obtained. The wire was advanced to the distal right vertebral artery followed by the Catalyst guide catheter. Also at this time, the 8 [place] Neuron Max guide sheath was advanced to the proximal 1/3 of the right vertebral artery. The guidewire was removed. Good aspiration obtained from the hub of the Catalyst guide catheter. A gentle control arteriogram performed through the Catalyst guide catheter demonstrated no evidence of spasms, dissections or of intraluminal filling defects. Arteriogram was then performed in the biplane DSA mode of the basilar artery. Measurements were performed of the right vertebral artery proximal to the right PICA, and distal to the right posterior-inferior cerebellar artery and the distal right vertebrobasilar junction. Again noted was a nubbin arising from the lateral superior wall of the right vertebrobasilar junction distal to the origin of the right posterior-inferior cerebellar artery. 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 An 027 Phenom 150 cm microcatheter was then advanced over a 0.014 inch Softip Synchro micro guidewire to the distal end of the Catalyst guide catheter. With the micro guidewire leading with a J-tip configuration, access was obtained into the origin of the right posterior-inferior cerebellar artery. The Phenom microcatheter was then advanced to close proximity to the origin of the right posterior-inferior cerebellar artery. Using a torque device, the wire was then manipulated to advance passed the wide neck aneurysm of the right posterior-inferior cerebellar artery. Access distal to the neck of the aneurysm could not be achieved with the micro guidewire. Therefore, this was then abandoned. The Phenom microcatheter was then advanced to the distal basilar artery just proximal to the origin of the posterior cerebral arteries  over a 0.014 inch  Softip Synchro micro guidewire. The guidewire was removed. Good aspiration obtained from the hub of the Phenom 027 microcatheter. A gentle contrast injection demonstrated safe position of the tip of the microcatheter. This was then connected to continuous heparinized saline infusion. Following evaluation of the 3D images, and the diagnostic catheter arteriograms, it was decided to use a 4.5 mm x 35 mm pipeline flex flow diverter device across the neck of the right posterior-inferior cerebellar artery. This was then advanced in a coaxial manner and with constant heparinized saline infusion using biplane roadmap technique to the distal end of the tip of the Phenom microcatheter. The microcatheter was then retrieved unsheathing the distal wire tip of the device including a few mm of the device itself. Unsheathing was continued until the device started opening in its distal aspect. This was then retrieved more proximally to the distal landing zone which was just proximal to the entry point of the contralateral left vertebral artery. Once there, the remainder of the device was then deployed by advancing the microcatheter using the fluffing technique with the microcatheter to promote apposition with the native vessel. The Phenom microcatheter was then advanced over the micro guidewire into the distal basilar artery. Using a torque device, the micro guidewire was then captured into the microcatheter and removed. The microcatheter was connected to continuous heparinized saline infusion. A control arteriogram performed through the 5 [place] Catalyst guide catheter in the distal right vertebral artery demonstrated excellent apposition proximally and distally with moderate slow flow through the right posterior-inferior cerebellar artery and filling of the aneurysm. Control arteriograms were then performed at 15 and 30 minutes post deployment of the initial device. These continued to demonstrate excellent  apposition without evidence of intraluminal filling defects or of occlusions. However, there continued to be a modest jet like innominate along the inferior aspect of the aneurysm. This prompted the use of a second flow diverter device. It was decided to use a 4.5 mm x 20 mm pipeline flex flow diverter device. This was again advanced to the distal end of the Phenom 027 microcatheter using biplane roadmap technique and constant fluoroscopic guidance in a coaxial manner and with constant heparinized saline infusion. This in turn was then visualized by retrieving the microcatheter. Once the distal 3 mm of the device was opened, the combination was then retrieved such that the landing zone of this was going to be in the proximal horizontal segment just distal to the right posterior-inferior cerebellar artery. Once in that position,, again using the pushing technique with the micro guidewire to deploy and the fluffing technique to stabilize the device the combination was utilized to deploy the entire device. Once completely deployed, a control arteriogram performed through the Catalyst guide catheter in the distal right vertebral artery demonstrated excellent apposition proximally and distally. There was double coverage at the origin of the right posterior-inferior cerebellar artery. A control arteriogram performed through the Catalyst guide catheter at 10 and 20 minutes post deployment of the second device continued to demonstrate excellent apposition with now slower flow in the region of the neck of the aneurysm. However, patency of the right posterior-inferior cerebellar artery continued to be maintained. A final control arteriogram performed through the 5 [place] Catalyst guide catheter in the right vertebral artery demonstrated excellent apposition without evidence of intraluminal filling defects or of occlusions, in the posterior cerebral circulation. Throughout the procedure, the patient's blood pressure and  neurological status remained stable. The patient's ACT was in the region of 297. This  prompted the use of approximately 15 mg of protamine sulfate to partially reverse the effect of the heparin. Thereafter, the 5 [place] Catalyst guide catheter, and the 8 [place] Neuron Max sheath were then retrieved into the abdominal aorta and exchanged over a J-tip guidewire for a 6 [place] Pinnacle sheath. This was then removed with hemostasis achieved manually in the right groin at the puncture site over 30 minutes. The patient's general anesthesia was then reversed and the patient was then extubated without difficulty. Upon recovery the patient demonstrated no new neurological signs or symptoms. She did not complain of any nausea. She did complain of mild headache. She was able to move all her fours equally proportional to the effort. Distal pulses remained palpable in the dorsalis pedis and posterior tibial regions bilaterally at the end of the procedure. She was then transferred to the neuro ICU to continue on low-dose IV heparin, close neurologic observations, and maintaining her adequate blood pressure. The patient did develop oozing from her right groin whilst being transferred from the gurney to her bed. Pressure was held for about 15-20 minutes with hemostasis. A pressure bandage was applied. Overnight the patient complained of nausea with mild headache. This responded to 12.5 mg of Phenergan rectal suppository. She was able to take in free liquids in small amounts. The following morning, the IV heparin was stopped, the patient was switched to aspirin 81 mg a day, and Plavix 75 mg a day. Again while being sat up the patient had small oozing through the right groin puncture site. Again pressure was held for about 10 minutes. The patient was to be laid flat for 2 hours. Also encouraged was for the patient to start eating and drinking. The patient's potassium was also 3.1. This was replaced with oral potassium. The patient  and her daughter were advised that upon her discharge, the patient was to rest overnight lying flat. She could ambulate using a walker or a cane the following morning. She was advised to maintain adequate hydration by drinking water. She was strongly advised to continue taking her aspirin 81 mg a day, and Plavix. She was to avoid stooping, bending or lifting weights above 10 pounds for a couple of weeks. She was also advised not to drive for a couple of weeks. The daughter was informed that should there be a slight oozing in the right groin puncture site to hold maintained pressure for approximately 10-15 minutes and keep the patient flat. Should she develop new neurological signs or symptoms or worsening headaches, visual problems, motor weakness, to call 911. The patient will be seen in follow-up in outpatient clinic in 2 weeks post discharge. 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 PLAN: Follow-up in the clinic in 2 weeks time. Electronically Signed   By: Luanne Bras M.D.   On: 09/06/2018 11:50   Ir Angio Intra Extracran Sel Com Carotid Innominate Bilat Mod Sed  Result Date: 09/07/2018 CLINICAL DATA:  Worsening headaches. 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 Past history of right occipital craniotomy. EXAM: ARTERIOGRAPHY; WORKSTATION 3D RECONSTRUCTION; TRANSCATHETER THERAPY EMBOLIZATION; IR ANGIO VERTEBRAL SEL VERTEBRAL UNI RIGHT MOD SED; BILATERAL COMMON CAROTID AND INNOMINATE ANGIOGRAPHY COMPARISON:  CT angiogram of the head and neck of 08/30/2018. MEDICATIONS: Heparin 4,000 units IV; Ancef 2 g IV was administered within 1 hour of the  procedure. ANESTHESIA/SEDATION: Mac anesthesia for the diagnostic portion of the examination, and general anesthesia for the embolization  portion of the examination by the department of Anesthesiology at [facility]:  Isovue 300 approximately 110 mL. FLUOROSCOPY TIME:  Fluoroscopy Time: 61 minutes 6 seconds (3786 mGy). COMPLICATIONS: None immediate. TECHNIQUE: Informed written consent was obtained from the patient after a thorough discussion of the procedural risks, benefits and alternatives. All questions were addressed. Maximal Sterile Barrier Technique was utilized including caps, mask, sterile gowns, sterile gloves, sterile drape, hand hygiene and skin antiseptic. A timeout was performed prior to the initiation of the procedure. The right groin was prepped and draped in the usual sterile fashion. Thereafter using modified Seldinger technique, transfemoral access into the right common femoral artery was obtained without difficulty. Over a 0.035 inch guidewire, a 5 [place] Pinnacle sheath was inserted. Through this, and also over 0.035 inch guidewire, a 5 [place] JB 1 catheter was advanced to the aortic arch region and selectively positioned in the right common carotid artery, the right vertebral artery, and the left common carotid artery. Also performed was a 3D rotational arteriogram of the right posterior circulation via injection to the right vertebral artery. Reformations were then created on a separate workstation. Distal to this the right vertebrobasilar junction demonstrates wide patency into the basilar artery. The basilar artery, the left posterior cerebral artery, the superior cerebellar arteries and the anterior-inferior cerebellar arteries opacify into the capillary and venous phases. FINDINGS: The right common carotid arteriogram demonstrates the right external carotid artery and its major branches to be widely patent. The right internal carotid artery at the bulb to the cranial skull base demonstrates wide patency. The petrous, cavernous and supraclinoid segments are widely patent. A right posterior communicating  artery is seen opacifying the right posterior cerebral artery distribution. The right middle cerebral artery and the right anterior cerebral artery opacify into the capillary and venous phases. The dominant right vertebral artery origin is widely patent. The vessel is seen to opacify to the cranial skull base. There is mild caliber irregularity involving the distal right vertebral artery in the cervical segment. Distal to this the origin of the right posterior-inferior cerebellar artery is also patent. There is mild fusiform dilatation of the right vertebrobasilar junction adjacent to the origin of the right posterior-inferior cerebellar artery with a nubbin along the lateral superior wall. A similar less prominent indentation is noted more proximally. Arising from the proximal 1/3 of the right posterior-inferior cerebellar artery is a wide necked irregular approximately 6 mm x 5 mm aneurysm. Flash filling of the right posterior cerebral artery P1 is noted. Unopacified blood is also noted in the basilar artery from the contralateral vertebral artery. The left common carotid arteriogram demonstrates the left external carotid artery and its major branches to be widely patent. The left internal carotid artery at the bulb to the cranial skull base demonstrates wide patency. The petrous, cavernous and supraclinoid segments are widely patent. A small infundibulum is seen in the left posterior communicating artery region. The left middle cerebral artery and the left anterior cerebral artery opacify into the capillary and venous phases. 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 The angio findings were reviewed with patient and the patient's daughter. Brought to their attention was the irregular right posterior-inferior cerebellar artery aneurysm, and the beaking effect at the distal  aspect of  the right vertebrobasilar junction probably representing the superior margin of the partially thrombosed large to giant aneurysm. Options mentioned to them were those of stent assisted coiling versus placement of a flow diversion within the left posterior inferior cerebellar artery, and/or across the origin of the left posterior-inferior cerebellar artery. The patient and daughter were agreeable to proceed with endovascular treatment. The patient was then put under general anesthesia by the Department of Anesthesiology. The diagnostic JB 1 catheter in the right subclavian artery distally was exchanged over a 0.035 inch 300 cm Rosen exchange guidewire for an 85 cm 8 [place] Neuron Max guide sheath. This was advanced with its distal tip distal to the origin of the right vertebral artery. This was then connected to continuous heparinized saline infusion. Over the [employer] guidewire, a 5 [place] 115 cm Catalyst guide catheter was then advanced and positioned distal to the tip of the Neuron Max sheath. The guidewire was removed. Good aspiration obtained from the hub of the 5 [place] Catalyst guide catheter. The combination was then retrieved more proximally and proximal to the origin of the right vertebral artery. Using biplane roadmap technique and constant fluoroscopic guidance, a 0.035 inch Roadrunner guidewire was then advanced through the Catalyst guide catheter. Access into the right vertebral artery was obtained. The wire was advanced to the distal right vertebral artery followed by the Catalyst guide catheter. Also at this time, the 8 [place] Neuron Max guide sheath was advanced to the proximal 1/3 of the right vertebral artery. The guidewire was removed. Good aspiration obtained from the hub of the Catalyst guide catheter. A gentle control arteriogram performed through the Catalyst guide catheter demonstrated no evidence of spasms, dissections or of intraluminal filling defects. Arteriogram was then  performed in the biplane DSA mode of the basilar artery. Measurements were performed of the right vertebral artery proximal to the right PICA, and distal to the right posterior-inferior cerebellar artery and the distal right vertebrobasilar junction. Again noted was a nubbin arising from the lateral superior wall of the right vertebrobasilar junction distal to the origin of the right posterior-inferior cerebellar artery. 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 An 027 Phenom 150 cm microcatheter was then advanced over a 0.014 inch Softip Synchro micro guidewire to the distal end of the Catalyst guide catheter. With the micro guidewire leading with a J-tip configuration, access was obtained into the origin of the right posterior-inferior cerebellar artery. The Phenom microcatheter was then advanced to close proximity to the origin of the right posterior-inferior cerebellar artery. Using a torque device, the wire was then manipulated to advance passed the wide neck aneurysm of the right posterior-inferior cerebellar artery. Access distal to the neck of the aneurysm could not be achieved with the micro guidewire. Therefore, this was then abandoned. The Phenom microcatheter was then advanced to the distal basilar artery just proximal to the origin of the posterior cerebral arteries over a 0.014 inch Softip Synchro micro guidewire. The guidewire was removed. Good aspiration obtained from the hub of the Phenom 027 microcatheter. A gentle contrast injection demonstrated safe position of the tip of the microcatheter. This was then connected to continuous heparinized saline infusion. Following evaluation of the 3D images, and the diagnostic catheter arteriograms, it was decided to use a 4.5 mm x 35 mm pipeline flex flow diverter device across the neck of the right posterior-inferior cerebellar artery. This was then advanced in a coaxial  manner and  with constant heparinized saline infusion using biplane roadmap technique to the distal end of the tip of the Phenom microcatheter. The microcatheter was then retrieved unsheathing the distal wire tip of the device including a few mm of the device itself. Unsheathing was continued until the device started opening in its distal aspect. This was then retrieved more proximally to the distal landing zone which was just proximal to the entry point of the contralateral left vertebral artery. Once there, the remainder of the device was then deployed by advancing the microcatheter using the fluffing technique with the microcatheter to promote apposition with the native vessel. The Phenom microcatheter was then advanced over the micro guidewire into the distal basilar artery. Using a torque device, the micro guidewire was then captured into the microcatheter and removed. The microcatheter was connected to continuous heparinized saline infusion. A control arteriogram performed through the 5 [place] Catalyst guide catheter in the distal right vertebral artery demonstrated excellent apposition proximally and distally with moderate slow flow through the right posterior-inferior cerebellar artery and filling of the aneurysm. Control arteriograms were then performed at 15 and 30 minutes post deployment of the initial device. These continued to demonstrate excellent apposition without evidence of intraluminal filling defects or of occlusions. However, there continued to be a modest jet like innominate along the inferior aspect of the aneurysm. This prompted the use of a second flow diverter device. It was decided to use a 4.5 mm x 20 mm pipeline flex flow diverter device. This was again advanced to the distal end of the Phenom 027 microcatheter using biplane roadmap technique and constant fluoroscopic guidance in a coaxial manner and with constant heparinized saline infusion. This in turn was then visualized by retrieving  the microcatheter. Once the distal 3 mm of the device was opened, the combination was then retrieved such that the landing zone of this was going to be in the proximal horizontal segment just distal to the right posterior-inferior cerebellar artery. Once in that position,, again using the pushing technique with the micro guidewire to deploy and the fluffing technique to stabilize the device the combination was utilized to deploy the entire device. Once completely deployed, a control arteriogram performed through the Catalyst guide catheter in the distal right vertebral artery demonstrated excellent apposition proximally and distally. There was double coverage at the origin of the right posterior-inferior cerebellar artery. A control arteriogram performed through the Catalyst guide catheter at 10 and 20 minutes post deployment of the second device continued to demonstrate excellent apposition with now slower flow in the region of the neck of the aneurysm. However, patency of the right posterior-inferior cerebellar artery continued to be maintained. A final control arteriogram performed through the 5 [place] Catalyst guide catheter in the right vertebral artery demonstrated excellent apposition without evidence of intraluminal filling defects or of occlusions, in the posterior cerebral circulation. Throughout the procedure, the patient's blood pressure and neurological status remained stable. The patient's ACT was in the region of 297. This prompted the use of approximately 15 mg of protamine sulfate to partially reverse the effect of the heparin. Thereafter, the 5 [place] Catalyst guide catheter, and the 8 [place] Neuron Max sheath were then retrieved into the abdominal aorta and exchanged over a J-tip guidewire for a 6 [place] Pinnacle sheath. This was then removed with hemostasis achieved manually in the right groin at the puncture site over 30 minutes. The patient's general anesthesia was then reversed and the patient  was then extubated without difficulty. Upon  recovery the patient demonstrated no new neurological signs or symptoms. She did not complain of any nausea. She did complain of mild headache. She was able to move all her fours equally proportional to the effort. Distal pulses remained palpable in the dorsalis pedis and posterior tibial regions bilaterally at the end of the procedure. She was then transferred to the neuro ICU to continue on low-dose IV heparin, close neurologic observations, and maintaining her adequate blood pressure. The patient did develop oozing from her right groin whilst being transferred from the gurney to her bed. Pressure was held for about 15-20 minutes with hemostasis. A pressure bandage was applied. Overnight the patient complained of nausea with mild headache. This responded to 12.5 mg of Phenergan rectal suppository. She was able to take in free liquids in small amounts. The following morning, the IV heparin was stopped, the patient was switched to aspirin 81 mg a day, and Plavix 75 mg a day. Again while being sat up the patient had small oozing through the right groin puncture site. Again pressure was held for about 10 minutes. The patient was to be laid flat for 2 hours. Also encouraged was for the patient to start eating and drinking. The patient's potassium was also 3.1. This was replaced with oral potassium. The patient and her daughter were advised that upon her discharge, the patient was to rest overnight lying flat. She could ambulate using a walker or a cane the following morning. She was advised to maintain adequate hydration by drinking water. She was strongly advised to continue taking her aspirin 81 mg a day, and Plavix. She was to avoid stooping, bending or lifting weights above 10 pounds for a couple of weeks. She was also advised not to drive for a couple of weeks. The daughter was informed that should there be a slight oozing in the right groin puncture site to hold  maintained pressure for approximately 10-15 minutes and keep the patient flat. Should she develop new neurological signs or symptoms or worsening headaches, visual problems, motor weakness, to call 911. The patient will be seen in follow-up in outpatient clinic in 2 weeks post discharge. 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 PLAN: Follow-up in the clinic in 2 weeks time. Electronically Signed   By: Luanne Bras M.D.   On: 09/06/2018 11:50   Ir Angio Vertebral Sel Vertebral Uni R Mod Sed  Result Date: 09/07/2018 CLINICAL DATA:  Worsening headaches. 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 Past history of right occipital craniotomy. EXAM: ARTERIOGRAPHY; WORKSTATION 3D RECONSTRUCTION; TRANSCATHETER THERAPY EMBOLIZATION; IR ANGIO VERTEBRAL SEL VERTEBRAL UNI RIGHT MOD SED; BILATERAL COMMON CAROTID AND INNOMINATE ANGIOGRAPHY COMPARISON:  CT angiogram of the head and neck of 08/30/2018. MEDICATIONS: Heparin 4,000 units IV; Ancef 2 g IV was administered within 1 hour of the procedure. ANESTHESIA/SEDATION: Mac anesthesia for the diagnostic portion of the examination, and general anesthesia for the embolization portion of the examination by the department of Anesthesiology at [facility]:  Isovue 300 approximately 110 mL. FLUOROSCOPY TIME:  Fluoroscopy Time: 61 minutes 6 seconds (3786 mGy). COMPLICATIONS: None immediate. TECHNIQUE: Informed written consent was obtained from the patient after a thorough discussion of the procedural risks, benefits and alternatives. All questions were addressed. Maximal Sterile Barrier Technique was utilized including caps, mask, sterile gowns, sterile gloves,  sterile drape, hand hygiene and skin antiseptic. A timeout was performed prior to the  initiation of the procedure. The right groin was prepped and draped in the usual sterile fashion. Thereafter using modified Seldinger technique, transfemoral access into the right common femoral artery was obtained without difficulty. Over a 0.035 inch guidewire, a 5 French Pinnacle sheath was inserted. Through this, and also over 0.035 inch guidewire, a 5 [place] JB 1 catheter was advanced to the aortic arch region and selectively positioned in the right common carotid artery, the right vertebral artery, and the left common carotid artery. Also performed was a 3D rotational arteriogram of the right posterior circulation via injection to the right vertebral artery. Reformations were then created on a separate workstation. Distal to this the right vertebrobasilar junction demonstrates wide patency into the basilar artery. The basilar artery, the left posterior cerebral artery, the superior cerebellar arteries and the anterior-inferior cerebellar arteries opacify into the capillary and venous phases. FINDINGS: The right common carotid arteriogram demonstrates the right external carotid artery and its major branches to be widely patent. The right internal carotid artery at the bulb to the cranial skull base demonstrates wide patency. The petrous, cavernous and supraclinoid segments are widely patent. A right posterior communicating artery is seen opacifying the right posterior cerebral artery distribution. The right middle cerebral artery and the right anterior cerebral artery opacify into the capillary and venous phases. The dominant right vertebral artery origin is widely patent. The vessel is seen to opacify to the cranial skull base. There is mild caliber irregularity involving the distal right vertebral artery in the cervical segment. Distal to this the origin of the right posterior-inferior cerebellar artery is also patent. There is mild fusiform dilatation of the right vertebrobasilar junction adjacent to the  origin of the right posterior-inferior cerebellar artery with a nubbin along the lateral superior wall. A similar less prominent indentation is noted more proximally. Arising from the proximal 1/3 of the right posterior-inferior cerebellar artery is a wide necked irregular approximately 6 mm x 5 mm aneurysm. Flash filling of the right posterior cerebral artery P1 is noted. Unopacified blood is also noted in the basilar artery from the contralateral vertebral artery. The left common carotid arteriogram demonstrates the left external carotid artery and its major branches to be widely patent. The left internal carotid artery at the bulb to the cranial skull base demonstrates wide patency. The petrous, cavernous and supraclinoid segments are widely patent. A small infundibulum is seen in the left posterior communicating artery region. The left middle cerebral artery and the left anterior cerebral artery opacify into the capillary and venous phases. 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 The angio findings were reviewed with patient and the patient's daughter. Brought to their attention was the irregular right posterior-inferior cerebellar artery aneurysm, and the beaking effect at the distal aspect of the right vertebrobasilar junction probably representing the superior margin of the partially thrombosed large to giant aneurysm. Options mentioned to them were those of stent assisted coiling versus placement of a flow diversion within the left posterior inferior cerebellar artery, and/or across the origin of the left posterior-inferior cerebellar artery. The patient and daughter were agreeable to proceed with endovascular treatment. The patient was then put under general anesthesia by the Department of Anesthesiology. The diagnostic JB 1 catheter in the right subclavian artery  distally was exchanged over  a 0.035 inch 300 cm Rosen exchange guidewire for an 85 cm 8 [place] Neuron Max guide sheath. This was advanced with its distal tip distal to the origin of the right vertebral artery. This was then connected to continuous heparinized saline infusion. Over the [employer] guidewire, a 5 French 115 cm Catalyst guide catheter was then advanced and positioned distal to the tip of the Neuron Max sheath. The guidewire was removed. Good aspiration obtained from the hub of the 5 [place] Catalyst guide catheter. The combination was then retrieved more proximally and proximal to the origin of the right vertebral artery. Using biplane roadmap technique and constant fluoroscopic guidance, a 0.035 inch Roadrunner guidewire was then advanced through the Catalyst guide catheter. Access into the right vertebral artery was obtained. The wire was advanced to the distal right vertebral artery followed by the Catalyst guide catheter. Also at this time, the 8 [place] Neuron Max guide sheath was advanced to the proximal 1/3 of the right vertebral artery. The guidewire was removed. Good aspiration obtained from the hub of the Catalyst guide catheter. A gentle control arteriogram performed through the Catalyst guide catheter demonstrated no evidence of spasms, dissections or of intraluminal filling defects. Arteriogram was then performed in the biplane DSA mode of the basilar artery. Measurements were performed of the right vertebral artery proximal to the right PICA, and distal to the right posterior-inferior cerebellar artery and the distal right vertebrobasilar junction. Again noted was a nubbin arising from the lateral superior wall of the right vertebrobasilar junction distal to the origin of the right posterior-inferior cerebellar artery. 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 An 027 Phenom 150 cm microcatheter was  then advanced over a 0.014 inch Softip Synchro micro guidewire to the distal end of the Catalyst guide catheter. With the micro guidewire leading with a J-tip configuration, access was obtained into the origin of the right posterior-inferior cerebellar artery. The Phenom microcatheter was then advanced to close proximity to the origin of the right posterior-inferior cerebellar artery. Using a torque device, the wire was then manipulated to advance passed the wide neck aneurysm of the right posterior-inferior cerebellar artery. Access distal to the neck of the aneurysm could not be achieved with the micro guidewire. Therefore, this was then abandoned. The Phenom microcatheter was then advanced to the distal basilar artery just proximal to the origin of the posterior cerebral arteries over a 0.014 inch Softip Synchro micro guidewire. The guidewire was removed. Good aspiration obtained from the hub of the Phenom 027 microcatheter. A gentle contrast injection demonstrated safe position of the tip of the microcatheter. This was then connected to continuous heparinized saline infusion. Following evaluation of the 3D images, and the diagnostic catheter arteriograms, it was decided to use a 4.5 mm x 35 mm pipeline flex flow diverter device across the neck of the right posterior-inferior cerebellar artery. This was then advanced in a coaxial manner and with constant heparinized saline infusion using biplane roadmap technique to the distal end of the tip of the Phenom microcatheter. The microcatheter was then retrieved unsheathing the distal wire tip of the device including a few mm of the device itself. Unsheathing was continued until the device started opening in its distal aspect. This was then retrieved more proximally to the distal landing zone which was just proximal to the entry point of the contralateral left vertebral artery. Once there, the remainder of the device was then deployed by  advancing the microcatheter  using the fluffing technique with the microcatheter to promote apposition with the native vessel. The Phenom microcatheter was then advanced over the micro guidewire into the distal basilar artery. Using a torque device, the micro guidewire was then captured into the microcatheter and removed. The microcatheter was connected to continuous heparinized saline infusion. A control arteriogram performed through the 5 [place] Catalyst guide catheter in the distal right vertebral artery demonstrated excellent apposition proximally and distally with moderate slow flow through the right posterior-inferior cerebellar artery and filling of the aneurysm. Control arteriograms were then performed at 15 and 30 minutes post deployment of the initial device. These continued to demonstrate excellent apposition without evidence of intraluminal filling defects or of occlusions. However, there continued to be a modest jet like innominate along the inferior aspect of the aneurysm. This prompted the use of a second flow diverter device. It was decided to use a 4.5 mm x 20 mm pipeline flex flow diverter device. This was again advanced to the distal end of the Phenom 027 microcatheter using biplane roadmap technique and constant fluoroscopic guidance in a coaxial manner and with constant heparinized saline infusion. This in turn was then visualized by retrieving the microcatheter. Once the distal 3 mm of the device was opened, the combination was then retrieved such that the landing zone of this was going to be in the proximal horizontal segment just distal to the right posterior-inferior cerebellar artery. Once in that position,, again using the pushing technique with the micro guidewire to deploy and the fluffing technique to stabilize the device the combination was utilized to deploy the entire device. Once completely deployed, a control arteriogram performed through the Catalyst guide catheter in the distal right vertebral artery  demonstrated excellent apposition proximally and distally. There was double coverage at the origin of the right posterior-inferior cerebellar artery. A control arteriogram performed through the Catalyst guide catheter at 10 and 20 minutes post deployment of the second device continued to demonstrate excellent apposition with now slower flow in the region of the neck of the aneurysm. However, patency of the right posterior-inferior cerebellar artery continued to be maintained. A final control arteriogram performed through the 5 [place] Catalyst guide catheter in the right vertebral artery demonstrated excellent apposition without evidence of intraluminal filling defects or of occlusions, in the posterior cerebral circulation. Throughout the procedure, the patient's blood pressure and neurological status remained stable. The patient's ACT was in the region of 297. This prompted the use of approximately 15 mg of protamine sulfate to partially reverse the effect of the heparin. Thereafter, the 5 [place] Catalyst guide catheter, and the 8 [place] Neuron Max sheath were then retrieved into the abdominal aorta and exchanged over a J-tip guidewire for a 6 [place] Pinnacle sheath. This was then removed with hemostasis achieved manually in the right groin at the puncture site over 30 minutes. The patient's general anesthesia was then reversed and the patient was then extubated without difficulty. Upon recovery the patient demonstrated no new neurological signs or symptoms. She did not complain of any nausea. She did complain of mild headache. She was able to move all her fours equally proportional to the effort. Distal pulses remained palpable in the dorsalis pedis and posterior tibial regions bilaterally at the end of the procedure. She was then transferred to the neuro ICU to continue on low-dose IV heparin, close neurologic observations, and maintaining her adequate blood pressure. The patient did develop oozing from her right  groin whilst  being transferred from the gurney to her bed. Pressure was held for about 15-20 minutes with hemostasis. A pressure bandage was applied. Overnight the patient complained of nausea with mild headache. This responded to 12.5 mg of Phenergan rectal suppository. She was able to take in free liquids in small amounts. The following morning, the IV heparin was stopped, the patient was switched to aspirin 81 mg a day, and Plavix 75 mg a day. Again while being sat up the patient had small oozing through the right groin puncture site. Again pressure was held for about 10 minutes. The patient was to be laid flat for 2 hours. Also encouraged was for the patient to start eating and drinking. The patient's potassium was also 3.1. This was replaced with oral potassium. The patient and her daughter were advised that upon her discharge, the patient was to rest overnight lying flat. She could ambulate using a walker or a cane the following morning. She was advised to maintain adequate hydration by drinking water. She was strongly advised to continue taking her aspirin 81 mg a day, and Plavix. She was to avoid stooping, bending or lifting weights above 10 pounds for a couple of weeks. She was also advised not to drive for a couple of weeks. The daughter was informed that should there be a slight oozing in the right groin puncture site to hold maintained pressure for approximately 10-15 minutes and keep the patient flat. Should she develop new neurological signs or symptoms or worsening headaches, visual problems, motor weakness, to call 911. The patient will be seen in follow-up in outpatient clinic in 2 weeks post discharge. 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 PLAN: Follow-up in the clinic in 2 weeks time. Electronically Signed   By: Luanne Bras M.D.   On: 09/06/2018 11:50       Subjective: Patient seen and examined the bedside this morning.  Remains comfortable.  Hemodynamically stable.  Right-sided facial lesions improving.  Discharge Exam: Vitals:   10/04/18 0621 10/04/18 1435  BP: 126/83 (!) 116/51  Pulse: 92 (!) 115  Resp:  20  Temp: 98.2 F (36.8 C) 98.6 F (37 C)  SpO2: 97% 97%   Vitals:   10/04/18 0017 10/04/18 0621 10/04/18 1300 10/04/18 1435  BP: 120/76 126/83  (!) 116/51  Pulse: (!) 103 92  (!) 115  Resp:    20  Temp: 98.2 F (36.8 C) 98.2 F (36.8 C)  98.6 F (37 C)  TempSrc: Oral Oral  Oral  SpO2: 96% 97%  97%  Weight:   49.9 kg     General: Pt is alert, awake, not in acute distress Cardiovascular: RRR, S1/S2 +, no rubs, no gallops Respiratory: CTA bilaterally, no wheezing, no rhonchi Abdominal: Soft, NT, ND, bowel sounds + Extremities: no edema, no cyanosis Face: Crusted vesicular lesions with erythema on the right side of the face just below the right eye.    The results of significant diagnostics from this hospitalization (including imaging, microbiology, ancillary and laboratory) are listed below for reference.     Microbiology: Recent Results (from the past 240 hour(s))  Culture, blood (routine x 2)     Status: None (Preliminary result)   Collection Time: 10/03/18  1:58 PM  Result Value Ref Range Status   Specimen Description BLOOD LEFT FOREARM  Final   Special Requests  Final    BOTTLES DRAWN AEROBIC AND ANAEROBIC Blood Culture results may not be optimal due to an excessive volume of blood received in culture bottles   Culture   Final    NO GROWTH < 24 HOURS Performed at [facility] [address], [place], [place] [address]    Report Status PENDING  Incomplete  Culture, blood (routine x 2)     Status: None (Preliminary result)   Collection Time: 10/03/18  2:08 PM  Result Value Ref Range Status   Specimen Description BLOOD LEFT ANTECUBITAL  Final   Special  Requests   Final    BOTTLES DRAWN AEROBIC AND ANAEROBIC Blood Culture adequate volume   Culture   Final    NO GROWTH < 24 HOURS Performed at [facility], [place] [address] [place], [place] [address]    Report Status PENDING  Incomplete     Labs: BNP (last 3 results) No results for input(s): BNP in the last 8760 hours. Basic Metabolic Panel: Recent Labs  Lab 10/03/18 1303 10/04/18 0506  NA 138 137  K 3.7 3.6  CL 102 104  CO2 26 24  GLUCOSE 91 112*  BUN 12 11  CREATININE 0.76 0.73  CALCIUM 9.4 9.0   Liver Function Tests: Recent Labs  Lab 10/03/18 1303  AST 20  ALT 17  ALKPHOS 83  BILITOT 0.8  PROT 6.9  ALBUMIN 3.8   No results for input(s): LIPASE, AMYLASE in the last 168 hours. No results for input(s): AMMONIA in the last 168 hours. CBC: Recent Labs  Lab 10/03/18 1303 10/04/18 0506  WBC 5.7 4.9  NEUTROABS 3.8  --   HGB 12.1 11.3*  HCT 38.8 35.3*  MCV 91.9 91.7  PLT 240 240   Cardiac Enzymes: No results for input(s): CKTOTAL, CKMB, CKMBINDEX, TROPONINI in the last 168 hours. BNP: Invalid input(s): POCBNP CBG: No results for input(s): GLUCAP in the last 168 hours. D-Dimer No results for input(s): DDIMER in the last 72 hours. Hgb A1c No results for input(s): HGBA1C in the last 72 hours. Lipid Profile No results for input(s): CHOL, HDL, LDLCALC, TRIG, CHOLHDL, LDLDIRECT in the last 72 hours. Thyroid function studies No results for input(s): TSH, T4TOTAL, T3FREE, THYROIDAB in the last 72 hours.  Invalid input(s): FREET3 Anemia work up No results for input(s): VITAMINB12, FOLATE, FERRITIN, TIBC, IRON, RETICCTPCT in the last 72 hours. Urinalysis No results found for: COLORURINE, APPEARANCEUR, [place], [place], [place], [place], [place], [place], PROTEINUR, UROBILINOGEN, NITRITE, LEUKOCYTESUR Sepsis Labs Invalid input(s): PROCALCITONIN,  WBC,  LACTICIDVEN Microbiology Recent Results (from the past 240 hour(s))  Culture, blood (routine x 2)      Status: None (Preliminary result)   Collection Time: 10/03/18  1:58 PM  Result Value Ref Range Status   Specimen Description BLOOD LEFT FOREARM  Final   Special Requests   Final    BOTTLES DRAWN AEROBIC AND ANAEROBIC Blood Culture results may not be optimal due to an excessive volume of blood received in culture bottles   Culture   Final    NO GROWTH < 24 HOURS Performed at [facility], [place] [address], [place],  [address]    Report Status PENDING  Incomplete  Culture, blood (routine x 2)     Status: None (Preliminary result)   Collection Time: 10/03/18  2:08 PM  Result Value Ref Range Status   Specimen Description BLOOD LEFT ANTECUBITAL  Final   Special Requests   Final  BOTTLES DRAWN AEROBIC AND ANAEROBIC Blood Culture adequate volume   Culture   Final    NO GROWTH < 24 HOURS Performed at [facility], [place] [address] [place], [place] [address]    Report Status PENDING  Incomplete    Please note: You were cared for by a hospitalist during your hospital stay. Once you are discharged, your primary care physician will handle any further medical issues. Please note that NO REFILLS for any discharge medications will be authorized once you are discharged, as it is imperative that you return to your primary care physician (or establish a relationship with a primary care physician if you do not have one) for your post hospital discharge needs so that they can reassess your need for medications and monitor your lab values.    Time coordinating discharge: 40 minutes  SIGNED:   Shelly Coss, MD  [facility] 10/04/2018, 3:18 PM Pager [phone]  If 7PM-7AM, please contact night-coverage [email or web address] Password [email or web address]

## 2018-10-04 NOTE — Plan of Care (Signed)
  Problem: Clinical Measurements: Goal: Ability to maintain clinical measurements within normal limits will improve Outcome: Progressing   Problem: Coping: Goal: Level of anxiety will decrease Outcome: Progressing   Problem: Pain Managment: Goal: General experience of comfort will improve Outcome: Progressing   Problem: Skin Integrity: Goal: Skin integrity will improve Outcome: Progressing

## 2018-10-08 LAB — CULTURE, BLOOD (ROUTINE X 2)
CULTURE: NO GROWTH
Culture: NO GROWTH
Special Requests: ADEQUATE

## 2018-10-10 DIAGNOSIS — Z09 Encounter for follow-up examination after completed treatment for conditions other than malignant neoplasm: Secondary | ICD-10-CM | POA: Diagnosis not present

## 2018-10-10 DIAGNOSIS — B028 Zoster with other complications: Secondary | ICD-10-CM | POA: Diagnosis not present

## 2018-10-10 DIAGNOSIS — L03211 Cellulitis of face: Secondary | ICD-10-CM | POA: Diagnosis not present

## 2018-11-08 ENCOUNTER — Telehealth: Payer: Self-pay | Admitting: Radiology

## 2018-11-08 ENCOUNTER — Other Ambulatory Visit: Payer: Self-pay | Admitting: Student

## 2018-11-08 ENCOUNTER — Telehealth (HOSPITAL_COMMUNITY): Payer: Self-pay

## 2018-11-08 NOTE — Telephone Encounter (Signed)
Left message for pt to come in anytime this week between 8am - 4pm to get labwork. No appointment needed. AW  Pt called back and will come in during above times one day this week. AW

## 2018-11-08 NOTE — Progress Notes (Signed)
   Hx  R PICA aneurysm  Pipeline stent x 2 09/07/18 P2y12 09/19/18: 64  Calls with questions regarding noted bleeding She states she has 2 occasions of red blood on toilet tissue after passing urine. One time last week and again over this weekend Denies urinary symptoms: no burning; no frequency; no urgency Denies bleed ing from rectum  Could the blood be vaginal? Could be per pt--- she has had some low abd cramping-- "like menstrual cramps used to be". Hysterectomy 2001 But feels likely urinary source.  Denies fever; pain Denies bleeding any where else-- none from mouth; no bruising noted  She is taking Plavix and ASA daily Has missed a Plavix on 2 -3 occasions-- always takes as soon as she realizes missed.  Discussed with Dr Estanislado Pandy Pt can come for P2y12 to check level And Must see PMD for evaluation of bleeding  She has good understanding of this plan

## 2018-11-08 NOTE — Progress Notes (Signed)
  Spoke to [facility] [employer] She will call pt to come in for P2y12 this week  Pt knows to continue ASA/Plavix for now She is agreeable

## 2018-11-11 ENCOUNTER — Other Ambulatory Visit: Payer: Self-pay | Admitting: Student

## 2018-11-11 ENCOUNTER — Other Ambulatory Visit (HOSPITAL_COMMUNITY): Payer: Self-pay | Admitting: Radiology

## 2018-11-11 DIAGNOSIS — I671 Cerebral aneurysm, nonruptured: Secondary | ICD-10-CM

## 2018-11-23 DIAGNOSIS — R21 Rash and other nonspecific skin eruption: Secondary | ICD-10-CM | POA: Diagnosis not present

## 2018-12-01 ENCOUNTER — Other Ambulatory Visit (HOSPITAL_COMMUNITY): Payer: Self-pay

## 2018-12-01 LAB — PLATELET INHIBITION P2Y12: Platelet Function  P2Y12: 140 [PRU] — ABNORMAL LOW (ref 182–335)

## 2019-01-18 DIAGNOSIS — L089 Local infection of the skin and subcutaneous tissue, unspecified: Secondary | ICD-10-CM | POA: Diagnosis not present

## 2019-01-18 DIAGNOSIS — M419 Scoliosis, unspecified: Secondary | ICD-10-CM | POA: Diagnosis not present

## 2019-01-18 DIAGNOSIS — F3341 Major depressive disorder, recurrent, in partial remission: Secondary | ICD-10-CM | POA: Diagnosis not present

## 2019-01-18 DIAGNOSIS — F419 Anxiety disorder, unspecified: Secondary | ICD-10-CM | POA: Diagnosis not present

## 2019-02-22 DIAGNOSIS — L989 Disorder of the skin and subcutaneous tissue, unspecified: Secondary | ICD-10-CM | POA: Diagnosis not present

## 2019-03-28 ENCOUNTER — Telehealth (HOSPITAL_COMMUNITY): Payer: Self-pay

## 2019-03-28 NOTE — Telephone Encounter (Signed)
Called to schedule mri, pt does not want to schedule at this time. Try back in 2 months. AW

## 2019-05-09 DIAGNOSIS — F330 Major depressive disorder, recurrent, mild: Secondary | ICD-10-CM | POA: Diagnosis not present

## 2019-06-19 DIAGNOSIS — M810 Age-related osteoporosis without current pathological fracture: Secondary | ICD-10-CM | POA: Diagnosis not present

## 2019-06-22 DIAGNOSIS — I10 Essential (primary) hypertension: Secondary | ICD-10-CM | POA: Diagnosis not present

## 2019-06-22 DIAGNOSIS — E782 Mixed hyperlipidemia: Secondary | ICD-10-CM | POA: Diagnosis not present

## 2019-06-22 DIAGNOSIS — F419 Anxiety disorder, unspecified: Secondary | ICD-10-CM | POA: Diagnosis not present

## 2019-06-22 DIAGNOSIS — G8929 Other chronic pain: Secondary | ICD-10-CM | POA: Diagnosis not present

## 2019-06-22 DIAGNOSIS — M810 Age-related osteoporosis without current pathological fracture: Secondary | ICD-10-CM | POA: Diagnosis not present

## 2019-06-22 DIAGNOSIS — Z1389 Encounter for screening for other disorder: Secondary | ICD-10-CM | POA: Diagnosis not present

## 2019-06-22 DIAGNOSIS — Z0000 Encounter for general adult medical examination without abnormal findings: Secondary | ICD-10-CM | POA: Diagnosis not present

## 2019-07-10 DIAGNOSIS — L719 Rosacea, unspecified: Secondary | ICD-10-CM | POA: Diagnosis not present

## 2019-07-10 DIAGNOSIS — R946 Abnormal results of thyroid function studies: Secondary | ICD-10-CM | POA: Diagnosis not present

## 2019-07-10 DIAGNOSIS — I10 Essential (primary) hypertension: Secondary | ICD-10-CM | POA: Diagnosis not present

## 2019-07-10 DIAGNOSIS — J3489 Other specified disorders of nose and nasal sinuses: Secondary | ICD-10-CM | POA: Diagnosis not present

## 2019-07-10 DIAGNOSIS — Z23 Encounter for immunization: Secondary | ICD-10-CM | POA: Diagnosis not present

## 2019-07-10 DIAGNOSIS — E782 Mixed hyperlipidemia: Secondary | ICD-10-CM | POA: Diagnosis not present

## 2019-07-24 DIAGNOSIS — D500 Iron deficiency anemia secondary to blood loss (chronic): Secondary | ICD-10-CM | POA: Diagnosis not present

## 2019-11-02 ENCOUNTER — Telehealth (HOSPITAL_COMMUNITY): Payer: Self-pay | Admitting: Radiology

## 2019-11-02 NOTE — Telephone Encounter (Signed)
Pt called and has now decided she wants to schedule her follow-up MRI/MRA w/o. She was due for this back in April 2020 but held off due to [place]. Authorization request sent, will call to schedule when authorized. Pt agrees. JM

## 2019-11-06 ENCOUNTER — Other Ambulatory Visit (HOSPITAL_COMMUNITY): Payer: Self-pay | Admitting: Interventional Radiology

## 2019-11-06 DIAGNOSIS — I671 Cerebral aneurysm, nonruptured: Secondary | ICD-10-CM

## 2019-11-17 ENCOUNTER — Other Ambulatory Visit: Payer: Self-pay

## 2019-11-17 ENCOUNTER — Ambulatory Visit (HOSPITAL_COMMUNITY)
Admission: RE | Admit: 2019-11-17 | Discharge: 2019-11-17 | Disposition: A | Discharge status: Home / Self Care | Payer: PPO | Source: Ambulatory Visit | Attending: Interventional Radiology | Admitting: Interventional Radiology

## 2019-11-17 DIAGNOSIS — I671 Cerebral aneurysm, nonruptured: Secondary | ICD-10-CM | POA: Insufficient documentation

## 2019-11-21 ENCOUNTER — Telehealth (HOSPITAL_COMMUNITY): Payer: Self-pay

## 2019-11-21 NOTE — Telephone Encounter (Signed)
Called to schedule consult, no answer, left vm. AW  

## 2019-11-22 ENCOUNTER — Other Ambulatory Visit (HOSPITAL_COMMUNITY): Payer: Self-pay | Admitting: Interventional Radiology

## 2019-11-22 DIAGNOSIS — I671 Cerebral aneurysm, nonruptured: Secondary | ICD-10-CM

## 2019-11-28 ENCOUNTER — Ambulatory Visit (HOSPITAL_COMMUNITY)
Admission: RE | Admit: 2019-11-28 | Discharge: 2019-11-28 | Disposition: A | Discharge status: Home / Self Care | Payer: PPO | Source: Ambulatory Visit | Attending: Interventional Radiology | Admitting: Interventional Radiology

## 2019-11-28 ENCOUNTER — Other Ambulatory Visit: Payer: Self-pay

## 2019-11-28 DIAGNOSIS — Z9889 Other specified postprocedural states: Secondary | ICD-10-CM | POA: Diagnosis not present

## 2019-11-28 DIAGNOSIS — R519 Headache, unspecified: Secondary | ICD-10-CM | POA: Diagnosis not present

## 2019-11-28 DIAGNOSIS — M542 Cervicalgia: Secondary | ICD-10-CM | POA: Diagnosis not present

## 2019-11-28 DIAGNOSIS — I671 Cerebral aneurysm, nonruptured: Secondary | ICD-10-CM

## 2019-11-28 NOTE — Progress Notes (Signed)
Chief Complaint: Patient was seen in consultation today for right PICA aneurysm s/p embolization.  Referring Physician(s): Carol Ada  Supervising Physician: Luanne Bras  Patient Status: [facility] - Out-pt  History of Present Illness: Elaine Rivera is a 74 y.o. female with a past medical history as below, with pertinent past medical history including hypertension, GERD, nephrolithiasis, anemia, arthritis, and depression. She is known to [facility] and has been followed by Dr. Estanislado Pandy since 07/2018. She first presented to our department at the requested of Dr. Tamala Julian for management of an incidental finding of a right PICA aneurysm. She underwent an image-guided cerebral arteriogram with embolization of right PICA aneurysm using 2 telescoping pipeline flex flow diverters 09/05/2018 by Dr. Estanislado Pandy. She has since been followed by routine imaging scans to monitor for changes.  MRI/MRA brain/head 11/17/2019: 1. Partially thrombosed aneurysmal sac in the right cerebellomedullary angle cistern measuring 1.7 cm with residual filling measuring 7 mm. Although measurements appears slightly increased when compared to prior CTA, this may be related to differences in technique. 2. Absent flow void in most of the V4 segment of the left vertebral artery may represent occlusion versus slow flow. 3. Prominent supratentorial ventricles disproportional to the prominence of the sulci with mild bowing of the corpus callosum may represent normal pressure hydrocephalus in the appropriate clinical scenario. 4. Postsurgical changes from right suboccipital craniectomy.  Patient presents today for follow-up to discuss findings of recent MRI/MRA 11/17/2019. Patient awake and alert sitting in wheelchair. Accompanied by daughter. Complains of intermittent posterior headaches/neck pain for the past year. Rates headaches as 8/10 when at their worst. States that she takes Aleve and "lays down" with relief of  headaches. Complains of fatigue, loss of appetite, "feeling off balance" when ambulating, and dyspnea with exertion. Of note, patient's posture appears kyphotic today. Denies weakness, numbness/tingling, vision changes, hearing changes, tinnitus, or speech difficulty.   Past Medical History:  Diagnosis Date  . Anemia    years ago  . Arthritis   . Basal cell carcinoma of eye    biopsy of left eye/ non cancerous  . Complication of anesthesia    nausea/vomiting  . Depression   . GERD (gastroesophageal reflux disease)   . History of kidney stones   . Hypertension    no longer on medications    Past Surgical History:  Procedure Laterality Date  . CEREBRAL ANEURYSM REPAIR     1998  . COLONOSCOPY    . ECTOPIC PREGNANCY SURGERY     x2   . ESOPHAGOGASTRODUODENOSCOPY N/A 04/22/2016   Procedure: ESOPHAGOGASTRODUODENOSCOPY (EGD);  Surgeon: Juanita Craver, MD;  Location: [facility];  Service: Endoscopy;  Laterality: N/A;  . IR 3D INDEPENDENT WKST  09/05/2018  . IR ANGIO INTRA EXTRACRAN SEL COM CAROTID INNOMINATE BILAT MOD SED  09/05/2018  . IR ANGIO VERTEBRAL SEL VERTEBRAL UNI R MOD SED  09/05/2018  . IR ANGIOGRAM FOLLOW UP STUDY  09/05/2018  . IR TRANSCATH/EMBOLIZ  09/05/2018  . PARATHYROIDECTOMY     2002  . RADIOLOGY WITH ANESTHESIA N/A 09/05/2018   Procedure: EMBOLIZATION;  Surgeon: Radiologist, Medication, MD;  Location: [place];  Service: Radiology;  Laterality: N/A;  . TONSILLECTOMY     1953  . VAGINAL HYSTERECTOMY     2001  . VOCAL CORD LATERALIZATION, ENDOSCOPIC APPROACH W/ MLB     2007 (@[facility] )  . WRIST SURGERY     left side/2008  . WRIST SURGERY     right side/ 2002    Allergies: Sulfa antibiotics  Medications: Prior to Admission medications   Medication Sig Start Date End Date Taking? Authorizing Provider  ALPRAZolam Duanne Moron) 0.5 MG tablet Take 0.5 mg by mouth daily as needed for anxiety.    [provider]  aspirin EC 81 MG tablet Take 81 mg by mouth daily.      [provider]  Calcium Carbonate-Vitamin D (CALCIUM 600+D PO) Take 1 tablet by mouth at bedtime.     [provider]  cholecalciferol (VITAMIN D3) 25 MCG (1000 UT) tablet Take 1,000 Units by mouth at bedtime.     [provider]  clopidogrel (PLAVIX) 75 MG tablet Take 75 mg by mouth every evening.    [provider]  DULoxetine (CYMBALTA) 60 MG capsule Take 120 mg by mouth at bedtime.    [provider]  naproxen sodium (ALEVE) 220 MG tablet Take 440 mg by mouth 2 (two) times daily as needed (pain).    [provider]  QUEtiapine (SEROQUEL) 25 MG tablet Take 25 mg by mouth at bedtime.    [provider]  triamcinolone cream (KENALOG) 0.1 % Apply 1 application topically 3 (three) times daily. Apply to the rash on face    [provider]     No family history on file.  Social History   Socioeconomic History  . Marital status: Divorced    Spouse name: Not on file  . Number of children: Not on file  . Years of education: Not on file  . Highest education level: Not on file  Occupational History  . Not on file  Tobacco Use  . Smoking status: Never Smoker  . Smokeless tobacco: Never Used  Substance and Sexual Activity  . Alcohol use: No  . Drug use: Never  . Sexual activity: Not on file  Other Topics Concern  . Not on file  Social History Narrative  . Not on file   Social Determinants of Health   Financial Resource Strain:   . Difficulty of Paying Living Expenses: Not on file  Food Insecurity:   . Worried About Charity fundraiser in the Last Year: Not on file  . Ran Out of Food in the Last Year: Not on file  Transportation Needs:   . Lack of Transportation (Medical): Not on file  . Lack of Transportation (Non-Medical): Not on file  Physical Activity:   . Days of Exercise per Week: Not on file  . Minutes of Exercise per Session: Not on file  Stress:   . Feeling of Stress : Not on file  Social  Connections:   . Frequency of Communication with Friends and Family: Not on file  . Frequency of Social Gatherings with Friends and Family: Not on file  . Attends Religious Services: Not on file  . Active Member of Clubs or Organizations: Not on file  . Attends Archivist Meetings: Not on file  . Marital Status: Not on file     Review of Systems: A 12 point ROS discussed and pertinent positives are indicated in the HPI above.  All other systems are negative.  Review of Systems  Constitutional: Positive for appetite change and fatigue.  HENT: Negative for hearing loss and tinnitus.   Eyes: Negative for visual disturbance.  Respiratory: Positive for shortness of breath. Negative for wheezing.   Cardiovascular: Negative for chest pain and palpitations.  Musculoskeletal: Positive for neck pain.  Neurological: Positive for headaches. Negative for speech difficulty, weakness and numbness.  Psychiatric/Behavioral: Negative for behavioral  problems and confusion.    Physical Exam Constitutional:      General: She is not in acute distress. Pulmonary:     Effort: Pulmonary effort is normal. No respiratory distress.  Musculoskeletal:     Comments: Kyphotic posture noted.  Skin:    General: Skin is warm and dry.  Neurological:     Mental Status: She is oriented to person, place, and time.      Imaging: MR ANGIO HEAD WO CONTRAST  Result Date: 11/17/2019 CLINICAL DATA:  Prior aneurysm treatment. EXAM: MRI HEAD WITHOUT CONTRAST MRA HEAD WITHOUT CONTRAST TECHNIQUE: Multiplanar, multiecho pulse sequences of the brain and surrounding structures were obtained without intravenous contrast. Angiographic images of the head were obtained using MRA technique without contrast. COMPARISON:  And CT October 03, 2018 FINDINGS: MRI HEAD FINDINGS Brain: No acute infarction, hemorrhage, extra-axial collection or mass lesion. Scattered and confluent T2 hyperintensity of the white matter of the  cerebral hemispheres and in the pons, nonspecific, likely related to chronic small vessel ischemia. Prominent supratentorial ventricles disproportional to the prominence of cerebral sulci with mild bowing of the corpus callosum may represent normal pressure hydrocephalus in the appropriate clinical scenario. Vascular: Partially thrombosed aneurysm sac is noted in the right cerebellomedullary cistern measuring 1.7 x 1.1 x 1.1 cm. Differences in measurement from prior CTA may be related to differences in technique. Absent flow void in the V4 segment of the left vertebral artery may represent occlusion versus slow flow. Skull and upper cervical spine: Postsurgical changes from right suboccipital craniectomy. Normal marrow signal elsewhere. Sinuses/Orbits: Mucous retention cyst in the left maxillary sinus. The orbits appear normal. Other: None MRA HEAD FINDINGS The visualized portions of the distal cervical and intracranial internal carotid arteries are widely patent with normal flow related enhancement. The bilateral anterior cerebral and middle cerebral arteries are widely patent with normal flow related enhancement. Distal MCA branches are symmetric bilaterally. No intracranial aneurysm within the anterior circulation. Absent flow related enhancement in most of the V4 segment of the left vertebral artery may be related to occlusion versus slow flow. The right vertebral artery, basilar artery and bilateral posterior cerebral arteries are widely patent and with normal flow related enhancement. Partially thrombosed aneurysmal sac is noted in the right cerebellomedullary angle cistern, originating from the right PICA with flow related enhancement measuring approximately 7 x 6 mm, representing residual aneurysm filling. IMPRESSION: 1. Partially thrombosed aneurysmal sac in the right cerebellomedullary angle cistern measuring 1.7 cm with residual filling measuring 7 mm. Although measurements appears slightly increased when  compared to prior CTA, this may be related to differences in technique. 2. Absent flow void in most of the V4 segment of the left vertebral artery may represent occlusion versus slow flow. 3. Prominent supratentorial ventricles disproportional to the prominence of the sulci with mild bowing of the corpus callosum may represent normal pressure hydrocephalus in the appropriate clinical scenario. 4. Postsurgical changes from right suboccipital craniectomy. Electronically Signed   By: Pedro Earls M.D.   On: 11/17/2019 16:27   MR BRAIN WO CONTRAST  Result Date: 11/17/2019 CLINICAL DATA:  Prior aneurysm treatment. EXAM: MRI HEAD WITHOUT CONTRAST MRA HEAD WITHOUT CONTRAST TECHNIQUE: Multiplanar, multiecho pulse sequences of the brain and surrounding structures were obtained without intravenous contrast. Angiographic images of the head were obtained using MRA technique without contrast. COMPARISON:  And CT October 03, 2018 FINDINGS: MRI HEAD FINDINGS Brain: No acute infarction, hemorrhage, extra-axial collection or mass lesion. Scattered and confluent T2 hyperintensity  of the white matter of the cerebral hemispheres and in the pons, nonspecific, likely related to chronic small vessel ischemia. Prominent supratentorial ventricles disproportional to the prominence of cerebral sulci with mild bowing of the corpus callosum may represent normal pressure hydrocephalus in the appropriate clinical scenario. Vascular: Partially thrombosed aneurysm sac is noted in the right cerebellomedullary cistern measuring 1.7 x 1.1 x 1.1 cm. Differences in measurement from prior CTA may be related to differences in technique. Absent flow void in the V4 segment of the left vertebral artery may represent occlusion versus slow flow. Skull and upper cervical spine: Postsurgical changes from right suboccipital craniectomy. Normal marrow signal elsewhere. Sinuses/Orbits: Mucous retention cyst in the left maxillary sinus. The  orbits appear normal. Other: None MRA HEAD FINDINGS The visualized portions of the distal cervical and intracranial internal carotid arteries are widely patent with normal flow related enhancement. The bilateral anterior cerebral and middle cerebral arteries are widely patent with normal flow related enhancement. Distal MCA branches are symmetric bilaterally. No intracranial aneurysm within the anterior circulation. Absent flow related enhancement in most of the V4 segment of the left vertebral artery may be related to occlusion versus slow flow. The right vertebral artery, basilar artery and bilateral posterior cerebral arteries are widely patent and with normal flow related enhancement. Partially thrombosed aneurysmal sac is noted in the right cerebellomedullary angle cistern, originating from the right PICA with flow related enhancement measuring approximately 7 x 6 mm, representing residual aneurysm filling. IMPRESSION: 1. Partially thrombosed aneurysmal sac in the right cerebellomedullary angle cistern measuring 1.7 cm with residual filling measuring 7 mm. Although measurements appears slightly increased when compared to prior CTA, this may be related to differences in technique. 2. Absent flow void in most of the V4 segment of the left vertebral artery may represent occlusion versus slow flow. 3. Prominent supratentorial ventricles disproportional to the prominence of the sulci with mild bowing of the corpus callosum may represent normal pressure hydrocephalus in the appropriate clinical scenario. 4. Postsurgical changes from right suboccipital craniectomy. Electronically Signed   By: Pedro Earls M.D.   On: 11/17/2019 16:27    Assessment and Plan:  Right PICA aneurysm s/p embolization using 2 telescoping pipeline flow diverter devices 09/05/2018 by Dr. Estanislado Pandy. Dr. Estanislado Pandy was present for consultation.  Discussed patient's overall health. Patient and daughter state that her heath  has going "up and down" over the past year. States that she is more fatigued and has had a loss of appetite (eats small portions). States that she feels "off balance" when ambulating. In addition, becomes dyspneic on exertion. Explained that patient's posture appears kyphotic- could be cause of above issues. Recommend patient begin ambulating with walker to improve posture. Recommend patient follow-up with PCP regarding general health assessment.  Discussed posterior headaches/neck pain. It is unlikely related to aneurysm, as pain appears to be located in the cervical spine. Recommend patient follow-up with PCP regarding posterior headaches/neck pain.  Reviewed MRI/MRA 11/17/2019 and explained that her aneurysm is thrombosed and not completely healed- it is stable at this time. Recommend conservative management at this time, given aneurysm stability and need for patient to assess general health at this time. Plan for follow-up with MRI/MRA brain/head (without contrast) in 6 months. Informed patient that our schedulers will call her to set up this imaging scan.  Discussed DAPT use. Patient states that she has discontinued her DAPT (Plavix and Aspirin) at this time- states "I thought it was temporary". Instructed patient to begin  Aspirin 81 mg once daily.  All questions answered and concerns addressed. Patient conveys understanding and agrees with plan.  Thank you for this interesting consult.  I greatly enjoyed meeting Audyn TAMARIN JENNETT and look forward to participating in their care.  A copy of this report was sent to the requesting provider on this date.  Electronically Signed: Earley Abide, PA-C 11/28/2019, 10:12 AM   I spent a total of 40 Minutes in face to face in clinical consultation, greater than 50% of which was counseling/coordinating care for right PICA aneurysm s/p embolization.

## 2019-12-18 DIAGNOSIS — M8580 Other specified disorders of bone density and structure, unspecified site: Secondary | ICD-10-CM | POA: Diagnosis not present

## 2019-12-26 ENCOUNTER — Other Ambulatory Visit: Payer: Self-pay

## 2019-12-26 ENCOUNTER — Ambulatory Visit (HOSPITAL_COMMUNITY)
Admission: EM | Admit: 2019-12-26 | Discharge: 2019-12-26 | Disposition: A | Discharge status: Home / Self Care | Payer: PPO | Attending: Internal Medicine | Admitting: Internal Medicine

## 2019-12-26 ENCOUNTER — Ambulatory Visit (INDEPENDENT_AMBULATORY_CARE_PROVIDER_SITE_OTHER): Payer: PPO

## 2019-12-26 ENCOUNTER — Encounter (HOSPITAL_COMMUNITY): Payer: Self-pay

## 2019-12-26 DIAGNOSIS — I10 Essential (primary) hypertension: Secondary | ICD-10-CM | POA: Diagnosis not present

## 2019-12-26 DIAGNOSIS — Z7982 Long term (current) use of aspirin: Secondary | ICD-10-CM | POA: Diagnosis not present

## 2019-12-26 DIAGNOSIS — Z20822 Contact with and (suspected) exposure to covid-19: Secondary | ICD-10-CM | POA: Insufficient documentation

## 2019-12-26 DIAGNOSIS — Z79899 Other long term (current) drug therapy: Secondary | ICD-10-CM | POA: Diagnosis not present

## 2019-12-26 DIAGNOSIS — F329 Major depressive disorder, single episode, unspecified: Secondary | ICD-10-CM | POA: Diagnosis not present

## 2019-12-26 DIAGNOSIS — J90 Pleural effusion, not elsewhere classified: Secondary | ICD-10-CM | POA: Diagnosis not present

## 2019-12-26 DIAGNOSIS — Z85828 Personal history of other malignant neoplasm of skin: Secondary | ICD-10-CM | POA: Diagnosis not present

## 2019-12-26 DIAGNOSIS — Z87442 Personal history of urinary calculi: Secondary | ICD-10-CM | POA: Insufficient documentation

## 2019-12-26 DIAGNOSIS — Z882 Allergy status to sulfonamides status: Secondary | ICD-10-CM | POA: Diagnosis not present

## 2019-12-26 DIAGNOSIS — K219 Gastro-esophageal reflux disease without esophagitis: Secondary | ICD-10-CM | POA: Diagnosis not present

## 2019-12-26 DIAGNOSIS — R0602 Shortness of breath: Secondary | ICD-10-CM | POA: Diagnosis not present

## 2019-12-26 LAB — POCT URINALYSIS DIP (DEVICE)
Bilirubin Urine: NEGATIVE
Glucose, UA: NEGATIVE mg/dL
Hgb urine dipstick: NEGATIVE
Ketones, ur: NEGATIVE mg/dL
Nitrite: NEGATIVE
Protein, ur: NEGATIVE mg/dL
Specific Gravity, Urine: 1.01 (ref 1.005–1.030)
Urobilinogen, UA: 0.2 mg/dL (ref 0.0–1.0)
pH: 7 (ref 5.0–8.0)

## 2019-12-26 MED ORDER — FAMOTIDINE 20 MG PO TABS: 20.0000 mg | ORAL_TABLET | Freq: Two times a day (BID) | ORAL | 0 refills | Status: DC

## 2019-12-26 NOTE — ED Provider Notes (Signed)
[place]    CSN: [id number] Arrival date & time: 12/26/19  1055      History   Chief Complaint Chief Complaint  Patient presents with  . Shortness of Breath  . Dizziness    HPI Elaine Rivera is a 74 y.o. female history of brain aneurysms, GERD, hypertension, osteoporosis, presenting today for evaluation of shortness of breath and dizziness.  Patient presents with her daughter.  Concerned about increased shortness of breath with activity of recently.  Daughter remarks that patient typically will lie in bed for a large portion of the day, recently she has had more interest in cooking and doing activities around the house and has been more active than normal.  She denies any associated chest pain.  Patient expresses concern in regards to this as she reports family history of significant heart issues.  She herself denies any history of hypertension, hyperlipidemia, diabetes or tobacco use.  Reports it has been a long time since she has had a stress test.  She has had a mild cough of recently.  Denies any fevers chills or body aches.  Patient also expresses concern over increased acid production.  She has never taken medicine for GERD.  She has intermittent nausea as well as poor appetite.  Daughter reports that she has poor oral intake and nutrition consumption.  Magda Paganini there is concern around patient's dizziness.  This is described as her being off balance.  Often walking with holding walls.  She has been instructed to use assistive devices which she frequently does not use.  Denies any sudden worsening.  Has previously been evaluated for Parkinson disease.  Many of the symptoms have been progressive over years and of recently have become more concerning.  HPI  Past Medical History:  Diagnosis Date  . Anemia    years ago  . Arthritis   . Basal cell carcinoma of eye    biopsy of left eye/ non cancerous  . Complication of anesthesia    nausea/vomiting  . Depression   .  GERD (gastroesophageal reflux disease)   . History of kidney stones   . Hypertension    no longer on medications    Patient Active Problem List   Diagnosis Date Noted  . Herpes 10/04/2018  . Facial cellulitis 10/03/2018  . Brain aneurysm 09/05/2018  . Symptomatic anemia 04/20/2016  . NSAID long-term use 04/20/2016  . Scoliosis 04/20/2016  . Depression 04/20/2016  . HTN (hypertension) 04/20/2016  . GERD (gastroesophageal reflux disease) 04/20/2016  . Occult GI bleeding 04/20/2016    Past Surgical History:  Procedure Laterality Date  . CEREBRAL ANEURYSM REPAIR     1998  . COLONOSCOPY    . ECTOPIC PREGNANCY SURGERY     x2   . ESOPHAGOGASTRODUODENOSCOPY N/A 04/22/2016   Procedure: ESOPHAGOGASTRODUODENOSCOPY (EGD);  Surgeon: Juanita Craver, MD;  Location: [facility];  Service: Endoscopy;  Laterality: N/A;  . IR 3D INDEPENDENT WKST  09/05/2018  . IR ANGIO INTRA EXTRACRAN SEL COM CAROTID INNOMINATE BILAT MOD SED  09/05/2018  . IR ANGIO VERTEBRAL SEL VERTEBRAL UNI R MOD SED  09/05/2018  . IR ANGIOGRAM FOLLOW UP STUDY  09/05/2018  . IR TRANSCATH/EMBOLIZ  09/05/2018  . PARATHYROIDECTOMY     2002  . RADIOLOGY WITH ANESTHESIA N/A 09/05/2018   Procedure: EMBOLIZATION;  Surgeon: Radiologist, Medication, MD;  Location: [facility];  Service: Radiology;  Laterality: N/A;  . TONSILLECTOMY     1953  . VAGINAL HYSTERECTOMY     2001  .  VOCAL CORD LATERALIZATION, ENDOSCOPIC APPROACH W/ MLB     2007 (@[facility] )  . WRIST SURGERY     left side/2008  . WRIST SURGERY     right side/ 2002    OB History   No obstetric history on file.      Home Medications    Prior to Admission medications   Medication Sig Start Date End Date Taking? Authorizing Provider  ALPRAZolam Duanne Moron) 0.5 MG tablet Take 0.5 mg by mouth daily as needed for anxiety.    [provider]  aspirin EC 81 MG tablet Take 81 mg by mouth daily.     [provider]  Calcium Carbonate-Vitamin D (CALCIUM 600+D PO) Take 1  tablet by mouth at bedtime.     [provider]  cholecalciferol (VITAMIN D3) 25 MCG (1000 UT) tablet Take 1,000 Units by mouth at bedtime.     [provider]  clopidogrel (PLAVIX) 75 MG tablet Take 75 mg by mouth every evening.    [provider]  DULoxetine (CYMBALTA) 60 MG capsule Take 120 mg by mouth at bedtime.    [provider]  famotidine (PEPCID) 20 MG tablet Take 1 tablet (20 mg total) by mouth 2 (two) times daily. 12/26/19   Christerpher Clos C, PA-C  naproxen sodium (ALEVE) 220 MG tablet Take 440 mg by mouth 2 (two) times daily as needed (pain).    [provider]  QUEtiapine (SEROQUEL) 25 MG tablet Take 25 mg by mouth at bedtime.    [provider]  triamcinolone cream (KENALOG) 0.1 % Apply 1 application topically 3 (three) times daily. Apply to the rash on face    [provider]    Family History Family History  Family history unknown: Yes    Social History Social History   Tobacco Use  . Smoking status: Never Smoker  . Smokeless tobacco: Never Used  Substance Use Topics  . Alcohol use: No  . Drug use: Never     Allergies   Sulfa antibiotics   Review of Systems Review of Systems  Constitutional: Negative for activity change, appetite change, chills, fatigue and fever.  HENT: Negative for congestion, ear pain, rhinorrhea, sinus pressure, sore throat and trouble swallowing.   Eyes: Negative for discharge and redness.  Respiratory: Positive for cough and shortness of breath. Negative for chest tightness.   Cardiovascular: Negative for chest pain and leg swelling.  Gastrointestinal: Positive for nausea. Negative for abdominal pain, diarrhea and vomiting.  Musculoskeletal: Negative for myalgias.  Skin: Negative for rash.  Neurological: Positive for dizziness. Negative for light-headedness and headaches.     Physical Exam Triage Vital Signs ED Triage Vitals  Enc Vitals Group     BP 12/26/19 1129  120/74     Pulse Rate 12/26/19 1129 87     Resp 12/26/19 1129 16     Temp 12/26/19 1129 98.1 F (36.7 C)     Temp Source 12/26/19 1129 Oral     SpO2 12/26/19 1129 100 %     Weight --      Height --      Head Circumference --      Peak Flow --      Pain Score 12/26/19 1132 0     Pain Loc --      Pain Edu? --      Excl. in [place]? --    No data found.  Updated Vital Signs BP 120/74 (BP Location: Right Arm)   Pulse 87  Temp 98.1 F (36.7 C) (Oral)   Resp 16   SpO2 100%   Visual Acuity Right Eye Distance:   Left Eye Distance:   Bilateral Distance:    Right Eye Near:   Left Eye Near:    Bilateral Near:     Physical Exam Vitals and nursing note reviewed.  Constitutional:      General: She is not in acute distress.    Appearance: She is well-developed.  HENT:     Head: Normocephalic and atraumatic.     Mouth/Throat:     Comments: Oral mucosa pink and moist, no tonsillar enlargement or exudate. Posterior pharynx patent and nonerythematous, no uvula deviation or swelling. Normal phonation. Eyes:     Extraocular Movements: Extraocular movements intact.     Conjunctiva/sclera: Conjunctivae normal.     Pupils: Pupils are equal, round, and reactive to light.  Cardiovascular:     Rate and Rhythm: Normal rate and regular rhythm.     Heart sounds: No murmur.  Pulmonary:     Effort: Pulmonary effort is normal. No respiratory distress.     Breath sounds: Normal breath sounds.     Comments: Breathing comfortably at rest, CTABL, no wheezing, rales or other adventitious sounds auscultated Abdominal:     Palpations: Abdomen is soft.     Tenderness: There is no abdominal tenderness.  Musculoskeletal:     Cervical back: Neck supple.     Comments: Significant scoliosis  Skin:    General: Skin is warm and dry.  Neurological:     General: No focal deficit present.     Mental Status: She is alert and oriented to person, place, and time.     Comments: Patient A&O x3, cranial nerves  II-XII grossly intact, strength at shoulders, hips and knees 5/5, equal bilaterally. Patient is gait slightly unstable but able to ambulate independently, shuffles feet slightly      UC Treatments / Results  Labs (all labs ordered are listed, but only abnormal results are displayed) Labs Reviewed  POCT URINALYSIS DIP (DEVICE) - Abnormal; Notable for the following components:      Result Value   Leukocytes,Ua SMALL (*)    All other components within normal limits  SARS CORONAVIRUS 2 (TAT 6-24 HRS)  URINE CULTURE    EKG   Radiology DG Chest 2 View  Result Date: 12/26/2019 CLINICAL DATA:  Worsening shortness of breath for couple weeks, history hypertension and GERD EXAM: CHEST - 2 VIEW COMPARISON:  10/03/2018 FINDINGS: Significant thoracic deformity secondary to pronounced dextroconvex thoracic scoliosis. Upper normal heart size. Mediastinal contours and pulmonary vascularity normal. Atherosclerotic calcification aorta. Streaky atelectasis LEFT base with associated small LEFT pleural effusion. Lungs mildly hyperinflated but otherwise clear. No infiltrate, RIGHT pleural effusion, or pneumothorax. Bones demineralized. IMPRESSION: Mildly hyperinflated lungs with LEFT basilar atelectasis and small LEFT pleural effusion. Electronically Signed   By: Lavonia Dana M.D.   On: 12/26/2019 12:50    Procedures Procedures (including critical care time)  Medications Ordered in UC Medications - No data to display  Initial Impression / Assessment and Plan / UC Course  I have reviewed the triage vital signs and the nursing notes.  Pertinent labs & imaging results that were available during my care of the patient were reviewed by me and considered in my medical decision making (see chart for details).     Chest x-ray without pneumonia, small left pleural effusion, will monitor this, given size will hold off on adding any Lasix/medicines at this  time.  Urine with small leuks, will send for culture.   EKG normal sinus rhythm, no acute signs of ischemia or infarction, does have some mild flattening of T waves in V2 and V3 compared to prior EKG.  Discussed with patient and daughter given her main concern was cardiac concerns to follow-up with cardiology outpatient stress test.  Otherwise vital signs stable today.  Suspect balance and ataxia likely more chronic versus related to any acute abnormality.  No gross neuro deficits on exam.  Follow-up with PCP/neurology.  Recommended Pepcid to help with any acid.  Discussed strict return precautions. Patient verbalized understanding and is agreeable with plan.  Final Clinical Impressions(s) / UC Diagnoses   Final diagnoses:  Shortness of breath  Gastroesophageal reflux disease, unspecified whether esophagitis present     Discharge Instructions     Chest Xray stable from previous xrays Urine not suggestive of UTI, I will send for culture to confirm COVID swab pending Use walker with walking Follow up with cardiology for further evaluation of heart, possible updated stress test Follow up with primary care for further evaluation of dizziness, shortness of breath If any symptoms significantly worsen please follow up in emergency room   ED Prescriptions    Medication Sig Dispense Auth. Provider   famotidine (PEPCID) 20 MG tablet Take 1 tablet (20 mg total) by mouth 2 (two) times daily. 30 tablet Catera Hankins, [place] C, PA-C     PDMP not reviewed this encounter.   Janith Lima, [place] 12/26/19 1536

## 2019-12-26 NOTE — Discharge Instructions (Addendum)
Chest Xray stable from previous xrays Urine not suggestive of UTI, I will send for culture to confirm COVID swab pending Use walker with walking Follow up with cardiology for further evaluation of heart, possible updated stress test Follow up with primary care for further evaluation of dizziness, shortness of breath If any symptoms significantly worsen please follow up in emergency room

## 2019-12-26 NOTE — ED Triage Notes (Signed)
Pt presents with shortness of breath and dizziness; according to historian patient is not very active but has been moving around at home a little more lately than usual; historian states patient doesn't have the best nutrition while at home.

## 2019-12-27 LAB — URINE CULTURE: Culture: <10000 — AB

## 2019-12-27 LAB — SARS CORONAVIRUS 2 (TAT 6-24 HRS): SARS Coronavirus 2: NEGATIVE

## 2020-01-24 ENCOUNTER — Encounter: Payer: Self-pay | Admitting: Cardiovascular Disease

## 2020-01-24 ENCOUNTER — Ambulatory Visit (INDEPENDENT_AMBULATORY_CARE_PROVIDER_SITE_OTHER): Payer: PPO | Admitting: Cardiovascular Disease

## 2020-01-24 ENCOUNTER — Other Ambulatory Visit: Payer: Self-pay

## 2020-01-24 DIAGNOSIS — R42 Dizziness and giddiness: Secondary | ICD-10-CM | POA: Diagnosis not present

## 2020-01-24 DIAGNOSIS — R0602 Shortness of breath: Secondary | ICD-10-CM | POA: Diagnosis not present

## 2020-01-24 NOTE — Progress Notes (Signed)
01/24/2020 Elaine Rivera   05/04/1946  [id number]  Primary Physician Carol Ada, MD Primary Cardiologist: Lorretta Harp MD FACP, [place], [place], [place]  HPI:  Elaine Rivera is a 74 y.o. thin and frail appearing divorced Caucasian female mother of 1 child referred for evaluation of orthostatic dizziness and dyspnea.  Her primary care provider is Dr. Carol Ada.  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  She has no cardiac risk factors at all.  She is never had a heart tach or stroke.  She does have kyphoscoliosis.  She has had a cerebral aneurysm embolized by Dr. Estanislado Pandy .  She complains of some dizziness when bending down in her kitchen and getting up quickly.  She also complains of increasing dyspnea exertion of the last 1 to 2 years.  There is no history of tobacco abuse.   Current Meds  Medication Sig  . ALPRAZolam (XANAX) 0.5 MG tablet Take 0.5 mg by mouth daily as needed for anxiety.  Marland Kitchen aspirin EC 81 MG tablet Take 81 mg by mouth daily.   . Calcium Carbonate-Vitamin D (CALCIUM 600+D PO) Take 1 tablet by mouth at bedtime.   . cholecalciferol (VITAMIN D3) 25 MCG (1000 UT) tablet Take 1,000 Units by mouth at bedtime.   . clopidogrel (PLAVIX) 75 MG tablet Take 75 mg by mouth every evening.  . DULoxetine (CYMBALTA) 60 MG capsule Take 120 mg by mouth at bedtime.  . famotidine (PEPCID) 20 MG tablet Take 1 tablet (20 mg total) by mouth 2 (two) times daily.  . naproxen sodium (ALEVE) 220 MG tablet Take 440 mg by mouth 2 (two) times daily as needed (pain).  . QUEtiapine (SEROQUEL) 25 MG tablet Take 25 mg by mouth at bedtime.  . triamcinolone cream (KENALOG) 0.1 % Apply 1 application topically 3 (three) times daily. Apply to the rash on face     Allergies  Allergen Reactions  . Sulfa Antibiotics Other (See Comments)    Reaction:  Unknown     Social History   Socioeconomic History  . Marital status: Divorced    Spouse name: Not on file  .  Number of children: Not on file  . Years of education: Not on file  . Highest education level: Not on file  Occupational History  . Not on file  Tobacco Use  . Smoking status: Never Smoker  . Smokeless tobacco: Never Used  Substance and Sexual Activity  . Alcohol use: No  . Drug use: Never  . Sexual activity: Not on file  Other Topics Concern  . Not on file  Social History Narrative  . Not on file   Social Determinants of Health   Financial Resource Strain:   . Difficulty of Paying Living Expenses:   Food Insecurity:   . Worried About Charity fundraiser in the Last Year:   . Arboriculturist in the Last Year:   Transportation Needs:   . Film/video editor (Medical):   Marland Kitchen Lack of Transportation (Non-Medical):   Physical Activity:   . Days of Exercise per Week:   . Minutes of Exercise per Session:   Stress:   . Feeling of Stress :   Social Connections:   . Frequency of Communication with Friends and Family:   . Frequency of Social Gatherings with Friends and Family:   . Attends Religious Services:   . Active Member of Clubs or Organizations:   . Attends Club  or Organization Meetings:   Marland Kitchen Marital Status:   Intimate Partner Violence:   . Fear of Current or Ex-Partner:   . Emotionally Abused:   Marland Kitchen Physically Abused:   . Sexually Abused:      Review of Systems: General: negative for chills, fever, night sweats or weight changes.  Cardiovascular: negative for chest pain, dyspnea on exertion, edema, orthopnea, palpitations, paroxysmal nocturnal dyspnea or shortness of breath Dermatological: negative for rash Respiratory: negative for cough or wheezing Urologic: negative for hematuria Abdominal: negative for nausea, vomiting, diarrhea, bright red blood per rectum, melena, or hematemesis Neurologic: negative for visual changes, syncope, or dizziness All other systems reviewed and are otherwise negative except as noted above.    Blood pressure 124/82, pulse (!) 107,  height 5' (1.524 m), weight 119 lb 12.8 oz (54.3 kg).  General appearance: alert and no distress Neck: no adenopathy, no carotid bruit, no JVD, supple, symmetrical, trachea midline and thyroid not enlarged, symmetric, no tenderness/mass/nodules Lungs: clear to auscultation bilaterally Heart: regular rate and rhythm, S1, S2 normal, no murmur, click, rub or gallop Extremities: extremities normal, atraumatic, no cyanosis or edema Pulses: 2+ and symmetric Skin: Skin color, texture, turgor normal. No rashes or lesions Neurologic: Alert and oriented X 3, normal strength and tone. Normal symmetric reflexes. Normal coordination and gait  EKG sinus tachycardia at 107 with left anterior fascicular block.  I personally reviewed this EKG.  ASSESSMENT AND PLAN:   Dizziness Elaine Rivera complains of some orthostatic dizziness particular when she is in the kitchen and she bends over the still.  When she gets up she feels somewhat dizzy.  I told her to get up from bending the more slowly.  Shortness of breath Elaine Rivera complains of increasing dyspnea on exertion over the last year or so.  She has no history of tobacco abuse.  She does have kyphoscoliosis and there may be an element of restrictive lung disease.  Going to get a 2D echo to further evaluate of this is normal I recommended that she undergo formal pulmonary evaluation.      Lorretta Harp MD FACP,FACC,FAHA, [facility] 01/24/2020 2:20 PM

## 2020-01-24 NOTE — Assessment & Plan Note (Signed)
Ms. Schodowski complains of increasing dyspnea on exertion over the last year or so.  She has no history of tobacco abuse.  She does have kyphoscoliosis and there may be an element of restrictive lung disease.  Going to get a 2D echo to further evaluate of this is normal I recommended that she undergo formal pulmonary evaluation.

## 2020-01-24 NOTE — Assessment & Plan Note (Signed)
Elaine Rivera complains of some orthostatic dizziness particular when she is in the kitchen and she bends over the still.  When she gets up she feels somewhat dizzy.  I told her to get up from bending the more slowly.

## 2020-01-24 NOTE — Patient Instructions (Signed)
Medication Instructions:  The current medical regimen is effective;  continue present plan and medications.  *If you need a refill on your cardiac medications before your next appointment, please call your pharmacy*   Lab Work: The current medical regimen is effective;  continue present plan and medications.  If you have labs (blood work) drawn today and your tests are completely normal, you will receive your results only by: Marland Kitchen MyChart Message (if you have MyChart) OR . A paper copy in the mail If you have any lab test that is abnormal or we need to change your treatment, we will call you to review the results.   Testing/Procedures: Echocardiogram - Your physician has requested that you have an echocardiogram. Echocardiography is a painless test that uses sound waves to create images of your heart. It provides your doctor with information about the size and shape of your heart and how well your heart's chambers and valves are working. This procedure takes approximately one hour. There are no restrictions for this procedure. This will be performed at our [facility] location - [address], [address].    Follow-Up: At [facility], you and your health needs are our priority.  As part of our continuing mission to provide you with exceptional heart care, we have created designated Provider Care Teams.  These Care Teams include your primary Cardiologist (physician) and Advanced Practice Providers (APPs -  Physician Assistants and Nurse Practitioners) who all work together to provide you with the care you need, when you need it.  We recommend signing up for the patient portal called "MyChart".  Sign up information is provided on this After Visit Summary.  MyChart is used to connect with patients for Virtual Visits (Telemedicine).  Patients are able to view lab/test results, encounter notes, upcoming appointments, etc.  Non-urgent messages can be sent to your provider as well.   To learn more  about what you can do with MyChart, go to [email or web address].    Your next appointment:   As needed  The format for your next appointment:   In Person  Provider:   Quay Burow, MD

## 2020-02-12 ENCOUNTER — Other Ambulatory Visit (HOSPITAL_COMMUNITY): Payer: PPO

## 2020-02-28 ENCOUNTER — Other Ambulatory Visit: Payer: Self-pay

## 2020-02-28 ENCOUNTER — Inpatient Hospital Stay (HOSPITAL_COMMUNITY)
Admission: EM | Admit: 2020-02-28 | Discharge: 2020-03-01 | DRG: 378 | Disposition: A | Discharge status: Home / Self Care | Payer: PPO | Attending: Internal Medicine | Admitting: Internal Medicine

## 2020-02-28 ENCOUNTER — Encounter (HOSPITAL_COMMUNITY): Payer: Self-pay | Admitting: Internal Medicine

## 2020-02-28 ENCOUNTER — Emergency Department (HOSPITAL_COMMUNITY): Payer: PPO

## 2020-02-28 DIAGNOSIS — R05 Cough: Secondary | ICD-10-CM | POA: Diagnosis present

## 2020-02-28 DIAGNOSIS — I10 Essential (primary) hypertension: Secondary | ICD-10-CM | POA: Diagnosis present

## 2020-02-28 DIAGNOSIS — E559 Vitamin D deficiency, unspecified: Secondary | ICD-10-CM | POA: Diagnosis not present

## 2020-02-28 DIAGNOSIS — Z87442 Personal history of urinary calculi: Secondary | ICD-10-CM

## 2020-02-28 DIAGNOSIS — E890 Postprocedural hypothyroidism: Secondary | ICD-10-CM | POA: Diagnosis present

## 2020-02-28 DIAGNOSIS — Z66 Do not resuscitate: Secondary | ICD-10-CM | POA: Diagnosis present

## 2020-02-28 DIAGNOSIS — R0602 Shortness of breath: Secondary | ICD-10-CM

## 2020-02-28 DIAGNOSIS — D509 Iron deficiency anemia, unspecified: Secondary | ICD-10-CM | POA: Diagnosis present

## 2020-02-28 DIAGNOSIS — Z85840 Personal history of malignant neoplasm of eye: Secondary | ICD-10-CM | POA: Diagnosis not present

## 2020-02-28 DIAGNOSIS — K449 Diaphragmatic hernia without obstruction or gangrene: Secondary | ICD-10-CM | POA: Diagnosis present

## 2020-02-28 DIAGNOSIS — K922 Gastrointestinal hemorrhage, unspecified: Principal | ICD-10-CM | POA: Diagnosis present

## 2020-02-28 DIAGNOSIS — R195 Other fecal abnormalities: Secondary | ICD-10-CM | POA: Diagnosis present

## 2020-02-28 DIAGNOSIS — I340 Nonrheumatic mitral (valve) insufficiency: Secondary | ICD-10-CM | POA: Diagnosis not present

## 2020-02-28 DIAGNOSIS — Z7902 Long term (current) use of antithrombotics/antiplatelets: Secondary | ICD-10-CM | POA: Diagnosis not present

## 2020-02-28 DIAGNOSIS — E876 Hypokalemia: Secondary | ICD-10-CM | POA: Diagnosis not present

## 2020-02-28 DIAGNOSIS — K219 Gastro-esophageal reflux disease without esophagitis: Secondary | ICD-10-CM | POA: Diagnosis present

## 2020-02-28 DIAGNOSIS — D62 Acute posthemorrhagic anemia: Secondary | ICD-10-CM | POA: Diagnosis present

## 2020-02-28 DIAGNOSIS — Z882 Allergy status to sulfonamides status: Secondary | ICD-10-CM | POA: Diagnosis not present

## 2020-02-28 DIAGNOSIS — Z79899 Other long term (current) drug therapy: Secondary | ICD-10-CM

## 2020-02-28 DIAGNOSIS — Z7982 Long term (current) use of aspirin: Secondary | ICD-10-CM | POA: Diagnosis not present

## 2020-02-28 DIAGNOSIS — Z86010 Personal history of colonic polyps: Secondary | ICD-10-CM | POA: Diagnosis not present

## 2020-02-28 DIAGNOSIS — R7989 Other specified abnormal findings of blood chemistry: Secondary | ICD-10-CM | POA: Diagnosis not present

## 2020-02-28 DIAGNOSIS — E782 Mixed hyperlipidemia: Secondary | ICD-10-CM | POA: Diagnosis not present

## 2020-02-28 DIAGNOSIS — F32A Depression, unspecified: Secondary | ICD-10-CM | POA: Diagnosis present

## 2020-02-28 DIAGNOSIS — R42 Dizziness and giddiness: Secondary | ICD-10-CM | POA: Diagnosis not present

## 2020-02-28 DIAGNOSIS — R5383 Other fatigue: Secondary | ICD-10-CM

## 2020-02-28 DIAGNOSIS — Z20822 Contact with and (suspected) exposure to covid-19: Secondary | ICD-10-CM | POA: Diagnosis present

## 2020-02-28 DIAGNOSIS — D649 Anemia, unspecified: Secondary | ICD-10-CM | POA: Diagnosis present

## 2020-02-28 DIAGNOSIS — F329 Major depressive disorder, single episode, unspecified: Secondary | ICD-10-CM | POA: Diagnosis present

## 2020-02-28 DIAGNOSIS — R000 Tachycardia, unspecified: Secondary | ICD-10-CM | POA: Diagnosis not present

## 2020-02-28 DIAGNOSIS — K311 Adult hypertrophic pyloric stenosis: Secondary | ICD-10-CM | POA: Diagnosis not present

## 2020-02-28 DIAGNOSIS — R058 Other specified cough: Secondary | ICD-10-CM

## 2020-02-28 DIAGNOSIS — Z85828 Personal history of other malignant neoplasm of skin: Secondary | ICD-10-CM

## 2020-02-28 DIAGNOSIS — F419 Anxiety disorder, unspecified: Secondary | ICD-10-CM | POA: Diagnosis not present

## 2020-02-28 LAB — CBC WITH DIFFERENTIAL/PLATELET
Abs Immature Granulocytes: 0.02 10*3/uL (ref 0.00–0.07)
Basophils Absolute: 0 10*3/uL (ref 0.0–0.1)
Basophils Relative: 0 %
Eosinophils Absolute: 0 10*3/uL (ref 0.0–0.5)
Eosinophils Relative: 0 %
HCT: 23.8 % — ABNORMAL LOW (ref 36.0–46.0)
Hemoglobin: 6.2 g/dL — CL (ref 12.0–15.0)
Immature Granulocytes: 0 %
Lymphocytes Relative: 10 %
Lymphs Abs: 0.8 10*3/uL (ref 0.7–4.0)
MCH: 18.3 pg — ABNORMAL LOW (ref 26.0–34.0)
MCHC: 26.1 g/dL — ABNORMAL LOW (ref 30.0–36.0)
MCV: 70.4 fL — ABNORMAL LOW (ref 80.0–100.0)
Monocytes Absolute: 0.7 10*3/uL (ref 0.1–1.0)
Monocytes Relative: 9 %
Neutro Abs: 6.1 10*3/uL (ref 1.7–7.7)
Neutrophils Relative %: 81 %
Platelets: 437 10*3/uL — ABNORMAL HIGH (ref 150–400)
RBC: 3.38 MIL/uL — ABNORMAL LOW (ref 3.87–5.11)
RDW: 17.6 % — ABNORMAL HIGH (ref 11.5–15.5)
WBC: 7.6 10*3/uL (ref 4.0–10.5)
nRBC: 0 % (ref 0.0–0.2)

## 2020-02-28 LAB — URINALYSIS, ROUTINE W REFLEX MICROSCOPIC
Bilirubin Urine: NEGATIVE
Glucose, UA: NEGATIVE mg/dL
Hgb urine dipstick: NEGATIVE
Ketones, ur: NEGATIVE mg/dL
Nitrite: POSITIVE — AB
Protein, ur: NEGATIVE mg/dL
Specific Gravity, Urine: 1.011 (ref 1.005–1.030)
pH: 7 (ref 5.0–8.0)

## 2020-02-28 LAB — TROPONIN I (HIGH SENSITIVITY)
Troponin I (High Sensitivity): 4 ng/L (ref <18)
Troponin I (High Sensitivity): 4 ng/L (ref <18)

## 2020-02-28 LAB — COMPREHENSIVE METABOLIC PANEL
ALT: 16 U/L (ref 0–44)
AST: 17 U/L (ref 15–41)
Albumin: 3.7 g/dL (ref 3.5–5.0)
Alkaline Phosphatase: 67 U/L (ref 38–126)
Anion gap: 12 (ref 5–15)
BUN: 15 mg/dL (ref 8–23)
CO2: 25 mmol/L (ref 22–32)
Calcium: 9.4 mg/dL (ref 8.9–10.3)
Chloride: 102 mmol/L (ref 98–111)
Creatinine, Ser: 0.76 mg/dL (ref 0.44–1.00)
GFR calc Af Amer: >60 mL/min (ref >60)
GFR calc non Af Amer: >60 mL/min (ref >60)
Glucose, Bld: 121 mg/dL — ABNORMAL HIGH (ref 70–99)
Potassium: 3.3 mmol/L — ABNORMAL LOW (ref 3.5–5.1)
Sodium: 139 mmol/L (ref 135–145)
Total Bilirubin: 0.6 mg/dL (ref 0.3–1.2)
Total Protein: 7 g/dL (ref 6.5–8.1)

## 2020-02-28 LAB — TSH: TSH: 4.933 u[IU]/mL — ABNORMAL HIGH (ref 0.350–4.500)

## 2020-02-28 LAB — LACTIC ACID, PLASMA
Lactic Acid, Venous: 2 mmol/L (ref 0.5–1.9)
Lactic Acid, Venous: 1.5 mmol/L (ref 0.5–1.9)

## 2020-02-28 LAB — BRAIN NATRIURETIC PEPTIDE: B Natriuretic Peptide: 53.3 pg/mL (ref 0.0–100.0)

## 2020-02-28 LAB — PREPARE RBC (CROSSMATCH)

## 2020-02-28 LAB — POC OCCULT BLOOD, ED: Fecal Occult Bld: NEGATIVE

## 2020-02-28 LAB — SARS CORONAVIRUS 2 BY RT PCR (HOSPITAL ORDER, PERFORMED IN ~~LOC~~ HOSPITAL LAB): SARS Coronavirus 2: NEGATIVE

## 2020-02-28 LAB — D-DIMER, QUANTITATIVE: D-Dimer, Quant: 0.39 ug/mL-FEU (ref 0.00–0.50)

## 2020-02-28 MED ORDER — SODIUM CHLORIDE 0.9 % IV SOLN
10.0000 mL/h | Freq: Once | INTRAVENOUS | Status: AC
  Administered 2020-02-28: 10 mL/h via INTRAVENOUS

## 2020-02-28 MED ORDER — ONDANSETRON HCL 4 MG/2ML IJ SOLN
4.0000 mg | Freq: Four times a day (QID) | INTRAMUSCULAR | Status: DC | PRN
  Administered 2020-02-29: 4 mg via INTRAVENOUS
  Filled 2020-02-28: qty 2

## 2020-02-28 MED ORDER — ONDANSETRON HCL 4 MG PO TABS: 4.0000 mg | ORAL_TABLET | Freq: Four times a day (QID) | ORAL | Status: DC | PRN

## 2020-02-28 MED ORDER — ACETAMINOPHEN 325 MG PO TABS: 650.0000 mg | ORAL_TABLET | Freq: Four times a day (QID) | ORAL | Status: DC | PRN

## 2020-02-28 MED ORDER — ALPRAZOLAM 0.5 MG PO TABS: 0.5000 mg | ORAL_TABLET | Freq: Every day | ORAL | Status: DC | PRN

## 2020-02-28 MED ORDER — PANTOPRAZOLE SODIUM 40 MG IV SOLR
40.0000 mg | Freq: Two times a day (BID) | INTRAVENOUS | Status: DC
  Administered 2020-02-28 – 2020-03-01 (×4): 40 mg via INTRAVENOUS
  Filled 2020-02-28 (×4): qty 40

## 2020-02-28 MED ORDER — ACETAMINOPHEN 650 MG RE SUPP: 650.0000 mg | Freq: Four times a day (QID) | RECTAL | Status: DC | PRN

## 2020-02-28 MED ORDER — DULOXETINE HCL 30 MG PO CPEP
120.0000 mg | ORAL_CAPSULE | Freq: Every day | ORAL | Status: DC
  Administered 2020-02-28 – 2020-02-29 (×2): 120 mg via ORAL
  Filled 2020-02-28 (×2): qty 4

## 2020-02-28 MED ORDER — QUETIAPINE FUMARATE 25 MG PO TABS
25.0000 mg | ORAL_TABLET | Freq: Every day | ORAL | Status: DC
  Administered 2020-02-28 – 2020-02-29 (×2): 25 mg via ORAL
  Filled 2020-02-28 (×2): qty 1

## 2020-02-28 NOTE — Progress Notes (Signed)
RN received report form Hoyle Sauer at [phone]. Patient was transferred to [place] at 29. Patient is alert and oriented x 4. Blood is transfusing the 1st unit. No reaction symptoms. Vital signs was taken. Consent is signed. Room is set up and call light is within patient's reach.

## 2020-02-28 NOTE — ED Provider Notes (Signed)
[facility] DEPT Provider Note   CSN: [id number] Arrival date & time: 02/28/20  1504     History Chief Complaint  Patient presents with   Shortness of Elaine Rivera is a 74 y.o. female.  The history is provided by the patient, medical records and a relative. No language interpreter was used.  Shortness of Breath Severity:  Moderate Onset quality:  Gradual Duration:  2 weeks Timing:  Constant Progression:  Worsening Chronicity:  New Context: not URI   Relieved by:  Nothing Worsened by:  Exertion Ineffective treatments:  None tried Associated symptoms: cough   Associated symptoms: no abdominal pain, no chest pain, no diaphoresis, no fever, no headaches, no hemoptysis, no neck pain, no rash, no sore throat, no sputum production, no swollen glands, no vomiting and no wheezing        Past Medical History:  Diagnosis Date   Anemia    years ago   Arthritis    Basal cell carcinoma of eye    biopsy of left eye/ non cancerous   Complication of anesthesia    nausea/vomiting   Depression    GERD (gastroesophageal reflux disease)    History of kidney stones    Hypertension    no longer on medications    Patient Active Problem List   Diagnosis Date Noted   Shortness of breath 01/24/2020   Dizziness 01/24/2020   Herpes 10/04/2018   Facial cellulitis 10/03/2018   Brain aneurysm 09/05/2018   Symptomatic anemia 04/20/2016   NSAID long-term use 04/20/2016   Scoliosis 04/20/2016   Depression 04/20/2016   HTN (hypertension) 04/20/2016   GERD (gastroesophageal reflux disease) 04/20/2016   Occult GI bleeding 04/20/2016    Past Surgical History:  Procedure Laterality Date   CEREBRAL ANEURYSM REPAIR     1998   COLONOSCOPY     ECTOPIC PREGNANCY SURGERY     x2    ESOPHAGOGASTRODUODENOSCOPY N/A 04/22/2016   Procedure: ESOPHAGOGASTRODUODENOSCOPY (EGD);  Surgeon: Juanita Craver, MD;  Location: [facility];   Service: Endoscopy;  Laterality: N/A;   IR 3D INDEPENDENT WKST  09/05/2018   IR ANGIO INTRA EXTRACRAN SEL COM CAROTID INNOMINATE BILAT MOD SED  09/05/2018   IR ANGIO VERTEBRAL SEL VERTEBRAL UNI R MOD SED  09/05/2018   IR ANGIOGRAM FOLLOW UP STUDY  09/05/2018   IR TRANSCATH/EMBOLIZ  09/05/2018   PARATHYROIDECTOMY     2002   RADIOLOGY WITH ANESTHESIA N/A 09/05/2018   Procedure: EMBOLIZATION;  Surgeon: Radiologist, Medication, MD;  Location: [facility];  Service: Radiology;  Laterality: N/A;   TONSILLECTOMY     1953   VAGINAL HYSTERECTOMY     2001   VOCAL CORD LATERALIZATION, ENDOSCOPIC APPROACH W/ MLB     2007 (@[facility] )   WRIST SURGERY     left side/2008   WRIST SURGERY     right side/ 2002     OB History   No obstetric history on file.     Family History  Family history unknown: Yes    Social History   Tobacco Use   Smoking status: Never Smoker   Smokeless tobacco: Never Used  Substance Use Topics   Alcohol use: No   Drug use: Never    Home Medications Prior to Admission medications   Medication Sig Start Date End Date Taking? Authorizing Provider  ALPRAZolam Duanne Moron) 0.5 MG tablet Take 0.5 mg by mouth daily as needed for anxiety.    [provider]  aspirin EC  81 MG tablet Take 81 mg by mouth daily.     [provider]  Calcium Carbonate-Vitamin D (CALCIUM 600+D PO) Take 1 tablet by mouth at bedtime.     [provider]  cholecalciferol (VITAMIN D3) 25 MCG (1000 UT) tablet Take 1,000 Units by mouth at bedtime.     [provider]  clopidogrel (PLAVIX) 75 MG tablet Take 75 mg by mouth every evening.    [provider]  DULoxetine (CYMBALTA) 60 MG capsule Take 120 mg by mouth at bedtime.    [provider]  famotidine (PEPCID) 20 MG tablet Take 1 tablet (20 mg total) by mouth 2 (two) times daily. 12/26/19   Wieters, Hallie C, PA-C  naproxen sodium (ALEVE) 220 MG tablet Take 440 mg by mouth 2 (two) times daily as  needed (pain).    [provider]  QUEtiapine (SEROQUEL) 25 MG tablet Take 25 mg by mouth at bedtime.    [provider]  triamcinolone cream (KENALOG) 0.1 % Apply 1 application topically 3 (three) times daily. Apply to the rash on face    [provider]    Allergies    Sulfa antibiotics  Review of Systems   Review of Systems  Constitutional: Positive for fatigue. Negative for chills, diaphoresis and fever.  HENT: Negative for congestion and sore throat.   Respiratory: Positive for cough and shortness of breath. Negative for hemoptysis, sputum production, chest tightness, wheezing and stridor.   Cardiovascular: Negative for chest pain, palpitations and leg swelling.  Gastrointestinal: Negative for abdominal pain, constipation, diarrhea, nausea and vomiting.  Genitourinary: Negative for dysuria and flank pain.  Musculoskeletal: Negative for back pain and neck pain.  Skin: Negative for rash and wound.  Neurological: Positive for light-headedness. Negative for dizziness, syncope and headaches.  Psychiatric/Behavioral: Negative for agitation and confusion.  All other systems reviewed and are negative.   Physical Exam Updated Vital Signs BP 111/73 (BP Location: Right Arm)    Pulse (!) 144    Temp 98.2 F (36.8 C) (Oral)    Resp (!) 22    SpO2 98%   Physical Exam Vitals and nursing note reviewed.  Constitutional:      General: She is not in acute distress.    Appearance: She is well-developed. She is not ill-appearing, toxic-appearing or diaphoretic.  HENT:     Head: Normocephalic and atraumatic.     Nose: No congestion or rhinorrhea.     Mouth/Throat:     Mouth: Mucous membranes are dry.     Pharynx: No oropharyngeal exudate or posterior oropharyngeal erythema.  Eyes:     Conjunctiva/sclera: Conjunctivae normal.     Pupils: Pupils are equal, round, and reactive to light.  Cardiovascular:     Rate and Rhythm: Regular rhythm. Tachycardia present.      Pulses: Normal pulses.     Heart sounds: No murmur.  Pulmonary:     Effort: Pulmonary effort is normal. No respiratory distress.     Breath sounds: Normal breath sounds. No wheezing, rhonchi or rales.  Chest:     Chest wall: No tenderness.  Abdominal:     General: Abdomen is flat.     Palpations: Abdomen is soft.     Tenderness: There is no abdominal tenderness. There is no right CVA tenderness, left CVA tenderness, guarding or rebound.  Musculoskeletal:     Cervical back: Neck supple. No tenderness.  Skin:    General: Skin is warm and dry.  Capillary Refill: Capillary refill takes less than 2 seconds.     Coloration: Skin is pale.     Findings: No erythema or rash.  Neurological:     Mental Status: She is alert and oriented to person, place, and time.     Sensory: No sensory deficit.     Motor: No weakness.  Psychiatric:        Mood and Affect: Mood normal.     ED Results / Procedures / Treatments   Labs (all labs ordered are listed, but only abnormal results are displayed) Labs Reviewed  CBC WITH DIFFERENTIAL/PLATELET - Abnormal; Notable for the following components:      Result Value   RBC 3.38 (*)    Hemoglobin 6.2 (*)    HCT 23.8 (*)    MCV 70.4 (*)    MCH 18.3 (*)    MCHC 26.1 (*)    RDW 17.6 (*)    Platelets 437 (*)    All other components within normal limits  COMPREHENSIVE METABOLIC PANEL - Abnormal; Notable for the following components:   Potassium 3.3 (*)    Glucose, Bld 121 (*)    All other components within normal limits  TSH - Abnormal; Notable for the following components:   TSH 4.933 (*)    All other components within normal limits  LACTIC ACID, PLASMA - Abnormal; Notable for the following components:   Lactic Acid, Venous 2.0 (*)    All other components within normal limits  URINE CULTURE  SARS CORONAVIRUS 2 BY RT PCR (HOSPITAL ORDER, [facility])  BRAIN NATRIURETIC PEPTIDE  D-DIMER, QUANTITATIVE (NOT AT [facility])    URINALYSIS, ROUTINE W REFLEX MICROSCOPIC  LACTIC ACID, PLASMA  POC OCCULT BLOOD, ED  TYPE AND SCREEN  PREPARE RBC (CROSSMATCH)  TROPONIN I (HIGH SENSITIVITY)    EKG EKG Interpretation  Date/Time:  Wednesday February 28 2020 15:52:08 EDT Ventricular Rate:  143 PR Interval:    QRS Duration: 90 QT Interval:  304 QTC Calculation: 469 R Axis:   -10 Text Interpretation: Sinus or ectopic atrial tachycardia Abnormal R-wave progression, late transition Repolarization abnormality, prob rate related 12 Lead; [place]-L[place]ar When compared to prior, faster rate. No STEMI Confirmed by Antony Blackbird [phone]) on 02/28/2020 4:06:11 PM   Radiology No results found.  Procedures Procedures (including critical care time)  CRITICAL CARE Performed by: Gwenyth Allegra Tykwon Fera Total critical care time: 35 minutes Critical care time was exclusive of separately billable procedures and treating other patients. Critical care was necessary to treat or prevent imminent or life-threatening deterioration. Critical care was time spent personally by me on the following activities: development of treatment plan with patient and/or surrogate as well as nursing, discussions with consultants, evaluation of patient's response to treatment, examination of patient, obtaining history from patient or surrogate, ordering and performing treatments and interventions, ordering and review of laboratory studies, ordering and review of radiographic studies, pulse oximetry and re-evaluation of patient's condition.   Medications Ordered in ED Medications  0.9 %  sodium chloride infusion (has no administration in time range)    ED Course  I have reviewed the triage vital signs and the nursing notes.  Pertinent labs & imaging results that were available during my care of the patient were reviewed by me and considered in my medical decision making (see chart for details).    MDM Rules/Calculators/A&P  Ellamarie EATHER POEPPING is a 74 y.o. female with a past medical history significant for GERD, hypertension, prior cerebral aneurysm status post coiling, prior occult GI bleeding requiring blood transfusion, hysterectomy, cholecystectomy, and arthritis who presents with exertional shortness of breath for the last week, pallor, lightheadedness, and fatigue.  Patient reports that she was told by her PCP to come the emergency department for likely symptomatic anemia today.  Patient says that she has had extreme shortness breath the last week but denies any chest pain or palpitations.  She denies fevers, chills, or congestion.  She does report some dry cough with no hemoptysis.  She denies leg pain or leg swelling.  She denies any constipation, diarrhea, dark tarry stool, or any urinary symptoms.  She does denies any swelling in her legs.  She does feel lightheaded.  On exam, patient was found to be tachycardic and tachypneic initially.  Heart rate was in the 140s on arrival but while laying down did improve to around 100.  When I set her up to listen to her lungs, jumped again to 120.  Patient felt symptomatic and lightheaded at that time.  Patient does appear pale.  Lungs otherwise were clear and chest was nontender.  Slight murmur appreciated.  Abdomen nontender.  Good pulses in all extremities.  Patient resting comfortably.  EKG showed sinus tachycardia and on telemetry that showed sinus rhythm.  No STEMI.  Fecal occult blood test was negative.  Will get laboratory testing and work-up for the patient's exertional shortness of breath.  As she is having shortness of breath, tachycardia and tachypnea, will get D-dimer.  A Covid test and type and screen was also sent.  Anticipate reassessment after work-up to determine disposition however if she is still symptomatic and tachycardic, patient may require admission.  Patient's hemoglobin dropped to 6.2.  Although her fecal occult is negative, unclear etiology of the anemia.  She  will be given 2 units of blood due to symptomatic anemia with severe fatigue and exertional shortness of breath.  Will admit for further management.   Final Clinical Impression(s) / ED Diagnoses Final diagnoses:  Exertional shortness of breath  Fatigue, unspecified type  Dry cough  Symptomatic anemia     Clinical Impression: 1. Exertional shortness of breath   2. Fatigue, unspecified type   3. Dry cough   4. Symptomatic anemia     Disposition: Care transferred to oncoming team while waiting for diagnostic work-up results.  Anticipate disposition based on these findings.  This note was prepared with assistance of Systems analyst. Occasional wrong-word or sound-a-like substitutions may have occurred due to the inherent limitations of voice recognition software.     Kylii Ennis, Gwenyth Allegra, MD 02/28/20 1739

## 2020-02-28 NOTE — ED Notes (Signed)
Patient transported to X-ray 

## 2020-02-28 NOTE — Progress Notes (Signed)
Call daughter to update at 2100.

## 2020-02-28 NOTE — ED Notes (Signed)
Written consent obtained and witnessed by this RN prior to blood administration.

## 2020-02-28 NOTE — H&P (Signed)
History and Physical    Elaine Rivera [id number] DOB: 1946-03-09 DOA: 02/28/2020  PCP: Carol Ada, MD   Patient coming from: Home  I have personally briefly reviewed patient's old medical records in [place]  Chief Complaint: Shortness of breath  HPI: Elaine Rivera is a 74 y.o. female with medical history significant of hypertension, depression, GERD, prior upper GI bleeding requiring blood transfusion, prior cerebral aneurysm status post coiling, arthritis presented with progressively worsening shortness of breath for the last week.  She states that she has been feeling progressively short of breath with mild to moderate exertion along with lightheadedness and fatigue.  She denies any cough, fever, nausea, vomiting, abdominal pain, diarrhea or dysuria.  She denies chest pain is well.  She has had some dark stools over the last 1 to 2 weeks.  Denies any swelling of her legs.  ED Course: She was found to have hemoglobin of 6.2.  Her last hemoglobin was 11.3 on 10/04/2018.  She was initially tachycardic on presentation.  Fecal occult blood test was negative.  Packed red cell transfusion was ordered by ER provider.  Hospitalist service was asked to evaluate the patient.  I then asked Dr. Gilford Raid to speak to Dr. Hoyt Koch who has seen the patient in the past.  Review of Systems: As per HPI otherwise all other systems were reviewed and are negative.   Past Medical History:  Diagnosis Date  . Anemia    years ago  . Arthritis   . Basal cell carcinoma of eye    biopsy of left eye/ non cancerous  . Complication of anesthesia    nausea/vomiting  . Depression   . GERD (gastroesophageal reflux disease)   . History of kidney stones   . Hypertension    no longer on medications    Past Surgical History:  Procedure Laterality Date  . CEREBRAL ANEURYSM REPAIR     1998  . COLONOSCOPY    . ECTOPIC PREGNANCY SURGERY     x2   . ESOPHAGOGASTRODUODENOSCOPY N/A 04/22/2016   Procedure: ESOPHAGOGASTRODUODENOSCOPY (EGD);  Surgeon: Juanita Craver, MD;  Location: [facility];  Service: Endoscopy;  Laterality: N/A;  . IR 3D INDEPENDENT WKST  09/05/2018  . IR ANGIO INTRA EXTRACRAN SEL COM CAROTID INNOMINATE BILAT MOD SED  09/05/2018  . IR ANGIO VERTEBRAL SEL VERTEBRAL UNI R MOD SED  09/05/2018  . IR ANGIOGRAM FOLLOW UP STUDY  09/05/2018  . IR TRANSCATH/EMBOLIZ  09/05/2018  . PARATHYROIDECTOMY     2002  . RADIOLOGY WITH ANESTHESIA N/A 09/05/2018   Procedure: EMBOLIZATION;  Surgeon: Radiologist, Medication, MD;  Location: [facility];  Service: Radiology;  Laterality: N/A;  . TONSILLECTOMY     1953  . VAGINAL HYSTERECTOMY     2001  . VOCAL CORD LATERALIZATION, ENDOSCOPIC APPROACH W/ MLB     2007 (@[facility] )  . WRIST SURGERY     left side/2008  . WRIST SURGERY     right side/ 2002     reports that she has never smoked. She has never used smokeless tobacco. She reports that she does not drink alcohol or use drugs.  Allergies  Allergen Reactions  . Sulfa Antibiotics Other (See Comments)    Reaction:  Unknown    Family history: No history of cancer or TB  Prior to Admission medications   Medication Sig Start Date End Date Taking? Authorizing Provider  ALPRAZolam Duanne Moron) 0.5 MG tablet Take 0.5 mg by mouth daily as needed for anxiety.  [provider]  aspirin EC 81 MG tablet Take 81 mg by mouth daily.     [provider]  Calcium Carbonate-Vitamin D (CALCIUM 600+D PO) Take 1 tablet by mouth at bedtime.     [provider]  cholecalciferol (VITAMIN D3) 25 MCG (1000 UT) tablet Take 1,000 Units by mouth at bedtime.     [provider]  clopidogrel (PLAVIX) 75 MG tablet Take 75 mg by mouth every evening.    [provider]  DULoxetine (CYMBALTA) 60 MG capsule Take 120 mg by mouth at bedtime.    [provider]  famotidine (PEPCID) 20 MG tablet Take 1 tablet (20 mg total) by mouth 2 (two) times daily. 12/26/19   Wieters, Hallie  C, PA-C  naproxen sodium (ALEVE) 220 MG tablet Take 440 mg by mouth 2 (two) times daily as needed (pain).    [provider]  QUEtiapine (SEROQUEL) 25 MG tablet Take 25 mg by mouth at bedtime.    [provider]  triamcinolone cream (KENALOG) 0.1 % Apply 1 application topically 3 (three) times daily. Apply to the rash on face    [provider]    Physical Exam: Vitals:   02/28/20 1544 02/28/20 1651  BP: 111/73   Pulse: (!) 144   Resp: (!) 22   Temp: 98.2 F (36.8 C)   TempSrc: Oral   SpO2: 98% 98%    Constitutional: NAD, calm, comfortable Vitals:   02/28/20 1544 02/28/20 1651  BP: 111/73   Pulse: (!) 144   Resp: (!) 22   Temp: 98.2 F (36.8 C)   TempSrc: Oral   SpO2: 98% 98%   Eyes: PERRL, lids and conjunctivae normal.  She looks pale.  No icterus. ENMT: Mucous membranes are moist. Posterior pharynx clear of any exudate or lesions. Neck: normal, supple, no masses, no thyromegaly Respiratory: bilateral decreased breath sounds at bases, no wheezing, no crackles.  Intermittently tachypneic.  No accessory muscle use.  Cardiovascular: S1 S2 positive, intermittently tachycardic. No extremity edema. 2+ pedal pulses.  Abdomen: no tenderness, no masses palpated. No hepatosplenomegaly. Bowel sounds positive.  Musculoskeletal: no clubbing / cyanosis. No joint deformity upper and lower extremities.  Skin: no rashes, lesions, ulcers. No induration Neurologic: CN 2-12 grossly intact. Moving extremities. No focal neurologic deficits.  Psychiatric: Normal judgment and insight. Alert and oriented x 3. Normal mood.    Labs on Admission: I have personally reviewed following labs and imaging studies  CBC: Recent Labs  Lab 02/28/20 1625  WBC 7.6  NEUTROABS 6.1  HGB 6.2*  HCT 23.8*  MCV 70.4*  PLT 99991111*   Basic Metabolic Panel: Recent Labs  Lab 02/28/20 1625  NA 139  K 3.3*  CL 102  CO2 25  GLUCOSE 121*  BUN 15  CREATININE 0.76  CALCIUM 9.4    GFR: CrCl cannot be calculated (Unknown ideal weight.). Liver Function Tests: Recent Labs  Lab 02/28/20 1625  AST 17  ALT 16  ALKPHOS 67  BILITOT 0.6  PROT 7.0  ALBUMIN 3.7   No results for input(s): LIPASE, AMYLASE in the last 168 hours. No results for input(s): AMMONIA in the last 168 hours. Coagulation Profile: No results for input(s): INR, PROTIME in the last 168 hours. Cardiac Enzymes: No results for input(s): CKTOTAL, CKMB, CKMBINDEX, TROPONINI in the last 168 hours. BNP (last 3 results) No results for input(s): PROBNP in the last 8760 hours. HbA1C: No results for input(s): HGBA1C in the last 72 hours. CBG: No  results for input(s): GLUCAP in the last 168 hours. Lipid Profile: No results for input(s): CHOL, HDL, LDLCALC, TRIG, CHOLHDL, LDLDIRECT in the last 72 hours. Thyroid Function Tests: Recent Labs    02/28/20 1625  TSH 4.933*   Anemia Panel: No results for input(s): VITAMINB12, FOLATE, FERRITIN, TIBC, IRON, RETICCTPCT in the last 72 hours. Urine analysis:    Component Value Date/Time   LABSPEC 1.010 12/26/2019 1226   PHURINE 7.0 12/26/2019 1226   GLUCOSEU NEGATIVE 12/26/2019 1226   HGBUR NEGATIVE 12/26/2019 1226   BILIRUBINUR NEGATIVE 12/26/2019 1226   KETONESUR NEGATIVE 12/26/2019 1226   PROTEINUR NEGATIVE 12/26/2019 1226   UROBILINOGEN 0.2 12/26/2019 1226   NITRITE NEGATIVE 12/26/2019 1226   LEUKOCYTESUR SMALL (A) 12/26/2019 1226    Radiological Exams on Admission: No results found.  EKG: Independently reviewed.  Sinus tachycardia.  No ST elevations or depression.  Assessment/Plan  Symptomatic anemia with concern for acute blood loss anemia Exertional dyspnea most likely secondary to symptomatic anemia Probable GI bleeding: Probable upper GI bleed -Patient presented with hemoglobin of 6.2.  Her last hemoglobin was 11.3 on 10/04/2018 -Transfused unit packed red cells.  Monitor H&H.  Start Protonix 40 mg IV every 12 hours.  No overt signs of GI  bleeding although patient complains of intermittent dark stools.  Fecal occult blood testing negative. -Dr. Hoyt Koch has been notified by the ED provider.  Follow GI recommendations. -2D echo.  Patient was recently evaluated by cardiology for dyspnea and 2D echo was planned to be done as an outpatient.  Depression -Continue home regimen  Thrombocytosis -Most likely reactive.  Monitor.  Hypertension -Blood pressure stable.  Monitor  DVT prophylaxis: SCDs Code Status: DNR Family Communication: Spoke to patient at bedside Disposition Plan: Home in 1 to 2 days once condition improves and once cleared by GI Consults called: GI/Dr. Collene Mares called by ED provider Admission status: Observation/telemetry  Severity of Illness: The appropriate patient status for this patient is OBSERVATION. Observation status is judged to be reasonable and necessary in order to provide the required intensity of service to ensure the patient's safety. The patient's presenting symptoms, physical exam findings, and initial radiographic and laboratory data in the context of their medical condition is felt to place them at decreased risk for further clinical deterioration. Furthermore, it is anticipated that the patient will be medically stable for discharge from the hospital within 2 midnights of admission. The following factors support the patient status of observation.   " The patient's presenting symptoms include shortness of breath/fatigue. " The physical exam findings include tachycardia/tachypnea. " The initial radiographic and laboratory data are anemia.      Aline August MD [facility]  02/28/2020, 5:48 PM

## 2020-02-28 NOTE — ED Triage Notes (Signed)
Patient went to her PCP today With c/o SOB x 1 week. Patient was sent to the ED for concerns of anemia.

## 2020-02-28 NOTE — ED Provider Notes (Signed)
Dr. Gustavus Messing signed out patient to me pending hospitalist call back.  The pt was d/w Dr. Starla Link ([facility]).  He requested that I call Dr. Collene Mares who has seen pt in the past.  I did speak with Dr. Collene Mares who will see pt tomorrow after she gets her blood.   Isla Pence, MD 02/28/20 1750

## 2020-02-28 NOTE — ED Notes (Signed)
Date and time results received: 02/28/20 5:23 PM   (use smartphrase ".now" to insert current time)  Test: Lactic Critical Value: 2.0  Name of Provider Notified: [place]  Orders Received? Or Actions Taken?: Orders Received - See Orders for details

## 2020-02-29 ENCOUNTER — Observation Stay (HOSPITAL_BASED_OUTPATIENT_CLINIC_OR_DEPARTMENT_OTHER): Payer: PPO

## 2020-02-29 DIAGNOSIS — K449 Diaphragmatic hernia without obstruction or gangrene: Secondary | ICD-10-CM | POA: Diagnosis present

## 2020-02-29 DIAGNOSIS — I340 Nonrheumatic mitral (valve) insufficiency: Secondary | ICD-10-CM | POA: Diagnosis not present

## 2020-02-29 DIAGNOSIS — Z66 Do not resuscitate: Secondary | ICD-10-CM | POA: Diagnosis present

## 2020-02-29 DIAGNOSIS — R195 Other fecal abnormalities: Secondary | ICD-10-CM | POA: Diagnosis present

## 2020-02-29 DIAGNOSIS — Z85828 Personal history of other malignant neoplasm of skin: Secondary | ICD-10-CM | POA: Diagnosis not present

## 2020-02-29 DIAGNOSIS — F329 Major depressive disorder, single episode, unspecified: Secondary | ICD-10-CM | POA: Diagnosis present

## 2020-02-29 DIAGNOSIS — E876 Hypokalemia: Secondary | ICD-10-CM | POA: Diagnosis not present

## 2020-02-29 DIAGNOSIS — R05 Cough: Secondary | ICD-10-CM | POA: Diagnosis present

## 2020-02-29 DIAGNOSIS — R7989 Other specified abnormal findings of blood chemistry: Secondary | ICD-10-CM | POA: Diagnosis not present

## 2020-02-29 DIAGNOSIS — K219 Gastro-esophageal reflux disease without esophagitis: Secondary | ICD-10-CM | POA: Diagnosis present

## 2020-02-29 DIAGNOSIS — Z20822 Contact with and (suspected) exposure to covid-19: Secondary | ICD-10-CM | POA: Diagnosis present

## 2020-02-29 DIAGNOSIS — R5383 Other fatigue: Secondary | ICD-10-CM | POA: Diagnosis present

## 2020-02-29 DIAGNOSIS — Z7902 Long term (current) use of antithrombotics/antiplatelets: Secondary | ICD-10-CM | POA: Diagnosis not present

## 2020-02-29 DIAGNOSIS — I10 Essential (primary) hypertension: Secondary | ICD-10-CM | POA: Diagnosis present

## 2020-02-29 DIAGNOSIS — Z85840 Personal history of malignant neoplasm of eye: Secondary | ICD-10-CM | POA: Diagnosis not present

## 2020-02-29 DIAGNOSIS — R0602 Shortness of breath: Secondary | ICD-10-CM | POA: Diagnosis present

## 2020-02-29 DIAGNOSIS — D509 Iron deficiency anemia, unspecified: Secondary | ICD-10-CM | POA: Diagnosis present

## 2020-02-29 DIAGNOSIS — Z87442 Personal history of urinary calculi: Secondary | ICD-10-CM | POA: Diagnosis not present

## 2020-02-29 DIAGNOSIS — Z79899 Other long term (current) drug therapy: Secondary | ICD-10-CM | POA: Diagnosis not present

## 2020-02-29 DIAGNOSIS — D649 Anemia, unspecified: Secondary | ICD-10-CM | POA: Diagnosis present

## 2020-02-29 DIAGNOSIS — K922 Gastrointestinal hemorrhage, unspecified: Secondary | ICD-10-CM | POA: Diagnosis present

## 2020-02-29 DIAGNOSIS — Z7982 Long term (current) use of aspirin: Secondary | ICD-10-CM | POA: Diagnosis not present

## 2020-02-29 DIAGNOSIS — D62 Acute posthemorrhagic anemia: Secondary | ICD-10-CM | POA: Diagnosis present

## 2020-02-29 DIAGNOSIS — Z882 Allergy status to sulfonamides status: Secondary | ICD-10-CM | POA: Diagnosis not present

## 2020-02-29 DIAGNOSIS — E890 Postprocedural hypothyroidism: Secondary | ICD-10-CM | POA: Diagnosis present

## 2020-02-29 LAB — ECHOCARDIOGRAM COMPLETE
Height: 60 in
Weight: 1915.36 oz

## 2020-02-29 LAB — TYPE AND SCREEN
ABO/RH(D): O POS
Antibody Screen: NEGATIVE
Unit division: 0
Unit division: 0

## 2020-02-29 LAB — PROTIME-INR
INR: 1.1 (ref 0.8–1.2)
Prothrombin Time: 13.3 seconds (ref 11.4–15.2)

## 2020-02-29 LAB — CBC
HCT: 29.5 % — ABNORMAL LOW (ref 36.0–46.0)
Hemoglobin: 8.7 g/dL — ABNORMAL LOW (ref 12.0–15.0)
MCH: 22.8 pg — ABNORMAL LOW (ref 26.0–34.0)
MCHC: 29.5 g/dL — ABNORMAL LOW (ref 30.0–36.0)
MCV: 77.2 fL — ABNORMAL LOW (ref 80.0–100.0)
Platelets: 338 10*3/uL (ref 150–400)
RBC: 3.82 MIL/uL — ABNORMAL LOW (ref 3.87–5.11)
RDW: 22.3 % — ABNORMAL HIGH (ref 11.5–15.5)
WBC: 5.6 10*3/uL (ref 4.0–10.5)
nRBC: 0 % (ref 0.0–0.2)

## 2020-02-29 LAB — BPAM RBC
Blood Product Expiration Date: 202107062359
Blood Product Expiration Date: 202107062359
ISSUE DATE / TIME: 202106021840
ISSUE DATE / TIME: 202106022143
Unit Type and Rh: 5100
Unit Type and Rh: 5100

## 2020-02-29 LAB — COMPREHENSIVE METABOLIC PANEL
ALT: 14 U/L (ref 0–44)
AST: 14 U/L — ABNORMAL LOW (ref 15–41)
Albumin: 3.2 g/dL — ABNORMAL LOW (ref 3.5–5.0)
Alkaline Phosphatase: 63 U/L (ref 38–126)
Anion gap: 8 (ref 5–15)
BUN: 11 mg/dL (ref 8–23)
CO2: 24 mmol/L (ref 22–32)
Calcium: 8.3 mg/dL — ABNORMAL LOW (ref 8.9–10.3)
Chloride: 105 mmol/L (ref 98–111)
Creatinine, Ser: 0.76 mg/dL (ref 0.44–1.00)
GFR calc Af Amer: >60 mL/min (ref >60)
GFR calc non Af Amer: >60 mL/min (ref >60)
Glucose, Bld: 100 mg/dL — ABNORMAL HIGH (ref 70–99)
Potassium: 3.2 mmol/L — ABNORMAL LOW (ref 3.5–5.1)
Sodium: 137 mmol/L (ref 135–145)
Total Bilirubin: 0.9 mg/dL (ref 0.3–1.2)
Total Protein: 6 g/dL — ABNORMAL LOW (ref 6.5–8.1)

## 2020-02-29 LAB — URINE CULTURE

## 2020-02-29 MED ORDER — POTASSIUM CHLORIDE 10 MEQ/100ML IV SOLN
10.0000 meq | INTRAVENOUS | Status: AC
  Administered 2020-02-29 (×3): 10 meq via INTRAVENOUS
  Filled 2020-02-29 (×3): qty 100

## 2020-02-29 MED ORDER — SODIUM CHLORIDE 0.9 % IV SOLN: INTRAVENOUS | Status: DC

## 2020-02-29 MED ORDER — PEG 3350-KCL-NA BICARB-NACL 420 G PO SOLR
4000.0000 mL | Freq: Once | ORAL | Status: AC
  Administered 2020-02-29: 4000 mL via ORAL

## 2020-02-29 MED ORDER — BIOTENE DRY MOUTH MT LIQD: 15.0000 mL | OROMUCOSAL | Status: DC | PRN

## 2020-02-29 MED ORDER — POTASSIUM CHLORIDE 10 MEQ/100ML IV SOLN
INTRAVENOUS | Status: AC
  Administered 2020-02-29: 10 meq
  Filled 2020-02-29: qty 100

## 2020-02-29 NOTE — Progress Notes (Signed)
  Echocardiogram 2D Echocardiogram has been performed.  Geoffery Lyons Swaim 02/29/2020, 8:50 AM

## 2020-02-29 NOTE — Plan of Care (Signed)

## 2020-02-29 NOTE — Plan of Care (Signed)

## 2020-02-29 NOTE — Consult Note (Signed)
Reason for Consult: Anemia Referring Physician: [facility]  [name] HPI: This a 74 year old female with a PMH of HTN, LA Grade B esophagitis on 04/22/2016, GERD, tubular adenoma with a colonoscopy on 08/27/2017, and s/p coiling for a cerebral aneurysm admitted for complaints of SOB.  The patient's symptoms progressively worsened over time.  She presented to the ER with her complaints and her HGB was noted to be at 6.2 g/dL with an MCV of 70.4, which was a change from her blood work on 10/04/2018:  HGB 11.3 g/dL and MCV 91.7.  He was heme negative with this admission.  At home, per her husband, she complained about GERD issues for the past two weeks.  She was better with some over-the-counter medication.   Past Medical History:  Diagnosis Date  . Anemia    years ago  . Arthritis   . Basal cell carcinoma of eye    biopsy of left eye/ non cancerous  . Complication of anesthesia    nausea/vomiting  . Depression   . GERD (gastroesophageal reflux disease)   . History of kidney stones   . Hypertension    no longer on medications    Past Surgical History:  Procedure Laterality Date  . CEREBRAL ANEURYSM REPAIR     1998  . COLONOSCOPY    . ECTOPIC PREGNANCY SURGERY     x2   . ESOPHAGOGASTRODUODENOSCOPY N/A 04/22/2016   Procedure: ESOPHAGOGASTRODUODENOSCOPY (EGD);  Surgeon: Juanita Craver, MD;  Location: [facility];  Service: Endoscopy;  Laterality: N/A;  . IR 3D INDEPENDENT WKST  09/05/2018  . IR ANGIO INTRA EXTRACRAN SEL COM CAROTID INNOMINATE BILAT MOD SED  09/05/2018  . IR ANGIO VERTEBRAL SEL VERTEBRAL UNI R MOD SED  09/05/2018  . IR ANGIOGRAM FOLLOW UP STUDY  09/05/2018  . IR TRANSCATH/EMBOLIZ  09/05/2018  . PARATHYROIDECTOMY     2002  . RADIOLOGY WITH ANESTHESIA N/A 09/05/2018   Procedure: EMBOLIZATION;  Surgeon: Radiologist, Medication, MD;  Location: [facility];  Service: Radiology;  Laterality: N/A;  . TONSILLECTOMY     1953  . VAGINAL HYSTERECTOMY     2001  . VOCAL CORD  LATERALIZATION, ENDOSCOPIC APPROACH W/ MLB     2007 (@[facility] )  . WRIST SURGERY     left side/2008  . WRIST SURGERY     right side/ 2002    Family History  Family history unknown: Yes    Social History:  reports that she has never smoked. She has never used smokeless tobacco. She reports that she does not drink alcohol or use drugs.  Allergies:  Allergies  Allergen Reactions  . Sulfa Antibiotics Other (See Comments)    Reaction:  Unknown     Medications:  Scheduled: . DULoxetine  120 mg Oral QHS  . pantoprazole (PROTONIX) IV  40 mg Intravenous Q12H  . QUEtiapine  25 mg Oral QHS   Continuous:   Results for orders placed or performed during the hospital encounter of 02/28/20 (from the past 24 hour(s))  CBC with Differential     Status: Abnormal   Collection Time: 02/28/20  4:25 PM  Result Value Ref Range   WBC 7.6 4.0 - 10.5 K/uL   RBC 3.38 (L) 3.87 - 5.11 MIL/uL   Hemoglobin 6.2 (LL) 12.0 - 15.0 g/dL   HCT 23.8 (L) 36.0 - 46.0 %   MCV 70.4 (L) 80.0 - 100.0 fL   MCH 18.3 (L) 26.0 - 34.0 pg   MCHC 26.1 (L) 30.0 -  36.0 g/dL   RDW 17.6 (H) 11.5 - 15.5 %   Platelets 437 (H) 150 - 400 K/uL   nRBC 0.0 0.0 - 0.2 %   Neutrophils Relative % 81 %   Neutro Abs 6.1 1.7 - 7.7 K/uL   Lymphocytes Relative 10 %   Lymphs Abs 0.8 0.7 - 4.0 K/uL   Monocytes Relative 9 %   Monocytes Absolute 0.7 0.1 - 1.0 K/uL   Eosinophils Relative 0 %   Eosinophils Absolute 0.0 0.0 - 0.5 K/uL   Basophils Relative 0 %   Basophils Absolute 0.0 0.0 - 0.1 K/uL   Immature Granulocytes 0 %   Abs Immature Granulocytes 0.02 0.00 - 0.07 K/uL  Comprehensive metabolic panel     Status: Abnormal   Collection Time: 02/28/20  4:25 PM  Result Value Ref Range   Sodium 139 135 - 145 mmol/L   Potassium 3.3 (L) 3.5 - 5.1 mmol/L   Chloride 102 98 - 111 mmol/L   CO2 25 22 - 32 mmol/L   Glucose, Bld 121 (H) 70 - 99 mg/dL   BUN 15 8 - 23 mg/dL   Creatinine, Ser 0.76 0.44 - 1.00 mg/dL   Calcium 9.4 8.9 - 10.3  mg/dL   Total Protein 7.0 6.5 - 8.1 g/dL   Albumin 3.7 3.5 - 5.0 g/dL   AST 17 15 - 41 U/L   ALT 16 0 - 44 U/L   Alkaline Phosphatase 67 38 - 126 U/L   Total Bilirubin 0.6 0.3 - 1.2 mg/dL   GFR calc non Af Amer >60 >60 mL/min   GFR calc Af Amer >60 >60 mL/min   Anion gap 12 5 - 15  Brain natriuretic peptide     Status: None   Collection Time: 02/28/20  4:25 PM  Result Value Ref Range   B Natriuretic Peptide 53.3 0.0 - 100.0 pg/mL  Troponin I (High Sensitivity)     Status: None   Collection Time: 02/28/20  4:25 PM  Result Value Ref Range   Troponin I (High Sensitivity) 4 <18 ng/L  Type and screen [place]     Status: None   Collection Time: 02/28/20  4:25 PM  Result Value Ref Range   ABO/RH(D) O POS    Antibody Screen NEG    Sample Expiration 03/02/2020,2359    Unit Number [id number]    Blood Component Type RED CELLS,LR    Unit division 00    Status of Unit ISSUED,FINAL    Transfusion Status OK TO TRANSFUSE    Crossmatch Result      Compatible Performed at [facility], [place] [address], [place], [place] [address]    Unit Number [phone]    Blood Component Type RED CELLS,LR    Unit division 00    Status of Unit ISSUED,FINAL    Transfusion Status OK TO TRANSFUSE    Crossmatch Result Compatible   TSH     Status: Abnormal   Collection Time: 02/28/20  4:25 PM  Result Value Ref Range   TSH 4.933 (H) 0.350 - 4.500 uIU/mL  Lactic acid, plasma     Status: Abnormal   Collection Time: 02/28/20  4:25 PM  Result Value Ref Range   Lactic Acid, Venous 2.0 (HH) 0.5 - 1.9 mmol/L  D-dimer, quantitative (not at [facility])     Status: None   Collection Time: 02/28/20  4:25 PM  Result Value Ref Range   D-Dimer, Quant 0.39 0.00 - 0.50 ug/mL-FEU  Prepare RBC (crossmatch)     Status: None   Collection Time: 02/28/20  4:33 PM  Result Value Ref Range   Order Confirmation      ORDER PROCESSED BY BLOOD BANK Performed at [facility], [place] [address] [place], [place] [address]   POC occult blood, ED     Status: None   Collection Time: 02/28/20  4:35 PM  Result Value Ref Range   Fecal Occult Bld NEGATIVE NEGATIVE  SARS Coronavirus 2 by RT PCR (hospital order, performed in [place] hospital lab) Nasopharyngeal Nasopharyngeal Swab     Status: None   Collection Time: 02/28/20  5:03 PM   Specimen: Nasopharyngeal Swab  Result Value Ref Range   SARS Coronavirus 2 NEGATIVE NEGATIVE  Urinalysis, Routine w reflex microscopic     Status: Abnormal   Collection Time: 02/28/20  6:05 PM  Result Value Ref Range   Color, Urine AMBER (A) YELLOW   APPearance HAZY (A) CLEAR   Specific Gravity, Urine 1.011 1.005 - 1.030   pH 7.0 5.0 - 8.0   Glucose, UA NEGATIVE NEGATIVE mg/dL   Hgb urine dipstick NEGATIVE NEGATIVE   Bilirubin Urine NEGATIVE NEGATIVE   Ketones, ur NEGATIVE NEGATIVE mg/dL   Protein, ur NEGATIVE NEGATIVE mg/dL   Nitrite POSITIVE (A) NEGATIVE   Leukocytes,Ua SMALL (A) NEGATIVE   RBC / HPF 0-5 0 - 5 RBC/hpf   WBC, UA 6-10 0 - 5 WBC/hpf   Bacteria, UA RARE (A) NONE SEEN   Squamous Epithelial / LPF 0-5 0 - 5   Mucus PRESENT    Hyaline Casts, UA PRESENT   Lactic acid, plasma     Status: None   Collection Time: 02/28/20  6:30 PM  Result Value Ref Range   Lactic Acid, Venous 1.5 0.5 - 1.9 mmol/L  Troponin I (High Sensitivity)     Status: None   Collection Time: 02/28/20  6:30 PM  Result Value Ref Range   Troponin I (High Sensitivity) 4 <18 ng/L  Comprehensive metabolic panel     Status: Abnormal   Collection Time: 02/29/20  5:40 AM  Result Value Ref Range   Sodium 137 135 - 145 mmol/L   Potassium 3.2 (L) 3.5 - 5.1 mmol/L   Chloride 105 98 - 111 mmol/L   CO2 24 22 - 32 mmol/L   Glucose, Bld 100 (H) 70 - 99 mg/dL   BUN 11 8 - 23 mg/dL   Creatinine, Ser 0.76 0.44 - 1.00 mg/dL   Calcium 8.3 (L) 8.9 - 10.3 mg/dL   Total Protein 6.0 (L) 6.5 - 8.1 g/dL   Albumin 3.2 (L) 3.5 - 5.0 g/dL   AST 14 (L) 15 -  41 U/L   ALT 14 0 - 44 U/L   Alkaline Phosphatase 63 38 - 126 U/L   Total Bilirubin 0.9 0.3 - 1.2 mg/dL   GFR calc non Af Amer >60 >60 mL/min   GFR calc Af Amer >60 >60 mL/min   Anion gap 8 5 - 15  Protime-INR     Status: None   Collection Time: 02/29/20  5:40 AM  Result Value Ref Range   Prothrombin Time 13.3 11.4 - 15.2 seconds   INR 1.1 0.8 - 1.2  CBC     Status: Abnormal   Collection Time: 02/29/20  5:40 AM  Result Value Ref Range   WBC 5.6 4.0 - 10.5 K/uL   RBC 3.82 (L) 3.87 - 5.11 MIL/uL   Hemoglobin 8.7 (L)  12.0 - 15.0 g/dL   HCT 29.5 (L) 36.0 - 46.0 %   MCV 77.2 (L) 80.0 - 100.0 fL   MCH 22.8 (L) 26.0 - 34.0 pg   MCHC 29.5 (L) 30.0 - 36.0 g/dL   RDW 22.3 (H) 11.5 - 15.5 %   Platelets 338 150 - 400 K/uL   nRBC 0.0 0.0 - 0.2 %     DG Chest 2 View  Result Date: 02/28/2020 CLINICAL DATA:  Shortness of breath EXAM: CHEST - 2 VIEW COMPARISON:  12/26/2019 FINDINGS: Cardiomegaly. Airspace opacity at the left base suspected, much of which may reflect hiatal hernia. Right lung clear. No effusions. No acute bony abnormality. Severe rightward scoliosis. IMPRESSION: Opacity at the left lung base felt to most likely be related to the left hemidiaphragm and large hiatal hernia. No definite acute process. Cardiomegaly. Electronically Signed   By: Rolm Baptise M.D.   On: 02/28/2020 18:28   ECHOCARDIOGRAM COMPLETE  Result Date: 02/29/2020    ECHOCARDIOGRAM REPORT   Patient Name:   Elaine Rivera Date of Exam: 02/29/2020 Medical Rec #:  [id number]         Height:       60.0 in Accession #:    [id number]        Weight:       119.7 lb Date of Birth:  [birth year]         BSA:          1.501 m Patient Age:    78 years          BP:           135/95 mmHg Patient Gender: F                 HR:           79 bpm. Exam Location:  Inpatient Procedure: 2D Echo, Cardiac Doppler and Color Doppler Indications:    Dyspnea 786.09 / R06.00  History:        Patient has no prior history of Echocardiogram examinations.                  Signs/Symptoms:Shortness of Breath; Risk Factors:Hypertension                 and Non-Smoker. GERD.  Sonographer:    Vickie Epley RDCS Referring Phys: [id number] [place]  1. Left ventricular ejection fraction, by estimation, is 60 to 65%. The left ventricle has normal function. The left ventricle has no regional wall motion abnormalities. Left ventricular diastolic parameters are consistent with Grade I diastolic dysfunction (impaired relaxation).  2. Right ventricular systolic function is normal. The right ventricular size is normal. There is normal pulmonary artery systolic pressure.  3. The mitral valve is normal in structure. Mild mitral valve regurgitation. No evidence of mitral stenosis.  4. The aortic valve is normal in structure. Aortic valve regurgitation is not visualized. No aortic stenosis is present. FINDINGS  Left Ventricle: Left ventricular ejection fraction, by estimation, is 60 to 65%. The left ventricle has normal function. The left ventricle has no regional wall motion abnormalities. The left ventricular internal cavity size was normal in size. There is  no left ventricular hypertrophy. Left ventricular diastolic parameters are consistent with Grade I diastolic dysfunction (impaired relaxation). Right Ventricle: The right ventricular size is normal. No increase in right ventricular wall thickness. Right ventricular systolic function is normal. There is normal pulmonary artery systolic pressure. The tricuspid regurgitant velocity is 1.92 m/s,  and  with an assumed right atrial pressure of 3 mmHg, the estimated right ventricular systolic pressure is 123456 mmHg. Left Atrium: Left atrial size was normal in size. Right Atrium: Right atrial size was normal in size. Pericardium: Trivial pericardial effusion is present. Mitral Valve: The mitral valve is normal in structure. Mild mitral valve regurgitation. No evidence of mitral valve stenosis. Tricuspid Valve: The tricuspid  valve is normal in structure. Tricuspid valve regurgitation is trivial. No evidence of tricuspid stenosis. Aortic Valve: The aortic valve is normal in structure. Aortic valve regurgitation is not visualized. No aortic stenosis is present. Pulmonic Valve: The pulmonic valve was normal in structure. Pulmonic valve regurgitation is not visualized. No evidence of pulmonic stenosis. Aorta: The aortic root and ascending aorta are structurally normal, with no evidence of dilitation. IAS/Shunts: The atrial septum is grossly normal.  LEFT VENTRICLE PLAX 2D LVIDd:         4.40 cm     Diastology LVIDs:         3.20 cm     LV e' lateral:   8.26 cm/s LV PW:         0.70 cm     LV E/e' lateral: 8.7 LV IVS:        0.70 cm     LV e' medial:    6.16 cm/s LVOT diam:     1.80 cm     LV E/e' medial:  11.7 LV SV:         49 LV SV Index:   33 LVOT Area:     2.54 cm  LV Volumes (MOD) LV vol d, MOD A2C: 71.4 ml LV vol d, MOD A4C: 69.2 ml LV vol s, MOD A2C: 30.7 ml LV vol s, MOD A4C: 28.1 ml LV SV MOD A2C:     40.7 ml LV SV MOD A4C:     69.2 ml LV SV MOD BP:      42.4 ml RIGHT VENTRICLE RV S prime:     11.10 cm/s TAPSE (M-mode): 1.6 cm LEFT ATRIUM             Index       RIGHT ATRIUM          Index LA diam:        3.10 cm 2.07 cm/m  RA Area:     9.01 cm LA Vol (A2C):   14.8 ml 9.86 ml/m  RA Volume:   15.70 ml 10.46 ml/m LA Vol (A4C):   20.9 ml 13.93 ml/m LA Biplane Vol: 19.5 ml 12.99 ml/m  AORTIC VALVE LVOT Vmax:   97.90 cm/s LVOT Vmean:  68.300 cm/s LVOT VTI:    0.193 m  AORTA Ao Root diam: 3.20 cm MITRAL VALVE               TRICUSPID VALVE MV Area (PHT): 4.57 cm    TR Peak grad:   14.7 mmHg MV Decel Time: 166 msec    TR Vmax:        192.00 cm/s MV E velocity: 72.00 cm/s MV A velocity: 85.30 cm/s  SHUNTS MV E/A ratio:  0.84        Systemic VTI:  0.19 m                            Systemic Diam: 1.80 cm Mertie Moores MD Electronically signed by Mertie Moores MD Signature Date/Time: 02/29/2020/10:57:58 AM    Final  ROS:  As stated  above in the HPI otherwise negative.  Blood pressure (!) 153/82, pulse 86, temperature (!) 97.3 F (36.3 C), temperature source Oral, resp. rate 20, height 5' (1.524 m), weight 59.6 kg, SpO2 97 %.    PE: Gen: NAD, Alert and Oriented HEENT:  [place]/AT, EOMI Neck: Supple, no LAD Lungs: CTA Bilaterally CV: RRR without M/G/R ABD: Soft, NTND, +BS Ext: No C/C/E  Assessment/Plan: 1) Microcytic anemia. 2) Symptomatic anemia. 3) GERD. 4) HTN.   Further evaluation will be performed with an enteroscopy and colonoscopy.  She does report having GERD symptoms of late and dark stools.    Plan: 1) Enteroscopy/colonoscopy tomorrow. Marlisha Vanwyk D 02/29/2020, 3:35 PM

## 2020-02-29 NOTE — Progress Notes (Signed)
Patient ID: Elaine Rivera, female   DOB: [birth year], 74 y.o.   MRN: [id number]  PROGRESS NOTE    Elaine Rivera  [id number] DOB: 25-Oct-1945 DOA: 02/28/2020 PCP: Carol Ada, MD   Brief Narrative:  74 y.o. female with medical history significant of hypertension, depression, GERD, prior upper GI bleeding requiring blood transfusion, prior cerebral aneurysm status post coiling, arthritis presented with progressively worsening shortness of breath for the last week.  On presentation, she was found to have hemoglobin of 6.2 with her last hemoglobin being 11.3 on 08/05/2019 pack red cells.  GI was consulted.  She was also started on IV Protonix.  Assessment & Plan:   Symptomatic anemia with concern for acute blood loss anemia Exertional dyspnea most likely secondary to symptomatic anemia Probable GI bleeding: Probable upper GI bleed -Patient presented with hemoglobin of 6.2.  Her last hemoglobin was 11.3 on 10/04/2018 -Transfused 2 unit packed red cells.  Monitor H&H.    Hemoglobin 8.7 today.  Continue twice a day IV Protonix.   -Continue n.p.o. for now.  Follow GI recommendations. -Follow 2D echo.  Patient was recently evaluated by cardiology for dyspnea and 2D echo was planned to be done as an outpatient.  Hypokalemia -Replace.  Repeat a.m. labs  Depression -Continue home regimen  Thrombocytosis -Most likely reactive.  Improved.  Hypertension -Blood pressure stable.  Monitor     DVT prophylaxis: SCDs Code Status: DNR Family Communication: Spoke to patient at bedside Disposition Plan: Status is: Observation  The patient remains OBS appropriate and will d/c before 2 midnights.  Hemoglobin has improved.  If GI does not plan on any intervention, then patient probably will be discharged home today.  Otherwise, patient might need to stay in extra night tonight.  Dispo: The patient is from: Home              Anticipated d/c is to: Home              Anticipated d/c date is: 1  day              Patient currently is not medically stable to d/c.   Consultants: GI  Procedures: None  Antimicrobials: None   Subjective: Patient seen and examined at bedside.  Denies any overnight black or bloody stools.  No overnight fever, nausea, vomiting or abdominal pain.  Objective: Vitals:   02/29/20 0002 02/29/20 0012 02/29/20 0529 02/29/20 0816  BP: (!) 143/74 133/75 (!) 135/95 137/86  Pulse: 95 94 88 77  Resp: 18 18 18 18   Temp: 97.7 F (36.5 C) 98.2 F (36.8 C) 97.8 F (36.6 C) 97.8 F (36.6 C)  TempSrc: Oral Oral Oral Oral  SpO2: 94% 96% 96% 95%  Weight:      Height:        Intake/Output Summary (Last 24 hours) at 02/29/2020 1044 Last data filed at 02/29/2020 0300 Gross per 24 hour  Intake 1305.87 ml  Output 4 ml  Net 1301.87 ml   Filed Weights   02/28/20 2001  Weight: 54.3 kg    Examination:  General exam: Appears calm and comfortable  Respiratory system: Bilateral decreased breath sounds at bases Cardiovascular system: S1 & S2 heard, Rate controlled Gastrointestinal system: Abdomen is nondistended, soft and nontender. Normal bowel sounds heard. Extremities: No cyanosis, clubbing, edema  Central nervous system: Alert and oriented. No focal neurological deficits. Moving extremities Skin: No rashes, lesions or ulcers Psychiatry: Judgement and insight appear normal. Mood & affect appropriate.  Data Reviewed: I have personally reviewed following labs and imaging studies  CBC: Recent Labs  Lab 02/28/20 1625 02/29/20 0540  WBC 7.6 5.6  NEUTROABS 6.1  --   HGB 6.2* 8.7*  HCT 23.8* 29.5*  MCV 70.4* 77.2*  PLT 437* [id number]   Basic Metabolic Panel: Recent Labs  Lab 02/28/20 1625 02/29/20 0540  NA 139 137  K 3.3* 3.2*  CL 102 105  CO2 25 24  GLUCOSE 121* 100*  BUN 15 11  CREATININE 0.76 0.76  CALCIUM 9.4 8.3*   GFR: Estimated Creatinine Clearance: 44.3 mL/min (by C-G formula based on SCr of 0.76 mg/dL). Liver Function Tests: Recent  Labs  Lab 02/28/20 1625 02/29/20 0540  AST 17 14*  ALT 16 14  ALKPHOS 67 63  BILITOT 0.6 0.9  PROT 7.0 6.0*  ALBUMIN 3.7 3.2*   No results for input(s): LIPASE, AMYLASE in the last 168 hours. No results for input(s): AMMONIA in the last 168 hours. Coagulation Profile: Recent Labs  Lab 02/29/20 0540  INR 1.1   Cardiac Enzymes: No results for input(s): CKTOTAL, CKMB, CKMBINDEX, TROPONINI in the last 168 hours. BNP (last 3 results) No results for input(s): PROBNP in the last 8760 hours. HbA1C: No results for input(s): HGBA1C in the last 72 hours. CBG: No results for input(s): GLUCAP in the last 168 hours. Lipid Profile: No results for input(s): CHOL, HDL, LDLCALC, TRIG, CHOLHDL, LDLDIRECT in the last 72 hours. Thyroid Function Tests: Recent Labs    02/28/20 1625  TSH 4.933*   Anemia Panel: No results for input(s): VITAMINB12, FOLATE, FERRITIN, TIBC, IRON, RETICCTPCT in the last 72 hours. Sepsis Labs: Recent Labs  Lab 02/28/20 1625 02/28/20 1830  LATICACIDVEN 2.0* 1.5    Recent Results (from the past 240 hour(s))  SARS Coronavirus 2 by RT PCR (hospital order, performed in [facility] hospital lab) Nasopharyngeal Nasopharyngeal Swab     Status: None   Collection Time: 02/28/20  5:03 PM   Specimen: Nasopharyngeal Swab  Result Value Ref Range Status   SARS Coronavirus 2 NEGATIVE NEGATIVE Final    Comment: (NOTE) SARS-CoV-2 target nucleic acids are NOT DETECTED. The SARS-CoV-2 RNA is generally detectable in upper and lower respiratory specimens during the acute phase of infection. The lowest concentration of SARS-CoV-2 viral copies this assay can detect is 250 copies / mL. A negative result does not preclude SARS-CoV-2 infection and should not be used as the sole basis for treatment or other patient management decisions.  A negative result may occur with improper specimen collection / handling, submission of specimen other than nasopharyngeal swab, presence of  viral mutation(s) within the areas targeted by this assay, and inadequate number of viral copies (<250 copies / mL). A negative result must be combined with clinical observations, patient history, and epidemiological information. Fact Sheet for Patients:   StrictlyIdeas.no Fact Sheet for Healthcare Providers: BankingDealers.co.za This test is not yet approved or cleared  by the [place] FDA and has been authorized for detection and/or diagnosis of SARS-CoV-2 by FDA under an Emergency Use Authorization (EUA).  This EUA will remain in effect (meaning this test can be used) for the duration of the COVID-19 declaration under Section 564(b)(1) of the Act, 21 U.S.C. section 360bbb-3(b)(1), unless the authorization is terminated or revoked sooner. Performed at [facility], [place] [address], [place], [place] [address]          Radiology Studies: DG Chest 2 View  Result Date: 02/28/2020 CLINICAL DATA:  Shortness  of breath EXAM: CHEST - 2 VIEW COMPARISON:  12/26/2019 FINDINGS: Cardiomegaly. Airspace opacity at the left base suspected, much of which may reflect hiatal hernia. Right lung clear. No effusions. No acute bony abnormality. Severe rightward scoliosis. IMPRESSION: Opacity at the left lung base felt to most likely be related to the left hemidiaphragm and large hiatal hernia. No definite acute process. Cardiomegaly. Electronically Signed   By: Rolm Baptise M.D.   On: 02/28/2020 18:28        Scheduled Meds: . DULoxetine  120 mg Oral QHS  . pantoprazole (PROTONIX) IV  40 mg Intravenous Q12H  . QUEtiapine  25 mg Oral QHS   Continuous Infusions: . potassium chloride 10 mEq (02/29/20 0900)          Aline August, MD [facility] 02/29/2020, 10:44 AM

## 2020-03-01 ENCOUNTER — Inpatient Hospital Stay (HOSPITAL_COMMUNITY): Payer: PPO | Admitting: Certified Registered"

## 2020-03-01 ENCOUNTER — Encounter (HOSPITAL_COMMUNITY): Payer: Self-pay | Admitting: Internal Medicine

## 2020-03-01 ENCOUNTER — Encounter (HOSPITAL_COMMUNITY)
Admission: EM | Disposition: A | Discharge status: Home / Self Care | Payer: Self-pay | Source: Home / Self Care | Attending: Internal Medicine

## 2020-03-01 HISTORY — PX: ENTEROSCOPY: SHX5533

## 2020-03-01 LAB — CBC WITH DIFFERENTIAL/PLATELET
Abs Immature Granulocytes: 0.03 10*3/uL (ref 0.00–0.07)
Basophils Absolute: 0 10*3/uL (ref 0.0–0.1)
Basophils Relative: 1 %
Eosinophils Absolute: 0.1 10*3/uL (ref 0.0–0.5)
Eosinophils Relative: 2 %
HCT: 31 % — ABNORMAL LOW (ref 36.0–46.0)
Hemoglobin: 9 g/dL — ABNORMAL LOW (ref 12.0–15.0)
Immature Granulocytes: 1 %
Lymphocytes Relative: 19 %
Lymphs Abs: 1.1 10*3/uL (ref 0.7–4.0)
MCH: 22.7 pg — ABNORMAL LOW (ref 26.0–34.0)
MCHC: 29 g/dL — ABNORMAL LOW (ref 30.0–36.0)
MCV: 78.3 fL — ABNORMAL LOW (ref 80.0–100.0)
Monocytes Absolute: 0.7 10*3/uL (ref 0.1–1.0)
Monocytes Relative: 12 %
Neutro Abs: 3.9 10*3/uL (ref 1.7–7.7)
Neutrophils Relative %: 65 %
Platelets: 339 10*3/uL (ref 150–400)
RBC: 3.96 MIL/uL (ref 3.87–5.11)
RDW: 22.6 % — ABNORMAL HIGH (ref 11.5–15.5)
WBC: 5.9 10*3/uL (ref 4.0–10.5)
nRBC: 0 % (ref 0.0–0.2)

## 2020-03-01 LAB — BASIC METABOLIC PANEL
Anion gap: 11 (ref 5–15)
BUN: 10 mg/dL (ref 8–23)
CO2: 22 mmol/L (ref 22–32)
Calcium: 8.1 mg/dL — ABNORMAL LOW (ref 8.9–10.3)
Chloride: 109 mmol/L (ref 98–111)
Creatinine, Ser: 0.67 mg/dL (ref 0.44–1.00)
GFR calc Af Amer: >60 mL/min (ref >60)
GFR calc non Af Amer: >60 mL/min (ref >60)
Glucose, Bld: 100 mg/dL — ABNORMAL HIGH (ref 70–99)
Potassium: 4 mmol/L (ref 3.5–5.1)
Sodium: 142 mmol/L (ref 135–145)

## 2020-03-01 LAB — MAGNESIUM: Magnesium: 2.2 mg/dL (ref 1.7–2.4)

## 2020-03-01 SURGERY — ENTEROSCOPY
Anesthesia: Monitor Anesthesia Care

## 2020-03-01 MED ORDER — PROPOFOL 500 MG/50ML IV EMUL
INTRAVENOUS | Status: AC
  Filled 2020-03-01: qty 50

## 2020-03-01 MED ORDER — PROPOFOL 10 MG/ML IV BOLUS
INTRAVENOUS | Status: AC
  Filled 2020-03-01: qty 20

## 2020-03-01 MED ORDER — PROPOFOL 500 MG/50ML IV EMUL
INTRAVENOUS | Status: DC | PRN
  Administered 2020-03-01: 125 ug/kg/min via INTRAVENOUS

## 2020-03-01 MED ORDER — LIDOCAINE 2% (20 MG/ML) 5 ML SYRINGE
INTRAMUSCULAR | Status: DC | PRN
  Administered 2020-03-01: 40 mg via INTRAVENOUS

## 2020-03-01 MED ORDER — LACTATED RINGERS IV SOLN: INTRAVENOUS | Status: DC

## 2020-03-01 MED ORDER — PANTOPRAZOLE SODIUM 40 MG PO TBEC
40.0000 mg | DELAYED_RELEASE_TABLET | Freq: Every day | ORAL | 0 refills | Status: DC
Start: 2020-03-01 — End: 2021-03-05

## 2020-03-01 MED ORDER — PROPOFOL 10 MG/ML IV BOLUS
INTRAVENOUS | Status: DC | PRN
  Administered 2020-03-01: 20 mg via INTRAVENOUS

## 2020-03-01 NOTE — Discharge Instructions (Signed)
YOU HAD AN ENDOSCOPIC PROCEDURE TODAY: Refer to the procedure report and other information in the discharge instructions given to you for any specific questions about what was found during the examination. If this information does not answer your questions, please call [place] at [phone] to clarify.   YOU SHOULD EXPECT: Some feelings of bloating in the abdomen. Passage of more gas than usual. Walking can help get rid of the air that was put into your GI tract during the procedure and reduce the bloating. Some abdominal soreness may be present for a day or two, also.  DIET: Your first meal following the procedure should be a light meal and then it is ok to progress to your normal diet. A half-sandwich or bowl of soup is an example of a good first meal. Heavy or fried foods are harder to digest and may make you feel nauseous or bloated. Drink plenty of fluids but you should avoid alcoholic beverages for 24 hours.  ACTIVITY: Your care partner should take you home directly after the procedure. You should plan to take it easy, moving slowly for the rest of the day. You can resume normal activity the day after the procedure however YOU SHOULD NOT DRIVE, use power tools, machinery or perform tasks that involve climbing or major physical exertion for 24 hours (because of the sedation medicines used during the test).   SYMPTOMS TO REPORT IMMEDIATELY: A gastroenterologist can be reached at any hour. Please call [phone]  for any of the following symptoms:   Following upper endoscopy (EGD, EUS, ERCP, esophageal dilation) Vomiting of blood or coffee ground material  New, significant abdominal pain  New, significant chest pain or pain under the shoulder blades  Painful or persistently difficult swallowing  New shortness of breath  Black, tarry-looking or red, bloody stools  FOLLOW UP:  If any biopsies were taken you will be contacted by phone or by letter within the next 1-3 weeks. Call  [phone]  if you have not heard about the biopsies in 3 weeks.  Please also call with any specific questions about appointments or follow up tests.

## 2020-03-01 NOTE — Anesthesia Procedure Notes (Signed)
Procedure Name: MAC Date/Time: 03/01/2020 11:46 AM Performed by: Eben Burow, CRNA Pre-anesthesia Checklist: Patient identified, Emergency Drugs available, Suction available, Patient being monitored and Timeout performed Oxygen Delivery Method: Simple face mask Placement Confirmation: positive ETCO2 Dental Injury: Teeth and Oropharynx as per pre-operative assessment

## 2020-03-01 NOTE — Progress Notes (Signed)
Patient unable to tolerate bowel prep. Stated  Prep made here nauseas. zofran given  Drank less than 100cc

## 2020-03-01 NOTE — Op Note (Signed)
[facility] Patient Name: Elaine Rivera Procedure Date: 03/01/2020 MRN: [id number] Attending MD: Carol Ada , MD Date of Birth: 1946/08/03 CSN: [id number] Age: 74 Admit Type: Inpatient Procedure:                Small bowel enteroscopy Indications:              Iron deficiency anemia Providers:                Carol Ada, MD, Josie Dixon, RN, Laverda Sorenson, Technician, Jefm Miles CRNA Referring MD:              Medicines:                Propofol per Anesthesia Complications:            No immediate complications. Estimated Blood Loss:     Estimated blood loss: none. Procedure:                Pre-Anesthesia Assessment:                           - Prior to the procedure, a History and Physical                            was performed, and patient medications and                            allergies were reviewed. The patient's tolerance of                            previous anesthesia was also reviewed. The risks                            and benefits of the procedure and the sedation                            options and risks were discussed with the patient.                            All questions were answered, and informed consent                            was obtained. Prior Anticoagulants: The patient has                            taken no previous anticoagulant or antiplatelet                            agents. ASA Grade Assessment: II - A patient with                            mild systemic disease. After reviewing the risks  and benefits, the patient was deemed in                            satisfactory condition to undergo the procedure.                           - Sedation was administered by an anesthesia                            professional. Deep sedation was attained.                           After obtaining informed consent, the endoscope was                            passed under  direct vision. Throughout the                            procedure, the patient's blood pressure, pulse, and                            oxygen saturations were monitored continuously. The                            [id number]-[id number] ([id number] Olympus pediatric colonscope                            was introduced through the mouth and advanced to                            the small bowel distal to the Ligament of Treitz.                            The small bowel enteroscopy was accomplished                            without difficulty. The patient tolerated the                            procedure well. Scope In: Scope Out: Findings:      A 5 cm hiatal hernia was present.      A benign-appearing, intrinsic mild stenosis was found at the pylorus.       This was traversed.      The examined duodenum was normal.      There was no evidence of significant pathology in the entire examined       portion of jejunum.      Deep intubation of the jejunum was achieved. There was no evidence of       any source of bleeding. Impression:               - 5 cm hiatal hernia.                           - Gastric stenosis was found at the pylorus.                           -  Normal examined duodenum.                           - The examined portion of the jejunum was normal.                           - No specimens collected. Recommendation:           - Return patient to hospital ward for ongoing care.                           - Schedule for a colonoscopy. This procedure can be                            performed as an outpatient.                           - Follow up with Dr. Collene Mares. Procedure Code(s):        --- Professional ---                           [phone], Small intestinal endoscopy, enteroscopy                            beyond second portion of duodenum, not including                            ileum; diagnostic, including collection of                            specimen(s) by brushing or washing,  when performed                            (separate procedure) Diagnosis Code(s):        --- Professional ---                           D50.9, Iron deficiency anemia, unspecified                           K44.9, Diaphragmatic hernia without obstruction or                            gangrene                           K31.1, Adult hypertrophic pyloric stenosis CPT copyright 2019 [employer]. All rights reserved. The codes documented in this report are preliminary and upon coder review may  be revised to meet current compliance requirements. Carol Ada, MD Carol Ada, MD 03/01/2020 12:11:08 PM This report has been signed electronically. Number of Addenda: 0

## 2020-03-01 NOTE — Progress Notes (Signed)
Discharge instructions given.  Patient waiting for transportation home at this time

## 2020-03-01 NOTE — Anesthesia Postprocedure Evaluation (Signed)
Anesthesia Post Note  Patient: Elaine Rivera  Procedure(s) Performed: ENTEROSCOPY (N/A )     Patient location during evaluation: PACU Anesthesia Type: MAC Level of consciousness: awake and alert Pain management: pain level controlled Vital Signs Assessment: post-procedure vital signs reviewed and stable Respiratory status: spontaneous breathing, nonlabored ventilation, respiratory function stable and patient connected to nasal cannula oxygen Cardiovascular status: stable and blood pressure returned to baseline Postop Assessment: no apparent nausea or vomiting Anesthetic complications: no    Last Vitals:  Vitals:   03/01/20 1220 03/01/20 1230  BP: 114/62 121/62  Pulse: 87 83  Resp: 18 19  Temp:    SpO2: 93% 92%    Last Pain:  Vitals:   03/01/20 1230  TempSrc:   PainSc: 0-No pain                 Effie Berkshire

## 2020-03-01 NOTE — Transfer of Care (Signed)
Immediate Anesthesia Transfer of Care Note  Patient: Elaine Rivera  Procedure(s) Performed: ENTEROSCOPY (N/A )  Patient Location: PACU  Anesthesia Type:MAC  Level of Consciousness: awake, alert  and patient cooperative  Airway & Oxygen Therapy: Patient Spontanous Breathing and Patient connected to face mask oxygen  Post-op Assessment: Report given to RN and Post -op Vital signs reviewed and stable  Post vital signs: Reviewed and stable  Last Vitals:  Vitals Value Taken Time  BP 94/42 03/01/20 1211  Temp    Pulse 70 03/01/20 1212  Resp 17 03/01/20 1212  SpO2 100 % 03/01/20 1212  Vitals shown include unvalidated device data.  Last Pain:  Vitals:   03/01/20 1112  TempSrc: Oral  PainSc: 0-No pain         Complications: No apparent anesthesia complications

## 2020-03-01 NOTE — Discharge Summary (Signed)
Physician Discharge Summary  Elaine Rivera MHD:[id number] DOB: 1945/11/10 DOA: 02/28/2020  PCP: Carol Ada, MD  Admit date: 02/28/2020 Discharge date: 03/01/2020  Admitted From: Home Disposition: Home  Recommendations for Outpatient Follow-up:  1. Follow up with PCP in 1 week with repeat CBC/BMP 2. Outpatient follow-up with GI 3. Follow up in ED if symptoms worsen or new appear   Home Health: No Equipment/Devices: None  Discharge Condition: Stable CODE STATUS: Full Diet recommendation: Heart healthy  Brief/Interim Summary: 74 y.o.femalewith medical history significant ofhypertension, depression, GERD, prior upper GI bleeding requiring blood transfusion, prior cerebral aneurysm status post coiling, arthritis presented with progressively worsening shortness of breath for the last week.  On presentation, she was found to have hemoglobin of 6.2 with her last hemoglobin being 11.3 on 08/05/2019. GI was consulted.  She was also started on IV Protonix.  She was transfused 2 unit packed red cells on admission.  Hemoglobin has remained stable subsequently.  She underwent EGD today.  She will be discharged today on oral Protonix; GI has cleared the patient for discharge.  Outpatient follow-up with PCP and GI.  Discharge Diagnoses:   Symptomatic anemia with concern for acute blood loss anemia Exertional dyspnea most likely secondary to symptomatic anemia Probable GI bleeding: Probable upper GI bleed -Patient presented with hemoglobin of 6.2. Her last hemoglobin was 11.3 on 10/04/2018 -Transfused 2 unit packed red cells on admission. Currently on Protonix IV twice a day.  Hemoglobin 9 today. -Status post EGD today by GI which showed 5 cm hiatal hernia, gastric stenosis, normal examined duodenum and examined portion of jejunum was normal.  GI has cleared the patient for discharge on Protonix 40 mg daily.  Patient needs to follow-up with Dr. Collene Mares as an outpatient for colonoscopy. -Echo showed  EF of 60 to 65% with grade 1 diastolic dysfunction. Patient was recently evaluated by cardiology for dyspnea and 2D echo was planned to be done as an outpatient.  She can follow-up with cardiology as an outpatient  Hypokalemia -Improved.  Depression -Continue home regimen.  Outpatient follow-up with PCP.  Thrombocytosis -Most likely reactive.  Improved.  Hypertension -Blood pressure stable.  Outpatient follow-up.  Discharge Instructions   Allergies as of 03/01/2020      Reactions   Sulfa Antibiotics Other (See Comments)   Reaction:  Unknown       Medication List    STOP taking these medications   aspirin EC 81 MG tablet   famotidine 20 MG tablet Commonly known as: PEPCID   naproxen sodium 220 MG tablet Commonly known as: ALEVE     TAKE these medications   ALPRAZolam 0.5 MG tablet Commonly known as: XANAX Take 0.5 mg by mouth daily as needed for anxiety.   CALCIUM 600+D PO Take 1 tablet by mouth at bedtime.   cholecalciferol 25 MCG (1000 UNIT) tablet Commonly known as: VITAMIN D3 Take 1,000 Units by mouth at bedtime.   DULoxetine 60 MG capsule Commonly known as: CYMBALTA Take 120 mg by mouth at bedtime.   pantoprazole 40 MG tablet Commonly known as: Protonix Take 1 tablet (40 mg total) by mouth daily.   TURMERIC PO Take 1 capsule by mouth daily.   ZINC PO Take 1 tablet by mouth daily.       Follow-up Information    Carol Ada, MD. Schedule an appointment as soon as possible for a visit in 1 week(s).   Specialty: Family Medicine Why: with repeat cbc/bmp Contact information: [address]  [place] [address] [phone]        Juanita Craver, MD. Schedule an appointment as soon as possible for a visit in 1 week(s).   Specialty: Gastroenterology Contact information: [address] [place] [address] [phone]          Allergies  Allergen Reactions  . Sulfa Antibiotics Other (See Comments)     Reaction:  Unknown     Consultations:  GI   Procedures/Studies: DG Chest 2 View  Result Date: 02/28/2020 CLINICAL DATA:  Shortness of breath EXAM: CHEST - 2 VIEW COMPARISON:  12/26/2019 FINDINGS: Cardiomegaly. Airspace opacity at the left base suspected, much of which may reflect hiatal hernia. Right lung clear. No effusions. No acute bony abnormality. Severe rightward scoliosis. IMPRESSION: Opacity at the left lung base felt to most likely be related to the left hemidiaphragm and large hiatal hernia. No definite acute process. Cardiomegaly. Electronically Signed   By: Rolm Baptise M.D.   On: 02/28/2020 18:28   ECHOCARDIOGRAM COMPLETE  Result Date: 02/29/2020    ECHOCARDIOGRAM REPORT   Patient Name:   Elaine Rivera Date of Exam: 02/29/2020 Medical Rec #:  [id number]         Height:       60.0 in Accession #:    [id number]        Weight:       119.7 lb Date of Birth:  [birth year]         BSA:          1.501 m Patient Age:    74 years          BP:           135/95 mmHg Patient Gender: F                 HR:           79 bpm. Exam Location:  Inpatient Procedure: 2D Echo, Cardiac Doppler and Color Doppler Indications:    Dyspnea 786.09 / R06.00  History:        Patient has no prior history of Echocardiogram examinations.                 Signs/Symptoms:Shortness of Breath; Risk Factors:Hypertension                 and Non-Smoker. GERD.  Sonographer:    Vickie Epley RDCS Referring Phys: [id number] [place]  1. Left ventricular ejection fraction, by estimation, is 60 to 65%. The left ventricle has normal function. The left ventricle has no regional wall motion abnormalities. Left ventricular diastolic parameters are consistent with Grade I diastolic dysfunction (impaired relaxation).  2. Right ventricular systolic function is normal. The right ventricular size is normal. There is normal pulmonary artery systolic pressure.  3. The mitral valve is normal in structure. Mild mitral valve  regurgitation. No evidence of mitral stenosis.  4. The aortic valve is normal in structure. Aortic valve regurgitation is not visualized. No aortic stenosis is present. FINDINGS  Left Ventricle: Left ventricular ejection fraction, by estimation, is 60 to 65%. The left ventricle has normal function. The left ventricle has no regional wall motion abnormalities. The left ventricular internal cavity size was normal in size. There is  no left ventricular hypertrophy. Left ventricular diastolic parameters are consistent with Grade I diastolic dysfunction (impaired relaxation). Right Ventricle: The right ventricular size is normal. No increase in right ventricular wall thickness. Right ventricular systolic function is normal. There is  normal pulmonary artery systolic pressure. The tricuspid regurgitant velocity is 1.92 m/s, and  with an assumed right atrial pressure of 3 mmHg, the estimated right ventricular systolic pressure is 27.0 mmHg. Left Atrium: Left atrial size was normal in size. Right Atrium: Right atrial size was normal in size. Pericardium: Trivial pericardial effusion is present. Mitral Valve: The mitral valve is normal in structure. Mild mitral valve regurgitation. No evidence of mitral valve stenosis. Tricuspid Valve: The tricuspid valve is normal in structure. Tricuspid valve regurgitation is trivial. No evidence of tricuspid stenosis. Aortic Valve: The aortic valve is normal in structure. Aortic valve regurgitation is not visualized. No aortic stenosis is present. Pulmonic Valve: The pulmonic valve was normal in structure. Pulmonic valve regurgitation is not visualized. No evidence of pulmonic stenosis. Aorta: The aortic root and ascending aorta are structurally normal, with no evidence of dilitation. IAS/Shunts: The atrial septum is grossly normal.  LEFT VENTRICLE PLAX 2D LVIDd:         4.40 cm     Diastology LVIDs:         3.20 cm     LV e' lateral:   8.26 cm/s LV PW:         0.70 cm     LV E/e' lateral:  8.7 LV IVS:        0.70 cm     LV e' medial:    6.16 cm/s LVOT diam:     1.80 cm     LV E/e' medial:  11.7 LV SV:         49 LV SV Index:   33 LVOT Area:     2.54 cm  LV Volumes (MOD) LV vol d, MOD A2C: 71.4 ml LV vol d, MOD A4C: 69.2 ml LV vol s, MOD A2C: 30.7 ml LV vol s, MOD A4C: 28.1 ml LV SV MOD A2C:     40.7 ml LV SV MOD A4C:     69.2 ml LV SV MOD BP:      42.4 ml RIGHT VENTRICLE RV S prime:     11.10 cm/s TAPSE (M-mode): 1.6 cm LEFT ATRIUM             Index       RIGHT ATRIUM          Index LA diam:        3.10 cm 2.07 cm/m  RA Area:     9.01 cm LA Vol (A2C):   14.8 ml 9.86 ml/m  RA Volume:   15.70 ml 10.46 ml/m LA Vol (A4C):   20.9 ml 13.93 ml/m LA Biplane Vol: 19.5 ml 12.99 ml/m  AORTIC VALVE LVOT Vmax:   97.90 cm/s LVOT Vmean:  68.300 cm/s LVOT VTI:    0.193 m  AORTA Ao Root diam: 3.20 cm MITRAL VALVE               TRICUSPID VALVE MV Area (PHT): 4.57 cm    TR Peak grad:   14.7 mmHg MV Decel Time: 166 msec    TR Vmax:        192.00 cm/s MV E velocity: 72.00 cm/s MV A velocity: 85.30 cm/s  SHUNTS MV E/A ratio:  0.84        Systemic VTI:  0.19 m                            Systemic Diam: 1.80 cm Mertie Moores MD Electronically signed by Mertie Moores  MD Signature Date/Time: 02/29/2020/10:57:58 AM    Final      EGD on 03/01/2020 Findings:      A 5 cm hiatal hernia was present.      A benign-appearing, intrinsic mild stenosis was found at the pylorus.       This was traversed.      The examined duodenum was normal.      There was no evidence of significant pathology in the entire examined       portion of jejunum.      Deep intubation of the jejunum was achieved. There was no evidence of       any source of bleeding. Impression:               - 5 cm hiatal hernia.                           - Gastric stenosis was found at the pylorus.                           - Normal examined duodenum.                           - The examined portion of the jejunum was normal.                           -  No specimens collected. Recommendation:           - Return patient to hospital ward for ongoing care.                           - Schedule for a colonoscopy. This procedure can be                            performed as an outpatient.                           - Follow up with Dr. Collene Mares Subjective: Patient seen and examined at bedside.  Denies any overnight black or bloody stools.  Could not finish drinking her colonoscopy prep because of nausea.  No overnight fever.   Discharge Exam: Vitals:   03/01/20 0541 03/01/20 1112  BP: 133/90 (!) 160/92  Pulse: 93 99  Resp: 16 12  Temp: 98.2 F (36.8 C) 98.6 F (37 C)  SpO2: 93% 96%    General: Pt is alert, awake, not in acute distress Cardiovascular: rate controlled, S1/S2 + Respiratory: bilateral decreased breath sounds at bases Abdominal: Soft, NT, ND, bowel sounds + Extremities: no edema, no cyanosis    The results of significant diagnostics from this hospitalization (including imaging, microbiology, ancillary and laboratory) are listed below for reference.     Microbiology: Recent Results (from the past 240 hour(s))  SARS Coronavirus 2 by RT PCR (hospital order, performed in [facility] hospital lab) Nasopharyngeal Nasopharyngeal Swab     Status: None   Collection Time: 02/28/20  5:03 PM   Specimen: Nasopharyngeal Swab  Result Value Ref Range Status   SARS Coronavirus 2 NEGATIVE NEGATIVE Final    Comment: (NOTE) SARS-CoV-2 target nucleic acids are NOT DETECTED. The SARS-CoV-2 RNA is generally detectable in upper and lower respiratory specimens during the acute  phase of infection. The lowest concentration of SARS-CoV-2 viral copies this assay can detect is 250 copies / mL. A negative result does not preclude SARS-CoV-2 infection and should not be used as the sole basis for treatment or other patient management decisions.  A negative result may occur with improper specimen collection / handling, submission of specimen  other than nasopharyngeal swab, presence of viral mutation(s) within the areas targeted by this assay, and inadequate number of viral copies (<250 copies / mL). A negative result must be combined with clinical observations, patient history, and epidemiological information. Fact Sheet for Patients:   StrictlyIdeas.no Fact Sheet for Healthcare Providers: BankingDealers.co.za This test is not yet approved or cleared  by the [place] FDA and has been authorized for detection and/or diagnosis of SARS-CoV-2 by FDA under an Emergency Use Authorization (EUA).  This EUA will remain in effect (meaning this test can be used) for the duration of the COVID-19 declaration under Section 564(b)(1) of the Act, 21 U.S.C. section 360bbb-3(b)(1), unless the authorization is terminated or revoked sooner. Performed at [facility], [place] [address], [place], [place] [address]   Urine culture     Status: Abnormal   Collection Time: 02/28/20  6:05 PM   Specimen: Urine, Random  Result Value Ref Range Status   Specimen Description   Final    URINE, RANDOM Performed at [place] [address], [place], [place] [address]    Special Requests   Final    NONE Performed at [facility], [place] [address], [place], [place] [address]    Culture MULTIPLE SPECIES PRESENT, SUGGEST RECOLLECTION (A)  Final   Report Status 02/29/2020 FINAL  Final     Labs: BNP (last 3 results) Recent Labs    02/28/20 1625  BNP 03.5   Basic Metabolic Panel: Recent Labs  Lab 02/28/20 1625 02/29/20 0540 03/01/20 0525  NA 139 137 142  K 3.3* 3.2* 4.0  CL 102 105 109  CO2 25 24 22   GLUCOSE 121* 100* 100*  BUN 15 11 10   CREATININE 0.76 0.76 0.67  CALCIUM 9.4 8.3* 8.1*  MG  --   --  2.2   Liver Function Tests: Recent Labs  Lab 02/28/20 1625 02/29/20 0540  AST 17 14*  ALT 16 14  ALKPHOS 67 63  BILITOT 0.6 0.9   PROT 7.0 6.0*  ALBUMIN 3.7 3.2*   No results for input(s): LIPASE, AMYLASE in the last 168 hours. No results for input(s): AMMONIA in the last 168 hours. CBC: Recent Labs  Lab 02/28/20 1625 02/29/20 0540 03/01/20 0525  WBC 7.6 5.6 5.9  NEUTROABS 6.1  --  3.9  HGB 6.2* 8.7* 9.0*  HCT 23.8* 29.5* 31.0*  MCV 70.4* 77.2* 78.3*  PLT 437* 338 339   Cardiac Enzymes: No results for input(s): CKTOTAL, CKMB, CKMBINDEX, TROPONINI in the last 168 hours. BNP: Invalid input(s): POCBNP CBG: No results for input(s): GLUCAP in the last 168 hours. D-Dimer Recent Labs    02/28/20 1625  DDIMER 0.39   Hgb A1c No results for input(s): HGBA1C in the last 72 hours. Lipid Profile No results for input(s): CHOL, HDL, LDLCALC, TRIG, CHOLHDL, LDLDIRECT in the last 72 hours. Thyroid function studies Recent Labs    02/28/20 1625  TSH 4.933*   Anemia work up No results for input(s): VITAMINB12, FOLATE, FERRITIN, TIBC, IRON, RETICCTPCT in the last 72 hours. Urinalysis    Component Value Date/Time   COLORURINE AMBER (A) 02/28/2020  [place] (A) 02/28/2020 1805   LABSPEC 1.011 02/28/2020 1805   PHURINE 7.0 02/28/2020 1805   GLUCOSEU NEGATIVE 02/28/2020 1805   HGBUR NEGATIVE 02/28/2020 1805   BILIRUBINUR NEGATIVE 02/28/2020 1805   KETONESUR NEGATIVE 02/28/2020 1805   PROTEINUR NEGATIVE 02/28/2020 1805   UROBILINOGEN 0.2 12/26/2019 1226   NITRITE POSITIVE (A) 02/28/2020 1805   LEUKOCYTESUR SMALL (A) 02/28/2020 1805   Sepsis Labs Invalid input(s): PROCALCITONIN,  WBC,  LACTICIDVEN Microbiology Recent Results (from the past 240 hour(s))  SARS Coronavirus 2 by RT PCR (hospital order, performed in [place] hospital lab) Nasopharyngeal Nasopharyngeal Swab     Status: None   Collection Time: 02/28/20  5:03 PM   Specimen: Nasopharyngeal Swab  Result Value Ref Range Status   SARS Coronavirus 2 NEGATIVE NEGATIVE Final    Comment: (NOTE) SARS-CoV-2 target nucleic acids are  NOT DETECTED. The SARS-CoV-2 RNA is generally detectable in upper and lower respiratory specimens during the acute phase of infection. The lowest concentration of SARS-CoV-2 viral copies this assay can detect is 250 copies / mL. A negative result does not preclude SARS-CoV-2 infection and should not be used as the sole basis for treatment or other patient management decisions.  A negative result may occur with improper specimen collection / handling, submission of specimen other than nasopharyngeal swab, presence of viral mutation(s) within the areas targeted by this assay, and inadequate number of viral copies (<250 copies / mL). A negative result must be combined with clinical observations, patient history, and epidemiological information. Fact Sheet for Patients:   StrictlyIdeas.no Fact Sheet for Healthcare Providers: BankingDealers.co.za This test is not yet approved or cleared  by the [place] FDA and has been authorized for detection and/or diagnosis of SARS-CoV-2 by FDA under an Emergency Use Authorization (EUA).  This EUA will remain in effect (meaning this test can be used) for the duration of the COVID-19 declaration under Section 564(b)(1) of the Act, 21 U.S.C. section 360bbb-3(b)(1), unless the authorization is terminated or revoked sooner. Performed at [facility], [place] [address] [place], [place] [address]   Urine culture     Status: Abnormal   Collection Time: 02/28/20  6:05 PM   Specimen: Urine, Random  Result Value Ref Range Status   Specimen Description   Final    URINE, RANDOM Performed at [place] [facility] [address] [place], [place] [address]    Special Requests   Final    NONE Performed at [facility], [place] [address] [place], [place] [address]    Culture MULTIPLE SPECIES PRESENT, SUGGEST RECOLLECTION (A)  Final   Report Status 02/29/2020 FINAL   Final     Time coordinating discharge: 35 minutes  SIGNED:   Aline August, MD  [facility] 03/01/2020, 11:16 AM

## 2020-03-01 NOTE — Anesthesia Preprocedure Evaluation (Addendum)
Anesthesia Evaluation  Patient identified by MRN, date of birth, ID band Patient awake    Reviewed: Allergy & Precautions, NPO status , Patient's Chart, lab work & pertinent test results  Airway Mallampati: II  TM Distance: >3 FB Neck ROM: Full    Dental  (+) Teeth Intact, Dental Advisory Given   Pulmonary neg pulmonary ROS,    breath sounds clear to auscultation       Cardiovascular hypertension,  Rhythm:Regular Rate:Normal     Neuro/Psych PSYCHIATRIC DISORDERS Depression negative neurological ROS     GI/Hepatic Neg liver ROS, GERD  Medicated,  Endo/Other  negative endocrine ROS  Renal/GU negative Renal ROS     Musculoskeletal  (+) Arthritis ,   Abdominal Normal abdominal exam  (+)   Peds  Hematology negative hematology ROS (+)   Anesthesia Other Findings   Reproductive/Obstetrics                            Anesthesia Physical Anesthesia Plan  ASA: III  Anesthesia Plan: MAC   Post-op Pain Management:    Induction: Intravenous  PONV Risk Score and Plan: 0 and Propofol infusion  Airway Management Planned: Natural Airway and Simple Face Mask  Additional Equipment: None  Intra-op Plan:   Post-operative Plan:   Informed Consent:   Patient has DNR.  Discussed DNR with patient and Suspend DNR.     Plan Discussed with: CRNA  Anesthesia Plan Comments:        Anesthesia Quick Evaluation

## 2020-03-01 NOTE — Plan of Care (Signed)

## 2020-03-04 ENCOUNTER — Other Ambulatory Visit (HOSPITAL_COMMUNITY): Payer: PPO

## 2020-04-09 DIAGNOSIS — K5904 Chronic idiopathic constipation: Secondary | ICD-10-CM | POA: Diagnosis not present

## 2020-04-09 DIAGNOSIS — Z86010 Personal history of colonic polyps: Secondary | ICD-10-CM | POA: Diagnosis not present

## 2020-04-09 DIAGNOSIS — K449 Diaphragmatic hernia without obstruction or gangrene: Secondary | ICD-10-CM | POA: Diagnosis not present

## 2020-04-09 DIAGNOSIS — R1031 Right lower quadrant pain: Secondary | ICD-10-CM | POA: Diagnosis not present

## 2020-05-07 DIAGNOSIS — K449 Diaphragmatic hernia without obstruction or gangrene: Secondary | ICD-10-CM | POA: Diagnosis not present

## 2020-05-07 DIAGNOSIS — K5904 Chronic idiopathic constipation: Secondary | ICD-10-CM | POA: Diagnosis not present

## 2020-05-07 DIAGNOSIS — D509 Iron deficiency anemia, unspecified: Secondary | ICD-10-CM | POA: Diagnosis not present

## 2020-05-10 DIAGNOSIS — F331 Major depressive disorder, recurrent, moderate: Secondary | ICD-10-CM | POA: Diagnosis not present

## 2020-06-10 ENCOUNTER — Telehealth (HOSPITAL_COMMUNITY): Payer: Self-pay

## 2020-06-10 NOTE — Telephone Encounter (Signed)
Called to schedule mri/mra, no answer, left vm. AW  

## 2020-06-13 ENCOUNTER — Encounter (HOSPITAL_COMMUNITY): Payer: Self-pay | Admitting: Emergency Medicine

## 2020-06-13 ENCOUNTER — Emergency Department (HOSPITAL_COMMUNITY)
Admission: EM | Admit: 2020-06-13 | Discharge: 2020-06-13 | Disposition: A | Discharge status: Home / Self Care | Payer: PPO | Attending: Emergency Medicine | Admitting: Emergency Medicine

## 2020-06-13 ENCOUNTER — Emergency Department (HOSPITAL_COMMUNITY): Payer: PPO

## 2020-06-13 DIAGNOSIS — W19XXXA Unspecified fall, initial encounter: Secondary | ICD-10-CM | POA: Insufficient documentation

## 2020-06-13 DIAGNOSIS — G8911 Acute pain due to trauma: Secondary | ICD-10-CM | POA: Diagnosis not present

## 2020-06-13 DIAGNOSIS — M5489 Other dorsalgia: Secondary | ICD-10-CM | POA: Diagnosis not present

## 2020-06-13 DIAGNOSIS — Z5321 Procedure and treatment not carried out due to patient leaving prior to being seen by health care provider: Secondary | ICD-10-CM | POA: Insufficient documentation

## 2020-06-13 DIAGNOSIS — M545 Low back pain: Secondary | ICD-10-CM | POA: Diagnosis not present

## 2020-06-13 DIAGNOSIS — R0902 Hypoxemia: Secondary | ICD-10-CM | POA: Diagnosis not present

## 2020-06-13 DIAGNOSIS — M549 Dorsalgia, unspecified: Secondary | ICD-10-CM | POA: Diagnosis not present

## 2020-06-13 DIAGNOSIS — M546 Pain in thoracic spine: Secondary | ICD-10-CM | POA: Diagnosis not present

## 2020-06-13 DIAGNOSIS — Z743 Need for continuous supervision: Secondary | ICD-10-CM | POA: Diagnosis not present

## 2020-06-13 NOTE — ED Triage Notes (Signed)
Per [facility], coming from home-fell last week injuring back-states increase in pain-wants to be evaluated

## 2020-07-19 DIAGNOSIS — M810 Age-related osteoporosis without current pathological fracture: Secondary | ICD-10-CM | POA: Diagnosis not present

## 2020-11-15 DIAGNOSIS — Z23 Encounter for immunization: Secondary | ICD-10-CM | POA: Diagnosis not present

## 2020-11-15 DIAGNOSIS — E440 Moderate protein-calorie malnutrition: Secondary | ICD-10-CM | POA: Diagnosis not present

## 2020-11-15 DIAGNOSIS — F329 Major depressive disorder, single episode, unspecified: Secondary | ICD-10-CM | POA: Diagnosis not present

## 2020-11-15 DIAGNOSIS — M4124 Other idiopathic scoliosis, thoracic region: Secondary | ICD-10-CM | POA: Diagnosis not present

## 2020-11-15 DIAGNOSIS — E782 Mixed hyperlipidemia: Secondary | ICD-10-CM | POA: Diagnosis not present

## 2020-11-15 DIAGNOSIS — F419 Anxiety disorder, unspecified: Secondary | ICD-10-CM | POA: Diagnosis not present

## 2021-01-17 DIAGNOSIS — F419 Anxiety disorder, unspecified: Secondary | ICD-10-CM | POA: Diagnosis not present

## 2021-01-17 DIAGNOSIS — F332 Major depressive disorder, recurrent severe without psychotic features: Secondary | ICD-10-CM | POA: Diagnosis not present

## 2021-02-11 DIAGNOSIS — M810 Age-related osteoporosis without current pathological fracture: Secondary | ICD-10-CM | POA: Diagnosis not present

## 2021-02-12 DIAGNOSIS — F3341 Major depressive disorder, recurrent, in partial remission: Secondary | ICD-10-CM | POA: Diagnosis not present

## 2021-03-04 ENCOUNTER — Emergency Department (HOSPITAL_COMMUNITY): Payer: PPO

## 2021-03-04 ENCOUNTER — Other Ambulatory Visit: Payer: Self-pay

## 2021-03-04 ENCOUNTER — Inpatient Hospital Stay (HOSPITAL_COMMUNITY)
Admission: EM | Admit: 2021-03-04 | Discharge: 2021-03-10 | DRG: 066 | Disposition: A | Discharge status: Skilled Nursing Facility | Payer: PPO | Attending: Family Medicine | Admitting: Family Medicine

## 2021-03-04 ENCOUNTER — Encounter (HOSPITAL_COMMUNITY): Payer: Self-pay

## 2021-03-04 DIAGNOSIS — S0990XA Unspecified injury of head, initial encounter: Secondary | ICD-10-CM | POA: Diagnosis not present

## 2021-03-04 DIAGNOSIS — F32A Depression, unspecified: Secondary | ICD-10-CM | POA: Diagnosis present

## 2021-03-04 DIAGNOSIS — R29700 NIHSS score 0: Secondary | ICD-10-CM | POA: Diagnosis present

## 2021-03-04 DIAGNOSIS — Z882 Allergy status to sulfonamides status: Secondary | ICD-10-CM

## 2021-03-04 DIAGNOSIS — F419 Anxiety disorder, unspecified: Secondary | ICD-10-CM | POA: Diagnosis present

## 2021-03-04 DIAGNOSIS — R5381 Other malaise: Secondary | ICD-10-CM | POA: Diagnosis not present

## 2021-03-04 DIAGNOSIS — Z7401 Bed confinement status: Secondary | ICD-10-CM | POA: Diagnosis not present

## 2021-03-04 DIAGNOSIS — I639 Cerebral infarction, unspecified: Secondary | ICD-10-CM | POA: Diagnosis present

## 2021-03-04 DIAGNOSIS — R4020 Unspecified coma: Secondary | ICD-10-CM | POA: Diagnosis not present

## 2021-03-04 DIAGNOSIS — W19XXXA Unspecified fall, initial encounter: Secondary | ICD-10-CM | POA: Diagnosis not present

## 2021-03-04 DIAGNOSIS — Z79899 Other long term (current) drug therapy: Secondary | ICD-10-CM

## 2021-03-04 DIAGNOSIS — W1811XA Fall from or off toilet without subsequent striking against object, initial encounter: Secondary | ICD-10-CM | POA: Diagnosis present

## 2021-03-04 DIAGNOSIS — R479 Unspecified speech disturbances: Secondary | ICD-10-CM | POA: Diagnosis not present

## 2021-03-04 DIAGNOSIS — I10 Essential (primary) hypertension: Secondary | ICD-10-CM | POA: Diagnosis present

## 2021-03-04 DIAGNOSIS — F329 Major depressive disorder, single episode, unspecified: Secondary | ICD-10-CM | POA: Diagnosis not present

## 2021-03-04 DIAGNOSIS — K219 Gastro-esophageal reflux disease without esophagitis: Secondary | ICD-10-CM | POA: Diagnosis present

## 2021-03-04 DIAGNOSIS — Z85840 Personal history of malignant neoplasm of eye: Secondary | ICD-10-CM | POA: Diagnosis not present

## 2021-03-04 DIAGNOSIS — F29 Unspecified psychosis not due to a substance or known physiological condition: Secondary | ICD-10-CM | POA: Diagnosis not present

## 2021-03-04 DIAGNOSIS — Z20822 Contact with and (suspected) exposure to covid-19: Secondary | ICD-10-CM | POA: Diagnosis present

## 2021-03-04 DIAGNOSIS — R296 Repeated falls: Secondary | ICD-10-CM | POA: Diagnosis present

## 2021-03-04 DIAGNOSIS — R911 Solitary pulmonary nodule: Secondary | ICD-10-CM | POA: Diagnosis present

## 2021-03-04 DIAGNOSIS — K449 Diaphragmatic hernia without obstruction or gangrene: Secondary | ICD-10-CM | POA: Diagnosis not present

## 2021-03-04 DIAGNOSIS — J984 Other disorders of lung: Secondary | ICD-10-CM | POA: Diagnosis not present

## 2021-03-04 DIAGNOSIS — R569 Unspecified convulsions: Secondary | ICD-10-CM

## 2021-03-04 DIAGNOSIS — E785 Hyperlipidemia, unspecified: Secondary | ICD-10-CM | POA: Diagnosis present

## 2021-03-04 DIAGNOSIS — W19XXXD Unspecified fall, subsequent encounter: Secondary | ICD-10-CM | POA: Diagnosis not present

## 2021-03-04 DIAGNOSIS — E876 Hypokalemia: Secondary | ICD-10-CM | POA: Diagnosis present

## 2021-03-04 DIAGNOSIS — I6621 Occlusion and stenosis of right posterior cerebral artery: Secondary | ICD-10-CM | POA: Diagnosis not present

## 2021-03-04 DIAGNOSIS — R29818 Other symptoms and signs involving the nervous system: Secondary | ICD-10-CM | POA: Diagnosis not present

## 2021-03-04 DIAGNOSIS — D509 Iron deficiency anemia, unspecified: Secondary | ICD-10-CM | POA: Diagnosis present

## 2021-03-04 DIAGNOSIS — I6389 Other cerebral infarction: Secondary | ICD-10-CM | POA: Diagnosis present

## 2021-03-04 DIAGNOSIS — R4701 Aphasia: Secondary | ICD-10-CM | POA: Diagnosis present

## 2021-03-04 DIAGNOSIS — M2550 Pain in unspecified joint: Secondary | ICD-10-CM | POA: Diagnosis not present

## 2021-03-04 DIAGNOSIS — M419 Scoliosis, unspecified: Secondary | ICD-10-CM | POA: Diagnosis not present

## 2021-03-04 DIAGNOSIS — J9811 Atelectasis: Secondary | ICD-10-CM | POA: Diagnosis not present

## 2021-03-04 LAB — COMPREHENSIVE METABOLIC PANEL
ALT: 24 U/L (ref 0–44)
AST: 40 U/L (ref 15–41)
Albumin: 3.8 g/dL (ref 3.5–5.0)
Alkaline Phosphatase: 80 U/L (ref 38–126)
Anion gap: 7 (ref 5–15)
BUN: 10 mg/dL (ref 8–23)
CO2: 25 mmol/L (ref 22–32)
Calcium: 8.3 mg/dL — ABNORMAL LOW (ref 8.9–10.3)
Chloride: 106 mmol/L (ref 98–111)
Creatinine, Ser: 0.65 mg/dL (ref 0.44–1.00)
GFR, Estimated: >60 mL/min (ref >60)
Glucose, Bld: 112 mg/dL — ABNORMAL HIGH (ref 70–99)
Potassium: 2.8 mmol/L — ABNORMAL LOW (ref 3.5–5.1)
Sodium: 138 mmol/L (ref 135–145)
Total Bilirubin: 0.7 mg/dL (ref 0.3–1.2)
Total Protein: 6.8 g/dL (ref 6.5–8.1)

## 2021-03-04 LAB — CBC
HCT: 36.5 % (ref 36.0–46.0)
Hemoglobin: 11.5 g/dL — ABNORMAL LOW (ref 12.0–15.0)
MCH: 25.1 pg — ABNORMAL LOW (ref 26.0–34.0)
MCHC: 31.5 g/dL (ref 30.0–36.0)
MCV: 79.5 fL — ABNORMAL LOW (ref 80.0–100.0)
Platelets: 305 10*3/uL (ref 150–400)
RBC: 4.59 MIL/uL (ref 3.87–5.11)
RDW: 17.3 % — ABNORMAL HIGH (ref 11.5–15.5)
WBC: 8.9 10*3/uL (ref 4.0–10.5)
nRBC: 0 % (ref 0.0–0.2)

## 2021-03-04 MED ORDER — IOHEXOL 350 MG/ML SOLN
100.0000 mL | Freq: Once | INTRAVENOUS | Status: AC | PRN
  Administered 2021-03-04: 100 mL via INTRAVENOUS

## 2021-03-04 MED ORDER — POTASSIUM CHLORIDE 10 MEQ/100ML IV SOLN
10.0000 meq | INTRAVENOUS | Status: AC
  Administered 2021-03-04 – 2021-03-05 (×3): 10 meq via INTRAVENOUS
  Filled 2021-03-04 (×3): qty 100

## 2021-03-04 NOTE — ED Provider Notes (Addendum)
Elaine Rivera is a 75 y.o. female with a history of depression, anemia, hypertension, GERD, brain aneurysm.  Patient presents to the emergency department with a chief complaint of fall and aphasia.  Patient's daughter ,Marye Round, is at bedside and states that her mother lives alone.  Patient's mother suffered a fall on Friday and hit her head.  Patient had no loss of consciousness at that time.  Reports that she speaks to her mother 2-3 times daily.  States that mother had started Remeron 3 weeks prior.  She has seen an increase in her mother's mood and activity level since starting this medication.  States that her mother supposed to use a walker with ambulation, and sometimes does not and has frequent falls  Patient reports that she fell off of the commode at approximately 1800 this afternoon.  Patient is unsure if she hit her head or had any LOC loss of consciousness.  Patient was unable to get herself off of the ground.  Patient reports that she "scooted" around on the floor get to the door and let her friend in.  After her fall patient started having episodes of aphasia.  During these episodes patient states that she is able to understand what is being said to her but feels like her throat is "paralyzed."  Reports having multiple episodes like this since her fall today.  Patient denies any previous episodes.  Endorses headache to the occipital region of her head.  Patient rates pain 5/10 on pain scale.  Patient denies aggravating factors.  Similar headaches in the past.  Headache began after her fall.  Patient denies taking any blood thinners.    HPI     Past Medical History:  Diagnosis Date   Anemia    years ago   Arthritis    Basal cell carcinoma of eye    biopsy of left eye/ non cancerous    Complication of anesthesia    nausea/vomiting   Depression    GERD (gastroesophageal reflux disease)    History of kidney stones    Hypertension    no longer on medications    Patient Active Problem List   Diagnosis Date Noted   Anemia 02/28/2020   Shortness of breath 01/24/2020   Dizziness 01/24/2020   Herpes 10/04/2018   Facial cellulitis 10/03/2018   Brain aneurysm 09/05/2018   Symptomatic anemia 04/20/2016   NSAID long-term use 04/20/2016   Scoliosis 04/20/2016   Depression 04/20/2016   HTN (hypertension) 04/20/2016   GERD (gastroesophageal reflux disease) 04/20/2016   Occult GI bleeding 04/20/2016    Past Surgical History:  Procedure Laterality Date   CEREBRAL ANEURYSM REPAIR     1998   COLONOSCOPY     ECTOPIC PREGNANCY SURGERY     x2    ENTEROSCOPY N/A 03/01/2020   Procedure: ENTEROSCOPY;  Surgeon: Carol Ada, MD;  Location: [facility];  Service: Endoscopy;  Laterality: N/A;   ESOPHAGOGASTRODUODENOSCOPY N/A 04/22/2016   Procedure: ESOPHAGOGASTRODUODENOSCOPY (EGD);  Surgeon: Juanita Craver, MD;  Location: [facility];  Service: Endoscopy;  Laterality: N/A;   IR 3D INDEPENDENT WKST  09/05/2018   IR ANGIO INTRA EXTRACRAN SEL COM CAROTID INNOMINATE BILAT MOD SED  09/05/2018   IR ANGIO VERTEBRAL SEL VERTEBRAL UNI R MOD SED  09/05/2018   IR ANGIOGRAM FOLLOW  UP STUDY  09/05/2018   IR TRANSCATH/EMBOLIZ  09/05/2018   PARATHYROIDECTOMY     2002   RADIOLOGY WITH ANESTHESIA N/A 09/05/2018   Procedure: EMBOLIZATION;  Surgeon: Radiologist, Medication, MD;  Location: [place];  Service: Radiology;  Laterality: N/A;   TONSILLECTOMY     1953   VAGINAL HYSTERECTOMY     2001   VOCAL CORD LATERALIZATION, ENDOSCOPIC APPROACH W/ MLB     2007 (@[facility] )   WRIST SURGERY     left side/2008   WRIST SURGERY     right side/ 2002     OB History   No obstetric history on file.     Family History  Family history unknown: Yes    Social History   Tobacco Use   Smoking status: Never  Smoker   Smokeless tobacco: Never Used  Vaping Use   Vaping Use: Never used  Substance Use Topics   Alcohol use: No   Drug use: Never    Home Medications Prior to Admission medications   Medication Sig Start Date End Date Taking? Authorizing Provider  ALPRAZolam Duanne Moron) 0.5 MG tablet Take 0.5 mg by mouth daily as needed for anxiety.    [provider]  Calcium Carbonate-Vitamin D (CALCIUM 600+D PO) Take 1 tablet by mouth at bedtime.     [provider]  cholecalciferol (VITAMIN D3) 25 MCG (1000 UT) tablet Take 1,000 Units by mouth at bedtime.     [provider]  DULoxetine (CYMBALTA) 60 MG capsule Take 120 mg by mouth at bedtime.    [provider]  Multiple Vitamins-Minerals (ZINC PO) Take 1 tablet by mouth daily.    [provider]  pantoprazole (PROTONIX) 40 MG tablet Take 1 tablet (40 mg total) by mouth daily. 03/01/20 03/31/20  Aline August, MD  TURMERIC PO Take 1 capsule by mouth daily.    [provider]    Allergies    Sulfa antibiotics  Review of Systems   Review of Systems  Constitutional:  Negative for chills and fever.  Eyes:  Negative for visual disturbance.  Respiratory:  Negative for shortness of breath.   Cardiovascular:  Negative for chest pain.  Gastrointestinal:  Positive for nausea. Negative for abdominal pain and vomiting.  Genitourinary:  Negative for difficulty urinating and dysuria.  Musculoskeletal:  Positive for back pain (Baseline lumbar back pain). Negative for neck pain.  Skin:  Negative for color change and rash.  Neurological:  Positive for speech difficulty and headaches. Negative for dizziness, tremors, seizures, syncope, facial asymmetry, weakness, light-headedness and numbness.  Psychiatric/Behavioral:  Negative for confusion.    Physical Exam Updated Vital Signs BP 131/78 (BP Location: Right Arm)   Pulse 85   Ht 4\' 11"  (1.499 m)   Wt 59 kg   SpO2 98%   BMI 26.26 kg/m   Physical  Exam Vitals and nursing note reviewed.  Constitutional:      General: She is not in acute distress.    Appearance: She is not ill-appearing, toxic-appearing or diaphoretic.  HENT:     Head: Normocephalic and atraumatic. No raccoon eyes, Battle's sign, abrasion, contusion, masses, right periorbital erythema, left periorbital erythema or laceration.     Mouth/Throat:     Mouth: Mucous membranes are moist.     Pharynx: Oropharynx is clear. Uvula midline. No pharyngeal swelling, oropharyngeal exudate, posterior oropharyngeal erythema or uvula swelling.  Eyes:     General: No scleral icterus.       Right eye: No discharge.  Left eye: No discharge.     Extraocular Movements: Extraocular movements intact.     Conjunctiva/sclera:     Right eye: Right conjunctiva is not injected. No chemosis, exudate or hemorrhage.    Left eye: Left conjunctiva is not injected. No chemosis, exudate or hemorrhage.    Pupils: Pupils are equal, round, and reactive to light.  Cardiovascular:     Rate and Rhythm: Normal rate.  Pulmonary:     Effort: Pulmonary effort is normal. No tachypnea, bradypnea or respiratory distress.     Breath sounds: Normal breath sounds. No stridor.  Abdominal:     General: There is no distension. There are no signs of injury.     Palpations: Abdomen is soft. There is no mass or pulsatile mass.     Tenderness: There is no abdominal tenderness. There is no guarding or rebound.  Musculoskeletal:     Cervical back: Normal range of motion and neck supple. No swelling, edema, deformity, erythema, signs of trauma, lacerations, rigidity, spasms, torticollis, tenderness, bony tenderness or crepitus. No pain with movement, spinous process tenderness or muscular tenderness. Normal range of motion.     Thoracic back: No swelling, edema, deformity, signs of trauma, lacerations, spasms, tenderness or bony tenderness. Scoliosis present.     Lumbar back: Tenderness present. No swelling, edema,  deformity, signs of trauma, lacerations, spasms or bony tenderness. Scoliosis present.     Comments: No midline tenderness or deformity to cervical, thoracic, or lumbar spine.  Patient endorses tenderness to bilateral lumbar back, however states this is baseline due to her scoliosis.  No tenderness, bony tenderness, or deformity to bilateral upper and lower extremities  Skin:    General: Skin is warm and dry.  Neurological:     General: No focal deficit present.     Mental Status: She is alert and oriented to person, place, and time.     GCS: GCS eye subscore is 4. GCS verbal subscore is 5. GCS motor subscore is 6.     Cranial Nerves: No cranial nerve deficit or facial asymmetry.     Sensory: Sensation is intact.     Motor: No weakness, tremor, seizure activity or pronator drift.     Coordination: Finger-Nose-Finger Test normal.     Comments: CN II-XII intact; performed in supine position, +5 strength to bilateral upper extremities, +5 strength to dorsiflexion and plantarflexion, patient able to left both legs against gravity and hold each there without difficulty.  Sensation to light touch intact to bilateral upper and lower extremities.  Psychiatric:        Behavior: Behavior is cooperative.    ED Results / Procedures / Treatments   Labs (all labs ordered are listed, but only abnormal results are displayed) Labs Reviewed  COMPREHENSIVE METABOLIC PANEL - Abnormal; Notable for the following components:      Result Value   Potassium 2.8 (*)    Glucose, Bld 112 (*)    Calcium 8.3 (*)    All other components within normal limits  CBC - Abnormal; Notable for the following components:   Hemoglobin 11.5 (*)    MCV 79.5 (*)    MCH 25.1 (*)    RDW 17.3 (*)    All other components within normal limits    EKG None  Radiology CT Angio Head W or Wo Contrast  Result Date: 03/05/2021 CLINICAL DATA:  Initial evaluation for neuro deficit. EXAM: CT ANGIOGRAPHY HEAD AND NECK TECHNIQUE:  Multidetector CT imaging of the head and neck  was performed using the standard protocol during bolus administration of intravenous contrast. Multiplanar CT image reconstructions and MIPs were obtained to evaluate the vascular anatomy. Carotid stenosis measurements (when applicable) are obtained utilizing NASCET criteria, using the distal internal carotid diameter as the denominator. CONTRAST:  128mL OMNIPAQUE IOHEXOL 350 MG/ML SOLN COMPARISON:  Prior CT from earlier the same day. FINDINGS: CTA NECK FINDINGS Aortic arch: Visualized arch normal caliber with normal 3 vessel morphology. Mild atheromatous change about the arch itself. No significant stenosis about the origin of the great vessels. Right carotid system: Right common and internal carotid arteries widely patent without stenosis, dissection or occlusion. Left carotid system: Left common and internal carotid arteries widely patent without stenosis, dissection or occlusion. Vertebral arteries: Both vertebral arteries arise from the subclavian arteries. Right vertebral artery dominant. No significant proximal subclavian artery stenosis. Vertebral arteries patent within the neck without stenosis, dissection or occlusion. Skeleton: No visible acute osseous finding. No discrete or worrisome osseous lesions. Severe thoracic scoliosis partially visualized. Other neck: No other acute soft tissue abnormality within the neck. Probable right vocal cord paresis noted. Upper chest: Scattered atelectatic changes noted within the partially visualized left lower lobe. 7 mm nodular density partially visualized within the right lower lobe (series 1, image 1). Linear atelectasis versus scarring present at the right lung apex. Suspected hiatal hernia partially visualized. Review of the MIP images confirms the above findings CTA HEAD FINDINGS Anterior circulation: Petrous, cavernous, and supraclinoid segments widely patent without stenosis. A1 segments patent bilaterally. Normal  anterior communicating artery complex. Anterior cerebral arteries patent to their distal aspects without stenosis. No M1 stenosis or occlusion. Normal MCA bifurcations. Distal MCA branches well perfused and symmetric. Posterior circulation: Pipeline stent in place within the right V4 segment, extending from the dural reflection to the vertebrobasilar junction. Patent flow seen through the stent. 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 Residual aneurysm filling measures 6 x 7 x 6 mm, previously 6 x 6 x 6 mm on prior CTA from 10/03/2018. Hypoplastic left vertebral artery remains widely patent. Left PICA origin patent and normal. Basilar widely patent. Superior cerebellar arteries patent bilaterally. Both PCA supplied via the basilar as well as bilateral posterior communicating arteries, larger on the right. PCAs remain well perfused to their distal aspects. Venous sinuses: Patent allowing for timing of the contrast bolus. Anatomic variants: Mildly hypoplastic right P1 segment with robust right posterior communicating artery. No new aneurysm. Review of the MIP images confirms the above findings IMPRESSION: 1. Negative CTA for large vessel occlusion. 2. Sequelae of prior endovascular treatment of right PICA aneurysm with pipeline stent in place within right V4 segment, stable in position. Residual aneurysm filling measuring 6 x 7 x 6 mm, little interval changed, previously measuring 6 x 6 x 6 mm on CTA from 10/03/2018. 3. Otherwise stable and negative CTA of the head and neck. No other hemodynamically significant or correctable stenosis. 4. 7 mm right lower lobe nodule, partially visualized. Further evaluation with dedicated cross-sectional imaging of the chest suggested for further evaluation. Electronically Signed   By: Jeannine Boga M.D.   On: 03/05/2021 00:00   CT Head Wo Contrast  Result Date: 03/04/2021 CLINICAL DATA:  Dizziness, fall, head injury EXAM: CT HEAD WITHOUT CONTRAST TECHNIQUE:  Contiguous axial images were obtained from the base of the skull through the vertex without intravenous contrast. COMPARISON:  MRI 11/17/2019 FINDINGS: Brain: Right suboccipital craniotomy has been performed. A hyperdense mass is seen within the right cerebellum medullary angle measuring  1.5 x 2.1 cm at axial image # 7/2 compatible with the known PICA aneurysm and demonstrating interval growth since prior MRI examination where this measured roughly 12 mm x 18 mm. A vascular stent compatible with a pipeline stent is seen within the right V4 segment of the vertebral artery extending to the origin of the basilar artery. No evidence of acute intracranial hemorrhage or infarct. No abnormal mass effect or midline shift. No abnormal intra or extra-axial fluid collection identified. There is mild ventriculomegaly again identified which appears disproportionate to the degree of cerebral atrophy suggesting underlying communicating hydrocephalus. Mild parenchymal volume loss is again noted. Moderate periventricular white matter changes are again seen, possibly related to small vessel ischemic change. Vascular: No asymmetric hyperdense vasculature at the skull base. Skull: There is no acute fracture identified. Sinuses/Orbits: There is partial opacification of the right sphenoid sinus. Mucous retention cyst noted within the left maxillary sinus. Remaining paranasal sinuses are clear. Orbits are unremarkable. Other: Mastoid air cells and middle ear cavities are clear. 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 The aneurysm sac appears enlarged versus remote prior examination, though intervening examines are not available to determine interval change prior to endovascular therapy. The aneurysm sac demonstrates interval increase in size since prior examination measuring 21 mm in greatest dimension. Patency of the aneurysm sac is not well assessed on this examination and would be better assessed with MRI  or formal arteriography. Stable mild ventriculomegaly disproportionate to the degree of cerebral atrophy. Clinical correlation for signs and symptoms of normal pressure hydrocephalus may be helpful. No acute intracranial hemorrhage or infarct. Stable mild to moderate senescent change. Mild paranasal sinus disease. Electronically Signed   By: Fidela Salisbury MD   On: 03/04/2021 20:31   CT Angio Neck W and/or Wo Contrast  Result Date: 03/05/2021 CLINICAL DATA:  Initial evaluation for neuro deficit. EXAM: CT ANGIOGRAPHY HEAD AND NECK TECHNIQUE: Multidetector CT imaging of the head and neck was performed using the standard protocol during bolus administration of intravenous contrast. Multiplanar CT image reconstructions and MIPs were obtained to evaluate the vascular anatomy. Carotid stenosis measurements (when applicable) are obtained utilizing NASCET criteria, using the distal internal carotid diameter as the denominator. CONTRAST:  120mL OMNIPAQUE IOHEXOL 350 MG/ML SOLN COMPARISON:  Prior CT from earlier the same day. FINDINGS: CTA NECK FINDINGS Aortic arch: Visualized arch normal caliber with normal 3 vessel morphology. Mild atheromatous change about the arch itself. No significant stenosis about the origin of the great vessels. Right carotid system: Right common and internal carotid arteries widely patent without stenosis, dissection or occlusion. Left carotid system: Left common and internal carotid arteries widely patent without stenosis, dissection or occlusion. Vertebral arteries: Both vertebral arteries arise from the subclavian arteries. Right vertebral artery dominant. No significant proximal subclavian artery stenosis. Vertebral arteries patent within the neck without stenosis, dissection or occlusion. Skeleton: No visible acute osseous finding. No discrete or worrisome osseous lesions. Severe thoracic scoliosis partially visualized. Other neck: No other acute soft tissue abnormality within the neck.  Probable right vocal cord paresis noted. Upper chest: Scattered atelectatic changes noted within the partially visualized left lower lobe. 7 mm nodular density partially visualized within the right lower lobe (series 1, image 1). Linear atelectasis versus scarring present at the right lung apex. Suspected hiatal hernia partially visualized. Review of the MIP images confirms the above findings CTA HEAD FINDINGS Anterior circulation: Petrous, cavernous, and supraclinoid segments widely patent without stenosis. A1 segments patent bilaterally. Normal anterior  communicating artery complex. Anterior cerebral arteries patent to their distal aspects without stenosis. No M1 stenosis or occlusion. Normal MCA bifurcations. Distal MCA branches well perfused and symmetric. Posterior circulation: Pipeline stent in place within the right V4 segment, extending from the dural reflection to the vertebrobasilar junction. Patent flow seen through the stent. 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 Residual aneurysm filling measures 6 x 7 x 6 mm, previously 6 x 6 x 6 mm on prior CTA from 10/03/2018. Hypoplastic left vertebral artery remains widely patent. Left PICA origin patent and normal. Basilar widely patent. Superior cerebellar arteries patent bilaterally. Both PCA supplied via the basilar as well as bilateral posterior communicating arteries, larger on the right. PCAs remain well perfused to their distal aspects. Venous sinuses: Patent allowing for timing of the contrast bolus. Anatomic variants: Mildly hypoplastic right P1 segment with robust right posterior communicating artery. No new aneurysm. Review of the MIP images confirms the above findings IMPRESSION: 1. Negative CTA for large vessel occlusion. 2. Sequelae of prior endovascular treatment of right PICA aneurysm with pipeline stent in place within right V4 segment, stable in position. Residual aneurysm filling measuring 6 x 7 x 6 mm, little interval changed,  previously measuring 6 x 6 x 6 mm on CTA from 10/03/2018. 3. Otherwise stable and negative CTA of the head and neck. No other hemodynamically significant or correctable stenosis. 4. 7 mm right lower lobe nodule, partially visualized. Further evaluation with dedicated cross-sectional imaging of the chest suggested for further evaluation. Electronically Signed   By: Jeannine Boga M.D.   On: 03/05/2021 00:00    Procedures .Critical Care  Date/Time: 03/07/2021 7:35 PM Performed by: Loni Beckwith, PA-C Authorized by: Loni Beckwith, PA-C   Critical care provider statement:    Critical care time (minutes):  45   Critical care was necessary to treat or prevent imminent or life-threatening deterioration of the following conditions:  CNS failure or compromise and metabolic crisis   Critical care was time spent personally by me on the following activities:  Discussions with consultants, examination of patient, ordering and performing treatments and interventions, ordering and review of laboratory studies, ordering and review of radiographic studies, pulse oximetry, re-evaluation of patient's condition, obtaining history from patient or surrogate and review of old charts   Medications Ordered in ED Medications  potassium chloride 10 mEq in 100 mL IVPB (10 mEq Intravenous New Bag/Given 03/04/21 2229)  iohexol (OMNIPAQUE) 350 MG/ML injection 100 mL (100 mLs Intravenous Contrast Given 03/04/21 2232)    ED Course  I have reviewed the triage vital signs and the nursing notes.  Pertinent labs & imaging results that were available during my care of the patient were reviewed by me and considered in my medical decision making (see chart for details).    MDM Rules/Calculators/A&P                          Alert 74 year old female no acute distress, nontoxic-appearing.  Patient presents with a chief complaint of fall and aphasia.  Fall occurred at approximately 1800.  Patient unsure if she hit  her head or had any loss of consciousness.  Patient was unable to stand up after this fall and crawled over to door to allow her friend to get into the house.  Is following patient has been having episodes of aphasia.  Patient is unsure of any episode she has had.  During interview patient has 1 such episode.  Present lasted 1 to 2 minutes.  Patient started blinking rapidly and stopped responding to provider.  Within a few seconds patient was able to focus on provider and started to follow commands.  After episode patient was able to speak and move all limbs without any difficulty.  Patient was discussed with and evaluated by Dr. Eulis Foster.  BMP, CBC, and noncontrast CT head were obtained while patient was in triage.  BMP showed hypokalemia at 2.8.  Likely secondary to decreased oral intake.  Will replace with IV potassium x3.  Glucose at 112. CBC shows hemoglobin at 11.5  Noncontrast head CT showed aneurysm sac demonstrated interval increase in size since prior examination measuring 21 mm in greatest dimension.  Patency of the aneurysm sac is not well assessed on this examination and would be better assessed with MRI or formal arteriography.  No acute intracranial hemorrhage or infarct. Stable mild ventriculomegaly disproportionate to the degree of cerebral atrophy  Due to patient's episodes of aphasia will consult on-call neurology.  2158 spoke to on-call neurologist Dr. Lorrin Goodell who recommended that patient have CTA of head and neck as well as MRI performed.  Patient will also need EEG.  If MRI is obtained overnight then patient can get EEG in outpatient setting.  2200 reexamination had another episode aphasia.  Lasted 1 minute.  Patient was able to maintain eye contact during this episode.  Patient was able to follow commands.  Patient able to move all limbs equally without difficulty.  CTA negative for large vessel occlusion. Sequelae of prior endovascular treatment of right PICA aneurysm with  pipeline stent in place within right V4 segment, stable in position. Residual aneurysm filling measuring 6 x 7 x 6 mm, little interval changed, previously measuring 6 x 6 x 6 mm on CTA from 10/03/2018.  7 mm right lower lung lobe nodule, partially visualized. Further evaluation with dedicated cross-sectional imaging of the chest suggested for further evaluation.  Patient continues to have no focal neurological deficits on serial reexamination.  MRI pending at this time.  If MRI is unremarkable we will give patient ambulatory referral to neurology for follow-up.  Will need EEG in outpatient setting.  We will have patient follow-up with primary care provider for repeat potassium testing as well as to schedule outpatient CT to further evaluate right lower lung lobe nodule.  Patient care transferred to PA [place] at the end of my shift. Patient presentation, ED course, and plan of care discussed with review of all pertinent labs and imaging. Please see his/her note for further details regarding further ED course and disposition.  Final Clinical Impression(s) / ED Diagnoses Final diagnoses:  None    Rx / DC Orders ED Discharge Orders     None        Dyann Ruddle 03/05/21 0055    Daleen Bo, MD 03/05/21 1122    Loni Beckwith, PA-C 03/07/21 1936    Daleen Bo, MD 03/08/21 [phone]

## 2021-03-04 NOTE — ED Notes (Signed)
Pt was speaking and answering questions to RN. She suddenly stopped and starred off. She was unable to speak for about 2 minutes. She then began speaking where the conversation last stopped before the episode began.

## 2021-03-04 NOTE — ED Notes (Addendum)
Radiology tech reports that pt had another episode with her where she was speaking and suddenly stopped. Pt maintained eye contact but was not verbal. That episode lasted approximately 2 minutes.

## 2021-03-04 NOTE — ED Notes (Addendum)
Pt transported to MRI 

## 2021-03-04 NOTE — ED Provider Notes (Signed)
  Face-to-face evaluation   History: She presents for evaluation of weakness manifested by difficulty getting off the commode, so she crawled to the door.  She apparently summoned EMS who transferred her here.  After arrival her daughter arrived.  Daughter states that the patient has been more energetic since starting on Remeron about 3 weeks ago.  Is unclear why the Remeron was started.  Patient has been having trouble sleeping, ongoing for several months.  She was previously on Risperdal however since starting Remeron she has had much more energy allowing her to be more  active.  Her daughter feels that at this time, the patient has "overdone it," and is a retired because she has been more active recently.  The patient has difficulty giving history.  The patient did not fall today but apparently did fall, 5 days ago without any specific injury seen at that time.  She is not currently actively seeing a neurologist or neurosurgeon following her intracranial aneurysm coiling procedure.  Apparently the patient has had some periods of difficulty talking, multiple times today.  These are brief, and are not accompanied by her being unaware of her surroundings during that time when she cannot talk.  No other recent illnesses or problems are known.  Physical exam: Alert and cooperative.  No dysarthria or aphasia, during the 10 minutes I spent with her.  She is lucid.  Medical screening examination/treatment/procedure(s) were conducted as a shared visit with non-physician practitioner(s) and myself.  I personally evaluated the patient during the encounter    Daleen Bo, MD 03/05/21 1121

## 2021-03-04 NOTE — ED Triage Notes (Signed)
Pt BIB EMS from home. Pt reports fall after feeling dizzy and had to crawl to the door to let her friend in. Her friend reports she has been having increasing episodes when she will "zone out" and seem absent. Then become active at baseline. EMS witnessed 2 similar episodes.   Pt friend reports pt has long term poor intake. Pt has had little intake today.

## 2021-03-04 NOTE — ED Notes (Addendum)
Daughter confirms pt fell on Friday. Pt started remeron approximately 3 weeks ago.

## 2021-03-05 ENCOUNTER — Observation Stay (HOSPITAL_BASED_OUTPATIENT_CLINIC_OR_DEPARTMENT_OTHER): Payer: PPO

## 2021-03-05 ENCOUNTER — Observation Stay (HOSPITAL_COMMUNITY): Payer: PPO

## 2021-03-05 ENCOUNTER — Encounter (HOSPITAL_COMMUNITY): Payer: Self-pay | Admitting: Family Medicine

## 2021-03-05 DIAGNOSIS — F419 Anxiety disorder, unspecified: Secondary | ICD-10-CM

## 2021-03-05 DIAGNOSIS — I6389 Other cerebral infarction: Secondary | ICD-10-CM

## 2021-03-05 DIAGNOSIS — R4701 Aphasia: Secondary | ICD-10-CM | POA: Insufficient documentation

## 2021-03-05 DIAGNOSIS — F32A Depression, unspecified: Secondary | ICD-10-CM

## 2021-03-05 DIAGNOSIS — R479 Unspecified speech disturbances: Secondary | ICD-10-CM

## 2021-03-05 DIAGNOSIS — W19XXXA Unspecified fall, initial encounter: Secondary | ICD-10-CM

## 2021-03-05 DIAGNOSIS — E876 Hypokalemia: Secondary | ICD-10-CM | POA: Diagnosis present

## 2021-03-05 DIAGNOSIS — R569 Unspecified convulsions: Secondary | ICD-10-CM

## 2021-03-05 DIAGNOSIS — I639 Cerebral infarction, unspecified: Secondary | ICD-10-CM | POA: Diagnosis present

## 2021-03-05 DIAGNOSIS — R911 Solitary pulmonary nodule: Secondary | ICD-10-CM

## 2021-03-05 LAB — RESP PANEL BY RT-PCR (FLU A&B, COVID) ARPGX2
Influenza A by PCR: NEGATIVE
Influenza B by PCR: NEGATIVE
SARS Coronavirus 2 by RT PCR: NEGATIVE

## 2021-03-05 LAB — LIPID PANEL
Cholesterol: 175 mg/dL (ref 0–200)
HDL: 60 mg/dL (ref >40)
LDL Cholesterol: 101 mg/dL — ABNORMAL HIGH (ref 0–99)
Total CHOL/HDL Ratio: 2.9 RATIO
Triglycerides: 68 mg/dL (ref <150)
VLDL: 14 mg/dL (ref 0–40)

## 2021-03-05 LAB — BASIC METABOLIC PANEL
Anion gap: 8 (ref 5–15)
BUN: 6 mg/dL — ABNORMAL LOW (ref 8–23)
CO2: 23 mmol/L (ref 22–32)
Calcium: 7.3 mg/dL — ABNORMAL LOW (ref 8.9–10.3)
Chloride: 108 mmol/L (ref 98–111)
Creatinine, Ser: 0.54 mg/dL (ref 0.44–1.00)
GFR, Estimated: >60 mL/min (ref >60)
Glucose, Bld: 94 mg/dL (ref 70–99)
Potassium: 3 mmol/L — ABNORMAL LOW (ref 3.5–5.1)
Sodium: 139 mmol/L (ref 135–145)

## 2021-03-05 LAB — ECHOCARDIOGRAM COMPLETE
AR max vel: 2.26 cm2
AV Area VTI: 2.41 cm2
AV Area mean vel: 2.19 cm2
AV Mean grad: 4 mmHg
AV Peak grad: 7.1 mmHg
Ao pk vel: 1.33 m/s
Area-P 1/2: 3.48 cm2
Height: 59 in
MV VTI: 1.77 cm2
S' Lateral: 2.5 cm
Weight: 2080 oz

## 2021-03-05 LAB — MAGNESIUM: Magnesium: 2.2 mg/dL (ref 1.7–2.4)

## 2021-03-05 MED ORDER — ACETAMINOPHEN 325 MG PO TABS
650.0000 mg | ORAL_TABLET | ORAL | Status: DC | PRN
  Administered 2021-03-09: 650 mg via ORAL
  Filled 2021-03-05 (×2): qty 2

## 2021-03-05 MED ORDER — MAGNESIUM SULFATE IN D5W 1-5 GM/100ML-% IV SOLN
1.0000 g | Freq: Once | INTRAVENOUS | Status: AC
  Administered 2021-03-05: 1 g via INTRAVENOUS
  Filled 2021-03-05: qty 100

## 2021-03-05 MED ORDER — POTASSIUM CHLORIDE 10 MEQ/100ML IV SOLN
10.0000 meq | INTRAVENOUS | Status: AC
  Administered 2021-03-05 (×3): 10 meq via INTRAVENOUS
  Filled 2021-03-05 (×3): qty 100

## 2021-03-05 MED ORDER — POTASSIUM CHLORIDE IN NACL 20-0.9 MEQ/L-% IV SOLN
INTRAVENOUS | Status: AC
  Filled 2021-03-05: qty 1000

## 2021-03-05 MED ORDER — LORAZEPAM 2 MG/ML IJ SOLN
1.0000 mg | INTRAMUSCULAR | Status: DC | PRN
  Administered 2021-03-05: 1 mg via INTRAVENOUS
  Filled 2021-03-05: qty 1

## 2021-03-05 MED ORDER — LEVETIRACETAM IN NACL 1000 MG/100ML IV SOLN
1000.0000 mg | Freq: Once | INTRAVENOUS | Status: AC
  Administered 2021-03-05: 1000 mg via INTRAVENOUS
  Filled 2021-03-05: qty 100

## 2021-03-05 MED ORDER — SODIUM CHLORIDE 0.9 % IV SOLN: 20.0000 mg/kg | Freq: Once | INTRAVENOUS | Status: DC

## 2021-03-05 MED ORDER — ACETAMINOPHEN 160 MG/5ML PO SOLN
650.0000 mg | ORAL | Status: DC | PRN
Start: 2021-03-05 — End: 2021-03-10

## 2021-03-05 MED ORDER — POTASSIUM CHLORIDE 20 MEQ PO PACK: 40.0000 meq | PACK | Freq: Once | ORAL | Status: DC

## 2021-03-05 MED ORDER — STROKE: EARLY STAGES OF RECOVERY BOOK
Freq: Once | Status: DC
  Filled 2021-03-05: qty 1

## 2021-03-05 MED ORDER — SENNOSIDES-DOCUSATE SODIUM 8.6-50 MG PO TABS: 1.0000 | ORAL_TABLET | Freq: Every evening | ORAL | Status: DC | PRN

## 2021-03-05 MED ORDER — POTASSIUM CHLORIDE CRYS ER 20 MEQ PO TBCR
20.0000 meq | EXTENDED_RELEASE_TABLET | Freq: Once | ORAL | Status: AC
  Administered 2021-03-05: 20 meq via ORAL
  Filled 2021-03-05: qty 1

## 2021-03-05 MED ORDER — LEVETIRACETAM IN NACL 500 MG/100ML IV SOLN
500.0000 mg | Freq: Two times a day (BID) | INTRAVENOUS | Status: DC
  Administered 2021-03-05 – 2021-03-06 (×3): 500 mg via INTRAVENOUS
  Filled 2021-03-05 (×3): qty 100

## 2021-03-05 MED ORDER — ACETAMINOPHEN 650 MG RE SUPP: 650.0000 mg | RECTAL | Status: DC | PRN

## 2021-03-05 MED ORDER — ENOXAPARIN SODIUM 40 MG/0.4ML IJ SOSY
40.0000 mg | PREFILLED_SYRINGE | INTRAMUSCULAR | Status: DC
  Administered 2021-03-05 – 2021-03-10 (×6): 40 mg via SUBCUTANEOUS
  Filled 2021-03-05 (×6): qty 0.4

## 2021-03-05 NOTE — H&P (Signed)
History and Physical    Elaine [facility] DOA: 03/04/2021  PCP: Elaine Ada, MD   Patient coming from: Home   Chief Complaint: Fall, weakness, episodes of speech difficulty   HPI: Elaine Rivera is a 75 y.o. 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  Patient reportedly fell and hit her head on 02/28/2021 without losing consciousness at that time but has been experiencing headaches.  She then reports falling off the commode at approximately 6 PM yesterday, felt too weak to get up, but was eventually able to let a friend into her home.  Her friend noted that the patient was having recurrent episodes where she was unable to speak.  EMS was called and the patient was transported to the [facility].  Patient's daughter reported that the patient was started on Remeron approximately 3 weeks ago and has had elevated mood and has been sleeping less since then. In the ED, the patient has difficultly recalling the events from earlier today. She denies any recent fever, chills, chest pain, or palpitations. She remembers experiencing an occipital headache earlier that has since resolved.   ED Course: Upon arrival to the ED, patient is found to be saturating well on room air and with normal heart rate and blood pressure.  Chemistry panel notable for potassium 2.8.  CBC with mild microcytic anemia.  MRI brain with 12 mm linear focus of diffusion abnormality involving the right cerebellum suspicious for small subacute ischemic infarction.  CTA head and neck was negative for large vessel occlusion but notable for a partially visualized lung nodule and prior right PICA aneurysm treatment with stent in stable position and little interval change in the residual  aneurysm.  Patient was treated with 30 mill equivalents IV potassium in the ED.  Neurology was consulted by the ED physician and medical admission to [facility] for further evaluation and management was recommended.  Review of Systems:  Negative aside from what is noted in HPI.  Past Medical History:  Diagnosis Date  . Anemia    years ago  . Arthritis   . Basal cell carcinoma of eye    biopsy of left eye/ non cancerous  . Complication of anesthesia    nausea/vomiting  . Depression   . GERD (gastroesophageal reflux disease)   . History of kidney stones   . Hypertension    no longer on medications    Past Surgical History:  Procedure Laterality Date  . CEREBRAL ANEURYSM REPAIR     1998  . COLONOSCOPY    . ECTOPIC PREGNANCY SURGERY     x2   . ENTEROSCOPY N/A 03/01/2020   Procedure: ENTEROSCOPY;  Surgeon: Elaine Ada, MD;  Location: [facility];  Service: Endoscopy;  Laterality: N/A;  . ESOPHAGOGASTRODUODENOSCOPY N/A 04/22/2016   Procedure: ESOPHAGOGASTRODUODENOSCOPY (EGD);  Surgeon: Elaine Craver, MD;  Location: [facility];  Service: Endoscopy;  Laterality: N/A;  . IR 3D INDEPENDENT WKST  09/05/2018  . IR ANGIO INTRA EXTRACRAN SEL COM CAROTID INNOMINATE BILAT MOD SED  09/05/2018  . IR ANGIO VERTEBRAL SEL VERTEBRAL UNI R MOD SED  09/05/2018  . IR ANGIOGRAM FOLLOW UP STUDY  09/05/2018  . IR TRANSCATH/EMBOLIZ  09/05/2018  . PARATHYROIDECTOMY     2002  . RADIOLOGY WITH ANESTHESIA N/A 09/05/2018   Procedure: EMBOLIZATION;  Surgeon: Radiologist, Medication, MD;  Location: [facility];  Service: Radiology;  Laterality: N/A;  . TONSILLECTOMY     1953  . VAGINAL HYSTERECTOMY     2001  . VOCAL CORD LATERALIZATION, ENDOSCOPIC APPROACH W/ MLB     2007 (@[facility] )  . WRIST SURGERY     left side/2008  . WRIST SURGERY     right side/ 2002    Social History:   reports that she has never smoked. She has never used smokeless tobacco. She reports that she does not drink alcohol and does not  use drugs.  Allergies  Allergen Reactions  . Sulfa Antibiotics Other (See Comments)    Reaction:  Unknown     Family History  Family history unknown: Yes     Prior to Admission medications   Medication Sig Start Date End Date Taking? Authorizing Provider  ALPRAZolam [facility]) 0.5 MG tablet Take 0.5 mg by mouth daily as needed for anxiety.    [provider]  Calcium Carbonate-Vitamin D (CALCIUM 600+D PO) Take 1 tablet by mouth at bedtime.     [provider]  cholecalciferol (VITAMIN D3) 25 MCG (1000 UT) tablet Take 1,000 Units by mouth at bedtime.     [provider]  DULoxetine (CYMBALTA) 60 MG capsule Take 120 mg by mouth at bedtime.    [provider]  Multiple Vitamins-Minerals (ZINC PO) Take 1 tablet by mouth daily.    [provider]  pantoprazole (PROTONIX) 40 MG tablet Take 1 tablet (40 mg total) by mouth daily. 03/01/20 03/31/20  Elaine August, MD  TURMERIC PO Take 1 capsule by mouth daily.    [provider]    Physical Exam: Vitals:   03/04/21 2230 03/05/21 0021 03/05/21 0100 03/05/21 0130  BP: 119/68 126/69 116/69 123/63  Pulse: 68 64 65 65  Resp: 19 17 15 13   SpO2: 98% 96% 90% 97%  Weight:      Height:         Constitutional: NAD, calm  Eyes: PERTLA, lids and conjunctivae normal ENMT: Mucous membranes are moist. Posterior pharynx clear of any exudate or lesions.   Neck: supple, no masses  Respiratory:  no wheezing, no crackles. No accessory muscle use.  Cardiovascular: S1 & S2 heard, regular rate and rhythm. No extremity edema.   Abdomen: No distension, no tenderness, soft. Bowel sounds active.  Musculoskeletal: no clubbing / cyanosis. No joint deformity upper and lower extremities.   Skin: no significant rashes, lesions, ulcers. Warm, dry, well-perfused. Neurologic: CN 2-12 grossly intact. Sensation intact. Strength 5/5 in all 4 limbs. Recurrent episodes of expressive aphasia with maintenance or awareness and  rapid blinking bilaterally.  Psychiatric: Alert. Oriented to person, place, and situation. Calm and cooperative.    Labs and Imaging on Admission: I have personally reviewed following labs and imaging studies  CBC: Recent Labs  Lab 03/04/21 1924  WBC 8.9  HGB 11.5*  HCT 36.5  MCV 79.5*  PLT 024   Basic Metabolic Panel: Recent Labs  Lab 03/04/21 1924  NA 138  K 2.8*  CL 106  CO2 25  GLUCOSE 112*  BUN 10  CREATININE 0.65  CALCIUM 8.3*   GFR: Estimated Creatinine Clearance: 47.5 mL/min (by C-G formula based on SCr of 0.65 mg/dL). Liver Function Tests: Recent Labs  Lab 03/04/21 1924  AST 40  ALT 24  ALKPHOS 80  BILITOT 0.7  PROT 6.8  ALBUMIN 3.8   No results for input(s): LIPASE, AMYLASE in the last 168  hours. No results for input(s): AMMONIA in the last 168 hours. Coagulation Profile: No results for input(s): INR, PROTIME in the last 168 hours. Cardiac Enzymes: No results for input(s): CKTOTAL, CKMB, CKMBINDEX, TROPONINI in the last 168 hours. BNP (last 3 results) No results for input(s): PROBNP in the last 8760 hours. HbA1C: No results for input(s): HGBA1C in the last 72 hours. CBG: No results for input(s): GLUCAP in the last 168 hours. Lipid Profile: No results for input(s): CHOL, HDL, LDLCALC, TRIG, CHOLHDL, LDLDIRECT in the last 72 hours. Thyroid Function Tests: No results for input(s): TSH, T4TOTAL, FREET4, T3FREE, THYROIDAB in the last 72 hours. Anemia Panel: No results for input(s): VITAMINB12, FOLATE, FERRITIN, TIBC, IRON, RETICCTPCT in the last 72 hours. Urine analysis:    Component Value Date/Time   COLORURINE AMBER (A) 02/28/2020 1805   APPEARANCEUR HAZY (A) 02/28/2020 1805   LABSPEC 1.011 02/28/2020 1805   PHURINE 7.0 02/28/2020 1805   GLUCOSEU NEGATIVE 02/28/2020 1805   HGBUR NEGATIVE 02/28/2020 1805   BILIRUBINUR NEGATIVE 02/28/2020 1805   KETONESUR NEGATIVE 02/28/2020 1805   PROTEINUR NEGATIVE 02/28/2020 1805   UROBILINOGEN 0.2  12/26/2019 1226   NITRITE POSITIVE (A) 02/28/2020 1805   LEUKOCYTESUR SMALL (A) 02/28/2020 1805   Sepsis Labs: @LABRCNTIP (procalcitonin:4,lacticidven:4) )No results found for this or any previous visit (from the past 240 hour(s)).   Radiological Exams on Admission: CT Angio Head W or Wo Contrast  Result Date: 03/05/2021 CLINICAL DATA:  Initial evaluation for neuro deficit. EXAM: CT ANGIOGRAPHY HEAD AND NECK TECHNIQUE: Multidetector CT imaging of the head and neck was performed using the standard protocol during bolus administration of intravenous contrast. Multiplanar CT image reconstructions and MIPs were obtained to evaluate the vascular anatomy. Carotid stenosis measurements (when applicable) are obtained utilizing NASCET criteria, using the distal internal carotid diameter as the denominator. CONTRAST:  115mL OMNIPAQUE IOHEXOL 350 MG/ML SOLN COMPARISON:  Prior CT from earlier the same day. FINDINGS: CTA NECK FINDINGS Aortic arch: Visualized arch normal caliber with normal 3 vessel morphology. Mild atheromatous change about the arch itself. No significant stenosis about the origin of the great vessels. Right carotid system: Right common and internal carotid arteries widely patent without stenosis, dissection or occlusion. Left carotid system: Left common and internal carotid arteries widely patent without stenosis, dissection or occlusion. Vertebral arteries: Both vertebral arteries arise from the subclavian arteries. Right vertebral artery dominant. No significant proximal subclavian artery stenosis. Vertebral arteries patent within the neck without stenosis, dissection or occlusion. Skeleton: No visible acute osseous finding. No discrete or worrisome osseous lesions. Severe thoracic scoliosis partially visualized. Other neck: No other acute soft tissue abnormality within the neck. Probable right vocal cord paresis noted. Upper chest: Scattered atelectatic changes noted within the partially visualized  left lower lobe. 7 mm nodular density partially visualized within the right lower lobe (series 1, image 1). Linear atelectasis versus scarring present at the right lung apex. Suspected hiatal hernia partially visualized. Review of the MIP images confirms the above findings CTA HEAD FINDINGS Anterior circulation: Petrous, cavernous, and supraclinoid segments widely patent without stenosis. A1 segments patent bilaterally. Normal anterior communicating artery complex. Anterior cerebral arteries patent to their distal aspects without stenosis. No M1 stenosis or occlusion. Normal MCA bifurcations. Distal MCA branches well perfused and symmetric. Posterior circulation: Pipeline stent in place within the right V4 segment, extending from the dural reflection to the vertebrobasilar junction. Patent flow seen through the stent. 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 Residual aneurysm filling measures 6 x 7  x 6 mm, previously 6 x 6 x 6 mm on prior CTA from 10/03/2018. Hypoplastic left vertebral artery remains widely patent. Left PICA origin patent and normal. Basilar widely patent. Superior cerebellar arteries patent bilaterally. Both PCA supplied via the basilar as well as bilateral posterior communicating arteries, larger on the right. PCAs remain well perfused to their distal aspects. Venous sinuses: Patent allowing for timing of the contrast bolus. Anatomic variants: Mildly hypoplastic right P1 segment with robust right posterior communicating artery. No new aneurysm. Review of the MIP images confirms the above findings IMPRESSION: 1. Negative CTA for large vessel occlusion. 2. Sequelae of prior endovascular treatment of right PICA aneurysm with pipeline stent in place within right V4 segment, stable in position. Residual aneurysm filling measuring 6 x 7 x 6 mm, little interval changed, previously measuring 6 x 6 x 6 mm on CTA from 10/03/2018. 3. Otherwise stable and negative CTA of the head and neck. No other  hemodynamically significant or correctable stenosis. 4. 7 mm right lower lobe nodule, partially visualized. Further evaluation with dedicated cross-sectional imaging of the chest suggested for further evaluation. Electronically Signed   By: Jeannine Boga M.D.   On: 03/05/2021 00:00   CT Head Wo Contrast  Result Date: 03/04/2021 CLINICAL DATA:  Dizziness, fall, head injury EXAM: CT HEAD WITHOUT CONTRAST TECHNIQUE: Contiguous axial images were obtained from the base of the skull through the vertex without intravenous contrast. COMPARISON:  MRI 11/17/2019 FINDINGS: Brain: Right suboccipital craniotomy has been performed. A hyperdense mass is seen within the right cerebellum medullary angle measuring 1.5 x 2.1 cm at axial image # 7/2 compatible with the known PICA aneurysm and demonstrating interval growth since prior MRI examination where this measured roughly 12 mm x 18 mm. A vascular stent compatible with a pipeline stent is seen within the right V4 segment of the vertebral artery extending to the origin of the basilar artery. No evidence of acute intracranial hemorrhage or infarct. No abnormal mass effect or midline shift. No abnormal intra or extra-axial fluid collection identified. There is mild ventriculomegaly again identified which appears disproportionate to the degree of cerebral atrophy suggesting underlying communicating hydrocephalus. Mild parenchymal volume loss is again noted. Moderate periventricular white matter changes are again seen, possibly related to small vessel ischemic change. Vascular: No asymmetric hyperdense vasculature at the skull base. Skull: There is no acute fracture identified. Sinuses/Orbits: There is partial opacification of the right sphenoid sinus. Mucous retention cyst noted within the left maxillary sinus. Remaining paranasal sinuses are clear. Orbits are unremarkable. Other: Mastoid air cells and middle ear cavities are clear. IMPRESSION: Interval endovascular repair  of known right PICA aneurysm. The aneurysm sac appears enlarged versus remote prior examination, though intervening examines are not available to determine interval change prior to endovascular therapy. The aneurysm sac demonstrates interval increase in size since prior examination measuring 21 mm in greatest dimension. Patency of the aneurysm sac is not well assessed on this examination and would be better assessed with MRI or formal arteriography. Stable mild ventriculomegaly disproportionate to the degree of cerebral atrophy. Clinical correlation for signs and symptoms of normal pressure hydrocephalus may be helpful. No acute intracranial hemorrhage or infarct. Stable mild to moderate senescent change. Mild paranasal sinus disease. Electronically Signed   By: Fidela Salisbury MD   On: 03/04/2021 20:31   CT Angio Neck W and/or Wo Contrast  Result Date: 03/05/2021 CLINICAL DATA:  Initial evaluation for neuro deficit. EXAM: CT ANGIOGRAPHY HEAD AND NECK TECHNIQUE: Multidetector  CT imaging of the head and neck was performed using the standard protocol during bolus administration of intravenous contrast. Multiplanar CT image reconstructions and MIPs were obtained to evaluate the vascular anatomy. Carotid stenosis measurements (when applicable) are obtained utilizing NASCET criteria, using the distal internal carotid diameter as the denominator. CONTRAST:  149mL OMNIPAQUE IOHEXOL 350 MG/ML SOLN COMPARISON:  Prior CT from earlier the same day. FINDINGS: CTA NECK FINDINGS Aortic arch: Visualized arch normal caliber with normal 3 vessel morphology. Mild atheromatous change about the arch itself. No significant stenosis about the origin of the great vessels. Right carotid system: Right common and internal carotid arteries widely patent without stenosis, dissection or occlusion. Left carotid system: Left common and internal carotid arteries widely patent without stenosis, dissection or occlusion. Vertebral arteries: Both  vertebral arteries arise from the subclavian arteries. Right vertebral artery dominant. No significant proximal subclavian artery stenosis. Vertebral arteries patent within the neck without stenosis, dissection or occlusion. Skeleton: No visible acute osseous finding. No discrete or worrisome osseous lesions. Severe thoracic scoliosis partially visualized. Other neck: No other acute soft tissue abnormality within the neck. Probable right vocal cord paresis noted. Upper chest: Scattered atelectatic changes noted within the partially visualized left lower lobe. 7 mm nodular density partially visualized within the right lower lobe (series 1, image 1). Linear atelectasis versus scarring present at the right lung apex. Suspected hiatal hernia partially visualized. Review of the MIP images confirms the above findings CTA HEAD FINDINGS Anterior circulation: Petrous, cavernous, and supraclinoid segments widely patent without stenosis. A1 segments patent bilaterally. Normal anterior communicating artery complex. Anterior cerebral arteries patent to their distal aspects without stenosis. No M1 stenosis or occlusion. Normal MCA bifurcations. Distal MCA branches well perfused and symmetric. Posterior circulation: Pipeline stent in place within the right V4 segment, extending from the dural reflection to the vertebrobasilar junction. Patent flow seen through the stent. 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 Residual aneurysm filling measures 6 x 7 x 6 mm, previously 6 x 6 x 6 mm on prior CTA from 10/03/2018. Hypoplastic left vertebral artery remains widely patent. Left PICA origin patent and normal. Basilar widely patent. Superior cerebellar arteries patent bilaterally. Both PCA supplied via the basilar as well as bilateral posterior communicating arteries, larger on the right. PCAs remain well perfused to their distal aspects. Venous sinuses: Patent allowing for timing of the contrast bolus. Anatomic variants: Mildly  hypoplastic right P1 segment with robust right posterior communicating artery. No new aneurysm. Review of the MIP images confirms the above findings IMPRESSION: 1. Negative CTA for large vessel occlusion. 2. Sequelae of prior endovascular treatment of right PICA aneurysm with pipeline stent in place within right V4 segment, stable in position. Residual aneurysm filling measuring 6 x 7 x 6 mm, little interval changed, previously measuring 6 x 6 x 6 mm on CTA from 10/03/2018. 3. Otherwise stable and negative CTA of the head and neck. No other hemodynamically significant or correctable stenosis. 4. 7 mm right lower lobe nodule, partially visualized. Further evaluation with dedicated cross-sectional imaging of the chest suggested for further evaluation. Electronically Signed   By: Jeannine Boga M.D.   On: 03/05/2021 00:00   MR BRAIN WO CONTRAST  Result Date: 03/05/2021 CLINICAL DATA:  Initial evaluation for acute TIA. EXAM: MRI HEAD WITHOUT CONTRAST TECHNIQUE: Multiplanar, multiecho pulse sequences of the brain and surrounding structures were obtained without intravenous contrast. COMPARISON:  Prior CTs from 03/04/2021 and MRI from 11/17/2019. FINDINGS: Brain: Diffuse prominence of the CSF containing  spaces compatible with generalized cerebral atrophy. Patchy and confluent T2/FLAIR hyperintensity within the periventricular and deep white matter of both cerebral hemispheres as well as the pons, most consistent with chronic microvascular ischemic disease, moderate in nature. There is a subtle 12 mm linear focus of diffusion abnormality involving the right cerebellum (series 5, image 56) associated T2/FLAIR signal abnormality without ADC correlate, suggesting a small subacute ischemic infarct. This is new as compared to previous MRI. No other diffusion abnormality to suggest acute or subacute ischemia. Gray-white matter differentiation otherwise maintained. No other areas of encephalomalacia to suggest chronic  cortical infarction. No findings to suggest acute intracranial hemorrhage. No mass lesion, mass effect, or midline shift. Diffuse ventricular prominence somewhat out of proportion to cortical sulcation, similar to previous. No extra-axial fluid collection. Pituitary gland suprasellar region normal. Midline structures intact. 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 The aneurysm sac is little interval changed in overall size measuring 17 x 12 x 12 mm (previously 17 x 11 x 11 mm). 7 mm residual flow void within the aneurysm sac better appreciated on prior CTA. Patent flow void through the right the for pipeline stent. Hypoplastic left vertebral artery noted. Major intracranial vascular flow voids otherwise well maintained. Skull and upper cervical spine: Craniocervical junction within normal limits. Bone marrow signal intensity mildly heterogeneous but within normal limits. Prior right suboccipital craniectomy noted. No scalp soft tissue abnormality. Sinuses/Orbits: Globes and orbital soft tissues demonstrate no acute finding. Left maxillary sinus retention cyst. Scattered mucosal thickening noted within the sphenoid ethmoidal sinuses. No mastoid effusion. Inner ear structures grossly normal. Other: None. IMPRESSION: 1. 12 mm linear focus of diffusion abnormality involving the right cerebellum, suggesting a small subacute ischemic infarct. This is new as compared to previous MRI from 11/17/2019. 2. No other acute intracranial abnormality. 3. Partially thrombosed right PICA aneurysm, relatively stable in size measuring up to 17 mm with residual 7 mm internal flow void. 4. Diffuse ventricular prominence somewhat out of proportion to cortical sulcation, stable. While this finding may be related to underlying atrophy, a component of normal pressure hydrocephalus could also be considered in the correct clinical setting. 5. Underlying moderate chronic microvascular ischemic disease. 6. Prior right  suboccipital craniectomy. Electronically Signed   By: Jeannine Boga M.D.   On: 03/05/2021 01:06    Assessment/Plan   1. Ischemic CVA  - Patient presents with headache, weakness, and transient episodes of speech difficulty since falling on 02/28/21 and has MRI findings suspicious for small subacute infarction involving right cerebellum  - Neurology consulting and much appreciated  - Admit to [facility], continue neuro checks, keep NPO pending swallow screen, continue cardiac monitoring, check echocardiogram, check lipids and A1c, consult PT/OT/SLP    2. Transient aphasia  - Patient noted to recurrent 1-2 minute episodes in ED marked by expressive aphasia with blinking of bilateral eyes, quivering of lip, and maintained awareness with ability to follow commands and recall what was said to her during the episodes  - Neurology consulting, EEG ordered    3. Hypokalemia  - Serum potassium was 2.8 in ED and was treated with 30 mEq IV potassium  - Add KCl to IVF, repeat chem panel with mag level    4. Lung nodule  - Noted incidentally on CT in ED  - Outpatient follow-up recommended    5. Depression, anxiety  - Patient unable to confirm current medications  - Follow-up pharmacy medication reconciliation    DVT prophylaxis: Lovenox  Code Status: Full  Level of Care: Level of care: Telemetry Medical Family Communication: Daughter updated from ED  Disposition Plan:  Patient is from: Home  Anticipated d/c is to: TBD Anticipated d/c date is: 6/9 or 03/07/21  Patient currently: Pending CVA workup, EEG, neurology consultation   Consults called: Neurology  Admission status: Observation     Vianne Bulls, MD [facility]  03/05/2021, 3:48 AM

## 2021-03-05 NOTE — Evaluation (Signed)
Physical Therapy Evaluation Patient Details Name: Elaine Rivera MRN: [id number] DOB: 01/01/1946 Today's Date: 03/05/2021   History of Present Illness  Pt is a 75 y/o female admitted secondary to falls, weakness, and dizziness. 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 Per notes, also concern for seizure activity and awaiting EEG. PMH includes HTN.  Clinical Impression  Pt admitted secondary to problem above with deficits below. Initially answering questions appropriately and was able to follow commands to perform mobility tasks. Was able to sit EOB with min guard A, however, reporting increased dizziness and returned to supine. Once in supine, pt with episode of flat affect and blinking eyes. When asked questions would just nod her head. RN in room to assess. Will need to further progress mobility to determine most appropriate d/c recommendations. Will continue to follow acutely maximize functional mobility independence and safety.     Follow Up Recommendations Other (comment) (TBD pending progression)    Equipment Recommendations  Other (comment) (TBD pending progression)    Recommendations for Other Services       Precautions / Restrictions Precautions Precautions: Fall Restrictions Weight Bearing Restrictions: No      Mobility  Bed Mobility Overal bed mobility: Needs Assistance Bed Mobility: Supine to Sit;Sit to Supine     Supine to sit: Min guard Sit to supine: Min guard   General bed mobility comments: Min guard for safety to come to sitting posture. Pt reporting increased dizziness and was unable to tolerate further mobility. At end of session, pt with flat affect, and blinking eyes. RN in room. When asked questions, pt would just nod head.    Transfers                    Ambulation/Gait                Stairs            Wheelchair Mobility    Modified Rankin (Stroke Patients Only) Modified Rankin (Stroke Patients Only) Pre-Morbid Rankin Score:  No significant disability Modified Rankin: Moderately severe disability     Balance Overall balance assessment: Needs assistance Sitting-balance support: Bilateral upper extremity supported;Feet supported Sitting balance-Leahy Scale: Poor Sitting balance - Comments: Reliant on BUE support                                     Pertinent Vitals/Pain Pain Assessment: Faces Faces Pain Scale: No hurt    Home Living Family/patient expects to be discharged to:: Private residence Living Arrangements: Alone Available Help at Discharge: Family Type of Home: House Home Access: Stairs to enter Entrance Stairs-Rails: Psychiatric nurse of Steps: 2 Home Layout: One level Home Equipment: Bedside commode      Prior Function Level of Independence: Independent               Hand Dominance        Extremity/Trunk Assessment   Upper Extremity Assessment Upper Extremity Assessment: Defer to OT evaluation    Lower Extremity Assessment Lower Extremity Assessment: RLE deficits/detail;LLE deficits/detail RLE Deficits / Details: Able to perform heel slide, ankle pump without difficulty. LLE Deficits / Details: Able to perform heel slide, ankle pump without difficulty.    Cervical / Trunk Assessment Cervical / Trunk Assessment: Normal  Communication   Communication: Expressive difficulties (Pt verbally responding to some questions and then not responding.)  Cognition Arousal/Alertness: Awake/alert Behavior During Therapy: [facility] for  tasks assessed/performed;Flat affect Overall Cognitive Status: No family/caregiver present to determine baseline cognitive functioning                                 General Comments: Pt initially responding verbally, but then would not responds. Epsiode at end of session with flat affect and blinking eyes. RN in room to assess at end.      General Comments General comments (skin integrity, edema, etc.): No  family present    Exercises     Assessment/Plan    PT Assessment Patient needs continued PT services  PT Problem List Decreased strength;Decreased activity tolerance;Decreased range of motion;Decreased balance;Decreased mobility;Decreased knowledge of use of DME;Decreased knowledge of precautions;Decreased cognition;Decreased safety awareness       PT Treatment Interventions DME instruction;Gait training;Stair training;Therapeutic activities;Functional mobility training;Therapeutic exercise;Balance training;Patient/family education    PT Goals (Current goals can be found in the Care Plan section)  Acute Rehab PT Goals PT Goal Formulation: Patient unable to participate in goal setting Time For Goal Achievement: 03/19/21 Potential to Achieve Goals: Fair    Frequency Min 4X/week   Barriers to discharge        Co-evaluation               AM-PAC PT "6 Clicks" Mobility  Outcome Measure Help needed turning from your back to your side while in a flat bed without using bedrails?: A Little Help needed moving from lying on your back to sitting on the side of a flat bed without using bedrails?: A Little Help needed moving to and from a bed to a chair (including a wheelchair)?: A Lot Help needed standing up from a chair using your arms (e.g., wheelchair or bedside chair)?: A Lot Help needed to walk in hospital room?: A Lot Help needed climbing 3-5 steps with a railing? : Total 6 Click Score: 13    End of Session   Activity Tolerance: Treatment limited secondary to medical complications (Comment) (dizziness,) Patient left: in bed;with call bell/phone within reach;with bed alarm set;with nursing/sitter in room Nurse Communication: Mobility status PT Visit Diagnosis: Unsteadiness on feet (R26.81);Muscle weakness (generalized) (M62.81);Difficulty in walking, not elsewhere classified (R26.2)    Time: 2841-3244 PT Time Calculation (min) (ACUTE ONLY): 11 min   Charges:   PT  Evaluation $PT Eval Moderate Complexity: 1 Mod          Reuel Derby, PT, DPT  Acute Rehabilitation Services  Pager: [phone] Office: [phone]   Rudean Hitt 03/05/2021, 3:24 PM

## 2021-03-05 NOTE — ED Notes (Signed)
Pt returned from MRI. MRI techs reported pt having episodes of aphasia lasting a maximum of 2 minutes. Collier Salina, PA-C notified of same and advised that these episodes are what prompted ordering MRI.

## 2021-03-05 NOTE — ED Provider Notes (Signed)
[place] several days ago, then fell off commode earlier today, no LOC Since then having episodic aphasia - can understand others but feels her throat becomes "paralyzed".  No other neuro deficits Per neuro-hospitalist: CTA head and neck, MRI, outpatient EEG if normal  CTA normal MRI results pending - study done  MRI results showing  "12 mm linear focus of diffusion abnormality involving the right cerebellum, suggesting a small subacute ischemic infarct. This is new as compared to previous MRI from 11/17/2019."  These results were reviewed with Dr. Jonny Ruiz who advised she will need to be admitted to the hospitalist service for stroke work up as well as routine EEG. He advises transfer to [place] paged for admission and transfer.    Charlann Lange, PA-C 03/05/21 0205    Molpus, Jenny Reichmann, MD 03/05/21 [phone]

## 2021-03-05 NOTE — Progress Notes (Signed)
LTM started; no initial skin breakdown was seen. [facility] to monitor, event button tested.

## 2021-03-05 NOTE — Consult Note (Signed)
Neurology Consultation  Reason for Consult: Intermittent aphasia Referring Physician: Dr. Dwyane Dee  CC: Intermittent aphasia  History is obtained from: Patient, Chart review  HPI: Elaine Rivera is a 75 y.o. female with a medical history significant for hypertension, cerebral aneurysm with surgical clipping in 1998 and right PICA aneurysm embolization with stent in 2019, GERD, depression, and anxiety who presented to [facility] 03/04/2021 for evaluation of a fall from her commode with intermittent aphasia. She is unable to recall if she hit her head or lost consciousness during yesterday's fall but she was unable to get off of the floor but was able to crawl to the door to open it and let her friend in. She had another fall on 02/28/2021 and hit her head at this time. After her fall yesterday, she began to experience episodes of aphasia initially occurred every 3 - 4 hours lasting 1 - 2 minutes but have since increased in frequency to approximately 3 times within one hour while being hooked up to LTM EEG at [facility]. Initial work up with CT imaging revealed no acute intracranial hemorrhage or infarct, no large vessel occlusion, and demonstrated sequelae of prior endovascular treatment of the right PICA aneurysm. MRI brain revealed diffusion abnormality involving the right cerebellum suggesting a small subacute infarct and a partially thrombosed right PICA aneurysm. During routine EEG evaluation at [facility], the patient experienced an episode of aphasia lasting 3 - 5 minutes with some reported concomitant mouth twitching per EEG tech. On arrival to [facility], her EEG connection and evaluation was continued with 3 more 1 - 2 minute episodes of aphasia noted during connection.   At baseline, Elaine Rivera lives alone and is supposed to use a walker for ambulation. Elaine Rivera daughter checks in on her frequently but Elaine Rivera does have frequent falls and underreports her falling to her family. She was started on Remeron  3 weeks ago with a subsequent modd elevation and activity level.   ROS: A complete ROS was performed and is negative except as noted in the HPI.   Past Medical History:  Diagnosis Date  . Anemia    years ago  . Arthritis   . Basal cell carcinoma of eye    biopsy of left eye/ non cancerous  . Complication of anesthesia    nausea/vomiting  . Depression   . GERD (gastroesophageal reflux disease)   . History of kidney stones   . Hypertension    no longer on medications   Past Surgical History:  Procedure Laterality Date  . CEREBRAL ANEURYSM REPAIR     1998  . COLONOSCOPY    . ECTOPIC PREGNANCY SURGERY     x2   . ENTEROSCOPY N/A 03/01/2020   Procedure: ENTEROSCOPY;  Surgeon: Carol Ada, MD;  Location: [facility];  Service: Endoscopy;  Laterality: N/A;  . ESOPHAGOGASTRODUODENOSCOPY N/A 04/22/2016   Procedure: ESOPHAGOGASTRODUODENOSCOPY (EGD);  Surgeon: Juanita Craver, MD;  Location: [facility];  Service: Endoscopy;  Laterality: N/A;  . IR 3D INDEPENDENT WKST  09/05/2018  . IR ANGIO INTRA EXTRACRAN SEL COM CAROTID INNOMINATE BILAT MOD SED  09/05/2018  . IR ANGIO VERTEBRAL SEL VERTEBRAL UNI R MOD SED  09/05/2018  . IR ANGIOGRAM FOLLOW UP STUDY  09/05/2018  . IR TRANSCATH/EMBOLIZ  09/05/2018  . PARATHYROIDECTOMY     2002  . RADIOLOGY WITH ANESTHESIA N/A 09/05/2018   Procedure: EMBOLIZATION;  Surgeon: Radiologist, Medication, MD;  Location: [place];  Service: Radiology;  Laterality: N/A;  . TONSILLECTOMY  [place]     2001  . VOCAL CORD LATERALIZATION, ENDOSCOPIC APPROACH W/ MLB     2007 (@[facility] )  . WRIST SURGERY     left side/2008  . WRIST SURGERY     right side/ 2002   Family History  Family history unknown: Yes   Social History:   reports that she has never smoked. She has never used smokeless tobacco. She reports that she does not drink alcohol and does not use drugs.  Medications  Current Facility-Administered Medications:  .   stroke: mapping our  early stages of recovery book, , Does not apply, Once, Opyd, Ilene Qua, MD .  acetaminophen (TYLENOL) tablet 650 mg, 650 mg, Oral, Q4H PRN **OR** acetaminophen (TYLENOL) 160 MG/5ML solution 650 mg, 650 mg, Per Tube, Q4H PRN **OR** acetaminophen (TYLENOL) suppository 650 mg, 650 mg, Rectal, Q4H PRN, Opyd, Timothy S, MD .  enoxaparin (LOVENOX) injection 40 mg, 40 mg, Subcutaneous, Q24H, Opyd, Ilene Qua, MD, 40 mg at 03/05/21 0948 .  levETIRAcetam (KEPPRA) IVPB 1000 mg/100 mL premix, 1,000 mg, Intravenous, Once, Opyd, Ilene Qua, MD .  LORazepam (ATIVAN) injection 1 mg, 1 mg, Intravenous, Q4H PRN, Pokhrel, Laxman, MD, 1 mg at 03/05/21 1616 .  senna-docusate (Senokot-S) tablet 1 tablet, 1 tablet, Oral, QHS PRN, Opyd, Ilene Qua, MD  Exam: Current vital signs: BP (!) 111/59 (BP Location: Right Arm)   Pulse 70   Temp 97.7 F (36.5 C) (Oral)   Resp 18   Ht 4\' 11"  (1.499 m)   Wt 59 kg   SpO2 99%   BMI 26.26 kg/m  Vital signs in last 24 hours: Temp:  [97.7 F (36.5 C)-98.6 F (37 C)] 97.7 F (36.5 C) (06/08 1627) Pulse Rate:  [60-85] 70 (06/08 1627) Resp:  [13-25] 18 (06/08 1345) BP: (102-136)/(59-81) 111/59 (06/08 1627) SpO2:  [90 %-100 %] 99 % (06/08 1627) Weight:  [59 kg] 59 kg (06/07 1933)  GENERAL: Awake, alert, in no acute distress Psych: Affect appropriate for situation, calm and cooperative with exam Head: Normocephalic and atraumatic, without obvious abnormality EENT: Normal conjunctivae, no OP obstruction, dry mucous membranes LUNGS: Normal respiratory effort, non-labored breathing CV: Regular rate on telemetry ABDOMEN: Soft, non-tender Ext: warm, well perfused, without edema  NEURO:  Mental Status: Awake, alert, and oriented to person, place, age, month, and situation. She states incorrectly that the year is 2012. Speech is mildly dysarthric and intermittently aphasic.  Naming, repetition, fluency, and comprehension are intact with speech arrest during her "episodes" that  last approximately 1 - 2 minutes and can occasionally nod yes/no appropriately. She remains aware during these episodes and is able to track examiner but is unable to speak.  No neglect is noted.  Cranial Nerves:  II: PERRL 4 mm/brisk. Visual fields full.  III, IV, VI: EOMI with saccadic movements noted V: Sensation is intact to light touch and symmetrical to face. VII: Face is symmetric resting and smiling.   VIII: Hearing is intact to voice IX, X: Palate elevation is symmetric. Patient is hypophonic.  XI: Normal sternocleidomastoid and trapezius muscle strength with pain limitation assessment of left shoulder from previous injury (years ago) XII: Tongue protrudes midline without fasciculations.   Motor: 5/5 strength present in bilateral lower extremities with 4/5 strength of bilateral upper extremities. No vertical drift is noted throughout extremity assessment.  Left upper extremity assessment is pain limited.  Fine tremor with intention noted in bilateral upper extremities. Tone is normal. Bulk is decreased.  Sensation: Intact to light touch bilaterally in all four extremities.  Coordination: FTN intact bilaterally. HKS intact bilaterally. No pronator drift. Alternating hand movements.  DTRs: 1+ and symmetric patellae, 2+ and symmetric biceps  Gait: deferred  NIHSS: 1a Level of Conscious.: 0 1b LOC Questions: 0 1c LOC Commands: 0 2 Best Gaze: 0 3 Visual: 0 4 Facial Palsy: 0 5a Motor Arm - left: 0 5b Motor Arm - Right: 0 6a Motor Leg - Left: 0 6b Motor Leg - Right: 0 7 Limb Ataxia: 0 8 Sensory: 0 9 Best Language: 0; transient episodes of severe aphasia 2 10 Dysarthria: 0 11 Extinct. and Inatten.: 0 TOTAL: 0, 2 during episodes   Labs I have reviewed labs in epic and the results pertinent to this consultation are: CBC    Component Value Date/Time   WBC 8.9 03/04/2021 1924   RBC 4.59 03/04/2021 1924   HGB 11.5 (L) 03/04/2021 1924   HCT 36.5 03/04/2021 1924   PLT 305  03/04/2021 1924   MCV 79.5 (L) 03/04/2021 1924   MCH 25.1 (L) 03/04/2021 1924   MCHC 31.5 03/04/2021 1924   RDW 17.3 (H) 03/04/2021 1924   LYMPHSABS 1.1 03/01/2020 0525   MONOABS 0.7 03/01/2020 0525   EOSABS 0.1 03/01/2020 0525   BASOSABS 0.0 03/01/2020 0525   CMP     Component Value Date/Time   NA 139 03/05/2021 0400   K 3.0 (L) 03/05/2021 0400   CL 108 03/05/2021 0400   CO2 23 03/05/2021 0400   GLUCOSE 94 03/05/2021 0400   BUN 6 (L) 03/05/2021 0400   CREATININE 0.54 03/05/2021 0400   CALCIUM 7.3 (L) 03/05/2021 0400   PROT 6.8 03/04/2021 1924   ALBUMIN 3.8 03/04/2021 1924   AST 40 03/04/2021 1924   ALT 24 03/04/2021 1924   ALKPHOS 80 03/04/2021 1924   BILITOT 0.7 03/04/2021 1924   GFRNONAA >60 03/05/2021 0400   GFRAA >60 03/01/2020 0525   Lipid Panel     Component Value Date/Time   CHOL 175 03/05/2021 0400   TRIG 68 03/05/2021 0400   HDL 60 03/05/2021 0400   CHOLHDL 2.9 03/05/2021 0400   VLDL 14 03/05/2021 0400   LDLCALC 101 (H) 03/05/2021 0400   No results found for: HGBA1C   Urinalysis    Component Value Date/Time   COLORURINE AMBER (A) 02/28/2020 1805   APPEARANCEUR HAZY (A) 02/28/2020 1805   LABSPEC 1.011 02/28/2020 1805   PHURINE 7.0 02/28/2020 1805   GLUCOSEU NEGATIVE 02/28/2020 1805   HGBUR NEGATIVE 02/28/2020 1805   BILIRUBINUR NEGATIVE 02/28/2020 1805   KETONESUR NEGATIVE 02/28/2020 1805   PROTEINUR NEGATIVE 02/28/2020 1805   UROBILINOGEN 0.2 12/26/2019 1226   NITRITE POSITIVE (A) 02/28/2020 1805   LEUKOCYTESUR SMALL (A) 02/28/2020 1805   Results for orders placed or performed during the hospital encounter of 03/04/21  Resp Panel by RT-PCR (Flu A&B, Covid) Nasopharyngeal Swab     Status: None   Collection Time: 03/05/21  2:13 AM   Specimen: Nasopharyngeal Swab; Nasopharyngeal(NP) swabs in vial transport medium  Result Value Ref Range Status   SARS Coronavirus 2 by RT PCR NEGATIVE NEGATIVE Final    Comment: (NOTE) SARS-CoV-2 target nucleic  acids are NOT DETECTED.  The SARS-CoV-2 RNA is generally detectable in upper respiratory specimens during the acute phase of infection. The lowest concentration of SARS-CoV-2 viral copies this assay can detect is 138 copies/mL. A negative result does not preclude SARS-Cov-2 infection and should not be used as the sole basis  for treatment or other patient management decisions. A negative result may occur with  improper specimen collection/handling, submission of specimen other than nasopharyngeal swab, presence of viral mutation(s) within the areas targeted by this assay, and inadequate number of viral copies(<138 copies/mL). A negative result must be combined with clinical observations, patient history, and epidemiological information. The expected result is Negative.  Fact Sheet for Patients:  EntrepreneurPulse.com.au  Fact Sheet for Healthcare Providers:  IncredibleEmployment.be  This test is no t yet approved or cleared by the [place] FDA and  has been authorized for detection and/or diagnosis of SARS-CoV-2 by FDA under an Emergency Use Authorization (EUA). This EUA will remain  in effect (meaning this test can be used) for the duration of the COVID-19 declaration under Section 564(b)(1) of the Act, 21 U.S.C.section 360bbb-3(b)(1), unless the authorization is terminated  or revoked sooner.       Influenza A by PCR NEGATIVE NEGATIVE Final   Influenza B by PCR NEGATIVE NEGATIVE Final    Comment: (NOTE) The Xpert Xpress SARS-CoV-2/FLU/RSV plus assay is intended as an aid in the diagnosis of influenza from Nasopharyngeal swab specimens and should not be used as a sole basis for treatment. Nasal washings and aspirates are unacceptable for Xpert Xpress SARS-CoV-2/FLU/RSV testing.  Fact Sheet for Patients: EntrepreneurPulse.com.au  Fact Sheet for Healthcare Providers: IncredibleEmployment.be  This  test is not yet approved or cleared by the [place] FDA and has been authorized for detection and/or diagnosis of SARS-CoV-2 by FDA under an Emergency Use Authorization (EUA). This EUA will remain in effect (meaning this test can be used) for the duration of the COVID-19 declaration under Section 564(b)(1) of the Act, 21 U.S.C. section 360bbb-3(b)(1), unless the authorization is terminated or revoked.  Performed at [facility], [place] [address] [place], [place] [address]    Imaging I have reviewed the images obtained: CT-scan of the brain Interval endovascular repair of known right PICA aneurysm. The aneurysm sac appears enlarged versus remote prior examination, though intervening examines are not available to determine interval change prior to endovascular therapy. The aneurysm sac demonstrates interval increase in size since prior examination measuring 21 mm in greatest dimension. Patency of the aneurysm sac is not well assessed on this examination and would be better assessed with MRI or formal arteriography.  Stable mild ventriculomegaly disproportionate to the degree of cerebral atrophy. Clinical correlation for signs and symptoms of normal pressure hydrocephalus may be helpful.  No acute intracranial hemorrhage or infarct.  Stable mild to moderate senescent change.  CT angio head and neck: 1. Negative CTA for large vessel occlusion. 2. Sequelae of prior endovascular treatment of right PICA aneurysm with pipeline stent in place within right V4 segment, stable in position. Residual aneurysm filling measuring 6 x 7 x 6 mm, little interval changed, previously measuring 6 x 6 x 6 mm on CTA from 10/03/2018. 3. Otherwise stable and negative CTA of the head and neck. No other hemodynamically significant or correctable stenosis. 4. 7 mm right lower lobe nodule, partially visualized. Further evaluation with dedicated cross-sectional imaging of the chest suggested for  further evaluation.  MRI examination of the brain 1. 12 mm linear focus of diffusion abnormality involving the right cerebellum, suggesting a small subacute ischemic infarct. This is new as compared to previous MRI from 11/17/2019. 2. No other acute intracranial abnormality. 3. Partially thrombosed right PICA aneurysm, relatively stable in size measuring up to 17 mm with residual 7 mm internal flow void. 4. Diffuse  ventricular prominence somewhat out of proportion to cortical sulcation, stable. While this finding may be related to underlying atrophy, a component of normal pressure hydrocephalus could also be considered in the correct clinical setting. 5. Underlying moderate chronic microvascular ischemic disease. 6. Prior right suboccipital craniectomy.  Echocardiogram 03/05/2021:  "1. Left ventricular ejection fraction, by estimation, is 60 to 65%. The left ventricle has normal function. The left ventricle has no regional wall motion abnormalities. There is mild left ventricular hypertrophy. Left ventricular diastolic parameters are consistent with Grade I diastolic dysfunction (impaired relaxation).  2. Right ventricular systolic function is normal. The right ventricular size is normal.  3. The mitral valve is normal in structure. Mild mitral valve regurgitation. No evidence of mitral stenosis.  4. The aortic valve is normal in structure. Aortic valve regurgitation is not visualized. No aortic stenosis is present.  5. The inferior vena cava is normal in size with greater than 50% respiratory variability, suggesting right atrial pressure of 3 mmHg.  Comparison(s): No significant change from prior study. Prior images reviewed side by side.  Conclusion(s)/Recommendation(s): No intracardiac source of embolism detected on this transthoracic study. A transesophageal echocardiogram is recommended to exclude cardiac source of embolism if clinically indicated."  EEG pending  Assessment: 75 y.o.  female with PMHx of frequent falls, right PICA aneurysm s/p stent placement, GERD, depression, and anxiety who presented to [facility] after a fall on 03/04/2021 and acute, intermittent episodes of aphasia.  - Initial work up revealed a small right cerebellar subacute infarct and a partially thrombosed right PICA aneurysm. EEG was obtained with concern for new onset seizure activity during aphasic episodes. UA leukocyte and nitrite positive.  - Examination on [place] arrival revealed patient with increasing frequency of aphasic episodes. LTM EEG was initiated and patient was given 1 mg of Ativan and loaded with 1 gram of Keppra with 500 mg BID maintenance dosing.   - MRI with evidence of a small subacute right cerebellar infarct and partially thrombosed right PICA aneurysm.  - Patient with evidence of EEG changes during aphasic episodes consistent with new onset seizures. Recent fall possibly secondary to subacute small right cerebellar stroke.   Impression: New onset focal seizures Subacute small right cerebellar infarct  Recommendations: # New onset seizure activity - Continue LTM EEG - Inpatient seizure precautions - 20 mg/kg Keppra loading dose, 500 mg BID maintenance dose initiated - 1 mg Ativan for any seizure lasting > 5 minutes - Obtain urine culture - Treatment of toxic metabolic derangements per primary team  # Small subacute right cerebellar infarct # Partially thrombosed right PICA aneurysm - HgbA1c - Statin therapy for LDL above goal of 70 - Frequent neuro checks - Prophylactic therapy- Antiplatelet med: Aspirin - 325mg  PO or 300mg  PR followed by 81 mg daily - Risk factor modification - Telemetry monitoring - PT consult, OT consult, Speech consult  Anibal Henderson, AGAC-NP [facility] Pager: [phone]  Neurology Attending Attestation   I examined the patient and discussed plan with Ms. Toberman NP. Above note has been edited by me to reflect my findings and  recommendations.   Su Monks, MD [facility] [phone]   If 7pm- 7am, please page neurology on call as listed in [place].

## 2021-03-05 NOTE — ED Notes (Signed)
Pt daughter Yazlin Ekblad called and updated that patient is transferring to 3W Bed 27 C and the unit phone number is [phone].

## 2021-03-05 NOTE — Progress Notes (Signed)
Put EEG leads on patient and then [facility] arrived; left electrodes on patient's head. Will finish EEG when patient arrives at [facility].

## 2021-03-05 NOTE — ED Notes (Signed)
Potassium paused. Pt transported to CT

## 2021-03-05 NOTE — ED Notes (Signed)
Pt O2 saturation fluctuated with good pleth (low as 70s) while asleep. RN placed pt on 2L while asleep for supportive therapy

## 2021-03-05 NOTE — Progress Notes (Signed)
EEG Completed; Results Pending LTM to follow

## 2021-03-05 NOTE — ED Notes (Signed)
Family at bedside, brought home medications. Noted symptoms around the same time pt started Remeron 15 mg PO daily at bedtime.

## 2021-03-05 NOTE — ED Notes (Signed)
Attempted to call RN report to 3W.

## 2021-03-05 NOTE — ED Notes (Signed)
Daughter at bedside, stated pt is going through an 'episode.' Pt starting off to the R and blinking, head is twitching to the right mildly, able to speak but words are incomprehensible. Able to move all extremities and follow commands.

## 2021-03-05 NOTE — ED Notes (Signed)
Pt cleaned and repositioned in bed with RN Tabitha. Pt is much more comfortable and has no needs/wants at this time.

## 2021-03-05 NOTE — Procedures (Signed)
Patient Name: Elaine Rivera  MRN: [id number]  Epilepsy Attending: Lora Havens  Referring Physician/Provider: Dr Mitzi Hansen Date: 03/05/2021 Duration: 24.09 mins  Patient history: 75yo F with transient aphasia. EEG to evaluate for seizure  Level of alertness: Awake  AEDs during EEG study: None  Technical aspects: This EEG study was done with scalp electrodes positioned according to the 10-20 International system of electrode placement. Electrical activity was acquired at a sampling rate of 500Hz  and reviewed with a high frequency filter of 70Hz  and a low frequency filter of 1Hz . EEG data were recorded continuously and digitally stored.   Description: The posterior dominant rhythm consists of 9 Hz activity of moderate voltage (25-35 uV) seen predominantly in posterior head regions, symmetric and reactive to eye opening and eye closing. Event button was pressed at 1543 for speech disturbance,unable to answer questions. Concomitant EEG showed rhythmic sharply contoured 7-8hz  alpha activity arising from left centro-temporal region  which evolved into 5-6hz  theta slowing  which gradually involved all of left hemisphere consistent with focal seizure. The seizure started at 1542 and lasted about 6 minutes. Hyperventilation and photic stimulation were not performed.     ABNORMALITY - Focal seizure, left centro-temporal region  IMPRESSION: This study showed showed one seizure on 03/05/2021 at 1542 arising from left centro-temporal region during which patient was unable to speak, lasted for about 6 minutes.   Dr Myna Hidalgo was notified.   [name]

## 2021-03-05 NOTE — ED Notes (Signed)
Provided update to [place] (pt's daughter). Preferred contact is [phone] (home)

## 2021-03-05 NOTE — ED Notes (Addendum)
Pt has varying mentation. Currently she is not experiencing the transient episodes aphagia. Call bell within reach. Bed rails up

## 2021-03-05 NOTE — ED Notes (Signed)
[facility] notified to transport patient.  

## 2021-03-05 NOTE — ED Notes (Signed)
Pt experiencing another aphagic episode. Pt unable to respond fully in words. Oral medication held at this time

## 2021-03-05 NOTE — ED Notes (Signed)
EEG at bedside.

## 2021-03-05 NOTE — Discharge Instructions (Addendum)
Elaine Rivera,  You were in the hospital because of difficulty speaking and found to have a stroke in addition to evidence of seizures. You have been started on an anti-seizure medication called Keppra. For your stroke, you have been started on Lipitor to lower your cholesterol. Please follow-up with the neurologist as an outpatient for your stroke and seizures.   Per [facility] statutes, patients with seizures are not allowed to drive until  they have been seizure-free for six months. Use caution when using heavy equipment or power tools. Avoid working on ladders or at heights. Take showers instead of baths. Ensure the water temperature is not too high on the home water heater. Do not go swimming alone. When caring for infants or small children, sit down when holding, feeding, or changing them to minimize risk of injury to the child in the event you have a seizure.    Also, Maintain good sleep hygiene. Avoid alcohol.   --> Call 911 and bring the patient back to the ED if:               A.  The seizure lasts longer than 5 minutes.                  B.  The patient doesn't awaken shortly after the seizure             C.  The patient has new problems such as difficulty seeing, speaking or moving             D.  The patient was injured during the seizure             E.  The patient has a temperature over 102 F (39C)             F.  The patient vomited and now is having trouble breathing

## 2021-03-05 NOTE — ED Notes (Signed)
2nd bag of potassium restarted after pt completed CT and MRI scans.

## 2021-03-05 NOTE — ED Notes (Signed)
Report given to Melissa, RN

## 2021-03-05 NOTE — Plan of Care (Signed)

## 2021-03-05 NOTE — Progress Notes (Signed)
Patient admitted to 3W this afternoon from [facility], reported to be having short focal seizures lasting 2 minutes or less during transport with PTAR. Upon assessment at bedside, pt noted to have two focal seizures lasting 2 minutes each. During these episodes, the patient is unable to talk or answer questions, post ictal state lasting around 2-3 minutes before she is able to answer questions. Vital signs are stable, pt denies pain.   MD paged and updated, Ativan PRN ordered. At 1610, RN called to bedside by NP for another seizure episode during EEG monitoring, 1 mg Ativan given. New orders placed by MD, see MAR for details.

## 2021-03-05 NOTE — Care Plan (Signed)
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  MRI brain showed 12 mm linear focus of diffusion abnormality involving the right cerebellum suspicion for small subacute ischemic infarction.  CTA head and neck was negative for large vessel occlusion but notable for partially visualized lung nodules and prior right PICA aneurysmal treatment with a stent in stable position. Patient is admitted for ischemic CVA work-up.  Neurology was consulted.  Echocardiogram showed LVEF 60 to 65%, no regional wall motion abnormalities.  Neurology recommended EEG for transient episodes of aphasia. Patient has received IV potassium for hypokalemia, continue to monitor.  Patient needs outpatient follow-up for incidental lung nodule on CT. Patient was seen and examined at bedside.  Daughter was present.  All the questions answered.  Patient is admitted in [place] for stroke work-up.

## 2021-03-06 ENCOUNTER — Inpatient Hospital Stay (HOSPITAL_COMMUNITY): Payer: PPO

## 2021-03-06 ENCOUNTER — Observation Stay (HOSPITAL_COMMUNITY): Payer: PPO

## 2021-03-06 DIAGNOSIS — Z85840 Personal history of malignant neoplasm of eye: Secondary | ICD-10-CM | POA: Diagnosis not present

## 2021-03-06 DIAGNOSIS — K219 Gastro-esophageal reflux disease without esophagitis: Secondary | ICD-10-CM | POA: Diagnosis present

## 2021-03-06 DIAGNOSIS — Z79899 Other long term (current) drug therapy: Secondary | ICD-10-CM | POA: Diagnosis not present

## 2021-03-06 DIAGNOSIS — W1811XA Fall from or off toilet without subsequent striking against object, initial encounter: Secondary | ICD-10-CM | POA: Diagnosis present

## 2021-03-06 DIAGNOSIS — I6389 Other cerebral infarction: Secondary | ICD-10-CM | POA: Diagnosis present

## 2021-03-06 DIAGNOSIS — R911 Solitary pulmonary nodule: Secondary | ICD-10-CM | POA: Diagnosis present

## 2021-03-06 DIAGNOSIS — E785 Hyperlipidemia, unspecified: Secondary | ICD-10-CM | POA: Diagnosis present

## 2021-03-06 DIAGNOSIS — R296 Repeated falls: Secondary | ICD-10-CM | POA: Diagnosis present

## 2021-03-06 DIAGNOSIS — R4701 Aphasia: Secondary | ICD-10-CM

## 2021-03-06 DIAGNOSIS — D509 Iron deficiency anemia, unspecified: Secondary | ICD-10-CM | POA: Diagnosis present

## 2021-03-06 DIAGNOSIS — I10 Essential (primary) hypertension: Secondary | ICD-10-CM | POA: Diagnosis present

## 2021-03-06 DIAGNOSIS — R479 Unspecified speech disturbances: Secondary | ICD-10-CM | POA: Diagnosis not present

## 2021-03-06 DIAGNOSIS — F32A Depression, unspecified: Secondary | ICD-10-CM | POA: Diagnosis present

## 2021-03-06 DIAGNOSIS — I639 Cerebral infarction, unspecified: Secondary | ICD-10-CM | POA: Diagnosis present

## 2021-03-06 DIAGNOSIS — F419 Anxiety disorder, unspecified: Secondary | ICD-10-CM | POA: Diagnosis present

## 2021-03-06 DIAGNOSIS — Z20822 Contact with and (suspected) exposure to covid-19: Secondary | ICD-10-CM | POA: Diagnosis present

## 2021-03-06 DIAGNOSIS — R569 Unspecified convulsions: Secondary | ICD-10-CM

## 2021-03-06 DIAGNOSIS — R29700 NIHSS score 0: Secondary | ICD-10-CM | POA: Diagnosis present

## 2021-03-06 DIAGNOSIS — Z882 Allergy status to sulfonamides status: Secondary | ICD-10-CM | POA: Diagnosis not present

## 2021-03-06 DIAGNOSIS — E876 Hypokalemia: Secondary | ICD-10-CM | POA: Diagnosis present

## 2021-03-06 LAB — BASIC METABOLIC PANEL
Anion gap: 12 (ref 5–15)
BUN: 7 mg/dL — ABNORMAL LOW (ref 8–23)
CO2: 17 mmol/L — ABNORMAL LOW (ref 22–32)
Calcium: 6.8 mg/dL — ABNORMAL LOW (ref 8.9–10.3)
Chloride: 110 mmol/L (ref 98–111)
Creatinine, Ser: 0.63 mg/dL (ref 0.44–1.00)
GFR, Estimated: >60 mL/min (ref >60)
Glucose, Bld: 67 mg/dL — ABNORMAL LOW (ref 70–99)
Potassium: 3.4 mmol/L — ABNORMAL LOW (ref 3.5–5.1)
Sodium: 139 mmol/L (ref 135–145)

## 2021-03-06 LAB — URINALYSIS, ROUTINE W REFLEX MICROSCOPIC
Bilirubin Urine: NEGATIVE
Glucose, UA: NEGATIVE mg/dL
Ketones, ur: 80 mg/dL — AB
Nitrite: NEGATIVE
Protein, ur: NEGATIVE mg/dL
Specific Gravity, Urine: 1.015 (ref 1.005–1.030)
pH: 6 (ref 5.0–8.0)

## 2021-03-06 LAB — CBC
HCT: 37 % (ref 36.0–46.0)
Hemoglobin: 11.2 g/dL — ABNORMAL LOW (ref 12.0–15.0)
MCH: 24.7 pg — ABNORMAL LOW (ref 26.0–34.0)
MCHC: 30.3 g/dL (ref 30.0–36.0)
MCV: 81.5 fL (ref 80.0–100.0)
Platelets: 274 10*3/uL (ref 150–400)
RBC: 4.54 MIL/uL (ref 3.87–5.11)
RDW: 17.9 % — ABNORMAL HIGH (ref 11.5–15.5)
WBC: 7.3 10*3/uL (ref 4.0–10.5)
nRBC: 0 % (ref 0.0–0.2)

## 2021-03-06 LAB — HEMOGLOBIN A1C
Hgb A1c MFr Bld: 5.3 % (ref 4.8–5.6)
Hgb A1c MFr Bld: 5.5 % (ref 4.8–5.6)
Mean Plasma Glucose: 105 mg/dL
Mean Plasma Glucose: 111 mg/dL

## 2021-03-06 MED ORDER — ONDANSETRON HCL 4 MG/2ML IJ SOLN
4.0000 mg | Freq: Four times a day (QID) | INTRAMUSCULAR | Status: DC | PRN
  Administered 2021-03-06 – 2021-03-08 (×2): 4 mg via INTRAVENOUS
  Filled 2021-03-06 (×2): qty 2

## 2021-03-06 MED ORDER — MIRTAZAPINE 15 MG PO TABS
15.0000 mg | ORAL_TABLET | Freq: Every day | ORAL | Status: DC
  Filled 2021-03-06: qty 1

## 2021-03-06 MED ORDER — SERTRALINE HCL 50 MG PO TABS
50.0000 mg | ORAL_TABLET | Freq: Every day | ORAL | Status: DC
  Administered 2021-03-08: 50 mg via ORAL
  Filled 2021-03-06 (×5): qty 1

## 2021-03-06 MED ORDER — ATORVASTATIN CALCIUM 40 MG PO TABS
40.0000 mg | ORAL_TABLET | Freq: Every day | ORAL | Status: DC
  Administered 2021-03-06 – 2021-03-09 (×4): 40 mg via ORAL
  Filled 2021-03-06 (×4): qty 1

## 2021-03-06 MED ORDER — POTASSIUM CHLORIDE CRYS ER 20 MEQ PO TBCR
40.0000 meq | EXTENDED_RELEASE_TABLET | Freq: Once | ORAL | Status: AC
  Administered 2021-03-06: 40 meq via ORAL
  Filled 2021-03-06: qty 2

## 2021-03-06 NOTE — Plan of Care (Signed)
  Problem: Education: Goal: Knowledge of General Education information will improve Description: Including pain rating scale, medication(s)/side effects and non-pharmacologic comfort measures Outcome: Progressing   Problem: Health Behavior/Discharge Planning: Goal: Ability to manage health-related needs will improve Outcome: Progressing   Problem: Clinical Measurements: Goal: Ability to maintain clinical measurements within normal limits will improve Outcome: Progressing Goal: Will remain free from infection Outcome: Progressing Goal: Diagnostic test results will improve Outcome: Progressing Goal: Respiratory complications will improve Outcome: Progressing Goal: Cardiovascular complication will be avoided Outcome: Progressing   Problem: Nutrition: Goal: Adequate nutrition will be maintained Outcome: Progressing   Problem: Coping: Goal: Level of anxiety will decrease Outcome: Progressing   Problem: Elimination: Goal: Will not experience complications related to bowel motility Outcome: Progressing Goal: Will not experience complications related to urinary retention Outcome: Progressing   Problem: Safety: Goal: Ability to remain free from injury will improve Outcome: Progressing   Problem: Education: Goal: Knowledge of disease or condition will improve Outcome: Progressing Goal: Knowledge of secondary prevention will improve Outcome: Progressing Goal: Knowledge of patient specific risk factors addressed and post discharge goals established will improve Outcome: Progressing Goal: Individualized Educational Video(s) Outcome: Progressing   Problem: Self-Care: Goal: Ability to participate in self-care as condition permits will improve Outcome: Progressing Goal: Verbalization of feelings and concerns over difficulty with self-care will improve Outcome: Progressing Goal: Ability to communicate needs accurately will improve Outcome: Progressing   Problem: Intracerebral  Hemorrhage Tissue Perfusion: Goal: Complications of Intracerebral Hemorrhage will be minimized Outcome: Progressing

## 2021-03-06 NOTE — Evaluation (Signed)
Occupational Therapy Evaluation Patient Details Name: Elaine Rivera MRN: [id number] DOB: 1946-02-07 Today's Date: 03/06/2021    History of Present Illness Pt is a 75 y/o female admitted secondary to falls, weakness, and dizziness. 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 PMH includes hypertension, cerebral aneurysm with surgical clipping in 1998 and right PICA aneurysm embolization with stent in 2019, GERD, depression, and anxiety.   Clinical Impression   Patient admitted for the diagnosis above.  PTA she lived alone, with check ins from her daughter and boyfriend.  Her daughter and boyfriend assisted with community mobility and groceries, but the patient was otherwise independent with mobility, ADL and IADL.  Barriers are listed below, and she continues to have seizure like activity.  Currently she is needing up to [place] for basic mobility, and up to Mod A for ADL completion at a sit/stand level.  OT will continue to follow in the acute setting to maximize functional status, but CIR has been recommended for post acute rehab prior to returning home.      Follow Up Recommendations  CIR    Equipment Recommendations  Tub/shower seat    Recommendations for Other Services       Precautions / Restrictions Precautions Precautions: Fall Precaution Comments: EEG Restrictions Weight Bearing Restrictions: No      Mobility Bed Mobility Overal bed mobility: Needs Assistance Bed Mobility: Supine to Sit;Sit to Supine     Supine to sit: Min guard Sit to supine: Min guard   General bed mobility comments: min assist for return to supine for LE lifting into bed, boost up. Min guard for supine>sit for safety, PT managing lines/leads/EEG throughout Patient Response: Anxious  Transfers Overall transfer level: Needs assistance Equipment used: 1 person hand held assist Transfers: Sit to/from Stand Sit to Stand: Min assist;Mod assist          General transfer comment: unsteady in stand, and very unsure about her footing and balance.    Balance Overall balance assessment: Needs assistance Sitting-balance support: Bilateral upper extremity supported;Feet supported Sitting balance-Leahy Scale: Fair     Standing balance support: Single extremity supported;During functional activity Standing balance-Leahy Scale: Poor Standing balance comment: currenlty needing external support                           ADL either performed or assessed with clinical judgement   ADL Overall ADL's : Needs assistance/impaired Eating/Feeding: Set up   Grooming: Wash/dry hands;Wash/dry face;Sitting;Min guard           Upper Body Dressing : Minimal assistance;Sitting   Lower Body Dressing: Minimal assistance;Sit to/from stand               Functional mobility during ADLs: Minimal assistance General ADL Comments: hand held assist     Vision Baseline Vision/History: Wears glasses Wears Glasses: Distance only Patient Visual Report: No change from baseline Additional Comments: glasses currently lost.     Perception     Praxis      Pertinent Vitals/Pain Pain Assessment: No/denies pain Faces Pain Scale: No hurt Pain Intervention(s): Monitored during session     Hand Dominance Right   Extremity/Trunk Assessment Upper Extremity Assessment Upper Extremity Assessment: Overall WFL for tasks assessed   Lower Extremity Assessment Lower Extremity Assessment: Defer to PT evaluation LLE Deficits / Details: patient reports fractures to both wrists after falls in the past.   Cervical / Trunk Assessment Cervical /  Trunk Assessment: Normal   Communication Communication Communication: Expressive difficulties   Cognition Arousal/Alertness: Awake/alert Behavior During Therapy: WFL for tasks assessed/performed Overall Cognitive Status: Within Functional Limits for tasks assessed Area of Impairment: Attention;Following  commands;Safety/judgement;Problem solving                   Current Attention Level: Sustained   Following Commands: Follows multi-step commands inconsistently;Follows multi-step commands with increased time Safety/Judgement: Decreased awareness of deficits;Decreased awareness of safety   Problem Solving: Requires verbal cues    General Comments  patient did report seizure like activity with attempted mobiltiy with PT earlier today.    Exercises     Shoulder Instructions      Home Living Family/patient expects to be discharged to:: Private residence Living Arrangements: Alone Available Help at Discharge: Family;Available PRN/intermittently;Friend(s) Type of Home: House Home Access: Stairs to enter [employer] of Steps: 2 Entrance Stairs-Rails: Right;Left Home Layout: One level     Bathroom Shower/Tub: Teacher, early years/pre: Standard     Home Equipment: Bedside commode          Prior Functioning/Environment Level of Independence: Independent        Comments: patient has not been driving lately due to losing glasses.  Her SO and daughter assist with community mobility as needed.        OT Problem List: Impaired balance (sitting and/or standing);Decreased safety awareness      OT Treatment/Interventions: Self-care/ADL training;DME and/or AE instruction;Balance training;Therapeutic activities    OT Goals(Current goals can be found in the care plan section) Acute Rehab OT Goals Patient Stated Goal: patient is hoping to go home with initial supervision and assist as needed. OT Goal Formulation: With patient Time For Goal Achievement: 03/20/21 Potential to Achieve Goals: Good ADL Goals Pt Will Perform Grooming: with set-up;sitting;standing Pt Will Perform Upper Body Bathing: with set-up;sitting Pt Will Perform Lower Body Bathing: with set-up;sit to/from stand Pt Will Perform Upper Body Dressing: with set-up;sitting Pt Will Perform  Lower Body Dressing: with set-up;sit to/from stand Pt Will Transfer to Toilet: ambulating;regular height toilet;with modified independence  OT Frequency: Min 2X/week   Barriers to D/C:    none noted       Co-evaluation              AM-PAC OT "6 Clicks" Daily Activity     Outcome Measure Help from another person eating meals?: None Help from another person taking care of personal grooming?: A Little Help from another person toileting, which includes using toliet, bedpan, or urinal?: A Little Help from another person bathing (including washing, rinsing, drying)?: A Little Help from another person to put on and taking off regular upper body clothing?: A Little Help from another person to put on and taking off regular lower body clothing?: A Little 6 Click Score: 19   End of Session Equipment Utilized During Treatment: Gait belt  Activity Tolerance: Patient tolerated treatment well Patient left: in bed;with call bell/phone within reach;with bed alarm set;with family/visitor present  OT Visit Diagnosis: Unsteadiness on feet (R26.81);History of falling (Z91.81);Dizziness and giddiness (R42)                Time: 8182-9937 OT Time Calculation (min): 18 min Charges:  OT General Charges $OT Visit: 1 Visit OT Evaluation $OT Eval Moderate Complexity: 1 Mod  03/06/2021  Rich, OTR/L  Acute Rehabilitation Services  Office:  [place] 03/06/2021, 1:29 PM

## 2021-03-06 NOTE — Procedures (Addendum)
Patient Name: Elaine Rivera  MRN: [id number]  Epilepsy Attending: Lora Havens  Referring Physician/Provider: Dr Mitzi Hansen Duration: 03/05/2021 1550 to 03/06/2021 1237   Patient history: 75yo F with transient aphasia. EEG to evaluate for seizure   Level of alertness: Awake, asleep   AEDs during EEG study: LEV   Technical aspects: This EEG study was done with scalp electrodes positioned according to the 10-20 International system of electrode placement. Electrical activity was acquired at a sampling rate of 500Hz  and reviewed with a high frequency filter of 70Hz  and a low frequency filter of 1Hz . EEG data were recorded continuously and digitally stored.   Description: The posterior dominant rhythm consists of 9 Hz activity of moderate voltage (25-35 uV) seen predominantly in posterior head regions, symmetric and reactive to eye opening and eye closing.  Sleep was characterized by vertex waves, sleep spindles (12 to 14 Hz), maximal frontocentral region.  One seizure was recorded on 03/05/2021 at 1726 during which patient was sitting in bed, trying to speak but having some trouble.  Concomitant EEG showed rhythmic sharply contoured 7-8hz  alpha activity arising from left centro-temporal region  which evolved into 5-6hz  theta slowing and gradually involved all of left hemisphere The seizure lasted about 1.5 minutes. Hyperventilation and photic stimulation were not performed.      ABNORMALITY - Focal seizure, left centro-temporal region   IMPRESSION: This study showed showed one seizure on 03/05/2021 at 1726 arising from left centro-temporal region during which patient had trouble speaking, lasted for about 1.5 minutes.    Kwane Rohl Barbra Sarks

## 2021-03-06 NOTE — Progress Notes (Signed)
LTM EEG discontinued - no skin breakdown at unhook.   

## 2021-03-06 NOTE — Progress Notes (Signed)
PROGRESS NOTE    Elaine Rivera  DGU:[id number] DOB: 02/21/1946 DOA: 03/04/2021 PCP: Carol Ada, MD   Brief Narrative: Elaine Rivera is a 75 y.o. 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 Patient presented secondary to falls, weakness and speech difficulty found to have a subacute stroke in addition to evidence of focal seizure. Neurology consulted for LTM EEG. Patient started on Keppra IV.   Assessment & Plan:   Principal Problem:   Ischemic cerebrovascular accident (CVA) ([place]) Active Problems:   Depression   HTN (hypertension)   Hypokalemia   Anxiety   Transient speech disturbance   Lung nodule   Fall   Subacute ischemic CVA MRI significant for evidence of small subacute right cerebellar infarct. Hemoglobin A1C of 5.5%. LDL of 101. Transthoracic Echocardiogram significant for no evidence of intracardiac embolic source. PT/OT recommending CIR.  -Neurology recommendations: pending today -Start Lipitor 40 mg daily  Focal seizures Resultant aphasia. Seizures diagnosed via LTM EEG. Patient started on Keppra load followed by Keppra 500 mg BID -Neurology recommendations: pending today -Continue Keppra 500 mg IV  Hypokalemia Improved with supplementation. Slightly low at 3.4 -Kdur 40 meq x1  Lung nodule Incidental finding. Recommendation for dedicated CT chest for further workup. -CT chest  Depression Anxiety -Continue Zoloft and Remeron -Hold Xanax   DVT prophylaxis: Lovenox Code Status:   Code Status: Full Code Family Communication: Daughter on telephone, but no response Disposition Plan: Discharge possibly to CIR per PT recommendations pending continued neurology recommendations/management   Consultants:  Neurology  Procedures:  LTM EEG  Antimicrobials: None    Subjective: No issues this morning. Reports being alert during her episodes of  aphasia.  Objective: Vitals:   03/05/21 2000 03/05/21 2356 03/06/21 0450 03/06/21 0827  BP: (!) 96/52 105/69 (!) 99/55 105/61  Pulse: 63 68 70 64  Resp: 18 16 19 18   Temp: 98.1 F (36.7 C) 97.8 F (36.6 C) (!) 97.4 F (36.3 C) 98.2 F (36.8 C)  TempSrc: Axillary Axillary Oral   SpO2: 96% 96% 98% 99%  Weight:      Height:        Intake/Output Summary (Last 24 hours) at 03/06/2021 1006 Last data filed at 03/06/2021 0400 Gross per 24 hour  Intake 200 ml  Output 300 ml  Net -100 ml   Filed Weights   03/04/21 1933  Weight: 59 kg    Examination:  General exam: Appears calm and comfortable Respiratory system: Clear to auscultation. Respiratory effort normal. Cardiovascular system: S1 & S2 heard, RRR. No murmurs, rubs, gallops or clicks. Gastrointestinal system: Abdomen is nondistended, soft and nontender. No organomegaly or masses felt. Normal bowel sounds heard. Central nervous system: Alert and oriented. Musculoskeletal: No edema. No calf tenderness Skin: No cyanosis. No rashes Psychiatry: Judgement and insight appear normal. Mood & affect appropriate.     Data Reviewed: I have personally reviewed following labs and imaging studies  CBC Lab Results  Component Value Date   WBC 7.3 03/06/2021   RBC 4.54 03/06/2021   HGB 11.2 (L) 03/06/2021   HCT 37.0 03/06/2021   MCV 81.5 03/06/2021   MCH 24.7 (L) 03/06/2021   PLT 274 03/06/2021   MCHC 30.3 03/06/2021   RDW 17.9 (H) 03/06/2021   LYMPHSABS 1.1 03/01/2020   MONOABS 0.7 03/01/2020   EOSABS 0.1 03/01/2020   BASOSABS 0.0 95/63/8756     Last metabolic panel Lab Results  Component Value Date   NA  139 03/06/2021   K 3.4 (L) 03/06/2021   CL 110 03/06/2021   CO2 17 (L) 03/06/2021   BUN 7 (L) 03/06/2021   CREATININE 0.63 03/06/2021   GLUCOSE 67 (L) 03/06/2021   GFRNONAA >60 03/06/2021   GFRAA >60 03/01/2020   CALCIUM 6.8 (L) 03/06/2021   PROT 6.8 03/04/2021   ALBUMIN 3.8 03/04/2021   BILITOT 0.7 03/04/2021    ALKPHOS 80 03/04/2021   AST 40 03/04/2021   ALT 24 03/04/2021   ANIONGAP 12 03/06/2021    CBG (last 3)  No results for input(s): GLUCAP in the last 72 hours.   GFR: Estimated Creatinine Clearance: 47.5 mL/min (by C-G formula based on SCr of 0.63 mg/dL).  Coagulation Profile: No results for input(s): INR, PROTIME in the last 168 hours.  Recent Results (from the past 240 hour(s))  Resp Panel by RT-PCR (Flu A&B, Covid) Nasopharyngeal Swab     Status: None   Collection Time: 03/05/21  2:13 AM   Specimen: Nasopharyngeal Swab; Nasopharyngeal(NP) swabs in vial transport medium  Result Value Ref Range Status   SARS Coronavirus 2 by RT PCR NEGATIVE NEGATIVE Final    Comment: (NOTE) SARS-CoV-2 target nucleic acids are NOT DETECTED.  The SARS-CoV-2 RNA is generally detectable in upper respiratory specimens during the acute phase of infection. The lowest concentration of SARS-CoV-2 viral copies this assay can detect is 138 copies/mL. A negative result does not preclude SARS-Cov-2 infection and should not be used as the sole basis for treatment or other patient management decisions. A negative result may occur with  improper specimen collection/handling, submission of specimen other than nasopharyngeal swab, presence of viral mutation(s) within the areas targeted by this assay, and inadequate number of viral copies(<138 copies/mL). A negative result must be combined with clinical observations, patient history, and epidemiological information. The expected result is Negative.  Fact Sheet for Patients:  EntrepreneurPulse.com.au  Fact Sheet for Healthcare Providers:  IncredibleEmployment.be  This test is no t yet approved or cleared by the [place] FDA and  has been authorized for detection and/or diagnosis of SARS-CoV-2 by FDA under an Emergency Use Authorization (EUA). This EUA will remain  in effect (meaning this test can be used) for the  duration of the COVID-19 declaration under Section 564(b)(1) of the Act, 21 U.S.C.section 360bbb-3(b)(1), unless the authorization is terminated  or revoked sooner.       Influenza A by PCR NEGATIVE NEGATIVE Final   Influenza B by PCR NEGATIVE NEGATIVE Final    Comment: (NOTE) The Xpert Xpress SARS-CoV-2/FLU/RSV plus assay is intended as an aid in the diagnosis of influenza from Nasopharyngeal swab specimens and should not be used as a sole basis for treatment. Nasal washings and aspirates are unacceptable for Xpert Xpress SARS-CoV-2/FLU/RSV testing.  Fact Sheet for Patients: EntrepreneurPulse.com.au  Fact Sheet for Healthcare Providers: IncredibleEmployment.be  This test is not yet approved or cleared by the [place] FDA and has been authorized for detection and/or diagnosis of SARS-CoV-2 by FDA under an Emergency Use Authorization (EUA). This EUA will remain in effect (meaning this test can be used) for the duration of the COVID-19 declaration under Section 564(b)(1) of the Act, 21 U.S.C. section 360bbb-3(b)(1), unless the authorization is terminated or revoked.  Performed at [facility], [place] [address], [place],  [address]         Radiology Studies: CT Angio Head W or Wo Contrast  Result Date: 03/05/2021 CLINICAL DATA:  Initial evaluation for neuro deficit. EXAM: CT  ANGIOGRAPHY HEAD AND NECK TECHNIQUE: Multidetector CT imaging of the head and neck was performed using the standard protocol during bolus administration of intravenous contrast. Multiplanar CT image reconstructions and MIPs were obtained to evaluate the vascular anatomy. Carotid stenosis measurements (when applicable) are obtained utilizing NASCET criteria, using the distal internal carotid diameter as the denominator. CONTRAST:  163mL OMNIPAQUE IOHEXOL 350 MG/ML SOLN COMPARISON:  Prior CT from earlier the same day. FINDINGS: CTA NECK FINDINGS  Aortic arch: Visualized arch normal caliber with normal 3 vessel morphology. Mild atheromatous change about the arch itself. No significant stenosis about the origin of the great vessels. Right carotid system: Right common and internal carotid arteries widely patent without stenosis, dissection or occlusion. Left carotid system: Left common and internal carotid arteries widely patent without stenosis, dissection or occlusion. Vertebral arteries: Both vertebral arteries arise from the subclavian arteries. Right vertebral artery dominant. No significant proximal subclavian artery stenosis. Vertebral arteries patent within the neck without stenosis, dissection or occlusion. Skeleton: No visible acute osseous finding. No discrete or worrisome osseous lesions. Severe thoracic scoliosis partially visualized. Other neck: No other acute soft tissue abnormality within the neck. Probable right vocal cord paresis noted. Upper chest: Scattered atelectatic changes noted within the partially visualized left lower lobe. 7 mm nodular density partially visualized within the right lower lobe (series 1, image 1). Linear atelectasis versus scarring present at the right lung apex. Suspected hiatal hernia partially visualized. Review of the MIP images confirms the above findings CTA HEAD FINDINGS Anterior circulation: Petrous, cavernous, and supraclinoid segments widely patent without stenosis. A1 segments patent bilaterally. Normal anterior communicating artery complex. Anterior cerebral arteries patent to their distal aspects without stenosis. No M1 stenosis or occlusion. Normal MCA bifurcations. Distal MCA branches well perfused and symmetric. Posterior circulation: Pipeline stent in place within the right V4 segment, extending from the dural reflection to the vertebrobasilar junction. Patent flow seen through the stent. 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 Residual aneurysm filling measures 6 x 7 x 6 mm, previously 6 x 6 x  6 mm on prior CTA from 10/03/2018. Hypoplastic left vertebral artery remains widely patent. Left PICA origin patent and normal. Basilar widely patent. Superior cerebellar arteries patent bilaterally. Both PCA supplied via the basilar as well as bilateral posterior communicating arteries, larger on the right. PCAs remain well perfused to their distal aspects. Venous sinuses: Patent allowing for timing of the contrast bolus. Anatomic variants: Mildly hypoplastic right P1 segment with robust right posterior communicating artery. No new aneurysm. Review of the MIP images confirms the above findings IMPRESSION: 1. Negative CTA for large vessel occlusion. 2. Sequelae of prior endovascular treatment of right PICA aneurysm with pipeline stent in place within right V4 segment, stable in position. Residual aneurysm filling measuring 6 x 7 x 6 mm, little interval changed, previously measuring 6 x 6 x 6 mm on CTA from 10/03/2018. 3. Otherwise stable and negative CTA of the head and neck. No other hemodynamically significant or correctable stenosis. 4. 7 mm right lower lobe nodule, partially visualized. Further evaluation with dedicated cross-sectional imaging of the chest suggested for further evaluation. Electronically Signed   By: Jeannine Boga M.D.   On: 03/05/2021 00:00   CT Head Wo Contrast  Result Date: 03/04/2021 CLINICAL DATA:  Dizziness, fall, head injury EXAM: CT HEAD WITHOUT CONTRAST TECHNIQUE: Contiguous axial images were obtained from the base of the skull through the vertex without intravenous contrast. COMPARISON:  MRI 11/17/2019 FINDINGS: Brain: Right suboccipital craniotomy has been  performed. A hyperdense mass is seen within the right cerebellum medullary angle measuring 1.5 x 2.1 cm at axial image # 7/2 compatible with the known PICA aneurysm and demonstrating interval growth since prior MRI examination where this measured roughly 12 mm x 18 mm. A vascular stent compatible with a pipeline stent is  seen within the right V4 segment of the vertebral artery extending to the origin of the basilar artery. No evidence of acute intracranial hemorrhage or infarct. No abnormal mass effect or midline shift. No abnormal intra or extra-axial fluid collection identified. There is mild ventriculomegaly again identified which appears disproportionate to the degree of cerebral atrophy suggesting underlying communicating hydrocephalus. Mild parenchymal volume loss is again noted. Moderate periventricular white matter changes are again seen, possibly related to small vessel ischemic change. Vascular: No asymmetric hyperdense vasculature at the skull base. Skull: There is no acute fracture identified. Sinuses/Orbits: There is partial opacification of the right sphenoid sinus. Mucous retention cyst noted within the left maxillary sinus. Remaining paranasal sinuses are clear. Orbits are unremarkable. Other: Mastoid air cells and middle ear cavities are clear. 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 The aneurysm sac appears enlarged versus remote prior examination, though intervening examines are not available to determine interval change prior to endovascular therapy. The aneurysm sac demonstrates interval increase in size since prior examination measuring 21 mm in greatest dimension. Patency of the aneurysm sac is not well assessed on this examination and would be better assessed with MRI or formal arteriography. Stable mild ventriculomegaly disproportionate to the degree of cerebral atrophy. Clinical correlation for signs and symptoms of normal pressure hydrocephalus may be helpful. No acute intracranial hemorrhage or infarct. Stable mild to moderate senescent change. Mild paranasal sinus disease. Electronically Signed   By: Fidela Salisbury MD   On: 03/04/2021 20:31   CT Angio Neck W and/or Wo Contrast  Result Date: 03/05/2021 CLINICAL DATA:  Initial evaluation for neuro deficit. EXAM: CT  ANGIOGRAPHY HEAD AND NECK TECHNIQUE: Multidetector CT imaging of the head and neck was performed using the standard protocol during bolus administration of intravenous contrast. Multiplanar CT image reconstructions and MIPs were obtained to evaluate the vascular anatomy. Carotid stenosis measurements (when applicable) are obtained utilizing NASCET criteria, using the distal internal carotid diameter as the denominator. CONTRAST:  160mL OMNIPAQUE IOHEXOL 350 MG/ML SOLN COMPARISON:  Prior CT from earlier the same day. FINDINGS: CTA NECK FINDINGS Aortic arch: Visualized arch normal caliber with normal 3 vessel morphology. Mild atheromatous change about the arch itself. No significant stenosis about the origin of the great vessels. Right carotid system: Right common and internal carotid arteries widely patent without stenosis, dissection or occlusion. Left carotid system: Left common and internal carotid arteries widely patent without stenosis, dissection or occlusion. Vertebral arteries: Both vertebral arteries arise from the subclavian arteries. Right vertebral artery dominant. No significant proximal subclavian artery stenosis. Vertebral arteries patent within the neck without stenosis, dissection or occlusion. Skeleton: No visible acute osseous finding. No discrete or worrisome osseous lesions. Severe thoracic scoliosis partially visualized. Other neck: No other acute soft tissue abnormality within the neck. Probable right vocal cord paresis noted. Upper chest: Scattered atelectatic changes noted within the partially visualized left lower lobe. 7 mm nodular density partially visualized within the right lower lobe (series 1, image 1). Linear atelectasis versus scarring present at the right lung apex. Suspected hiatal hernia partially visualized. Review of the MIP images confirms the above findings CTA HEAD FINDINGS Anterior circulation: Petrous, cavernous,  and supraclinoid segments widely patent without stenosis. A1  segments patent bilaterally. Normal anterior communicating artery complex. Anterior cerebral arteries patent to their distal aspects without stenosis. No M1 stenosis or occlusion. Normal MCA bifurcations. Distal MCA branches well perfused and symmetric. Posterior circulation: Pipeline stent in place within the right V4 segment, extending from the dural reflection to the vertebrobasilar junction. Patent flow seen through the stent. 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 Residual aneurysm filling measures 6 x 7 x 6 mm, previously 6 x 6 x 6 mm on prior CTA from 10/03/2018. Hypoplastic left vertebral artery remains widely patent. Left PICA origin patent and normal. Basilar widely patent. Superior cerebellar arteries patent bilaterally. Both PCA supplied via the basilar as well as bilateral posterior communicating arteries, larger on the right. PCAs remain well perfused to their distal aspects. Venous sinuses: Patent allowing for timing of the contrast bolus. Anatomic variants: Mildly hypoplastic right P1 segment with robust right posterior communicating artery. No new aneurysm. Review of the MIP images confirms the above findings IMPRESSION: 1. Negative CTA for large vessel occlusion. 2. Sequelae of prior endovascular treatment of right PICA aneurysm with pipeline stent in place within right V4 segment, stable in position. Residual aneurysm filling measuring 6 x 7 x 6 mm, little interval changed, previously measuring 6 x 6 x 6 mm on CTA from 10/03/2018. 3. Otherwise stable and negative CTA of the head and neck. No other hemodynamically significant or correctable stenosis. 4. 7 mm right lower lobe nodule, partially visualized. Further evaluation with dedicated cross-sectional imaging of the chest suggested for further evaluation. Electronically Signed   By: Jeannine Boga M.D.   On: 03/05/2021 00:00   MR BRAIN WO CONTRAST  Result Date: 03/05/2021 CLINICAL DATA:  Initial evaluation for acute TIA. EXAM:  MRI HEAD WITHOUT CONTRAST TECHNIQUE: Multiplanar, multiecho pulse sequences of the brain and surrounding structures were obtained without intravenous contrast. COMPARISON:  Prior CTs from 03/04/2021 and MRI from 11/17/2019. FINDINGS: Brain: Diffuse prominence of the CSF containing spaces compatible with generalized cerebral atrophy. Patchy and confluent T2/FLAIR hyperintensity within the periventricular and deep white matter of both cerebral hemispheres as well as the pons, most consistent with chronic microvascular ischemic disease, moderate in nature. There is a subtle 12 mm linear focus of diffusion abnormality involving the right cerebellum (series 5, image 56) associated T2/FLAIR signal abnormality without ADC correlate, suggesting a small subacute ischemic infarct. This is new as compared to previous MRI. No other diffusion abnormality to suggest acute or subacute ischemia. Gray-white matter differentiation otherwise maintained. No other areas of encephalomalacia to suggest chronic cortical infarction. No findings to suggest acute intracranial hemorrhage. No mass lesion, mass effect, or midline shift. Diffuse ventricular prominence somewhat out of proportion to cortical sulcation, similar to previous. No extra-axial fluid collection. Pituitary gland suprasellar region normal. Midline structures intact. 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 The aneurysm sac is little interval changed in overall size measuring 17 x 12 x 12 mm (previously 17 x 11 x 11 mm). 7 mm residual flow void within the aneurysm sac better appreciated on prior CTA. Patent flow void through the right the for pipeline stent. Hypoplastic left vertebral artery noted. Major intracranial vascular flow voids otherwise well maintained. Skull and upper cervical spine: Craniocervical junction within normal limits. Bone marrow signal intensity mildly heterogeneous but within normal limits. Prior right suboccipital craniectomy  noted. No scalp soft tissue abnormality. Sinuses/Orbits: Globes and orbital soft tissues demonstrate no acute finding. Left maxillary sinus retention  cyst. Scattered mucosal thickening noted within the sphenoid ethmoidal sinuses. No mastoid effusion. Inner ear structures grossly normal. Other: None. IMPRESSION: 1. 12 mm linear focus of diffusion abnormality involving the right cerebellum, suggesting a small subacute ischemic infarct. This is new as compared to previous MRI from 11/17/2019. 2. No other acute intracranial abnormality. 3. Partially thrombosed right PICA aneurysm, relatively stable in size measuring up to 17 mm with residual 7 mm internal flow void. 4. Diffuse ventricular prominence somewhat out of proportion to cortical sulcation, stable. While this finding may be related to underlying atrophy, a component of normal pressure hydrocephalus could also be considered in the correct clinical setting. 5. Underlying moderate chronic microvascular ischemic disease. 6. Prior right suboccipital craniectomy. Electronically Signed   By: Jeannine Boga M.D.   On: 03/05/2021 01:06   EEG adult  Result Date: 03/05/2021 Lora Havens, MD     03/05/2021  5:33 PM Patient Name: CATHERINE CUBERO MRN: [id number] Epilepsy Attending: Lora Havens Referring Physician/Provider: Dr Mitzi Hansen Date: 03/05/2021 Duration: 24.09 mins Patient history: 75yo F with transient aphasia. EEG to evaluate for seizure Level of alertness: Awake AEDs during EEG study: None Technical aspects: This EEG study was done with scalp electrodes positioned according to the 10-20 International system of electrode placement. Electrical activity was acquired at a sampling rate of 500Hz  and reviewed with a high frequency filter of 70Hz  and a low frequency filter of 1Hz . EEG data were recorded continuously and digitally stored. Description: The posterior dominant rhythm consists of 9 Hz activity of moderate voltage (25-35 uV) seen  predominantly in posterior head regions, symmetric and reactive to eye opening and eye closing. Event button was pressed at 1543 for speech disturbance,unable to answer questions. Concomitant EEG showed rhythmic sharply contoured 7-8hz  alpha activity arising from left centro-temporal region  which evolved into 5-6hz  theta slowing  which gradually involved all of left hemisphere consistent with focal seizure. The seizure started at 1542 and lasted about 6 minutes. Hyperventilation and photic stimulation were not performed.   ABNORMALITY - Focal seizure, left centro-temporal region IMPRESSION: This study showed showed one seizure on 03/05/2021 at 1542 arising from left centro-temporal region during which patient was unable to speak, lasted for about 6 minutes. Dr Myna Hidalgo was notified. Lora Havens   ECHOCARDIOGRAM COMPLETE  Result Date: 03/05/2021    ECHOCARDIOGRAM REPORT   Patient Name:   SOLOMIA HARRELL Date of Exam: 03/05/2021 Medical Rec #:  [id number]         Height:       59.0 in Accession #:    [id number]        Weight:       130.0 lb Date of Birth:  [birth year]         BSA:          1.536 m Patient Age:    63 years          BP:           132/72 mmHg Patient Gender: F                 HR:           64 bpm. Exam Location:  Inpatient Procedure: 2D Echo, Cardiac Doppler and Color Doppler Indications:    Stroke  History:        Patient has prior history of Echocardiogram examinations, most                 recent  02/29/2020. Risk Factors:Hypertension.  Sonographer:    Cammy Brochure Referring Phys: [id number] [place]  1. Left ventricular ejection fraction, by estimation, is 60 to 65%. The left ventricle has normal function. The left ventricle has no regional wall motion abnormalities. There is mild left ventricular hypertrophy. Left ventricular diastolic parameters are consistent with Grade I diastolic dysfunction (impaired relaxation).  2. Right ventricular systolic function is normal. The right  ventricular size is normal.  3. The mitral valve is normal in structure. Mild mitral valve regurgitation. No evidence of mitral stenosis.  4. The aortic valve is normal in structure. Aortic valve regurgitation is not visualized. No aortic stenosis is present.  5. The inferior vena cava is normal in size with greater than 50% respiratory variability, suggesting right atrial pressure of 3 mmHg. Comparison(s): No significant change from prior study. Prior images reviewed side by side. Conclusion(s)/Recommendation(s): No intracardiac source of embolism detected on this transthoracic study. A transesophageal echocardiogram is recommended to exclude cardiac source of embolism if clinically indicated. FINDINGS  Left Ventricle: Left ventricular ejection fraction, by estimation, is 60 to 65%. The left ventricle has normal function. The left ventricle has no regional wall motion abnormalities. The left ventricular internal cavity size was normal in size. There is  mild left ventricular hypertrophy. Left ventricular diastolic parameters are consistent with Grade I diastolic dysfunction (impaired relaxation). Right Ventricle: The right ventricular size is normal. No increase in right ventricular wall thickness. Right ventricular systolic function is normal. Left Atrium: Left atrial size was normal in size. Right Atrium: Right atrial size was normal in size. Pericardium: There is no evidence of pericardial effusion. Mitral Valve: The mitral valve is normal in structure. Mild mitral valve regurgitation. No evidence of mitral valve stenosis. MV peak gradient, 9.2 mmHg. The mean mitral valve gradient is 3.0 mmHg. Tricuspid Valve: The tricuspid valve is normal in structure. Tricuspid valve regurgitation is not demonstrated. No evidence of tricuspid stenosis. Aortic Valve: The aortic valve is normal in structure. Aortic valve regurgitation is not visualized. No aortic stenosis is present. Aortic valve mean gradient measures 4.0 mmHg.  Aortic valve peak gradient measures 7.1 mmHg. Aortic valve area, by VTI measures 2.41 cm. Pulmonic Valve: The pulmonic valve was normal in structure. Pulmonic valve regurgitation is not visualized. No evidence of pulmonic stenosis. Aorta: The aortic root is normal in size and structure. Venous: The inferior vena cava is normal in size with greater than 50% respiratory variability, suggesting right atrial pressure of 3 mmHg. IAS/Shunts: No atrial level shunt detected by color flow Doppler.  LEFT VENTRICLE PLAX 2D LVIDd:         3.60 cm  Diastology LVIDs:         2.50 cm  LV e' medial:    5.98 cm/s LV PW:         1.10 cm  LV E/e' medial:  16.1 LV IVS:        1.30 cm  LV e' lateral:   9.46 cm/s LVOT diam:     1.90 cm  LV E/e' lateral: 10.1 LV SV:         67 LV SV Index:   43 LVOT Area:     2.84 cm  RIGHT VENTRICLE RV Basal diam:  2.70 cm RV S prime:     14.40 cm/s TAPSE (M-mode): 2.2 cm LEFT ATRIUM             Index       RIGHT ATRIUM  Index LA diam:        3.30 cm 2.15 cm/m  RA Area:     9.18 cm LA Vol (A2C):   36.0 ml 23.44 ml/m RA Volume:   16.20 ml 10.55 ml/m LA Vol (A4C):   38.3 ml 24.94 ml/m LA Biplane Vol: 37.3 ml 24.29 ml/m  AORTIC VALVE AV Area (Vmax):    2.26 cm AV Area (Vmean):   2.19 cm AV Area (VTI):     2.41 cm AV Vmax:           133.00 cm/s AV Vmean:          88.400 cm/s AV VTI:            0.276 m AV Peak Grad:      7.1 mmHg AV Mean Grad:      4.0 mmHg LVOT Vmax:         106.00 cm/s LVOT Vmean:        68.400 cm/s LVOT VTI:          0.235 m LVOT/AV VTI ratio: 0.85  AORTA Ao Root diam: 2.80 cm Ao Asc diam:  3.80 cm MITRAL VALVE                TRICUSPID VALVE MV Area (PHT): 3.48 cm     TR Peak grad:   26.8 mmHg MV Area VTI:   1.77 cm     TR Vmax:        259.00 cm/s MV Peak grad:  9.2 mmHg MV Mean grad:  3.0 mmHg     SHUNTS MV Vmax:       1.52 m/s     Systemic VTI:  0.24 m MV Vmean:      75.3 cm/s    Systemic Diam: 1.90 cm MV Decel Time: 218 msec MV E velocity: 96.00 cm/s MV A velocity:  138.00 cm/s MV E/A ratio:  0.70 Candee Furbish MD Electronically signed by Candee Furbish MD Signature Date/Time: 03/05/2021/2:22:34 PM    Final         Scheduled Meds:   stroke: mapping our early stages of recovery book   Does not apply Once   enoxaparin (LOVENOX) injection  40 mg Subcutaneous Q24H   Continuous Infusions:  levETIRAcetam 500 mg (03/06/21 0817)     LOS: 0 days     Cordelia Poche, MD [facility] 03/06/2021, 10:06 AM  If 7PM-7AM, please contact night-coverage [email or web address]

## 2021-03-06 NOTE — Plan of Care (Signed)
  Problem: Education: Goal: Knowledge of General Education information will improve Description: Including pain rating scale, medication(s)/side effects and non-pharmacologic comfort measures Outcome: Progressing   Problem: Health Behavior/Discharge Planning: Goal: Ability to manage health-related needs will improve Outcome: Progressing   Problem: Clinical Measurements: Goal: Ability to maintain clinical measurements within normal limits will improve Outcome: Progressing Goal: Will remain free from infection Outcome: Progressing Goal: Diagnostic test results will improve Outcome: Progressing Goal: Respiratory complications will improve Outcome: Progressing Goal: Cardiovascular complication will be avoided Outcome: Progressing   Problem: Activity: Goal: Risk for activity intolerance will decrease Outcome: Progressing   Problem: Nutrition: Goal: Adequate nutrition will be maintained Outcome: Progressing   Problem: Coping: Goal: Level of anxiety will decrease Outcome: Progressing   Problem: Elimination: Goal: Will not experience complications related to bowel motility Outcome: Progressing Goal: Will not experience complications related to urinary retention Outcome: Progressing   Problem: Pain Managment: Goal: General experience of comfort will improve Outcome: Progressing   Problem: Safety: Goal: Ability to remain free from injury will improve Outcome: Progressing   Problem: Skin Integrity: Goal: Risk for impaired skin integrity will decrease Outcome: Progressing   Problem: Education: Goal: Knowledge of disease or condition will improve Outcome: Progressing Goal: Knowledge of secondary prevention will improve Outcome: Progressing Goal: Knowledge of patient specific risk factors addressed and post discharge goals established will improve Outcome: Progressing Goal: Individualized Educational Video(s) Outcome: Progressing   Problem: Coping: Goal: Will verbalize  positive feelings about self Outcome: Progressing Goal: Will identify appropriate support needs Outcome: Progressing   Problem: Self-Care: Goal: Ability to participate in self-care as condition permits will improve Outcome: Progressing Goal: Verbalization of feelings and concerns over difficulty with self-care will improve Outcome: Progressing Goal: Ability to communicate needs accurately will improve Outcome: Progressing   Problem: Intracerebral Hemorrhage Tissue Perfusion: Goal: Complications of Intracerebral Hemorrhage will be minimized Outcome: Progressing

## 2021-03-06 NOTE — Progress Notes (Signed)
Rehab Admissions Coordinator Note:  Patient was screened by Cleatrice Burke for appropriateness for an Inpatient Acute Rehab Consult per therapy recs.   At this time, we are recommending Inpatient Rehab consult. I will place order per protocol.  Cleatrice Burke RN MSN 03/06/2021, 12:54 PM  I can be reached at [phone].

## 2021-03-06 NOTE — Progress Notes (Signed)
Physical Therapy Treatment Patient Details Name: Elaine Rivera MRN: [id number] DOB: 1945/10/26 Today's Date: 03/06/2021    History of Present Illness Pt is a 75 y/o female admitted secondary to falls, weakness, and dizziness. 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 PMH includes hypertension, cerebral aneurysm with surgical clipping in 1998 and right PICA aneurysm embolization with stent in 2019, GERD, depression, and anxiety.    PT Comments    Pt talkative and tangential upon PT arrival to room, eager to mobilize. Pt performing mobility tasks with min-mod assist at this time, with multidirectional unsteadiness noted in standing. Pt tolerated repeating standing trials with PT assist, as well as pre-gait tasks. Upon return to supine after repeated standing, pt states "seizure coming" with x30 seconds decreased responsiveness, rapid and rhythmic tongue movement. After this pt again conversive, RN notified. Of note, pt does not have seizure pads on railings in room, PT recommending seizure pads all rails. PT recommending CIR consult, will continue to follow.    Follow Up Recommendations  CIR     Equipment Recommendations  Other (comment) (TBD pending progression)    Recommendations for Other Services       Precautions / Restrictions Precautions Precautions: Fall Precaution Comments: EEG Restrictions Weight Bearing Restrictions: No    Mobility  Bed Mobility Overal bed mobility: Needs Assistance Bed Mobility: Supine to Sit;Sit to Supine     Supine to sit: Min guard Sit to supine: Min assist   General bed mobility comments: min assist for return to supine for LE lifting into bed, boost up. Min guard for supine>sit for safety, PT managing lines/leads/EEG throughout    Transfers Overall transfer level: Needs assistance Equipment used: 1 person hand held assist Transfers: Sit to/from Stand Sit to Stand: Mod assist          General transfer comment: Mod assist for rise and steady, with multidirectional unsteadiness especially during dynamic tasks. STS x2, tolerated lateral steps towards HOB x2 and standing marches x5 bilat.  Ambulation/Gait             General Gait Details: nt - limited by EEG   Stairs             Wheelchair Mobility    Modified Rankin (Stroke Patients Only) Modified Rankin (Stroke Patients Only) Pre-Morbid Rankin Score: No significant disability Modified Rankin: Moderately severe disability     Balance Overall balance assessment: Needs assistance Sitting-balance support: Bilateral upper extremity supported;Feet supported Sitting balance-Leahy Scale: Fair     Standing balance support: Single extremity supported;During functional activity Standing balance-Leahy Scale: Poor Standing balance comment: reliant on PT asssist                            Cognition Arousal/Alertness: Awake/alert Behavior During Therapy: WFL for tasks assessed/performed;Flat affect Overall Cognitive Status: No family/caregiver present to determine baseline cognitive functioning Area of Impairment: Attention;Following commands;Safety/judgement;Problem solving                   Current Attention Level: Sustained   Following Commands: Follows one step commands with increased time;Follows multi-step commands inconsistently Safety/Judgement: Decreased awareness of deficits;Decreased awareness of safety     General Comments: Pt tangential in conversation, difficult to keep on task. Pt very pleasant, states "I feel wonderful". Pt repeating back PT cues to PT in questioning tone, at times does not complete multistep commands without repeated cuing.  Exercises      General Comments General comments (skin integrity, edema, etc.): Upon return to supine after repeated standing, pt states "seizure coming" with x30 seconds decreased responsiveness, rapid and rhythmic tongue  movement. After this pt again conversive, RN notified. Of note, pt does not have seizure pads on railings in room, PT recommending seizure pads all rails .      Pertinent Vitals/Pain Pain Assessment: Faces Faces Pain Scale: No hurt Pain Intervention(s): Monitored during session    Home Living                      Prior Function            PT Goals (current goals can now be found in the care plan section) Acute Rehab PT Goals PT Goal Formulation: With patient Time For Goal Achievement: 03/19/21 Potential to Achieve Goals: Fair Progress towards PT goals: Progressing toward goals    Frequency    Min 4X/week      PT Plan Current plan remains appropriate    Co-evaluation              AM-PAC PT "6 Clicks" Mobility   Outcome Measure  Help needed turning from your back to your side while in a flat bed without using bedrails?: A Little Help needed moving from lying on your back to sitting on the side of a flat bed without using bedrails?: A Little Help needed moving to and from a bed to a chair (including a wheelchair)?: A Lot Help needed standing up from a chair using your arms (e.g., wheelchair or bedside chair)?: A Lot Help needed to walk in hospital room?: A Lot Help needed climbing 3-5 steps with a railing? : Total 6 Click Score: 13    End of Session Equipment Utilized During Treatment: Gait belt Activity Tolerance: Patient limited by fatigue;Patient tolerated treatment well Patient left: in bed;with call bell/phone within reach;with bed alarm set Nurse Communication: Mobility status PT Visit Diagnosis: Unsteadiness on feet (R26.81);Muscle weakness (generalized) (M62.81);Difficulty in walking, not elsewhere classified (R26.2)     Time: 6734-1937 PT Time Calculation (min) (ACUTE ONLY): 22 min  Charges:  $Therapeutic Activity: 8-22 mins                    Stacie Glaze, PT DPT Acute Rehabilitation Services Pager [phone]  Office [phone]    Roxine Caddy E  Ruffin Pyo 03/06/2021, 12:32 PM

## 2021-03-06 NOTE — TOC Initial Note (Signed)
Transition of Care [facility]) - Initial/Assessment Note    Patient Details  Name: Elaine Rivera MRN: [id number] Date of Birth: 03/23/1946  [facility] CM/SW Contact:    Pollie Friar, RN Phone Number: 03/06/2021, 2:16 PM  Clinical Narrative:                 Patient states she lives home alone but has a friend that checks on her 2-3 times a day. Friend also provides needed transportation. Pt denies issues with home medications as her pharmacy delivers her meds.  Recommendations are for CIR. TOC following.  Expected Discharge Plan: IP Rehab Facility Barriers to Discharge: Continued Medical Work up   Patient Goals and CMS Choice     Choice offered to / list presented to : Patient  Expected Discharge Plan and Services Expected Discharge Plan: [place]   Discharge Planning Services: CM Consult Post Acute Care Choice: IP Rehab Living arrangements for the past 2 months: Single Family Home                                      Prior Living Arrangements/Services Living arrangements for the past 2 months: Single Family Home Lives with:: Self Patient language and need for interpreter reviewed:: Yes Do you feel safe going back to the place where you live?: Yes        Care giver support system in place?: No (comment) Current home services: DME (walker/ cane) Criminal Activity/Legal Involvement Pertinent to Current Situation/Hospitalization: No - Comment as needed  Activities of Daily Living Home Assistive Devices/Equipment: Eyeglasses, Cane (specify quad or straight), Walker (specify type) (single point cane, front wheeled walker) ADL Screening (condition at time of admission) Patient's cognitive ability adequate to safely complete daily activities?: No (patient having random black out spells lasting about 2 minutes) Is the patient deaf or have difficulty hearing?: No Does the patient have difficulty seeing, even when wearing glasses/contacts?: No Does  the patient have difficulty concentrating, remembering, or making decisions?: No Patient able to express need for assistance with ADLs?: Yes Does the patient have difficulty dressing or bathing?: Yes Independently performs ADLs?: No Communication: Independent Dressing (OT): Needs assistance Is this a change from baseline?: Change from baseline, expected to last >3 days Grooming: Independent Feeding: Needs assistance Is this a change from baseline?: Change from baseline, expected to last >3 days Bathing: Needs assistance Is this a change from baseline?: Change from baseline, expected to last >3 days Toileting: Needs assistance Is this a change from baseline?: Change from baseline, expected to last >3days In/Out Bed: Needs assistance Is this a change from baseline?: Change from baseline, expected to last >3 days Walks in Home: Needs assistance Is this a change from baseline?: Change from baseline, expected to last >3 days Does the patient have difficulty walking or climbing stairs?: Yes (secondary to weakness) Weakness of Legs: Both Weakness of Arms/Hands: None  Permission Sought/Granted                  Emotional Assessment Appearance:: Appears stated age Attitude/Demeanor/Rapport: Engaged Affect (typically observed): Accepting Orientation: : Oriented to Self, Oriented to Place, Oriented to  Time, Oriented to Situation   Psych Involvement: No (comment)  Admission diagnosis:  Aphasia [R47.01] Fall, initial encounter [W19.XXXA] Ischemic cerebrovascular accident (CVA) [facility]) [I63.9] Patient Active Problem List   Diagnosis Date Noted   Ischemic cerebrovascular accident (CVA) ([place]) 03/05/2021  Hypokalemia 03/05/2021   Anxiety 03/05/2021   Transient speech disturbance 03/05/2021   Lung nodule 03/05/2021   Aphasia    Fall    Anemia 02/28/2020   Shortness of breath 01/24/2020   Dizziness 01/24/2020   Herpes 10/04/2018   Facial cellulitis 10/03/2018   Brain aneurysm  09/05/2018   NSAID long-term use 04/20/2016   Scoliosis 04/20/2016   Depression 04/20/2016   HTN (hypertension) 04/20/2016   GERD (gastroesophageal reflux disease) 04/20/2016   Occult GI bleeding 04/20/2016   PCP:  Carol Ada, MD Pharmacy:   [facility] [place] [address] Phone: [phone] Fax: [phone]     Social Determinants of Health (SDOH) Interventions    Readmission Risk Interventions No flowsheet data found.

## 2021-03-06 NOTE — Progress Notes (Signed)
Neurology Progress Note Subjective: No acute overnight events EEG with one seizure at 1542 and one seizure at 1726 with speech arrest lasting approximately 1.5 minutes. Seizures arising from left centro-temporal region.  Keppra initiated overnight Patient endorses improvement in word finding difficulties overnight   Exam: Vitals:   03/06/21 0827 03/06/21 1121  BP: 105/61 114/71  Pulse: 64 72  Resp: 18 20  Temp: 98.2 F (36.8 C) 98 F (36.7 C)  SpO2: 99% 100%   Gen: Laying comfortably in bed, in no acute distress Resp: non-labored breathing, no respiratory distress Abd: soft, non-tender  Neuro: Mental Status: Awake, alert, and oriented x3. Patient is able to give a clear and coherent history of present illness. Speech is intact with mild dysarthria and hypophonia. No aphasia is noted.  Naming, comprehension, and repetition intact.  Brief speech pauses noted in conversation but none > 10 seconds on assessment.  On assessment, patient did not experience speech arrest as observed during seizure events yesterday CN: PERRL 4 mm / brisk, EOMI with saccadic movements, visual fields full, facial sensation intact and symmetric to light touch, face is symmetric resting and smiling, hearing is intact to voice, shoulders shrug symmetrically, palate elevates symmetrically, tongue protrudes midline Motor: 5/5 strength present in bilateral upper and lower extremities without asymmetry. Fine tremor noted with intention of bilateral hands. No vertical drift present throughout. Improved pain of left shoulder with LUE movement today compared to yesterday.  Sensory: Intact to light touch bilaterally in all four extremities. DTRs: 1+ and symmetric patellae, 2+ and symmetric biceps  Gait: deferred  Pertinent Labs: CBC    Component Value Date/Time   WBC 7.3 03/06/2021 0356   RBC 4.54 03/06/2021 0356   HGB 11.2 (L) 03/06/2021 0356   HCT 37.0 03/06/2021 0356   PLT 274 03/06/2021 0356   MCV 81.5  03/06/2021 0356   MCH 24.7 (L) 03/06/2021 0356   MCHC 30.3 03/06/2021 0356   RDW 17.9 (H) 03/06/2021 0356   LYMPHSABS 1.1 03/01/2020 0525   MONOABS 0.7 03/01/2020 0525   EOSABS 0.1 03/01/2020 0525   BASOSABS 0.0 03/01/2020 0525   CMP     Component Value Date/Time   NA 139 03/06/2021 0356   K 3.4 (L) 03/06/2021 0356   CL 110 03/06/2021 0356   CO2 17 (L) 03/06/2021 0356   GLUCOSE 67 (L) 03/06/2021 0356   BUN 7 (L) 03/06/2021 0356   CREATININE 0.63 03/06/2021 0356   CALCIUM 6.8 (L) 03/06/2021 0356   PROT 6.8 03/04/2021 1924   ALBUMIN 3.8 03/04/2021 1924   AST 40 03/04/2021 1924   ALT 24 03/04/2021 1924   ALKPHOS 80 03/04/2021 1924   BILITOT 0.7 03/04/2021 1924   GFRNONAA >60 03/06/2021 0356   GFRAA >60 03/01/2020 0525   Lipid Panel     Component Value Date/Time   CHOL 175 03/05/2021 0400   TRIG 68 03/05/2021 0400   HDL 60 03/05/2021 0400   CHOLHDL 2.9 03/05/2021 0400   VLDL 14 03/05/2021 0400   LDLCALC 101 (H) 03/05/2021 0400   Lab Results  Component Value Date   HGBA1C 5.5 03/05/2021   Urinalysis    Component Value Date/Time   COLORURINE YELLOW 03/06/2021 0842   APPEARANCEUR CLEAR 03/06/2021 0842   LABSPEC 1.015 03/06/2021 0842   PHURINE 6.0 03/06/2021 0842   GLUCOSEU NEGATIVE 03/06/2021 0842   HGBUR SMALL (A) 03/06/2021 0842   BILIRUBINUR NEGATIVE 03/06/2021 0842   KETONESUR 80 (A) 03/06/2021 0842   PROTEINUR NEGATIVE 03/06/2021 3614  UROBILINOGEN 0.2 12/26/2019 1226   NITRITE NEGATIVE 03/06/2021 0842   LEUKOCYTESUR MODERATE (A) 03/06/2021 0842   Imaging Reviewed: EEG overnight: "This study showed showed one seizure on 03/05/2021 at 1726 arising from left centro-temporal region during which patient had trouble speaking, lasted for about 1.5 minutes."  EEG 03/05/2021: "This study showed showed one seizure on 03/05/2021 at 1542 arising from left centro-temporal region during which patient was unable to speak, lasted for about 6 minutes."  CT-scan of the  brain Interval endovascular repair of known right PICA aneurysm. The aneurysm sac appears enlarged versus remote prior examination, though intervening examines are not available to determine interval change prior to endovascular therapy. The aneurysm sac demonstrates interval increase in size since prior examination measuring 21 mm in greatest dimension. Patency of the aneurysm sac is not well assessed on this examination and would be better assessed with MRI or formal arteriography.   Stable mild ventriculomegaly disproportionate to the degree of cerebral atrophy. Clinical correlation for signs and symptoms of normal pressure hydrocephalus may be helpful.   No acute intracranial hemorrhage or infarct.   Stable mild to moderate senescent change.   CT angio head and neck: 1. Negative CTA for large vessel occlusion. 2. Sequelae of prior endovascular treatment of right PICA aneurysm with pipeline stent in place within right V4 segment, stable in position. Residual aneurysm filling measuring 6 x 7 x 6 mm, little interval changed, previously measuring 6 x 6 x 6 mm on CTA from 10/03/2018. 3. Otherwise stable and negative CTA of the head and neck. No other hemodynamically significant or correctable stenosis. 4. 7 mm right lower lobe nodule, partially visualized. Further evaluation with dedicated cross-sectional imaging of the chest suggested for further evaluation.   MRI examination of the brain 1. 12 mm linear focus of diffusion abnormality involving the right cerebellum, suggesting a small subacute ischemic infarct. This is new as compared to previous MRI from 11/17/2019. 2. No other acute intracranial abnormality. 3. Partially thrombosed right PICA aneurysm, relatively stable in size measuring up to 17 mm with residual 7 mm internal flow void. 4. Diffuse ventricular prominence somewhat out of proportion to cortical sulcation, stable. While this finding may be related to underlying atrophy, a  component of normal pressure hydrocephalus could also be considered in the correct clinical setting. 5. Underlying moderate chronic microvascular ischemic disease. 6. Prior right suboccipital craniectomy.   Echocardiogram 03/05/2021: "1. Left ventricular ejection fraction, by estimation, is 60 to 65%. The left ventricle has normal function. The left ventricle has no regional wall motion abnormalities. There is mild left ventricular hypertrophy. Left ventricular diastolic parameters are consistent with Grade I diastolic dysfunction (impaired relaxation).   2. Right ventricular systolic function is normal. The right ventricular size is normal.   3. The mitral valve is normal in structure. Mild mitral valve regurgitation. No evidence of mitral stenosis.   4. The aortic valve is normal in structure. Aortic valve regurgitation is not visualized. No aortic stenosis is present.   5. The inferior vena cava is normal in size with greater than 50% respiratory variability, suggesting right atrial pressure of 3 mmHg.  Comparison(s): No significant change from prior study. Prior images reviewed side by side.  Conclusion(s)/Recommendation(s): No intracardiac source of embolism detected on this transthoracic study. A transesophageal echocardiogram is recommended to exclude cardiac source of embolism if clinically indicated."  Assessment: 75 y.o. female with PMHx of frequent falls, right PICA aneurysm s/p stent placement, GERD, depression, and anxiety who presented to  [facility] after a fall on 03/04/2021 and acute, intermittent episodes of aphasia. - Initial work up revealed a small right cerebellar subacute infarct and a partially thrombosed right PICA aneurysm. EEG was obtained with concern for new onset seizure activity during aphasic episodes. UA leukocyte positive with few bacteria. Urine culture pending - Examination on [place] arrival revealed patient with increasing frequency of aphasic episodes. LTM EEG was  initiated and patient was given 1 mg of Ativan and loaded with 1 gram of Keppra with 500 mg BID maintenance dosing. One seizure captured on routine EEG 1542 lasting approximately 6 minutes, one other seizure captured overnight at 1726 lasting approximately 1.5 minutes both arising from centro-temporal region and associated with speech arrest. Ativan given at 1616 with Keppra load at 1747.  - MRI with evidence of a small subacute right cerebellar infarct and partially thrombosed right PICA aneurysm. - Patient with evidence of EEG changes during aphasic episodes consistent with new onset seizures. Recent fall possibly secondary to subacute small right cerebellar stroke.    Impression: New onset focal seizures Subacute small right cerebellar infarct   Recommendations: # New onset seizure activity - Discontinue LTM EEG - MRI brain with contrast: T1 and contrast imaging only  - Inpatient seizure precautions - Continue Keppra 500 mg BID PO or IV  - 1 mg Ativan for any seizure lasting > 5 minutes - UA leukocyte positive with few bacteria, urine culture pending - Treatment of toxic metabolic derangements per primary team   # Small subacute right cerebellar infarct # Partially thrombosed right PICA aneurysm - Statin therapy for LDL above goal of 70 - Frequent neuro checks - Prophylactic therapy- Antiplatelet med: Aspirin - 81 mg PO daily - Risk factor modification - Telemetry monitoring - PT consult, OT consult, Speech consult  Anibal Henderson, AGACNP-BC [facility] [phone]

## 2021-03-06 NOTE — Care Management Obs Status (Signed)
[facility] NOTIFICATION   Patient Details  Name: Elaine Rivera MRN: [id number] Date of Birth: 06-24-1946   Medicare Observation Status Notification Given:  Yes    Pollie Friar, RN 03/06/2021, 2:15 PM

## 2021-03-07 ENCOUNTER — Inpatient Hospital Stay (HOSPITAL_COMMUNITY): Payer: PPO

## 2021-03-07 LAB — URINE CULTURE

## 2021-03-07 MED ORDER — POLYVINYL ALCOHOL 1.4 % OP SOLN
1.0000 [drp] | OPHTHALMIC | Status: DC | PRN
  Administered 2021-03-09: 1 [drp] via OPHTHALMIC
  Filled 2021-03-07: qty 15

## 2021-03-07 MED ORDER — LEVETIRACETAM 500 MG PO TABS
500.0000 mg | ORAL_TABLET | Freq: Two times a day (BID) | ORAL | Status: DC
  Administered 2021-03-07 – 2021-03-10 (×7): 500 mg via ORAL
  Filled 2021-03-07 (×7): qty 1

## 2021-03-07 MED ORDER — GADOBUTROL 1 MMOL/ML IV SOLN
6.0000 mL | Freq: Once | INTRAVENOUS | Status: AC | PRN
  Administered 2021-03-07: 6 mL via INTRAVENOUS

## 2021-03-07 NOTE — Discharge Summary (Addendum)
Physician Discharge Summary  Elaine Rivera YTK:[id number] DOB: 08-01-1946 DOA: 03/04/2021  PCP: Carol Ada, MD  Admit date: 03/04/2021 Discharge date: 03/10/2021  Admitted From: Home Disposition: Home  Recommendations for Outpatient Follow-up:  Follow up with PCP in 1 week Follow up with neurology in 4 weeks Please follow up on the following pending results: None  Home Health: SNF Equipment/Devices: None  Discharge Condition: Stable CODE STATUS: Full code Diet recommendation: Heart healthy   Brief/Interim Summary:  Admission HPI written by Vianne Bulls, MD   HPI: Elaine Rivera is a 75 y.o. 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  Patient reportedly fell and hit her head on 02/28/2021 without losing consciousness at that time but has been experiencing headaches.  She then reports falling off the commode at approximately 6 PM yesterday, felt too weak to get up, but was eventually able to let a friend into her home.  Her friend noted that the patient was having recurrent episodes where she was unable to speak.  EMS was called and the patient was transported to the hospital.  Patient's daughter reported that the patient was started on Remeron approximately 3 weeks ago and has had elevated mood and has been sleeping less since then. In the ED, the patient has difficultly recalling the events from earlier today. She denies any recent fever, chills, chest pain, or palpitations. She remembers experiencing an occipital headache earlier that has since resolved.   Hospital course:  Subacute ischemic CVA MRI significant for evidence of small subacute right cerebellar infarct. Hemoglobin A1C of 5.5%. LDL of 101. Transthoracic Echocardiogram significant  for no evidence of intracardiac embolic source. PT/OT recommending CIR for which patient did not qualify. Discharge to SNF.   Focal seizures Resultant aphasia. Seizures diagnosed via LTM EEG. Patient started on Keppra load followed by Keppra 500 mg BID. Continue Keppra 500 mg BID.   Hypokalemia Improved with supplementation. Slightly low at 3.4 stable.   Lung nodules Incidental finding. Recommendation for dedicated CT chest for further workup which revealed two 7 mm nodules in RLL. Recommendation for non-contrast chest CT at 3-6 months then if stable at that time, future CT at 18-24 months from initial CT is considered optional for low-risk patients   Depression Anxiety Continue Xanax and Zoloft. Patient states she is not taking Remeron so this has been discontinued.  Discharge Diagnoses:  Principal Problem:   Ischemic cerebrovascular accident (CVA) ([place]) Active Problems:   Depression   HTN (hypertension)   Hypokalemia   Anxiety   Transient speech disturbance   Lung nodule   Fall   Focal seizure [facility])   CVA (cerebral vascular accident) [facility])    Discharge Instructions  Discharge Instructions     Ambulatory referral to Neurology   Complete by: As directed    An appointment is requested in approximately: 1 week      Allergies as of 03/10/2021       Reactions   Sulfa Antibiotics Other (See Comments)   Reaction:  Unknown         Medication List     STOP taking these medications    mirtazapine 15 MG tablet Commonly known as: REMERON       TAKE these medications    acetaminophen 325 MG tablet Commonly known as: TYLENOL Take  650 mg by mouth every 6 (six) hours as needed for mild pain, fever or headache.   ALPRAZolam 0.5 MG tablet Commonly known as: XANAX Take 1 tablet (0.5 mg total) by mouth 2 (two) times daily as needed for anxiety.   atorvastatin 40 MG tablet Commonly known as: LIPITOR Take 1 tablet (40 mg total) by mouth daily before supper.    levETIRAcetam 500 MG tablet Commonly known as: KEPPRA Take 1 tablet (500 mg total) by mouth every 12 (twelve) hours.   senna-docusate 8.6-50 MG tablet Commonly known as: Senokot-S Take 1 tablet by mouth at bedtime as needed for mild constipation.   sertraline 50 MG tablet Commonly known as: ZOLOFT Take 50 mg by mouth daily.   temazepam 15 MG capsule Commonly known as: RESTORIL Take 1-2 capsules (15-30 mg total) by mouth at bedtime as needed for sleep.        Follow-up Information     Carol Ada, MD. Schedule an appointment as soon as possible for a visit .   Specialty: Family Medicine Contact information: [phone] [place] [address] [place]. Go to .   Why: As needed, If symptoms worsen Contact information: [place] [phone]        Garvin Fila, MD. Schedule an appointment as soon as possible for a visit in 4 week(s).   Specialties: Neurology, Radiology Why: For hospital follow-up Contact information: [place] [address] [phone]                Allergies  Allergen Reactions   Sulfa Antibiotics Other (See Comments)    Reaction:  Unknown     Consultations: Neurology   Procedures/Studies: CT Angio Head W or Wo Contrast  Result Date: 03/05/2021 CLINICAL DATA:  Initial evaluation for neuro deficit. EXAM: CT ANGIOGRAPHY HEAD AND NECK TECHNIQUE: Multidetector CT imaging of the head and neck was performed using the standard protocol during bolus administration of intravenous contrast. Multiplanar CT image reconstructions and MIPs were obtained to evaluate the vascular anatomy. Carotid stenosis measurements (when applicable) are obtained utilizing NASCET criteria, using the distal internal carotid diameter as the denominator. CONTRAST:  192mL OMNIPAQUE IOHEXOL 350 MG/ML SOLN COMPARISON:  Prior CT from  earlier the same day. FINDINGS: CTA NECK FINDINGS Aortic arch: Visualized arch normal caliber with normal 3 vessel morphology. Mild atheromatous change about the arch itself. No significant stenosis about the origin of the great vessels. Right carotid system: Right common and internal carotid arteries widely patent without stenosis, dissection or occlusion. Left carotid system: Left common and internal carotid arteries widely patent without stenosis, dissection or occlusion. Vertebral arteries: Both vertebral arteries arise from the subclavian arteries. Right vertebral artery dominant. No significant proximal subclavian artery stenosis. Vertebral arteries patent within the neck without stenosis, dissection or occlusion. Skeleton: No visible acute osseous finding. No discrete or worrisome osseous lesions. Severe thoracic scoliosis partially visualized. Other neck: No other acute soft tissue abnormality within the neck. Probable right vocal cord paresis noted. Upper chest: Scattered atelectatic changes noted within the partially visualized left lower lobe. 7 mm nodular density partially visualized within the right lower lobe (series 1, image 1). Linear atelectasis versus scarring present at the right lung apex. Suspected hiatal hernia partially visualized. Review of the MIP images confirms the above findings CTA HEAD FINDINGS Anterior circulation: Petrous, cavernous, and supraclinoid segments widely patent  without stenosis. A1 segments patent bilaterally. Normal anterior communicating artery complex. Anterior cerebral arteries patent to their distal aspects without stenosis. No M1 stenosis or occlusion. Normal MCA bifurcations. Distal MCA branches well perfused and symmetric. Posterior circulation: Pipeline stent in place within the right V4 segment, extending from the dural reflection to the vertebrobasilar junction. Patent flow seen through the stent. 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 Residual aneurysm  filling measures 6 x 7 x 6 mm, previously 6 x 6 x 6 mm on prior CTA from 10/03/2018. Hypoplastic left vertebral artery remains widely patent. Left PICA origin patent and normal. Basilar widely patent. Superior cerebellar arteries patent bilaterally. Both PCA supplied via the basilar as well as bilateral posterior communicating arteries, larger on the right. PCAs remain well perfused to their distal aspects. Venous sinuses: Patent allowing for timing of the contrast bolus. Anatomic variants: Mildly hypoplastic right P1 segment with robust right posterior communicating artery. No new aneurysm. Review of the MIP images confirms the above findings IMPRESSION: 1. Negative CTA for large vessel occlusion. 2. Sequelae of prior endovascular treatment of right PICA aneurysm with pipeline stent in place within right V4 segment, stable in position. Residual aneurysm filling measuring 6 x 7 x 6 mm, little interval changed, previously measuring 6 x 6 x 6 mm on CTA from 10/03/2018. 3. Otherwise stable and negative CTA of the head and neck. No other hemodynamically significant or correctable stenosis. 4. 7 mm right lower lobe nodule, partially visualized. Further evaluation with dedicated cross-sectional imaging of the chest suggested for further evaluation. Electronically Signed   By: Jeannine Boga M.D.   On: 03/05/2021 00:00   CT Head Wo Contrast  Result Date: 03/04/2021 CLINICAL DATA:  Dizziness, fall, head injury EXAM: CT HEAD WITHOUT CONTRAST TECHNIQUE: Contiguous axial images were obtained from the base of the skull through the vertex without intravenous contrast. COMPARISON:  MRI 11/17/2019 FINDINGS: Brain: Right suboccipital craniotomy has been performed. A hyperdense mass is seen within the right cerebellum medullary angle measuring 1.5 x 2.1 cm at axial image # 7/2 compatible with the known PICA aneurysm and demonstrating interval growth since prior MRI examination where this measured roughly 12 mm x 18 mm. A  vascular stent compatible with a pipeline stent is seen within the right V4 segment of the vertebral artery extending to the origin of the basilar artery. No evidence of acute intracranial hemorrhage or infarct. No abnormal mass effect or midline shift. No abnormal intra or extra-axial fluid collection identified. There is mild ventriculomegaly again identified which appears disproportionate to the degree of cerebral atrophy suggesting underlying communicating hydrocephalus. Mild parenchymal volume loss is again noted. Moderate periventricular white matter changes are again seen, possibly related to small vessel ischemic change. Vascular: No asymmetric hyperdense vasculature at the skull base. Skull: There is no acute fracture identified. Sinuses/Orbits: There is partial opacification of the right sphenoid sinus. Mucous retention cyst noted within the left maxillary sinus. Remaining paranasal sinuses are clear. Orbits are unremarkable. Other: Mastoid air cells and middle ear cavities are clear. 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 The aneurysm sac appears enlarged versus remote prior examination, though intervening examines are not available to determine interval change prior to endovascular therapy. The aneurysm sac demonstrates interval increase in size since prior examination measuring 21 mm in greatest dimension. Patency of the aneurysm sac is not well assessed on this examination and would be better assessed with MRI or formal arteriography. Stable mild ventriculomegaly disproportionate to the degree  of cerebral atrophy. Clinical correlation for signs and symptoms of normal pressure hydrocephalus may be helpful. No acute intracranial hemorrhage or infarct. Stable mild to moderate senescent change. Mild paranasal sinus disease. Electronically Signed   By: Fidela Salisbury MD   On: 03/04/2021 20:31   CT Angio Neck W and/or Wo Contrast  Result Date: 03/05/2021 CLINICAL DATA:   Initial evaluation for neuro deficit. EXAM: CT ANGIOGRAPHY HEAD AND NECK TECHNIQUE: Multidetector CT imaging of the head and neck was performed using the standard protocol during bolus administration of intravenous contrast. Multiplanar CT image reconstructions and MIPs were obtained to evaluate the vascular anatomy. Carotid stenosis measurements (when applicable) are obtained utilizing NASCET criteria, using the distal internal carotid diameter as the denominator. CONTRAST:  129mL OMNIPAQUE IOHEXOL 350 MG/ML SOLN COMPARISON:  Prior CT from earlier the same day. FINDINGS: CTA NECK FINDINGS Aortic arch: Visualized arch normal caliber with normal 3 vessel morphology. Mild atheromatous change about the arch itself. No significant stenosis about the origin of the great vessels. Right carotid system: Right common and internal carotid arteries widely patent without stenosis, dissection or occlusion. Left carotid system: Left common and internal carotid arteries widely patent without stenosis, dissection or occlusion. Vertebral arteries: Both vertebral arteries arise from the subclavian arteries. Right vertebral artery dominant. No significant proximal subclavian artery stenosis. Vertebral arteries patent within the neck without stenosis, dissection or occlusion. Skeleton: No visible acute osseous finding. No discrete or worrisome osseous lesions. Severe thoracic scoliosis partially visualized. Other neck: No other acute soft tissue abnormality within the neck. Probable right vocal cord paresis noted. Upper chest: Scattered atelectatic changes noted within the partially visualized left lower lobe. 7 mm nodular density partially visualized within the right lower lobe (series 1, image 1). Linear atelectasis versus scarring present at the right lung apex. Suspected hiatal hernia partially visualized. Review of the MIP images confirms the above findings CTA HEAD FINDINGS Anterior circulation: Petrous, cavernous, and  supraclinoid segments widely patent without stenosis. A1 segments patent bilaterally. Normal anterior communicating artery complex. Anterior cerebral arteries patent to their distal aspects without stenosis. No M1 stenosis or occlusion. Normal MCA bifurcations. Distal MCA branches well perfused and symmetric. Posterior circulation: Pipeline stent in place within the right V4 segment, extending from the dural reflection to the vertebrobasilar junction. Patent flow seen through the stent. 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 Residual aneurysm filling measures 6 x 7 x 6 mm, previously 6 x 6 x 6 mm on prior CTA from 10/03/2018. Hypoplastic left vertebral artery remains widely patent. Left PICA origin patent and normal. Basilar widely patent. Superior cerebellar arteries patent bilaterally. Both PCA supplied via the basilar as well as bilateral posterior communicating arteries, larger on the right. PCAs remain well perfused to their distal aspects. Venous sinuses: Patent allowing for timing of the contrast bolus. Anatomic variants: Mildly hypoplastic right P1 segment with robust right posterior communicating artery. No new aneurysm. Review of the MIP images confirms the above findings IMPRESSION: 1. Negative CTA for large vessel occlusion. 2. Sequelae of prior endovascular treatment of right PICA aneurysm with pipeline stent in place within right V4 segment, stable in position. Residual aneurysm filling measuring 6 x 7 x 6 mm, little interval changed, previously measuring 6 x 6 x 6 mm on CTA from 10/03/2018. 3. Otherwise stable and negative CTA of the head and neck. No other hemodynamically significant or correctable stenosis. 4. 7 mm right lower lobe nodule, partially visualized. Further evaluation with dedicated cross-sectional imaging of the chest  suggested for further evaluation. Electronically Signed   By: Jeannine Boga M.D.   On: 03/05/2021 00:00   CT CHEST WO CONTRAST  Result Date:  03/06/2021 CLINICAL DATA:  Lung nodule EXAM: CT CHEST WITHOUT CONTRAST TECHNIQUE: Multidetector CT imaging of the chest was performed following the standard protocol without IV contrast. Sagittal and coronal MPR images reconstructed from axial data set. COMPARISON:  CT angio head/neck 03/04/2021 FINDINGS: Cardiovascular: Atherosclerotic calcifications aorta and proximal great vessels. Tortuous thoracic aorta related to scoliosis. Aorta normal caliber. Heart unremarkable. No pericardial effusion. Mediastinum/Nodes: Large hiatal hernia. Esophagus unremarkable. Base of cervical region normal appearance. No thoracic adenopathy. Lungs/Pleura: Mild RIGHT apical scarring. 7 mm RIGHT lower lobe nodule image 75. Additional 7 mm subpleural nodule RIGHT lower lobe laterally image 83. Bibasilar atelectasis. Scattered infiltrate RIGHT lower lobe. Subsegmental atelectasis at medial aspect base of RIGHT middle lobe. No pleural effusion or pneumothorax. Upper Abdomen: Nonobstructing BILATERAL renal calculi. Remaining visualized upper abdomen unremarkable. Musculoskeletal: Significant thoracolumbar scoliosis and osseous demineralization. IMPRESSION: Large hiatal hernia. Bibasilar atelectasis with scattered infiltrate in RIGHT lower lobe. Nonobstructing BILATERAL renal calculi. Two 7 mm nodules in RIGHT lower lobe, nonspecific; non-contrast chest CT at 3-6 months is recommended. If the nodules are stable at time of repeat CT, then future CT at 18-24 months (from today's scan) is considered optional for low-risk patients, but is recommended for high-risk patients. This recommendation follows the consensus statement: Guidelines for Management of Incidental Pulmonary Nodules Detected on CT Images: From the Fleischner Society 2017; Radiology 2017; 284:228-243. Aortic Atherosclerosis (ICD10-I70.0). Electronically Signed   By: Lavonia Dana M.D.   On: 03/06/2021 16:27   MR BRAIN WO CONTRAST  Result Date: 03/05/2021 CLINICAL DATA:  Initial  evaluation for acute TIA. EXAM: MRI HEAD WITHOUT CONTRAST TECHNIQUE: Multiplanar, multiecho pulse sequences of the brain and surrounding structures were obtained without intravenous contrast. COMPARISON:  Prior CTs from 03/04/2021 and MRI from 11/17/2019. FINDINGS: Brain: Diffuse prominence of the CSF containing spaces compatible with generalized cerebral atrophy. Patchy and confluent T2/FLAIR hyperintensity within the periventricular and deep white matter of both cerebral hemispheres as well as the pons, most consistent with chronic microvascular ischemic disease, moderate in nature. There is a subtle 12 mm linear focus of diffusion abnormality involving the right cerebellum (series 5, image 56) associated T2/FLAIR signal abnormality without ADC correlate, suggesting a small subacute ischemic infarct. This is new as compared to previous MRI. No other diffusion abnormality to suggest acute or subacute ischemia. Gray-white matter differentiation otherwise maintained. No other areas of encephalomalacia to suggest chronic cortical infarction. No findings to suggest acute intracranial hemorrhage. No mass lesion, mass effect, or midline shift. Diffuse ventricular prominence somewhat out of proportion to cortical sulcation, similar to previous. No extra-axial fluid collection. Pituitary gland suprasellar region normal. Midline structures intact. 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 The aneurysm sac is little interval changed in overall size measuring 17 x 12 x 12 mm (previously 17 x 11 x 11 mm). 7 mm residual flow void within the aneurysm sac better appreciated on prior CTA. Patent flow void through the right the for pipeline stent. Hypoplastic left vertebral artery noted. Major intracranial vascular flow voids otherwise well maintained. Skull and upper cervical spine: Craniocervical junction within normal limits. Bone marrow signal intensity mildly heterogeneous but within normal limits. Prior  right suboccipital craniectomy noted. No scalp soft tissue abnormality. Sinuses/Orbits: Globes and orbital soft tissues demonstrate no acute finding. Left maxillary sinus retention cyst. Scattered mucosal thickening noted within  the sphenoid ethmoidal sinuses. No mastoid effusion. Inner ear structures grossly normal. Other: None. IMPRESSION: 1. 12 mm linear focus of diffusion abnormality involving the right cerebellum, suggesting a small subacute ischemic infarct. This is new as compared to previous MRI from 11/17/2019. 2. No other acute intracranial abnormality. 3. Partially thrombosed right PICA aneurysm, relatively stable in size measuring up to 17 mm with residual 7 mm internal flow void. 4. Diffuse ventricular prominence somewhat out of proportion to cortical sulcation, stable. While this finding may be related to underlying atrophy, a component of normal pressure hydrocephalus could also be considered in the correct clinical setting. 5. Underlying moderate chronic microvascular ischemic disease. 6. Prior right suboccipital craniectomy. Electronically Signed   By: Jeannine Boga M.D.   On: 03/05/2021 01:06   MR BRAIN W CONTRAST  Result Date: 03/07/2021 CLINICAL DATA:  Seizure, abnormal neuro exam EXAM: MRI HEAD WITH CONTRAST TECHNIQUE: Multiplanar, multiecho pulse sequences of the brain and surrounding structures were obtained with intravenous contrast. CONTRAST:  72mL GADAVIST GADOBUTROL 1 MMOL/ML IV SOLN COMPARISON:  None. FINDINGS: Brain: There is no abnormal enhancement. Stable prominence of the ventricles disproportionate to the sulci. Vascular: Partially thrombosed treated right PICA aneurysm is again noted. Skull and upper cervical spine: Unremarkable. Sinuses/Orbits: No new finding. Other: None. IMPRESSION: No abnormal parenchymal or leptomeningeal enhancement. Partially thrombosed treated right PICA aneurysm. Electronically Signed   By: Macy Mis M.D.   On: 03/07/2021 13:11   EEG  adult  Result Date: 03/05/2021 Lora Havens, MD     03/05/2021  5:33 PM Patient Name: COLENA KETTERMAN MRN: [id number] Epilepsy Attending: Lora Havens Referring Physician/Provider: Dr Mitzi Hansen Date: 03/05/2021 Duration: 24.09 mins Patient history: 75yo F with transient aphasia. EEG to evaluate for seizure Level of alertness: Awake AEDs during EEG study: None Technical aspects: This EEG study was done with scalp electrodes positioned according to the 10-20 International system of electrode placement. Electrical activity was acquired at a sampling rate of 500Hz  and reviewed with a high frequency filter of 70Hz  and a low frequency filter of 1Hz . EEG data were recorded continuously and digitally stored. Description: The posterior dominant rhythm consists of 9 Hz activity of moderate voltage (25-35 uV) seen predominantly in posterior head regions, symmetric and reactive to eye opening and eye closing. Event button was pressed at 1543 for speech disturbance,unable to answer questions. Concomitant EEG showed rhythmic sharply contoured 7-8hz  alpha activity arising from left centro-temporal region  which evolved into 5-6hz  theta slowing  which gradually involved all of left hemisphere consistent with focal seizure. The seizure started at 1542 and lasted about 6 minutes. Hyperventilation and photic stimulation were not performed.   ABNORMALITY - Focal seizure, left centro-temporal region IMPRESSION: This study showed showed one seizure on 03/05/2021 at 1542 arising from left centro-temporal region during which patient was unable to speak, lasted for about 6 minutes. Dr Myna Hidalgo was notified. Priyanka Barbra Sarks   Overnight EEG with video  Result Date: 03/06/2021 Lora Havens, MD     03/06/2021  1:19 PM Patient Name: AVIGAYIL TON MRN: [id number] Epilepsy Attending: Lora Havens Referring Physician/Provider: Dr Mitzi Hansen Duration: 03/05/2021 1550 to 03/06/2021 1237  Patient history: 75yo F with transient aphasia.  EEG to evaluate for seizure  Level of alertness: Awake, asleep  AEDs during EEG study: LEV  Technical aspects: This EEG study was done with scalp electrodes positioned according to the 10-20 International system of electrode placement. Electrical activity was acquired at a  sampling rate of 500Hz  and reviewed with a high frequency filter of 70Hz  and a low frequency filter of 1Hz . EEG data were recorded continuously and digitally stored.  Description: The posterior dominant rhythm consists of 9 Hz activity of moderate voltage (25-35 uV) seen predominantly in posterior head regions, symmetric and reactive to eye opening and eye closing.  Sleep was characterized by vertex waves, sleep spindles (12 to 14 Hz), maximal frontocentral region.  One seizure was recorded on 03/05/2021 at 1726 during which patient was sitting in bed, trying to speak but having some trouble.  Concomitant EEG showed rhythmic sharply contoured 7-8hz  alpha activity arising from left centro-temporal region  which evolved into 5-6hz  theta slowing and gradually involved all of left hemisphere The seizure lasted about 1.5 minutes. Hyperventilation and photic stimulation were not performed.    ABNORMALITY - Focal seizure, left centro-temporal region  IMPRESSION: This study showed showed one seizure on 03/05/2021 at 1726 arising from left centro-temporal region during which patient had trouble speaking, lasted for about 1.5 minutes.  Lora Havens   ECHOCARDIOGRAM COMPLETE  Result Date: 03/05/2021    ECHOCARDIOGRAM REPORT   Patient Name:   MAELI SPACEK Date of Exam: 03/05/2021 Medical Rec #:  [id number]         Height:       59.0 in Accession #:    [id number]        Weight:       130.0 lb Date of Birth:  05-Jan-1946         BSA:          1.536 m Patient Age:    75 years          BP:           132/72 mmHg Patient Gender: F                 HR:           64 bpm. Exam Location:  Inpatient Procedure: 2D Echo, Cardiac Doppler and Color Doppler Indications:     Stroke  History:        Patient has prior history of Echocardiogram examinations, most                 recent 02/29/2020. Risk Factors:Hypertension.  Sonographer:    Cammy Brochure Referring Phys: [id number] [place]  1. Left ventricular ejection fraction, by estimation, is 60 to 65%. The left ventricle has normal function. The left ventricle has no regional wall motion abnormalities. There is mild left ventricular hypertrophy. Left ventricular diastolic parameters are consistent with Grade I diastolic dysfunction (impaired relaxation).  2. Right ventricular systolic function is normal. The right ventricular size is normal.  3. The mitral valve is normal in structure. Mild mitral valve regurgitation. No evidence of mitral stenosis.  4. The aortic valve is normal in structure. Aortic valve regurgitation is not visualized. No aortic stenosis is present.  5. The inferior vena cava is normal in size with greater than 50% respiratory variability, suggesting right atrial pressure of 3 mmHg. Comparison(s): No significant change from prior study. Prior images reviewed side by side. Conclusion(s)/Recommendation(s): No intracardiac source of embolism detected on this transthoracic study. A transesophageal echocardiogram is recommended to exclude cardiac source of embolism if clinically indicated. FINDINGS  Left Ventricle: Left ventricular ejection fraction, by estimation, is 60 to 65%. The left ventricle has normal function. The left ventricle has no regional wall motion abnormalities. The left ventricular internal  cavity size was normal in size. There is  mild left ventricular hypertrophy. Left ventricular diastolic parameters are consistent with Grade I diastolic dysfunction (impaired relaxation). Right Ventricle: The right ventricular size is normal. No increase in right ventricular wall thickness. Right ventricular systolic function is normal. Left Atrium: Left atrial size was normal in size. Right  Atrium: Right atrial size was normal in size. Pericardium: There is no evidence of pericardial effusion. Mitral Valve: The mitral valve is normal in structure. Mild mitral valve regurgitation. No evidence of mitral valve stenosis. MV peak gradient, 9.2 mmHg. The mean mitral valve gradient is 3.0 mmHg. Tricuspid Valve: The tricuspid valve is normal in structure. Tricuspid valve regurgitation is not demonstrated. No evidence of tricuspid stenosis. Aortic Valve: The aortic valve is normal in structure. Aortic valve regurgitation is not visualized. No aortic stenosis is present. Aortic valve mean gradient measures 4.0 mmHg. Aortic valve peak gradient measures 7.1 mmHg. Aortic valve area, by VTI measures 2.41 cm. Pulmonic Valve: The pulmonic valve was normal in structure. Pulmonic valve regurgitation is not visualized. No evidence of pulmonic stenosis. Aorta: The aortic root is normal in size and structure. Venous: The inferior vena cava is normal in size with greater than 50% respiratory variability, suggesting right atrial pressure of 3 mmHg. IAS/Shunts: No atrial level shunt detected by color flow Doppler.  LEFT VENTRICLE PLAX 2D LVIDd:         3.60 cm  Diastology LVIDs:         2.50 cm  LV e' medial:    5.98 cm/s LV PW:         1.10 cm  LV E/e' medial:  16.1 LV IVS:        1.30 cm  LV e' lateral:   9.46 cm/s LVOT diam:     1.90 cm  LV E/e' lateral: 10.1 LV SV:         67 LV SV Index:   43 LVOT Area:     2.84 cm  RIGHT VENTRICLE RV Basal diam:  2.70 cm RV S prime:     14.40 cm/s TAPSE (M-mode): 2.2 cm LEFT ATRIUM             Index       RIGHT ATRIUM          Index LA diam:        3.30 cm 2.15 cm/m  RA Area:     9.18 cm LA Vol (A2C):   36.0 ml 23.44 ml/m RA Volume:   16.20 ml 10.55 ml/m LA Vol (A4C):   38.3 ml 24.94 ml/m LA Biplane Vol: 37.3 ml 24.29 ml/m  AORTIC VALVE AV Area (Vmax):    2.26 cm AV Area (Vmean):   2.19 cm AV Area (VTI):     2.41 cm AV Vmax:           133.00 cm/s AV Vmean:          88.400  cm/s AV VTI:            0.276 m AV Peak Grad:      7.1 mmHg AV Mean Grad:      4.0 mmHg LVOT Vmax:         106.00 cm/s LVOT Vmean:        68.400 cm/s LVOT VTI:          0.235 m LVOT/AV VTI ratio: 0.85  AORTA Ao Root diam: 2.80 cm Ao Asc diam:  3.80 cm MITRAL VALVE  TRICUSPID VALVE MV Area (PHT): 3.48 cm     TR Peak grad:   26.8 mmHg MV Area VTI:   1.77 cm     TR Vmax:        259.00 cm/s MV Peak grad:  9.2 mmHg MV Mean grad:  3.0 mmHg     SHUNTS MV Vmax:       1.52 m/s     Systemic VTI:  0.24 m MV Vmean:      75.3 cm/s    Systemic Diam: 1.90 cm MV Decel Time: 218 msec MV E velocity: 96.00 cm/s MV A velocity: 138.00 cm/s MV E/A ratio:  0.70 Candee Furbish MD Electronically signed by Candee Furbish MD Signature Date/Time: 03/05/2021/2:22:34 PM    Final       Subjective: No issues overnight. No seizures noted.  Discharge Exam: Vitals:   03/10/21 0010 03/10/21 0341  BP: 121/64 (!) 115/55  Pulse: (!) 59 (!) 59  Resp:  18  Temp: 97.8 F (36.6 C) (!) 97.5 F (36.4 C)  SpO2: 96% 96%   Vitals:   03/09/21 1617 03/09/21 1959 03/10/21 0010 03/10/21 0341  BP: 118/69 115/60 121/64 (!) 115/55  Pulse: 66 69 (!) 59 (!) 59  Resp: 20 18  18   Temp: 98.4 F (36.9 C) 98.4 F (36.9 C) 97.8 F (36.6 C) (!) 97.5 F (36.4 C)  TempSrc: Oral Oral Axillary Oral  SpO2: 97% 96% 96% 96%  Weight:      Height:        General: Pt is alert, awake, not in acute distress Respiratory: No distress    The results of significant diagnostics from this hospitalization (including imaging, microbiology, ancillary and laboratory) are listed below for reference.     Microbiology: Recent Results (from the past 240 hour(s))  Resp Panel by RT-PCR (Flu A&B, Covid) Nasopharyngeal Swab     Status: None   Collection Time: 03/05/21  2:13 AM   Specimen: Nasopharyngeal Swab; Nasopharyngeal(NP) swabs in vial transport medium  Result Value Ref Range Status   SARS Coronavirus 2 by RT PCR NEGATIVE NEGATIVE Final     Comment: (NOTE) SARS-CoV-2 target nucleic acids are NOT DETECTED.  The SARS-CoV-2 RNA is generally detectable in upper respiratory specimens during the acute phase of infection. The lowest concentration of SARS-CoV-2 viral copies this assay can detect is 138 copies/mL. A negative result does not preclude SARS-Cov-2 infection and should not be used as the sole basis for treatment or other patient management decisions. A negative result may occur with  improper specimen collection/handling, submission of specimen other than nasopharyngeal swab, presence of viral mutation(s) within the areas targeted by this assay, and inadequate number of viral copies(<138 copies/mL). A negative result must be combined with clinical observations, patient history, and epidemiological information. The expected result is Negative.  Fact Sheet for Patients:  EntrepreneurPulse.com.au  Fact Sheet for Healthcare Providers:  IncredibleEmployment.be  This test is no t yet approved or cleared by the [place] FDA and  has been authorized for detection and/or diagnosis of SARS-CoV-2 by FDA under an Emergency Use Authorization (EUA). This EUA will remain  in effect (meaning this test can be used) for the duration of the COVID-19 declaration under Section 564(b)(1) of the Act, 21 U.S.C.section 360bbb-3(b)(1), unless the authorization is terminated  or revoked sooner.       Influenza A by PCR NEGATIVE NEGATIVE Final   Influenza B by PCR NEGATIVE NEGATIVE Final    Comment: (NOTE) The Xpert Xpress SARS-CoV-2/FLU/RSV  plus assay is intended as an aid in the diagnosis of influenza from Nasopharyngeal swab specimens and should not be used as a sole basis for treatment. Nasal washings and aspirates are unacceptable for Xpert Xpress SARS-CoV-2/FLU/RSV testing.  Fact Sheet for Patients: EntrepreneurPulse.com.au  Fact Sheet for Healthcare  Providers: IncredibleEmployment.be  This test is not yet approved or cleared by the [place] FDA and has been authorized for detection and/or diagnosis of SARS-CoV-2 by FDA under an Emergency Use Authorization (EUA). This EUA will remain in effect (meaning this test can be used) for the duration of the COVID-19 declaration under Section 564(b)(1) of the Act, 21 U.S.C. section 360bbb-3(b)(1), unless the authorization is terminated or revoked.  Performed at [facility], [place] [address] [place], [place] [address]   Culture, Urine     Status: Abnormal   Collection Time: 03/06/21  8:42 AM   Specimen: Urine, Random  Result Value Ref Range Status   Specimen Description URINE, RANDOM  Final   Special Requests   Final    NONE Performed at [facility], [place] [address] [place], [place] [address]    Culture MULTIPLE SPECIES PRESENT, SUGGEST RECOLLECTION (A)  Final   Report Status 03/07/2021 FINAL  Final  SARS CORONAVIRUS 2 (TAT 6-24 HRS) Nasopharyngeal Nasopharyngeal Swab     Status: None   Collection Time: 03/09/21 10:12 AM   Specimen: Nasopharyngeal Swab  Result Value Ref Range Status   SARS Coronavirus 2 NEGATIVE NEGATIVE Final    Comment: (NOTE) SARS-CoV-2 target nucleic acids are NOT DETECTED.  The SARS-CoV-2 RNA is generally detectable in upper and lower respiratory specimens during the acute phase of infection. Negative results do not preclude SARS-CoV-2 infection, do not rule out co-infections with other pathogens, and should not be used as the sole basis for treatment or other patient management decisions. Negative results must be combined with clinical observations, patient history, and epidemiological information. The expected result is Negative.  Fact Sheet for Patients: SugarRoll.be  Fact Sheet for Healthcare Providers: https://www.woods-mathews.com/  This test is not yet  approved or cleared by the [place] FDA and  has been authorized for detection and/or diagnosis of SARS-CoV-2 by FDA under an Emergency Use Authorization (EUA). This EUA will remain  in effect (meaning this test can be used) for the duration of the COVID-19 declaration under Se ction 564(b)(1) of the Act, 21 U.S.C. section 360bbb-3(b)(1), unless the authorization is terminated or revoked sooner.  Performed at [facility], [place] [address] [place], [place] [address]      Labs: BNP (last 3 results) No results for input(s): BNP in the last 8760 hours. Basic Metabolic Panel: Recent Labs  Lab 03/04/21 1924 03/05/21 0400 03/06/21 0356  NA 138 139 139  K 2.8* 3.0* 3.4*  CL 106 108 110  CO2 25 23 17*  GLUCOSE 112* 94 67*  BUN 10 6* 7*  CREATININE 0.65 0.54 0.63  CALCIUM 8.3* 7.3* 6.8*  MG 2.2  --   --    Liver Function Tests: Recent Labs  Lab 03/04/21 1924  AST 40  ALT 24  ALKPHOS 80  BILITOT 0.7  PROT 6.8  ALBUMIN 3.8   No results for input(s): LIPASE, AMYLASE in the last 168 hours. No results for input(s): AMMONIA in the last 168 hours. CBC: Recent Labs  Lab 03/04/21 1924 03/06/21 0356  WBC 8.9 7.3  HGB 11.5* 11.2*  HCT 36.5 37.0  MCV 79.5* 81.5  PLT 305 274  Cardiac Enzymes: No results for input(s): CKTOTAL, CKMB, CKMBINDEX, TROPONINI in the last 168 hours. BNP: Invalid input(s): POCBNP CBG: No results for input(s): GLUCAP in the last 168 hours. D-Dimer No results for input(s): DDIMER in the last 72 hours. Hgb A1c No results for input(s): HGBA1C in the last 72 hours.  Lipid Profile No results for input(s): CHOL, HDL, LDLCALC, TRIG, CHOLHDL, LDLDIRECT in the last 72 hours.  Thyroid function studies No results for input(s): TSH, T4TOTAL, T3FREE, THYROIDAB in the last 72 hours.  Invalid input(s): FREET3 Anemia work up No results for input(s): VITAMINB12, FOLATE, FERRITIN, TIBC, IRON, RETICCTPCT in the last 72 hours. Urinalysis     Component Value Date/Time   COLORURINE YELLOW 03/06/2021 0842   APPEARANCEUR CLEAR 03/06/2021 0842   LABSPEC 1.015 03/06/2021 0842   PHURINE 6.0 03/06/2021 0842   GLUCOSEU NEGATIVE 03/06/2021 0842   HGBUR SMALL (A) 03/06/2021 0842   BILIRUBINUR NEGATIVE 03/06/2021 0842   KETONESUR 80 (A) 03/06/2021 0842   PROTEINUR NEGATIVE 03/06/2021 0842   UROBILINOGEN 0.2 12/26/2019 1226   NITRITE NEGATIVE 03/06/2021 0842   LEUKOCYTESUR MODERATE (A) 03/06/2021 0842   Sepsis Labs Invalid input(s): PROCALCITONIN,  WBC,  LACTICIDVEN Microbiology Recent Results (from the past 240 hour(s))  Resp Panel by RT-PCR (Flu A&B, Covid) Nasopharyngeal Swab     Status: None   Collection Time: 03/05/21  2:13 AM   Specimen: Nasopharyngeal Swab; Nasopharyngeal(NP) swabs in vial transport medium  Result Value Ref Range Status   SARS Coronavirus 2 by RT PCR NEGATIVE NEGATIVE Final    Comment: (NOTE) SARS-CoV-2 target nucleic acids are NOT DETECTED.  The SARS-CoV-2 RNA is generally detectable in upper respiratory specimens during the acute phase of infection. The lowest concentration of SARS-CoV-2 viral copies this assay can detect is 138 copies/mL. A negative result does not preclude SARS-Cov-2 infection and should not be used as the sole basis for treatment or other patient management decisions. A negative result may occur with  improper specimen collection/handling, submission of specimen other than nasopharyngeal swab, presence of viral mutation(s) within the areas targeted by this assay, and inadequate number of viral copies(<138 copies/mL). A negative result must be combined with clinical observations, patient history, and epidemiological information. The expected result is Negative.  Fact Sheet for Patients:  EntrepreneurPulse.com.au  Fact Sheet for Healthcare Providers:  IncredibleEmployment.be  This test is no t yet approved or cleared by the [place] FDA  and  has been authorized for detection and/or diagnosis of SARS-CoV-2 by FDA under an Emergency Use Authorization (EUA). This EUA will remain  in effect (meaning this test can be used) for the duration of the COVID-19 declaration under Section 564(b)(1) of the Act, 21 U.S.C.section 360bbb-3(b)(1), unless the authorization is terminated  or revoked sooner.       Influenza A by PCR NEGATIVE NEGATIVE Final   Influenza B by PCR NEGATIVE NEGATIVE Final    Comment: (NOTE) The Xpert Xpress SARS-CoV-2/FLU/RSV plus assay is intended as an aid in the diagnosis of influenza from Nasopharyngeal swab specimens and should not be used as a sole basis for treatment. Nasal washings and aspirates are unacceptable for Xpert Xpress SARS-CoV-2/FLU/RSV testing.  Fact Sheet for Patients: EntrepreneurPulse.com.au  Fact Sheet for Healthcare Providers: IncredibleEmployment.be  This test is not yet approved or cleared by the [place] FDA and has been authorized for detection and/or diagnosis of SARS-CoV-2 by FDA under an Emergency Use Authorization (EUA). This EUA will remain in effect (meaning this test can be used) for  the duration of the COVID-19 declaration under Section 564(b)(1) of the Act, 21 U.S.C. section 360bbb-3(b)(1), unless the authorization is terminated or revoked.  Performed at [facility], [place] [address], [place], [place] [address]   Culture, Urine     Status: Abnormal   Collection Time: 03/06/21  8:42 AM   Specimen: Urine, Random  Result Value Ref Range Status   Specimen Description URINE, RANDOM  Final   Special Requests   Final    NONE Performed at [facility], [place] [address] [place], [place] [address]    Culture MULTIPLE SPECIES PRESENT, SUGGEST RECOLLECTION (A)  Final   Report Status 03/07/2021 FINAL  Final  SARS CORONAVIRUS 2 (TAT 6-24 HRS) Nasopharyngeal Nasopharyngeal Swab     Status: None   Collection  Time: 03/09/21 10:12 AM   Specimen: Nasopharyngeal Swab  Result Value Ref Range Status   SARS Coronavirus 2 NEGATIVE NEGATIVE Final    Comment: (NOTE) SARS-CoV-2 target nucleic acids are NOT DETECTED.  The SARS-CoV-2 RNA is generally detectable in upper and lower respiratory specimens during the acute phase of infection. Negative results do not preclude SARS-CoV-2 infection, do not rule out co-infections with other pathogens, and should not be used as the sole basis for treatment or other patient management decisions. Negative results must be combined with clinical observations, patient history, and epidemiological information. The expected result is Negative.  Fact Sheet for Patients: SugarRoll.be  Fact Sheet for Healthcare Providers: https://www.woods-mathews.com/  This test is not yet approved or cleared by the [place] FDA and  has been authorized for detection and/or diagnosis of SARS-CoV-2 by FDA under an Emergency Use Authorization (EUA). This EUA will remain  in effect (meaning this test can be used) for the duration of the COVID-19 declaration under Se ction 564(b)(1) of the Act, 21 U.S.C. section 360bbb-3(b)(1), unless the authorization is terminated or revoked sooner.  Performed at [facility], [place] [address] [place], [place] [address]      Time coordinating discharge: 35 minutes  SIGNED:   Cordelia Poche, MD [facility] 03/10/2021, 8:08 AM

## 2021-03-07 NOTE — Progress Notes (Signed)
PROGRESS NOTE    Elaine Rivera  TIR:[id number] DOB: 1946-05-15 DOA: 03/04/2021 PCP: Carol Ada, MD   Brief Narrative: Elaine Rivera is a 75 y.o. 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 Patient presented secondary to falls, weakness and speech difficulty found to have a subacute stroke in addition to evidence of focal seizure. Neurology consulted for LTM EEG. Patient started on Keppra IV.   Assessment & Plan:   Principal Problem:   Ischemic cerebrovascular accident (CVA) ([place]) Active Problems:   Depression   HTN (hypertension)   Hypokalemia   Anxiety   Transient speech disturbance   Lung nodule   Fall   Focal seizure [facility])   CVA (cerebral vascular accident) ([place])   Subacute ischemic CVA Hyperlipidemia MRI significant for evidence of small subacute right cerebellar infarct. Hemoglobin A1C of 5.5%. LDL of 101. Transthoracic Echocardiogram significant for no evidence of intracardiac embolic source. PT/OT recommending CIR.  -Neurology recommendations: pending today -Continue Lipitor 40 mg daily  Focal seizures Resultant aphasia. Seizures diagnosed via LTM EEG. Patient started on Keppra load followed by Keppra 500 mg BID -Neurology recommendations: pending today -Continue Keppra 500 mg  Hypokalemia Improved with supplementation.  Lung nodule Incidental finding. Recommendation for dedicated CT chest for further workup which revealed two 7 mm nodules in RLL. Recommendation for non-contrast chest CT at 3-6 months then if stable, then future CT at 18-24 months (from today's scan) is considered optional for low-risk patients  Depression Anxiety -Continue Zoloft and Remeron -Hold Xanax while prescribed prn Ativan   DVT prophylaxis: Lovenox Code Status:   Code Status: Full Code Family Communication: Daughter on telephone, but no response Disposition Plan: Discharge possibly to  CIR per PT recommendations pending continued neurology recommendations/management   Consultants:  Neurology  Procedures:  LTM EEG  Antimicrobials: None    Subjective: No concerns today.  Objective: Vitals:   03/06/21 1615 03/06/21 1942 03/06/21 2351 03/07/21 0503  BP: 97/60 117/65 114/65 130/64  Pulse: 76 83 68 67  Resp: 20 17 18 18   Temp: 98.1 F (36.7 C) 98.1 F (36.7 C) 98.5 F (36.9 C) (!) 97.5 F (36.4 C)  TempSrc:  Oral Oral Oral  SpO2: 100% 97% 97% 98%  Weight:      Height:        Intake/Output Summary (Last 24 hours) at 03/07/2021 0951 Last data filed at 03/07/2021 0506 Gross per 24 hour  Intake 200 ml  Output 1350 ml  Net -1150 ml    Filed Weights   03/04/21 1933  Weight: 59 kg    Examination:  General exam: Appears calm and comfortable Respiratory system: Clear to auscultation. Respiratory effort normal. Cardiovascular system: S1 & S2 heard, RRR. No murmurs, rubs, gallops or clicks. Gastrointestinal system: Abdomen is nondistended, soft and nontender. No organomegaly or masses felt. Normal bowel sounds heard. Central nervous system: Alert and oriented. No focal neurological deficits. Musculoskeletal: No edema. No calf tenderness Skin: No cyanosis. No rashes Psychiatry: Judgement and insight appear normal. Mood & affect appropriate.     Data Reviewed: I have personally reviewed following labs and imaging studies  CBC Lab Results  Component Value Date   WBC 7.3 03/06/2021   RBC 4.54 03/06/2021   HGB 11.2 (L) 03/06/2021   HCT 37.0 03/06/2021   MCV 81.5 03/06/2021   MCH 24.7 (L) 03/06/2021   PLT 274 03/06/2021   MCHC 30.3 03/06/2021   RDW 17.9 (H) 03/06/2021  LYMPHSABS 1.1 03/01/2020   MONOABS 0.7 03/01/2020   EOSABS 0.1 03/01/2020   BASOSABS 0.0 10/06/3233     Last metabolic panel Lab Results  Component Value Date   NA 139 03/06/2021   K 3.4 (L) 03/06/2021   CL 110 03/06/2021   CO2 17 (L) 03/06/2021   BUN 7 (L) 03/06/2021    CREATININE 0.63 03/06/2021   GLUCOSE 67 (L) 03/06/2021   GFRNONAA >60 03/06/2021   GFRAA >60 03/01/2020   CALCIUM 6.8 (L) 03/06/2021   PROT 6.8 03/04/2021   ALBUMIN 3.8 03/04/2021   BILITOT 0.7 03/04/2021   ALKPHOS 80 03/04/2021   AST 40 03/04/2021   ALT 24 03/04/2021   ANIONGAP 12 03/06/2021    CBG (last 3)  No results for input(s): GLUCAP in the last 72 hours.   GFR: Estimated Creatinine Clearance: 47.5 mL/min (by C-G formula based on SCr of 0.63 mg/dL).  Coagulation Profile: No results for input(s): INR, PROTIME in the last 168 hours.  Recent Results (from the past 240 hour(s))  Resp Panel by RT-PCR (Flu A&B, Covid) Nasopharyngeal Swab     Status: None   Collection Time: 03/05/21  2:13 AM   Specimen: Nasopharyngeal Swab; Nasopharyngeal(NP) swabs in vial transport medium  Result Value Ref Range Status   SARS Coronavirus 2 by RT PCR NEGATIVE NEGATIVE Final    Comment: (NOTE) SARS-CoV-2 target nucleic acids are NOT DETECTED.  The SARS-CoV-2 RNA is generally detectable in upper respiratory specimens during the acute phase of infection. The lowest concentration of SARS-CoV-2 viral copies this assay can detect is 138 copies/mL. A negative result does not preclude SARS-Cov-2 infection and should not be used as the sole basis for treatment or other patient management decisions. A negative result may occur with  improper specimen collection/handling, submission of specimen other than nasopharyngeal swab, presence of viral mutation(s) within the areas targeted by this assay, and inadequate number of viral copies(<138 copies/mL). A negative result must be combined with clinical observations, patient history, and epidemiological information. The expected result is Negative.  Fact Sheet for Patients:  EntrepreneurPulse.com.au  Fact Sheet for Healthcare Providers:  IncredibleEmployment.be  This test is no t yet approved or cleared by the  [place] FDA and  has been authorized for detection and/or diagnosis of SARS-CoV-2 by FDA under an Emergency Use Authorization (EUA). This EUA will remain  in effect (meaning this test can be used) for the duration of the COVID-19 declaration under Section 564(b)(1) of the Act, 21 U.S.C.section 360bbb-3(b)(1), unless the authorization is terminated  or revoked sooner.       Influenza A by PCR NEGATIVE NEGATIVE Final   Influenza B by PCR NEGATIVE NEGATIVE Final    Comment: (NOTE) The Xpert Xpress SARS-CoV-2/FLU/RSV plus assay is intended as an aid in the diagnosis of influenza from Nasopharyngeal swab specimens and should not be used as a sole basis for treatment. Nasal washings and aspirates are unacceptable for Xpert Xpress SARS-CoV-2/FLU/RSV testing.  Fact Sheet for Patients: EntrepreneurPulse.com.au  Fact Sheet for Healthcare Providers: IncredibleEmployment.be  This test is not yet approved or cleared by the [place] FDA and has been authorized for detection and/or diagnosis of SARS-CoV-2 by FDA under an Emergency Use Authorization (EUA). This EUA will remain in effect (meaning this test can be used) for the duration of the COVID-19 declaration under Section 564(b)(1) of the Act, 21 U.S.C. section 360bbb-3(b)(1), unless the authorization is terminated or revoked.  Performed at [facility], [address]  [place], [place] [address]   Culture, Urine     Status: Abnormal   Collection Time: 03/06/21  8:42 AM   Specimen: Urine, Random  Result Value Ref Range Status   Specimen Description URINE, RANDOM  Final   Special Requests   Final    NONE Performed at [facility], [place] [address], [place],  [address]    Culture MULTIPLE SPECIES PRESENT, SUGGEST RECOLLECTION (A)  Final   Report Status 03/07/2021 FINAL  Final        Radiology Studies: CT CHEST WO CONTRAST  Result Date:  03/06/2021 CLINICAL DATA:  Lung nodule EXAM: CT CHEST WITHOUT CONTRAST TECHNIQUE: Multidetector CT imaging of the chest was performed following the standard protocol without IV contrast. Sagittal and coronal MPR images reconstructed from axial data set. COMPARISON:  CT angio head/neck 03/04/2021 FINDINGS: Cardiovascular: Atherosclerotic calcifications aorta and proximal great vessels. Tortuous thoracic aorta related to scoliosis. Aorta normal caliber. Heart unremarkable. No pericardial effusion. Mediastinum/Nodes: Large hiatal hernia. Esophagus unremarkable. Base of cervical region normal appearance. No thoracic adenopathy. Lungs/Pleura: Mild RIGHT apical scarring. 7 mm RIGHT lower lobe nodule image 75. Additional 7 mm subpleural nodule RIGHT lower lobe laterally image 83. Bibasilar atelectasis. Scattered infiltrate RIGHT lower lobe. Subsegmental atelectasis at medial aspect base of RIGHT middle lobe. No pleural effusion or pneumothorax. Upper Abdomen: Nonobstructing BILATERAL renal calculi. Remaining visualized upper abdomen unremarkable. Musculoskeletal: Significant thoracolumbar scoliosis and osseous demineralization. IMPRESSION: Large hiatal hernia. Bibasilar atelectasis with scattered infiltrate in RIGHT lower lobe. Nonobstructing BILATERAL renal calculi. Two 7 mm nodules in RIGHT lower lobe, nonspecific; non-contrast chest CT at 3-6 months is recommended. If the nodules are stable at time of repeat CT, then future CT at 18-24 months (from today's scan) is considered optional for low-risk patients, but is recommended for high-risk patients. This recommendation follows the consensus statement: Guidelines for Management of Incidental Pulmonary Nodules Detected on CT Images: From the Fleischner Society 2017; Radiology 2017; 284:228-243. Aortic Atherosclerosis (ICD10-I70.0). Electronically Signed   By: Lavonia Dana M.D.   On: 03/06/2021 16:27   EEG adult  Result Date: 03/05/2021 Lora Havens, MD     03/05/2021   5:33 PM Patient Name: Elaine Rivera MRN: [id number] Epilepsy Attending: Lora Havens Referring Physician/Provider: Dr Mitzi Hansen Date: 03/05/2021 Duration: 24.09 mins Patient history: 75yo F with transient aphasia. EEG to evaluate for seizure Level of alertness: Awake AEDs during EEG study: None Technical aspects: This EEG study was done with scalp electrodes positioned according to the 10-20 International system of electrode placement. Electrical activity was acquired at a sampling rate of 500Hz  and reviewed with a high frequency filter of 70Hz  and a low frequency filter of 1Hz . EEG data were recorded continuously and digitally stored. Description: The posterior dominant rhythm consists of 9 Hz activity of moderate voltage (25-35 uV) seen predominantly in posterior head regions, symmetric and reactive to eye opening and eye closing. Event button was pressed at 1543 for speech disturbance,unable to answer questions. Concomitant EEG showed rhythmic sharply contoured 7-8hz  alpha activity arising from left centro-temporal region  which evolved into 5-6hz  theta slowing  which gradually involved all of left hemisphere consistent with focal seizure. The seizure started at 1542 and lasted about 6 minutes. Hyperventilation and photic stimulation were not performed.   ABNORMALITY - Focal seizure, left centro-temporal region IMPRESSION: This study showed showed one seizure on 03/05/2021 at 1542 arising from left centro-temporal region during which patient was unable to speak, lasted for about 6 minutes.  Dr Myna Hidalgo was notified. Priyanka Barbra Sarks   Overnight EEG with video  Result Date: 03/06/2021 Lora Havens, MD     03/06/2021  1:19 PM Patient Name: Elaine Rivera MRN: [id number] Epilepsy Attending: Lora Havens Referring Physician/Provider: Dr Mitzi Hansen Duration: 03/05/2021 1550 to 03/06/2021 1237  Patient history: 75yo F with transient aphasia. EEG to evaluate for seizure  Level of alertness: Awake, asleep   AEDs during EEG study: LEV  Technical aspects: This EEG study was done with scalp electrodes positioned according to the 10-20 International system of electrode placement. Electrical activity was acquired at a sampling rate of 500Hz  and reviewed with a high frequency filter of 70Hz  and a low frequency filter of 1Hz . EEG data were recorded continuously and digitally stored.  Description: The posterior dominant rhythm consists of 9 Hz activity of moderate voltage (25-35 uV) seen predominantly in posterior head regions, symmetric and reactive to eye opening and eye closing.  Sleep was characterized by vertex waves, sleep spindles (12 to 14 Hz), maximal frontocentral region.  One seizure was recorded on 03/05/2021 at 1726 during which patient was sitting in bed, trying to speak but having some trouble.  Concomitant EEG showed rhythmic sharply contoured 7-8hz  alpha activity arising from left centro-temporal region  which evolved into 5-6hz  theta slowing and gradually involved all of left hemisphere The seizure lasted about 1.5 minutes. Hyperventilation and photic stimulation were not performed.    ABNORMALITY - Focal seizure, left centro-temporal region  IMPRESSION: This study showed showed one seizure on 03/05/2021 at 1726 arising from left centro-temporal region during which patient had trouble speaking, lasted for about 1.5 minutes.  Lora Havens   ECHOCARDIOGRAM COMPLETE  Result Date: 03/05/2021    ECHOCARDIOGRAM REPORT   Patient Name:   Elaine Rivera Date of Exam: 03/05/2021 Medical Rec #:  [id number]         Height:       59.0 in Accession #:    [id number]        Weight:       130.0 lb Date of Birth:  12/07/1945         BSA:          1.536 m Patient Age:    80 years          BP:           132/72 mmHg Patient Gender: F                 HR:           64 bpm. Exam Location:  Inpatient Procedure: 2D Echo, Cardiac Doppler and Color Doppler Indications:    Stroke  History:        Patient has prior history of  Echocardiogram examinations, most                 recent 02/29/2020. Risk Factors:Hypertension.  Sonographer:    Cammy Brochure Referring Phys: [id number] [place]  1. Left ventricular ejection fraction, by estimation, is 60 to 65%. The left ventricle has normal function. The left ventricle has no regional wall motion abnormalities. There is mild left ventricular hypertrophy. Left ventricular diastolic parameters are consistent with Grade I diastolic dysfunction (impaired relaxation).  2. Right ventricular systolic function is normal. The right ventricular size is normal.  3. The mitral valve is normal in structure. Mild mitral valve regurgitation. No evidence of mitral stenosis.  4. The aortic valve is normal in structure.  Aortic valve regurgitation is not visualized. No aortic stenosis is present.  5. The inferior vena cava is normal in size with greater than 50% respiratory variability, suggesting right atrial pressure of 3 mmHg. Comparison(s): No significant change from prior study. Prior images reviewed side by side. Conclusion(s)/Recommendation(s): No intracardiac source of embolism detected on this transthoracic study. A transesophageal echocardiogram is recommended to exclude cardiac source of embolism if clinically indicated. FINDINGS  Left Ventricle: Left ventricular ejection fraction, by estimation, is 60 to 65%. The left ventricle has normal function. The left ventricle has no regional wall motion abnormalities. The left ventricular internal cavity size was normal in size. There is  mild left ventricular hypertrophy. Left ventricular diastolic parameters are consistent with Grade I diastolic dysfunction (impaired relaxation). Right Ventricle: The right ventricular size is normal. No increase in right ventricular wall thickness. Right ventricular systolic function is normal. Left Atrium: Left atrial size was normal in size. Right Atrium: Right atrial size was normal in size. Pericardium:  There is no evidence of pericardial effusion. Mitral Valve: The mitral valve is normal in structure. Mild mitral valve regurgitation. No evidence of mitral valve stenosis. MV peak gradient, 9.2 mmHg. The mean mitral valve gradient is 3.0 mmHg. Tricuspid Valve: The tricuspid valve is normal in structure. Tricuspid valve regurgitation is not demonstrated. No evidence of tricuspid stenosis. Aortic Valve: The aortic valve is normal in structure. Aortic valve regurgitation is not visualized. No aortic stenosis is present. Aortic valve mean gradient measures 4.0 mmHg. Aortic valve peak gradient measures 7.1 mmHg. Aortic valve area, by VTI measures 2.41 cm. Pulmonic Valve: The pulmonic valve was normal in structure. Pulmonic valve regurgitation is not visualized. No evidence of pulmonic stenosis. Aorta: The aortic root is normal in size and structure. Venous: The inferior vena cava is normal in size with greater than 50% respiratory variability, suggesting right atrial pressure of 3 mmHg. IAS/Shunts: No atrial level shunt detected by color flow Doppler.  LEFT VENTRICLE PLAX 2D LVIDd:         3.60 cm  Diastology LVIDs:         2.50 cm  LV e' medial:    5.98 cm/s LV PW:         1.10 cm  LV E/e' medial:  16.1 LV IVS:        1.30 cm  LV e' lateral:   9.46 cm/s LVOT diam:     1.90 cm  LV E/e' lateral: 10.1 LV SV:         67 LV SV Index:   43 LVOT Area:     2.84 cm  RIGHT VENTRICLE RV Basal diam:  2.70 cm RV S prime:     14.40 cm/s TAPSE (M-mode): 2.2 cm LEFT ATRIUM             Index       RIGHT ATRIUM          Index LA diam:        3.30 cm 2.15 cm/m  RA Area:     9.18 cm LA Vol (A2C):   36.0 ml 23.44 ml/m RA Volume:   16.20 ml 10.55 ml/m LA Vol (A4C):   38.3 ml 24.94 ml/m LA Biplane Vol: 37.3 ml 24.29 ml/m  AORTIC VALVE AV Area (Vmax):    2.26 cm AV Area (Vmean):   2.19 cm AV Area (VTI):     2.41 cm AV Vmax:           133.00 cm/s AV  Vmean:          88.400 cm/s AV VTI:            0.276 m AV Peak Grad:      7.1 mmHg AV  Mean Grad:      4.0 mmHg LVOT Vmax:         106.00 cm/s LVOT Vmean:        68.400 cm/s LVOT VTI:          0.235 m LVOT/AV VTI ratio: 0.85  AORTA Ao Root diam: 2.80 cm Ao Asc diam:  3.80 cm MITRAL VALVE                TRICUSPID VALVE MV Area (PHT): 3.48 cm     TR Peak grad:   26.8 mmHg MV Area VTI:   1.77 cm     TR Vmax:        259.00 cm/s MV Peak grad:  9.2 mmHg MV Mean grad:  3.0 mmHg     SHUNTS MV Vmax:       1.52 m/s     Systemic VTI:  0.24 m MV Vmean:      75.3 cm/s    Systemic Diam: 1.90 cm MV Decel Time: 218 msec MV E velocity: 96.00 cm/s MV A velocity: 138.00 cm/s MV E/A ratio:  0.70 Candee Furbish MD Electronically signed by Candee Furbish MD Signature Date/Time: 03/05/2021/2:22:34 PM    Final         Scheduled Meds:   stroke: mapping our early stages of recovery book   Does not apply Once   atorvastatin  40 mg Oral QAC supper   enoxaparin (LOVENOX) injection  40 mg Subcutaneous Q24H   levETIRAcetam  500 mg Oral Q12H   sertraline  50 mg Oral Daily   Continuous Infusions:     LOS: 1 day     Cordelia Poche, MD [facility] 03/07/2021, 9:51 AM  If 7PM-7AM, please contact night-coverage [email or web address]

## 2021-03-07 NOTE — Progress Notes (Signed)
Inpatient Rehab Admissions Coordinator:    I spoke with pt. Regarding potential CIR admission. She is interested but not sure if she will have adequate support at d/c . I will reach out to her daughter to see what support is available.  Clemens Catholic, [place], [place] Admissions Coordinator  [phone] ([place]) [phone] (office)

## 2021-03-07 NOTE — Progress Notes (Signed)
Physical Therapy Treatment Patient Details Name: Elaine Rivera MRN: [id number] DOB: [birth year] Today's Date: 03/07/2021    History of Present Illness 75 y/o female admitted secondary to falls, weakness, and dizziness. 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 PMH includes hypertension, cerebral aneurysm with surgical clipping in 1998 and right PICA aneurysm embolization with stent in 2019, GERD, depression, and anxiety.    PT Comments    Pt was seen for ROM assistance to BLE"s and then to stand and balance with RW for support.  Pt is more capable of controlling balance with walker but definitely requires help to balance.  Her attention is divided with activity, very ebullient and talking about her daughter visiting.  Pt is assisted with repetitive cues, but is ultimately able to follow instructions.  Continue on with all elements of acute PT goals, focus on her balance and standing endurance as tolerated.  Pt in imaging session to follow up on her symptoms, and will adjust her protocol as needed with her findings.  Follow up on CIR as discharge plan as pt is quite willing to work and should be able to tolerate therapy as needed.   Follow Up Recommendations  CIR     Equipment Recommendations  None recommended by PT    Recommendations for Other Services       Precautions / Restrictions Precautions Precautions: Fall Restrictions Weight Bearing Restrictions: No    Mobility  Bed Mobility Overal bed mobility: Needs Assistance Bed Mobility: Supine to Sit;Sit to Supine     Supine to sit: Min guard;Min assist Sit to supine: Min assist   General bed mobility comments: min assist to return to bed due to arrival of MRI transport    Transfers Overall transfer level: Needs assistance Equipment used: Rolling walker (2 wheeled);1 person hand held assist Transfers: Sit to/from Stand Sit to Stand: Min assist         General  transfer comment: better control of standing with walker  Ambulation/Gait             General Gait Details: wgt shifting with no steps taken   Stairs             Wheelchair Mobility    Modified Rankin (Stroke Patients Only)       Balance Overall balance assessment: Needs assistance Sitting-balance support: Feet supported;Bilateral upper extremity supported Sitting balance-Leahy Scale: Fair     Standing balance support: Bilateral upper extremity supported Standing balance-Leahy Scale: Poor                              Cognition Arousal/Alertness: Awake/alert Behavior During Therapy: WFL for tasks assessed/performed Overall Cognitive Status: Within Functional Limits for tasks assessed Area of Impairment: Safety/judgement;Problem solving;Following commands                   Current Attention Level: Selective   Following Commands: Follows one step commands inconsistently;Follows one step commands with increased time Safety/Judgement: Decreased awareness of deficits;Decreased awareness of safety   Problem Solving: Requires verbal cues;Requires tactile cues General Comments: Pt tangential in conversation, difficult to keep on task. Pt very pleasant, states "I feel wonderful". Pt repeating back PT cues to PT in questioning tone, at times does not complete multistep commands without repeated cuing.      Exercises General Exercises - Lower Extremity Ankle Circles/Pumps: AAROM;5 reps Heel Slides: AAROM;10 reps Hip ABduction/ADduction: AAROM;10 reps  General Comments General comments (skin integrity, edema, etc.): pt was up to side of bed with careful sequence, used bed rail for shifting to sit      Pertinent Vitals/Pain Pain Assessment: No/denies pain    Home Living                      Prior Function            PT Goals (current goals can now be found in the care plan section) Acute Rehab PT Goals Patient Stated Goal:  hoping to get home soon Progress towards PT goals: Progressing toward goals    Frequency    Min 4X/week      PT Plan Current plan remains appropriate    Co-evaluation              AM-PAC PT "6 Clicks" Mobility   Outcome Measure  Help needed turning from your back to your side while in a flat bed without using bedrails?: A Little Help needed moving from lying on your back to sitting on the side of a flat bed without using bedrails?: A Little Help needed moving to and from a bed to a chair (including a wheelchair)?: A Little Help needed standing up from a chair using your arms (e.g., wheelchair or bedside chair)?: A Little Help needed to walk in hospital room?: A Lot Help needed climbing 3-5 steps with a railing? : Total 6 Click Score: 15    End of Session Equipment Utilized During Treatment: Gait belt Activity Tolerance: Patient limited by fatigue;Treatment limited secondary to medical complications (Comment) Patient left: in bed;with call bell/phone within reach;with bed alarm set Nurse Communication: Mobility status PT Visit Diagnosis: Unsteadiness on feet (R26.81);Muscle weakness (generalized) (M62.81);Difficulty in walking, not elsewhere classified (R26.2)     Time: 2458-0998 PT Time Calculation (min) (ACUTE ONLY): 23 min  Charges:  $Therapeutic Exercise: 8-22 mins $Therapeutic Activity: 8-22 mins                    Ramond Dial 03/07/2021, 3:51 PM  Mee Hives, PT MS Acute Rehab Dept. Number: [place] [facility] [place]

## 2021-03-07 NOTE — NC FL2 (Signed)
[place] MEDICAID FL2 LEVEL OF CARE SCREENING TOOL     IDENTIFICATION  Patient Name: Elaine Rivera Birthdate: [birth year] Sex: female Admission Date (Current Location): 03/04/2021  [facility] and [place] Number:  Herbalist and Address:  [place] [place] [address], [place], [place] [address]      Provider Number: [id number]  Attending Physician Name and Address:  Mariel Aloe, MD  Relative Name and Phone Number:       Current Level of Care: Hospital Recommended Level of Care: [place] Prior Approval Number:    Date Approved/Denied:   PASRR Number: [id number]  Discharge Plan: SNF    Current Diagnoses: Patient Active Problem List   Diagnosis Date Noted   Focal seizure ([place]) 03/06/2021   CVA (cerebral vascular accident) ([place]) 03/06/2021   Ischemic cerebrovascular accident (CVA) ([place]) 03/05/2021   Hypokalemia 03/05/2021   Anxiety 03/05/2021   Transient speech disturbance 03/05/2021   Lung nodule 03/05/2021   Aphasia    Fall    Anemia 02/28/2020   Shortness of breath 01/24/2020   Dizziness 01/24/2020   Herpes 10/04/2018   Facial cellulitis 10/03/2018   Brain aneurysm 09/05/2018   NSAID long-term use 04/20/2016   Scoliosis 04/20/2016   Depression 04/20/2016   HTN (hypertension) 04/20/2016   GERD (gastroesophageal reflux disease) 04/20/2016   Occult GI bleeding 04/20/2016    Orientation RESPIRATION BLADDER Height & Weight     Self, Time, Situation, Place  Normal Incontinent Weight: 130 lb (59 kg) Height:  4\' 11"  (149.9 cm)  BEHAVIORAL SYMPTOMS/MOOD NEUROLOGICAL BOWEL NUTRITION STATUS    Convulsions/Seizures Continent Diet  AMBULATORY STATUS COMMUNICATION OF NEEDS Skin   Limited Assist Verbally Normal                       Personal Care Assistance Level of Assistance  Bathing, Feeding, Dressing Bathing Assistance: Limited assistance Feeding assistance: Limited assistance Dressing Assistance:  Limited assistance     Functional Limitations Info  Speech     Speech Info: Impaired (expressive aphasia)    SPECIAL CARE FACTORS FREQUENCY  PT (By licensed PT), OT (By licensed OT)     PT Frequency: 5x/wk OT Frequency: 5x/wk            Contractures Contractures Info: Not present    Additional Factors Info  Code Status, Allergies, Psychotropic Code Status Info: Full Allergies Info: Sulfa Antibiotics Psychotropic Info: Zoloft 50mg  daily         Current Medications (03/07/2021):  This is the current hospital active medication list Current Facility-Administered Medications  Medication Dose Route Frequency Provider Last Rate Last Admin    stroke: mapping our early stages of recovery book   Does not apply Once Opyd, Ilene Qua, MD       acetaminophen (TYLENOL) tablet 650 mg  650 mg Oral Q4H PRN Opyd, Ilene Qua, MD       Or   acetaminophen (TYLENOL) 160 MG/5ML solution 650 mg  650 mg Per Tube Q4H PRN Opyd, Ilene Qua, MD       Or   acetaminophen (TYLENOL) suppository 650 mg  650 mg Rectal Q4H PRN Opyd, Ilene Qua, MD       atorvastatin (LIPITOR) tablet 40 mg  40 mg Oral QAC supper Mariel Aloe, MD   40 mg at 03/06/21 1712   enoxaparin (LOVENOX) injection 40 mg  40 mg Subcutaneous Q24H Opyd, Ilene Qua, MD   40 mg at 03/07/21  0911   levETIRAcetam (KEPPRA) tablet 500 mg  500 mg Oral Q12H Rikki Spearing, NP   500 mg at 03/07/21 0911   LORazepam (ATIVAN) injection 1 mg  1 mg Intravenous Q4H PRN Pokhrel, Laxman, MD   1 mg at 03/05/21 1616   ondansetron (ZOFRAN) injection 4 mg  4 mg Intravenous Q6H PRN Mariel Aloe, MD   4 mg at 03/06/21 2130   senna-docusate (Senokot-S) tablet 1 tablet  1 tablet Oral QHS PRN Opyd, Ilene Qua, MD       sertraline (ZOLOFT) tablet 50 mg  50 mg Oral Daily Mariel Aloe, MD         Discharge Medications: Please see discharge summary for a list of discharge medications.  Relevant Imaging Results:  Relevant Lab Results:   Additional  Information SS#: [id number]  Geralynn Ochs, LCSW

## 2021-03-07 NOTE — TOC Progression Note (Signed)
Transition of Care [facility] - Progression Note    Patient Details  Name: JAMESA TEDRICK MRN: [id number] Date of Birth: Jun 28, 1946  Transition of Care [facility]) CM/SW [place], [place] Phone Number: 03/07/2021, 4:25 PM  Clinical Narrative:   CSW alerted by rehab admissions that patient requesting SNF at [facility], so CSW met with patient to confirm. Patient indicated that she was going to go to [facility], and CSW explained process for sending referral and obtaining insurance authorization, so it may not be tomorrow but CSW will work on it. Patient was appreciative and thanked CSW for all of her work. CSW sent referral to [facility] and they are reviewing for admission. CSW to continue to follow.    Expected Discharge Plan: [place] Barriers to Discharge: Continued Medical Work up, Ship broker  Expected Discharge Plan and Services Expected Discharge Plan: [place]   Discharge Planning Services: CM Consult Post Acute Care Choice: [place] Living arrangements for the past 2 months: Single Family Home                                       Social Determinants of Health (SDOH) Interventions    Readmission Risk Interventions No flowsheet data found.

## 2021-03-07 NOTE — Progress Notes (Signed)
Neurology Progress Note Subjective: EEG unhooked yesterday No further clinical seizures Tolerating LEV without SE No new complaints today Likely d/c to SNF tmrw  Additional MRI w contrast sequences obtained and showed no abnl enhancement   Exam: Vitals:   03/07/21 1140 03/07/21 1544  BP: 112/69 134/61  Pulse: 79 70  Resp: 20 18  Temp: 97.9 F (36.6 C) 98.2 F (36.8 C)  SpO2: 96% 99%   Gen: Laying comfortably in bed, in no acute distress Resp: non-labored breathing, no respiratory distress Abd: soft, non-tender  Neuro: Mental Status: Awake, alert, and oriented x3. Patient is able to give a clear and coherent history of present illness. Speech is intact with mild dysarthria and hypophonia. No aphasia is noted.  Naming, comprehension, and repetition intact.  Brief speech pauses noted in conversation but none > 10 seconds on assessment.  On assessment, patient did not experience speech arrest as observed during seizure events yesterday CN: PERRL 4 mm / brisk, EOMI with saccadic movements, visual fields full, facial sensation intact and symmetric to light touch, face is symmetric resting and smiling, hearing is intact to voice, shoulders shrug symmetrically, palate elevates symmetrically, tongue protrudes midline Motor: 5/5 strength present in bilateral upper and lower extremities without asymmetry. Fine tremor noted with intention of bilateral hands. No vertical drift present throughout. Improved pain of left shoulder with LUE movement today compared to yesterday.  Sensory: Intact to light touch bilaterally in all four extremities. DTRs: 1+ and symmetric patellae, 2+ and symmetric biceps  Gait: deferred  Pertinent Labs: CBC    Component Value Date/Time   WBC 7.3 03/06/2021 0356   RBC 4.54 03/06/2021 0356   HGB 11.2 (L) 03/06/2021 0356   HCT 37.0 03/06/2021 0356   PLT 274 03/06/2021 0356   MCV 81.5 03/06/2021 0356   MCH 24.7 (L) 03/06/2021 0356   MCHC 30.3 03/06/2021 0356    RDW 17.9 (H) 03/06/2021 0356   LYMPHSABS 1.1 03/01/2020 0525   MONOABS 0.7 03/01/2020 0525   EOSABS 0.1 03/01/2020 0525   BASOSABS 0.0 03/01/2020 0525   CMP     Component Value Date/Time   NA 139 03/06/2021 0356   K 3.4 (L) 03/06/2021 0356   CL 110 03/06/2021 0356   CO2 17 (L) 03/06/2021 0356   GLUCOSE 67 (L) 03/06/2021 0356   BUN 7 (L) 03/06/2021 0356   CREATININE 0.63 03/06/2021 0356   CALCIUM 6.8 (L) 03/06/2021 0356   PROT 6.8 03/04/2021 1924   ALBUMIN 3.8 03/04/2021 1924   AST 40 03/04/2021 1924   ALT 24 03/04/2021 1924   ALKPHOS 80 03/04/2021 1924   BILITOT 0.7 03/04/2021 1924   GFRNONAA >60 03/06/2021 0356   GFRAA >60 03/01/2020 0525   Lipid Panel     Component Value Date/Time   CHOL 175 03/05/2021 0400   TRIG 68 03/05/2021 0400   HDL 60 03/05/2021 0400   CHOLHDL 2.9 03/05/2021 0400   VLDL 14 03/05/2021 0400   LDLCALC 101 (H) 03/05/2021 0400   Lab Results  Component Value Date   HGBA1C 5.5 03/05/2021   Urinalysis    Component Value Date/Time   COLORURINE YELLOW 03/06/2021 0842   APPEARANCEUR CLEAR 03/06/2021 0842   LABSPEC 1.015 03/06/2021 0842   PHURINE 6.0 03/06/2021 0842   GLUCOSEU NEGATIVE 03/06/2021 0842   HGBUR SMALL (A) 03/06/2021 0842   BILIRUBINUR NEGATIVE 03/06/2021 0842   KETONESUR 80 (A) 03/06/2021 0842   PROTEINUR NEGATIVE 03/06/2021 0842   UROBILINOGEN 0.2 12/26/2019 1226   NITRITE  NEGATIVE 03/06/2021 0842   LEUKOCYTESUR MODERATE (A) 03/06/2021 0842   Imaging Reviewed: EEG overnight: "This study showed showed one seizure on 03/05/2021 at 1726 arising from left centro-temporal region during which patient had trouble speaking, lasted for about 1.5 minutes."  EEG 03/05/2021: "This study showed showed one seizure on 03/05/2021 at 1542 arising from left centro-temporal region during which patient was unable to speak, lasted for about 6 minutes."  CT-scan of the brain Interval endovascular repair of known right PICA aneurysm. The aneurysm sac  appears enlarged versus remote prior examination, though intervening examines are not available to determine interval change prior to endovascular therapy. The aneurysm sac demonstrates interval increase in size since prior examination measuring 21 mm in greatest dimension. Patency of the aneurysm sac is not well assessed on this examination and would be better assessed with MRI or formal arteriography.   Stable mild ventriculomegaly disproportionate to the degree of cerebral atrophy. Clinical correlation for signs and symptoms of normal pressure hydrocephalus may be helpful.   No acute intracranial hemorrhage or infarct.   Stable mild to moderate senescent change.   CT angio head and neck: 1. Negative CTA for large vessel occlusion. 2. Sequelae of prior endovascular treatment of right PICA aneurysm with pipeline stent in place within right V4 segment, stable in position. Residual aneurysm filling measuring 6 x 7 x 6 mm, little interval changed, previously measuring 6 x 6 x 6 mm on CTA from 10/03/2018. 3. Otherwise stable and negative CTA of the head and neck. No other hemodynamically significant or correctable stenosis. 4. 7 mm right lower lobe nodule, partially visualized. Further evaluation with dedicated cross-sectional imaging of the chest suggested for further evaluation.   MRI examination of the brain 1. 12 mm linear focus of diffusion abnormality involving the right cerebellum, suggesting a small subacute ischemic infarct. This is new as compared to previous MRI from 11/17/2019. 2. No other acute intracranial abnormality. 3. Partially thrombosed right PICA aneurysm, relatively stable in size measuring up to 17 mm with residual 7 mm internal flow void. 4. Diffuse ventricular prominence somewhat out of proportion to cortical sulcation, stable. While this finding may be related to underlying atrophy, a component of normal pressure hydrocephalus could also be considered in the correct clinical  setting. 5. Underlying moderate chronic microvascular ischemic disease. 6. Prior right suboccipital craniectomy.  Additional MRI w contrast sequences obtained 6/9 and showed no abnl enhancement   Echocardiogram 03/05/2021: "1. Left ventricular ejection fraction, by estimation, is 60 to 65%. The left ventricle has normal function. The left ventricle has no regional wall motion abnormalities. There is mild left ventricular hypertrophy. Left ventricular diastolic parameters are consistent with Grade I diastolic dysfunction (impaired relaxation).   2. Right ventricular systolic function is normal. The right ventricular size is normal.   3. The mitral valve is normal in structure. Mild mitral valve regurgitation. No evidence of mitral stenosis.   4. The aortic valve is normal in structure. Aortic valve regurgitation is not visualized. No aortic stenosis is present.   5. The inferior vena cava is normal in size with greater than 50% respiratory variability, suggesting right atrial pressure of 3 mmHg.  Comparison(s): No significant change from prior study. Prior images reviewed side by side.  Conclusion(s)/Recommendation(s): No intracardiac source of embolism detected on this transthoracic study. A transesophageal echocardiogram is recommended to exclude cardiac source of embolism if clinically indicated."  Assessment: 75 y.o. female with PMHx of frequent falls, right PICA aneurysm s/p stent placement, GERD,  depression, and anxiety who presented to [facility] after a fall on 03/04/2021 and acute, intermittent episodes of aphasia. - Initial work up revealed a small right cerebellar subacute infarct and a partially thrombosed right PICA aneurysm. EEG was obtained with concern for new onset seizure activity during aphasic episodes.  - Examination on [place] arrival revealed patient with increasing frequency of aphasic episodes. LTM EEG was initiated and patient was given 1 mg of Ativan and loaded with 1 gram of  Keppra with 500 mg BID maintenance dosing. One seizure captured on routine EEG 1542 lasting approximately 6 minutes, one other seizure captured overnight at 1726 lasting approximately 1.5 minutes both arising from centro-temporal region and associated with speech arrest. Ativan given at 1616 with Keppra load at 1747.  - MRI with evidence of a small subacute right cerebellar infarct and partially thrombosed right PICA aneurysm. - Patient with evidence of EEG changes during aphasic episodes consistent with new onset seizures. Recent fall possibly secondary to subacute small right cerebellar stroke.    Impression: New onset focal seizures Subacute small right cerebellar infarct   Recommendations: # New onset seizure activity - focal seizures w/ impaired awareness manifesting as aphasia. No generalization. Etiology not identified. - No further inpatient w/u indicated at this time - Continue keppra 500mg  po bid - I will arrange outpatient neurology f/u after d/c - No driving x6 mos   # Small subacute right cerebellar infarct # Partially thrombosed right PICA aneurysm - Stroke w/u completed - Continue atorvastatin 40mg  daily (LDL at goal 101) - Continue ASA 81mg  daily secondary stroke prevention  Tentative plan for d/c tmrw to SNF. Neurology will not continue to actively follow, but please re-engage if additional concerns arise.  Su Monks, MD [facility] [phone]  If 7pm- 7am, please page neurology on call as listed in [place].

## 2021-03-07 NOTE — Progress Notes (Signed)
Patient refused MRI scheduled tonight. Her reason being that she has weighted for a long time and they didn't take her to do the test on time. Patient rejected the education offered by this RN.  I asked if she would do it in the morning, and she said, "I don't even care about it anymore."

## 2021-03-08 MED ORDER — LOPERAMIDE HCL 2 MG PO CAPS
4.0000 mg | ORAL_CAPSULE | ORAL | Status: DC | PRN
  Administered 2021-03-08: 4 mg via ORAL
  Filled 2021-03-08 (×2): qty 2

## 2021-03-08 NOTE — TOC Initial Note (Signed)
Transition of Care [facility]) - Initial/Assessment Note    Patient Details  Name: Elaine Rivera MRN: [id number] Date of Birth: 01/19/1946  [facility]) CM/SW Contact:    Coralee P[place], [place] Phone Number: 03/08/2021, 4:10 PM  Clinical Narrative:                 CSW confirmed w/ pt and daughter that [facility] was there choice, and started insurance authorization.  Pt was approved for 7 days for SNF and will need to transfer within five days.  SNF Approval # [id number] Transport approved for 90 days Amb. Approval # [id number]. Family was notified that they were approved. Will follow up w/ [facility] to see when they will have a bed. SW will continue to follow.  Expected Discharge Plan: Skilled Nursing Facility Barriers to Discharge: Insurance Authorization   Patient Goals and CMS Choice Patient states their goals for this hospitalization and ongoing recovery are:: to get rehab at [email or web address] Compare Post Acute Care list provided to:: Patient Choice offered to / list presented to : Patient  Expected Discharge Plan and Services Expected Discharge Plan: [place]   Discharge Planning Services: CM Consult Post Acute Care Choice: [place] Living arrangements for the past 2 months: Single Family Home                                      Prior Living Arrangements/Services Living arrangements for the past 2 months: Single Family Home Lives with:: Self Patient language and need for interpreter reviewed:: Yes Do you feel safe going back to the place where you live?: Yes      Need for Family Participation in Patient Care: Yes (Comment) Care giver support system in place?: No (comment) Current home services: DME (walker/ cane) Criminal Activity/Legal Involvement Pertinent to Current Situation/Hospitalization: No - Comment as needed  Activities of Daily Living Home Assistive Devices/Equipment: Eyeglasses, Cane (specify quad or straight),  Walker (specify type) (single point cane, front wheeled walker) ADL Screening (condition at time of admission) Patient's cognitive ability adequate to safely complete daily activities?: No (patient having random black out spells lasting about 2 minutes) Is the patient deaf or have difficulty hearing?: No Does the patient have difficulty seeing, even when wearing glasses/contacts?: No Does the patient have difficulty concentrating, remembering, or making decisions?: No Patient able to express need for assistance with ADLs?: Yes Does the patient have difficulty dressing or bathing?: Yes Independently performs ADLs?: No Communication: Independent Dressing (OT): Needs assistance Is this a change from baseline?: Change from baseline, expected to last >3 days Grooming: Independent Feeding: Needs assistance Is this a change from baseline?: Change from baseline, expected to last >3 days Bathing: Needs assistance Is this a change from baseline?: Change from baseline, expected to last >3 days Toileting: Needs assistance Is this a change from baseline?: Change from baseline, expected to last >3days In/Out Bed: Needs assistance Is this a change from baseline?: Change from baseline, expected to last >3 days Walks in Home: Needs assistance Is this a change from baseline?: Change from baseline, expected to last >3 days Does the patient have difficulty walking or climbing stairs?: Yes (secondary to weakness) Weakness of Legs: Both Weakness of Arms/Hands: None  Permission Sought/Granted Permission sought to share information with : Family Supports Permission granted to share information with : Yes, Verbal Permission Granted  Share Information with NAME: [employer]  [place]     Permission granted to share info w Relationship: Daughter  Permission granted to share info w Contact Information: [phone]  Emotional Assessment Appearance:: Appears stated age Attitude/Demeanor/Rapport: Engaged Affect  (typically observed): Appropriate Orientation: : Oriented to Self, Oriented to Place, Oriented to  Time, Oriented to Situation Alcohol / Substance Use: Not Applicable Psych Involvement: No (comment)  Admission diagnosis:  Aphasia [R47.01] Fall, initial encounter [W19.XXXA] Ischemic cerebrovascular accident (CVA) ([place]) [I63.9] CVA (cerebral vascular accident) [facility]) [I63.9] Patient Active Problem List   Diagnosis Date Noted   Focal seizure ([place]) 03/06/2021   CVA (cerebral vascular accident) ([place]) 03/06/2021   Ischemic cerebrovascular accident (CVA) ([place]) 03/05/2021   Hypokalemia 03/05/2021   Anxiety 03/05/2021   Transient speech disturbance 03/05/2021   Lung nodule 03/05/2021   Aphasia    Fall    Anemia 02/28/2020   Shortness of breath 01/24/2020   Dizziness 01/24/2020   Herpes 10/04/2018   Facial cellulitis 10/03/2018   Brain aneurysm 09/05/2018   NSAID long-term use 04/20/2016   Scoliosis 04/20/2016   Depression 04/20/2016   HTN (hypertension) 04/20/2016   GERD (gastroesophageal reflux disease) 04/20/2016   Occult GI bleeding 04/20/2016   PCP:  Carol Ada, MD Pharmacy:   [facility], [address] [place] [address] Phone: [phone] Fax: [phone]     Social Determinants of Health (SDOH) Interventions    Readmission Risk Interventions No flowsheet data found.

## 2021-03-08 NOTE — Progress Notes (Signed)
PROGRESS NOTE    Elaine Rivera  OZH:[id number] DOB: 1946/08/21 DOA: 03/04/2021 PCP: Carol Ada, MD   Brief Narrative: Elaine Rivera is a 75 y.o. 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 Patient presented secondary to falls, weakness and speech difficulty found to have a subacute stroke in addition to evidence of focal seizure. Neurology consulted for LTM EEG. Patient started on Keppra IV.   Assessment & Plan:   Principal Problem:   Ischemic cerebrovascular accident (CVA) ([place]) Active Problems:   Depression   HTN (hypertension)   Hypokalemia   Anxiety   Transient speech disturbance   Lung nodule   Fall   Focal seizure [facility])   CVA (cerebral vascular accident) ([place])   Subacute ischemic CVA Hyperlipidemia MRI significant for evidence of small subacute right cerebellar infarct. Hemoglobin A1C of 5.5%. LDL of 101. Transthoracic Echocardiogram significant for no evidence of intracardiac embolic source. PT/OT recommending CIR.  -Neurology recommendations: Aspirin 81 mg daily, Lipitor -Continue Lipitor 40 mg daily  Focal seizures Resultant aphasia. Seizures diagnosed via LTM EEG. Patient started on Keppra load followed by Keppra 500 mg BID -Neurology recommendations: Continue Keppra, outpatient follow-up, no driving x6 months -Continue Keppra 500 mg  Hypokalemia Improved with supplementation.  Lung nodule Incidental finding. Recommendation for dedicated CT chest for further workup which revealed two 7 mm nodules in RLL. Recommendation for non-contrast chest CT at 3-6 months then if stable, then future CT at 18-24 months (from today's scan) is considered optional for low-risk patients  Depression Anxiety -Continue Zoloft and Remeron -Hold Xanax while prescribed prn Ativan   DVT prophylaxis: Lovenox Code Status:   Code Status: Full Code Family Communication: None at  bedside Disposition Plan: Discharge to SNF when bed available. Medically stable for discharge.   Consultants:  Neurology  Procedures:  LTM EEG  Antimicrobials: None    Subjective: No issues overnight.  Objective: Vitals:   03/07/21 2031 03/08/21 0035 03/08/21 0444 03/08/21 0831  BP: 121/68 116/65 119/63 120/83  Pulse: 71 66 73 81  Resp:    19  Temp: 98 F (36.7 C) 97.9 F (36.6 C) 97.7 F (36.5 C) (!) 97.3 F (36.3 C)  TempSrc: Temporal Temporal Temporal Oral  SpO2: 99% 98% 98% 97%  Weight:      Height:        Intake/Output Summary (Last 24 hours) at 03/08/2021 0900 Last data filed at 03/07/2021 2117 Gross per 24 hour  Intake 680 ml  Output 700 ml  Net -20 ml    Filed Weights   03/04/21 1933  Weight: 59 kg    Examination:  General exam: Appears calm and comfortable Respiratory system: Clear to auscultation. Respiratory effort normal. Cardiovascular system: S1 & S2 heard, RRR. No murmurs, rubs, gallops or clicks. Gastrointestinal system: Abdomen is nondistended, soft and nontender. No organomegaly or masses felt. Normal bowel sounds heard. Central nervous system: Alert and oriented. No focal neurological deficits. Musculoskeletal: No edema. No calf tenderness Skin: No cyanosis. No rashes Psychiatry: Judgement and insight appear normal. Mood & affect appropriate.     Data Reviewed: I have personally reviewed following labs and imaging studies  CBC Lab Results  Component Value Date   WBC 7.3 03/06/2021   RBC 4.54 03/06/2021   HGB 11.2 (L) 03/06/2021   HCT 37.0 03/06/2021   MCV 81.5 03/06/2021   MCH 24.7 (L) 03/06/2021   PLT 274 03/06/2021   MCHC 30.3 03/06/2021   RDW  17.9 (H) 03/06/2021   LYMPHSABS 1.1 03/01/2020   MONOABS 0.7 03/01/2020   EOSABS 0.1 03/01/2020   BASOSABS 0.0 52/77/8242     Last metabolic panel Lab Results  Component Value Date   NA 139 03/06/2021   K 3.4 (L) 03/06/2021   CL 110 03/06/2021   CO2 17 (L) 03/06/2021    BUN 7 (L) 03/06/2021   CREATININE 0.63 03/06/2021   GLUCOSE 67 (L) 03/06/2021   GFRNONAA >60 03/06/2021   GFRAA >60 03/01/2020   CALCIUM 6.8 (L) 03/06/2021   PROT 6.8 03/04/2021   ALBUMIN 3.8 03/04/2021   BILITOT 0.7 03/04/2021   ALKPHOS 80 03/04/2021   AST 40 03/04/2021   ALT 24 03/04/2021   ANIONGAP 12 03/06/2021    CBG (last 3)  No results for input(s): GLUCAP in the last 72 hours.   GFR: Estimated Creatinine Clearance: 47.5 mL/min (by C-G formula based on SCr of 0.63 mg/dL).  Coagulation Profile: No results for input(s): INR, PROTIME in the last 168 hours.  Recent Results (from the past 240 hour(s))  Resp Panel by RT-PCR (Flu A&B, Covid) Nasopharyngeal Swab     Status: None   Collection Time: 03/05/21  2:13 AM   Specimen: Nasopharyngeal Swab; Nasopharyngeal(NP) swabs in vial transport medium  Result Value Ref Range Status   SARS Coronavirus 2 by RT PCR NEGATIVE NEGATIVE Final    Comment: (NOTE) SARS-CoV-2 target nucleic acids are NOT DETECTED.  The SARS-CoV-2 RNA is generally detectable in upper respiratory specimens during the acute phase of infection. The lowest concentration of SARS-CoV-2 viral copies this assay can detect is 138 copies/mL. A negative result does not preclude SARS-Cov-2 infection and should not be used as the sole basis for treatment or other patient management decisions. A negative result may occur with  improper specimen collection/handling, submission of specimen other than nasopharyngeal swab, presence of viral mutation(s) within the areas targeted by this assay, and inadequate number of viral copies(<138 copies/mL). A negative result must be combined with clinical observations, patient history, and epidemiological information. The expected result is Negative.  Fact Sheet for Patients:  EntrepreneurPulse.com.au  Fact Sheet for Healthcare Providers:  IncredibleEmployment.be  This test is no t yet  approved or cleared by the [place] FDA and  has been authorized for detection and/or diagnosis of SARS-CoV-2 by FDA under an Emergency Use Authorization (EUA). This EUA will remain  in effect (meaning this test can be used) for the duration of the COVID-19 declaration under Section 564(b)(1) of the Act, 21 U.S.C.section 360bbb-3(b)(1), unless the authorization is terminated  or revoked sooner.       Influenza A by PCR NEGATIVE NEGATIVE Final   Influenza B by PCR NEGATIVE NEGATIVE Final    Comment: (NOTE) The Xpert Xpress SARS-CoV-2/FLU/RSV plus assay is intended as an aid in the diagnosis of influenza from Nasopharyngeal swab specimens and should not be used as a sole basis for treatment. Nasal washings and aspirates are unacceptable for Xpert Xpress SARS-CoV-2/FLU/RSV testing.  Fact Sheet for Patients: EntrepreneurPulse.com.au  Fact Sheet for Healthcare Providers: IncredibleEmployment.be  This test is not yet approved or cleared by the [place] FDA and has been authorized for detection and/or diagnosis of SARS-CoV-2 by FDA under an Emergency Use Authorization (EUA). This EUA will remain in effect (meaning this test can be used) for the duration of the COVID-19 declaration under Section 564(b)(1) of the Act, 21 U.S.C. section 360bbb-3(b)(1), unless the authorization is terminated or revoked.  Performed at [facility], [place] [address] [place], [place] [address]   Culture, Urine     Status: Abnormal   Collection Time: 03/06/21  8:42 AM   Specimen: Urine, Random  Result Value Ref Range Status   Specimen Description URINE, RANDOM  Final   Special Requests   Final    NONE Performed at [facility], [place] [address], [place], [place] [address]    Culture MULTIPLE SPECIES PRESENT, SUGGEST RECOLLECTION (A)  Final   Report Status 03/07/2021 FINAL  Final        Radiology Studies: CT CHEST WO  CONTRAST  Result Date: 03/06/2021 CLINICAL DATA:  Lung nodule EXAM: CT CHEST WITHOUT CONTRAST TECHNIQUE: Multidetector CT imaging of the chest was performed following the standard protocol without IV contrast. Sagittal and coronal MPR images reconstructed from axial data set. COMPARISON:  CT angio head/neck 03/04/2021 FINDINGS: Cardiovascular: Atherosclerotic calcifications aorta and proximal great vessels. Tortuous thoracic aorta related to scoliosis. Aorta normal caliber. Heart unremarkable. No pericardial effusion. Mediastinum/Nodes: Large hiatal hernia. Esophagus unremarkable. Base of cervical region normal appearance. No thoracic adenopathy. Lungs/Pleura: Mild RIGHT apical scarring. 7 mm RIGHT lower lobe nodule image 75. Additional 7 mm subpleural nodule RIGHT lower lobe laterally image 83. Bibasilar atelectasis. Scattered infiltrate RIGHT lower lobe. Subsegmental atelectasis at medial aspect base of RIGHT middle lobe. No pleural effusion or pneumothorax. Upper Abdomen: Nonobstructing BILATERAL renal calculi. Remaining visualized upper abdomen unremarkable. Musculoskeletal: Significant thoracolumbar scoliosis and osseous demineralization. IMPRESSION: Large hiatal hernia. Bibasilar atelectasis with scattered infiltrate in RIGHT lower lobe. Nonobstructing BILATERAL renal calculi. Two 7 mm nodules in RIGHT lower lobe, nonspecific; non-contrast chest CT at 3-6 months is recommended. If the nodules are stable at time of repeat CT, then future CT at 18-24 months (from today's scan) is considered optional for low-risk patients, but is recommended for high-risk patients. This recommendation follows the consensus statement: Guidelines for Management of Incidental Pulmonary Nodules Detected on CT Images: From the Fleischner Society 2017; Radiology 2017; 284:228-243. Aortic Atherosclerosis (ICD10-I70.0). Electronically Signed   By: Lavonia Dana M.D.   On: 03/06/2021 16:27   MR BRAIN W CONTRAST  Result Date:  03/07/2021 CLINICAL DATA:  Seizure, abnormal neuro exam EXAM: MRI HEAD WITH CONTRAST TECHNIQUE: Multiplanar, multiecho pulse sequences of the brain and surrounding structures were obtained with intravenous contrast. CONTRAST:  48mL GADAVIST GADOBUTROL 1 MMOL/ML IV SOLN COMPARISON:  None. FINDINGS: Brain: There is no abnormal enhancement. Stable prominence of the ventricles disproportionate to the sulci. Vascular: Partially thrombosed treated right PICA aneurysm is again noted. Skull and upper cervical spine: Unremarkable. Sinuses/Orbits: No new finding. Other: None. IMPRESSION: No abnormal parenchymal or leptomeningeal enhancement. Partially thrombosed treated right PICA aneurysm. Electronically Signed   By: Macy Mis M.D.   On: 03/07/2021 13:11   Overnight EEG with video  Result Date: 03/06/2021 Lora Havens, MD     03/06/2021  1:19 PM Patient Name: JAMILE REKOWSKI MRN: [id number] Epilepsy Attending: Lora Havens Referring Physician/Provider: Dr Mitzi Hansen Duration: 03/05/2021 1550 to 03/06/2021 1237  Patient history: 75yo F with transient aphasia. EEG to evaluate for seizure  Level of alertness: Awake, asleep  AEDs during EEG study: LEV  Technical aspects: This EEG study was done with scalp electrodes positioned according to the 10-20 International system of electrode placement. Electrical activity was acquired at a sampling rate of 500Hz  and reviewed with a high frequency filter of 70Hz  and a low frequency filter of 1Hz . EEG data were recorded continuously and digitally stored.  Description: The posterior dominant rhythm consists of 9 Hz activity of moderate voltage (25-35 uV) seen predominantly in posterior head regions, symmetric and reactive to eye opening and eye closing.  Sleep was characterized by vertex waves, sleep spindles (12 to 14 Hz), maximal frontocentral region.  One seizure was recorded on 03/05/2021 at 1726 during which patient was sitting in bed, trying to speak but having some  trouble.  Concomitant EEG showed rhythmic sharply contoured 7-8hz  alpha activity arising from left centro-temporal region  which evolved into 5-6hz  theta slowing and gradually involved all of left hemisphere The seizure lasted about 1.5 minutes. Hyperventilation and photic stimulation were not performed.    ABNORMALITY - Focal seizure, left centro-temporal region  IMPRESSION: This study showed showed one seizure on 03/05/2021 at 1726 arising from left centro-temporal region during which patient had trouble speaking, lasted for about 1.5 minutes.  Priyanka Barbra Sarks        Scheduled Meds:   stroke: mapping our early stages of recovery book   Does not apply Once   atorvastatin  40 mg Oral QAC supper   enoxaparin (LOVENOX) injection  40 mg Subcutaneous Q24H   levETIRAcetam  500 mg Oral Q12H   sertraline  50 mg Oral Daily   Continuous Infusions:     LOS: 2 days     Cordelia Poche, MD [facility] 03/08/2021, 9:00 AM  If 7PM-7AM, please contact night-coverage [email or web address]

## 2021-03-08 NOTE — Evaluation (Signed)
Speech Language Pathology Evaluation Patient Details Name: Elaine Rivera MRN: [id number] DOB: Sep 24, 1946 Today's Date: 03/08/2021 Time: 1115- 7408    Problem List:  Patient Active Problem List   Diagnosis Date Noted   Focal seizure ([place]) 03/06/2021   CVA (cerebral vascular accident) ([place]) 03/06/2021   Ischemic cerebrovascular accident (CVA) ([place]) 03/05/2021   Hypokalemia 03/05/2021   Anxiety 03/05/2021   Transient speech disturbance 03/05/2021   Lung nodule 03/05/2021   Aphasia    Fall    Anemia 02/28/2020   Shortness of breath 01/24/2020   Dizziness 01/24/2020   Herpes 10/04/2018   Facial cellulitis 10/03/2018   Brain aneurysm 09/05/2018   NSAID long-term use 04/20/2016   Scoliosis 04/20/2016   Depression 04/20/2016   HTN (hypertension) 04/20/2016   GERD (gastroesophageal reflux disease) 04/20/2016   Occult GI bleeding 04/20/2016   Past Medical History:  Past Medical History:  Diagnosis Date   Anemia    years ago   Arthritis    Basal cell carcinoma of eye    biopsy of left eye/ non cancerous   Complication of anesthesia    nausea/vomiting   Depression    GERD (gastroesophageal reflux disease)    History of kidney stones    Hypertension    no longer on medications   Past Surgical History:  Past Surgical History:  Procedure Laterality Date   CEREBRAL ANEURYSM REPAIR     1998   COLONOSCOPY     ECTOPIC PREGNANCY SURGERY     x2    ENTEROSCOPY N/A 03/01/2020   Procedure: ENTEROSCOPY;  Surgeon: Carol Ada, MD;  Location: [facility];  Service: Endoscopy;  Laterality: N/A;   ESOPHAGOGASTRODUODENOSCOPY N/A 04/22/2016   Procedure: ESOPHAGOGASTRODUODENOSCOPY (EGD);  Surgeon: Juanita Craver, MD;  Location: [facility];  Service: Endoscopy;  Laterality: N/A;   IR 3D INDEPENDENT WKST  09/05/2018   IR ANGIO INTRA EXTRACRAN SEL COM CAROTID INNOMINATE BILAT MOD SED  09/05/2018   IR ANGIO VERTEBRAL SEL VERTEBRAL UNI R MOD SED  09/05/2018   IR ANGIOGRAM FOLLOW UP STUDY   09/05/2018   IR TRANSCATH/EMBOLIZ  09/05/2018   PARATHYROIDECTOMY     2002   RADIOLOGY WITH ANESTHESIA N/A 09/05/2018   Procedure: EMBOLIZATION;  Surgeon: Radiologist, Medication, MD;  Location: [place];  Service: Radiology;  Laterality: N/A;   TONSILLECTOMY     1953   VAGINAL HYSTERECTOMY     2001   VOCAL CORD LATERALIZATION, ENDOSCOPIC APPROACH W/ MLB     2007 (@[facility] )   WRIST SURGERY     left side/2008   WRIST SURGERY     right side/ 2002   HPI:  Elaine Rivera is a 75 y.o. 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 Patient presented secondary to falls, weakness and speech difficulty found to have a subacute stroke in addition to evidence of focal seizure. MRI significant for evidence of small subacute right cerebellar infarct.   Assessment / Plan / Recommendation Clinical Impression  Patient presents with impairments in the areas of short term memory, high level problem solving, and attention. Extremely verbose with intermittently decreased topic maintenance, not necessarily characteristic of cerebellar CVAs. Discussed with daughter over the phone who reports that above are baseline deficits for patient, which are impacting patient's ability to care for herself at home. Daughter in agreement with SNF placement. No acute f/u warranted as patient likely to d/c to SNF soon however recommend SLP f/u at SNF to maximize cognitive potential  for return to independent living if this is what patient/family wish.    SLP Assessment  SLP Recommendation/Assessment: All further Speech Lanaguage Pathology  needs can be addressed in the next venue of care SLP Visit Diagnosis: Attention and concentration deficit Attention and concentration deficit following: Cerebral infarction    Follow Up Recommendations  Skilled Nursing facility    Frequency and Duration           SLP Evaluation Cognition  Overall Cognitive  Status:  (suspect baseline, see impressions) Orientation Level: Oriented X4 Attention: Selective Selective Attention: Impaired Selective Attention Impairment: Verbal complex Memory: Impaired Memory Impairment: Storage deficit;Retrieval deficit Awareness: Impaired Awareness Impairment: Anticipatory impairment Problem Solving: Impaired Problem Solving Impairment: Verbal complex       Comprehension  Auditory Comprehension Overall Auditory Comprehension: Appears within functional limits for tasks assessed Reading Comprehension Reading Status: Not tested    Expression Expression Primary Mode of Expression: Verbal Verbal Expression Overall Verbal Expression:  (verbose, decreasd topic maintenance.) Written Expression Dominant Hand: Right   Oral / Motor  Oral Motor/Sensory Function Overall Oral Motor/Sensory Function: Within functional limits Motor Speech Overall Motor Speech: Impaired at baseline   GO                   Vannak [place] MA, CCC-SLP  Kipling Graser Meryl 03/08/2021, 11:52 AM

## 2021-03-08 NOTE — Progress Notes (Signed)
Inpatient Rehab Admissions Coordinator:   I spoke with Pt.'s daughter who reports family is not able to provide 24/7 support at d/c, which Pt.is likely to need. She is interested in SNF for rehab. Pt in agreement, TOC aware. CIR to s/o.  Clemens Catholic, [place], [place] Admissions Coordinator  [phone] ([place]) [phone] (office)

## 2021-03-09 LAB — SARS CORONAVIRUS 2 (TAT 6-24 HRS): SARS Coronavirus 2: NEGATIVE

## 2021-03-09 NOTE — Progress Notes (Signed)
PROGRESS NOTE    Elaine Rivera  ZTI:[id number] DOB: 09-Sep-1946 DOA: 03/04/2021 PCP: Carol Ada, MD   Brief Narrative: Elaine Rivera is a 75 y.o. 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 Patient presented secondary to falls, weakness and speech difficulty found to have a subacute stroke in addition to evidence of focal seizure. Neurology consulted for LTM EEG. Patient started on Keppra IV.   Assessment & Plan:   Principal Problem:   Ischemic cerebrovascular accident (CVA) ([place]) Active Problems:   Depression   HTN (hypertension)   Hypokalemia   Anxiety   Transient speech disturbance   Lung nodule   Fall   Focal seizure [facility])   CVA (cerebral vascular accident) ([place])   Subacute ischemic CVA Hyperlipidemia MRI significant for evidence of small subacute right cerebellar infarct. Hemoglobin A1C of 5.5%. LDL of 101. Transthoracic Echocardiogram significant for no evidence of intracardiac embolic source. PT/OT recommending CIR.  -Neurology recommendations: Aspirin 81 mg daily, Lipitor -Continue Lipitor 40 mg daily  Focal seizures Resultant aphasia. Seizures diagnosed via LTM EEG. Patient started on Keppra load followed by Keppra 500 mg BID. No recurrent seizures. -Neurology recommendations: Continue Keppra, outpatient follow-up, no driving x6 months -Continue Keppra 500 mg  Hypokalemia Improved with supplementation.  Lung nodule Incidental finding. Recommendation for dedicated CT chest for further workup which revealed two 7 mm nodules in RLL. Recommendation for non-contrast chest CT at 3-6 months then if stable, then future CT at 18-24 months (from today's scan) is considered optional for low-risk patients  Depression Anxiety -Continue Zoloft and Remeron -Hold Xanax while prescribed prn Ativan   DVT prophylaxis: Lovenox Code Status:   Code Status: Full Code Family  Communication: None at bedside Disposition Plan: Discharge to SNF when bed available; plan for discharge 6/13. Medically stable for discharge.   Consultants:  Neurology  Procedures:  LTM EEG  Antimicrobials: None    Subjective: No concerns overnight.  Objective: Vitals:   03/08/21 1959 03/09/21 0047 03/09/21 0512 03/09/21 0917  BP: 129/75 128/71 112/65 108/74  Pulse: 85 68 70 71  Resp: 19 18 17 19   Temp: 98 F (36.7 C) 97.8 F (36.6 C) 98.2 F (36.8 C) 98.3 F (36.8 C)  TempSrc: Temporal Oral Oral Oral  SpO2: 98% 94% 97% 95%  Weight:      Height:        Intake/Output Summary (Last 24 hours) at 03/09/2021 1134 Last data filed at 03/09/2021 0923 Gross per 24 hour  Intake 474 ml  Output 200 ml  Net 274 ml    Filed Weights   03/04/21 1933  Weight: 59 kg    Examination:  General: Appears calm and comfortable. Conversant Respiratory: Respiratory effort normal. No retractions noted. No tachypnea. Cardiovascular: No edema Gastrointestinal: Non-distended Skin: No cyanosis. No rashes Psychiatric: Judgement and insight appear normal. Oriented to person, place and time. Mood & affect appropriate.     Data Reviewed: I have personally reviewed following labs and imaging studies  CBC Lab Results  Component Value Date   WBC 7.3 03/06/2021   RBC 4.54 03/06/2021   HGB 11.2 (L) 03/06/2021   HCT 37.0 03/06/2021   MCV 81.5 03/06/2021   MCH 24.7 (L) 03/06/2021   PLT 274 03/06/2021   MCHC 30.3 03/06/2021   RDW 17.9 (H) 03/06/2021   LYMPHSABS 1.1 03/01/2020   MONOABS 0.7 03/01/2020   EOSABS 0.1 03/01/2020   BASOSABS 0.0 03/01/2020     Last  metabolic panel Lab Results  Component Value Date   NA 139 03/06/2021   K 3.4 (L) 03/06/2021   CL 110 03/06/2021   CO2 17 (L) 03/06/2021   BUN 7 (L) 03/06/2021   CREATININE 0.63 03/06/2021   GLUCOSE 67 (L) 03/06/2021   GFRNONAA >60 03/06/2021   GFRAA >60 03/01/2020   CALCIUM 6.8 (L) 03/06/2021   PROT 6.8 03/04/2021    ALBUMIN 3.8 03/04/2021   BILITOT 0.7 03/04/2021   ALKPHOS 80 03/04/2021   AST 40 03/04/2021   ALT 24 03/04/2021   ANIONGAP 12 03/06/2021    CBG (last 3)  No results for input(s): GLUCAP in the last 72 hours.   GFR: Estimated Creatinine Clearance: 47.5 mL/min (by C-G formula based on SCr of 0.63 mg/dL).  Coagulation Profile: No results for input(s): INR, PROTIME in the last 168 hours.  Recent Results (from the past 240 hour(s))  Resp Panel by RT-PCR (Flu A&B, Covid) Nasopharyngeal Swab     Status: None   Collection Time: 03/05/21  2:13 AM   Specimen: Nasopharyngeal Swab; Nasopharyngeal(NP) swabs in vial transport medium  Result Value Ref Range Status   SARS Coronavirus 2 by RT PCR NEGATIVE NEGATIVE Final    Comment: (NOTE) SARS-CoV-2 target nucleic acids are NOT DETECTED.  The SARS-CoV-2 RNA is generally detectable in upper respiratory specimens during the acute phase of infection. The lowest concentration of SARS-CoV-2 viral copies this assay can detect is 138 copies/mL. A negative result does not preclude SARS-Cov-2 infection and should not be used as the sole basis for treatment or other patient management decisions. A negative result may occur with  improper specimen collection/handling, submission of specimen other than nasopharyngeal swab, presence of viral mutation(s) within the areas targeted by this assay, and inadequate number of viral copies(<138 copies/mL). A negative result must be combined with clinical observations, patient history, and epidemiological information. The expected result is Negative.  Fact Sheet for Patients:  EntrepreneurPulse.com.au  Fact Sheet for Healthcare Providers:  IncredibleEmployment.be  This test is no t yet approved or cleared by the [place] FDA and  has been authorized for detection and/or diagnosis of SARS-CoV-2 by FDA under an Emergency Use Authorization (EUA). This EUA will remain   in effect (meaning this test can be used) for the duration of the COVID-19 declaration under Section 564(b)(1) of the Act, 21 U.S.C.section 360bbb-3(b)(1), unless the authorization is terminated  or revoked sooner.       Influenza A by PCR NEGATIVE NEGATIVE Final   Influenza B by PCR NEGATIVE NEGATIVE Final    Comment: (NOTE) The Xpert Xpress SARS-CoV-2/FLU/RSV plus assay is intended as an aid in the diagnosis of influenza from Nasopharyngeal swab specimens and should not be used as a sole basis for treatment. Nasal washings and aspirates are unacceptable for Xpert Xpress SARS-CoV-2/FLU/RSV testing.  Fact Sheet for Patients: EntrepreneurPulse.com.au  Fact Sheet for Healthcare Providers: IncredibleEmployment.be  This test is not yet approved or cleared by the [place] FDA and has been authorized for detection and/or diagnosis of SARS-CoV-2 by FDA under an Emergency Use Authorization (EUA). This EUA will remain in effect (meaning this test can be used) for the duration of the COVID-19 declaration under Section 564(b)(1) of the Act, 21 U.S.C. section 360bbb-3(b)(1), unless the authorization is terminated or revoked.  Performed at [facility], [place] [address], [place], [place] [address]   Culture, Urine     Status: Abnormal   Collection Time: 03/06/21  8:42 AM  Specimen: Urine, Random  Result Value Ref Range Status   Specimen Description URINE, RANDOM  Final   Special Requests   Final    NONE Performed at [facility], [address] [place], [place] [address]    Culture MULTIPLE SPECIES PRESENT, SUGGEST RECOLLECTION (A)  Final   Report Status 03/07/2021 FINAL  Final        Radiology Studies: MR BRAIN W CONTRAST  Result Date: 03/07/2021 CLINICAL DATA:  Seizure, abnormal neuro exam EXAM: MRI HEAD WITH CONTRAST TECHNIQUE: Multiplanar, multiecho pulse sequences of the brain and surrounding structures  were obtained with intravenous contrast. CONTRAST:  27mL GADAVIST GADOBUTROL 1 MMOL/ML IV SOLN COMPARISON:  None. FINDINGS: Brain: There is no abnormal enhancement. Stable prominence of the ventricles disproportionate to the sulci. Vascular: Partially thrombosed treated right PICA aneurysm is again noted. Skull and upper cervical spine: Unremarkable. Sinuses/Orbits: No new finding. Other: None. IMPRESSION: No abnormal parenchymal or leptomeningeal enhancement. Partially thrombosed treated right PICA aneurysm. Electronically Signed   By: Macy Mis M.D.   On: 03/07/2021 13:11        Scheduled Meds:   stroke: mapping our early stages of recovery book   Does not apply Once   atorvastatin  40 mg Oral QAC supper   enoxaparin (LOVENOX) injection  40 mg Subcutaneous Q24H   levETIRAcetam  500 mg Oral Q12H   sertraline  50 mg Oral Daily   Continuous Infusions:     LOS: 3 days     Cordelia Poche, MD [facility] 03/09/2021, 11:34 AM  If 7PM-7AM, please contact night-coverage [email or web address]

## 2021-03-09 NOTE — TOC Progression Note (Signed)
Transition of Care [facility]) - Progression Note    Patient Details  Name: Elaine Rivera MRN: [id number] Date of Birth: Dec 08, 1945  Transition of Care [facility]) CM/SW [place], [place] Phone Number: 03/09/2021, 10:25 AM  Clinical Narrative:    CSW spoke with [facility] who stated they could take pt tomorrow. Covid was requested and insurance authorization approved. CSW updated pt and daughter, both are agreeable and have no further questions. SW will continue to follow for DC needs.   Expected Discharge Plan: Skilled Nursing Facility Barriers to Discharge: Ship broker  Expected Discharge Plan and Services Expected Discharge Plan: [place]   Discharge Planning Services: CM Consult Post Acute Care Choice: [place] Living arrangements for the past 2 months: Single Family Home                                       Social Determinants of Health (SDOH) Interventions    Readmission Risk Interventions No flowsheet data found.

## 2021-03-10 DIAGNOSIS — M2550 Pain in unspecified joint: Secondary | ICD-10-CM | POA: Diagnosis not present

## 2021-03-10 DIAGNOSIS — F329 Major depressive disorder, single episode, unspecified: Secondary | ICD-10-CM | POA: Diagnosis not present

## 2021-03-10 DIAGNOSIS — Z7401 Bed confinement status: Secondary | ICD-10-CM | POA: Diagnosis not present

## 2021-03-10 DIAGNOSIS — R569 Unspecified convulsions: Secondary | ICD-10-CM | POA: Diagnosis not present

## 2021-03-10 DIAGNOSIS — Z20822 Contact with and (suspected) exposure to covid-19: Secondary | ICD-10-CM | POA: Diagnosis not present

## 2021-03-10 DIAGNOSIS — R519 Headache, unspecified: Secondary | ICD-10-CM | POA: Diagnosis not present

## 2021-03-10 DIAGNOSIS — W19XXXD Unspecified fall, subsequent encounter: Secondary | ICD-10-CM | POA: Diagnosis not present

## 2021-03-10 DIAGNOSIS — Z85840 Personal history of malignant neoplasm of eye: Secondary | ICD-10-CM | POA: Diagnosis not present

## 2021-03-10 DIAGNOSIS — R0902 Hypoxemia: Secondary | ICD-10-CM | POA: Diagnosis not present

## 2021-03-10 DIAGNOSIS — R1111 Vomiting without nausea: Secondary | ICD-10-CM | POA: Diagnosis not present

## 2021-03-10 DIAGNOSIS — E876 Hypokalemia: Secondary | ICD-10-CM | POA: Diagnosis not present

## 2021-03-10 DIAGNOSIS — I671 Cerebral aneurysm, nonruptured: Secondary | ICD-10-CM | POA: Diagnosis not present

## 2021-03-10 DIAGNOSIS — Z5181 Encounter for therapeutic drug level monitoring: Secondary | ICD-10-CM | POA: Diagnosis not present

## 2021-03-10 DIAGNOSIS — I639 Cerebral infarction, unspecified: Secondary | ICD-10-CM | POA: Diagnosis not present

## 2021-03-10 DIAGNOSIS — R5381 Other malaise: Secondary | ICD-10-CM | POA: Diagnosis not present

## 2021-03-10 DIAGNOSIS — I10 Essential (primary) hypertension: Secondary | ICD-10-CM | POA: Diagnosis not present

## 2021-03-10 DIAGNOSIS — R911 Solitary pulmonary nodule: Secondary | ICD-10-CM | POA: Diagnosis not present

## 2021-03-10 DIAGNOSIS — R110 Nausea: Secondary | ICD-10-CM | POA: Diagnosis not present

## 2021-03-10 DIAGNOSIS — F419 Anxiety disorder, unspecified: Secondary | ICD-10-CM | POA: Diagnosis not present

## 2021-03-10 DIAGNOSIS — R29810 Facial weakness: Secondary | ICD-10-CM | POA: Diagnosis not present

## 2021-03-10 DIAGNOSIS — G4489 Other headache syndrome: Secondary | ICD-10-CM | POA: Diagnosis not present

## 2021-03-10 DIAGNOSIS — F29 Unspecified psychosis not due to a substance or known physiological condition: Secondary | ICD-10-CM | POA: Diagnosis not present

## 2021-03-10 DIAGNOSIS — R479 Unspecified speech disturbances: Secondary | ICD-10-CM | POA: Diagnosis not present

## 2021-03-10 DIAGNOSIS — F32A Depression, unspecified: Secondary | ICD-10-CM | POA: Diagnosis not present

## 2021-03-10 DIAGNOSIS — Z79899 Other long term (current) drug therapy: Secondary | ICD-10-CM | POA: Diagnosis not present

## 2021-03-10 MED ORDER — ATORVASTATIN CALCIUM 40 MG PO TABS: 40.0000 mg | ORAL_TABLET | Freq: Every day | ORAL | Status: DC

## 2021-03-10 MED ORDER — TEMAZEPAM 15 MG PO CAPS: 15.0000 mg | ORAL_CAPSULE | Freq: Every evening | ORAL | 0 refills | Status: DC | PRN

## 2021-03-10 MED ORDER — ALPRAZOLAM 0.5 MG PO TABS: 0.5000 mg | ORAL_TABLET | Freq: Two times a day (BID) | ORAL | 0 refills | Status: DC | PRN

## 2021-03-10 MED ORDER — LEVETIRACETAM 500 MG PO TABS
500.0000 mg | ORAL_TABLET | Freq: Two times a day (BID) | ORAL | Status: DC
Start: 2021-03-10 — End: 2021-03-15

## 2021-03-10 MED ORDER — SENNOSIDES-DOCUSATE SODIUM 8.6-50 MG PO TABS: 1.0000 | ORAL_TABLET | Freq: Every evening | ORAL | Status: DC | PRN

## 2021-03-10 NOTE — Discharge Summary (Deleted)
Physician Discharge Summary  Elaine Rivera:[id number] DOB: 1946/06/18 DOA: 03/04/2021  PCP: Carol Ada, MD  Admit date: 03/04/2021 Discharge date: 03/10/2021  Admitted From: Home Disposition: SNF  Recommendations for Outpatient Follow-up:  Follow up with PCP in 1 week Follow up with neurology in 4 weeks Please obtain BMP/CBC in one week Please follow up on the following pending results: None   Discharge Condition: Stable CODE STATUS: Full code Diet recommendation: Heart healthy   Brief/Interim Summary:  Admission HPI written by Vianne Bulls, MD   HPI: Elaine Rivera is a 75 y.o. 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  Patient reportedly fell and hit her head on 02/28/2021 without losing consciousness at that time but has been experiencing headaches.  She then reports falling off the commode at approximately 6 PM yesterday, felt too weak to get up, but was eventually able to let a friend into her home.  Her friend noted that the patient was having recurrent episodes where she was unable to speak.  EMS was called and the patient was transported to the hospital.  Patient's daughter reported that the patient was started on Remeron approximately 3 weeks ago and has had elevated mood and has been sleeping less since then. In the ED, the patient has difficultly recalling the events from earlier today. She denies any recent fever, chills, chest pain, or palpitations. She remembers experiencing an occipital headache earlier that has since resolved.    Hospital course:  Subacute ischemic CVA Hyperlipidemia MRI significant for evidence of small subacute right cerebellar infarct. Hemoglobin A1C of 5.5%. LDL of 101. Transthoracic Echocardiogram  significant for no evidence of intracardiac embolic source. Neurology with recommendations for aspirin 81 mg in addition to statin prescription. PT/OT recommending CIR for which patient was declined. Patient discharged on aspirin 81 mg daily and Lipitor 40 mg daily.   Focal seizures Resultant aphasia. Seizures diagnosed via LTM EEG. Patient started on Keppra load followed by Keppra 500 mg BID. Continue Keppra 500 mg BID with recommendation for outpatient neurology follow-up.   Hypokalemia Improved with supplementation.   Lung nodule Incidental finding. Recommendation for dedicated CT chest for further workup which revealed two 7 mm nodules in RLL. Recommendation for non-contrast chest CT at 3-6 months then if stable, then future CT at 18-24 months (from today's scan) is considered optional for low-risk patients   Depression Anxiety Patient states she does not take Remeron anymore. Continue Zoloft and Xanax. Discontinue Remeron on discharge.   Discharge Diagnoses:  Principal Problem:   Ischemic cerebrovascular accident (CVA) ([place]) Active Problems:   Depression   HTN (hypertension)   Hypokalemia   Anxiety   Transient speech disturbance   Lung nodule   Fall   Focal seizure [facility])   CVA (cerebral vascular accident) [facility])    Discharge Instructions  Discharge Instructions     Ambulatory referral to Neurology   Complete by: As directed    An appointment is requested in approximately: 1 week      Allergies as of 03/10/2021       Reactions   Sulfa Antibiotics Other (See Comments)   Reaction:  Unknown         Medication List     STOP taking these medications    mirtazapine 15 MG tablet Commonly known  as: REMERON       TAKE these medications    acetaminophen 325 MG tablet Commonly known as: TYLENOL Take 650 mg by mouth every 6 (six) hours as needed for mild pain, fever or headache.   ALPRAZolam 0.5 MG tablet Commonly known as: XANAX Take 1 tablet (0.5 mg  total) by mouth 2 (two) times daily as needed for anxiety.   atorvastatin 40 MG tablet Commonly known as: LIPITOR Take 1 tablet (40 mg total) by mouth daily before supper.   levETIRAcetam 500 MG tablet Commonly known as: KEPPRA Take 1 tablet (500 mg total) by mouth every 12 (twelve) hours.   senna-docusate 8.6-50 MG tablet Commonly known as: Senokot-S Take 1 tablet by mouth at bedtime as needed for mild constipation.   sertraline 50 MG tablet Commonly known as: ZOLOFT Take 50 mg by mouth daily.   temazepam 15 MG capsule Commonly known as: RESTORIL Take 1-2 capsules (15-30 mg total) by mouth at bedtime as needed for sleep.        Contact information for follow-up providers     Carol Ada, MD. Schedule an appointment as soon as possible for a visit .   Specialty: Family Medicine Contact information: [phone] [place] [address] [place]. Go to .   Why: As needed, If symptoms worsen Contact information: [place] [id number] [phone]        Garvin Fila, MD. Schedule an appointment as soon as possible for a visit in 4 week(s).   Specialties: Neurology, Radiology Why: For hospital follow-up Contact information: [address] [place] [address] [phone]              Contact information for after-discharge care     Destination     [facility] Preferred SNF .   Service: Skilled Nursing Contact information: [place] [phone]                    Allergies  Allergen Reactions   Sulfa Antibiotics Other (See Comments)    Reaction:  Unknown     Consultations: Neurology   Procedures/Studies: CT Angio Head W or Wo Contrast  Result Date: 03/05/2021 CLINICAL DATA:  Initial evaluation for neuro deficit. EXAM: CT ANGIOGRAPHY HEAD AND NECK  TECHNIQUE: Multidetector CT imaging of the head and neck was performed using the standard protocol during bolus administration of intravenous contrast. Multiplanar CT image reconstructions and MIPs were obtained to evaluate the vascular anatomy. Carotid stenosis measurements (when applicable) are obtained utilizing NASCET criteria, using the distal internal carotid diameter as the denominator. CONTRAST:  151mL OMNIPAQUE IOHEXOL 350 MG/ML SOLN COMPARISON:  Prior CT from earlier the same day. FINDINGS: CTA NECK FINDINGS Aortic arch: Visualized arch normal caliber with normal 3 vessel morphology. Mild atheromatous change about the arch itself. No significant stenosis about the origin of the great vessels. Right carotid system: Right common and internal carotid arteries widely patent without stenosis, dissection or occlusion. Left carotid system: Left common and internal carotid arteries widely patent without stenosis, dissection or occlusion. Vertebral arteries: Both vertebral arteries arise from the subclavian arteries. Right vertebral artery dominant. No significant proximal subclavian artery stenosis. Vertebral arteries patent within the neck without stenosis, dissection or occlusion. Skeleton: No visible acute osseous finding. No discrete or worrisome osseous lesions. Severe thoracic scoliosis partially visualized. Other neck:  No other acute soft tissue abnormality within the neck. Probable right vocal cord paresis noted. Upper chest: Scattered atelectatic changes noted within the partially visualized left lower lobe. 7 mm nodular density partially visualized within the right lower lobe (series 1, image 1). Linear atelectasis versus scarring present at the right lung apex. Suspected hiatal hernia partially visualized. Review of the MIP images confirms the above findings CTA HEAD FINDINGS Anterior circulation: Petrous, cavernous, and supraclinoid segments widely patent without stenosis. A1 segments patent  bilaterally. Normal anterior communicating artery complex. Anterior cerebral arteries patent to their distal aspects without stenosis. No M1 stenosis or occlusion. Normal MCA bifurcations. Distal MCA branches well perfused and symmetric. Posterior circulation: Pipeline stent in place within the right V4 segment, extending from the dural reflection to the vertebrobasilar junction. Patent flow seen through the stent. 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 Residual aneurysm filling measures 6 x 7 x 6 mm, previously 6 x 6 x 6 mm on prior CTA from 10/03/2018. Hypoplastic left vertebral artery remains widely patent. Left PICA origin patent and normal. Basilar widely patent. Superior cerebellar arteries patent bilaterally. Both PCA supplied via the basilar as well as bilateral posterior communicating arteries, larger on the right. PCAs remain well perfused to their distal aspects. Venous sinuses: Patent allowing for timing of the contrast bolus. Anatomic variants: Mildly hypoplastic right P1 segment with robust right posterior communicating artery. No new aneurysm. Review of the MIP images confirms the above findings IMPRESSION: 1. Negative CTA for large vessel occlusion. 2. Sequelae of prior endovascular treatment of right PICA aneurysm with pipeline stent in place within right V4 segment, stable in position. Residual aneurysm filling measuring 6 x 7 x 6 mm, little interval changed, previously measuring 6 x 6 x 6 mm on CTA from 10/03/2018. 3. Otherwise stable and negative CTA of the head and neck. No other hemodynamically significant or correctable stenosis. 4. 7 mm right lower lobe nodule, partially visualized. Further evaluation with dedicated cross-sectional imaging of the chest suggested for further evaluation. Electronically Signed   By: Jeannine Boga M.D.   On: 03/05/2021 00:00   CT Head Wo Contrast  Result Date: 03/04/2021 CLINICAL DATA:  Dizziness, fall, head injury EXAM: CT HEAD WITHOUT CONTRAST  TECHNIQUE: Contiguous axial images were obtained from the base of the skull through the vertex without intravenous contrast. COMPARISON:  MRI 11/17/2019 FINDINGS: Brain: Right suboccipital craniotomy has been performed. A hyperdense mass is seen within the right cerebellum medullary angle measuring 1.5 x 2.1 cm at axial image # 7/2 compatible with the known PICA aneurysm and demonstrating interval growth since prior MRI examination where this measured roughly 12 mm x 18 mm. A vascular stent compatible with a pipeline stent is seen within the right V4 segment of the vertebral artery extending to the origin of the basilar artery. No evidence of acute intracranial hemorrhage or infarct. No abnormal mass effect or midline shift. No abnormal intra or extra-axial fluid collection identified. There is mild ventriculomegaly again identified which appears disproportionate to the degree of cerebral atrophy suggesting underlying communicating hydrocephalus. Mild parenchymal volume loss is again noted. Moderate periventricular white matter changes are again seen, possibly related to small vessel ischemic change. Vascular: No asymmetric hyperdense vasculature at the skull base. Skull: There is no acute fracture identified. Sinuses/Orbits: There is partial opacification of the right sphenoid sinus. Mucous retention cyst noted within the left maxillary sinus. Remaining paranasal sinuses are clear. Orbits are unremarkable. Other: Mastoid air cells and middle ear cavities are  clear. 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 The aneurysm sac appears enlarged versus remote prior examination, though intervening examines are not available to determine interval change prior to endovascular therapy. The aneurysm sac demonstrates interval increase in size since prior examination measuring 21 mm in greatest dimension. Patency of the aneurysm sac is not well assessed on this examination and would be better  assessed with MRI or formal arteriography. Stable mild ventriculomegaly disproportionate to the degree of cerebral atrophy. Clinical correlation for signs and symptoms of normal pressure hydrocephalus may be helpful. No acute intracranial hemorrhage or infarct. Stable mild to moderate senescent change. Mild paranasal sinus disease. Electronically Signed   By: Fidela Salisbury MD   On: 03/04/2021 20:31   CT Angio Neck W and/or Wo Contrast  Result Date: 03/05/2021 CLINICAL DATA:  Initial evaluation for neuro deficit. EXAM: CT ANGIOGRAPHY HEAD AND NECK TECHNIQUE: Multidetector CT imaging of the head and neck was performed using the standard protocol during bolus administration of intravenous contrast. Multiplanar CT image reconstructions and MIPs were obtained to evaluate the vascular anatomy. Carotid stenosis measurements (when applicable) are obtained utilizing NASCET criteria, using the distal internal carotid diameter as the denominator. CONTRAST:  148mL OMNIPAQUE IOHEXOL 350 MG/ML SOLN COMPARISON:  Prior CT from earlier the same day. FINDINGS: CTA NECK FINDINGS Aortic arch: Visualized arch normal caliber with normal 3 vessel morphology. Mild atheromatous change about the arch itself. No significant stenosis about the origin of the great vessels. Right carotid system: Right common and internal carotid arteries widely patent without stenosis, dissection or occlusion. Left carotid system: Left common and internal carotid arteries widely patent without stenosis, dissection or occlusion. Vertebral arteries: Both vertebral arteries arise from the subclavian arteries. Right vertebral artery dominant. No significant proximal subclavian artery stenosis. Vertebral arteries patent within the neck without stenosis, dissection or occlusion. Skeleton: No visible acute osseous finding. No discrete or worrisome osseous lesions. Severe thoracic scoliosis partially visualized. Other neck: No other acute soft tissue abnormality  within the neck. Probable right vocal cord paresis noted. Upper chest: Scattered atelectatic changes noted within the partially visualized left lower lobe. 7 mm nodular density partially visualized within the right lower lobe (series 1, image 1). Linear atelectasis versus scarring present at the right lung apex. Suspected hiatal hernia partially visualized. Review of the MIP images confirms the above findings CTA HEAD FINDINGS Anterior circulation: Petrous, cavernous, and supraclinoid segments widely patent without stenosis. A1 segments patent bilaterally. Normal anterior communicating artery complex. Anterior cerebral arteries patent to their distal aspects without stenosis. No M1 stenosis or occlusion. Normal MCA bifurcations. Distal MCA branches well perfused and symmetric. Posterior circulation: Pipeline stent in place within the right V4 segment, extending from the dural reflection to the vertebrobasilar junction. Patent flow seen through the stent. 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 Residual aneurysm filling measures 6 x 7 x 6 mm, previously 6 x 6 x 6 mm on prior CTA from 10/03/2018. Hypoplastic left vertebral artery remains widely patent. Left PICA origin patent and normal. Basilar widely patent. Superior cerebellar arteries patent bilaterally. Both PCA supplied via the basilar as well as bilateral posterior communicating arteries, larger on the right. PCAs remain well perfused to their distal aspects. Venous sinuses: Patent allowing for timing of the contrast bolus. Anatomic variants: Mildly hypoplastic right P1 segment with robust right posterior communicating artery. No new aneurysm. Review of the MIP images confirms the above findings IMPRESSION: 1. Negative CTA for large vessel occlusion. 2. Sequelae of prior  endovascular treatment of right PICA aneurysm with pipeline stent in place within right V4 segment, stable in position. Residual aneurysm filling measuring 6 x 7 x 6 mm, little interval  changed, previously measuring 6 x 6 x 6 mm on CTA from 10/03/2018. 3. Otherwise stable and negative CTA of the head and neck. No other hemodynamically significant or correctable stenosis. 4. 7 mm right lower lobe nodule, partially visualized. Further evaluation with dedicated cross-sectional imaging of the chest suggested for further evaluation. Electronically Signed   By: Jeannine Boga M.D.   On: 03/05/2021 00:00   CT CHEST WO CONTRAST  Result Date: 03/06/2021 CLINICAL DATA:  Lung nodule EXAM: CT CHEST WITHOUT CONTRAST TECHNIQUE: Multidetector CT imaging of the chest was performed following the standard protocol without IV contrast. Sagittal and coronal MPR images reconstructed from axial data set. COMPARISON:  CT angio head/neck 03/04/2021 FINDINGS: Cardiovascular: Atherosclerotic calcifications aorta and proximal great vessels. Tortuous thoracic aorta related to scoliosis. Aorta normal caliber. Heart unremarkable. No pericardial effusion. Mediastinum/Nodes: Large hiatal hernia. Esophagus unremarkable. Base of cervical region normal appearance. No thoracic adenopathy. Lungs/Pleura: Mild RIGHT apical scarring. 7 mm RIGHT lower lobe nodule image 75. Additional 7 mm subpleural nodule RIGHT lower lobe laterally image 83. Bibasilar atelectasis. Scattered infiltrate RIGHT lower lobe. Subsegmental atelectasis at medial aspect base of RIGHT middle lobe. No pleural effusion or pneumothorax. Upper Abdomen: Nonobstructing BILATERAL renal calculi. Remaining visualized upper abdomen unremarkable. Musculoskeletal: Significant thoracolumbar scoliosis and osseous demineralization. IMPRESSION: Large hiatal hernia. Bibasilar atelectasis with scattered infiltrate in RIGHT lower lobe. Nonobstructing BILATERAL renal calculi. Two 7 mm nodules in RIGHT lower lobe, nonspecific; non-contrast chest CT at 3-6 months is recommended. If the nodules are stable at time of repeat CT, then future CT at 18-24 months (from today's scan)  is considered optional for low-risk patients, but is recommended for high-risk patients. This recommendation follows the consensus statement: Guidelines for Management of Incidental Pulmonary Nodules Detected on CT Images: From the Fleischner Society 2017; Radiology 2017; 284:228-243. Aortic Atherosclerosis (ICD10-I70.0). Electronically Signed   By: Lavonia Dana M.D.   On: 03/06/2021 16:27   MR BRAIN WO CONTRAST  Result Date: 03/05/2021 CLINICAL DATA:  Initial evaluation for acute TIA. EXAM: MRI HEAD WITHOUT CONTRAST TECHNIQUE: Multiplanar, multiecho pulse sequences of the brain and surrounding structures were obtained without intravenous contrast. COMPARISON:  Prior CTs from 03/04/2021 and MRI from 11/17/2019. FINDINGS: Brain: Diffuse prominence of the CSF containing spaces compatible with generalized cerebral atrophy. Patchy and confluent T2/FLAIR hyperintensity within the periventricular and deep white matter of both cerebral hemispheres as well as the pons, most consistent with chronic microvascular ischemic disease, moderate in nature. There is a subtle 12 mm linear focus of diffusion abnormality involving the right cerebellum (series 5, image 56) associated T2/FLAIR signal abnormality without ADC correlate, suggesting a small subacute ischemic infarct. This is new as compared to previous MRI. No other diffusion abnormality to suggest acute or subacute ischemia. Gray-white matter differentiation otherwise maintained. No other areas of encephalomalacia to suggest chronic cortical infarction. No findings to suggest acute intracranial hemorrhage. No mass lesion, mass effect, or midline shift. Diffuse ventricular prominence somewhat out of proportion to cortical sulcation, similar to previous. No extra-axial fluid collection. Pituitary gland suprasellar region normal. Midline structures intact. 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 The aneurysm sac is little interval changed in overall  size measuring 17 x 12 x 12 mm (previously 17 x 11 x 11 mm). 7 mm residual flow void within the aneurysm  sac better appreciated on prior CTA. Patent flow void through the right the for pipeline stent. Hypoplastic left vertebral artery noted. Major intracranial vascular flow voids otherwise well maintained. Skull and upper cervical spine: Craniocervical junction within normal limits. Bone marrow signal intensity mildly heterogeneous but within normal limits. Prior right suboccipital craniectomy noted. No scalp soft tissue abnormality. Sinuses/Orbits: Globes and orbital soft tissues demonstrate no acute finding. Left maxillary sinus retention cyst. Scattered mucosal thickening noted within the sphenoid ethmoidal sinuses. No mastoid effusion. Inner ear structures grossly normal. Other: None. IMPRESSION: 1. 12 mm linear focus of diffusion abnormality involving the right cerebellum, suggesting a small subacute ischemic infarct. This is new as compared to previous MRI from 11/17/2019. 2. No other acute intracranial abnormality. 3. Partially thrombosed right PICA aneurysm, relatively stable in size measuring up to 17 mm with residual 7 mm internal flow void. 4. Diffuse ventricular prominence somewhat out of proportion to cortical sulcation, stable. While this finding may be related to underlying atrophy, a component of normal pressure hydrocephalus could also be considered in the correct clinical setting. 5. Underlying moderate chronic microvascular ischemic disease. 6. Prior right suboccipital craniectomy. Electronically Signed   By: Jeannine Boga M.D.   On: 03/05/2021 01:06   MR BRAIN W CONTRAST  Result Date: 03/07/2021 CLINICAL DATA:  Seizure, abnormal neuro exam EXAM: MRI HEAD WITH CONTRAST TECHNIQUE: Multiplanar, multiecho pulse sequences of the brain and surrounding structures were obtained with intravenous contrast. CONTRAST:  58mL GADAVIST GADOBUTROL 1 MMOL/ML IV SOLN COMPARISON:  None. FINDINGS: Brain:  There is no abnormal enhancement. Stable prominence of the ventricles disproportionate to the sulci. Vascular: Partially thrombosed treated right PICA aneurysm is again noted. Skull and upper cervical spine: Unremarkable. Sinuses/Orbits: No new finding. Other: None. IMPRESSION: No abnormal parenchymal or leptomeningeal enhancement. Partially thrombosed treated right PICA aneurysm. Electronically Signed   By: Macy Mis M.D.   On: 03/07/2021 13:11   EEG adult  Result Date: 03/05/2021 Lora Havens, MD     03/05/2021  5:33 PM Patient Name: LACHRISHA ZIEBARTH MRN: [id number] Epilepsy Attending: Lora Havens Referring Physician/Provider: Dr Mitzi Hansen Date: 03/05/2021 Duration: 24.09 mins Patient history: 75yo F with transient aphasia. EEG to evaluate for seizure Level of alertness: Awake AEDs during EEG study: None Technical aspects: This EEG study was done with scalp electrodes positioned according to the 10-20 International system of electrode placement. Electrical activity was acquired at a sampling rate of 500Hz  and reviewed with a high frequency filter of 70Hz  and a low frequency filter of 1Hz . EEG data were recorded continuously and digitally stored. Description: The posterior dominant rhythm consists of 9 Hz activity of moderate voltage (25-35 uV) seen predominantly in posterior head regions, symmetric and reactive to eye opening and eye closing. Event button was pressed at 1543 for speech disturbance,unable to answer questions. Concomitant EEG showed rhythmic sharply contoured 7-8hz  alpha activity arising from left centro-temporal region  which evolved into 5-6hz  theta slowing  which gradually involved all of left hemisphere consistent with focal seizure. The seizure started at 1542 and lasted about 6 minutes. Hyperventilation and photic stimulation were not performed.   ABNORMALITY - Focal seizure, left centro-temporal region IMPRESSION: This study showed showed one seizure on 03/05/2021 at 1542  arising from left centro-temporal region during which patient was unable to speak, lasted for about 6 minutes. Dr Myna Hidalgo was notified. Priyanka Barbra Sarks   Overnight EEG with video  Result Date: 03/06/2021 Lora Havens, MD  03/06/2021  1:19 PM Patient Name: LAYLAH RIGA MRN: [id number] Epilepsy Attending: Lora Havens Referring Physician/Provider: Dr Mitzi Hansen Duration: 03/05/2021 1550 to 03/06/2021 1237  Patient history: 75yo F with transient aphasia. EEG to evaluate for seizure  Level of alertness: Awake, asleep  AEDs during EEG study: LEV  Technical aspects: This EEG study was done with scalp electrodes positioned according to the 10-20 International system of electrode placement. Electrical activity was acquired at a sampling rate of 500Hz  and reviewed with a high frequency filter of 70Hz  and a low frequency filter of 1Hz . EEG data were recorded continuously and digitally stored.  Description: The posterior dominant rhythm consists of 9 Hz activity of moderate voltage (25-35 uV) seen predominantly in posterior head regions, symmetric and reactive to eye opening and eye closing.  Sleep was characterized by vertex waves, sleep spindles (12 to 14 Hz), maximal frontocentral region.  One seizure was recorded on 03/05/2021 at 1726 during which patient was sitting in bed, trying to speak but having some trouble.  Concomitant EEG showed rhythmic sharply contoured 7-8hz  alpha activity arising from left centro-temporal region  which evolved into 5-6hz  theta slowing and gradually involved all of left hemisphere The seizure lasted about 1.5 minutes. Hyperventilation and photic stimulation were not performed.    ABNORMALITY - Focal seizure, left centro-temporal region  IMPRESSION: This study showed showed one seizure on 03/05/2021 at 1726 arising from left centro-temporal region during which patient had trouble speaking, lasted for about 1.5 minutes.  Lora Havens   ECHOCARDIOGRAM COMPLETE  Result Date:  03/05/2021    ECHOCARDIOGRAM REPORT   Patient Name:   FABIANNA KEATS Date of Exam: 03/05/2021 Medical Rec #:  [id number]         Height:       59.0 in Accession #:    [id number]        Weight:       130.0 lb Date of Birth:  [birth year]         BSA:          1.536 m Patient Age:    75 years          BP:           132/72 mmHg Patient Gender: F                 HR:           64 bpm. Exam Location:  Inpatient Procedure: 2D Echo, Cardiac Doppler and Color Doppler Indications:    Stroke  History:        Patient has prior history of Echocardiogram examinations, most                 recent 02/29/2020. Risk Factors:Hypertension.  Sonographer:    Cammy Brochure Referring Phys: [id number] [place]  1. Left ventricular ejection fraction, by estimation, is 60 to 65%. The left ventricle has normal function. The left ventricle has no regional wall motion abnormalities. There is mild left ventricular hypertrophy. Left ventricular diastolic parameters are consistent with Grade I diastolic dysfunction (impaired relaxation).  2. Right ventricular systolic function is normal. The right ventricular size is normal.  3. The mitral valve is normal in structure. Mild mitral valve regurgitation. No evidence of mitral stenosis.  4. The aortic valve is normal in structure. Aortic valve regurgitation is not visualized. No aortic stenosis is present.  5. The inferior vena cava is normal in size with greater than 50% respiratory  variability, suggesting right atrial pressure of 3 mmHg. Comparison(s): No significant change from prior study. Prior images reviewed side by side. Conclusion(s)/Recommendation(s): No intracardiac source of embolism detected on this transthoracic study. A transesophageal echocardiogram is recommended to exclude cardiac source of embolism if clinically indicated. FINDINGS  Left Ventricle: Left ventricular ejection fraction, by estimation, is 60 to 65%. The left ventricle has normal function. The left ventricle  has no regional wall motion abnormalities. The left ventricular internal cavity size was normal in size. There is  mild left ventricular hypertrophy. Left ventricular diastolic parameters are consistent with Grade I diastolic dysfunction (impaired relaxation). Right Ventricle: The right ventricular size is normal. No increase in right ventricular wall thickness. Right ventricular systolic function is normal. Left Atrium: Left atrial size was normal in size. Right Atrium: Right atrial size was normal in size. Pericardium: There is no evidence of pericardial effusion. Mitral Valve: The mitral valve is normal in structure. Mild mitral valve regurgitation. No evidence of mitral valve stenosis. MV peak gradient, 9.2 mmHg. The mean mitral valve gradient is 3.0 mmHg. Tricuspid Valve: The tricuspid valve is normal in structure. Tricuspid valve regurgitation is not demonstrated. No evidence of tricuspid stenosis. Aortic Valve: The aortic valve is normal in structure. Aortic valve regurgitation is not visualized. No aortic stenosis is present. Aortic valve mean gradient measures 4.0 mmHg. Aortic valve peak gradient measures 7.1 mmHg. Aortic valve area, by VTI measures 2.41 cm. Pulmonic Valve: The pulmonic valve was normal in structure. Pulmonic valve regurgitation is not visualized. No evidence of pulmonic stenosis. Aorta: The aortic root is normal in size and structure. Venous: The inferior vena cava is normal in size with greater than 50% respiratory variability, suggesting right atrial pressure of 3 mmHg. IAS/Shunts: No atrial level shunt detected by color flow Doppler.  LEFT VENTRICLE PLAX 2D LVIDd:         3.60 cm  Diastology LVIDs:         2.50 cm  LV e' medial:    5.98 cm/s LV PW:         1.10 cm  LV E/e' medial:  16.1 LV IVS:        1.30 cm  LV e' lateral:   9.46 cm/s LVOT diam:     1.90 cm  LV E/e' lateral: 10.1 LV SV:         67 LV SV Index:   43 LVOT Area:     2.84 cm  RIGHT VENTRICLE RV Basal diam:  2.70 cm RV S  prime:     14.40 cm/s TAPSE (M-mode): 2.2 cm LEFT ATRIUM             Index       RIGHT ATRIUM          Index LA diam:        3.30 cm 2.15 cm/m  RA Area:     9.18 cm LA Vol (A2C):   36.0 ml 23.44 ml/m RA Volume:   16.20 ml 10.55 ml/m LA Vol (A4C):   38.3 ml 24.94 ml/m LA Biplane Vol: 37.3 ml 24.29 ml/m  AORTIC VALVE AV Area (Vmax):    2.26 cm AV Area (Vmean):   2.19 cm AV Area (VTI):     2.41 cm AV Vmax:           133.00 cm/s AV Vmean:          88.400 cm/s AV VTI:  0.276 m AV Peak Grad:      7.1 mmHg AV Mean Grad:      4.0 mmHg LVOT Vmax:         106.00 cm/s LVOT Vmean:        68.400 cm/s LVOT VTI:          0.235 m LVOT/AV VTI ratio: 0.85  AORTA Ao Root diam: 2.80 cm Ao Asc diam:  3.80 cm MITRAL VALVE                TRICUSPID VALVE MV Area (PHT): 3.48 cm     TR Peak grad:   26.8 mmHg MV Area VTI:   1.77 cm     TR Vmax:        259.00 cm/s MV Peak grad:  9.2 mmHg MV Mean grad:  3.0 mmHg     SHUNTS MV Vmax:       1.52 m/s     Systemic VTI:  0.24 m MV Vmean:      75.3 cm/s    Systemic Diam: 1.90 cm MV Decel Time: 218 msec MV E velocity: 96.00 cm/s MV A velocity: 138.00 cm/s MV E/A ratio:  0.70 Candee Furbish MD Electronically signed by Candee Furbish MD Signature Date/Time: 03/05/2021/2:22:34 PM    Final       Subjective: No issues overnight.  Discharge Exam: Vitals:   03/10/21 0010 03/10/21 0341  BP: 121/64 (!) 115/55  Pulse: (!) 59 (!) 59  Resp:  18  Temp: 97.8 F (36.6 C) (!) 97.5 F (36.4 C)  SpO2: 96% 96%   Vitals:   03/09/21 1617 03/09/21 1959 03/10/21 0010 03/10/21 0341  BP: 118/69 115/60 121/64 (!) 115/55  Pulse: 66 69 (!) 59 (!) 59  Resp: 20 18  18   Temp: 98.4 F (36.9 C) 98.4 F (36.9 C) 97.8 F (36.6 C) (!) 97.5 F (36.4 C)  TempSrc: Oral Oral Axillary Oral  SpO2: 97% 96% 96% 96%  Weight:      Height:        General: Pt is alert, awake, not in acute distress Neuro: moves all limbs spontaneously. No aphasia    The results of significant diagnostics from this  hospitalization (including imaging, microbiology, ancillary and laboratory) are listed below for reference.     Microbiology: Recent Results (from the past 240 hour(s))  Resp Panel by RT-PCR (Flu A&B, Covid) Nasopharyngeal Swab     Status: None   Collection Time: 03/05/21  2:13 AM   Specimen: Nasopharyngeal Swab; Nasopharyngeal(NP) swabs in vial transport medium  Result Value Ref Range Status   SARS Coronavirus 2 by RT PCR NEGATIVE NEGATIVE Final    Comment: (NOTE) SARS-CoV-2 target nucleic acids are NOT DETECTED.  The SARS-CoV-2 RNA is generally detectable in upper respiratory specimens during the acute phase of infection. The lowest concentration of SARS-CoV-2 viral copies this assay can detect is 138 copies/mL. A negative result does not preclude SARS-Cov-2 infection and should not be used as the sole basis for treatment or other patient management decisions. A negative result may occur with  improper specimen collection/handling, submission of specimen other than nasopharyngeal swab, presence of viral mutation(s) within the areas targeted by this assay, and inadequate number of viral copies(<138 copies/mL). A negative result must be combined with clinical observations, patient history, and epidemiological information. The expected result is Negative.  Fact Sheet for Patients:  EntrepreneurPulse.com.au  Fact Sheet for Healthcare Providers:  IncredibleEmployment.be  This test is no t yet approved or cleared  by the [place] and  has been authorized for detection and/or diagnosis of SARS-CoV-2 by FDA under an Emergency Use Authorization (EUA). This EUA will remain  in effect (meaning this test can be used) for the duration of the COVID-19 declaration under Section 564(b)(1) of the Act, 21 U.S.C.section 360bbb-3(b)(1), unless the authorization is terminated  or revoked sooner.       Influenza A by PCR NEGATIVE NEGATIVE Final    Influenza B by PCR NEGATIVE NEGATIVE Final    Comment: (NOTE) The Xpert Xpress SARS-CoV-2/FLU/RSV plus assay is intended as an aid in the diagnosis of influenza from Nasopharyngeal swab specimens and should not be used as a sole basis for treatment. Nasal washings and aspirates are unacceptable for Xpert Xpress SARS-CoV-2/FLU/RSV testing.  Fact Sheet for Patients: EntrepreneurPulse.com.au  Fact Sheet for Healthcare Providers: IncredibleEmployment.be  This test is not yet approved or cleared by the [place] FDA and has been authorized for detection and/or diagnosis of SARS-CoV-2 by FDA under an Emergency Use Authorization (EUA). This EUA will remain in effect (meaning this test can be used) for the duration of the COVID-19 declaration under Section 564(b)(1) of the Act, 21 U.S.C. section 360bbb-3(b)(1), unless the authorization is terminated or revoked.  Performed at [facility], [place] [address] [place], [place] [address]   Culture, Urine     Status: Abnormal   Collection Time: 03/06/21  8:42 AM   Specimen: Urine, Random  Result Value Ref Range Status   Specimen Description URINE, RANDOM  Final   Special Requests   Final    NONE Performed at [facility], [place] [address] [place], [place] [address]    Culture MULTIPLE SPECIES PRESENT, SUGGEST RECOLLECTION (A)  Final   Report Status 03/07/2021 FINAL  Final  SARS CORONAVIRUS 2 (TAT 6-24 HRS) Nasopharyngeal Nasopharyngeal Swab     Status: None   Collection Time: 03/09/21 10:12 AM   Specimen: Nasopharyngeal Swab  Result Value Ref Range Status   SARS Coronavirus 2 NEGATIVE NEGATIVE Final    Comment: (NOTE) SARS-CoV-2 target nucleic acids are NOT DETECTED.  The SARS-CoV-2 RNA is generally detectable in upper and lower respiratory specimens during the acute phase of infection. Negative results do not preclude SARS-CoV-2 infection, do not rule out co-infections  with other pathogens, and should not be used as the sole basis for treatment or other patient management decisions. Negative results must be combined with clinical observations, patient history, and epidemiological information. The expected result is Negative.  Fact Sheet for Patients: SugarRoll.be  Fact Sheet for Healthcare Providers: https://www.woods-mathews.com/  This test is not yet approved or cleared by the [place] FDA and  has been authorized for detection and/or diagnosis of SARS-CoV-2 by FDA under an Emergency Use Authorization (EUA). This EUA will remain  in effect (meaning this test can be used) for the duration of the COVID-19 declaration under Se ction 564(b)(1) of the Act, 21 U.S.C. section 360bbb-3(b)(1), unless the authorization is terminated or revoked sooner.  Performed at [facility], [place] [address], [place], [place] [address]      Labs: BNP (last 3 results) No results for input(s): BNP in the last 8760 hours. Basic Metabolic Panel: Recent Labs  Lab 03/04/21 1924 03/05/21 0400 03/06/21 0356  NA 138 139 139  K 2.8* 3.0* 3.4*  CL 106 108 110  CO2 25 23 17*  GLUCOSE 112* 94 67*  BUN 10 6* 7*  CREATININE 0.65 0.54 0.63  CALCIUM  8.3* 7.3* 6.8*  MG 2.2  --   --    Liver Function Tests: Recent Labs  Lab 03/04/21 1924  AST 40  ALT 24  ALKPHOS 80  BILITOT 0.7  PROT 6.8  ALBUMIN 3.8   No results for input(s): LIPASE, AMYLASE in the last 168 hours. No results for input(s): AMMONIA in the last 168 hours. CBC: Recent Labs  Lab 03/04/21 1924 03/06/21 0356  WBC 8.9 7.3  HGB 11.5* 11.2*  HCT 36.5 37.0  MCV 79.5* 81.5  PLT 305 274   Cardiac Enzymes: No results for input(s): CKTOTAL, CKMB, CKMBINDEX, TROPONINI in the last 168 hours. BNP: Invalid input(s): POCBNP CBG: No results for input(s): GLUCAP in the last 168 hours. D-Dimer No results for input(s): DDIMER in the last 72 hours. Hgb  A1c No results for input(s): HGBA1C in the last 72 hours. Lipid Profile No results for input(s): CHOL, HDL, LDLCALC, TRIG, CHOLHDL, LDLDIRECT in the last 72 hours. Thyroid function studies No results for input(s): TSH, T4TOTAL, T3FREE, THYROIDAB in the last 72 hours.  Invalid input(s): FREET3 Anemia work up No results for input(s): VITAMINB12, FOLATE, FERRITIN, TIBC, IRON, RETICCTPCT in the last 72 hours. Urinalysis    Component Value Date/Time   COLORURINE YELLOW 03/06/2021 0842   APPEARANCEUR CLEAR 03/06/2021 0842   LABSPEC 1.015 03/06/2021 0842   PHURINE 6.0 03/06/2021 0842   GLUCOSEU NEGATIVE 03/06/2021 0842   HGBUR SMALL (A) 03/06/2021 0842   BILIRUBINUR NEGATIVE 03/06/2021 0842   KETONESUR 80 (A) 03/06/2021 0842   PROTEINUR NEGATIVE 03/06/2021 0842   UROBILINOGEN 0.2 12/26/2019 1226   NITRITE NEGATIVE 03/06/2021 0842   LEUKOCYTESUR MODERATE (A) 03/06/2021 0842   Sepsis Labs Invalid input(s): PROCALCITONIN,  WBC,  LACTICIDVEN Microbiology Recent Results (from the past 240 hour(s))  Resp Panel by RT-PCR (Flu A&B, Covid) Nasopharyngeal Swab     Status: None   Collection Time: 03/05/21  2:13 AM   Specimen: Nasopharyngeal Swab; Nasopharyngeal(NP) swabs in vial transport medium  Result Value Ref Range Status   SARS Coronavirus 2 by RT PCR NEGATIVE NEGATIVE Final    Comment: (NOTE) SARS-CoV-2 target nucleic acids are NOT DETECTED.  The SARS-CoV-2 RNA is generally detectable in upper respiratory specimens during the acute phase of infection. The lowest concentration of SARS-CoV-2 viral copies this assay can detect is 138 copies/mL. A negative result does not preclude SARS-Cov-2 infection and should not be used as the sole basis for treatment or other patient management decisions. A negative result may occur with  improper specimen collection/handling, submission of specimen other than nasopharyngeal swab, presence of viral mutation(s) within the areas targeted by this  assay, and inadequate number of viral copies(<138 copies/mL). A negative result must be combined with clinical observations, patient history, and epidemiological information. The expected result is Negative.  Fact Sheet for Patients:  EntrepreneurPulse.com.au  Fact Sheet for Healthcare Providers:  IncredibleEmployment.be  This test is no t yet approved or cleared by the [place] FDA and  has been authorized for detection and/or diagnosis of SARS-CoV-2 by FDA under an Emergency Use Authorization (EUA). This EUA will remain  in effect (meaning this test can be used) for the duration of the COVID-19 declaration under Section 564(b)(1) of the Act, 21 U.S.C.section 360bbb-3(b)(1), unless the authorization is terminated  or revoked sooner.       Influenza A by PCR NEGATIVE NEGATIVE Final   Influenza B by PCR NEGATIVE NEGATIVE Final    Comment: (NOTE) The Xpert Xpress SARS-CoV-2/FLU/RSV plus assay is  intended as an aid in the diagnosis of influenza from Nasopharyngeal swab specimens and should not be used as a sole basis for treatment. Nasal washings and aspirates are unacceptable for Xpert Xpress SARS-CoV-2/FLU/RSV testing.  Fact Sheet for Patients: EntrepreneurPulse.com.au  Fact Sheet for Healthcare Providers: IncredibleEmployment.be  This test is not yet approved or cleared by the [place] FDA and has been authorized for detection and/or diagnosis of SARS-CoV-2 by FDA under an Emergency Use Authorization (EUA). This EUA will remain in effect (meaning this test can be used) for the duration of the COVID-19 declaration under Section 564(b)(1) of the Act, 21 U.S.C. section 360bbb-3(b)(1), unless the authorization is terminated or revoked.  Performed at [facility], [place] [address] [place], [place] [address]   Culture, Urine     Status: Abnormal   Collection Time: 03/06/21  8:42  AM   Specimen: Urine, Random  Result Value Ref Range Status   Specimen Description URINE, RANDOM  Final   Special Requests   Final    NONE Performed at [facility], [place] [address] [place], [place] [address]    Culture MULTIPLE SPECIES PRESENT, SUGGEST RECOLLECTION (A)  Final   Report Status 03/07/2021 FINAL  Final  SARS CORONAVIRUS 2 (TAT 6-24 HRS) Nasopharyngeal Nasopharyngeal Swab     Status: None   Collection Time: 03/09/21 10:12 AM   Specimen: Nasopharyngeal Swab  Result Value Ref Range Status   SARS Coronavirus 2 NEGATIVE NEGATIVE Final    Comment: (NOTE) SARS-CoV-2 target nucleic acids are NOT DETECTED.  The SARS-CoV-2 RNA is generally detectable in upper and lower respiratory specimens during the acute phase of infection. Negative results do not preclude SARS-CoV-2 infection, do not rule out co-infections with other pathogens, and should not be used as the sole basis for treatment or other patient management decisions. Negative results must be combined with clinical observations, patient history, and epidemiological information. The expected result is Negative.  Fact Sheet for Patients: SugarRoll.be  Fact Sheet for Healthcare Providers: https://www.woods-mathews.com/  This test is not yet approved or cleared by the [place] FDA and  has been authorized for detection and/or diagnosis of SARS-CoV-2 by FDA under an Emergency Use Authorization (EUA). This EUA will remain  in effect (meaning this test can be used) for the duration of the COVID-19 declaration under Se ction 564(b)(1) of the Act, 21 U.S.C. section 360bbb-3(b)(1), unless the authorization is terminated or revoked sooner.  Performed at [facility], [place] [address], [place], [place] [address]      Time coordinating discharge: 35 minutes  SIGNED:   Cordelia Poche, MD [facility] 03/10/2021, 9:58 AM

## 2021-03-10 NOTE — Care Management Important Message (Signed)
Important Message  Patient Details  Name: JONISHA KINDIG MRN: [id number] Date of Birth: 05-13-1946   Medicare Important Message Given:  Yes  Patient left prior to IM delivery  will mailed to the patient home address.    Ismahan Lippman 03/10/2021, 3:02 PM

## 2021-03-10 NOTE — TOC Transition Note (Signed)
Transition of Care [facility]) - CM/SW Discharge Note   Patient Details  Name: Elaine Rivera MRN: [id number] Date of Birth: 11-03-1945  [facility] CM/SW Contact:  Geralynn Ochs, LCSW Phone Number: 03/10/2021, 10:29 AM   Clinical Narrative:   Nurse to call report to [phone].    Final next level of care: Skilled Nursing Facility Barriers to Discharge: Barriers Resolved   Patient Goals and CMS Choice Patient states their goals for this hospitalization and ongoing recovery are:: to get rehab at [facility] Medicare.gov Compare Post Acute Care list provided to:: Patient Choice offered to / list presented to : Patient  Discharge Placement              Patient chooses bed at: [facility] Patient to be transferred to facility by: [place] Name of family member notified: Self Patient and family notified of of transfer: 03/10/21  Discharge Plan and Services   Discharge Planning Services: CM Consult Post Acute Care Choice: [place]                               Social Determinants of Health (SDOH) Interventions     Readmission Risk Interventions No flowsheet data found.

## 2021-03-13 ENCOUNTER — Observation Stay (HOSPITAL_COMMUNITY)
Admission: EM | Admit: 2021-03-13 | Discharge: 2021-03-15 | Disposition: A | Discharge status: Home / Self Care | Payer: PPO | Attending: Family Medicine | Admitting: Family Medicine

## 2021-03-13 ENCOUNTER — Emergency Department (HOSPITAL_COMMUNITY): Payer: PPO

## 2021-03-13 DIAGNOSIS — Z85840 Personal history of malignant neoplasm of eye: Secondary | ICD-10-CM | POA: Diagnosis not present

## 2021-03-13 DIAGNOSIS — I671 Cerebral aneurysm, nonruptured: Secondary | ICD-10-CM | POA: Diagnosis not present

## 2021-03-13 DIAGNOSIS — Z20822 Contact with and (suspected) exposure to covid-19: Secondary | ICD-10-CM | POA: Diagnosis not present

## 2021-03-13 DIAGNOSIS — G4489 Other headache syndrome: Secondary | ICD-10-CM | POA: Diagnosis not present

## 2021-03-13 DIAGNOSIS — R112 Nausea with vomiting, unspecified: Secondary | ICD-10-CM | POA: Diagnosis not present

## 2021-03-13 DIAGNOSIS — I10 Essential (primary) hypertension: Secondary | ICD-10-CM | POA: Insufficient documentation

## 2021-03-13 DIAGNOSIS — F32A Depression, unspecified: Secondary | ICD-10-CM | POA: Diagnosis present

## 2021-03-13 DIAGNOSIS — Z79899 Other long term (current) drug therapy: Secondary | ICD-10-CM | POA: Insufficient documentation

## 2021-03-13 DIAGNOSIS — F419 Anxiety disorder, unspecified: Secondary | ICD-10-CM | POA: Diagnosis present

## 2021-03-13 DIAGNOSIS — E876 Hypokalemia: Secondary | ICD-10-CM | POA: Diagnosis not present

## 2021-03-13 DIAGNOSIS — R1111 Vomiting without nausea: Secondary | ICD-10-CM | POA: Diagnosis not present

## 2021-03-13 DIAGNOSIS — R110 Nausea: Secondary | ICD-10-CM | POA: Diagnosis not present

## 2021-03-13 DIAGNOSIS — R29810 Facial weakness: Secondary | ICD-10-CM | POA: Diagnosis not present

## 2021-03-13 DIAGNOSIS — R519 Headache, unspecified: Secondary | ICD-10-CM | POA: Diagnosis not present

## 2021-03-13 DIAGNOSIS — R0902 Hypoxemia: Secondary | ICD-10-CM | POA: Diagnosis not present

## 2021-03-13 MED ORDER — KETOROLAC TROMETHAMINE 30 MG/ML IJ SOLN
15.0000 mg | Freq: Once | INTRAMUSCULAR | Status: AC
  Administered 2021-03-14: 15 mg via INTRAVENOUS
  Filled 2021-03-13: qty 1

## 2021-03-13 MED ORDER — METOCLOPRAMIDE HCL 5 MG/ML IJ SOLN
10.0000 mg | Freq: Once | INTRAMUSCULAR | Status: AC
  Administered 2021-03-14: 10 mg via INTRAVENOUS
  Filled 2021-03-13: qty 2

## 2021-03-13 MED ORDER — DIPHENHYDRAMINE HCL 50 MG/ML IJ SOLN
25.0000 mg | Freq: Once | INTRAMUSCULAR | Status: AC
  Administered 2021-03-14: 25 mg via INTRAVENOUS
  Filled 2021-03-13: qty 1

## 2021-03-13 NOTE — ED Triage Notes (Signed)
Pt here from [facility] for eval of HA, N/V. Hx of aneurisms, TIAs, anxiety. Staff gave xanax, tylenol prior to EMS arrival- did not help. Stroke screen clear, EKG unremarkable 114/76 68 HR 108 CBG

## 2021-03-13 NOTE — ED Provider Notes (Signed)
[facility] EMERGENCY DEPARTMENT Provider Note   CSN: [id number] Arrival date & time: 03/13/21  2118     History Chief Complaint  Patient presents with   Headache   Nausea   Emesis    Elaine Rivera is a 75 y.o. female.  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  States this feels similar to her previous aneurysm which was clipped.  She states the headache started acutely while she was lying in bed at 8 PM.  Headache is severe and constant, not relieved with Tylenol.  Did have 1 episode of vomiting.  No difficulty breathing.  No focal weakness, numbness or tingling.  No difficulty speaking or difficulty swallowing.  No chest pain or shortness of breath.  She states she normally does not have this kind of headache.  Denies any fall or trauma.  Pain radiates down the back of her neck.  No visual changes.  No weakness in her arms or legs.  No difficulty speaking.  Last time she had a headache like this states she had an aneurysm.  The history is provided by the patient.  Headache Associated symptoms: nausea and vomiting   Associated symptoms: no abdominal pain, no congestion, no cough, no dizziness, no fever, no myalgias and no weakness   Emesis Associated symptoms: headaches   Associated symptoms: no abdominal pain, no arthralgias, no cough, no fever and no myalgias       Past Medical History:  Diagnosis Date   Anemia    years ago   Arthritis    Basal cell carcinoma of eye    biopsy of left eye/ non cancerous   Complication of anesthesia    nausea/vomiting   Depression    GERD (gastroesophageal reflux disease)    History of kidney stones    Hypertension    no longer on medications    Patient Active Problem List   Diagnosis Date  Noted   Focal seizure ([place]) 03/06/2021   CVA (cerebral vascular accident) ([place]) 03/06/2021   Ischemic cerebrovascular accident (CVA) ([place]) 03/05/2021   Hypokalemia 03/05/2021   Anxiety 03/05/2021   Transient speech disturbance 03/05/2021   Lung nodule 03/05/2021   Aphasia    Fall    Anemia 02/28/2020   Shortness of breath 01/24/2020   Dizziness 01/24/2020   Herpes 10/04/2018   Facial cellulitis 10/03/2018   Brain aneurysm 09/05/2018   NSAID long-term use 04/20/2016   Scoliosis 04/20/2016   Depression 04/20/2016   HTN (hypertension) 04/20/2016   GERD (gastroesophageal reflux disease) 04/20/2016   Occult GI bleeding 04/20/2016    Past Surgical History:  Procedure Laterality Date   CEREBRAL ANEURYSM REPAIR     1998   COLONOSCOPY     ECTOPIC PREGNANCY SURGERY     x2    ENTEROSCOPY N/A 03/01/2020   Procedure: ENTEROSCOPY;  Surgeon: Carol Ada, MD;  Location: [facility];  Service: Endoscopy;  Laterality: N/A;   ESOPHAGOGASTRODUODENOSCOPY N/A 04/22/2016   Procedure: ESOPHAGOGASTRODUODENOSCOPY (EGD);  Surgeon: Juanita Craver, MD;  Location: [facility];  Service: Endoscopy;  Laterality: N/A;   IR 3D INDEPENDENT WKST  09/05/2018   IR ANGIO INTRA EXTRACRAN SEL COM CAROTID INNOMINATE BILAT MOD SED  09/05/2018   IR ANGIO VERTEBRAL SEL VERTEBRAL UNI R MOD SED  09/05/2018   IR ANGIOGRAM FOLLOW UP STUDY  09/05/2018   IR TRANSCATH/EMBOLIZ  09/05/2018   PARATHYROIDECTOMY     2002   RADIOLOGY WITH ANESTHESIA N/A 09/05/2018   Procedure: EMBOLIZATION;  Surgeon: Radiologist, Medication, MD;  Location: [facility];  Service: Radiology;  Laterality: N/A;   TONSILLECTOMY     1953   VAGINAL HYSTERECTOMY     2001   VOCAL CORD LATERALIZATION, ENDOSCOPIC APPROACH W/ MLB     2007 (@[facility] )   WRIST SURGERY     left side/2008   WRIST SURGERY     right side/ 2002     OB History   No obstetric history on file.     Family History  Family history unknown: Yes    Social History   Tobacco Use    Smoking status: Never   Smokeless tobacco: Never  Vaping Use   Vaping Use: Never used  Substance Use Topics   Alcohol use: No   Drug use: Never    Home Medications Prior to Admission medications   Medication Sig Start Date End Date Taking? Authorizing Provider  acetaminophen (TYLENOL) 325 MG tablet Take 650 mg by mouth every 6 (six) hours as needed for mild pain, fever or headache.    [provider]  ALPRAZolam Duanne Moron) 0.5 MG tablet Take 1 tablet (0.5 mg total) by mouth 2 (two) times daily as needed for anxiety. 03/10/21   Mariel Aloe, MD  atorvastatin (LIPITOR) 40 MG tablet Take 1 tablet (40 mg total) by mouth daily before supper. 03/10/21   Mariel Aloe, MD  levETIRAcetam (KEPPRA) 500 MG tablet Take 1 tablet (500 mg total) by mouth every 12 (twelve) hours. 03/10/21   Mariel Aloe, MD  senna-docusate (SENOKOT-S) 8.6-50 MG tablet Take 1 tablet by mouth at bedtime as needed for mild constipation. 03/10/21   Mariel Aloe, MD  sertraline (ZOLOFT) 50 MG tablet Take 50 mg by mouth daily. 01/17/21   [provider]  temazepam (RESTORIL) 15 MG capsule Take 1-2 capsules (15-30 mg total) by mouth at bedtime as needed for sleep. 03/10/21   Mariel Aloe, MD    Allergies    Sulfa antibiotics  Review of Systems   Review of Systems  Constitutional:  Negative for activity change, appetite change and fever.  HENT:  Negative for congestion and rhinorrhea.   Eyes:  Negative for visual disturbance.  Respiratory:  Negative for cough, chest tightness and shortness of breath.   Cardiovascular:  Negative for chest pain.  Gastrointestinal:  Positive for nausea and vomiting. Negative for abdominal pain.  Genitourinary:  Negative for dysuria and hematuria.  Musculoskeletal:  Negative for arthralgias and myalgias.  Skin:  Negative for rash.  Neurological:  Positive for headaches. Negative for dizziness, weakness and light-headedness.   all other systems are negative except as  noted in the HPI and PMH.   Physical Exam Updated Vital Signs BP 128/79   Pulse 65   Temp 98.4 F (36.9 C) (Oral)   Resp 18   SpO2 97%   Physical Exam Vitals and nursing note reviewed.  Constitutional:      General: She is not in acute distress.    Appearance: She is well-developed. She is not ill-appearing.  HENT:     Head: Normocephalic and atraumatic.     Mouth/Throat:     Pharynx: No oropharyngeal exudate.  Eyes:     Conjunctiva/sclera: Conjunctivae normal.     Pupils: Pupils are equal, round, and reactive to light.  Neck:     Comments:  No meningismus. Cardiovascular:     Rate and Rhythm: Normal rate and regular rhythm.     Heart sounds: Normal heart sounds. No murmur heard. Pulmonary:     Effort: Pulmonary effort is normal. No respiratory distress.     Breath sounds: Normal breath sounds.  Abdominal:     Palpations: Abdomen is soft.     Tenderness: There is no abdominal tenderness. There is no guarding or rebound.  Musculoskeletal:        General: No tenderness. Normal range of motion.     Cervical back: Normal range of motion and neck supple.     Comments: Severe lumbar scoliosis  Skin:    General: Skin is warm.  Neurological:     Mental Status: She is alert and oriented to person, place, and time.     Cranial Nerves: No cranial nerve deficit.     Motor: No abnormal muscle tone.     Coordination: Coordination normal.     Comments: CN 2-12 intact, no ataxia on finger to nose, no nystagmus, 5/5 strength throughout, no pronator drift,   No appreciable nystagmus.  No ataxia finger-to-nose.  No appreciable facial droop.  5/5 strength throughout.  Psychiatric:        Behavior: Behavior normal.    ED Results / Procedures / Treatments   Labs (all labs ordered are listed, but only abnormal results are displayed) Labs Reviewed  CBC WITH DIFFERENTIAL/PLATELET - Abnormal; Notable for the following components:      Result Value   Hemoglobin 11.9 (*)    MCH 25.2 (*)     RDW 17.5 (*)    All other components within normal limits  COMPREHENSIVE METABOLIC PANEL - Abnormal; Notable for the following components:   Potassium 3.3 (*)    Glucose, Bld 102 (*)    Calcium 8.8 (*)    Total Protein 6.1 (*)    Albumin 3.2 (*)    All other components within normal limits  CSF CULTURE W GRAM STAIN  RESP PANEL BY RT-PCR (FLU A&B, COVID) ARPGX2  PROTIME-INR  BRAIN NATRIURETIC PEPTIDE  CSF CELL COUNT WITH DIFFERENTIAL  CSF CELL COUNT WITH DIFFERENTIAL  PROTEIN AND GLUCOSE, CSF  TROPONIN I (HIGH SENSITIVITY)  TROPONIN I (HIGH SENSITIVITY)    EKG EKG Interpretation  Date/Time:  Friday March 14 2021 00:46:54 EDT Ventricular Rate:  61 PR Interval:  165 QRS Duration: 74 QT Interval:  421 QTC Calculation: 424 R Axis:   -41 Text Interpretation: Sinus rhythm Probable left atrial enlargement Left axis deviation No significant change was found Confirmed by Ezequiel Essex [phone]) on 03/14/2021 12:51:51 AM  Radiology CT Head Wo Contrast  Result Date: 03/13/2021 CLINICAL DATA:  Headaches, nausea and vomiting. History of aneurysm. EXAM: CT HEAD WITHOUT CONTRAST TECHNIQUE: Contiguous axial images were obtained from the base of the skull through the vertex without intravenous contrast. COMPARISON:  CT head 03/04/2021.  MRI brain 03/07/2021 FINDINGS: Brain: Mild diffuse cerebral atrophy. Ventricular dilatation is somewhat out of proportion to the degree of sulcal atrophy but unchanged, likely due to central atrophy. Low-attenuation changes in the deep white matter consistent with small vessel ischemia. No mass effect or midline shift. No abnormal extra-axial fluid collections. No acute intracranial hemorrhage. Basal cisterns are not effaced. Vascular: Hyperdense extra-axial mass lesion again demonstrated in the right cerebellopontine angle consistent with known aneurysm in without significant change. Skull: Postoperative craniectomy in the right skull base. No acute depressed  fractures identified in the skull. Sinuses/Orbits: Retention cyst in  the left maxillary antrum. Paranasal sinuses and mastoid air cells are otherwise clear. Other: No significant change since prior study. IMPRESSION: 1. No acute intracranial abnormalities. No significant change since prior study. 2. 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 3. Diffuse cerebral atrophy and small vessel ischemic changes. 4. Stable ventricular dilatation. Electronically Signed   By: Lucienne Capers M.D.   On: 03/13/2021 23:15    Procedures .Lumbar Puncture  Date/Time: 03/14/2021 2:28 AM Performed by: Ezequiel Essex, MD Authorized by: Ezequiel Essex, MD   Consent:    Consent obtained:  Written   Consent given by:  Healthcare agent   Risks, benefits, and alternatives were discussed: yes     Risks discussed:  Bleeding, headache, nerve damage, repeat procedure, pain and infection   Alternatives discussed:  No treatment Universal protocol:    Procedure explained and questions answered to patient or proxy's satisfaction: yes     Relevant documents present and verified: yes     Test results available: yes     Imaging studies available: yes     Required blood products, implants, devices, and special equipment available: yes     Immediately prior to procedure a time out was called: yes     Site/side marked: yes     Patient identity confirmed:  Hospital-assigned identification number and arm band Pre-procedure details:    Procedure purpose:  Diagnostic   Preparation: Patient was prepped and draped in usual sterile fashion   Anesthesia:    Anesthesia method:  Local infiltration   Local anesthetic:  Lidocaine 1% w/o epi Procedure details:    Lumbar space:  L4-L5 interspace   Patient position:  R lateral decubitus   Needle gauge:  22   Needle type:  Spinal needle - Quincke tip   Needle length (in):  2.5   Number of attempts:  3   Total volume (ml):  0 Post-procedure details:     Puncture site:  Adhesive bandage applied   Procedure completion:  Tolerated well, no immediate complications Comments:     Severe scoliosis, difficult to identify landmarks.   Medications Ordered in ED Medications - No data to display  ED Course  I have reviewed the triage vital signs and the nursing notes.  Pertinent labs & imaging results that were available during my care of the patient were reviewed by me and considered in my medical decision making (see chart for details).    MDM Rules/Calculators/A&P                         Patient with known history of intracranial aneurysm here with sudden onset headache at 8 PM associated with nausea and vomiting.  Head CT done within 6 hours of sudden onset headache is negative for hemorrhage.  Neurological exam is nonfocal.  Discussed with Dr. Theda Sers of neurology.  Despite negative CT scan within 6 hours of sudden onset headache, with history of aneurysm he would recommend proceeding with lumbar puncture and CTA.  Discussed with patient.  She is agreeable.  No blood thinner use.  Lumbar puncture attempted unsuccessfully.  Patient with significant and severe scoliosis making landmark identification difficult. We will plan LP under fluoroscopy in the morning  Clinic is improved some with Toradol, Reglan and Benadryl.  Neurological exam remains unchanged.  CTA pending.  Plan LP under fluoroscopy in the morning.  Unable to complete LP due to severe scoliosis.  Overnight observation discussed with Dr. Nevada Crane Final Clinical Impression(s) /  ED Diagnoses Final diagnoses:  Bad headache  Cerebral aneurysm    Rx / DC Orders ED Discharge Orders     None        Naksh Radi, Annie Main, MD 03/14/21 [phone]

## 2021-03-14 ENCOUNTER — Emergency Department (HOSPITAL_COMMUNITY): Payer: PPO

## 2021-03-14 ENCOUNTER — Other Ambulatory Visit: Payer: Self-pay | Admitting: Radiology

## 2021-03-14 ENCOUNTER — Observation Stay (HOSPITAL_COMMUNITY): Payer: PPO

## 2021-03-14 ENCOUNTER — Other Ambulatory Visit: Payer: Self-pay

## 2021-03-14 ENCOUNTER — Encounter (HOSPITAL_COMMUNITY): Payer: Self-pay | Admitting: Internal Medicine

## 2021-03-14 DIAGNOSIS — G319 Degenerative disease of nervous system, unspecified: Secondary | ICD-10-CM | POA: Diagnosis not present

## 2021-03-14 DIAGNOSIS — E785 Hyperlipidemia, unspecified: Secondary | ICD-10-CM | POA: Diagnosis not present

## 2021-03-14 DIAGNOSIS — R519 Headache, unspecified: Secondary | ICD-10-CM | POA: Diagnosis not present

## 2021-03-14 DIAGNOSIS — I671 Cerebral aneurysm, nonruptured: Secondary | ICD-10-CM

## 2021-03-14 DIAGNOSIS — J019 Acute sinusitis, unspecified: Secondary | ICD-10-CM | POA: Diagnosis not present

## 2021-03-14 DIAGNOSIS — I10 Essential (primary) hypertension: Secondary | ICD-10-CM | POA: Diagnosis not present

## 2021-03-14 DIAGNOSIS — R112 Nausea with vomiting, unspecified: Secondary | ICD-10-CM | POA: Diagnosis not present

## 2021-03-14 DIAGNOSIS — I639 Cerebral infarction, unspecified: Secondary | ICD-10-CM | POA: Diagnosis not present

## 2021-03-14 LAB — COMPREHENSIVE METABOLIC PANEL
ALT: 15 U/L (ref 0–44)
AST: 22 U/L (ref 15–41)
Albumin: 3.2 g/dL — ABNORMAL LOW (ref 3.5–5.0)
Alkaline Phosphatase: 86 U/L (ref 38–126)
Anion gap: 9 (ref 5–15)
BUN: 10 mg/dL (ref 8–23)
CO2: 24 mmol/L (ref 22–32)
Calcium: 8.8 mg/dL — ABNORMAL LOW (ref 8.9–10.3)
Chloride: 105 mmol/L (ref 98–111)
Creatinine, Ser: 0.59 mg/dL (ref 0.44–1.00)
GFR, Estimated: >60 mL/min (ref >60)
Glucose, Bld: 102 mg/dL — ABNORMAL HIGH (ref 70–99)
Potassium: 3.3 mmol/L — ABNORMAL LOW (ref 3.5–5.1)
Sodium: 138 mmol/L (ref 135–145)
Total Bilirubin: 0.7 mg/dL (ref 0.3–1.2)
Total Protein: 6.1 g/dL — ABNORMAL LOW (ref 6.5–8.1)

## 2021-03-14 LAB — CSF CELL COUNT WITH DIFFERENTIAL
RBC Count, CSF: 134 /mm3 — ABNORMAL HIGH
Tube #: 3
WBC, CSF: 0 /mm3 (ref 0–5)

## 2021-03-14 LAB — CBC
HCT: 36.5 % (ref 36.0–46.0)
Hemoglobin: 11.3 g/dL — ABNORMAL LOW (ref 12.0–15.0)
MCH: 25.2 pg — ABNORMAL LOW (ref 26.0–34.0)
MCHC: 31 g/dL (ref 30.0–36.0)
MCV: 81.5 fL (ref 80.0–100.0)
Platelets: 269 10*3/uL (ref 150–400)
RBC: 4.48 MIL/uL (ref 3.87–5.11)
RDW: 17.6 % — ABNORMAL HIGH (ref 11.5–15.5)
WBC: 4.9 10*3/uL (ref 4.0–10.5)
nRBC: 0 % (ref 0.0–0.2)

## 2021-03-14 LAB — CBC WITH DIFFERENTIAL/PLATELET
Abs Immature Granulocytes: 0.01 10*3/uL (ref 0.00–0.07)
Basophils Absolute: 0 10*3/uL (ref 0.0–0.1)
Basophils Relative: 0 %
Eosinophils Absolute: 0.2 10*3/uL (ref 0.0–0.5)
Eosinophils Relative: 3 %
HCT: 38.2 % (ref 36.0–46.0)
Hemoglobin: 11.9 g/dL — ABNORMAL LOW (ref 12.0–15.0)
Immature Granulocytes: 0 %
Lymphocytes Relative: 24 %
Lymphs Abs: 1.6 10*3/uL (ref 0.7–4.0)
MCH: 25.2 pg — ABNORMAL LOW (ref 26.0–34.0)
MCHC: 31.2 g/dL (ref 30.0–36.0)
MCV: 80.9 fL (ref 80.0–100.0)
Monocytes Absolute: 0.6 10*3/uL (ref 0.1–1.0)
Monocytes Relative: 9 %
Neutro Abs: 4.4 10*3/uL (ref 1.7–7.7)
Neutrophils Relative %: 64 %
Platelets: 301 10*3/uL (ref 150–400)
RBC: 4.72 MIL/uL (ref 3.87–5.11)
RDW: 17.5 % — ABNORMAL HIGH (ref 11.5–15.5)
WBC: 6.9 10*3/uL (ref 4.0–10.5)
nRBC: 0 % (ref 0.0–0.2)

## 2021-03-14 LAB — BASIC METABOLIC PANEL
Anion gap: 7 (ref 5–15)
BUN: 8 mg/dL (ref 8–23)
CO2: 25 mmol/L (ref 22–32)
Calcium: 8.6 mg/dL — ABNORMAL LOW (ref 8.9–10.3)
Chloride: 108 mmol/L (ref 98–111)
Creatinine, Ser: 0.57 mg/dL (ref 0.44–1.00)
GFR, Estimated: >60 mL/min (ref >60)
Glucose, Bld: 92 mg/dL (ref 70–99)
Potassium: 3.7 mmol/L (ref 3.5–5.1)
Sodium: 140 mmol/L (ref 135–145)

## 2021-03-14 LAB — GLUCOSE, CSF: Glucose, CSF: 57 mg/dL (ref 40–70)

## 2021-03-14 LAB — SURGICAL PCR SCREEN
MRSA, PCR: NEGATIVE
Staphylococcus aureus: NEGATIVE

## 2021-03-14 LAB — TROPONIN I (HIGH SENSITIVITY)
Troponin I (High Sensitivity): 4 ng/L (ref <18)
Troponin I (High Sensitivity): 4 ng/L (ref <18)

## 2021-03-14 LAB — BRAIN NATRIURETIC PEPTIDE: B Natriuretic Peptide: 30.4 pg/mL (ref 0.0–100.0)

## 2021-03-14 LAB — PROTIME-INR
INR: 1.1 (ref 0.8–1.2)
Prothrombin Time: 14.1 seconds (ref 11.4–15.2)

## 2021-03-14 LAB — MAGNESIUM: Magnesium: 2 mg/dL (ref 1.7–2.4)

## 2021-03-14 LAB — RESP PANEL BY RT-PCR (FLU A&B, COVID) ARPGX2
Influenza A by PCR: NEGATIVE
Influenza B by PCR: NEGATIVE
SARS Coronavirus 2 by RT PCR: NEGATIVE

## 2021-03-14 LAB — PROTEIN, CSF: Total  Protein, CSF: 64 mg/dL — ABNORMAL HIGH (ref 15–45)

## 2021-03-14 LAB — PHOSPHORUS: Phosphorus: 3.2 mg/dL (ref 2.5–4.6)

## 2021-03-14 MED ORDER — LIDOCAINE HCL (PF) 1 % IJ SOLN
5.0000 mL | Freq: Once | INTRAMUSCULAR | Status: AC
  Administered 2021-03-14: 2.5 mL

## 2021-03-14 MED ORDER — DEXTROSE IN LACTATED RINGERS 5 % IV SOLN: INTRAVENOUS | Status: DC

## 2021-03-14 MED ORDER — NAPHAZOLINE-GLYCERIN 0.012-0.25 % OP SOLN
1.0000 [drp] | Freq: Four times a day (QID) | OPHTHALMIC | Status: DC | PRN
  Administered 2021-03-14: 2 [drp] via OPHTHALMIC
  Filled 2021-03-14: qty 15

## 2021-03-14 MED ORDER — FENTANYL CITRATE (PF) 100 MCG/2ML IJ SOLN: 50.0000 ug | Freq: Once | INTRAMUSCULAR | Status: DC

## 2021-03-14 MED ORDER — LEVETIRACETAM 500 MG PO TABS
500.0000 mg | ORAL_TABLET | Freq: Two times a day (BID) | ORAL | Status: DC
  Administered 2021-03-14 – 2021-03-15 (×3): 500 mg via ORAL
  Filled 2021-03-14 (×3): qty 1

## 2021-03-14 MED ORDER — ASPIRIN EC 81 MG PO TBEC
81.0000 mg | DELAYED_RELEASE_TABLET | Freq: Every day | ORAL | Status: DC
  Administered 2021-03-14 – 2021-03-15 (×2): 81 mg via ORAL
  Filled 2021-03-14 (×2): qty 1

## 2021-03-14 MED ORDER — ACETAMINOPHEN 325 MG PO TABS
650.0000 mg | ORAL_TABLET | Freq: Four times a day (QID) | ORAL | Status: DC | PRN
  Administered 2021-03-14 – 2021-03-15 (×4): 650 mg via ORAL
  Filled 2021-03-14 (×4): qty 2

## 2021-03-14 MED ORDER — POTASSIUM CHLORIDE CRYS ER 20 MEQ PO TBCR
40.0000 meq | EXTENDED_RELEASE_TABLET | Freq: Once | ORAL | Status: AC
  Administered 2021-03-14: 40 meq via ORAL
  Filled 2021-03-14: qty 2

## 2021-03-14 MED ORDER — ONDANSETRON HCL 4 MG/2ML IJ SOLN
4.0000 mg | Freq: Four times a day (QID) | INTRAMUSCULAR | Status: DC | PRN
  Administered 2021-03-14: 4 mg via INTRAVENOUS
  Filled 2021-03-14: qty 2

## 2021-03-14 MED ORDER — ALPRAZOLAM 0.5 MG PO TABS
0.5000 mg | ORAL_TABLET | Freq: Two times a day (BID) | ORAL | Status: DC | PRN
  Administered 2021-03-14: 0.5 mg via ORAL
  Filled 2021-03-14: qty 2

## 2021-03-14 MED ORDER — SERTRALINE HCL 50 MG PO TABS
50.0000 mg | ORAL_TABLET | Freq: Every day | ORAL | Status: DC
  Administered 2021-03-14: 50 mg via ORAL
  Filled 2021-03-14 (×2): qty 1

## 2021-03-14 MED ORDER — ATORVASTATIN CALCIUM 40 MG PO TABS: 40.0000 mg | ORAL_TABLET | Freq: Every day | ORAL | Status: DC

## 2021-03-14 MED ORDER — WHITE PETROLATUM EX OINT
TOPICAL_OINTMENT | CUTANEOUS | Status: AC
  Filled 2021-03-14: qty 28.35

## 2021-03-14 MED ORDER — LIDOCAINE HCL (PF) 1 % IJ SOLN
30.0000 mL | Freq: Once | INTRAMUSCULAR | Status: AC
  Administered 2021-03-14: 30 mL
  Filled 2021-03-14: qty 30

## 2021-03-14 MED ORDER — IOHEXOL 350 MG/ML SOLN
50.0000 mL | Freq: Once | INTRAVENOUS | Status: AC | PRN
  Administered 2021-03-14: 50 mL via INTRAVENOUS

## 2021-03-14 MED ORDER — GADOBUTROL 1 MMOL/ML IV SOLN
8.0000 mL | Freq: Once | INTRAVENOUS | Status: AC | PRN
  Administered 2021-03-14: 8 mL via INTRAVENOUS

## 2021-03-14 NOTE — Progress Notes (Signed)
Patient brought to room via stretcher transport. Oriented to room and unit routine; patient states she is familiar; she recently discharged from the unit.  Denies any headache or nausea at this time; completing the bedrest flat orders til 4pm post lumbar puncture.

## 2021-03-14 NOTE — NC FL2 (Signed)
[place] MEDICAID FL2 LEVEL OF CARE SCREENING TOOL     IDENTIFICATION  Patient Name: Elaine Rivera Birthdate: 1945-10-24 Sex: female Admission Date (Current Location): 03/13/2021  [facility] and [place] Number:  Herbalist and Address:  [facility], [place] [address] [place], [place] [address]      Provider Number: [id number]  Attending Physician Name and Address:  Geradine Girt, DO  Relative Name and Phone Number:       Current Level of Care: Hospital Recommended Level of Care: [place] Prior Approval Number:    Date Approved/Denied:   PASRR Number: [id number]  Discharge Plan: SNF    Current Diagnoses: Patient Active Problem List   Diagnosis Date Noted   Worsening headaches 03/14/2021   Focal seizure ([place]) 03/06/2021   CVA (cerebral vascular accident) ([place]) 03/06/2021   Ischemic cerebrovascular accident (CVA) ([place]) 03/05/2021   Hypokalemia 03/05/2021   Anxiety 03/05/2021   Transient speech disturbance 03/05/2021   Lung nodule 03/05/2021   Aphasia    Fall    Anemia 02/28/2020   Shortness of breath 01/24/2020   Dizziness 01/24/2020   Herpes 10/04/2018   Facial cellulitis 10/03/2018   Brain aneurysm 09/05/2018   NSAID long-term use 04/20/2016   Scoliosis 04/20/2016   Depression 04/20/2016   HTN (hypertension) 04/20/2016   GERD (gastroesophageal reflux disease) 04/20/2016   Occult GI bleeding 04/20/2016    Orientation RESPIRATION BLADDER Height & Weight     Self, Time, Situation, Place  Normal Incontinent Weight:   Height:     BEHAVIORAL SYMPTOMS/MOOD NEUROLOGICAL BOWEL NUTRITION STATUS    Convulsions/Seizures Continent Diet (regular)  AMBULATORY STATUS COMMUNICATION OF NEEDS Skin   Limited Assist Verbally Normal                       Personal Care Assistance Level of Assistance  Bathing, Feeding, Dressing Bathing Assistance: Limited assistance Feeding assistance: Limited assistance Dressing  Assistance: Limited assistance     Functional Limitations Info  Speech     Speech Info: Impaired (dysarthria)    SPECIAL CARE FACTORS FREQUENCY  PT (By licensed PT), OT (By licensed OT)     PT Frequency: 5x/wk OT Frequency: 5x/wk            Contractures Contractures Info: Not present    Additional Factors Info  Code Status, Allergies, Psychotropic Code Status Info: Full Allergies Info: Sulfa Antibiotics Psychotropic Info: Zoloft 50mg  daily         Current Medications (03/14/2021):  This is the current hospital active medication list Current Facility-Administered Medications  Medication Dose Route Frequency Provider Last Rate Last Admin   acetaminophen (TYLENOL) tablet 650 mg  650 mg Oral Q6H PRN Kayleen Memos, DO   650 mg at 03/14/21 1009   ALPRAZolam (XANAX) tablet 0.5 mg  0.5 mg Oral BID PRN Irene Pap N, DO   0.5 mg at 03/14/21 1131   aspirin EC tablet 81 mg  81 mg Oral Daily Rosalin Hawking, MD   81 mg at 03/14/21 1008   atorvastatin (LIPITOR) tablet 40 mg  40 mg Oral QAC supper Irene Pap N, DO       fentaNYL (SUBLIMAZE) injection 50 mcg  50 mcg Intravenous Once Rancour, Annie Main, MD       levETIRAcetam (KEPPRA) tablet 500 mg  500 mg Oral BID Irene Pap N, DO   500 mg at 03/14/21 1008   naphazoline-glycerin (CLEAR EYES REDNESS) ophth solution 1-2  drop  1-2 drop Both Eyes QID PRN Eulogio Bear U, DO       ondansetron [facility]) injection 4 mg  4 mg Intravenous Q6H PRN Irene Pap N, DO   4 mg at 03/14/21 1009   sertraline (ZOLOFT) tablet 50 mg  50 mg Oral Daily Irene Pap N, DO   50 mg at 03/14/21 1008     Discharge Medications: Please see discharge summary for a list of discharge medications.  Relevant Imaging Results:  Relevant Lab Results:   Additional Information SS#: [id number]  Geralynn Ochs, LCSW

## 2021-03-14 NOTE — Progress Notes (Addendum)
Patient admitted after midnight, see H&P.  In MRI currently.  Discussed with neurology who talked with Dr. Estanislado Pandy, did not see any residue PICA aneurysm on CTA although report said decreased in size. If pt HA resolved, no angiogram needed. Will need MRI with and without, and if negative, she can be d/c'd.  Dr. Estanislado Pandy will see her next week as outpt for potential angiogram follow up.  Will follow up after MRI Eulogio Bear DO  LP done.  MRI pending results PATIENT FROM [facility] NOT HOME AS PER H&P. TOC consult placed for return

## 2021-03-14 NOTE — H&P (Signed)
History and Physical  [name] XKG:[id number] DOB: 20-Jul-1946 DOA: 03/13/2021  Referring physician: Dr. Wyvonnia Dusky, [place]  PCP: Carol Ada, MD  Outpatient Specialists: Cardiology Patient coming from: Home.   Chief Complaint: Sudden onset severe headache.  HPI: Elaine Rivera is a 75 y.o. 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  She presented at [facility] ED from home with complaints of sudden onset severe headache associated with nausea and vomiting.  States symptoms are similar to prior aneurysm.  Work-up in the ED was unrevealing.  LP was attempted by EDP but unsuccessful.  Neurology was consulted with recommendation for LP under fluoroscopy likely by IR.  TRH, hospitalist team, was asked to admit.   ED Course:  Temperature 98.6.  BP 96/60, pulse 60, respiratory 18, O2 saturation 95% on room air.  Lab studies remarkable for serum sodium 138, potassium 3.3, serum bicarb 24, glucose 119, BUN 10, creatinine 0.59, anion gap 9, BNP 8.4, troponin 4.  WBC 6.9, hemoglobin 11.9, platelet 301.  Review of Systems: Review of systems as noted in the HPI. All other systems reviewed and are negative.   Past Medical History:  Diagnosis Date   Anemia    years ago   Arthritis    Basal cell carcinoma of eye    biopsy of left eye/ non cancerous   Complication of anesthesia    nausea/vomiting   Depression    GERD (gastroesophageal reflux disease)    History of kidney stones    Hypertension    no longer on medications   Past Surgical History:  Procedure Laterality Date   CEREBRAL ANEURYSM REPAIR     1998   COLONOSCOPY     ECTOPIC PREGNANCY SURGERY     x2    ENTEROSCOPY N/A 03/01/2020   Procedure: ENTEROSCOPY;  Surgeon: Carol Ada, MD;  Location: [facility];   Service: Endoscopy;  Laterality: N/A;   ESOPHAGOGASTRODUODENOSCOPY N/A 04/22/2016   Procedure: ESOPHAGOGASTRODUODENOSCOPY (EGD);  Surgeon: Juanita Craver, MD;  Location: [facility];  Service: Endoscopy;  Laterality: N/A;   IR 3D INDEPENDENT WKST  09/05/2018   IR ANGIO INTRA EXTRACRAN SEL COM CAROTID INNOMINATE BILAT MOD SED  09/05/2018   IR ANGIO VERTEBRAL SEL VERTEBRAL UNI R MOD SED  09/05/2018   IR ANGIOGRAM FOLLOW UP STUDY  09/05/2018   IR TRANSCATH/EMBOLIZ  09/05/2018   PARATHYROIDECTOMY     2002   RADIOLOGY WITH ANESTHESIA N/A 09/05/2018   Procedure: EMBOLIZATION;  Surgeon: Radiologist, Medication, MD;  Location: [place];  Service: Radiology;  Laterality: N/A;   TONSILLECTOMY     1953   VAGINAL HYSTERECTOMY     2001   VOCAL CORD LATERALIZATION, ENDOSCOPIC APPROACH W/ MLB     2007 (@[facility] )   WRIST SURGERY     left side/2008   WRIST SURGERY     right side/ 2002    Social History:  reports that she has never smoked. She has never used smokeless tobacco. She reports that she does not drink alcohol and does not use drugs.   Allergies  Allergen Reactions   Sulfa Antibiotics Other (See Comments)    Reaction:  Unknown     Family History  Family history unknown: Yes      Prior to Admission medications   Medication Sig Start Date End Date Taking? Authorizing Provider  acetaminophen (TYLENOL) 325 MG tablet Take 650 mg by mouth every 6 (six) hours as needed for mild pain, fever or headache.    [provider]  ALPRAZolam Duanne Moron) 0.5 MG tablet Take 1 tablet (0.5 mg total) by mouth 2 (two) times daily as needed for anxiety. 03/10/21   Mariel Aloe, MD  atorvastatin (LIPITOR) 40 MG tablet Take 1 tablet (40 mg total) by mouth daily before supper. 03/10/21   Mariel Aloe, MD  levETIRAcetam (KEPPRA) 500 MG tablet Take 1 tablet (500 mg total) by mouth every 12 (twelve) hours. 03/10/21   Mariel Aloe, MD  senna-docusate (SENOKOT-S) 8.6-50 MG tablet Take 1 tablet by mouth at bedtime  as needed for mild constipation. 03/10/21   Mariel Aloe, MD  sertraline (ZOLOFT) 50 MG tablet Take 50 mg by mouth daily. 01/17/21   [provider]  temazepam (RESTORIL) 15 MG capsule Take 1-2 capsules (15-30 mg total) by mouth at bedtime as needed for sleep. 03/10/21   Mariel Aloe, MD    Physical Exam: BP 107/65   Pulse (!) 59   Temp 98.4 F (36.9 C) (Oral)   Resp (!) 25   SpO2 96%   General: 75 y.o. year-old female well developed well nourished in no acute distress.  Alert and interactive. Cardiovascular: Regular rate and rhythm with no rubs or gallops.  No thyromegaly or JVD noted.  No lower extremity edema. 2/4 pulses in all 4 extremities. Respiratory: Clear to auscultation with no wheezes or rales. Good inspiratory effort. Abdomen: Soft nontender nondistended with normal bowel sounds x4 quadrants. Muskuloskeletal: No cyanosis, clubbing or edema noted bilaterally Neuro: CN II-XII intact, strength, sensation, reflexes Skin: No ulcerative lesions noted or rashes Psychiatry: Judgement and insight appear normal. Mood is appropriate for condition and setting          Labs on Admission:  Basic Metabolic Panel: Recent Labs  Lab 03/13/21 2313  NA 138  K 3.3*  CL 105  CO2 24  GLUCOSE 102*  BUN 10  CREATININE 0.59  CALCIUM 8.8*   Liver Function Tests: Recent Labs  Lab 03/13/21 2313  AST 22  ALT 15  ALKPHOS 86  BILITOT 0.7  PROT 6.1*  ALBUMIN 3.2*   No results for input(s): LIPASE, AMYLASE in the last 168 hours. No results for input(s): AMMONIA in the last 168 hours. CBC: Recent Labs  Lab 03/13/21 2313  WBC 6.9  NEUTROABS 4.4  HGB 11.9*  HCT 38.2  MCV 80.9  PLT 301   Cardiac Enzymes: No results for input(s): CKTOTAL, CKMB, CKMBINDEX, TROPONINI in the last 168 hours.  BNP (last 3 results) Recent Labs    03/13/21 2314  BNP 30.4    ProBNP (last 3 results) No results for input(s): PROBNP in the last 8760 hours.  CBG: No results for  input(s): GLUCAP in the last 168 hours.  Radiological Exams on Admission: CT Angio Head W or Wo Contrast  Result Date: 03/14/2021 CLINICAL DATA:  Headache with nausea and vomiting and history of aneurysm EXAM: CT ANGIOGRAPHY HEAD AND NECK TECHNIQUE: Multidetector CT imaging of the head and neck was performed using the standard protocol during bolus administration of intravenous contrast. Multiplanar CT image reconstructions and MIPs were obtained to evaluate the vascular anatomy. Carotid stenosis measurements (when applicable) are obtained utilizing NASCET criteria, using the distal internal carotid diameter as the denominator. CONTRAST:  96mL OMNIPAQUE IOHEXOL 350 MG/ML SOLN COMPARISON:  03/04/2021 FINDINGS: CTA NECK FINDINGS SKELETON: There is no  bony spinal canal stenosis. No lytic or blastic lesion. OTHER NECK: Normal pharynx, larynx and major salivary glands. No cervical lymphadenopathy. Unremarkable thyroid gland. UPPER CHEST: No pneumothorax or pleural effusion. No nodules or masses. AORTIC ARCH: There is calcific atherosclerosis of the aortic arch. There is no aneurysm, dissection or hemodynamically significant stenosis of the visualized portion of the aorta. Conventional 3 vessel aortic branching pattern. The visualized proximal subclavian arteries are widely patent. RIGHT CAROTID SYSTEM: Normal without aneurysm, dissection or stenosis. LEFT CAROTID SYSTEM: Normal without aneurysm, dissection or stenosis. VERTEBRAL ARTERIES: Right dominant configuration. Both origins are clearly patent. There is no dissection, occlusion or flow-limiting stenosis to the skull base (V1-V3 segments). CTA HEAD FINDINGS POSTERIOR CIRCULATION: --Vertebral arteries: Patent stent within the right V4 segment extending proximal basilar artery. --Inferior cerebellar arteries: There is a right PICA aneurysm that measures 5 x 4 mm, previously 6 x 7 mm. --Basilar artery: Normal. --Superior cerebellar arteries: Normal. --Posterior  cerebral arteries (PCA): Normal. ANTERIOR CIRCULATION: --Intracranial internal carotid arteries: Normal. --Anterior cerebral arteries (ACA): Normal. Both A1 segments are present. Patent anterior communicating artery (a-comm). --Middle cerebral arteries (MCA): Normal. VENOUS SINUSES: As permitted by contrast timing, patent. ANATOMIC VARIANTS: None Review of the MIP images confirms the above findings. IMPRESSION: 1. No emergent large vessel occlusion or high-grade stenosis of the intracranial arteries. 2. Decreased size of right PICA aneurysm with stent in the right vertebral artery V4 segment. Aortic Atherosclerosis (ICD10-I70.0). Electronically Signed   By: Ulyses Jarred M.D.   On: 03/14/2021 03:06   CT Head Wo Contrast  Result Date: 03/13/2021 CLINICAL DATA:  Headaches, nausea and vomiting. History of aneurysm. EXAM: CT HEAD WITHOUT CONTRAST TECHNIQUE: Contiguous axial images were obtained from the base of the skull through the vertex without intravenous contrast. COMPARISON:  CT head 03/04/2021.  MRI brain 03/07/2021 FINDINGS: Brain: Mild diffuse cerebral atrophy. Ventricular dilatation is somewhat out of proportion to the degree of sulcal atrophy but unchanged, likely due to central atrophy. Low-attenuation changes in the deep white matter consistent with small vessel ischemia. No mass effect or midline shift. No abnormal extra-axial fluid collections. No acute intracranial hemorrhage. Basal cisterns are not effaced. Vascular: Hyperdense extra-axial mass lesion again demonstrated in the right cerebellopontine angle consistent with known aneurysm in without significant change. Skull: Postoperative craniectomy in the right skull base. No acute depressed fractures identified in the skull. Sinuses/Orbits: Retention cyst in the left maxillary antrum. Paranasal sinuses and mastoid air cells are otherwise clear. Other: No significant change since prior study. IMPRESSION: 1. No acute intracranial abnormalities. No  significant change since prior study. 2. 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 3. Diffuse cerebral atrophy and small vessel ischemic changes. 4. Stable ventricular dilatation. Electronically Signed   By: Lucienne Capers M.D.   On: 03/13/2021 23:15   CT Angio Neck W and/or Wo Contrast  Result Date: 03/14/2021 CLINICAL DATA:  Headache with nausea and vomiting and history of aneurysm EXAM: CT ANGIOGRAPHY HEAD AND NECK TECHNIQUE: Multidetector CT imaging of the head and neck was performed using the standard protocol during bolus administration of intravenous contrast. Multiplanar CT image reconstructions and MIPs were obtained to evaluate the vascular anatomy. Carotid stenosis measurements (when applicable) are obtained utilizing NASCET criteria, using the distal internal carotid diameter as the denominator. CONTRAST:  48mL OMNIPAQUE IOHEXOL 350 MG/ML SOLN COMPARISON:  03/04/2021 FINDINGS: CTA NECK FINDINGS SKELETON: There is no bony spinal canal stenosis. No lytic or blastic lesion. OTHER NECK: Normal pharynx, larynx and major salivary  glands. No cervical lymphadenopathy. Unremarkable thyroid gland. UPPER CHEST: No pneumothorax or pleural effusion. No nodules or masses. AORTIC ARCH: There is calcific atherosclerosis of the aortic arch. There is no aneurysm, dissection or hemodynamically significant stenosis of the visualized portion of the aorta. Conventional 3 vessel aortic branching pattern. The visualized proximal subclavian arteries are widely patent. RIGHT CAROTID SYSTEM: Normal without aneurysm, dissection or stenosis. LEFT CAROTID SYSTEM: Normal without aneurysm, dissection or stenosis. VERTEBRAL ARTERIES: Right dominant configuration. Both origins are clearly patent. There is no dissection, occlusion or flow-limiting stenosis to the skull base (V1-V3 segments). CTA HEAD FINDINGS POSTERIOR CIRCULATION: --Vertebral arteries: Patent stent within the right V4 segment extending proximal  basilar artery. --Inferior cerebellar arteries: There is a right PICA aneurysm that measures 5 x 4 mm, previously 6 x 7 mm. --Basilar artery: Normal. --Superior cerebellar arteries: Normal. --Posterior cerebral arteries (PCA): Normal. ANTERIOR CIRCULATION: --Intracranial internal carotid arteries: Normal. --Anterior cerebral arteries (ACA): Normal. Both A1 segments are present. Patent anterior communicating artery (a-comm). --Middle cerebral arteries (MCA): Normal. VENOUS SINUSES: As permitted by contrast timing, patent. ANATOMIC VARIANTS: None Review of the MIP images confirms the above findings. IMPRESSION: 1. No emergent large vessel occlusion or high-grade stenosis of the intracranial arteries. 2. Decreased size of right PICA aneurysm with stent in the right vertebral artery V4 segment. Aortic Atherosclerosis (ICD10-I70.0). Electronically Signed   By: Ulyses Jarred M.D.   On: 03/14/2021 03:06    EKG: I independently viewed the EKG done and my findings are as followed: Sinus rhythm rate of 61, nonspecific ST-T changes, QTC 424.  Assessment/Plan Present on Admission:  Worsening headaches  Active Problems:   Worsening headaches  Sudden onset severe headache, unclear etiology CT angio head with and without contrast done on 03/14/2021 revealed no emergent large vessel occlusion or high-grade stenosis of the intracranial arteries, decreased size of right PICA aneurysm with stent in the right vertebral artery V4 segment. Per EDP, neurology has recommended LP under fluoroscopy, IR consulted. N.p.o. until seen by IR. Analgesics as needed Rest of management per neurology  Hypokalemia Serum potassium 3.3 Repleted orally. Obtain magnesium level.  Chronic anxiety/depression Resume home regimen.  Seizure disorder Resume Keppra.  Hyperlipidemia Resume home Lipitor     DVT prophylaxis: SCDs.  Code Status: Full code.  Family Communication: None at bedside.  Disposition Plan: Admitted to  telemetry medical.  Consults called: Neurology consulted by EDP.  Admission status: Observation status.   Status is: Observation    Dispo: The patient is from: Home.              Anticipated d/c is to: Home.               Patient currently unstable for discharge.    Difficult to place patient, not applicable.       Kayleen Memos MD [facility] Pager [phone]  If 7PM-7AM, please contact night-coverage [email or web address] Password [facility]  03/14/2021, 4:09 AM

## 2021-03-14 NOTE — Care Management Obs Status (Signed)
[facility] NOTIFICATION   Patient Details  Name: Elaine Rivera MRN: [id number] Date of Birth: 08-06-1946   Medicare Observation Status Notification Given:  Yes    Geralynn Ochs, LCSW 03/14/2021, 3:36 PM

## 2021-03-14 NOTE — Progress Notes (Addendum)
STROKE TEAM PROGRESS NOTE   SUBJECTIVE (INTERVAL HISTORY) No family is at the bedside.  Overall her condition is rapidly improving.  Patient laying in bed, denies any headache.  Neurologically intact.  MRI with and without contrast showed no acute abnormality, no bleeding or stroke.  Patient also had LP which showed colorless, no white cells.  Discussed with Dr. 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  He will plan for routine cerebral angiogram as outpatient next week for follow-up.   OBJECTIVE Temp:  [98.4 F (36.9 C)-98.5 F (36.9 C)] 98.5 F (36.9 C) (06/17 1337) Pulse Rate:  [56-65] 58 (06/17 1337) Cardiac Rhythm: Sinus bradycardia (06/17 1354) Resp:  [14-25] 17 (06/17 1337) BP: (96-135)/(56-79) 118/74 (06/17 1337) SpO2:  [93 %-100 %] 98 % (06/17 1337)  No results for input(s): GLUCAP in the last 168 hours. Recent Labs  Lab 03/13/21 2313 03/14/21 0547  NA 138 140  K 3.3* 3.7  CL 105 108  CO2 24 25  GLUCOSE 102* 92  BUN 10 8  CREATININE 0.59 0.57  CALCIUM 8.8* 8.6*  MG  --  2.0  PHOS  --  3.2   Recent Labs  Lab 03/13/21 2313  AST 22  ALT 15  ALKPHOS 86  BILITOT 0.7  PROT 6.1*  ALBUMIN 3.2*   Recent Labs  Lab 03/13/21 2313 03/14/21 0547  WBC 6.9 4.9  NEUTROABS 4.4  --   HGB 11.9* 11.3*  HCT 38.2 36.5  MCV 80.9 81.5  PLT 301 269   No results for input(s): CKTOTAL, CKMB, CKMBINDEX, TROPONINI in the last 168 hours. Recent Labs    03/13/21 2313  LABPROT 14.1  INR 1.1   No results for input(s): COLORURINE, LABSPEC, PHURINE, GLUCOSEU, HGBUR, BILIRUBINUR, KETONESUR, PROTEINUR, UROBILINOGEN, NITRITE, LEUKOCYTESUR in the last 72 hours.  Invalid input(s): APPERANCEUR     Component Value Date/Time   CHOL 175 03/05/2021 0400   TRIG 68 03/05/2021 0400   HDL 60 03/05/2021 0400   CHOLHDL 2.9 03/05/2021 0400   VLDL 14 03/05/2021 0400   LDLCALC 101 (H) 03/05/2021 0400   Lab  Results  Component Value Date   HGBA1C 5.5 03/05/2021   No results found for: LABOPIA, COCAINSCRNUR, LABBENZ, AMPHETMU, THCU, LABBARB  No results for input(s): ETH in the last 168 hours.  I have personally reviewed the radiological images below and agree with the radiology interpretations.  CT Angio Head W or Wo Contrast  Result Date: 03/14/2021 CLINICAL DATA:  Headache with nausea and vomiting and history of aneurysm EXAM: CT ANGIOGRAPHY HEAD AND NECK TECHNIQUE: Multidetector CT imaging of the head and neck was performed using the standard protocol during bolus administration of intravenous contrast. Multiplanar CT image reconstructions and MIPs were obtained to evaluate the vascular anatomy. Carotid stenosis measurements (when applicable) are obtained utilizing NASCET criteria, using the distal internal carotid diameter as the denominator. CONTRAST:  76mL OMNIPAQUE IOHEXOL 350 MG/ML SOLN COMPARISON:  03/04/2021 FINDINGS: CTA NECK FINDINGS SKELETON: There is no bony spinal canal stenosis. No lytic or blastic lesion. OTHER NECK: Normal pharynx, larynx and major salivary glands. No cervical lymphadenopathy. Unremarkable thyroid gland. UPPER CHEST: No pneumothorax or pleural effusion. No nodules or masses. AORTIC ARCH: There is calcific atherosclerosis of the aortic arch. There is no aneurysm, dissection or hemodynamically significant stenosis of the visualized portion of the aorta. Conventional 3 vessel aortic branching pattern. The visualized proximal subclavian arteries are widely patent. RIGHT  CAROTID SYSTEM: Normal without aneurysm, dissection or stenosis. LEFT CAROTID SYSTEM: Normal without aneurysm, dissection or stenosis. VERTEBRAL ARTERIES: Right dominant configuration. Both origins are clearly patent. There is no dissection, occlusion or flow-limiting stenosis to the skull base (V1-V3 segments). CTA HEAD FINDINGS POSTERIOR CIRCULATION: --Vertebral arteries: Patent stent within the right V4 segment  extending proximal basilar artery. --Inferior cerebellar arteries: There is a right PICA aneurysm that measures 5 x 4 mm, previously 6 x 7 mm. --Basilar artery: Normal. --Superior cerebellar arteries: Normal. --Posterior cerebral arteries (PCA): Normal. ANTERIOR CIRCULATION: --Intracranial internal carotid arteries: Normal. --Anterior cerebral arteries (ACA): Normal. Both A1 segments are present. Patent anterior communicating artery (a-comm). --Middle cerebral arteries (MCA): Normal. VENOUS SINUSES: As permitted by contrast timing, patent. ANATOMIC VARIANTS: None Review of the MIP images confirms the above findings. IMPRESSION: 1. No emergent large vessel occlusion or high-grade stenosis of the intracranial arteries. 2. Decreased size of right PICA aneurysm with stent in the right vertebral artery V4 segment. Aortic Atherosclerosis (ICD10-I70.0). Electronically Signed   By: Ulyses Jarred M.D.   On: 03/14/2021 03:06   CT Angio Head W or Wo Contrast  Result Date: 03/05/2021 CLINICAL DATA:  Initial evaluation for neuro deficit. EXAM: CT ANGIOGRAPHY HEAD AND NECK TECHNIQUE: Multidetector CT imaging of the head and neck was performed using the standard protocol during bolus administration of intravenous contrast. Multiplanar CT image reconstructions and MIPs were obtained to evaluate the vascular anatomy. Carotid stenosis measurements (when applicable) are obtained utilizing NASCET criteria, using the distal internal carotid diameter as the denominator. CONTRAST:  152mL OMNIPAQUE IOHEXOL 350 MG/ML SOLN COMPARISON:  Prior CT from earlier the same day. FINDINGS: CTA NECK FINDINGS Aortic arch: Visualized arch normal caliber with normal 3 vessel morphology. Mild atheromatous change about the arch itself. No significant stenosis about the origin of the great vessels. Right carotid system: Right common and internal carotid arteries widely patent without stenosis, dissection or occlusion. Left carotid system: Left common and  internal carotid arteries widely patent without stenosis, dissection or occlusion. Vertebral arteries: Both vertebral arteries arise from the subclavian arteries. Right vertebral artery dominant. No significant proximal subclavian artery stenosis. Vertebral arteries patent within the neck without stenosis, dissection or occlusion. Skeleton: No visible acute osseous finding. No discrete or worrisome osseous lesions. Severe thoracic scoliosis partially visualized. Other neck: No other acute soft tissue abnormality within the neck. Probable right vocal cord paresis noted. Upper chest: Scattered atelectatic changes noted within the partially visualized left lower lobe. 7 mm nodular density partially visualized within the right lower lobe (series 1, image 1). Linear atelectasis versus scarring present at the right lung apex. Suspected hiatal hernia partially visualized. Review of the MIP images confirms the above findings CTA HEAD FINDINGS Anterior circulation: Petrous, cavernous, and supraclinoid segments widely patent without stenosis. A1 segments patent bilaterally. Normal anterior communicating artery complex. Anterior cerebral arteries patent to their distal aspects without stenosis. No M1 stenosis or occlusion. Normal MCA bifurcations. Distal MCA branches well perfused and symmetric. Posterior circulation: Pipeline stent in place within the right V4 segment, extending from the dural reflection to the vertebrobasilar junction. Patent flow seen through the stent. 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 Residual aneurysm filling measures 6 x 7 x 6 mm, previously 6 x 6 x 6 mm on prior CTA from 10/03/2018. Hypoplastic left vertebral artery remains widely patent. Left PICA origin patent and normal. Basilar widely patent. Superior cerebellar arteries patent bilaterally. Both PCA supplied via the basilar as well as bilateral posterior communicating  arteries, larger on the right. PCAs remain well perfused to their  distal aspects. Venous sinuses: Patent allowing for timing of the contrast bolus. Anatomic variants: Mildly hypoplastic right P1 segment with robust right posterior communicating artery. No new aneurysm. Review of the MIP images confirms the above findings IMPRESSION: 1. Negative CTA for large vessel occlusion. 2. Sequelae of prior endovascular treatment of right PICA aneurysm with pipeline stent in place within right V4 segment, stable in position. Residual aneurysm filling measuring 6 x 7 x 6 mm, little interval changed, previously measuring 6 x 6 x 6 mm on CTA from 10/03/2018. 3. Otherwise stable and negative CTA of the head and neck. No other hemodynamically significant or correctable stenosis. 4. 7 mm right lower lobe nodule, partially visualized. Further evaluation with dedicated cross-sectional imaging of the chest suggested for further evaluation. Electronically Signed   By: Jeannine Boga M.D.   On: 03/05/2021 00:00   CT Head Wo Contrast  Result Date: 03/13/2021 CLINICAL DATA:  Headaches, nausea and vomiting. History of aneurysm. EXAM: CT HEAD WITHOUT CONTRAST TECHNIQUE: Contiguous axial images were obtained from the base of the skull through the vertex without intravenous contrast. COMPARISON:  CT head 03/04/2021.  MRI brain 03/07/2021 FINDINGS: Brain: Mild diffuse cerebral atrophy. Ventricular dilatation is somewhat out of proportion to the degree of sulcal atrophy but unchanged, likely due to central atrophy. Low-attenuation changes in the deep white matter consistent with small vessel ischemia. No mass effect or midline shift. No abnormal extra-axial fluid collections. No acute intracranial hemorrhage. Basal cisterns are not effaced. Vascular: Hyperdense extra-axial mass lesion again demonstrated in the right cerebellopontine angle consistent with known aneurysm in without significant change. Skull: Postoperative craniectomy in the right skull base. No acute depressed fractures identified in  the skull. Sinuses/Orbits: Retention cyst in the left maxillary antrum. Paranasal sinuses and mastoid air cells are otherwise clear. Other: No significant change since prior study. IMPRESSION: 1. No acute intracranial abnormalities. No significant change since prior study. 2. 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 3. Diffuse cerebral atrophy and small vessel ischemic changes. 4. Stable ventricular dilatation. Electronically Signed   By: Lucienne Capers M.D.   On: 03/13/2021 23:15   CT Head Wo Contrast  Result Date: 03/04/2021 CLINICAL DATA:  Dizziness, fall, head injury EXAM: CT HEAD WITHOUT CONTRAST TECHNIQUE: Contiguous axial images were obtained from the base of the skull through the vertex without intravenous contrast. COMPARISON:  MRI 11/17/2019 FINDINGS: Brain: Right suboccipital craniotomy has been performed. A hyperdense mass is seen within the right cerebellum medullary angle measuring 1.5 x 2.1 cm at axial image # 7/2 compatible with the known PICA aneurysm and demonstrating interval growth since prior MRI examination where this measured roughly 12 mm x 18 mm. A vascular stent compatible with a pipeline stent is seen within the right V4 segment of the vertebral artery extending to the origin of the basilar artery. No evidence of acute intracranial hemorrhage or infarct. No abnormal mass effect or midline shift. No abnormal intra or extra-axial fluid collection identified. There is mild ventriculomegaly again identified which appears disproportionate to the degree of cerebral atrophy suggesting underlying communicating hydrocephalus. Mild parenchymal volume loss is again noted. Moderate periventricular white matter changes are again seen, possibly related to small vessel ischemic change. Vascular: No asymmetric hyperdense vasculature at the skull base. Skull: There is no acute fracture identified. Sinuses/Orbits: There is partial opacification of the right sphenoid sinus. Mucous  retention cyst noted within the left maxillary sinus. Remaining  paranasal sinuses are clear. Orbits are unremarkable. Other: Mastoid air cells and middle ear cavities are clear. 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 The aneurysm sac appears enlarged versus remote prior examination, though intervening examines are not available to determine interval change prior to endovascular therapy. The aneurysm sac demonstrates interval increase in size since prior examination measuring 21 mm in greatest dimension. Patency of the aneurysm sac is not well assessed on this examination and would be better assessed with MRI or formal arteriography. Stable mild ventriculomegaly disproportionate to the degree of cerebral atrophy. Clinical correlation for signs and symptoms of normal pressure hydrocephalus may be helpful. No acute intracranial hemorrhage or infarct. Stable mild to moderate senescent change. Mild paranasal sinus disease. Electronically Signed   By: Fidela Salisbury MD   On: 03/04/2021 20:31   CT Angio Neck W and/or Wo Contrast  Result Date: 03/14/2021 CLINICAL DATA:  Headache with nausea and vomiting and history of aneurysm EXAM: CT ANGIOGRAPHY HEAD AND NECK TECHNIQUE: Multidetector CT imaging of the head and neck was performed using the standard protocol during bolus administration of intravenous contrast. Multiplanar CT image reconstructions and MIPs were obtained to evaluate the vascular anatomy. Carotid stenosis measurements (when applicable) are obtained utilizing NASCET criteria, using the distal internal carotid diameter as the denominator. CONTRAST:  25mL OMNIPAQUE IOHEXOL 350 MG/ML SOLN COMPARISON:  03/04/2021 FINDINGS: CTA NECK FINDINGS SKELETON: There is no bony spinal canal stenosis. No lytic or blastic lesion. OTHER NECK: Normal pharynx, larynx and major salivary glands. No cervical lymphadenopathy. Unremarkable thyroid gland. UPPER CHEST: No pneumothorax or pleural  effusion. No nodules or masses. AORTIC ARCH: There is calcific atherosclerosis of the aortic arch. There is no aneurysm, dissection or hemodynamically significant stenosis of the visualized portion of the aorta. Conventional 3 vessel aortic branching pattern. The visualized proximal subclavian arteries are widely patent. RIGHT CAROTID SYSTEM: Normal without aneurysm, dissection or stenosis. LEFT CAROTID SYSTEM: Normal without aneurysm, dissection or stenosis. VERTEBRAL ARTERIES: Right dominant configuration. Both origins are clearly patent. There is no dissection, occlusion or flow-limiting stenosis to the skull base (V1-V3 segments). CTA HEAD FINDINGS POSTERIOR CIRCULATION: --Vertebral arteries: Patent stent within the right V4 segment extending proximal basilar artery. --Inferior cerebellar arteries: There is a right PICA aneurysm that measures 5 x 4 mm, previously 6 x 7 mm. --Basilar artery: Normal. --Superior cerebellar arteries: Normal. --Posterior cerebral arteries (PCA): Normal. ANTERIOR CIRCULATION: --Intracranial internal carotid arteries: Normal. --Anterior cerebral arteries (ACA): Normal. Both A1 segments are present. Patent anterior communicating artery (a-comm). --Middle cerebral arteries (MCA): Normal. VENOUS SINUSES: As permitted by contrast timing, patent. ANATOMIC VARIANTS: None Review of the MIP images confirms the above findings. IMPRESSION: 1. No emergent large vessel occlusion or high-grade stenosis of the intracranial arteries. 2. Decreased size of right PICA aneurysm with stent in the right vertebral artery V4 segment. Aortic Atherosclerosis (ICD10-I70.0). Electronically Signed   By: Ulyses Jarred M.D.   On: 03/14/2021 03:06   CT Angio Neck W and/or Wo Contrast  Result Date: 03/05/2021 CLINICAL DATA:  Initial evaluation for neuro deficit. EXAM: CT ANGIOGRAPHY HEAD AND NECK TECHNIQUE: Multidetector CT imaging of the head and neck was performed using the standard protocol during bolus  administration of intravenous contrast. Multiplanar CT image reconstructions and MIPs were obtained to evaluate the vascular anatomy. Carotid stenosis measurements (when applicable) are obtained utilizing NASCET criteria, using the distal internal carotid diameter as the denominator. CONTRAST:  125mL OMNIPAQUE IOHEXOL 350 MG/ML SOLN COMPARISON:  Prior CT from  earlier the same day. FINDINGS: CTA NECK FINDINGS Aortic arch: Visualized arch normal caliber with normal 3 vessel morphology. Mild atheromatous change about the arch itself. No significant stenosis about the origin of the great vessels. Right carotid system: Right common and internal carotid arteries widely patent without stenosis, dissection or occlusion. Left carotid system: Left common and internal carotid arteries widely patent without stenosis, dissection or occlusion. Vertebral arteries: Both vertebral arteries arise from the subclavian arteries. Right vertebral artery dominant. No significant proximal subclavian artery stenosis. Vertebral arteries patent within the neck without stenosis, dissection or occlusion. Skeleton: No visible acute osseous finding. No discrete or worrisome osseous lesions. Severe thoracic scoliosis partially visualized. Other neck: No other acute soft tissue abnormality within the neck. Probable right vocal cord paresis noted. Upper chest: Scattered atelectatic changes noted within the partially visualized left lower lobe. 7 mm nodular density partially visualized within the right lower lobe (series 1, image 1). Linear atelectasis versus scarring present at the right lung apex. Suspected hiatal hernia partially visualized. Review of the MIP images confirms the above findings CTA HEAD FINDINGS Anterior circulation: Petrous, cavernous, and supraclinoid segments widely patent without stenosis. A1 segments patent bilaterally. Normal anterior communicating artery complex. Anterior cerebral arteries patent to their distal aspects  without stenosis. No M1 stenosis or occlusion. Normal MCA bifurcations. Distal MCA branches well perfused and symmetric. Posterior circulation: Pipeline stent in place within the right V4 segment, extending from the dural reflection to the vertebrobasilar junction. Patent flow seen through the stent. 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 Residual aneurysm filling measures 6 x 7 x 6 mm, previously 6 x 6 x 6 mm on prior CTA from 10/03/2018. Hypoplastic left vertebral artery remains widely patent. Left PICA origin patent and normal. Basilar widely patent. Superior cerebellar arteries patent bilaterally. Both PCA supplied via the basilar as well as bilateral posterior communicating arteries, larger on the right. PCAs remain well perfused to their distal aspects. Venous sinuses: Patent allowing for timing of the contrast bolus. Anatomic variants: Mildly hypoplastic right P1 segment with robust right posterior communicating artery. No new aneurysm. Review of the MIP images confirms the above findings IMPRESSION: 1. Negative CTA for large vessel occlusion. 2. Sequelae of prior endovascular treatment of right PICA aneurysm with pipeline stent in place within right V4 segment, stable in position. Residual aneurysm filling measuring 6 x 7 x 6 mm, little interval changed, previously measuring 6 x 6 x 6 mm on CTA from 10/03/2018. 3. Otherwise stable and negative CTA of the head and neck. No other hemodynamically significant or correctable stenosis. 4. 7 mm right lower lobe nodule, partially visualized. Further evaluation with dedicated cross-sectional imaging of the chest suggested for further evaluation. Electronically Signed   By: Jeannine Boga M.D.   On: 03/05/2021 00:00   CT CHEST WO CONTRAST  Result Date: 03/06/2021 CLINICAL DATA:  Lung nodule EXAM: CT CHEST WITHOUT CONTRAST TECHNIQUE: Multidetector CT imaging of the chest was performed following the standard protocol without IV contrast. Sagittal and  coronal MPR images reconstructed from axial data set. COMPARISON:  CT angio head/neck 03/04/2021 FINDINGS: Cardiovascular: Atherosclerotic calcifications aorta and proximal great vessels. Tortuous thoracic aorta related to scoliosis. Aorta normal caliber. Heart unremarkable. No pericardial effusion. Mediastinum/Nodes: Large hiatal hernia. Esophagus unremarkable. Base of cervical region normal appearance. No thoracic adenopathy. Lungs/Pleura: Mild RIGHT apical scarring. 7 mm RIGHT lower lobe nodule image 75. Additional 7 mm subpleural nodule RIGHT lower lobe laterally image 83. Bibasilar atelectasis. Scattered infiltrate RIGHT lower lobe.  Subsegmental atelectasis at medial aspect base of RIGHT middle lobe. No pleural effusion or pneumothorax. Upper Abdomen: Nonobstructing BILATERAL renal calculi. Remaining visualized upper abdomen unremarkable. Musculoskeletal: Significant thoracolumbar scoliosis and osseous demineralization. IMPRESSION: Large hiatal hernia. Bibasilar atelectasis with scattered infiltrate in RIGHT lower lobe. Nonobstructing BILATERAL renal calculi. Two 7 mm nodules in RIGHT lower lobe, nonspecific; non-contrast chest CT at 3-6 months is recommended. If the nodules are stable at time of repeat CT, then future CT at 18-24 months (from today's scan) is considered optional for low-risk patients, but is recommended for high-risk patients. This recommendation follows the consensus statement: Guidelines for Management of Incidental Pulmonary Nodules Detected on CT Images: From the Fleischner Society 2017; Radiology 2017; 284:228-243. Aortic Atherosclerosis (ICD10-I70.0). Electronically Signed   By: Lavonia Dana M.D.   On: 03/06/2021 16:27   MR BRAIN WO CONTRAST  Result Date: 03/05/2021 CLINICAL DATA:  Initial evaluation for acute TIA. EXAM: MRI HEAD WITHOUT CONTRAST TECHNIQUE: Multiplanar, multiecho pulse sequences of the brain and surrounding structures were obtained without intravenous contrast.  COMPARISON:  Prior CTs from 03/04/2021 and MRI from 11/17/2019. FINDINGS: Brain: Diffuse prominence of the CSF containing spaces compatible with generalized cerebral atrophy. Patchy and confluent T2/FLAIR hyperintensity within the periventricular and deep white matter of both cerebral hemispheres as well as the pons, most consistent with chronic microvascular ischemic disease, moderate in nature. There is a subtle 12 mm linear focus of diffusion abnormality involving the right cerebellum (series 5, image 56) associated T2/FLAIR signal abnormality without ADC correlate, suggesting a small subacute ischemic infarct. This is new as compared to previous MRI. No other diffusion abnormality to suggest acute or subacute ischemia. Gray-white matter differentiation otherwise maintained. No other areas of encephalomalacia to suggest chronic cortical infarction. No findings to suggest acute intracranial hemorrhage. No mass lesion, mass effect, or midline shift. Diffuse ventricular prominence somewhat out of proportion to cortical sulcation, similar to previous. No extra-axial fluid collection. Pituitary gland suprasellar region normal. Midline structures intact. 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 The aneurysm sac is little interval changed in overall size measuring 17 x 12 x 12 mm (previously 17 x 11 x 11 mm). 7 mm residual flow void within the aneurysm sac better appreciated on prior CTA. Patent flow void through the right the for pipeline stent. Hypoplastic left vertebral artery noted. Major intracranial vascular flow voids otherwise well maintained. Skull and upper cervical spine: Craniocervical junction within normal limits. Bone marrow signal intensity mildly heterogeneous but within normal limits. Prior right suboccipital craniectomy noted. No scalp soft tissue abnormality. Sinuses/Orbits: Globes and orbital soft tissues demonstrate no acute finding. Left maxillary sinus retention cyst.  Scattered mucosal thickening noted within the sphenoid ethmoidal sinuses. No mastoid effusion. Inner ear structures grossly normal. Other: None. IMPRESSION: 1. 12 mm linear focus of diffusion abnormality involving the right cerebellum, suggesting a small subacute ischemic infarct. This is new as compared to previous MRI from 11/17/2019. 2. No other acute intracranial abnormality. 3. Partially thrombosed right PICA aneurysm, relatively stable in size measuring up to 17 mm with residual 7 mm internal flow void. 4. Diffuse ventricular prominence somewhat out of proportion to cortical sulcation, stable. While this finding may be related to underlying atrophy, a component of normal pressure hydrocephalus could also be considered in the correct clinical setting. 5. Underlying moderate chronic microvascular ischemic disease. 6. Prior right suboccipital craniectomy. Electronically Signed   By: Jeannine Boga M.D.   On: 03/05/2021 01:06   MR BRAIN W CONTRAST  Result Date: 03/07/2021 CLINICAL DATA:  Seizure, abnormal neuro exam EXAM: MRI HEAD WITH CONTRAST TECHNIQUE: Multiplanar, multiecho pulse sequences of the brain and surrounding structures were obtained with intravenous contrast. CONTRAST:  71mL GADAVIST GADOBUTROL 1 MMOL/ML IV SOLN COMPARISON:  None. FINDINGS: Brain: There is no abnormal enhancement. Stable prominence of the ventricles disproportionate to the sulci. Vascular: Partially thrombosed treated right PICA aneurysm is again noted. Skull and upper cervical spine: Unremarkable. Sinuses/Orbits: No new finding. Other: None. IMPRESSION: No abnormal parenchymal or leptomeningeal enhancement. Partially thrombosed treated right PICA aneurysm. Electronically Signed   By: Macy Mis M.D.   On: 03/07/2021 13:11   MR BRAIN W WO CONTRAST  Result Date: 03/14/2021 CLINICAL DATA:  Stroke follow-up. EXAM: MRI HEAD WITHOUT AND WITH CONTRAST TECHNIQUE: Multiplanar, multiecho pulse sequences of the brain and  surrounding structures were obtained without and with intravenous contrast. CONTRAST:  84mL GADAVIST GADOBUTROL 1 MMOL/ML IV SOLN COMPARISON:  CT March 13, 2021.  MRI March 06, 2019. FINDINGS: Brain: No acute infarct. No evidence of acute hemorrhage. Similar atrophy.Similar ventriculomegaly. No abnormal mass effect. Similar moderate patchy white matter T2/FLAIR hyperintensities, likely related to chronic microvascular ischemic disease. No abnormal enhancement outside of the known aneurysm. Vascular: Partially thrombosed right PICA aneurysm with stent, better characterized on same dayCTA. Skull and upper cervical spine: Prior right suboccipital craniectomy. Otherwise, normal marrow signal. Sinuses/Orbits: Partially imaged inferior left maxillary sinus retention cyst. Mucosal thickening of bilateral sphenoid sinuses and scattered ethmoid air cells with bilateral sphenoid sinus air-fluid levels. Other: No sizable mastoid effusions. IMPRESSION: 1. No evidence of acute intracranial abnormality. 2. Similar ventriculomegaly, which could relate to central predominant atrophy versus normal pressure hydrocephalus in the correct clinical setting. 3. Partially thrombosed right PICA aneurysm with stent, better characterized on same dayCTA. 4. Moderate chronic microvascular ischemic disease. 5. Paranasal sinus disease, as detailed above. Electronically Signed   By: Margaretha Sheffield MD   On: 03/14/2021 13:24   EEG adult  Result Date: 03/05/2021 Lora Havens, MD     03/05/2021  5:33 PM Patient Name: JEANANNE BEDWELL MRN: [id number] Epilepsy Attending: Lora Havens Referring Physician/Provider: Dr Mitzi Hansen Date: 03/05/2021 Duration: 24.09 mins Patient history: 75yo F with transient aphasia. EEG to evaluate for seizure Level of alertness: Awake AEDs during EEG study: None Technical aspects: This EEG study was done with scalp electrodes positioned according to the 10-20 International system of electrode placement. Electrical  activity was acquired at a sampling rate of 500Hz  and reviewed with a high frequency filter of 70Hz  and a low frequency filter of 1Hz . EEG data were recorded continuously and digitally stored. Description: The posterior dominant rhythm consists of 9 Hz activity of moderate voltage (25-35 uV) seen predominantly in posterior head regions, symmetric and reactive to eye opening and eye closing. Event button was pressed at 1543 for speech disturbance,unable to answer questions. Concomitant EEG showed rhythmic sharply contoured 7-8hz  alpha activity arising from left centro-temporal region  which evolved into 5-6hz  theta slowing  which gradually involved all of left hemisphere consistent with focal seizure. The seizure started at 1542 and lasted about 6 minutes. Hyperventilation and photic stimulation were not performed.   ABNORMALITY - Focal seizure, left centro-temporal region IMPRESSION: This study showed showed one seizure on 03/05/2021 at 1542 arising from left centro-temporal region during which patient was unable to speak, lasted for about 6 minutes. Dr Myna Hidalgo was notified. Priyanka O Yadav   Overnight EEG with video  Result Date: 03/06/2021 Lora Havens,  MD     03/06/2021  1:19 PM Patient Name: MYESHA STILLION MRN: [id number] Epilepsy Attending: Lora Havens Referring Physician/Provider: Dr Mitzi Hansen Duration: 03/05/2021 1550 to 03/06/2021 1237  Patient history: 75yo F with transient aphasia. EEG to evaluate for seizure  Level of alertness: Awake, asleep  AEDs during EEG study: LEV  Technical aspects: This EEG study was done with scalp electrodes positioned according to the 10-20 International system of electrode placement. Electrical activity was acquired at a sampling rate of 500Hz  and reviewed with a high frequency filter of 70Hz  and a low frequency filter of 1Hz . EEG data were recorded continuously and digitally stored.  Description: The posterior dominant rhythm consists of 9 Hz activity of moderate  voltage (25-35 uV) seen predominantly in posterior head regions, symmetric and reactive to eye opening and eye closing.  Sleep was characterized by vertex waves, sleep spindles (12 to 14 Hz), maximal frontocentral region.  One seizure was recorded on 03/05/2021 at 1726 during which patient was sitting in bed, trying to speak but having some trouble.  Concomitant EEG showed rhythmic sharply contoured 7-8hz  alpha activity arising from left centro-temporal region  which evolved into 5-6hz  theta slowing and gradually involved all of left hemisphere The seizure lasted about 1.5 minutes. Hyperventilation and photic stimulation were not performed.    ABNORMALITY - Focal seizure, left centro-temporal region  IMPRESSION: This study showed showed one seizure on 03/05/2021 at 1726 arising from left centro-temporal region during which patient had trouble speaking, lasted for about 1.5 minutes.  Lora Havens   ECHOCARDIOGRAM COMPLETE  Result Date: 03/05/2021    ECHOCARDIOGRAM REPORT   Patient Name:   MURREL FREET Date of Exam: 03/05/2021 Medical Rec #:  [id number]         Height:       59.0 in Accession #:    [id number]        Weight:       130.0 lb Date of Birth:  Apr 30, 1946         BSA:          1.536 m Patient Age:    74 years          BP:           132/72 mmHg Patient Gender: F                 HR:           64 bpm. Exam Location:  Inpatient Procedure: 2D Echo, Cardiac Doppler and Color Doppler Indications:    Stroke  History:        Patient has prior history of Echocardiogram examinations, most                 recent 02/29/2020. Risk Factors:Hypertension.  Sonographer:    Cammy Brochure Referring Phys: [id number] [place]  1. Left ventricular ejection fraction, by estimation, is 60 to 65%. The left ventricle has normal function. The left ventricle has no regional wall motion abnormalities. There is mild left ventricular hypertrophy. Left ventricular diastolic parameters are consistent with Grade I  diastolic dysfunction (impaired relaxation).  2. Right ventricular systolic function is normal. The right ventricular size is normal.  3. The mitral valve is normal in structure. Mild mitral valve regurgitation. No evidence of mitral stenosis.  4. The aortic valve is normal in structure. Aortic valve regurgitation is not visualized. No aortic stenosis is present.  5. The inferior vena cava is normal in  size with greater than 50% respiratory variability, suggesting right atrial pressure of 3 mmHg. Comparison(s): No significant change from prior study. Prior images reviewed side by side. Conclusion(s)/Recommendation(s): No intracardiac source of embolism detected on this transthoracic study. A transesophageal echocardiogram is recommended to exclude cardiac source of embolism if clinically indicated. FINDINGS  Left Ventricle: Left ventricular ejection fraction, by estimation, is 60 to 65%. The left ventricle has normal function. The left ventricle has no regional wall motion abnormalities. The left ventricular internal cavity size was normal in size. There is  mild left ventricular hypertrophy. Left ventricular diastolic parameters are consistent with Grade I diastolic dysfunction (impaired relaxation). Right Ventricle: The right ventricular size is normal. No increase in right ventricular wall thickness. Right ventricular systolic function is normal. Left Atrium: Left atrial size was normal in size. Right Atrium: Right atrial size was normal in size. Pericardium: There is no evidence of pericardial effusion. Mitral Valve: The mitral valve is normal in structure. Mild mitral valve regurgitation. No evidence of mitral valve stenosis. MV peak gradient, 9.2 mmHg. The mean mitral valve gradient is 3.0 mmHg. Tricuspid Valve: The tricuspid valve is normal in structure. Tricuspid valve regurgitation is not demonstrated. No evidence of tricuspid stenosis. Aortic Valve: The aortic valve is normal in structure. Aortic valve  regurgitation is not visualized. No aortic stenosis is present. Aortic valve mean gradient measures 4.0 mmHg. Aortic valve peak gradient measures 7.1 mmHg. Aortic valve area, by VTI measures 2.41 cm. Pulmonic Valve: The pulmonic valve was normal in structure. Pulmonic valve regurgitation is not visualized. No evidence of pulmonic stenosis. Aorta: The aortic root is normal in size and structure. Venous: The inferior vena cava is normal in size with greater than 50% respiratory variability, suggesting right atrial pressure of 3 mmHg. IAS/Shunts: No atrial level shunt detected by color flow Doppler.  LEFT VENTRICLE PLAX 2D LVIDd:         3.60 cm  Diastology LVIDs:         2.50 cm  LV e' medial:    5.98 cm/s LV PW:         1.10 cm  LV E/e' medial:  16.1 LV IVS:        1.30 cm  LV e' lateral:   9.46 cm/s LVOT diam:     1.90 cm  LV E/e' lateral: 10.1 LV SV:         67 LV SV Index:   43 LVOT Area:     2.84 cm  RIGHT VENTRICLE RV Basal diam:  2.70 cm RV S prime:     14.40 cm/s TAPSE (M-mode): 2.2 cm LEFT ATRIUM             Index       RIGHT ATRIUM          Index LA diam:        3.30 cm 2.15 cm/m  RA Area:     9.18 cm LA Vol (A2C):   36.0 ml 23.44 ml/m RA Volume:   16.20 ml 10.55 ml/m LA Vol (A4C):   38.3 ml 24.94 ml/m LA Biplane Vol: 37.3 ml 24.29 ml/m  AORTIC VALVE AV Area (Vmax):    2.26 cm AV Area (Vmean):   2.19 cm AV Area (VTI):     2.41 cm AV Vmax:           133.00 cm/s AV Vmean:          88.400 cm/s AV VTI:  0.276 m AV Peak Grad:      7.1 mmHg AV Mean Grad:      4.0 mmHg LVOT Vmax:         106.00 cm/s LVOT Vmean:        68.400 cm/s LVOT VTI:          0.235 m LVOT/AV VTI ratio: 0.85  AORTA Ao Root diam: 2.80 cm Ao Asc diam:  3.80 cm MITRAL VALVE                TRICUSPID VALVE MV Area (PHT): 3.48 cm     TR Peak grad:   26.8 mmHg MV Area VTI:   1.77 cm     TR Vmax:        259.00 cm/s MV Peak grad:  9.2 mmHg MV Mean grad:  3.0 mmHg     SHUNTS MV Vmax:       1.52 m/s     Systemic VTI:  0.24 m MV  Vmean:      75.3 cm/s    Systemic Diam: 1.90 cm MV Decel Time: 218 msec MV E velocity: 96.00 cm/s MV A velocity: 138.00 cm/s MV E/A ratio:  0.70 Candee Furbish MD Electronically signed by Candee Furbish MD Signature Date/Time: 03/05/2021/2:22:34 PM    Final    DG FL GUIDED LUMBAR PUNCTURE  Result Date: 03/14/2021 CLINICAL DATA:  Headache with nausea and vomiting. History of aneurysm. Failed bedside attempt at lumbar puncture. EXAM: DIAGNOSTIC LUMBAR PUNCTURE UNDER FLUOROSCOPIC GUIDANCE COMPARISON:  Lumbar spine radiographs 06/13/2020. Abdominal CT 03/15/2012. FLUOROSCOPY TIME:  Fluoroscopy Time: 12 seconds of low-dose pulsed fluoroscopy Radiation Exposure Index (if provided by the fluoroscopic device): 3.7 mGy Number of Acquired Spot Images: 1 PROCEDURE: Standard time-out was employed. I discussed the risks (including hemorrhage, infection, headache, and nerve damage, among others), benefits, and alternatives to fluoroscopically guided lumbar puncture with the patient. We specifically discussed the high technical likelihood of success of the procedure. The patient understood and elected to undergo the procedure. An appropriate site for lumbar puncture was marked on the patient's skin under fluoroscopic guidance. Following sterile skin prep and local anesthetic administration consisting of 1 percent lidocaine, a 20 gauge spinal needle was advanced without difficulty into the thecal sac at the at the L3-4 level under fluoroscopic guidance. Clear CSF was returned. There is limited return of CSF with the patient prone. The head of the bed was elevated, resulting in adequate return CSF. Opening pressure was not measured. 10 cc of clear CSF was collected. The needle was subsequently removed and the skin cleansed and bandaged. No immediate complications were observed. IMPRESSION: Diagnostic lumbar puncture performed without immediate complication. CSF was sent for the requested studies. Electronically Signed   By: Richardean Sale M.D.   On: 03/14/2021 12:47     PHYSICAL EXAM  Temp:  [98.4 F (36.9 C)-98.5 F (36.9 C)] 98.5 F (36.9 C) (06/17 1337) Pulse Rate:  [56-65] 58 (06/17 1337) Resp:  [14-25] 17 (06/17 1337) BP: (96-135)/(56-79) 118/74 (06/17 1337) SpO2:  [93 %-100 %] 98 % (06/17 1337)  General - Well nourished, well developed, in no apparent distress.  Ophthalmologic - fundi not visualized due to noncooperation.  Cardiovascular - Regular rhythm and rate.  Mental Status -  Level of arousal and orientation to time, place, and person were intact. Language including expression, naming, repetition, comprehension was assessed and found intact. Fund of Knowledge was assessed and was intact.  Cranial Nerves II - XII - II -  Visual field intact OU. III, IV, VI - Extraocular movements intact. V - Facial sensation intact bilaterally. VII - Facial movement intact bilaterally. VIII - Hearing & vestibular intact bilaterally. X - Palate elevates symmetrically. XI - Chin turning & shoulder shrug intact bilaterally. XII - Tongue protrusion intact.  Motor Strength - The patient's strength was symmetrical in all extremities and pronator drift was absent.  Bulk was normal and fasciculations were absent.   Motor Tone - Muscle tone was assessed at the neck and appendages and was normal.  Reflexes - The patient's reflexes were symmetrical in all extremities and she had no pathological reflexes.  Sensory - Light touch, temperature/pinprick were assessed and were symmetrical.    Coordination - The patient had normal movements in the hands and feet with no ataxia or dysmetria.  Tremor was absent.  Gait and Station - deferred.   ASSESSMENT/PLAN [name] is a 75 y.o. female with history of anxiety, falls, right PICA aneurysm status post clipping in 1998, status post pipeline stenting in 2019, recent small subacute right cerebellum infarct admitted for headache.    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  Continue on discharge Patient counseled to be compliant with her antithrombotic medications Ongoing aggressive stroke risk factor management Therapy recommendations: SNF Disposition: Pending  Seizure Admission on 03/04/2021 for intermittent aphasia.  EEG showed seizure activity, treated with Ativan and discharged on Keppra 500 twice daily No seizure since discharge Continue Keppra 500 twice daily  History of stroke 03/04/2021 admitted for intermittent aphasia, MRI showed small right cerebellum subacute infarct, right PICA aneurysm partially thrombosed.  CTA head and neck sequelae of prior endovascular treatment of right PICA aneurysm with pipeline stent stable in position.  EF 60 to 65%.  LDL 101, A1c 5.5, discharge with aspirin 81 and Lipitor 40.  Right PICA aneurysm status post clipping in 1998, status post pipeline stenting in 2019 Recent serial CT a head and neck, MRI showed stable right PICA aneurysm with minimal change Discussed with Dr. Estanislado Pandy, no urgent cerebral angiogram needed at this time.  He will plan for routine cerebral angiogram as outpatient next week for follow-up.  Hyperlipidemia Home meds: Lipitor 40 LDL 101 last admission, goal < 70 Now continue on Lipitor 40 Continue statin at discharge  Other Stroke Risk Factors Advanced age  Other Active Problems Depression on Zoloft Falls at home  Hospital day # 0  Neurology will sign off. Please call with questions. Pt will follow up with Dr. Estanislado Pandy as outpatient next week. Thanks for the consult.   Rosalin Hawking, MD PhD Stroke Neurology 03/14/2021 7:13 PM    To contact Stroke Continuity provider, please refer to [email or web address] After hours, contact General Neurology

## 2021-03-14 NOTE — ED Notes (Signed)
Pt still in radiology.

## 2021-03-14 NOTE — Evaluation (Signed)
Occupational Therapy Evaluation Patient Details Name: [name] MRN: [id number] DOB: [birth year] Today's Date: 03/14/2021    History of Present Illness 75 y/o female admitted from SNF on 6/16 secondary to worsening headaches. Pt is s/p lumbar puncture on 6/17. MRI negative for acute abnormality. Recent admission secondary to CVA and seizures. PMH includes aneurysm s/p clipping and CVA.   Clinical Impression   PTA, pt was at [facility] for rehab and reports she was walking with PT and performing BADLs; however, unsure of reliability due to cognition. Pt currently requiring Min Guard-Min A for LB ADLs and functional mobility with RW. Pt presenting with decreased balance, cognition, and safety. Pt would benefit from further acute OT to facilitate safe dc. Recommend dc to SNF for further OT to optimize safety, independence with ADLs, and return to PLOF.     Follow Up Recommendations  SNF    Equipment Recommendations  None recommended by OT    Recommendations for Other Services       Precautions / Restrictions Precautions Precautions: Fall Restrictions Weight Bearing Restrictions: No      Mobility Bed Mobility Overal bed mobility: Needs Assistance Bed Mobility: Supine to Sit     Supine to sit: Supervision     General bed mobility comments: Supervision for safety.    Transfers Overall transfer level: Needs assistance Equipment used: None;Rolling walker (2 wheeled) Transfers: Sit to/from Stand Sit to Stand: Min assist         General transfer comment: Min A for steadying to stand without AD. Tended to rely on having legs on bed for support.    Balance Overall balance assessment: Needs assistance Sitting-balance support: No upper extremity supported;Feet supported Sitting balance-Leahy Scale: Fair     Standing balance support: Bilateral upper extremity supported;Single extremity supported;During functional activity Standing balance-Leahy Scale: Poor Standing  balance comment: reliant on at least 1 UE support                           ADL either performed or assessed with clinical judgement   ADL Overall ADL's : Needs assistance/impaired Eating/Feeding: Set up;Sitting   Grooming: Oral care;Standing;Min guard   Upper Body Bathing: Set up;Sitting   Lower Body Bathing: Minimal assistance;Sit to/from stand   Upper Body Dressing : Supervision/safety;Set up;Sitting   Lower Body Dressing: Minimal assistance;Sit to/from stand Lower Body Dressing Details (indicate cue type and reason): Pt donning socks without difficult; increased time. Min A for posterior lean in standing Toilet Transfer: Min guard;Ambulation;RW           Functional mobility during ADLs: Min guard;Rolling walker;Minimal assistance General ADL Comments: Pt presenting with decreased balance and cognition imapcting her safety with ADLs     Vision Baseline Vision/History: Wears glasses Wears Glasses: Distance only Patient Visual Report:  (Lost her galsses)       Perception     Praxis      Pertinent Vitals/Pain Pain Assessment: No/denies pain     Hand Dominance Right   Extremity/Trunk Assessment Upper Extremity Assessment Upper Extremity Assessment: Generalized weakness   Lower Extremity Assessment Lower Extremity Assessment: Defer to PT evaluation   Cervical / Trunk Assessment Cervical / Trunk Assessment: Kyphotic   Communication Communication Communication: No difficulties   Cognition Arousal/Alertness: Awake/alert Behavior During Therapy: WFL for tasks assessed/performed Overall Cognitive Status: No family/caregiver present to determine baseline cognitive functioning  General Comments: Poor awareness of safety. Slow processing noted. Very pleasant throughout. Pt reporting "I wasnt allowed to shower because I hadnt had the COVID shot".   General Comments       Exercises     Shoulder  Instructions      Home Living Family/patient expects to be discharged to:: Skilled nursing facility                                 Additional Comments: From [facility]      Prior Functioning/Environment Level of Independence: Needs assistance  Gait / Transfers Assistance Needed: Was working with therapy on ambulating with walker ADL's / Homemaking Assistance Needed: Pt reports she was independent with dressing, reports she hasn't been showering. Unsure of accuracy secondary to cognitive deficits.            OT Problem List: Impaired balance (sitting and/or standing);Decreased safety awareness      OT Treatment/Interventions: Self-care/ADL training;DME and/or AE instruction;Balance training;Therapeutic activities    OT Goals(Current goals can be found in the care plan section) Acute Rehab OT Goals Patient Stated Goal: to go home OT Goal Formulation: With patient Time For Goal Achievement: 03/28/21 Potential to Achieve Goals: Good  OT Frequency: Min 2X/week   Barriers to D/C:            Co-evaluation PT/OT/SLP Co-Evaluation/Treatment: Yes Reason for Co-Treatment: For patient/therapist safety;To address functional/ADL transfers PT goals addressed during session: Mobility/safety with mobility;Balance OT goals addressed during session: ADL's and self-care      AM-PAC OT "6 Clicks" Daily Activity     Outcome Measure Help from another person eating meals?: None Help from another person taking care of personal grooming?: A Little Help from another person toileting, which includes using toliet, bedpan, or urinal?: A Little Help from another person bathing (including washing, rinsing, drying)?: A Little Help from another person to put on and taking off regular upper body clothing?: A Little Help from another person to put on and taking off regular lower body clothing?: A Little 6 Click Score: 19   End of Session Equipment Utilized During Treatment: Gait  belt;Rolling walker Nurse Communication: Mobility status  Activity Tolerance: Patient tolerated treatment well Patient left: in bed;with call bell/phone within reach;with bed alarm set  OT Visit Diagnosis: Unsteadiness on feet (R26.81);History of falling (Z91.81);Dizziness and giddiness (R42)                Time: 7616-0737 OT Time Calculation (min): 19 min Charges:  OT General Charges $OT Visit: 1 Visit OT Evaluation $OT Eval Moderate Complexity: [place], OTR/L Acute Rehab Pager: [phone] Office: [place] 03/14/2021, 4:38 PM

## 2021-03-14 NOTE — Progress Notes (Signed)
PT Cancellation Note  Patient Details Name: AMITA ATAYDE MRN: [id number] DOB: 02/18/1946   Cancelled Treatment:    Reason Eval/Treat Not Completed: Active bedrest order Pt currently on bedrest following lumbar puncture. Will follow up as pt medically appropriate.   Lou Miner, DPT  Acute Rehabilitation Services  Pager: [phone] Office: [phone]    Rudean Hitt 03/14/2021, 2:53 PM

## 2021-03-14 NOTE — Procedures (Signed)
Diagnostic LP done at L3/4 on left. See details in Radiology report.

## 2021-03-14 NOTE — ED Notes (Addendum)
Pt transported to XR for lumbar puncture.

## 2021-03-14 NOTE — Consult Note (Signed)
Neurology stroke consult H&P  Elaine Rivera MR# [id number] 03/14/2021   CC: acute headache  History is obtained from: patient and chart  HPI: Elaine Rivera is a 75 y.o. female undergoing rehabilitation in nursing home PMHx as reviewed below with acute headache. She describes the headache as sudden onset both in the left frontal and left occipital regions, sharp, throbbing, non-radiating 10/10. She denies trauma and states that the headache was similar in character to previous headache associated with aneurysm. Based on the similarity she became concerned and EMS was activated.   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  States this feels similar to her previous aneurysm which was clipped.  She states the headache started acutely while she was lying in bed at 8 PM.  Headache is severe and constant, not relieved with Tylenol.  Did have 1 episode of vomiting.  No difficulty breathing.  No focal weakness, numbness or tingling.  No difficulty speaking or difficulty swallowing.  No chest pain or shortness of breath.  She states she normally does not have this kind of headache.  Denies any fall or trauma.  Pain radiates down the back of her neck.  No visual changes.  No weakness in her arms or legs.  No difficulty speaking.  Last time she had a headache like this states she had an aneurysm."   ROS: A complete ROS was performed and is negative except as noted in the HPI.   Past Medical History:  Diagnosis Date   Anemia    years ago   Arthritis    Basal cell carcinoma of eye    biopsy of left eye/ non cancerous   Complication of anesthesia    nausea/vomiting   Depression    GERD (gastroesophageal reflux disease)    History of kidney  stones    Hypertension    no longer on medications     Family History  Family history unknown: Yes    Social History:  reports that she has never smoked. She has never used smokeless tobacco. She reports that she does not drink alcohol and does not use drugs.   Prior to Admission medications   Medication Sig Start Date End Date Taking? Authorizing Provider  acetaminophen (TYLENOL) 325 MG tablet Take 650 mg by mouth every 6 (six) hours as needed for mild pain, fever or headache.    [provider]  ALPRAZolam Duanne Moron) 0.5 MG tablet Take 1 tablet (0.5 mg total) by mouth 2 (two) times daily as needed for anxiety. 03/10/21   Mariel Aloe, MD  atorvastatin (LIPITOR) 40 MG tablet Take 1 tablet (40 mg total) by mouth daily before supper. 03/10/21   Mariel Aloe, MD  levETIRAcetam (KEPPRA) 500 MG tablet Take 1 tablet (500 mg total) by mouth every 12 (twelve) hours. 03/10/21   Mariel Aloe, MD  senna-docusate (SENOKOT-S) 8.6-50 MG tablet Take 1 tablet by mouth at bedtime as needed for mild constipation. 03/10/21   Mariel Aloe, MD  sertraline (ZOLOFT) 50 MG tablet Take 50 mg by mouth daily. 01/17/21   [provider]  temazepam (RESTORIL) 15 MG capsule Take 1-2 capsules (15-30 mg total) by mouth at bedtime as needed for sleep. 03/10/21   Mariel Aloe, MD    Exam: Current  vital signs: BP 108/67   Pulse 63   Temp 98.4 F (36.9 C) (Oral)   Resp 14   SpO2 93%   Physical Exam  Constitutional: Appears well-developed and well-nourished.  Psych: Affect appropriate to situation Eyes: No scleral injection HENT: No OP obstruction. Head: Normocephalic.  Cardiovascular: Normal rate and regular rhythm.  Respiratory: Effort normal, symmetric excursions bilaterally, no audible wheezing. GI: Soft.  No distension. There is no tenderness.  Skin: WDI  Neuro: Mental Status: Patient is awake, alert, oriented to person, place, month, year, and situation. Patient is able to  give a clear and coherent history. Speech fluent, intact comprehension and repetition. Mild dysarthria. No meningeal signs. No signs of aphasia or neglect. Visual Fields are full. Pupils are equal, round, and reactive to light. EOMI without ptosis or diploplia.  Facial sensation is symmetric to temperature Facial movement is symmetric.  Hearing is intact to voice. Uvula midline and palate elevates symmetrically. Shoulder shrug is symmetric. Tongue is midline without atrophy or fasciculations.  Tone is normal. Bulk is normal. 5/5 strength was present in all four extremities. Sensation is symmetric to light touch and temperature in the arms and legs. Deep Tendon Reflexes: 2+ and symmetric in the biceps and patellae. Toes are mute bilaterally. FNF and HKS are intact bilaterally. Gait - Deferred  I have reviewed labs in epic and the pertinent results are:   Ref. Range 03/13/2021 23:13  Glucose Latest Ref Range: 70 - 99 mg/dL 102 (H)    Ref. Range 03/05/2021 18:43  Hemoglobin A1C Latest Ref Range: 4.8 - 5.6 % 5.5     I have reviewed the images obtained: NCT head showed No acute intracranial abnormalities. 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 No significant change since prior study 03/06/2021. CTA head and neck showed No emergent large vessel occlusion or high-grade stenosis of the intracranial arteries. Right PICA aneurysm with stent in the right vertebral artery V4 segment.  Assessment: Elaine Rivera is a 75 y.o. female PMHx previous history of aneurysm clipping 1998 and R PICA aneurysm coiling in 2019 with acute onset headache. She describes her headache as severe and similar in character to previous headache associate with aneurysm. Lumbar puncture was attempted however unsuccessful. Currently her headache is resolved and imaging was unrevealing and will reassess and likely discharge.   Plan: - Consider MRI brain without contrast. - Blood pressure goal  <140/90. - Neurology will reassess.  Electronically signed by:  Lynnae Sandhoff, MD Page: [phone] 03/14/2021, 9:30 AM

## 2021-03-14 NOTE — Evaluation (Signed)
Physical Therapy Evaluation Patient Details Name: Elaine Rivera MRN: [id number] DOB: [birth year] Today's Date: 03/14/2021   History of Present Illness  Pt is a 75 y/o female admitted from SNF on 6/16 secondary to worsening headaches. Pt is s/p lumbar puncture on 6/17. MRI negative for acute abnormality. Recent admission secondary to CVA and seizures. PMH includes aneurysm s/p clipping and CVA.  Clinical Impression  Pt admitted secondary to problem above with deficits below. Pt with cognitive deficits, weakness and unsteadiness. Requiring min A for steadying throughout mobility tasks. Required cues for safe use of RW as well. Recommend return to SNF at d/c for continued therapies. Will continue to follow acutely.     Follow Up Recommendations SNF    Equipment Recommendations  None recommended by PT    Recommendations for Other Services       Precautions / Restrictions Precautions Precautions: Fall Restrictions Weight Bearing Restrictions: No      Mobility  Bed Mobility Overal bed mobility: Needs Assistance Bed Mobility: Supine to Sit     Supine to sit: Supervision     General bed mobility comments: Supervision for safety.    Transfers Overall transfer level: Needs assistance Equipment used: None;Rolling walker (2 wheeled) Transfers: Sit to/from Stand Sit to Stand: Min assist         General transfer comment: Min A for steadying to stand without AD. Tended to rely on having legs on bed for support.  Ambulation/Gait Ambulation/Gait assistance: Min guard;Min assist Gait Distance (Feet): 20 Feet Assistive device: Rolling walker (2 wheeled) Gait Pattern/deviations: Step-through pattern;Decreased stride length Gait velocity: Decreased   General Gait Details: Unsteadiness noted. Requiring min guard to min A for steadying. Fatigued easily. Required safety cues for safe use of RW.  Stairs            Wheelchair Mobility    Modified Rankin (Stroke Patients  Only)       Balance Overall balance assessment: Needs assistance Sitting-balance support: No upper extremity supported;Feet supported Sitting balance-Leahy Scale: Fair     Standing balance support: Bilateral upper extremity supported;Single extremity supported;During functional activity Standing balance-Leahy Scale: Poor Standing balance comment: reliant on at least 1 UE support                             Pertinent Vitals/Pain Pain Assessment: No/denies pain    Home Living Family/patient expects to be discharged to:: Skilled nursing facility                 Additional Comments: From [facility]    Prior Function Level of Independence: Needs assistance   Gait / Transfers Assistance Needed: Was working with therapy on ambulating with walker  ADL's / Homemaking Assistance Needed: Pt reports she was independent with dressing, reports she hasn't been showering. Unsure of accuracy secondary to cognitive deficits.        Hand Dominance   Dominant Hand: Right    Extremity/Trunk Assessment   Upper Extremity Assessment Upper Extremity Assessment: Defer to OT evaluation    Lower Extremity Assessment Lower Extremity Assessment: Generalized weakness    Cervical / Trunk Assessment Cervical / Trunk Assessment: Kyphotic  Communication   Communication: No difficulties  Cognition Arousal/Alertness: Awake/alert Behavior During Therapy: WFL for tasks assessed/performed Overall Cognitive Status: No family/caregiver present to determine baseline cognitive functioning  General Comments: Poor awareness of safety. Slow processing noted. Very pleasant throughout.      General Comments      Exercises     Assessment/Plan    PT Assessment Patient needs continued PT services  PT Problem List Decreased strength;Decreased activity tolerance;Decreased range of motion;Decreased balance;Decreased mobility;Decreased knowledge  of use of DME;Decreased knowledge of precautions;Decreased cognition;Decreased safety awareness       PT Treatment Interventions DME instruction;Gait training;Stair training;Therapeutic activities;Functional mobility training;Therapeutic exercise;Balance training;Patient/family education;Cognitive remediation;Neuromuscular re-education    PT Goals (Current goals can be found in the Care Plan section)  Acute Rehab PT Goals Patient Stated Goal: to go home PT Goal Formulation: With patient Time For Goal Achievement: 03/28/21 Potential to Achieve Goals: Fair    Frequency Min 2X/week   Barriers to discharge        Co-evaluation PT/OT/SLP Co-Evaluation/Treatment: Yes Reason for Co-Treatment: For patient/therapist safety;To address functional/ADL transfers PT goals addressed during session: Mobility/safety with mobility;Balance         AM-PAC PT "6 Clicks" Mobility  Outcome Measure Help needed turning from your back to your side while in a flat bed without using bedrails?: None Help needed moving from lying on your back to sitting on the side of a flat bed without using bedrails?: None Help needed moving to and from a bed to a chair (including a wheelchair)?: A Little Help needed standing up from a chair using your arms (e.g., wheelchair or bedside chair)?: A Little Help needed to walk in hospital room?: A Little Help needed climbing 3-5 steps with a railing? : A Lot 6 Click Score: 19    End of Session Equipment Utilized During Treatment: Gait belt Activity Tolerance: Patient limited by fatigue Patient left: in chair;with call bell/phone within reach;with chair alarm set Nurse Communication: Mobility status PT Visit Diagnosis: Unsteadiness on feet (R26.81);Muscle weakness (generalized) (M62.81);Difficulty in walking, not elsewhere classified (R26.2)    Time: 4128-7867 PT Time Calculation (min) (ACUTE ONLY): 16 min   Charges:   PT Evaluation $PT Eval Low Complexity: 1 Low           Lou Miner, DPT  Acute Rehabilitation Services  Pager: [phone] Office: [phone]   Rudean Hitt 03/14/2021, 4:25 PM

## 2021-03-14 NOTE — TOC Initial Note (Signed)
Transition of Care [facility]) - Initial/Assessment Note    Patient Details  Name: Elaine Rivera MRN: [id number] Date of Birth: [birth year]  Transition of Care [facility]) CM/SW Contact:    Geralynn Ochs, LCSW Phone Number: 03/14/2021, 3:00 PM  Clinical Narrative:    CSW met with patient to confirm plan to return to [facility] at discharge. CSW discussed with MD that new therapy evals will need to be completed since patient was admitted, new insurance authorization will need to be obtained. CSW clarified with [facility] that a new auth would need to be obtained since patient was admitted, and they confirmed that a new authorization will be needed. MD ordered therapy evals, and CSW to initiate insurance authorization once therapy evals are complete. CSW confirmed that [facility] can take the patient back when ready. CSW to follow.              Expected Discharge Plan: Skilled Nursing Facility Barriers to Discharge: Insurance Authorization   Patient Goals and CMS Choice Patient states their goals for this hospitalization and ongoing recovery are:: to get back to [facility] CMS Medicare.gov Compare Post Acute Care list provided to:: Patient Choice offered to / list presented to : Patient  Expected Discharge Plan and Services Expected Discharge Plan: [place] Choice: [place] Living arrangements for the past 2 months: Single Family Home                                      Prior Living Arrangements/Services Living arrangements for the past 2 months: Single Family Home Lives with:: Self Patient language and need for interpreter reviewed:: No Do you feel safe going back to the place where you live?: Yes      Need for Family Participation in Patient Care: No (Comment) Care giver support system in place?: No (comment) Current home services: DME Criminal Activity/Legal Involvement Pertinent to Current Situation/Hospitalization: No - Comment  as needed  Activities of Daily Living      Permission Sought/Granted Permission sought to share information with : Facility Sport and exercise psychologist, Family Supports Permission granted to share information with : Yes, Verbal Permission Granted  Share Information with NAME: Britney  Permission granted to share info w AGENCY: SNF  Permission granted to share info w Relationship: Daughter     Emotional Assessment Appearance:: Appears stated age Attitude/Demeanor/Rapport: Engaged Affect (typically observed): Appropriate Orientation: : Oriented to Self, Oriented to Place, Oriented to  Time, Oriented to Situation Alcohol / Substance Use: Not Applicable Psych Involvement: No (comment)  Admission diagnosis:  Cerebral aneurysm [I67.1] Bad headache [R51.9] Worsening headaches [R51.9] Headache [R51.9] Patient Active Problem List   Diagnosis Date Noted   Worsening headaches 03/14/2021   Focal seizure ([place]) 03/06/2021   CVA (cerebral vascular accident) ([place]) 03/06/2021   Ischemic cerebrovascular accident (CVA) ([place]) 03/05/2021   Hypokalemia 03/05/2021   Anxiety 03/05/2021   Transient speech disturbance 03/05/2021   Lung nodule 03/05/2021   Aphasia    Fall    Anemia 02/28/2020   Shortness of breath 01/24/2020   Dizziness 01/24/2020   Herpes 10/04/2018   Facial cellulitis 10/03/2018   Brain aneurysm 09/05/2018   NSAID long-term use 04/20/2016   Scoliosis 04/20/2016   Depression 04/20/2016   HTN (hypertension) 04/20/2016   GERD (gastroesophageal reflux disease) 04/20/2016   Occult GI bleeding 04/20/2016   PCP:  Carol Ada, MD Pharmacy:  [place], [place] [address] Phone: [phone] Fax: [phone]     Social Determinants of Health (SDOH) Interventions    Readmission Risk Interventions No flowsheet data found.

## 2021-03-15 DIAGNOSIS — M549 Dorsalgia, unspecified: Secondary | ICD-10-CM | POA: Diagnosis not present

## 2021-03-15 DIAGNOSIS — I10 Essential (primary) hypertension: Secondary | ICD-10-CM | POA: Diagnosis not present

## 2021-03-15 DIAGNOSIS — R5381 Other malaise: Secondary | ICD-10-CM | POA: Diagnosis not present

## 2021-03-15 DIAGNOSIS — G4489 Other headache syndrome: Secondary | ICD-10-CM | POA: Diagnosis not present

## 2021-03-15 DIAGNOSIS — I671 Cerebral aneurysm, nonruptured: Secondary | ICD-10-CM | POA: Diagnosis not present

## 2021-03-15 DIAGNOSIS — E46 Unspecified protein-calorie malnutrition: Secondary | ICD-10-CM | POA: Diagnosis not present

## 2021-03-15 DIAGNOSIS — Z79899 Other long term (current) drug therapy: Secondary | ICD-10-CM | POA: Diagnosis not present

## 2021-03-15 DIAGNOSIS — F29 Unspecified psychosis not due to a substance or known physiological condition: Secondary | ICD-10-CM | POA: Diagnosis not present

## 2021-03-15 DIAGNOSIS — R911 Solitary pulmonary nodule: Secondary | ICD-10-CM | POA: Diagnosis not present

## 2021-03-15 DIAGNOSIS — K219 Gastro-esophageal reflux disease without esophagitis: Secondary | ICD-10-CM | POA: Diagnosis not present

## 2021-03-15 DIAGNOSIS — E876 Hypokalemia: Secondary | ICD-10-CM | POA: Diagnosis not present

## 2021-03-15 DIAGNOSIS — Z85828 Personal history of other malignant neoplasm of skin: Secondary | ICD-10-CM | POA: Diagnosis not present

## 2021-03-15 DIAGNOSIS — R300 Dysuria: Secondary | ICD-10-CM | POA: Diagnosis not present

## 2021-03-15 DIAGNOSIS — Z20822 Contact with and (suspected) exposure to covid-19: Secondary | ICD-10-CM | POA: Diagnosis not present

## 2021-03-15 DIAGNOSIS — R479 Unspecified speech disturbances: Secondary | ICD-10-CM | POA: Diagnosis not present

## 2021-03-15 DIAGNOSIS — G4700 Insomnia, unspecified: Secondary | ICD-10-CM | POA: Diagnosis not present

## 2021-03-15 DIAGNOSIS — G4089 Other seizures: Secondary | ICD-10-CM | POA: Diagnosis not present

## 2021-03-15 DIAGNOSIS — W19XXXD Unspecified fall, subsequent encounter: Secondary | ICD-10-CM | POA: Diagnosis not present

## 2021-03-15 DIAGNOSIS — I6389 Other cerebral infarction: Secondary | ICD-10-CM | POA: Diagnosis not present

## 2021-03-15 DIAGNOSIS — Z743 Need for continuous supervision: Secondary | ICD-10-CM | POA: Diagnosis not present

## 2021-03-15 DIAGNOSIS — I469 Cardiac arrest, cause unspecified: Secondary | ICD-10-CM | POA: Diagnosis not present

## 2021-03-15 DIAGNOSIS — K5900 Constipation, unspecified: Secondary | ICD-10-CM | POA: Diagnosis not present

## 2021-03-15 DIAGNOSIS — R519 Headache, unspecified: Secondary | ICD-10-CM | POA: Diagnosis not present

## 2021-03-15 DIAGNOSIS — F329 Major depressive disorder, single episode, unspecified: Secondary | ICD-10-CM | POA: Diagnosis not present

## 2021-03-15 DIAGNOSIS — R569 Unspecified convulsions: Secondary | ICD-10-CM | POA: Diagnosis not present

## 2021-03-15 DIAGNOSIS — N3000 Acute cystitis without hematuria: Secondary | ICD-10-CM | POA: Diagnosis not present

## 2021-03-15 DIAGNOSIS — N390 Urinary tract infection, site not specified: Secondary | ICD-10-CM | POA: Diagnosis not present

## 2021-03-15 DIAGNOSIS — Z7401 Bed confinement status: Secondary | ICD-10-CM | POA: Diagnosis not present

## 2021-03-15 DIAGNOSIS — R531 Weakness: Secondary | ICD-10-CM | POA: Diagnosis not present

## 2021-03-15 DIAGNOSIS — Z5181 Encounter for therapeutic drug level monitoring: Secondary | ICD-10-CM | POA: Diagnosis not present

## 2021-03-15 DIAGNOSIS — F419 Anxiety disorder, unspecified: Secondary | ICD-10-CM | POA: Diagnosis not present

## 2021-03-15 DIAGNOSIS — H04123 Dry eye syndrome of bilateral lacrimal glands: Secondary | ICD-10-CM | POA: Diagnosis not present

## 2021-03-15 DIAGNOSIS — Z7982 Long term (current) use of aspirin: Secondary | ICD-10-CM | POA: Diagnosis not present

## 2021-03-15 MED ORDER — ASPIRIN 81 MG PO TBEC: 81.0000 mg | DELAYED_RELEASE_TABLET | Freq: Every day | ORAL | 11 refills | Status: DC

## 2021-03-15 MED ORDER — LEVETIRACETAM 500 MG PO TABS: 500.0000 mg | ORAL_TABLET | Freq: Two times a day (BID) | ORAL | Status: DC

## 2021-03-15 NOTE — TOC Transition Note (Signed)
Transition of Care [facility]) - CM/SW Discharge Note   Patient Details  Name: Elaine Rivera MRN: [id number] Date of Birth: 11-Jun-1946  Transition of Care [facility]) CM/SW Contact:  Coralee Pesa, [place] Phone Number: 03/15/2021, 12:26 PM   Clinical Narrative:     Pt to be transported via [facility] to [facility].  Nurse to call report to [phone]. Rm# [id number]  Final next level of care: Skilled Nursing Facility Barriers to Discharge: Barriers Resolved   Patient Goals and CMS Choice Patient states their goals for this hospitalization and ongoing recovery are:: to get back to [facility] CMS Medicare.gov Compare Post Acute Care list provided to:: Patient Choice offered to / list presented to : Patient  Discharge Placement              Patient chooses bed at: [facility] Patient to be transferred to facility by: [place] Name of family member notified: [place] Patient and family notified of of transfer: 03/15/21  Discharge Plan and Services     Post Acute Care Choice: [place]                               Social Determinants of Health (SDOH) Interventions     Readmission Risk Interventions No flowsheet data found.

## 2021-03-15 NOTE — Progress Notes (Signed)
Pt IV removed per protocol. Pt discharged to [facility] and pt picked up by PTAR to be transported off unit to disposition via stretcher with belongings to the side. Delia Heady RN

## 2021-03-15 NOTE — Discharge Summary (Signed)
Physician Discharge Summary  LIV RALLIS TIW:[id number] DOB: 10-01-1945 DOA: 03/13/2021  PCP: Carol Ada, MD  Admit date: 03/13/2021 Discharge date: 03/15/2021  Admitted From: SNF Disposition:  SNF  Recommendations for Outpatient Follow-up:  Follow up with PCP in 1 week of d/c from SNF Follow up with Dr Estanislado Pandy in 1 week Please obtain BMP/CBC in 1 week to ensure stable hemoglobin and potassium/calcium.   Home Health: No  Equipment/Devices: none   Discharge Condition: Good CODE STATUS: Full  Diet recommendation: Heart Healthy  Brief/Interim Summary: From H&P:   "Elaine Rivera is a 75 y.o. 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  She presented at [facility] ED from home with complaints of sudden onset severe headache associated with nausea and vomiting.  States symptoms are similar to prior aneurysm.  Work-up in the ED was unrevealing.  LP was attempted by EDP but unsuccessful.  Neurology was consulted with recommendation for LP under fluoroscopy likely by IR.  TRH, hospitalist team, was asked to admit."  Interim: Pt had negative LP and MRI. It did show stable appearing aneurysm and IR outpt follow up recommended.   Subjective on day of discharge: Feeling good, no head pain. Wants to make sure the MRI head shows the aneurysm.   Discharge Diagnoses:  Active Problems:   Cerebral aneurysm   Bad headache  Pt improved, workup neg. Will follow up with Dr. Estanislado Pandy of IR next week outpatient regarding the aneurysm. Aspirin was started.    Discharge Instructions  Discharge Instructions     Call MD for:  difficulty breathing, headache or visual disturbances   Complete by: As directed    Call MD for:  persistant dizziness or light-headedness   Complete  by: As directed    Call MD for:  severe uncontrolled pain   Complete by: As directed    Diet - low sodium heart healthy   Complete by: As directed    Increase activity slowly   Complete by: As directed       Allergies as of 03/15/2021       Reactions   Sulfa Antibiotics Other (See Comments)   Reaction:  Unknown         Medication List     STOP taking these medications    sertraline 50 MG tablet Commonly known as: ZOLOFT       TAKE these medications    acetaminophen 325 MG tablet Commonly known as: TYLENOL Take 650 mg by mouth every 6 (six) hours as needed for mild pain, fever or headache.   ALPRAZolam 0.5 MG tablet Commonly known as: XANAX Take 1 tablet (0.5 mg total) by mouth 2 (two) times daily as needed for anxiety.   aspirin 81 MG EC tablet Take 1 tablet (81 mg total) by mouth daily. Swallow whole. Start taking on: March 16, 2021   atorvastatin 40 MG tablet Commonly known as: LIPITOR Take 1 tablet (40 mg total) by mouth daily before supper.   levETIRAcetam 500 MG tablet Commonly known as: KEPPRA Take 1 tablet (500 mg total) by mouth 2 (two) times daily. What changed: when to take this   polyvinyl alcohol 1.4 % ophthalmic solution Commonly known as: LIQUIFILM TEARS Place 2 drops into both eyes in the morning, at noon, and at bedtime.   senna-docusate 8.6-50 MG tablet  Commonly known as: Senokot-S Take 1 tablet by mouth at bedtime as needed for mild constipation.   temazepam 15 MG capsule Commonly known as: RESTORIL Take 1-2 capsules (15-30 mg total) by mouth at bedtime as needed for sleep. What changed: how much to take        Allergies  Allergen Reactions   Sulfa Antibiotics Other (See Comments)    Reaction:  Unknown     Consultations: Neurology Interventional Radiology   Procedures/Studies:     Discharge Exam: Vitals:   03/15/21 0837 03/15/21 1133  BP: 130/61 126/73  Pulse: 69 66  Resp: 18 18  Temp: 98.2 F (36.8 C) 98.5  F (36.9 C)  SpO2: 97% 96%    General: Pt is alert, awake, not in acute distress Cardiovascular: RRR, S1/S2 +, no edema Respiratory: CTA bilaterally, no wheezing, no rhonchi, no respiratory distress, no conversational dyspnea  Abdominal: Soft, NT, ND, bowel sounds + Extremities: no edema, no cyanosis Psych: Normal mood and affect,    The results of significant diagnostics from this hospitalization (including imaging, microbiology, ancillary and laboratory) are listed below for reference.     Microbiology: Recent Results (from the past 240 hour(s))  Culture, Urine     Status: Abnormal   Collection Time: 03/06/21  8:42 AM   Specimen: Urine, Random  Result Value Ref Range Status   Specimen Description URINE, RANDOM  Final   Special Requests   Final    NONE Performed at [facility], [address], [place], [place] [address]    Culture MULTIPLE SPECIES PRESENT, SUGGEST RECOLLECTION (A)  Final   Report Status 03/07/2021 FINAL  Final  SARS CORONAVIRUS 2 (TAT 6-24 HRS) Nasopharyngeal Nasopharyngeal Swab     Status: None   Collection Time: 03/09/21 10:12 AM   Specimen: Nasopharyngeal Swab  Result Value Ref Range Status   SARS Coronavirus 2 NEGATIVE NEGATIVE Final    Comment: (NOTE) SARS-CoV-2 target nucleic acids are NOT DETECTED.  The SARS-CoV-2 RNA is generally detectable in upper and lower respiratory specimens during the acute phase of infection. Negative results do not preclude SARS-CoV-2 infection, do not rule out co-infections with other pathogens, and should not be used as the sole basis for treatment or other patient management decisions. Negative results must be combined with clinical observations, patient history, and epidemiological information. The expected result is Negative.  Fact Sheet for Patients: SugarRoll.be  Fact Sheet for Healthcare Providers: https://www.woods-mathews.com/  This test is not yet approved  or cleared by the [place] FDA and  has been authorized for detection and/or diagnosis of SARS-CoV-2 by FDA under an Emergency Use Authorization (EUA). This EUA will remain  in effect (meaning this test can be used) for the duration of the COVID-19 declaration under Se ction 564(b)(1) of the Act, 21 U.S.C. section 360bbb-3(b)(1), unless the authorization is terminated or revoked sooner.  Performed at [facility], [place] [address] [place], [place] [address]   Resp Panel by RT-PCR (Flu A&B, Covid) Nasopharyngeal Swab     Status: None   Collection Time: 03/14/21  2:26 AM   Specimen: Nasopharyngeal Swab; Nasopharyngeal(NP) swabs in vial transport medium  Result Value Ref Range Status   SARS Coronavirus 2 by RT PCR NEGATIVE NEGATIVE Final    Comment: (NOTE) SARS-CoV-2 target nucleic acids are NOT DETECTED.  The SARS-CoV-2 RNA is generally detectable in upper respiratory specimens during the acute phase of infection. The lowest concentration of SARS-CoV-2 viral copies this assay can detect is  138 copies/mL. A negative result does not preclude SARS-Cov-2 infection and should not be used as the sole basis for treatment or other patient management decisions. A negative result may occur with  improper specimen collection/handling, submission of specimen other than nasopharyngeal swab, presence of viral mutation(s) within the areas targeted by this assay, and inadequate number of viral copies(<138 copies/mL). A negative result must be combined with clinical observations, patient history, and epidemiological information. The expected result is Negative.  Fact Sheet for Patients:  EntrepreneurPulse.com.au  Fact Sheet for Healthcare Providers:  IncredibleEmployment.be  This test is no t yet approved or cleared by the [place] FDA and  has been authorized for detection and/or diagnosis of SARS-CoV-2 by FDA under an Emergency Use  Authorization (EUA). This EUA will remain  in effect (meaning this test can be used) for the duration of the COVID-19 declaration under Section 564(b)(1) of the Act, 21 U.S.C.section 360bbb-3(b)(1), unless the authorization is terminated  or revoked sooner.       Influenza A by PCR NEGATIVE NEGATIVE Final   Influenza B by PCR NEGATIVE NEGATIVE Final    Comment: (NOTE) The Xpert Xpress SARS-CoV-2/FLU/RSV plus assay is intended as an aid in the diagnosis of influenza from Nasopharyngeal swab specimens and should not be used as a sole basis for treatment. Nasal washings and aspirates are unacceptable for Xpert Xpress SARS-CoV-2/FLU/RSV testing.  Fact Sheet for Patients: EntrepreneurPulse.com.au  Fact Sheet for Healthcare Providers: IncredibleEmployment.be  This test is not yet approved or cleared by the [place] FDA and has been authorized for detection and/or diagnosis of SARS-CoV-2 by FDA under an Emergency Use Authorization (EUA). This EUA will remain in effect (meaning this test can be used) for the duration of the COVID-19 declaration under Section 564(b)(1) of the Act, 21 U.S.C. section 360bbb-3(b)(1), unless the authorization is terminated or revoked.  Performed at [facility], [place] [address], [place], [place] [address]   CSF culture w Gram Stain     Status: None (Preliminary result)   Collection Time: 03/14/21 12:46 PM   Specimen: PATH Cytology CSF; Cerebrospinal Fluid  Result Value Ref Range Status   Specimen Description CSF  Final   Special Requests NONE  Final   Gram Stain NO WBC SEEN NO ORGANISMS SEEN CYTOSPIN SMEAR   Final   Culture   Final    NO GROWTH < 24 HOURS Performed at [facility], [place] [address] [place], [place] [address]    Report Status PENDING  Incomplete  Surgical pcr screen     Status: None   Collection Time: 03/14/21  4:58 PM   Specimen: Nasal Mucosa; Nasal Swab  Result Value Ref Range  Status   MRSA, PCR NEGATIVE NEGATIVE Final   Staphylococcus aureus NEGATIVE NEGATIVE Final    Comment: (NOTE) The Xpert SA Assay (FDA approved for NASAL specimens in patients 42 years of age and older), is one component of a comprehensive surveillance program. It is not intended to diagnose infection nor to guide or monitor treatment. Performed at [facility], [place] [address], [place], [place] [address]      Labs: BNP (last 3 results) Recent Labs    03/13/21 2314  BNP 82.9   Basic Metabolic Panel: Recent Labs  Lab 03/13/21 2313 03/14/21 0547  NA 138 140  K 3.3* 3.7  CL 105 108  CO2 24 25  GLUCOSE 102* 92  BUN 10 8  CREATININE 0.59 0.57  CALCIUM 8.8* 8.6*  MG  --  2.0  PHOS  --  3.2    CBC: Recent Labs  Lab 03/13/21 2313 03/14/21 0547  WBC 6.9 4.9  NEUTROABS 4.4  --   HGB 11.9* 11.3*  HCT 38.2 36.5  MCV 80.9 81.5  PLT 301 269   Microbiology Recent Results (from the past 240 hour(s))  Culture, Urine     Status: Abnormal   Collection Time: 03/06/21  8:42 AM   Specimen: Urine, Random  Result Value Ref Range Status   Specimen Description URINE, RANDOM  Final   Special Requests   Final    NONE Performed at [facility], [place] [address] [place] [address], [place], [place] [address]    Culture MULTIPLE SPECIES PRESENT, SUGGEST RECOLLECTION (A)  Final   Report Status 03/07/2021 FINAL  Final  SARS CORONAVIRUS 2 (TAT 6-24 HRS) Nasopharyngeal Nasopharyngeal Swab     Status: None   Collection Time: 03/09/21 10:12 AM   Specimen: Nasopharyngeal Swab  Result Value Ref Range Status   SARS Coronavirus 2 NEGATIVE NEGATIVE Final    Comment: (NOTE) SARS-CoV-2 target nucleic acids are NOT DETECTED.  The SARS-CoV-2 RNA is generally detectable in upper and lower respiratory specimens during the acute phase of infection. Negative results do not preclude SARS-CoV-2 infection, do not rule out co-infections with other pathogens, and should not be used as the sole basis for  treatment or other patient management decisions. Negative results must be combined with clinical observations, patient history, and epidemiological information. The expected result is Negative.  Fact Sheet for Patients: SugarRoll.be  Fact Sheet for Healthcare Providers: https://www.woods-mathews.com/  This test is not yet approved or cleared by the [place] FDA and  has been authorized for detection and/or diagnosis of SARS-CoV-2 by FDA under an Emergency Use Authorization (EUA). This EUA will remain  in effect (meaning this test can be used) for the duration of the COVID-19 declaration under Se ction 564(b)(1) of the Act, 21 U.S.C. section 360bbb-3(b)(1), unless the authorization is terminated or revoked sooner.  Performed at [facility], [place] [address], [place], [place] [address]   Resp Panel by RT-PCR (Flu A&B, Covid) Nasopharyngeal Swab     Status: None   Collection Time: 03/14/21  2:26 AM   Specimen: Nasopharyngeal Swab; Nasopharyngeal(NP) swabs in vial transport medium  Result Value Ref Range Status   SARS Coronavirus 2 by RT PCR NEGATIVE NEGATIVE Final    Comment: (NOTE) SARS-CoV-2 target nucleic acids are NOT DETECTED.  The SARS-CoV-2 RNA is generally detectable in upper respiratory specimens during the acute phase of infection. The lowest concentration of SARS-CoV-2 viral copies this assay can detect is 138 copies/mL. A negative result does not preclude SARS-Cov-2 infection and should not be used as the sole basis for treatment or other patient management decisions. A negative result may occur with  improper specimen collection/handling, submission of specimen other than nasopharyngeal swab, presence of viral mutation(s) within the areas targeted by this assay, and inadequate number of viral copies(<138 copies/mL). A negative result must be combined with clinical observations, patient history, and  epidemiological information. The expected result is Negative.  Fact Sheet for Patients:  EntrepreneurPulse.com.au  Fact Sheet for Healthcare Providers:  IncredibleEmployment.be  This test is no t yet approved or cleared by the [place] FDA and  has been authorized for detection and/or diagnosis of SARS-CoV-2 by FDA under an Emergency Use Authorization (EUA). This EUA will remain  in effect (meaning this test can be used) for the  duration of the COVID-19 declaration under Section 564(b)(1) of the Act, 21 U.S.C.section 360bbb-3(b)(1), unless the authorization is terminated  or revoked sooner.       Influenza A by PCR NEGATIVE NEGATIVE Final   Influenza B by PCR NEGATIVE NEGATIVE Final    Comment: (NOTE) The Xpert Xpress SARS-CoV-2/FLU/RSV plus assay is intended as an aid in the diagnosis of influenza from Nasopharyngeal swab specimens and should not be used as a sole basis for treatment. Nasal washings and aspirates are unacceptable for Xpert Xpress SARS-CoV-2/FLU/RSV testing.  Fact Sheet for Patients: EntrepreneurPulse.com.au  Fact Sheet for Healthcare Providers: IncredibleEmployment.be  This test is not yet approved or cleared by the [place] FDA and has been authorized for detection and/or diagnosis of SARS-CoV-2 by FDA under an Emergency Use Authorization (EUA). This EUA will remain in effect (meaning this test can be used) for the duration of the COVID-19 declaration under Section 564(b)(1) of the Act, 21 U.S.C. section 360bbb-3(b)(1), unless the authorization is terminated or revoked.  Performed at [facility], [place] [address], [place], [place] [address]   CSF culture w Gram Stain     Status: None (Preliminary result)   Collection Time: 03/14/21 12:46 PM   Specimen: PATH Cytology CSF; Cerebrospinal Fluid  Result Value Ref Range Status   Specimen Description CSF  Final   Special  Requests NONE  Final   Gram Stain NO WBC SEEN NO ORGANISMS SEEN CYTOSPIN SMEAR   Final   Culture   Final    NO GROWTH < 24 HOURS Performed at [facility], [place] [address], [place], [place] [address]    Report Status PENDING  Incomplete  Surgical pcr screen     Status: None   Collection Time: 03/14/21  4:58 PM   Specimen: Nasal Mucosa; Nasal Swab  Result Value Ref Range Status   MRSA, PCR NEGATIVE NEGATIVE Final   Staphylococcus aureus NEGATIVE NEGATIVE Final    Comment: (NOTE) The Xpert SA Assay (FDA approved for NASAL specimens in patients 42 years of age and older), is one component of a comprehensive surveillance program. It is not intended to diagnose infection nor to guide or monitor treatment. Performed at [facility], [place] [address], [place],  [address]      Patient was seen and examined on the day of discharge and was found to be in stable condition. Time coordinating discharge: 35 minutes including assessment and coordination of care, as well as examination of the patient.   SIGNED:  Shelda Pal, DO [facility] 03/15/2021, 12:08 PM

## 2021-03-15 NOTE — Discharge Instructions (Signed)
You were cared for by a hospitalist during your hospital stay. If you have any questions about your discharge medications or the care you received while you were in the hospital after you are discharged, you can call the unit and ask to speak with the hospitalist on call if the hospitalist that took care of you is not available. Once you are discharged, your primary care physician will handle any further medical issues. Please note that NO REFILLS for any discharge medications will be authorized once you are discharged, as it is imperative that you return to your primary care physician (or establish a relationship with a primary care physician if you do not have one) for your aftercare needs so that they can reassess your need for medications and monitor your lab values.  Make sure you start taking aspirin daily in addition to your other medications.  Make sure to discuss with your PCP/medical team about finding a new medication to replace the Zoloft.

## 2021-03-15 NOTE — Progress Notes (Signed)
Pt discharge education and instructions completed. Pt discharge to [facility] and report called off to nurse Hinton Dyer at the facility. Pt telemetry removed and Pt awaiting on PTAR to transport off to disposition. Delia Heady RN

## 2021-03-15 NOTE — TOC Progression Note (Addendum)
Transition of Care [facility]) - Progression Note    Patient Details  Name: Elaine Rivera MRN: [id number] Date of Birth: 02/02/1946  Transition of Care [facility]) CM/SW [place], [place] Phone Number: 03/15/2021, 10:20 AM  Clinical Narrative:     CSW was notified by [employer] that pt had been approved to go back to SNF at [facility]. Auth # [id number] Ambulance transport has gone to [facility] and information was given to Dr. Nani Ravens, he noted he would take care of it. SW will follow for DC needs.  Ambulance approved [phone] Expected Discharge Plan: [place] Barriers to Discharge: Insurance Authorization  Expected Discharge Plan and Services Expected Discharge Plan: [place] Choice: [place] arrangements for the past 2 months: Single Family Home                                       Social Determinants of Health (SDOH) Interventions    Readmission Risk Interventions No flowsheet data found.

## 2021-03-15 NOTE — Plan of Care (Signed)

## 2021-03-17 LAB — CSF CULTURE W GRAM STAIN
Culture: NO GROWTH
Gram Stain: NONE SEEN

## 2021-03-18 ENCOUNTER — Other Ambulatory Visit (HOSPITAL_COMMUNITY): Payer: Self-pay | Admitting: Interventional Radiology

## 2021-03-18 DIAGNOSIS — I671 Cerebral aneurysm, nonruptured: Secondary | ICD-10-CM

## 2021-03-20 ENCOUNTER — Encounter (HOSPITAL_COMMUNITY): Payer: Self-pay | Admitting: Emergency Medicine

## 2021-03-20 ENCOUNTER — Emergency Department (HOSPITAL_COMMUNITY)
Admission: EM | Admit: 2021-03-20 | Discharge: 2021-03-20 | Disposition: A | Discharge status: Home / Self Care | Payer: PPO | Attending: Emergency Medicine | Admitting: Emergency Medicine

## 2021-03-20 ENCOUNTER — Other Ambulatory Visit: Payer: Self-pay

## 2021-03-20 DIAGNOSIS — Z7982 Long term (current) use of aspirin: Secondary | ICD-10-CM | POA: Insufficient documentation

## 2021-03-20 DIAGNOSIS — Z20822 Contact with and (suspected) exposure to covid-19: Secondary | ICD-10-CM | POA: Diagnosis not present

## 2021-03-20 DIAGNOSIS — Z85828 Personal history of other malignant neoplasm of skin: Secondary | ICD-10-CM | POA: Insufficient documentation

## 2021-03-20 DIAGNOSIS — N3000 Acute cystitis without hematuria: Secondary | ICD-10-CM | POA: Insufficient documentation

## 2021-03-20 DIAGNOSIS — I10 Essential (primary) hypertension: Secondary | ICD-10-CM | POA: Insufficient documentation

## 2021-03-20 LAB — COMPREHENSIVE METABOLIC PANEL
ALT: 15 U/L (ref 0–44)
AST: 20 U/L (ref 15–41)
Albumin: 3.5 g/dL (ref 3.5–5.0)
Alkaline Phosphatase: 97 U/L (ref 38–126)
Anion gap: 6 (ref 5–15)
BUN: 8 mg/dL (ref 8–23)
CO2: 24 mmol/L (ref 22–32)
Calcium: 8.7 mg/dL — ABNORMAL LOW (ref 8.9–10.3)
Chloride: 104 mmol/L (ref 98–111)
Creatinine, Ser: 0.48 mg/dL (ref 0.44–1.00)
GFR, Estimated: >60 mL/min (ref >60)
Glucose, Bld: 89 mg/dL (ref 70–99)
Potassium: 3.4 mmol/L — ABNORMAL LOW (ref 3.5–5.1)
Sodium: 134 mmol/L — ABNORMAL LOW (ref 135–145)
Total Bilirubin: 0.4 mg/dL (ref 0.3–1.2)
Total Protein: 6.6 g/dL (ref 6.5–8.1)

## 2021-03-20 LAB — URINALYSIS, ROUTINE W REFLEX MICROSCOPIC
Bilirubin Urine: NEGATIVE
Glucose, UA: NEGATIVE mg/dL
Ketones, ur: NEGATIVE mg/dL
Leukocytes,Ua: NEGATIVE
Nitrite: NEGATIVE
Protein, ur: NEGATIVE mg/dL
Specific Gravity, Urine: 1.009 (ref 1.005–1.030)
pH: 6 (ref 5.0–8.0)

## 2021-03-20 LAB — CBC WITH DIFFERENTIAL/PLATELET
Abs Immature Granulocytes: 0.03 10*3/uL (ref 0.00–0.07)
Basophils Absolute: 0 10*3/uL (ref 0.0–0.1)
Basophils Relative: 1 %
Eosinophils Absolute: 0.1 10*3/uL (ref 0.0–0.5)
Eosinophils Relative: 2 %
HCT: 39.6 % (ref 36.0–46.0)
Hemoglobin: 12.1 g/dL (ref 12.0–15.0)
Immature Granulocytes: 1 %
Lymphocytes Relative: 23 %
Lymphs Abs: 1.4 10*3/uL (ref 0.7–4.0)
MCH: 25.1 pg — ABNORMAL LOW (ref 26.0–34.0)
MCHC: 30.6 g/dL (ref 30.0–36.0)
MCV: 82.2 fL (ref 80.0–100.0)
Monocytes Absolute: 0.5 10*3/uL (ref 0.1–1.0)
Monocytes Relative: 7 %
Neutro Abs: 4.2 10*3/uL (ref 1.7–7.7)
Neutrophils Relative %: 66 %
Platelets: 299 10*3/uL (ref 150–400)
RBC: 4.82 MIL/uL (ref 3.87–5.11)
RDW: 17.1 % — ABNORMAL HIGH (ref 11.5–15.5)
WBC: 6.3 10*3/uL (ref 4.0–10.5)
nRBC: 0 % (ref 0.0–0.2)

## 2021-03-20 LAB — RESP PANEL BY RT-PCR (FLU A&B, COVID) ARPGX2
Influenza A by PCR: NEGATIVE
Influenza B by PCR: NEGATIVE
SARS Coronavirus 2 by RT PCR: NEGATIVE

## 2021-03-20 MED ORDER — DIPHENHYDRAMINE HCL 25 MG PO CAPS
25.0000 mg | ORAL_CAPSULE | Freq: Once | ORAL | Status: AC
  Administered 2021-03-20: 25 mg via ORAL
  Filled 2021-03-20 (×2): qty 1

## 2021-03-20 MED ORDER — CEPHALEXIN 500 MG PO CAPS: 500.0000 mg | ORAL_CAPSULE | Freq: Three times a day (TID) | ORAL | 0 refills | Status: DC

## 2021-03-20 MED ORDER — CEPHALEXIN 500 MG PO CAPS
500.0000 mg | ORAL_CAPSULE | Freq: Once | ORAL | Status: AC
  Administered 2021-03-20: 500 mg via ORAL
  Filled 2021-03-20: qty 1

## 2021-03-20 MED ORDER — ACETAMINOPHEN 325 MG PO TABS
325.0000 mg | ORAL_TABLET | Freq: Once | ORAL | Status: AC
  Administered 2021-03-20: 325 mg via ORAL
  Filled 2021-03-20: qty 1

## 2021-03-20 MED ORDER — CEPHALEXIN 500 MG PO CAPS: 500.0000 mg | ORAL_CAPSULE | Freq: Three times a day (TID) | ORAL | 0 refills | Status: AC

## 2021-03-20 NOTE — ED Provider Notes (Signed)
[facility] DEPT Provider Note   CSN: [id number] Arrival date & time: 03/20/21  1159     History Chief Complaint  Patient presents with   Dysuria    Elaine Rivera is a 75 y.o. female.  The history is provided by the patient.  Dysuria Pain quality:  Burning Pain severity:  Mild Onset quality:  Gradual Duration:  1 day Timing:  Constant Progression:  Unchanged Chronicity:  New Relieved by:  Nothing Worsened by:  Nothing Ineffective treatments:  None tried Urinary symptoms: no frequent urination Elaine no hematuria   Associated symptoms: no abdominal pain, no fever, no nausea Elaine no vomiting       Past Medical History:  Diagnosis Date   Anemia    years ago   Arthritis    Basal cell carcinoma of eye    biopsy of left eye/ non cancerous   Complication of anesthesia    nausea/vomiting   Depression    GERD (gastroesophageal reflux disease)    History of kidney stones    Hypertension    no longer on medications    Patient Active Problem List   Diagnosis Date Noted   Bad headache 03/14/2021   Focal seizure ([facility]) 03/06/2021   CVA (cerebral vascular accident) ([facility]) 03/06/2021   Ischemic cerebrovascular accident (CVA) ([facility]) 03/05/2021   Hypokalemia 03/05/2021   Anxiety 03/05/2021   Transient speech disturbance 03/05/2021   Lung nodule 03/05/2021   Aphasia    Fall    Anemia 02/28/2020   Shortness of breath 01/24/2020   Dizziness 01/24/2020   Herpes 10/04/2018   Cerebral aneurysm 09/05/2018   NSAID long-term use 04/20/2016   Scoliosis 04/20/2016   Depression 04/20/2016   HTN (hypertension) 04/20/2016   GERD (gastroesophageal reflux disease) 04/20/2016   Occult GI bleeding 04/20/2016    Past Surgical History:  Procedure Laterality Date   CEREBRAL ANEURYSM REPAIR     1998   COLONOSCOPY     ECTOPIC PREGNANCY SURGERY     x2    ENTEROSCOPY N/A 03/01/2020   Procedure: ENTEROSCOPY;  Surgeon: Carol Ada, MD;  Location: [facility]  ENDOSCOPY;  Service: Endoscopy;  Laterality: N/A;   ESOPHAGOGASTRODUODENOSCOPY N/A 04/22/2016   Procedure: ESOPHAGOGASTRODUODENOSCOPY (EGD);  Surgeon: Juanita Craver, MD;  Location: [facility] ENDOSCOPY;  Service: Endoscopy;  Laterality: N/A;   IR 3D INDEPENDENT WKST  09/05/2018   IR ANGIO INTRA EXTRACRAN SEL COM CAROTID INNOMINATE BILAT MOD SED  09/05/2018   IR ANGIO VERTEBRAL SEL VERTEBRAL UNI R MOD SED  09/05/2018   IR ANGIOGRAM FOLLOW UP STUDY  09/05/2018   IR TRANSCATH/EMBOLIZ  09/05/2018   PARATHYROIDECTOMY     2002   RADIOLOGY WITH ANESTHESIA N/A 09/05/2018   Procedure: EMBOLIZATION;  Surgeon: Radiologist, Medication, MD;  Location: [facility];  Service: Radiology;  Laterality: N/A;   TONSILLECTOMY     1953   VAGINAL HYSTERECTOMY     2001   VOCAL CORD LATERALIZATION, ENDOSCOPIC APPROACH W/ MLB     2007 (@[facility] )   WRIST SURGERY     left side/2008   WRIST SURGERY     right side/ 2002     OB History   No obstetric history on file.     Family History  Family history unknown: Yes    Social History   Tobacco Use   Smoking status: Never   Smokeless tobacco: Never  Vaping Use   Vaping Use: Never used  Substance Use Topics   Alcohol use: No   Drug use:  Never    Home Medications Prior to Admission medications   Medication Sig Start Date End Date Taking? Authorizing Provider  acetaminophen (TYLENOL) 325 MG tablet Take 650 mg by mouth every 6 (six) hours as needed for mild pain, fever Elaine headache.    [provider]  ALPRAZolam Duanne Moron) 0.5 MG tablet Take 1 tablet (0.5 mg total) by mouth 2 (two) times daily as needed for anxiety. 03/10/21   Mariel Aloe, MD  aspirin EC 81 MG EC tablet Take 1 tablet (81 mg total) by mouth daily. Swallow whole. 03/16/21   Shelda Pal, DO  atorvastatin (LIPITOR) 40 MG tablet Take 1 tablet (40 mg total) by mouth daily before supper. 03/10/21   Mariel Aloe, MD  levETIRAcetam (KEPPRA) 500 MG tablet Take 1 tablet (500 mg total) by mouth 2  (two) times daily. 03/15/21   Shelda Pal, DO  polyvinyl alcohol (LIQUIFILM TEARS) 1.4 % ophthalmic solution Place 2 drops into both eyes in the morning, at noon, Elaine at bedtime.    [provider]  senna-docusate (SENOKOT-S) 8.6-50 MG tablet Take 1 tablet by mouth at bedtime as needed for mild constipation. 03/10/21   Mariel Aloe, MD  temazepam (RESTORIL) 15 MG capsule Take 1-2 capsules (15-30 mg total) by mouth at bedtime as needed for sleep. 03/10/21   Mariel Aloe, MD    Allergies    Sulfa antibiotics  Review of Systems   Review of Systems  Constitutional:  Negative for chills Elaine fever.  HENT:  Negative for ear pain Elaine sore throat.   Eyes:  Negative for pain Elaine visual disturbance.  Respiratory:  Negative for cough Elaine shortness of breath.   Cardiovascular:  Negative for chest pain Elaine palpitations.  Gastrointestinal:  Negative for abdominal pain, nausea Elaine vomiting.  Genitourinary:  Positive for dysuria. Negative for hematuria.  Musculoskeletal:  Positive for myalgias. Negative for arthralgias Elaine back pain.  Skin:  Negative for color change Elaine rash.  Neurological:  Negative for seizures Elaine syncope.  All other systems reviewed Elaine are negative.  Physical Exam Updated Vital Signs BP 132/79   Pulse 61   Temp (!) 97.5 F (36.4 C) (Oral)   Resp 18   Ht 4\' 11"  (1.499 m)   Wt 59 kg   SpO2 96%   BMI 26.27 kg/m   Physical Exam Vitals Elaine nursing note reviewed.  HENT:     Head: Normocephalic Elaine atraumatic.  Eyes:     General: No scleral icterus. Pulmonary:     Effort: Pulmonary effort is normal. No respiratory distress.  Abdominal:     General: There is no distension.     Tenderness: There is no abdominal tenderness. There is no guarding.  Musculoskeletal:     Cervical back: Normal range of motion.  Skin:    General: Skin is warm Elaine dry.  Neurological:     Mental Status: Elaine is alert.  Psychiatric:        Mood Elaine Affect: Mood normal.     ED Results / Procedures / Treatments   Labs (all labs ordered are listed, but only abnormal results are displayed) Labs Reviewed  COMPREHENSIVE METABOLIC PANEL - Abnormal; Notable for the following components:      Result Value   Sodium 134 (*)    Potassium 3.4 (*)    Calcium 8.7 (*)    All other components within normal limits  CBC WITH DIFFERENTIAL/PLATELET - Abnormal; Notable for the following components:  MCH 25.1 (*)    RDW 17.1 (*)    All other components within normal limits  URINALYSIS, ROUTINE W REFLEX MICROSCOPIC - Abnormal; Notable for the following components:   APPearance HAZY (*)    Hgb urine dipstick MODERATE (*)    Bacteria, UA MANY (*)    All other components within normal limits  RESP PANEL BY RT-PCR (FLU A&B, COVID) ARPGX2  URINE CULTURE    EKG None  Radiology No results found.  Procedures Procedures   Medications Ordered in ED Medications  cephALEXin (KEFLEX) capsule 500 mg (500 mg Oral Given 03/20/21 1521)    ED Course  I have reviewed the triage vital signs Elaine the nursing notes.  Pertinent labs & imaging results that were available during my care of the patient were reviewed by me Elaine considered in my medical decision making (see chart for details).    MDM Rules/Calculators/A&P                          Elaine EMILIYA CHRETIEN is well-appearing in general.  Elaine presents with dysuria Elaine some myalgias.  Elaine was evaluated for evidence of UTI Elaine other viral syndrome.  Her urine does appear consistent with infection.  COVID Elaine flu test were negative.  Labs overall are Rivera, Elaine Rivera, Elaine Rivera, Elaine other emergent etiology.  Elaine will be discharged home with antibiotics.  Urine culture is pending. Final Clinical Impression(s) / ED Diagnoses Final diagnoses:  Acute cystitis without hematuria    Rx / DC Orders ED Discharge Orders          Ordered    cephALEXin (KEFLEX) 500 MG capsule   3 times daily,   Status:  Discontinued        03/20/21 1513    cephALEXin (KEFLEX) 500 MG capsule  3 times daily        03/20/21 1514             Arnaldo Natal, MD 03/20/21 1615

## 2021-03-20 NOTE — ED Triage Notes (Signed)
Pt BIBA [facility] from [facility] with c/o dysuria since this morning. Pt has hx of UTI.   Vitals 142/84 HR-66 RR-16 SpO2-98 room air T- 97.2 oral

## 2021-03-20 NOTE — ED Notes (Signed)
[facility] called for transport.  

## 2021-03-20 NOTE — ED Notes (Signed)
Report given to [facility], nurse at [facility]

## 2021-03-23 LAB — URINE CULTURE: Culture: >=100000 — AB

## 2021-03-24 ENCOUNTER — Telehealth: Payer: Self-pay | Admitting: Emergency Medicine

## 2021-03-24 NOTE — Progress Notes (Signed)
ED Antimicrobial Stewardship Positive Culture Follow Up   Elaine Rivera is an 75 y.o. female who presented to [facility] on 03/20/2021 with a chief complaint of dysuria  Chief Complaint  Patient presents with   Dysuria   Recent Results (from the past 720 hour(s))  Resp Panel by RT-PCR (Flu A&B, Covid) Nasopharyngeal Swab     Status: None   Collection Time: 03/05/21  2:13 AM   Specimen: Nasopharyngeal Swab; Nasopharyngeal(NP) swabs in vial transport medium  Result Value Ref Range Status   SARS Coronavirus 2 by RT PCR NEGATIVE NEGATIVE Final    Comment: (NOTE) SARS-CoV-2 target nucleic acids are NOT DETECTED.  The SARS-CoV-2 RNA is generally detectable in upper respiratory specimens during the acute phase of infection. The lowest concentration of SARS-CoV-2 viral copies this assay can detect is 138 copies/mL. A negative result does not preclude SARS-Cov-2 infection and should not be used as the sole basis for treatment or other patient management decisions. A negative result may occur with  improper specimen collection/handling, submission of specimen other than nasopharyngeal swab, presence of viral mutation(s) within the areas targeted by this assay, and inadequate number of viral copies(<138 copies/mL). A negative result must be combined with clinical observations, patient history, and epidemiological information. The expected result is Negative.  Fact Sheet for Patients:  EntrepreneurPulse.com.au  Fact Sheet for Healthcare Providers:  IncredibleEmployment.be  This test is no t yet approved or cleared by the [place] FDA and  has been authorized for detection and/or diagnosis of SARS-CoV-2 by FDA under an Emergency Use Authorization (EUA). This EUA will remain  in effect (meaning this test can be used) for the duration of the COVID-19 declaration under Section 564(b)(1) of the Act, 21 U.S.C.section 360bbb-3(b)(1), unless the  authorization is terminated  or revoked sooner.       Influenza A by PCR NEGATIVE NEGATIVE Final   Influenza B by PCR NEGATIVE NEGATIVE Final    Comment: (NOTE) The Xpert Xpress SARS-CoV-2/FLU/RSV plus assay is intended as an aid in the diagnosis of influenza from Nasopharyngeal swab specimens and should not be used as a sole basis for treatment. Nasal washings and aspirates are unacceptable for Xpert Xpress SARS-CoV-2/FLU/RSV testing.  Fact Sheet for Patients: EntrepreneurPulse.com.au  Fact Sheet for Healthcare Providers: IncredibleEmployment.be  This test is not yet approved or cleared by the [place] FDA and has been authorized for detection and/or diagnosis of SARS-CoV-2 by FDA under an Emergency Use Authorization (EUA). This EUA will remain in effect (meaning this test can be used) for the duration of the COVID-19 declaration under Section 564(b)(1) of the Act, 21 U.S.C. section 360bbb-3(b)(1), unless the authorization is terminated or revoked.  Performed at [facility], [place] [address] [place], [place] [address]   Culture, Urine     Status: Abnormal   Collection Time: 03/06/21  8:42 AM   Specimen: Urine, Random  Result Value Ref Range Status   Specimen Description URINE, RANDOM  Final   Special Requests   Final    NONE Performed at [facility], [place] [address] [place], [place] [address]    Culture MULTIPLE SPECIES PRESENT, SUGGEST RECOLLECTION (A)  Final   Report Status 03/07/2021 FINAL  Final  SARS CORONAVIRUS 2 (TAT 6-24 HRS) Nasopharyngeal Nasopharyngeal Swab     Status: None   Collection Time: 03/09/21 10:12 AM   Specimen: Nasopharyngeal Swab  Result Value Ref Range Status   SARS Coronavirus 2 NEGATIVE NEGATIVE Final  Comment: (NOTE) SARS-CoV-2 target nucleic acids are NOT DETECTED.  The SARS-CoV-2 RNA is generally detectable in upper and lower respiratory specimens during the acute phase  of infection. Negative results do not preclude SARS-CoV-2 infection, do not rule out co-infections with other pathogens, and should not be used as the sole basis for treatment or other patient management decisions. Negative results must be combined with clinical observations, patient history, and epidemiological information. The expected result is Negative.  Fact Sheet for Patients: SugarRoll.be  Fact Sheet for Healthcare Providers: https://www.woods-mathews.com/  This test is not yet approved or cleared by the [place] FDA and  has been authorized for detection and/or diagnosis of SARS-CoV-2 by FDA under an Emergency Use Authorization (EUA). This EUA will remain  in effect (meaning this test can be used) for the duration of the COVID-19 declaration under Se ction 564(b)(1) of the Act, 21 U.S.C. section 360bbb-3(b)(1), unless the authorization is terminated or revoked sooner.  Performed at [facility], [place] [address], [place], [place] [address]   Resp Panel by RT-PCR (Flu A&B, Covid) Nasopharyngeal Swab     Status: None   Collection Time: 03/14/21  2:26 AM   Specimen: Nasopharyngeal Swab; Nasopharyngeal(NP) swabs in vial transport medium  Result Value Ref Range Status   SARS Coronavirus 2 by RT PCR NEGATIVE NEGATIVE Final    Comment: (NOTE) SARS-CoV-2 target nucleic acids are NOT DETECTED.  The SARS-CoV-2 RNA is generally detectable in upper respiratory specimens during the acute phase of infection. The lowest concentration of SARS-CoV-2 viral copies this assay can detect is 138 copies/mL. A negative result does not preclude SARS-Cov-2 infection and should not be used as the sole basis for treatment or other patient management decisions. A negative result may occur with  improper specimen collection/handling, submission of specimen other than nasopharyngeal swab, presence of viral mutation(s) within the areas targeted by  this assay, and inadequate number of viral copies(<138 copies/mL). A negative result must be combined with clinical observations, patient history, and epidemiological information. The expected result is Negative.  Fact Sheet for Patients:  EntrepreneurPulse.com.au  Fact Sheet for Healthcare Providers:  IncredibleEmployment.be  This test is no t yet approved or cleared by the [place] FDA and  has been authorized for detection and/or diagnosis of SARS-CoV-2 by FDA under an Emergency Use Authorization (EUA). This EUA will remain  in effect (meaning this test can be used) for the duration of the COVID-19 declaration under Section 564(b)(1) of the Act, 21 U.S.C.section 360bbb-3(b)(1), unless the authorization is terminated  or revoked sooner.       Influenza A by PCR NEGATIVE NEGATIVE Final   Influenza B by PCR NEGATIVE NEGATIVE Final    Comment: (NOTE) The Xpert Xpress SARS-CoV-2/FLU/RSV plus assay is intended as an aid in the diagnosis of influenza from Nasopharyngeal swab specimens and should not be used as a sole basis for treatment. Nasal washings and aspirates are unacceptable for Xpert Xpress SARS-CoV-2/FLU/RSV testing.  Fact Sheet for Patients: EntrepreneurPulse.com.au  Fact Sheet for Healthcare Providers: IncredibleEmployment.be  This test is not yet approved or cleared by the [place] FDA and has been authorized for detection and/or diagnosis of SARS-CoV-2 by FDA under an Emergency Use Authorization (EUA). This EUA will remain in effect (meaning this test can be used) for the duration of the COVID-19 declaration under Section 564(b)(1) of the Act, 21 U.S.C. section 360bbb-3(b)(1), unless the authorization is terminated or revoked.  Performed at [facility], [place] [address]  [place], [place] [address]   CSF culture w Gram Stain     Status: None   Collection Time: 03/14/21 12:46  PM   Specimen: PATH Cytology CSF; Cerebrospinal Fluid  Result Value Ref Range Status   Specimen Description CSF  Final   Special Requests NONE  Final   Gram Stain NO WBC SEEN NO ORGANISMS SEEN CYTOSPIN SMEAR   Final   Culture   Final    NO GROWTH 3 DAYS Performed at [facility], [address] [place], [place] [address]    Report Status 03/17/2021 FINAL  Final  Surgical pcr screen     Status: None   Collection Time: 03/14/21  4:58 PM   Specimen: Nasal Mucosa; Nasal Swab  Result Value Ref Range Status   MRSA, PCR NEGATIVE NEGATIVE Final   Staphylococcus aureus NEGATIVE NEGATIVE Final    Comment: (NOTE) The Xpert SA Assay (FDA approved for NASAL specimens in patients 38 years of age and older), is one component of a comprehensive surveillance program. It is not intended to diagnose infection nor to guide or monitor treatment. Performed at [facility], [place] [address], [place], [place] [address]   Resp Panel by RT-PCR (Flu A&B, Covid) Urine, Clean Catch     Status: None   Collection Time: 03/20/21  2:05 PM   Specimen: Urine, Clean Catch; Nasopharyngeal(NP) swabs in vial transport medium  Result Value Ref Range Status   SARS Coronavirus 2 by RT PCR NEGATIVE NEGATIVE Final    Comment: (NOTE) SARS-CoV-2 target nucleic acids are NOT DETECTED.  The SARS-CoV-2 RNA is generally detectable in upper respiratory specimens during the acute phase of infection. The lowest concentration of SARS-CoV-2 viral copies this assay can detect is 138 copies/mL. A negative result does not preclude SARS-Cov-2 infection and should not be used as the sole basis for treatment or other patient management decisions. A negative result may occur with  improper specimen collection/handling, submission of specimen other than nasopharyngeal swab, presence of viral mutation(s) within the areas targeted by this assay, and inadequate number of viral copies(<138 copies/mL). A negative result must be  combined with clinical observations, patient history, and epidemiological information. The expected result is Negative.  Fact Sheet for Patients:  EntrepreneurPulse.com.au  Fact Sheet for Healthcare Providers:  IncredibleEmployment.be  This test is no t yet approved or cleared by the [place] FDA and  has been authorized for detection and/or diagnosis of SARS-CoV-2 by FDA under an Emergency Use Authorization (EUA). This EUA will remain  in effect (meaning this test can be used) for the duration of the COVID-19 declaration under Section 564(b)(1) of the Act, 21 U.S.C.section 360bbb-3(b)(1), unless the authorization is terminated  or revoked sooner.       Influenza A by PCR NEGATIVE NEGATIVE Final   Influenza B by PCR NEGATIVE NEGATIVE Final    Comment: (NOTE) The Xpert Xpress SARS-CoV-2/FLU/RSV plus assay is intended as an aid in the diagnosis of influenza from Nasopharyngeal swab specimens and should not be used as a sole basis for treatment. Nasal washings and aspirates are unacceptable for Xpert Xpress SARS-CoV-2/FLU/RSV testing.  Fact Sheet for Patients: EntrepreneurPulse.com.au  Fact Sheet for Healthcare Providers: IncredibleEmployment.be  This test is not yet approved or cleared by the [place] FDA and has been authorized for detection and/or diagnosis of SARS-CoV-2 by FDA under an Emergency Use Authorization (EUA). This EUA will remain in effect (meaning this test can be used) for the duration of the COVID-19 declaration  under Section 564(b)(1) of the Act, 21 U.S.C. section 360bbb-3(b)(1), unless the authorization is terminated or revoked.  Performed at [facility], [place] [address], [place], [place] [address]   Urine culture     Status: Abnormal   Collection Time: 03/20/21  3:19 PM   Specimen: Urine, Clean Catch  Result Value Ref Range Status   Specimen  Description   Final    URINE, CLEAN CATCH Performed at [facility], [place] [address] [place], [place] [address]    Special Requests   Final    NONE Performed at [facility], [place] [address] [place], [place] [address]    Culture >=100,000 COLONIES/mL ENTEROCOCCUS FAECALIS (A)  Final   Report Status 03/23/2021 FINAL  Final   Organism ID, Bacteria ENTEROCOCCUS FAECALIS (A)  Final      Susceptibility   Enterococcus faecalis - MIC*    AMPICILLIN <=2 SENSITIVE Sensitive     NITROFURANTOIN <=16 SENSITIVE Sensitive     VANCOMYCIN 2 SENSITIVE Sensitive     * >=100,000 COLONIES/mL ENTEROCOCCUS FAECALIS   75 YO F presented to the ED via EMS from [facility] with complaints of dysuria and myalgias. She denied frequent urination, hematuria, abdominal pain, fever, and nausea/vomiting. She was discharged on Keflex 500 mg TID x 7 days though her culture grew >100,000 colonies of Enterococcus Faecalis which is not covered by Keflex.   Plan: Stop Keflex, Start Amoxicillin 500 mg TID x 5 days  ED Provider: Eustaquio Maize, PA-C  Darcel Smalling, Student Pharmacist 03/24/2021, 8:08 AM

## 2021-03-24 NOTE — Telephone Encounter (Signed)
Post ED Visit - Positive Culture Follow-up: Successful Patient Follow-Up  Culture assessed and recommendations reviewed by:  []  Elenor Quinones, Pharm.D. []  Heide Guile, Pharm.D., BCPS AQ-ID []  Parks Neptune, Pharm.D., BCPS []  Alycia Rossetti, Pharm.D., BCPS []  [place], Pharm.D., BCPS, AAHIVP []  Legrand Como, Pharm.D., BCPS, AAHIVP []  Salome Arnt, PharmD, BCPS []  Johnnette Gourd, PharmD, BCPS []  Hughes Better, PharmD, BCPS []  Leeroy Cha, PharmD  Positive urine culture  []  Patient discharged without antimicrobial prescription and treatment is now indicated [x]  Organism is resistant to prescribed ED discharge antimicrobial []  Patient with positive blood cultures  Changes discussed with ED provider: Eustaquio Maize PA New antibiotic prescription stop keflex, start amoxicillin 500mg  po tid x 5 days Urine culture report with change in treatment faxed to [facility] @ [phone]  Faxed @ 03/24/2021 [place] 03/24/2021, 9:38 AM

## 2021-03-27 ENCOUNTER — Other Ambulatory Visit: Payer: Self-pay

## 2021-03-27 ENCOUNTER — Ambulatory Visit (HOSPITAL_COMMUNITY)
Admission: RE | Admit: 2021-03-27 | Discharge: 2021-03-27 | Disposition: A | Discharge status: Home / Self Care | Payer: PPO | Source: Ambulatory Visit | Attending: Interventional Radiology | Admitting: Interventional Radiology

## 2021-03-27 DIAGNOSIS — N390 Urinary tract infection, site not specified: Secondary | ICD-10-CM | POA: Diagnosis not present

## 2021-03-27 DIAGNOSIS — I671 Cerebral aneurysm, nonruptured: Secondary | ICD-10-CM | POA: Diagnosis not present

## 2021-03-28 HISTORY — PX: IR RADIOLOGIST EVAL & MGMT: IMG5224

## 2021-04-02 DIAGNOSIS — I69393 Ataxia following cerebral infarction: Secondary | ICD-10-CM | POA: Diagnosis not present

## 2021-04-02 DIAGNOSIS — I671 Cerebral aneurysm, nonruptured: Secondary | ICD-10-CM | POA: Diagnosis not present

## 2021-04-02 DIAGNOSIS — I69315 Cognitive social or emotional deficit following cerebral infarction: Secondary | ICD-10-CM | POA: Diagnosis not present

## 2021-04-02 DIAGNOSIS — I10 Essential (primary) hypertension: Secondary | ICD-10-CM | POA: Diagnosis not present

## 2021-04-02 DIAGNOSIS — Z7982 Long term (current) use of aspirin: Secondary | ICD-10-CM | POA: Diagnosis not present

## 2021-04-02 DIAGNOSIS — R569 Unspecified convulsions: Secondary | ICD-10-CM | POA: Diagnosis not present

## 2021-04-02 DIAGNOSIS — F419 Anxiety disorder, unspecified: Secondary | ICD-10-CM | POA: Diagnosis not present

## 2021-04-02 DIAGNOSIS — R911 Solitary pulmonary nodule: Secondary | ICD-10-CM | POA: Diagnosis not present

## 2021-04-02 DIAGNOSIS — Z9181 History of falling: Secondary | ICD-10-CM | POA: Diagnosis not present

## 2021-04-02 DIAGNOSIS — F32A Depression, unspecified: Secondary | ICD-10-CM | POA: Diagnosis not present

## 2021-04-16 DIAGNOSIS — Z9181 History of falling: Secondary | ICD-10-CM | POA: Diagnosis not present

## 2021-04-16 DIAGNOSIS — Z7982 Long term (current) use of aspirin: Secondary | ICD-10-CM | POA: Diagnosis not present

## 2021-04-16 DIAGNOSIS — F32A Depression, unspecified: Secondary | ICD-10-CM | POA: Diagnosis not present

## 2021-04-16 DIAGNOSIS — I69393 Ataxia following cerebral infarction: Secondary | ICD-10-CM | POA: Diagnosis not present

## 2021-04-16 DIAGNOSIS — I10 Essential (primary) hypertension: Secondary | ICD-10-CM | POA: Diagnosis not present

## 2021-04-16 DIAGNOSIS — R569 Unspecified convulsions: Secondary | ICD-10-CM | POA: Diagnosis not present

## 2021-04-16 DIAGNOSIS — I69315 Cognitive social or emotional deficit following cerebral infarction: Secondary | ICD-10-CM | POA: Diagnosis not present

## 2021-04-16 DIAGNOSIS — R911 Solitary pulmonary nodule: Secondary | ICD-10-CM | POA: Diagnosis not present

## 2021-04-16 DIAGNOSIS — I671 Cerebral aneurysm, nonruptured: Secondary | ICD-10-CM | POA: Diagnosis not present

## 2021-04-16 DIAGNOSIS — F419 Anxiety disorder, unspecified: Secondary | ICD-10-CM | POA: Diagnosis not present

## 2021-04-23 ENCOUNTER — Other Ambulatory Visit (INDEPENDENT_AMBULATORY_CARE_PROVIDER_SITE_OTHER): Payer: Self-pay

## 2021-08-22 ENCOUNTER — Inpatient Hospital Stay (HOSPITAL_COMMUNITY)
Admission: EM | Admit: 2021-08-22 | Discharge: 2021-08-28 | DRG: 640 | Disposition: A | Discharge status: Skilled Nursing Facility | Payer: PPO | Attending: Internal Medicine | Admitting: Internal Medicine

## 2021-08-22 ENCOUNTER — Encounter (HOSPITAL_COMMUNITY): Payer: Self-pay | Admitting: Emergency Medicine

## 2021-08-22 ENCOUNTER — Other Ambulatory Visit: Payer: Self-pay

## 2021-08-22 ENCOUNTER — Emergency Department (HOSPITAL_COMMUNITY): Payer: PPO

## 2021-08-22 DIAGNOSIS — I639 Cerebral infarction, unspecified: Secondary | ICD-10-CM | POA: Diagnosis present

## 2021-08-22 DIAGNOSIS — Z87442 Personal history of urinary calculi: Secondary | ICD-10-CM | POA: Diagnosis not present

## 2021-08-22 DIAGNOSIS — F32A Depression, unspecified: Secondary | ICD-10-CM | POA: Diagnosis present

## 2021-08-22 DIAGNOSIS — R911 Solitary pulmonary nodule: Secondary | ICD-10-CM | POA: Diagnosis present

## 2021-08-22 DIAGNOSIS — R5381 Other malaise: Secondary | ICD-10-CM | POA: Diagnosis present

## 2021-08-22 DIAGNOSIS — E43 Unspecified severe protein-calorie malnutrition: Secondary | ICD-10-CM | POA: Diagnosis not present

## 2021-08-22 DIAGNOSIS — G8929 Other chronic pain: Secondary | ICD-10-CM | POA: Diagnosis not present

## 2021-08-22 DIAGNOSIS — G40909 Epilepsy, unspecified, not intractable, without status epilepticus: Secondary | ICD-10-CM | POA: Diagnosis present

## 2021-08-22 DIAGNOSIS — S2231XA Fracture of one rib, right side, initial encounter for closed fracture: Secondary | ICD-10-CM | POA: Diagnosis present

## 2021-08-22 DIAGNOSIS — E876 Hypokalemia: Secondary | ICD-10-CM | POA: Diagnosis not present

## 2021-08-22 DIAGNOSIS — Z602 Problems related to living alone: Secondary | ICD-10-CM | POA: Diagnosis present

## 2021-08-22 DIAGNOSIS — F329 Major depressive disorder, single episode, unspecified: Secondary | ICD-10-CM | POA: Diagnosis present

## 2021-08-22 DIAGNOSIS — I10 Essential (primary) hypertension: Secondary | ICD-10-CM | POA: Diagnosis present

## 2021-08-22 DIAGNOSIS — Z681 Body mass index (BMI) 19 or less, adult: Secondary | ICD-10-CM

## 2021-08-22 DIAGNOSIS — S199XXA Unspecified injury of neck, initial encounter: Secondary | ICD-10-CM | POA: Diagnosis not present

## 2021-08-22 DIAGNOSIS — S2239XA Fracture of one rib, unspecified side, initial encounter for closed fracture: Secondary | ICD-10-CM

## 2021-08-22 DIAGNOSIS — M545 Low back pain, unspecified: Secondary | ICD-10-CM | POA: Diagnosis not present

## 2021-08-22 DIAGNOSIS — M419 Scoliosis, unspecified: Secondary | ICD-10-CM | POA: Diagnosis not present

## 2021-08-22 DIAGNOSIS — M4126 Other idiopathic scoliosis, lumbar region: Secondary | ICD-10-CM | POA: Diagnosis not present

## 2021-08-22 DIAGNOSIS — R262 Difficulty in walking, not elsewhere classified: Secondary | ICD-10-CM | POA: Diagnosis not present

## 2021-08-22 DIAGNOSIS — R296 Repeated falls: Secondary | ICD-10-CM | POA: Diagnosis not present

## 2021-08-22 DIAGNOSIS — R627 Adult failure to thrive: Secondary | ICD-10-CM

## 2021-08-22 DIAGNOSIS — R569 Unspecified convulsions: Secondary | ICD-10-CM

## 2021-08-22 DIAGNOSIS — M5136 Other intervertebral disc degeneration, lumbar region: Secondary | ICD-10-CM | POA: Diagnosis not present

## 2021-08-22 DIAGNOSIS — I517 Cardiomegaly: Secondary | ICD-10-CM | POA: Diagnosis not present

## 2021-08-22 DIAGNOSIS — Z20822 Contact with and (suspected) exposure to covid-19: Secondary | ICD-10-CM | POA: Diagnosis not present

## 2021-08-22 DIAGNOSIS — Z8673 Personal history of transient ischemic attack (TIA), and cerebral infarction without residual deficits: Secondary | ICD-10-CM

## 2021-08-22 DIAGNOSIS — W19XXXA Unspecified fall, initial encounter: Secondary | ICD-10-CM | POA: Diagnosis present

## 2021-08-22 DIAGNOSIS — I671 Cerebral aneurysm, nonruptured: Secondary | ICD-10-CM | POA: Diagnosis present

## 2021-08-22 DIAGNOSIS — Z043 Encounter for examination and observation following other accident: Secondary | ICD-10-CM | POA: Diagnosis not present

## 2021-08-22 LAB — URINALYSIS, ROUTINE W REFLEX MICROSCOPIC
Bilirubin Urine: NEGATIVE
Glucose, UA: NEGATIVE mg/dL
Ketones, ur: NEGATIVE mg/dL
Nitrite: NEGATIVE
Protein, ur: NEGATIVE mg/dL
Specific Gravity, Urine: 1.012 (ref 1.005–1.030)
pH: 6 (ref 5.0–8.0)

## 2021-08-22 LAB — COMPREHENSIVE METABOLIC PANEL
ALT: 13 U/L (ref 0–44)
AST: 21 U/L (ref 15–41)
Albumin: 3.5 g/dL (ref 3.5–5.0)
Alkaline Phosphatase: 79 U/L (ref 38–126)
Anion gap: 9 (ref 5–15)
BUN: 10 mg/dL (ref 8–23)
CO2: 32 mmol/L (ref 22–32)
Calcium: 9.3 mg/dL (ref 8.9–10.3)
Chloride: 99 mmol/L (ref 98–111)
Creatinine, Ser: 0.61 mg/dL (ref 0.44–1.00)
GFR, Estimated: >60 mL/min (ref >60)
Glucose, Bld: 123 mg/dL — ABNORMAL HIGH (ref 70–99)
Potassium: 2.4 mmol/L — CL (ref 3.5–5.1)
Sodium: 140 mmol/L (ref 135–145)
Total Bilirubin: 0.6 mg/dL (ref 0.3–1.2)
Total Protein: 6.9 g/dL (ref 6.5–8.1)

## 2021-08-22 LAB — RESP PANEL BY RT-PCR (FLU A&B, COVID) ARPGX2
Influenza A by PCR: NEGATIVE
Influenza B by PCR: NEGATIVE
SARS Coronavirus 2 by RT PCR: NEGATIVE

## 2021-08-22 LAB — CBC WITH DIFFERENTIAL/PLATELET
Abs Immature Granulocytes: 0.05 10*3/uL (ref 0.00–0.07)
Basophils Absolute: 0 10*3/uL (ref 0.0–0.1)
Basophils Relative: 0 %
Eosinophils Absolute: 0.1 10*3/uL (ref 0.0–0.5)
Eosinophils Relative: 1 %
HCT: 46.7 % — ABNORMAL HIGH (ref 36.0–46.0)
Hemoglobin: 15.4 g/dL — ABNORMAL HIGH (ref 12.0–15.0)
Immature Granulocytes: 1 %
Lymphocytes Relative: 13 %
Lymphs Abs: 1.4 10*3/uL (ref 0.7–4.0)
MCH: 28 pg (ref 26.0–34.0)
MCHC: 33 g/dL (ref 30.0–36.0)
MCV: 84.9 fL (ref 80.0–100.0)
Monocytes Absolute: 0.6 10*3/uL (ref 0.1–1.0)
Monocytes Relative: 5 %
Neutro Abs: 8.7 10*3/uL — ABNORMAL HIGH (ref 1.7–7.7)
Neutrophils Relative %: 80 %
Platelets: 325 10*3/uL (ref 150–400)
RBC: 5.5 MIL/uL — ABNORMAL HIGH (ref 3.87–5.11)
RDW: 13.1 % (ref 11.5–15.5)
WBC: 10.8 10*3/uL — ABNORMAL HIGH (ref 4.0–10.5)
nRBC: 0 % (ref 0.0–0.2)

## 2021-08-22 LAB — CK: Total CK: 82 U/L (ref 38–234)

## 2021-08-22 LAB — MAGNESIUM: Magnesium: 1.9 mg/dL (ref 1.7–2.4)

## 2021-08-22 MED ORDER — ONDANSETRON HCL 4 MG PO TABS: 4.0000 mg | ORAL_TABLET | Freq: Four times a day (QID) | ORAL | Status: DC | PRN

## 2021-08-22 MED ORDER — TRAMADOL HCL 50 MG PO TABS
50.0000 mg | ORAL_TABLET | Freq: Four times a day (QID) | ORAL | Status: DC | PRN
  Administered 2021-08-23 – 2021-08-28 (×6): 50 mg via ORAL
  Filled 2021-08-22 (×6): qty 1

## 2021-08-22 MED ORDER — SODIUM CHLORIDE 0.9 % IV BOLUS
500.0000 mL | Freq: Once | INTRAVENOUS | Status: AC
Start: 2021-08-22 — End: 2021-08-22
  Administered 2021-08-22: 500 mL via INTRAVENOUS

## 2021-08-22 MED ORDER — ACETAMINOPHEN 650 MG RE SUPP: 650.0000 mg | Freq: Four times a day (QID) | RECTAL | Status: DC | PRN

## 2021-08-22 MED ORDER — POTASSIUM CHLORIDE CRYS ER 20 MEQ PO TBCR
40.0000 meq | EXTENDED_RELEASE_TABLET | Freq: Once | ORAL | Status: AC
  Administered 2021-08-22: 40 meq via ORAL
  Filled 2021-08-22: qty 2

## 2021-08-22 MED ORDER — ENOXAPARIN SODIUM 40 MG/0.4ML IJ SOSY
40.0000 mg | PREFILLED_SYRINGE | INTRAMUSCULAR | Status: DC
  Administered 2021-08-23 – 2021-08-28 (×6): 40 mg via SUBCUTANEOUS
  Filled 2021-08-22 (×6): qty 0.4

## 2021-08-22 MED ORDER — LACTATED RINGERS IV SOLN: INTRAVENOUS | Status: DC

## 2021-08-22 MED ORDER — ALPRAZOLAM 0.5 MG PO TABS
0.5000 mg | ORAL_TABLET | Freq: Two times a day (BID) | ORAL | Status: DC | PRN
  Administered 2021-08-23: 0.5 mg via ORAL
  Filled 2021-08-22: qty 1

## 2021-08-22 MED ORDER — HYDROCODONE-ACETAMINOPHEN 5-325 MG PO TABS
1.0000 | ORAL_TABLET | Freq: Once | ORAL | Status: AC
Start: 2021-08-22 — End: 2021-08-22
  Administered 2021-08-22: 1 via ORAL
  Filled 2021-08-22: qty 1

## 2021-08-22 MED ORDER — ONDANSETRON HCL 4 MG/2ML IJ SOLN
4.0000 mg | Freq: Four times a day (QID) | INTRAMUSCULAR | Status: DC | PRN
  Administered 2021-08-23: 4 mg via INTRAVENOUS
  Filled 2021-08-22: qty 2

## 2021-08-22 MED ORDER — LEVETIRACETAM 500 MG PO TABS
500.0000 mg | ORAL_TABLET | Freq: Two times a day (BID) | ORAL | Status: DC
  Administered 2021-08-23 – 2021-08-28 (×12): 500 mg via ORAL
  Filled 2021-08-22 (×12): qty 1

## 2021-08-22 MED ORDER — ACETAMINOPHEN 325 MG PO TABS: 650.0000 mg | ORAL_TABLET | Freq: Four times a day (QID) | ORAL | Status: DC | PRN

## 2021-08-22 MED ORDER — MIRTAZAPINE 7.5 MG PO TABS
15.0000 mg | ORAL_TABLET | Freq: Every day | ORAL | Status: DC
  Administered 2021-08-23: 15 mg via ORAL
  Filled 2021-08-22: qty 2

## 2021-08-22 MED ORDER — SENNOSIDES-DOCUSATE SODIUM 8.6-50 MG PO TABS: 1.0000 | ORAL_TABLET | Freq: Every evening | ORAL | Status: DC | PRN

## 2021-08-22 NOTE — ED Provider Notes (Signed)
Lab called.  Patient with significant hyponatremia at 2.4.  Nursing notified patient needs room in back.  We will add on magnesium level.   Nettie Elm, PA-C 08/22/21 1820    Hayden Rasmussen, MD 08/23/21 1019

## 2021-08-22 NOTE — ED Provider Notes (Signed)
[facility] DEPT Provider Note   CSN: [id number] Arrival date & time: 08/22/21  1503     History Chief Complaint  Patient presents with   Failure To [place] is a 75 y.o. female.  She is brought in by her daughter for failure to thrive frequent falls poor nutrition generalized weakness.  Daughter is seen a decline in the patient since her return from rehabilitation.  Patient lives alone.  Now essentially unable to ambulate, sits on the couch.  Today daughter found her on the couch covered in feces and urine.  She is not sure the patient has been eating much because she cannot ambulate to the kitchen.  Patient endorses back pain chronic from her scoliosis.  She is not on any medication and daughter states she is not taking her routine medications.  She has had home services in the past but patient is fired them.  The history is provided by the patient and a relative.  Weakness Severity:  Severe Onset quality:  Gradual Duration: Months. Timing:  Constant Progression:  Worsening Chronicity:  New Relieved by:  Nothing Worsened by:  Activity Ineffective treatments:  Rest Associated symptoms: cough, difficulty walking and falls   Associated symptoms: no abdominal pain, no chest pain, no dizziness, no dysuria, no numbness in extremities, no fever, no foul-smelling urine, no headaches, no nausea, no shortness of breath and no vomiting   Risk factors: neurologic disease       Past Medical History:  Diagnosis Date   Anemia    years ago   Arthritis    Basal cell carcinoma of eye    biopsy of left eye/ non cancerous   Complication of anesthesia    nausea/vomiting   Depression    GERD (gastroesophageal reflux disease)    History of kidney stones    Hypertension    no longer on medications    Patient Active Problem List   Diagnosis Date Noted   Bad headache 03/14/2021   Focal seizure ([place]) 03/06/2021   CVA (cerebral vascular  accident) ([place]) 03/06/2021   Ischemic cerebrovascular accident (CVA) ([place]) 03/05/2021   Hypokalemia 03/05/2021   Anxiety 03/05/2021   Transient speech disturbance 03/05/2021   Lung nodule 03/05/2021   Aphasia    Fall    Anemia 02/28/2020   Shortness of breath 01/24/2020   Dizziness 01/24/2020   Herpes 10/04/2018   Cerebral aneurysm 09/05/2018   NSAID long-term use 04/20/2016   Scoliosis 04/20/2016   Depression 04/20/2016   HTN (hypertension) 04/20/2016   GERD (gastroesophageal reflux disease) 04/20/2016   Occult GI bleeding 04/20/2016    Past Surgical History:  Procedure Laterality Date   CEREBRAL ANEURYSM REPAIR     1998   COLONOSCOPY     ECTOPIC PREGNANCY SURGERY     x2    ENTEROSCOPY N/A 03/01/2020   Procedure: ENTEROSCOPY;  Surgeon: Carol Ada, MD;  Location: [facility];  Service: Endoscopy;  Laterality: N/A;   ESOPHAGOGASTRODUODENOSCOPY N/A 04/22/2016   Procedure: ESOPHAGOGASTRODUODENOSCOPY (EGD);  Surgeon: Juanita Craver, MD;  Location: [facility];  Service: Endoscopy;  Laterality: N/A;   IR 3D INDEPENDENT WKST  09/05/2018   IR ANGIO INTRA EXTRACRAN SEL COM CAROTID INNOMINATE BILAT MOD SED  09/05/2018   IR ANGIO VERTEBRAL SEL VERTEBRAL UNI R MOD SED  09/05/2018   IR ANGIOGRAM FOLLOW UP STUDY  09/05/2018   IR RADIOLOGIST EVAL & MGMT  03/28/2021   IR TRANSCATH/EMBOLIZ  09/05/2018   PARATHYROIDECTOMY  2002   RADIOLOGY WITH ANESTHESIA N/A 09/05/2018   Procedure: EMBOLIZATION;  Surgeon: Radiologist, Medication, MD;  Location: [facility];  Service: Radiology;  Laterality: N/A;   TONSILLECTOMY     1953   VAGINAL HYSTERECTOMY     2001   VOCAL CORD LATERALIZATION, ENDOSCOPIC APPROACH W/ MLB     2007 (@[facility] )   WRIST SURGERY     left side/2008   WRIST SURGERY     right side/ 2002     OB History   No obstetric history on file.     Family History  Family history unknown: Yes    Social History   Tobacco Use   Smoking status: Never   Smokeless tobacco: Never  Vaping  Use   Vaping Use: Never used  Substance Use Topics   Alcohol use: No   Drug use: Never    Home Medications Prior to Admission medications   Medication Sig Start Date End Date Taking? Authorizing Provider  acetaminophen (TYLENOL) 325 MG tablet Take 650 mg by mouth every 6 (six) hours as needed for mild pain, fever or headache.    [provider]  ALPRAZolam Duanne Moron) 0.5 MG tablet Take 1 tablet (0.5 mg total) by mouth 2 (two) times daily as needed for anxiety. 03/10/21   Mariel Aloe, MD  aspirin EC 81 MG EC tablet Take 1 tablet (81 mg total) by mouth daily. Swallow whole. 03/16/21   Shelda Pal, DO  atorvastatin (LIPITOR) 40 MG tablet Take 1 tablet (40 mg total) by mouth daily before supper. 03/10/21   Mariel Aloe, MD  levETIRAcetam (KEPPRA) 500 MG tablet Take 1 tablet (500 mg total) by mouth 2 (two) times daily. 03/15/21   Shelda Pal, DO  polyvinyl alcohol (LIQUIFILM TEARS) 1.4 % ophthalmic solution Place 2 drops into both eyes in the morning, at noon, and at bedtime.    [provider]  senna-docusate (SENOKOT-S) 8.6-50 MG tablet Take 1 tablet by mouth at bedtime as needed for mild constipation. 03/10/21   Mariel Aloe, MD  temazepam (RESTORIL) 15 MG capsule Take 1-2 capsules (15-30 mg total) by mouth at bedtime as needed for sleep. 03/10/21   Mariel Aloe, MD    Allergies    Sulfa antibiotics  Review of Systems   Review of Systems  Constitutional:  Negative for fever.  HENT:  Negative for sore throat.   Eyes:  Negative for visual disturbance.  Respiratory:  Positive for cough. Negative for shortness of breath.   Cardiovascular:  Negative for chest pain.  Gastrointestinal:  Negative for abdominal pain, nausea and vomiting.  Genitourinary:  Negative for dysuria.  Musculoskeletal:  Positive for back pain, falls and gait problem.  Skin:  Negative for rash.  Neurological:  Positive for weakness. Negative for dizziness and headaches.    Physical Exam Updated Vital Signs BP 123/87   Pulse 76   Temp 98.3 F (36.8 C) (Oral)   Resp 16   SpO2 96%   Physical Exam Vitals and nursing note reviewed.  Constitutional:      General: She is not in acute distress.    Appearance: She is well-developed. She is cachectic.  HENT:     Head: Normocephalic and atraumatic.  Eyes:     Conjunctiva/sclera: Conjunctivae normal.  Cardiovascular:     Rate and Rhythm: Normal rate and regular rhythm.     Heart sounds: No murmur heard. Pulmonary:     Effort: Pulmonary effort is normal. No respiratory distress.  Breath sounds: Normal breath sounds.  Abdominal:     Palpations: Abdomen is soft.     Tenderness: There is no abdominal tenderness. There is no guarding or rebound.  Musculoskeletal:        General: Tenderness present. No swelling, deformity or signs of injury. Normal range of motion.     Cervical back: Neck supple.     Comments: Thoracic and lumbar spine  Skin:    General: Skin is warm and dry.     Capillary Refill: Capillary refill takes less than 2 seconds.  Neurological:     General: No focal deficit present.     Mental Status: She is alert and oriented to person, place, and time.     Cranial Nerves: No cranial nerve deficit.     Sensory: No sensory deficit.     Motor: No weakness.  Psychiatric:        Mood and Affect: Mood normal.    ED Results / Procedures / Treatments   Labs (all labs ordered are listed, but only abnormal results are displayed) Labs Reviewed  CBC WITH DIFFERENTIAL/PLATELET - Abnormal; Notable for the following components:      Result Value   WBC 10.8 (*)    RBC 5.50 (*)    Hemoglobin 15.4 (*)    HCT 46.7 (*)    Neutro Abs 8.7 (*)    All other components within normal limits  COMPREHENSIVE METABOLIC PANEL - Abnormal; Notable for the following components:   Potassium 2.4 (*)    Glucose, Bld 123 (*)    All other components within normal limits  URINALYSIS, ROUTINE W REFLEX MICROSCOPIC  - Abnormal; Notable for the following components:   APPearance HAZY (*)    Hgb urine dipstick SMALL (*)    Leukocytes,Ua TRACE (*)    Bacteria, UA RARE (*)    All other components within normal limits  COMPREHENSIVE METABOLIC PANEL - Abnormal; Notable for the following components:   Potassium 3.1 (*)    BUN 7 (*)    Calcium 8.6 (*)    Total Protein 5.6 (*)    Albumin 2.9 (*)    All other components within normal limits  RESP PANEL BY RT-PCR (FLU A&B, COVID) ARPGX2  CK  MAGNESIUM  CBC  TSH  FOLATE  CORTISOL-AM, BLOOD  VITAMIN B12  VITAMIN B1  T4, FREE    EKG EKG Interpretation  Date/Time:  Friday August 22 2021 18:38:03 EST Ventricular Rate:  70 PR Interval:  157 QRS Duration: 72 QT Interval:  391 QTC Calculation: 422 R Axis:   -76 Text Interpretation: Sinus rhythm Left anterior fascicular block Abnormal R-wave progression, early transition Consider left ventricular hypertrophy Borderline T abnormalities, anterior leads No significant change since prior 6/22 Confirmed by Aletta Edouard [phone]) on 08/22/2021 6:44:03 PM  Radiology DG Chest 2 View  Result Date: 08/22/2021 CLINICAL DATA:  Fall. EXAM: CHEST - 2 VIEW COMPARISON:  Chest CT dated 03/06/2021. FINDINGS: No focal consolidation, pleural effusion, pneumothorax. Mild cardiomegaly. Atherosclerotic calcification of the aorta. Osteopenia with scoliosis and degenerative changes of the spine. Faint linear lucency through the posterior right tenth rib concerning for a fracture. Correlation with point tenderness recommended. Evaluation of the osseous structures is very limited due to osteopenia and scoliosis. IMPRESSION: 1. No acute cardiopulmonary process. 2. Findings concerning for a posterior right tenth rib fracture. Electronically Signed   By: Anner Crete M.D.   On: 08/22/2021 20:13   DG Thoracic Spine 2 View  Result Date: 08/22/2021  CLINICAL DATA:  Fall. EXAM: THORACIC SPINE 2 VIEWS; LUMBAR SPINE - COMPLETE 4+  VIEW COMPARISON:  None. FINDINGS: Thoracic spine: There is marked dextroconvex curvature of the mid/lower thoracic spine. There is no definite fracture or dislocation, although scoliosis limits evaluation. There surgical clips in the lower left neck. No other significant bone abnormalities are identified. Lumbar spine: There is marked levoconvex curvature of the lumbar spine with multilevel degenerative change. No definite acute fracture or dislocation, although scoliosis limits evaluation. There are moderate degenerative changes throughout the lumbar spine. Soft tissues are unremarkable. IMPRESSION: 1. Marked thoracolumbar scoliosis. 2. No acute bony abnormality. 3. Multilevel degenerative changes. Electronically Signed   By: Ronney Asters M.D.   On: 08/22/2021 20:19   DG Lumbar Spine Complete  Result Date: 08/22/2021 CLINICAL DATA:  Fall. EXAM: THORACIC SPINE 2 VIEWS; LUMBAR SPINE - COMPLETE 4+ VIEW COMPARISON:  None. FINDINGS: Thoracic spine: There is marked dextroconvex curvature of the mid/lower thoracic spine. There is no definite fracture or dislocation, although scoliosis limits evaluation. There surgical clips in the lower left neck. No other significant bone abnormalities are identified. Lumbar spine: There is marked levoconvex curvature of the lumbar spine with multilevel degenerative change. No definite acute fracture or dislocation, although scoliosis limits evaluation. There are moderate degenerative changes throughout the lumbar spine. Soft tissues are unremarkable. IMPRESSION: 1. Marked thoracolumbar scoliosis. 2. No acute bony abnormality. 3. Multilevel degenerative changes. Electronically Signed   By: Ronney Asters M.D.   On: 08/22/2021 20:19   DG Pelvis 1-2 Views  Result Date: 08/22/2021 CLINICAL DATA:  Initial evaluation for acute trauma, fall. EXAM: PELVIS - 1-2 VIEW COMPARISON:  None. FINDINGS: No acute fracture dislocation. Femoral heads in normal alignment within the acetabula.  Femoral head height maintained. SI joints approximated. No pubic diastasis. Underlying osteopenia noted. Severe lumbar levoscoliosis with multilevel spondylosis noted. Visualized soft tissues demonstrate no acute finding. IMPRESSION: No acute osseous abnormality about the pelvis. Electronically Signed   By: Jeannine Boga M.D.   On: 08/22/2021 20:13   CT HEAD WO CONTRAST (5MM)  Result Date: 08/22/2021 CLINICAL DATA:  Head trauma, moderate to severe. Increasing falls and difficulty walking. EXAM: CT HEAD WITHOUT CONTRAST CT CERVICAL SPINE WITHOUT CONTRAST TECHNIQUE: Multidetector CT imaging of the head and cervical spine was performed following the standard protocol without intravenous contrast. Multiplanar CT image reconstructions of the cervical spine were also generated. COMPARISON:  CT head 03/04/2021 and 03/13/2021, CTA 03/14/2021 and MRI 03/14/2021. FINDINGS: CT HEAD FINDINGS Brain: Again demonstrated is a hyperdense mass with peripheral calcifications at the right cerebellopontine angle, measuring 2.2 x 1.6 cm on image 6/3, consistent with a partially thrombosed right PICA aneurysm. This does not appear significantly changed from previous CTs. There is no evidence of acute intracranial hemorrhage, mass lesion, brain edema or extra-axial fluid collection. Stable generalized atrophy with disproportionate dilatation of the lateral and 3rd ventricles, similar to previous study. There is confluent low-density within the periventricular white matter. There is no CT evidence of acute cortical infarction. Vascular: As above, partially thrombosed right PICA aneurysm, grossly stable. There is a vascular stent within the right vertebrobasilar system. Skull: Previous right suboccipital craniotomy. No acute skull findings. Sinuses/Orbits: Mild chronic ethmoid sinus mucosal thickening without air-fluid levels. The additional visualized paranasal sinuses, mastoid air cells and middle ears are clear. No significant  orbital findings. Other: None. CT CERVICAL SPINE FINDINGS Alignment: Mild degenerative anterolisthesis at C5-6. Skull base and vertebrae: No evidence of acute cervical spine fracture or traumatic subluxation.  Soft tissues and spinal canal: No prevertebral fluid or swelling. No visible canal hematoma. Disc levels: Multilevel spondylosis with disc space narrowing, uncinate spurring and facet hypertrophy. No large disc herniation identified. Upper chest: Scarring at both lung apices. Other: Postsurgical changes at the right skull base and partially thrombosed right PICA aneurysm as noted above. IMPRESSION: 1. No definite acute intracranial findings. 2. Similar appearance of partially thrombosed right PICA aneurysm with adjacent vascular stent and postsurgical changes. 3. Similar ventriculomegaly which may relate to normal pressure hydrocephalus or central atrophy. 4. No evidence of acute cervical spine fracture, traumatic subluxation or static signs of instability. Multilevel cervical spondylosis. Electronically Signed   By: Richardean Sale M.D.   On: 08/22/2021 19:18   CT Cervical Spine Wo Contrast  Result Date: 08/22/2021 CLINICAL DATA:  Head trauma, moderate to severe. Increasing falls and difficulty walking. EXAM: CT HEAD WITHOUT CONTRAST CT CERVICAL SPINE WITHOUT CONTRAST TECHNIQUE: Multidetector CT imaging of the head and cervical spine was performed following the standard protocol without intravenous contrast. Multiplanar CT image reconstructions of the cervical spine were also generated. COMPARISON:  CT head 03/04/2021 and 03/13/2021, CTA 03/14/2021 and MRI 03/14/2021. FINDINGS: CT HEAD FINDINGS Brain: Again demonstrated is a hyperdense mass with peripheral calcifications at the right cerebellopontine angle, measuring 2.2 x 1.6 cm on image 6/3, consistent with a partially thrombosed right PICA aneurysm. This does not appear significantly changed from previous CTs. There is no evidence of acute intracranial  hemorrhage, mass lesion, brain edema or extra-axial fluid collection. Stable generalized atrophy with disproportionate dilatation of the lateral and 3rd ventricles, similar to previous study. There is confluent low-density within the periventricular white matter. There is no CT evidence of acute cortical infarction. Vascular: As above, partially thrombosed right PICA aneurysm, grossly stable. There is a vascular stent within the right vertebrobasilar system. Skull: Previous right suboccipital craniotomy. No acute skull findings. Sinuses/Orbits: Mild chronic ethmoid sinus mucosal thickening without air-fluid levels. The additional visualized paranasal sinuses, mastoid air cells and middle ears are clear. No significant orbital findings. Other: None. CT CERVICAL SPINE FINDINGS Alignment: Mild degenerative anterolisthesis at C5-6. Skull base and vertebrae: No evidence of acute cervical spine fracture or traumatic subluxation. Soft tissues and spinal canal: No prevertebral fluid or swelling. No visible canal hematoma. Disc levels: Multilevel spondylosis with disc space narrowing, uncinate spurring and facet hypertrophy. No large disc herniation identified. Upper chest: Scarring at both lung apices. Other: Postsurgical changes at the right skull base and partially thrombosed right PICA aneurysm as noted above. IMPRESSION: 1. No definite acute intracranial findings. 2. Similar appearance of partially thrombosed right PICA aneurysm with adjacent vascular stent and postsurgical changes. 3. Similar ventriculomegaly which may relate to normal pressure hydrocephalus or central atrophy. 4. No evidence of acute cervical spine fracture, traumatic subluxation or static signs of instability. Multilevel cervical spondylosis. Electronically Signed   By: Richardean Sale M.D.   On: 08/22/2021 19:18    Procedures Procedures   Medications Ordered in ED Medications  ALPRAZolam (XANAX) tablet 0.5 mg (has no administration in time  range)  levETIRAcetam (KEPPRA) tablet 500 mg (500 mg Oral Given 08/23/21 0940)  enoxaparin (LOVENOX) injection 40 mg (40 mg Subcutaneous Given 08/23/21 0941)  lactated ringers infusion ( Intravenous New Bag/Given 08/23/21 0048)  acetaminophen (TYLENOL) tablet 650 mg (has no administration in time range)    Or  acetaminophen (TYLENOL) suppository 650 mg (has no administration in time range)  senna-docusate (Senokot-S) tablet 1 tablet (has no administration in time range)  ondansetron (ZOFRAN) tablet 4 mg ( Oral See Alternative 08/23/21 0043)    Or  ondansetron (ZOFRAN) injection 4 mg (4 mg Intravenous Given 08/23/21 0043)  mirtazapine (REMERON) tablet 15 mg (15 mg Oral Given 08/23/21 0043)  traMADol (ULTRAM) tablet 50 mg (has no administration in time range)  sodium chloride 0.9 % bolus 500 mL (0 mLs Intravenous Stopped 08/22/21 2104)  HYDROcodone-acetaminophen (NORCO/VICODIN) 5-325 MG per tablet 1 tablet (1 tablet Oral Given 08/22/21 1848)  potassium chloride SA (KLOR-CON) CR tablet 40 mEq (40 mEq Oral Given 08/22/21 1908)  potassium chloride SA (KLOR-CON) CR tablet 40 mEq (40 mEq Oral Given 08/23/21 0043)  lip balm (CARMEX) ointment (75 application Topical Given 08/23/21 0139)  potassium chloride (KLOR-CON) packet 40 mEq (40 mEq Oral Given 08/23/21 0941)    ED Course  I have reviewed the triage vital signs and the nursing notes.  Pertinent labs & imaging results that were available during my care of the patient were reviewed by me and considered in my medical decision making (see chart for details).  Clinical Course as of 08/23/21 1016  Fri Aug 22, 2021  2036 Discussed with [facility] hospitalist Dr. Posey Pronto who evaluate the patient for admission. [MB]    Clinical Course User Index [MB] Hayden Rasmussen, MD   MDM Rules/Calculators/A&P                          This patient complains of generalized weakness, diffuse back pain; this involves an extensive number of treatment Options and  is a complaint that carries with it a high risk of complications and Morbidity. The differential includes failure to thrive, dehydration, renal failure, metabolic derangement, spine fracture  I ordered, reviewed and interpreted labs, which included CBC with normal white count and hemoglobin, chemistries with markedly low potassium, urinalysis possible infection, CK normal, COVID and flu negative I ordered medication oral pain medicine IV fluids oral potassium I ordered imaging studies which included CT head and neck, x-rays of spine chest pelvis and I independently    visualized and interpreted imaging which showed degenerative changes no acute fractures or bleed Additional history obtained from patient's daughter Previous records obtained and reviewed in epic no recent admissions I consulted [facility] hospitalist Dr. Posey Pronto and discussed lab and imaging findings  Critical Interventions: None  After the interventions stated above, I reevaluated the patient and found patient still to be weak although nontoxic-appearing.  She is agreeable to plan for admission for further management of her symptoms.   Final Clinical Impression(s) / ED Diagnoses Final diagnoses:  Hypokalemia  Failure to thrive in adult  Chronic back pain, unspecified back location, unspecified back pain laterality    Rx / DC Orders ED Discharge Orders     None        Hayden Rasmussen, MD 08/23/21 1019

## 2021-08-22 NOTE — ED Notes (Signed)
Pt was brought in room from triage. Daughter at bedside. Per daughter "she needs to go to an inpatient facility, she lives by herself and only a family friend checks on her, I work full time".

## 2021-08-22 NOTE — ED Provider Notes (Signed)
Emergency Medicine Provider Triage Evaluation Note  Shelie NAARAH BORGERDING , a 75 y.o. female  was evaluated in triage.  Pt complains of failure to thrive.  Daughter helps provide history.  Patient lives at home by herself.  Neighbors have been coming to help take care of her.  Daughter states patient has had multiple falls.  She is not able to walk, previously was able to walk with a walker.  Most recent fall on Wednesday.  On floor for duration of time.  She is getting more forgetful.  No recent illnesses.  Review of Systems  Positive: Fall, multiple areas of pain Negative:   Physical Exam  BP 127/83 (BP Location: Left Arm)   Pulse 100   Temp 98.3 F (36.8 C) (Oral)   Resp 16   SpO2 97%  Gen:   Awake, no distress   Resp:  Normal effort  MSK:   Tenderness to midline back, bilateral pelvis, chest wall.  Significant scoliosis, kyphosis Other:    Medical Decision Making  Medically screening exam initiated at 3:38 PM.  Appropriate orders placed.  Karliah DNIYAH GRANT was informed that the remainder of the evaluation will be completed by another provider, this initial triage assessment does not replace that evaluation, and the importance of remaining in the ED until their evaluation is complete.  Failure to thrive, able to ambulate, multiple falls   Kasmira Cacioppo A, PA-C 08/22/21 1542    Wyvonnia Dusky, MD 08/22/21 1616

## 2021-08-22 NOTE — ED Triage Notes (Addendum)
History given by patient's daughter who reports patient lives alone and has been having trouble taking care of herself. She reports increased falls and difficulty walking. Pt was found today covered in urine and feces for unknown amount of time. Reports significant weight loss over past year. Hx CVA, HTN.

## 2021-08-23 DIAGNOSIS — Z20822 Contact with and (suspected) exposure to covid-19: Secondary | ICD-10-CM | POA: Diagnosis present

## 2021-08-23 DIAGNOSIS — R2689 Other abnormalities of gait and mobility: Secondary | ICD-10-CM | POA: Diagnosis not present

## 2021-08-23 DIAGNOSIS — G459 Transient cerebral ischemic attack, unspecified: Secondary | ICD-10-CM | POA: Diagnosis not present

## 2021-08-23 DIAGNOSIS — M6281 Muscle weakness (generalized): Secondary | ICD-10-CM | POA: Diagnosis not present

## 2021-08-23 DIAGNOSIS — M6259 Muscle wasting and atrophy, not elsewhere classified, multiple sites: Secondary | ICD-10-CM | POA: Diagnosis not present

## 2021-08-23 DIAGNOSIS — R5381 Other malaise: Secondary | ICD-10-CM | POA: Diagnosis present

## 2021-08-23 DIAGNOSIS — I10 Essential (primary) hypertension: Secondary | ICD-10-CM | POA: Diagnosis present

## 2021-08-23 DIAGNOSIS — R627 Adult failure to thrive: Secondary | ICD-10-CM | POA: Diagnosis not present

## 2021-08-23 DIAGNOSIS — Z7401 Bed confinement status: Secondary | ICD-10-CM | POA: Diagnosis not present

## 2021-08-23 DIAGNOSIS — R296 Repeated falls: Secondary | ICD-10-CM | POA: Diagnosis present

## 2021-08-23 DIAGNOSIS — M419 Scoliosis, unspecified: Secondary | ICD-10-CM | POA: Diagnosis present

## 2021-08-23 DIAGNOSIS — G8929 Other chronic pain: Secondary | ICD-10-CM | POA: Diagnosis present

## 2021-08-23 DIAGNOSIS — Z602 Problems related to living alone: Secondary | ICD-10-CM | POA: Diagnosis present

## 2021-08-23 DIAGNOSIS — Z87442 Personal history of urinary calculi: Secondary | ICD-10-CM | POA: Diagnosis not present

## 2021-08-23 DIAGNOSIS — F329 Major depressive disorder, single episode, unspecified: Secondary | ICD-10-CM | POA: Diagnosis present

## 2021-08-23 DIAGNOSIS — K219 Gastro-esophageal reflux disease without esophagitis: Secondary | ICD-10-CM | POA: Diagnosis not present

## 2021-08-23 DIAGNOSIS — F32A Depression, unspecified: Secondary | ICD-10-CM

## 2021-08-23 DIAGNOSIS — R2681 Unsteadiness on feet: Secondary | ICD-10-CM | POA: Diagnosis not present

## 2021-08-23 DIAGNOSIS — Z681 Body mass index (BMI) 19 or less, adult: Secondary | ICD-10-CM | POA: Diagnosis not present

## 2021-08-23 DIAGNOSIS — R1312 Dysphagia, oropharyngeal phase: Secondary | ICD-10-CM | POA: Diagnosis not present

## 2021-08-23 DIAGNOSIS — R41841 Cognitive communication deficit: Secondary | ICD-10-CM | POA: Diagnosis not present

## 2021-08-23 DIAGNOSIS — Z8673 Personal history of transient ischemic attack (TIA), and cerebral infarction without residual deficits: Secondary | ICD-10-CM | POA: Diagnosis not present

## 2021-08-23 DIAGNOSIS — S2239XA Fracture of one rib, unspecified side, initial encounter for closed fracture: Secondary | ICD-10-CM

## 2021-08-23 DIAGNOSIS — W19XXXA Unspecified fall, initial encounter: Secondary | ICD-10-CM | POA: Diagnosis present

## 2021-08-23 DIAGNOSIS — G40909 Epilepsy, unspecified, not intractable, without status epilepticus: Secondary | ICD-10-CM | POA: Diagnosis present

## 2021-08-23 DIAGNOSIS — E43 Unspecified severe protein-calorie malnutrition: Secondary | ICD-10-CM | POA: Diagnosis not present

## 2021-08-23 DIAGNOSIS — E876 Hypokalemia: Secondary | ICD-10-CM | POA: Diagnosis present

## 2021-08-23 DIAGNOSIS — S2239XD Fracture of one rib, unspecified side, subsequent encounter for fracture with routine healing: Secondary | ICD-10-CM | POA: Diagnosis not present

## 2021-08-23 DIAGNOSIS — R911 Solitary pulmonary nodule: Secondary | ICD-10-CM | POA: Diagnosis present

## 2021-08-23 DIAGNOSIS — S2231XA Fracture of one rib, right side, initial encounter for closed fracture: Secondary | ICD-10-CM | POA: Diagnosis present

## 2021-08-23 LAB — CBC
HCT: 36.8 % (ref 36.0–46.0)
Hemoglobin: 12.1 g/dL (ref 12.0–15.0)
MCH: 28.4 pg (ref 26.0–34.0)
MCHC: 32.9 g/dL (ref 30.0–36.0)
MCV: 86.4 fL (ref 80.0–100.0)
Platelets: 250 10*3/uL (ref 150–400)
RBC: 4.26 MIL/uL (ref 3.87–5.11)
RDW: 13.2 % (ref 11.5–15.5)
WBC: 5.3 10*3/uL (ref 4.0–10.5)
nRBC: 0 % (ref 0.0–0.2)

## 2021-08-23 LAB — COMPREHENSIVE METABOLIC PANEL
ALT: 10 U/L (ref 0–44)
AST: 16 U/L (ref 15–41)
Albumin: 2.9 g/dL — ABNORMAL LOW (ref 3.5–5.0)
Alkaline Phosphatase: 62 U/L (ref 38–126)
Anion gap: 5 (ref 5–15)
BUN: 7 mg/dL — ABNORMAL LOW (ref 8–23)
CO2: 31 mmol/L (ref 22–32)
Calcium: 8.6 mg/dL — ABNORMAL LOW (ref 8.9–10.3)
Chloride: 103 mmol/L (ref 98–111)
Creatinine, Ser: 0.5 mg/dL (ref 0.44–1.00)
GFR, Estimated: >60 mL/min (ref >60)
Glucose, Bld: 85 mg/dL (ref 70–99)
Potassium: 3.1 mmol/L — ABNORMAL LOW (ref 3.5–5.1)
Sodium: 139 mmol/L (ref 135–145)
Total Bilirubin: 0.7 mg/dL (ref 0.3–1.2)
Total Protein: 5.6 g/dL — ABNORMAL LOW (ref 6.5–8.1)

## 2021-08-23 LAB — CORTISOL-AM, BLOOD: Cortisol - AM: 6 ug/dL — ABNORMAL LOW (ref 6.7–22.6)

## 2021-08-23 LAB — FOLATE: Folate: 8.3 ng/mL (ref >5.9)

## 2021-08-23 LAB — VITAMIN B12: Vitamin B-12: 238 pg/mL (ref 180–914)

## 2021-08-23 LAB — TSH: TSH: 4.422 u[IU]/mL (ref 0.350–4.500)

## 2021-08-23 LAB — T4, FREE: Free T4: 1.03 ng/dL (ref 0.61–1.12)

## 2021-08-23 MED ORDER — POTASSIUM CHLORIDE 20 MEQ PO PACK
40.0000 meq | PACK | Freq: Once | ORAL | Status: AC
  Administered 2021-08-23: 40 meq via ORAL
  Filled 2021-08-23: qty 2

## 2021-08-23 MED ORDER — MIRTAZAPINE 15 MG PO TABS
7.5000 mg | ORAL_TABLET | Freq: Every day | ORAL | Status: DC
  Administered 2021-08-23 – 2021-08-27 (×5): 7.5 mg via ORAL
  Filled 2021-08-23 (×5): qty 1

## 2021-08-23 MED ORDER — LIP MEDEX EX OINT
TOPICAL_OINTMENT | Freq: Once | CUTANEOUS | Status: AC
  Administered 2021-08-23: 75 via TOPICAL
  Filled 2021-08-23: qty 7

## 2021-08-23 MED ORDER — POTASSIUM CHLORIDE CRYS ER 20 MEQ PO TBCR
40.0000 meq | EXTENDED_RELEASE_TABLET | Freq: Once | ORAL | Status: AC
  Administered 2021-08-23: 40 meq via ORAL
  Filled 2021-08-23: qty 2

## 2021-08-23 NOTE — Consult Note (Signed)
[facility] Consult   Reason for Consult: Depression and failure to thrive Referring Physician: Dr. Posey Pronto Patient Identification: KINZA GOUVEIA MRN:  [id number] Principal Diagnosis: Failure to thrive in adult Diagnosis:  Principal Problem:   Failure to thrive in adult Active Problems:   Scoliosis   Depression   Cerebral aneurysm   Hypokalemia   Lung nodule   Seizure ([place])   CVA (cerebral vascular accident) [facility])   Right 10th rib fracture   Physical deconditioning   Total Time spent with patient: 20 minutes  Subjective:   Elaine Rivera is a 75 y.o. female patient admitted with failure to thrive.  Per chart review patient has been unable to take care of herself, was brought and covered in feces and urine.  Her primary team feels as though her depression is playing a large role in her failure to thrive.  Psych consult was placed for this reason by Dr. Posey Pronto.  Patient is seen and attempted to evaluate while in the emergency department, however patient has minimal participation and remains guarded throughout.  She was able to answer most orientation questions, and agree to psych evaluation however she does not answer most questions.  She is able to verbalize that she is depressed,, however does not wish to participate at this time.  Patient is also able to verbalize that she has multiple medical conditions going on, that contribute to her weight loss not just depression.  She feels as if her pain were under control, she may be able to progress well and overcome her weakness.  Patient denies any previous psychiatric diagnosis to include depression, anxiety, PTSD, dementia, bipolar disorder, and or eating disorder.  Patient is able to recall recent stroke that resulted in her being hospitalized and going to rehabilitation for quite some time.  She does provide consent to continue with current antidepressant therapy.  Writer tried multiple attempts to get patient to continue to  engage and answer questions, however wishes to decline further participation and or questions.  Patient would open eyes, and not respond yet verbalized that she was able to hear questions from this nurse practitioner.  She further denies any suicidal ideation, homicidal ideations, and or auditory or visual hallucinations.  HPI:  Elaine Rivera is a 75 y.o. female with medical history significant of depression, anemia, scoliosis, GERD, HTN.  Present presented with complaints of fatigue and tiredness.  Patient was actually brought in as she was not able to take care of herself.  Her pain was progressively worsening that she was not able to ambulate from her couch.  And therefore she was spending most of the time in the same position.  She was brought in covered in feces and urine. At the time of my evaluation patient denies any suicidal ideation but tells me that she remains with with low mood.  She also has difficulty going to sleep and staying asleep.  She does not have any appetite.  No abdominal pain.  No nausea no vomiting at the time of my evaluation.  No chest pain or shortness of breath.  No vision changes.  Denies any alcohol abuse.  Past Psychiatric History: Denies  Risk to Self: Denies Risk to Others: Denies Prior Inpatient Therapy: Unable to assess Prior Outpatient Therapy: Unable to assess  Past Medical History:  Past Medical History:  Diagnosis Date   Anemia    years ago   Arthritis    Basal cell carcinoma of eye    biopsy of  left eye/ non cancerous   Complication of anesthesia    nausea/vomiting   Depression    GERD (gastroesophageal reflux disease)    History of kidney stones    Hypertension    no longer on medications    Past Surgical History:  Procedure Laterality Date   CEREBRAL ANEURYSM REPAIR     1998   COLONOSCOPY     ECTOPIC PREGNANCY SURGERY     x2    ENTEROSCOPY N/A 03/01/2020   Procedure: ENTEROSCOPY;  Surgeon: Carol Ada, MD;  Location: [facility];   Service: Endoscopy;  Laterality: N/A;   ESOPHAGOGASTRODUODENOSCOPY N/A 04/22/2016   Procedure: ESOPHAGOGASTRODUODENOSCOPY (EGD);  Surgeon: Juanita Craver, MD;  Location: [facility];  Service: Endoscopy;  Laterality: N/A;   IR 3D INDEPENDENT WKST  09/05/2018   IR ANGIO INTRA EXTRACRAN SEL COM CAROTID INNOMINATE BILAT MOD SED  09/05/2018   IR ANGIO VERTEBRAL SEL VERTEBRAL UNI R MOD SED  09/05/2018   IR ANGIOGRAM FOLLOW UP STUDY  09/05/2018   IR RADIOLOGIST EVAL & MGMT  03/28/2021   IR TRANSCATH/EMBOLIZ  09/05/2018   PARATHYROIDECTOMY     2002   RADIOLOGY WITH ANESTHESIA N/A 09/05/2018   Procedure: EMBOLIZATION;  Surgeon: Radiologist, Medication, MD;  Location: [facility];  Service: Radiology;  Laterality: N/A;   TONSILLECTOMY     1953   VAGINAL HYSTERECTOMY     2001   VOCAL CORD LATERALIZATION, ENDOSCOPIC APPROACH W/ MLB     2007 (@[facility] )   WRIST SURGERY     left side/2008   WRIST SURGERY     right side/ 2002   Family History:  Family History  Family history unknown: Yes   Family Psychiatric  History: Unable to assess Social History:  Social History   Substance and Sexual Activity  Alcohol Use No     Social History   Substance and Sexual Activity  Drug Use Never    Social History   Socioeconomic History   Marital status: Divorced    Spouse name: Not on file   Number of children: Not on file   Years of education: Not on file   Highest education level: Not on file  Occupational History   Not on file  Tobacco Use   Smoking status: Never   Smokeless tobacco: Never  Vaping Use   Vaping Use: Never used  Substance and Sexual Activity   Alcohol use: No   Drug use: Never   Sexual activity: Not on file  Other Topics Concern   Not on file  Social History Narrative   Not on file   Social Determinants of Health   Financial Resource Strain: Not on file  Food Insecurity: Not on file  Transportation Needs: Not on file  Physical Activity: Not on file  Stress: Not on file  Social  Connections: Not on file   Additional Social History:    Allergies:   Allergies  Allergen Reactions   Sulfa Antibiotics Other (See Comments)    Reaction:  Unknown     Labs:  Results for orders placed or performed during the hospital encounter of 08/22/21 (from the past 48 hour(s))  CBC with Differential     Status: Abnormal   Collection Time: 08/22/21  4:43 PM  Result Value Ref Range   WBC 10.8 (H) 4.0 - 10.5 K/uL   RBC 5.50 (H) 3.87 - 5.11 MIL/uL   Hemoglobin 15.4 (H) 12.0 - 15.0 g/dL   HCT 46.7 (H) 36.0 - 46.0 %   MCV 84.9  80.0 - 100.0 fL   MCH 28.0 26.0 - 34.0 pg   MCHC 33.0 30.0 - 36.0 g/dL   RDW 13.1 11.5 - 15.5 %   Platelets 325 150 - 400 K/uL   nRBC 0.0 0.0 - 0.2 %   Neutrophils Relative % 80 %   Neutro Abs 8.7 (H) 1.7 - 7.7 K/uL   Lymphocytes Relative 13 %   Lymphs Abs 1.4 0.7 - 4.0 K/uL   Monocytes Relative 5 %   Monocytes Absolute 0.6 0.1 - 1.0 K/uL   Eosinophils Relative 1 %   Eosinophils Absolute 0.1 0.0 - 0.5 K/uL   Basophils Relative 0 %   Basophils Absolute 0.0 0.0 - 0.1 K/uL   Immature Granulocytes 1 %   Abs Immature Granulocytes 0.05 0.00 - 0.07 K/uL    Comment: Performed at [facility], [place] [address], [place], [place] [address]  Comprehensive metabolic panel     Status: Abnormal   Collection Time: 08/22/21  4:43 PM  Result Value Ref Range   Sodium 140 135 - 145 mmol/L   Potassium 2.4 (LL) 3.5 - 5.1 mmol/L    Comment: CRITICAL RESULT CALLED TO, READ BACK BY AND VERIFIED WITH: WEST,S.RN AT 1711 08/22/21 MULLINS,T    Chloride 99 98 - 111 mmol/L   CO2 32 22 - 32 mmol/L   Glucose, Bld 123 (H) 70 - 99 mg/dL    Comment: Glucose reference range applies only to samples taken after fasting for at least 8 hours.   BUN 10 8 - 23 mg/dL   Creatinine, Ser 0.61 0.44 - 1.00 mg/dL   Calcium 9.3 8.9 - 10.3 mg/dL   Total Protein 6.9 6.5 - 8.1 g/dL   Albumin 3.5 3.5 - 5.0 g/dL   AST 21 15 - 41 U/L   ALT 13 0 - 44 U/L   Alkaline Phosphatase  79 38 - 126 U/L   Total Bilirubin 0.6 0.3 - 1.2 mg/dL   GFR, Estimated >60 >60 mL/min    Comment: (NOTE) Calculated using the CKD-EPI Creatinine Equation (2021)    Anion gap 9 5 - 15    Comment: Performed at [facility], [place] [address], [place], [place] [address]  CK     Status: None   Collection Time: 08/22/21  4:43 PM  Result Value Ref Range   Total CK 82 38 - 234 U/L    Comment: Performed at [facility], [place] [address], [place], [place] [address]  Resp Panel by RT-PCR (Flu A&B, Covid) Nasopharyngeal Swab     Status: None   Collection Time: 08/22/21  6:42 PM   Specimen: Nasopharyngeal Swab; Nasopharyngeal(NP) swabs in vial transport medium  Result Value Ref Range   SARS Coronavirus 2 by RT PCR NEGATIVE NEGATIVE    Comment: (NOTE) SARS-CoV-2 target nucleic acids are NOT DETECTED.  The SARS-CoV-2 RNA is generally detectable in upper respiratory specimens during the acute phase of infection. The lowest concentration of SARS-CoV-2 viral copies this assay can detect is 138 copies/mL. A negative result does not preclude SARS-Cov-2 infection and should not be used as the sole basis for treatment or other patient management decisions. A negative result may occur with  improper specimen collection/handling, submission of specimen other than nasopharyngeal swab, presence of viral mutation(s) within the areas targeted by this assay, and inadequate number of viral copies(<138 copies/mL). A negative result must be combined with clinical observations, patient history, and epidemiological information. The expected result is Negative.  Fact Sheet for Patients:  EntrepreneurPulse.com.au  Fact Sheet for Healthcare Providers:  IncredibleEmployment.be  This test is no t yet approved or cleared by the [place] FDA and  has been authorized for detection and/or diagnosis of SARS-CoV-2 by FDA under an Emergency Use  Authorization (EUA). This EUA will remain  in effect (meaning this test can be used) for the duration of the COVID-19 declaration under Section 564(b)(1) of the Act, 21 U.S.C.section 360bbb-3(b)(1), unless the authorization is terminated  or revoked sooner.       Influenza A by PCR NEGATIVE NEGATIVE   Influenza B by PCR NEGATIVE NEGATIVE    Comment: (NOTE) The Xpert Xpress SARS-CoV-2/FLU/RSV plus assay is intended as an aid in the diagnosis of influenza from Nasopharyngeal swab specimens and should not be used as a sole basis for treatment. Nasal washings and aspirates are unacceptable for Xpert Xpress SARS-CoV-2/FLU/RSV testing.  Fact Sheet for Patients: EntrepreneurPulse.com.au  Fact Sheet for Healthcare Providers: IncredibleEmployment.be  This test is not yet approved or cleared by the [place] FDA and has been authorized for detection and/or diagnosis of SARS-CoV-2 by FDA under an Emergency Use Authorization (EUA). This EUA will remain in effect (meaning this test can be used) for the duration of the COVID-19 declaration under Section 564(b)(1) of the Act, 21 U.S.C. section 360bbb-3(b)(1), unless the authorization is terminated or revoked.  Performed at [facility], [place] [address], [place], [place] [address]   Magnesium     Status: None   Collection Time: 08/22/21  6:42 PM  Result Value Ref Range   Magnesium 1.9 1.7 - 2.4 mg/dL    Comment: Performed at [facility], [place] [address], [place], [place] [address]  Urinalysis, Routine w reflex microscopic Urine, Clean Catch     Status: Abnormal   Collection Time: 08/22/21  9:41 PM  Result Value Ref Range   Color, Urine YELLOW YELLOW   APPearance HAZY (A) CLEAR   Specific Gravity, Urine 1.012 1.005 - 1.030   pH 6.0 5.0 - 8.0   Glucose, UA NEGATIVE NEGATIVE mg/dL   Hgb urine dipstick SMALL (A) NEGATIVE   Bilirubin Urine NEGATIVE NEGATIVE    Ketones, ur NEGATIVE NEGATIVE mg/dL   Protein, ur NEGATIVE NEGATIVE mg/dL   Nitrite NEGATIVE NEGATIVE   Leukocytes,Ua TRACE (A) NEGATIVE   RBC / HPF 0-5 0 - 5 RBC/hpf   WBC, UA 11-20 0 - 5 WBC/hpf   Bacteria, UA RARE (A) NONE SEEN   Squamous Epithelial / LPF 0-5 0 - 5   Mucus PRESENT     Comment: Performed at [facility], [place] [address] [place], [place] [address]  Comprehensive metabolic panel     Status: Abnormal   Collection Time: 08/23/21  5:40 AM  Result Value Ref Range   Sodium 139 135 - 145 mmol/L   Potassium 3.1 (L) 3.5 - 5.1 mmol/L    Comment: DELTA CHECK NOTED   Chloride 103 98 - 111 mmol/L   CO2 31 22 - 32 mmol/L   Glucose, Bld 85 70 - 99 mg/dL    Comment: Glucose reference range applies only to samples taken after fasting for at least 8 hours.   BUN 7 (L) 8 - 23 mg/dL   Creatinine, Ser 0.50 0.44 - 1.00 mg/dL   Calcium 8.6 (L) 8.9 - 10.3 mg/dL   Total Protein 5.6 (L) 6.5 - 8.1 g/dL   Albumin 2.9 (L) 3.5 - 5.0 g/dL   AST 16 15 -  41 U/L   ALT 10 0 - 44 U/L   Alkaline Phosphatase 62 38 - 126 U/L   Total Bilirubin 0.7 0.3 - 1.2 mg/dL   GFR, Estimated >60 >60 mL/min    Comment: (NOTE) Calculated using the CKD-EPI Creatinine Equation (2021)    Anion gap 5 5 - 15    Comment: Performed at [facility], [place] [address] [place], [place] [address]  CBC     Status: None   Collection Time: 08/23/21  5:40 AM  Result Value Ref Range   WBC 5.3 4.0 - 10.5 K/uL   RBC 4.26 3.87 - 5.11 MIL/uL   Hemoglobin 12.1 12.0 - 15.0 g/dL   HCT 36.8 36.0 - 46.0 %   MCV 86.4 80.0 - 100.0 fL   MCH 28.4 26.0 - 34.0 pg   MCHC 32.9 30.0 - 36.0 g/dL   RDW 13.2 11.5 - 15.5 %   Platelets 250 150 - 400 K/uL   nRBC 0.0 0.0 - 0.2 %    Comment: Performed at [facility], [place] [address], [place], [place] [address]  TSH     Status: None   Collection Time: 08/23/21  5:40 AM  Result Value Ref Range   TSH 4.422 0.350 - 4.500 uIU/mL    Comment:  Performed by a 3rd Generation assay with a functional sensitivity of <=0.01 uIU/mL. Performed at [facility], [place] [address] [place], [place] [address]   Cortisol-am, blood     Status: Abnormal   Collection Time: 08/23/21  5:40 AM  Result Value Ref Range   Cortisol - AM 6.0 (L) 6.7 - 22.6 ug/dL    Comment: Performed at [place] [address], [place], [place] [address]  Vitamin B12     Status: None   Collection Time: 08/23/21  5:40 AM  Result Value Ref Range   Vitamin B-12 238 180 - 914 pg/mL    Comment: (NOTE) This assay is not validated for testing neonatal or myeloproliferative syndrome specimens for Vitamin B12 levels. Performed at [facility], [place] [address] [place], [place] [address]   Folate, serum, performed at [facility] lab     Status: None   Collection Time: 08/23/21  5:40 AM  Result Value Ref Range   Folate 8.3 >5.9 ng/mL    Comment: Performed at [facility], [place] [address], [place], [place] [address]  T4, free     Status: None   Collection Time: 08/23/21  5:40 AM  Result Value Ref Range   Free T4 1.03 0.61 - 1.12 ng/dL    Comment: (NOTE) Biotin ingestion may interfere with free T4 tests. If the results are inconsistent with the TSH level, previous test results, or the clinical presentation, then consider biotin interference. If needed, order repeat testing after stopping biotin. Performed at [facility], [place] [address] [place],  [address]     Current Facility-Administered Medications  Medication Dose Route Frequency Provider Last Rate Last Admin   acetaminophen (TYLENOL) tablet 650 mg  650 mg Oral Q6H PRN Lavina Hamman, MD       Or   acetaminophen (TYLENOL) suppository 650 mg  650 mg Rectal Q6H PRN Lavina Hamman, MD       ALPRAZolam Duanne Moron) tablet 0.5 mg  0.5 mg Oral BID PRN Lavina Hamman, MD       enoxaparin (LOVENOX) injection 40 mg  40 mg Subcutaneous Q24H Berle Mull  M, MD    40 mg at 08/23/21 0941   lactated ringers infusion   Intravenous Continuous Lavina Hamman, MD 100 mL/hr at 08/23/21 0048 New Bag at 08/23/21 0048   levETIRAcetam (KEPPRA) tablet 500 mg  500 mg Oral BID Lavina Hamman, MD   500 mg at 08/23/21 0940   mirtazapine (REMERON) tablet 15 mg  15 mg Oral QHS Lavina Hamman, MD   15 mg at 08/23/21 0043   ondansetron (ZOFRAN) tablet 4 mg  4 mg Oral Q6H PRN Lavina Hamman, MD       Or   ondansetron [facility]) injection 4 mg  4 mg Intravenous Q6H PRN Lavina Hamman, MD   4 mg at 08/23/21 0043   senna-docusate (Senokot-S) tablet 1 tablet  1 tablet Oral QHS PRN Lavina Hamman, MD       traMADol Veatrice Bourbon) tablet 50 mg  50 mg Oral Q6H PRN Lavina Hamman, MD       Current Outpatient Medications  Medication Sig Dispense Refill   ALPRAZolam (XANAX) 0.5 MG tablet Take 1 tablet (0.5 mg total) by mouth 2 (two) times daily as needed for anxiety. (Patient not taking: Reported on 08/22/2021) 6 tablet 0   atorvastatin (LIPITOR) 40 MG tablet Take 1 tablet (40 mg total) by mouth daily before supper. (Patient not taking: Reported on 08/22/2021)     levETIRAcetam (KEPPRA) 500 MG tablet Take 1 tablet (500 mg total) by mouth 2 (two) times daily. (Patient not taking: Reported on 08/22/2021)     temazepam (RESTORIL) 15 MG capsule Take 1-2 capsules (15-30 mg total) by mouth at bedtime as needed for sleep. (Patient not taking: Reported on 08/22/2021) 6 capsule 0    Musculoskeletal: Strength & Muscle Tone:  Unable to assess Gait & Station: unable to stand Patient leans:  Unable to assess     Psychiatric Specialty Exam:  Presentation  General Appearance: Disheveled  Eye Contact:None  Speech:Slow  Speech Volume:Decreased  Handedness:Right   Mood and Affect  Mood:Dysphoric; Depressed  Affect:Depressed; Flat   Thought Process  Thought Processes:Linear  Descriptions of Associations:Intact  Orientation:Partial  Thought Content:Logical  History of  Schizophrenia/Schizoaffective disorder:No data recorded Duration of Psychotic Symptoms:No data recorded Hallucinations:Hallucinations: None  Ideas of Reference:None  Suicidal Thoughts:Suicidal Thoughts: No  Homicidal Thoughts:Homicidal Thoughts: No   Sensorium  Memory:No data recorded Judgment:No data recorded Insight:No data recorded  Executive Functions  Concentration:No data recorded Attention Span:No data recorded Recall:No data recorded Fund of Knowledge:No data recorded Language:No data recorded  Psychomotor Activity  Psychomotor Activity:No data recorded  Assets  Assets:No data recorded  Sleep  Sleep:No data recorded  Physical Exam: Physical Exam ROS Blood pressure 134/85, pulse 65, temperature 98.3 F (36.8 C), temperature source Oral, resp. rate 18, SpO2 95 %. There is no height or weight on file to calculate BMI.  Treatment Plan Summary: Plan primary team has initiated mirtazapine 7.5 mg p.o. nightly.  This is an appropriate recommendation for patient with depression symptoms, failure to thrive, weight loss, poor appetite.  Did discuss expectations with patient to include 4 to 6 weeks, prior to medication being fully effective.  Patient is encouraged to complete physical therapy and occupational therapy, and likely will result in skilled nursing facility due to her severe physical deconditioning and debility.  May consider higher level of care, as daughter apparently works full-time and is unable to provide appropriate services to her mother at this time.  -Continue mirtazapine 7.5 mg p.o. nightly, if patient  continues to tolerate may increase to 15 mg p.o. nightly.  However medication works better at a lower dose if we are targeting appetite stimulation and insomnia.  -It is also worth noting incidental lung nodules(2) in June 2022 with repeat recommendations for 3 to 6 months CT scan follow-up.  Considering patient was unable to follow up adequately in  outpatient setting, recommend obtaining a repeat CT scan during this hospitalization.  Will defer current recommendations to [facility] hospitalist.  -Psychiatry consult liaison service to sign off at this time..-We will recommend reconsult if warranted, however patient refuses to participate any further at this time.   Disposition: No evidence of imminent risk to self or others at present.   Patient does not meet criteria for psychiatric inpatient admission. Supportive therapy provided about ongoing stressors. Discussed crisis plan, support from social network, calling 911, coming to the Emergency Department, and calling Suicide Hotline.  Suella Broad, FNP 08/23/2021 12:47 PM

## 2021-08-23 NOTE — Assessment & Plan Note (Signed)
Playing a large role in current presentation.  Denies any suicidal ideation. Will consult psychiatry. Will start on Remeron.

## 2021-08-23 NOTE — H&P (Addendum)
History and Physical    Elaine Rivera PNT:[id number] DOB: Dec 31, 1945 DOA: 08/22/2021  PCP: System, Provider Not In   Chief Complaint: Unable to take care of herself  HPI: Elaine Rivera is a 75 y.o. female with medical history significant of depression, anemia, scoliosis, GERD, HTN.  Present presented with complaints of fatigue and tiredness.  Patient was actually brought in as she was not able to take care of herself.  Her pain was progressively worsening that she was not able to ambulate from her couch.  And therefore she was spending most of the time in the same position.  She was brought in covered in feces and urine. At the time of my evaluation patient denies any suicidal ideation but tells me that she remains with with low mood.  She also has difficulty going to sleep and staying asleep.  She does not have any appetite.  No abdominal pain.  No nausea no vomiting at the time of my evaluation.  No chest pain or shortness of breath.  No vision changes.  Denies any alcohol abuse.  Review of Systems: Review of system as mentioned in the HPI.   As per HPI otherwise 10 point review of systems negative.   Allergies  Allergen Reactions   Sulfa Antibiotics Other (See Comments)    Reaction:  Unknown     Past Medical History:  Diagnosis Date   Anemia    years ago   Arthritis    Basal cell carcinoma of eye    biopsy of left eye/ non cancerous   Complication of anesthesia    nausea/vomiting   Depression    GERD (gastroesophageal reflux disease)    History of kidney stones    Hypertension    no longer on medications    Past Surgical History:  Procedure Laterality Date   CEREBRAL ANEURYSM REPAIR     1998   COLONOSCOPY     ECTOPIC PREGNANCY SURGERY     x2    ENTEROSCOPY N/A 03/01/2020   Procedure: ENTEROSCOPY;  Surgeon: Carol Ada, MD;  Location: [facility];  Service: Endoscopy;  Laterality: N/A;   ESOPHAGOGASTRODUODENOSCOPY N/A 04/22/2016   Procedure:  ESOPHAGOGASTRODUODENOSCOPY (EGD);  Surgeon: Juanita Craver, MD;  Location: [facility];  Service: Endoscopy;  Laterality: N/A;   IR 3D INDEPENDENT WKST  09/05/2018   IR ANGIO INTRA EXTRACRAN SEL COM CAROTID INNOMINATE BILAT MOD SED  09/05/2018   IR ANGIO VERTEBRAL SEL VERTEBRAL UNI R MOD SED  09/05/2018   IR ANGIOGRAM FOLLOW UP STUDY  09/05/2018   IR RADIOLOGIST EVAL & MGMT  03/28/2021   IR TRANSCATH/EMBOLIZ  09/05/2018   PARATHYROIDECTOMY     2002   RADIOLOGY WITH ANESTHESIA N/A 09/05/2018   Procedure: EMBOLIZATION;  Surgeon: Radiologist, Medication, MD;  Location: [place];  Service: Radiology;  Laterality: N/A;   TONSILLECTOMY     1953   VAGINAL HYSTERECTOMY     2001   VOCAL CORD LATERALIZATION, ENDOSCOPIC APPROACH W/ MLB     2007 (@[facility] )   WRIST SURGERY     left side/2008   WRIST SURGERY     right side/ 2002     reports that she has never smoked. She has never used smokeless tobacco. She reports that she does not drink alcohol and does not use drugs.  Family History  Family history unknown: Yes    Prior to Admission medications   Medication Sig Start Date End Date Taking? Authorizing Provider  ALPRAZolam Duanne Moron) 0.5 MG tablet Take 1  tablet (0.5 mg total) by mouth 2 (two) times daily as needed for anxiety. Patient not taking: Reported on 08/22/2021 03/10/21   Mariel Aloe, MD  atorvastatin (LIPITOR) 40 MG tablet Take 1 tablet (40 mg total) by mouth daily before supper. Patient not taking: Reported on 08/22/2021 03/10/21   Mariel Aloe, MD  levETIRAcetam (KEPPRA) 500 MG tablet Take 1 tablet (500 mg total) by mouth 2 (two) times daily. Patient not taking: Reported on 08/22/2021 03/15/21   Shelda Pal, DO  temazepam (RESTORIL) 15 MG capsule Take 1-2 capsules (15-30 mg total) by mouth at bedtime as needed for sleep. Patient not taking: Reported on 08/22/2021 03/10/21   Mariel Aloe, MD    Physical Exam: Vitals:   08/22/21 2100 08/22/21 2130 08/22/21 2200 08/22/21 2230   BP: 128/71 132/74 130/79 126/75  Pulse: 62 66 63 67  Resp: (!) 21 20 16  (!) 25  Temp:      TempSrc:      SpO2: 97% 96% 97% 95%   General: Appear in mild distress, no Rash; Oral Mucosa Clear, moist. no Abnormal Neck Mass Or lumps, Conjunctiva normal  Cardiovascular: S1 and S2 Present, no Murmur, Respiratory: good respiratory effort, Bilateral Air entry present and CTA, no Crackles, no wheezes Abdomen: Bowel Sound present, Soft and no tenderness Extremities: no Pedal edema Neurology: alert and oriented to time, place, and person affect appropriate. no new focal deficit Gait not checked due to patient safety concerns      Labs on Admission: I have personally reviewed the patients's labs and imaging studies.  Assessment/Plan * Failure to thrive in adult Brought in by daughter.  Was covered in urine and feces at arrival.  Reportedly lives at home, unable to take care of herself and able to ambulate and therefore mostly spent time in the day in the chair.  Potentially had a fall on Wednesday. Etiology is not clear. Suspect depression playing a larger role. Initiate Remeron and consult psychiatry.  Hypokalemia Severely low.  Potassium 2.4.  Magnesium level stable.  Likely from poor p.o. intake.  Will replace.  Depression Playing a large role in current presentation.  Denies any suicidal ideation. Will consult psychiatry. Will start on Remeron.  CVA (cerebral vascular accident) [facility]) Prior history of ischemic stroke as well.  No focal deficit right now.  Does not take aspirin.  Cerebral aneurysm Prior history of coiling and embolization.  Physical deconditioning Patient reports generalized weakness likely secondary to her pain. X-rays negative for any acute fracture. Exam negative for any focal deficit as well. Will consult PT OT and check other etiologies for deconditioning.  Lung nodule Two 7 mm right lower lobe nodule seen.  CT scan was recommended in 3 to 6 months.   Patient does not appear to have followed up since.  Right 10th rib fracture Incidentally seen.  No chest pain.  Monitor  Seizure [facility]) Reported history of seizures.  Possible to be on Keppra.  Patient not taking.  Will resume.  Scoliosis Likely causing chronic back pain.  Continue gentle narcotic regimen.      Code Status: Full Code   DVT Prophylaxis:   enoxaparin (LOVENOX) injection 40 mg Start: 08/23/21 1000   Family Communication: No family at bedside. Admission status: Observation Telemetry  Certification: The appropriate patient status for this patient is OBSERVATION. Observation status is judged to be reasonable and necessary in order to provide the required intensity of service to ensure the patient's safety. The patient's presenting  symptoms, physical exam findings, and initial radiographic and laboratory data in the context of their medical condition is felt to place them at decreased risk for further clinical deterioration. Furthermore, it is anticipated that the patient will be medically stable for discharge from the hospital within 2 midnights of admission.     Berle Mull MD [facility] If 7PM-7AM, please contact night-coverage [email or web address]  08/22/2021,

## 2021-08-23 NOTE — Assessment & Plan Note (Signed)
Two 7 mm right lower lobe nodule seen.  CT scan was recommended in 3 to 6 months.  Patient does not appear to have followed up since.

## 2021-08-23 NOTE — Progress Notes (Signed)
Pt arrived from ED to room 1507.

## 2021-08-23 NOTE — Assessment & Plan Note (Signed)
Patient reports generalized weakness likely secondary to her pain. X-rays negative for any acute fracture. Exam negative for any focal deficit as well. Will consult PT OT and check other etiologies for deconditioning.

## 2021-08-23 NOTE — Assessment & Plan Note (Signed)
Prior history of ischemic stroke as well.  No focal deficit right now.  Does not take aspirin.

## 2021-08-23 NOTE — Assessment & Plan Note (Signed)
Reported history of seizures.  Possible to be on Keppra.  Patient not taking.  Will resume.

## 2021-08-23 NOTE — Assessment & Plan Note (Signed)
Prior history of coiling and embolization.

## 2021-08-23 NOTE — Assessment & Plan Note (Signed)
Likely causing chronic back pain.  Continue gentle narcotic regimen.

## 2021-08-23 NOTE — Assessment & Plan Note (Signed)
Severely low.  Potassium 2.4.  Magnesium level stable.  Likely from poor p.o. intake.  Will replace.

## 2021-08-23 NOTE — Evaluation (Signed)
Physical Therapy Evaluation Patient Details Name: Elaine Rivera MRN: [id number] DOB: 13-Oct-1945 Today's Date: 08/23/2021  History of Present Illness  75 yo admit with complains of increaed weakness and fatiigue and unable to care for herself or even get off the couch.PMH: for CVA and scoliosis  Clinical Impression  Pt very frail and atrophy however no contractures at this time. After talking with patient for some time and working with her slowly, she has a very flat affect and seems over time very low mood and fearful of moving gradually has gotten her in this situation. She states she lies on the couch for the several weeks not able to get up and a friend stops by to change her depends and try to clean her . The has caused pt to become very weak and loss functional mobility capabilities and strength. Will benefit from continued PT while here in acute care and recommend post acute rehab in SNF. Pt agrees and would like to get better. Feel pt has great potential however will also need support mentally and physically. Will continue to follow.        Recommendations for follow up therapy are one component of a multi-disciplinary discharge planning process, led by the attending physician.  Recommendations may be updated based on patient status, additional functional criteria and insurance authorization.  Follow Up Recommendations Skilled nursing-short term rehab (<3 hours/day)    Assistance Recommended at Discharge Frequent or constant Supervision/Assistance  Functional Status Assessment Patient has had a recent decline in their functional status and demonstrates the ability to make significant improvements in function in a reasonable and predictable amount of time.  Equipment Recommendations  Rolling walker (2 wheels);BSC/3in1    Recommendations for Other Services       Precautions / Restrictions Precautions Precaution Comments: denies any falls but has not be off couch      Mobility   Bed Mobility Overal bed mobility: Needs Assistance Bed Mobility: Supine to Sit;Sit to Supine     Supine to sit: Mod assist Sit to supine: Mod assist   General bed mobility comments: had to assist with upper body and LEs, and scooting but difficult to truely assess on the ED stretchers at this time    Transfers Overall transfer level: Needs assistance Equipment used: Rolling walker (2 wheels) Transfers: Sit to/from Stand Sit to Stand: Mod assist           General transfer comment: perfromed a brief sit to stand at RW , pt was fearful and didn't remian in static standing for any duration. however she performed it fairly well wtih assist suprising since she has not ambulated in a few weeks.    Ambulation/Gait                  Stairs            Wheelchair Mobility    Modified Rankin (Stroke Patients Only)       Balance                                             Pertinent Vitals/Pain Pain Assessment: Faces Faces Pain Scale: Hurts little more Pain Location: in her back and right hip , she states this is chronic from scoliois but has been worse snce she has not been moving. Pain Descriptors / Indicators: Sore;Aching Pain Intervention(s): Monitored during session;Repositioned  Home Living Family/patient expects to be discharged to:: Skilled nursing facility Living Arrangements: Alone                      Prior Function Prior Level of Function : Patient poor historian/Family not available;Needs assist       Physical Assist : Mobility (physical);ADLs (physical) Mobility (physical): Bed mobility;Transfers;Gait;Stairs ADLs (physical): Feeding;Grooming;Bathing;Dressing;Toileting;IADLs Mobility Comments: pt very quiet durign teh assessment and unsure of PLOF that she gave. Pt stated she has a friend that comes to check on her tohelp with feedning her, bathing, and all ADLS, however she has not moved off the couch for weeks oor  maybe months  ( unclear of timeline) . She mentioned she became fearful of falling and her friend was scared to help her get up too. However in recent past she was ambulatory in her house, walking and and moving around with no AD.       Hand Dominance        Extremity/Trunk Assessment        Lower Extremity Assessment Lower Extremity Assessment: RLE deficits/detail;LLE deficits/detail RLE Deficits / Details: grossly 3+/5 for LE , however no contractures or tightness in ROM LLE Deficits / Details: grossly 3+/5 for LE , however no contractures or tightness in ROM    Cervical / Trunk Assessment Cervical / Trunk Assessment: Other exceptions Cervical / Trunk Exceptions: scoliosis  Communication   Communication: No difficulties  Cognition Arousal/Alertness: Awake/alert Behavior During Therapy: WFL for tasks assessed/performed;Flat affect Overall Cognitive Status: No family/caregiver present to determine baseline cognitive functioning                                 General Comments: difficult to know baseline however sounds like thing shave been declining at home with declining mobility as well.        General Comments      Exercises Other Exercises Other Exercises: worked in supine with B anke pumps, heel slides and SLRS 10x each AAROM   Assessment/Plan    PT Assessment Patient needs continued PT services  PT Problem List Decreased strength;Decreased activity tolerance;Decreased balance;Decreased mobility       PT Treatment Interventions Gait training;DME instruction;Therapeutic activities;Therapeutic exercise;Balance training;Functional mobility training    PT Goals (Current goals can be found in the Care Plan section)  Acute Rehab PT Goals Patient Stated Goal: I do want to feel better and get stronger . I do want to walk again PT Goal Formulation: With patient Time For Goal Achievement: 09/05/21 Potential to Achieve Goals: Fair    Frequency Min  3X/week   Barriers to discharge        Co-evaluation               AM-PAC PT "6 Clicks" Mobility  Outcome Measure Help needed turning from your back to your side while in a flat bed without using bedrails?: A Lot Help needed moving from lying on your back to sitting on the side of a flat bed without using bedrails?: A Lot Help needed moving to and from a bed to a chair (including a wheelchair)?: A Lot Help needed standing up from a chair using your arms (e.g., wheelchair or bedside chair)?: A Lot Help needed to walk in hospital room?: Total Help needed climbing 3-5 steps with a railing? : Total 6 Click Score: 10    End of Session   Activity Tolerance: Patient tolerated  treatment well;Patient limited by fatigue;Patient limited by pain Patient left: in bed Nurse Communication: Mobility status PT Visit Diagnosis: Other abnormalities of gait and mobility (R26.89);Muscle weakness (generalized) (M62.81)    Time: 4818-5631 PT Time Calculation (min) (ACUTE ONLY): 19 min   Charges:   PT Evaluation $PT Eval Low Complexity: 1 Low PT Treatments $Therapeutic Activity: 8-22 mins        Cadin Luka, PT, MPT Acute Rehabilitation Services Office: [phone] Pager: [phone] 08/23/2021   Clide Dales 08/23/2021, 5:32 PM

## 2021-08-23 NOTE — Progress Notes (Signed)
PT Note  Patient Details Name: TAGEN BRETHAUER MRN: [id number] DOB: 08-Dec-1945       Pt has PT order to evaluate. Currently in ED and continuing to get medical assessments and treatments. Will check back  on pt this afternoon for assessment to allow time for treatment and/or admission to the unit.    Clide Dales 08/23/2021, 9:52 AM Gatha Mayer, PT, MPT Acute Rehabilitation Services Office: [phone] Pager: [phone] 08/23/2021

## 2021-08-23 NOTE — Progress Notes (Signed)
PROGRESS NOTE    Elaine Rivera  XOV:[id number] DOB: [birth year] DOA: 08/22/2021 PCP: System, Provider Not In   Brief Narrative:  This 75 years old female with PMH significant for depression, anemia, scoliosis, GERD, hypertension presented in the ED with complaints of generalized fatigue and tiredness.  Patient was brought in the ED as she was not able to take care of herself.  Patient has chronic pain which has been progressively worse and she is not able to ambulate from her couch.  She was spending most of the time in the same position in the recliner and she was also found covered in the feces and urine.  Patient reports feeling depressed but denies any suicidal/ homicidal ideations.  She also reported having difficulty going to sleep and staying asleep. Patient is admitted for generalized weakness, failure to thrive, major depression.  Psych is consulted.  Assessment & Plan:   Principal Problem:   Failure to thrive in adult Active Problems:   Scoliosis   Depression   Cerebral aneurysm   Hypokalemia   Lung nodule   Seizure (HCC)   CVA (cerebral vascular accident) ([place])   Right 10th rib fracture   Physical deconditioning  Failure to thrive: Patient was brought in by daughter, Her clothes were soaked in urine and feces at arrival. Patient lives at home but unable to take care of herself and now unable to ambulate, mostly remains in her recliner.   Patient also had a fall few days back.  Suspect could be secondary to depression. Started on Remeron 7.5 mg daily. Psychiatry consulted,  recommended to continue Remeron.  Hypokalemia: Replaced and improved.  Continue to monitor  Major depression: It appears like patient is having significant depressive episode. She denies any suicidal / Homicidal ideations. Continue Remeron 7.5 mg daily. Psychiatry consulted,  recommended to continue the same.  History of CVA: Patient does have history of ischemic CVA,  there is no focal  neurological deficits on exam. She does not take any aspirin on statins.  Cerebral aneurysm: Prior history of coiling and embolization.  Physical deconditioning: Patient reported generalized weakness likely due to her chronic pain. X-rays negative for any acute fracture. Exam is nonfocal. PT and OT eval.  Right 10th rib fracture: Incidental finding.  Patient denies any chest pain.  History of seizure disorders: Patient was not taking Keppra but will resume Keppra.  Lung nodule: She is found to have 7 mm right lobe lung nodules.  CT scan is recommended in 3 to 6 months.  Scoliosis Causing chronic pain.  Continue gentle narcotic regimen.  DVT prophylaxis: Lovenox. Code Status: Full code. Family Communication: No family at bed side. Disposition Plan:    Status is: Observation  The patient remains OBS appropriate and will d/c before 2 midnights.  Patient is admitted for failure to thrive, generalized weakness, severely deconditioning.  May require skilled nursing facility at discharge.   Consultants:  Psychiatry  Procedures: None  Antimicrobials: None   Subjective: Patient was seen and examined at bedside.  Overnight events noted.   Patient appears sad, depressed but denies any suicidal homicidal ideations. Her clothes smells like urine.  Objective: Vitals:   08/23/21 0530 08/23/21 0600 08/23/21 0700 08/23/21 1000  BP: 131/72 134/77 132/73 134/85  Pulse: (!) 57 (!) 52 (!) 59 65  Resp: 15 16 (!) 21 18  Temp:      TempSrc:      SpO2: 97% 96% 97% 95%   No intake or output data in  the 24 hours ending 08/23/21 1356 There were no vitals filed for this visit.  Examination:  General exam: Appears comfortable, deconditioned, chronically ill looking, NAD. Respiratory system: Clear to auscultation bilaterally, respiratory effort normal, RR 15. Cardiovascular system: S1-S2 heard, regular rate and rhythm, no murmur.   Gastrointestinal system: Abdominal soft,  nontender, nondistended, BS +. Central nervous system: Alert and oriented x 2. No focal neurological deficits. Extremities: No edema, no cyanosis, no clubbing. Skin: No rashes, lesions or ulcers Psychiatry: Judgement and insight appear normal. Mood & affect appropriate.     Data Reviewed: I have personally reviewed following labs and imaging studies  CBC: Recent Labs  Lab 08/22/21 1643 08/23/21 0540  WBC 10.8* 5.3  NEUTROABS 8.7*  --   HGB 15.4* 12.1  HCT 46.7* 36.8  MCV 84.9 86.4  PLT 325 160   Basic Metabolic Panel: Recent Labs  Lab 08/22/21 1643 08/22/21 1842 08/23/21 0540  NA 140  --  139  K 2.4*  --  3.1*  CL 99  --  103  CO2 32  --  31  GLUCOSE 123*  --  85  BUN 10  --  7*  CREATININE 0.61  --  0.50  CALCIUM 9.3  --  8.6*  MG  --  1.9  --    GFR: CrCl cannot be calculated (Unknown ideal weight.). Liver Function Tests: Recent Labs  Lab 08/22/21 1643 08/23/21 0540  AST 21 16  ALT 13 10  ALKPHOS 79 62  BILITOT 0.6 0.7  PROT 6.9 5.6*  ALBUMIN 3.5 2.9*   No results for input(s): LIPASE, AMYLASE in the last 168 hours. No results for input(s): AMMONIA in the last 168 hours. Coagulation Profile: No results for input(s): INR, PROTIME in the last 168 hours. Cardiac Enzymes: Recent Labs  Lab 08/22/21 1643  CKTOTAL 82   BNP (last 3 results) No results for input(s): PROBNP in the last 8760 hours. HbA1C: No results for input(s): HGBA1C in the last 72 hours. CBG: No results for input(s): GLUCAP in the last 168 hours. Lipid Profile: No results for input(s): CHOL, HDL, LDLCALC, TRIG, CHOLHDL, LDLDIRECT in the last 72 hours. Thyroid Function Tests: Recent Labs    08/23/21 0540  TSH 4.422  FREET4 1.03   Anemia Panel: Recent Labs    08/23/21 0540  VITAMINB12 238  FOLATE 8.3   Sepsis Labs: No results for input(s): PROCALCITON, LATICACIDVEN in the last 168 hours.  Recent Results (from the past 240 hour(s))  Resp Panel by RT-PCR (Flu A&B, Covid)  Nasopharyngeal Swab     Status: None   Collection Time: 08/22/21  6:42 PM   Specimen: Nasopharyngeal Swab; Nasopharyngeal(NP) swabs in vial transport medium  Result Value Ref Range Status   SARS Coronavirus 2 by RT PCR NEGATIVE NEGATIVE Final    Comment: (NOTE) SARS-CoV-2 target nucleic acids are NOT DETECTED.  The SARS-CoV-2 RNA is generally detectable in upper respiratory specimens during the acute phase of infection. The lowest concentration of SARS-CoV-2 viral copies this assay can detect is 138 copies/mL. A negative result does not preclude SARS-Cov-2 infection and should not be used as the sole basis for treatment or other patient management decisions. A negative result may occur with  improper specimen collection/handling, submission of specimen other than nasopharyngeal swab, presence of viral mutation(s) within the areas targeted by this assay, and inadequate number of viral copies(<138 copies/mL). A negative result must be combined with clinical observations, patient history, and epidemiological information. The expected result is  Negative.  Fact Sheet for Patients:  EntrepreneurPulse.com.au  Fact Sheet for Healthcare Providers:  IncredibleEmployment.be  This test is no t yet approved or cleared by the [place] FDA and  has been authorized for detection and/or diagnosis of SARS-CoV-2 by FDA under an Emergency Use Authorization (EUA). This EUA will remain  in effect (meaning this test can be used) for the duration of the COVID-19 declaration under Section 564(b)(1) of the Act, 21 U.S.C.section 360bbb-3(b)(1), unless the authorization is terminated  or revoked sooner.       Influenza A by PCR NEGATIVE NEGATIVE Final   Influenza B by PCR NEGATIVE NEGATIVE Final    Comment: (NOTE) The Xpert Xpress SARS-CoV-2/FLU/RSV plus assay is intended as an aid in the diagnosis of influenza from Nasopharyngeal swab specimens and should not be  used as a sole basis for treatment. Nasal washings and aspirates are unacceptable for Xpert Xpress SARS-CoV-2/FLU/RSV testing.  Fact Sheet for Patients: EntrepreneurPulse.com.au  Fact Sheet for Healthcare Providers: IncredibleEmployment.be  This test is not yet approved or cleared by the [place] FDA and has been authorized for detection and/or diagnosis of SARS-CoV-2 by FDA under an Emergency Use Authorization (EUA). This EUA will remain in effect (meaning this test can be used) for the duration of the COVID-19 declaration under Section 564(b)(1) of the Act, 21 U.S.C. section 360bbb-3(b)(1), unless the authorization is terminated or revoked.  Performed at [facility], [place] [address] [place],  [address]     Radiology Studies: DG Chest 2 View  Result Date: 08/22/2021 CLINICAL DATA:  Fall. EXAM: CHEST - 2 VIEW COMPARISON:  Chest CT dated 03/06/2021. FINDINGS: No focal consolidation, pleural effusion, pneumothorax. Mild cardiomegaly. Atherosclerotic calcification of the aorta. Osteopenia with scoliosis and degenerative changes of the spine. Faint linear lucency through the posterior right tenth rib concerning for a fracture. Correlation with point tenderness recommended. Evaluation of the osseous structures is very limited due to osteopenia and scoliosis. IMPRESSION: 1. No acute cardiopulmonary process. 2. Findings concerning for a posterior right tenth rib fracture. Electronically Signed   By: Anner Crete M.D.   On: 08/22/2021 20:13   DG Thoracic Spine 2 View  Result Date: 08/22/2021 CLINICAL DATA:  Fall. EXAM: THORACIC SPINE 2 VIEWS; LUMBAR SPINE - COMPLETE 4+ VIEW COMPARISON:  None. FINDINGS: Thoracic spine: There is marked dextroconvex curvature of the mid/lower thoracic spine. There is no definite fracture or dislocation, although scoliosis limits evaluation. There surgical clips in the lower left neck. No other  significant bone abnormalities are identified. Lumbar spine: There is marked levoconvex curvature of the lumbar spine with multilevel degenerative change. No definite acute fracture or dislocation, although scoliosis limits evaluation. There are moderate degenerative changes throughout the lumbar spine. Soft tissues are unremarkable. IMPRESSION: 1. Marked thoracolumbar scoliosis. 2. No acute bony abnormality. 3. Multilevel degenerative changes. Electronically Signed   By: Ronney Asters M.D.   On: 08/22/2021 20:19   DG Lumbar Spine Complete  Result Date: 08/22/2021 CLINICAL DATA:  Fall. EXAM: THORACIC SPINE 2 VIEWS; LUMBAR SPINE - COMPLETE 4+ VIEW COMPARISON:  None. FINDINGS: Thoracic spine: There is marked dextroconvex curvature of the mid/lower thoracic spine. There is no definite fracture or dislocation, although scoliosis limits evaluation. There surgical clips in the lower left neck. No other significant bone abnormalities are identified. Lumbar spine: There is marked levoconvex curvature of the lumbar spine with multilevel degenerative change. No definite acute fracture or dislocation, although scoliosis limits evaluation. There are moderate degenerative changes throughout the  lumbar spine. Soft tissues are unremarkable. IMPRESSION: 1. Marked thoracolumbar scoliosis. 2. No acute bony abnormality. 3. Multilevel degenerative changes. Electronically Signed   By: Ronney Asters M.D.   On: 08/22/2021 20:19   DG Pelvis 1-2 Views  Result Date: 08/22/2021 CLINICAL DATA:  Initial evaluation for acute trauma, fall. EXAM: PELVIS - 1-2 VIEW COMPARISON:  None. FINDINGS: No acute fracture dislocation. Femoral heads in normal alignment within the acetabula. Femoral head height maintained. SI joints approximated. No pubic diastasis. Underlying osteopenia noted. Severe lumbar levoscoliosis with multilevel spondylosis noted. Visualized soft tissues demonstrate no acute finding. IMPRESSION: No acute osseous abnormality  about the pelvis. Electronically Signed   By: Jeannine Boga M.D.   On: 08/22/2021 20:13   CT HEAD WO CONTRAST (5MM)  Result Date: 08/22/2021 CLINICAL DATA:  Head trauma, moderate to severe. Increasing falls and difficulty walking. EXAM: CT HEAD WITHOUT CONTRAST CT CERVICAL SPINE WITHOUT CONTRAST TECHNIQUE: Multidetector CT imaging of the head and cervical spine was performed following the standard protocol without intravenous contrast. Multiplanar CT image reconstructions of the cervical spine were also generated. COMPARISON:  CT head 03/04/2021 and 03/13/2021, CTA 03/14/2021 and MRI 03/14/2021. FINDINGS: CT HEAD FINDINGS Brain: Again demonstrated is a hyperdense mass with peripheral calcifications at the right cerebellopontine angle, measuring 2.2 x 1.6 cm on image 6/3, consistent with a partially thrombosed right PICA aneurysm. This does not appear significantly changed from previous CTs. There is no evidence of acute intracranial hemorrhage, mass lesion, brain edema or extra-axial fluid collection. Stable generalized atrophy with disproportionate dilatation of the lateral and 3rd ventricles, similar to previous study. There is confluent low-density within the periventricular white matter. There is no CT evidence of acute cortical infarction. Vascular: As above, partially thrombosed right PICA aneurysm, grossly stable. There is a vascular stent within the right vertebrobasilar system. Skull: Previous right suboccipital craniotomy. No acute skull findings. Sinuses/Orbits: Mild chronic ethmoid sinus mucosal thickening without air-fluid levels. The additional visualized paranasal sinuses, mastoid air cells and middle ears are clear. No significant orbital findings. Other: None. CT CERVICAL SPINE FINDINGS Alignment: Mild degenerative anterolisthesis at C5-6. Skull base and vertebrae: No evidence of acute cervical spine fracture or traumatic subluxation. Soft tissues and spinal canal: No prevertebral fluid  or swelling. No visible canal hematoma. Disc levels: Multilevel spondylosis with disc space narrowing, uncinate spurring and facet hypertrophy. No large disc herniation identified. Upper chest: Scarring at both lung apices. Other: Postsurgical changes at the right skull base and partially thrombosed right PICA aneurysm as noted above. IMPRESSION: 1. No definite acute intracranial findings. 2. Similar appearance of partially thrombosed right PICA aneurysm with adjacent vascular stent and postsurgical changes. 3. Similar ventriculomegaly which may relate to normal pressure hydrocephalus or central atrophy. 4. No evidence of acute cervical spine fracture, traumatic subluxation or static signs of instability. Multilevel cervical spondylosis. Electronically Signed   By: Richardean Sale M.D.   On: 08/22/2021 19:18   CT Cervical Spine Wo Contrast  Result Date: 08/22/2021 CLINICAL DATA:  Head trauma, moderate to severe. Increasing falls and difficulty walking. EXAM: CT HEAD WITHOUT CONTRAST CT CERVICAL SPINE WITHOUT CONTRAST TECHNIQUE: Multidetector CT imaging of the head and cervical spine was performed following the standard protocol without intravenous contrast. Multiplanar CT image reconstructions of the cervical spine were also generated. COMPARISON:  CT head 03/04/2021 and 03/13/2021, CTA 03/14/2021 and MRI 03/14/2021. FINDINGS: CT HEAD FINDINGS Brain: Again demonstrated is a hyperdense mass with peripheral calcifications at the right cerebellopontine angle, measuring 2.2 x 1.6  cm on image 6/3, consistent with a partially thrombosed right PICA aneurysm. This does not appear significantly changed from previous CTs. There is no evidence of acute intracranial hemorrhage, mass lesion, brain edema or extra-axial fluid collection. Stable generalized atrophy with disproportionate dilatation of the lateral and 3rd ventricles, similar to previous study. There is confluent low-density within the periventricular white  matter. There is no CT evidence of acute cortical infarction. Vascular: As above, partially thrombosed right PICA aneurysm, grossly stable. There is a vascular stent within the right vertebrobasilar system. Skull: Previous right suboccipital craniotomy. No acute skull findings. Sinuses/Orbits: Mild chronic ethmoid sinus mucosal thickening without air-fluid levels. The additional visualized paranasal sinuses, mastoid air cells and middle ears are clear. No significant orbital findings. Other: None. CT CERVICAL SPINE FINDINGS Alignment: Mild degenerative anterolisthesis at C5-6. Skull base and vertebrae: No evidence of acute cervical spine fracture or traumatic subluxation. Soft tissues and spinal canal: No prevertebral fluid or swelling. No visible canal hematoma. Disc levels: Multilevel spondylosis with disc space narrowing, uncinate spurring and facet hypertrophy. No large disc herniation identified. Upper chest: Scarring at both lung apices. Other: Postsurgical changes at the right skull base and partially thrombosed right PICA aneurysm as noted above. IMPRESSION: 1. No definite acute intracranial findings. 2. Similar appearance of partially thrombosed right PICA aneurysm with adjacent vascular stent and postsurgical changes. 3. Similar ventriculomegaly which may relate to normal pressure hydrocephalus or central atrophy. 4. No evidence of acute cervical spine fracture, traumatic subluxation or static signs of instability. Multilevel cervical spondylosis. Electronically Signed   By: Richardean Sale M.D.   On: 08/22/2021 19:18     Scheduled Meds:  enoxaparin (LOVENOX) injection  40 mg Subcutaneous Q24H   levETIRAcetam  500 mg Oral BID   mirtazapine  15 mg Oral QHS   Continuous Infusions:  lactated ringers 100 mL/hr at 08/23/21 0048     LOS: 0 days    Time spent: 35 mins.    Shawna Clamp, MD [facility]   If 7PM-7AM, please contact night-coverage

## 2021-08-23 NOTE — Assessment & Plan Note (Addendum)
Brought in by daughter.  Was covered in urine and feces at arrival.  Reportedly lives at home, unable to take care of herself and able to ambulate and therefore mostly spent time in the day in the chair.  Potentially had a fall on Wednesday. Etiology is not clear. Suspect depression playing a larger role. Initiate Remeron and consult psychiatry.

## 2021-08-23 NOTE — Assessment & Plan Note (Signed)
Incidentally seen.  No chest pain.  Monitor

## 2021-08-24 DIAGNOSIS — R627 Adult failure to thrive: Secondary | ICD-10-CM | POA: Diagnosis not present

## 2021-08-24 LAB — CBC
HCT: 38.8 % (ref 36.0–46.0)
Hemoglobin: 12.4 g/dL (ref 12.0–15.0)
MCH: 28.3 pg (ref 26.0–34.0)
MCHC: 32 g/dL (ref 30.0–36.0)
MCV: 88.6 fL (ref 80.0–100.0)
Platelets: 247 10*3/uL (ref 150–400)
RBC: 4.38 MIL/uL (ref 3.87–5.11)
RDW: 13.2 % (ref 11.5–15.5)
WBC: 4.2 10*3/uL (ref 4.0–10.5)
nRBC: 0 % (ref 0.0–0.2)

## 2021-08-24 LAB — BASIC METABOLIC PANEL
Anion gap: 4 — ABNORMAL LOW (ref 5–15)
BUN: 7 mg/dL — ABNORMAL LOW (ref 8–23)
CO2: 31 mmol/L (ref 22–32)
Calcium: 9.1 mg/dL (ref 8.9–10.3)
Chloride: 106 mmol/L (ref 98–111)
Creatinine, Ser: 0.5 mg/dL (ref 0.44–1.00)
GFR, Estimated: >60 mL/min (ref >60)
Glucose, Bld: 77 mg/dL (ref 70–99)
Potassium: 4.2 mmol/L (ref 3.5–5.1)
Sodium: 141 mmol/L (ref 135–145)

## 2021-08-24 LAB — MAGNESIUM: Magnesium: 1.8 mg/dL (ref 1.7–2.4)

## 2021-08-24 LAB — PHOSPHORUS: Phosphorus: 3.2 mg/dL (ref 2.5–4.6)

## 2021-08-24 NOTE — Evaluation (Signed)
Occupational Therapy Evaluation Patient Details Name: Elaine Rivera MRN: [id number] DOB: 19-Jul-1946 Today's Date: 08/24/2021   History of Present Illness 75 yo admit with complains of increased weakness and fatigue and unable to care for herself or even get off the couch. Arrived very soiled in feces and urine. PMH: for CVA and scoliosis   Clinical Impression   Patient is currently requiring assistance with ADLs including total assist with toileting and Moderate assist with transfer to [facility], total assist with LE dressing, maximum assist with bathing, and minimal to moderate assist with eating and grooming in bed.  Bed mobility required moderate assist and pt fatigues quickly.   Current level of function is below patient's typical baseline when she returned home from [facility] some months ago and daughter reports insidious progression of weakness and inability to care for self.  Daughter reports at least 4 falls in past year but stated that the pt does not consistently report falls or struggled to daughter.    During this evaluation, patient was limited by decreased mood, profound generalized weakness, very poor activity tolerance, poor sitting balance with posterior and Left leans and losses of balance and poor standing balance and tolerance, all of which has the potential to impact patient's safety and independence during functional mobility, as well as performance for ADLs.  Patient lives alone and is unable to care for herself.  No consistent supervision and assistance is available.  Patient demonstrates fair rehab potential, and should benefit from continued skilled occupational therapy services while in acute care to maximize safety, independence and quality of life at home.  Continued occupational therapy services in a SNF setting prior to return home is recommended.  Pt is willing to go anywhere but reports that she did enjoy [facility].  ?    Recommendations for follow up therapy are one  component of a multi-disciplinary discharge planning process, led by the attending physician.  Recommendations may be updated based on patient status, additional functional criteria and insurance authorization.   Follow Up Recommendations  Skilled nursing-short term rehab (<3 hours/day) (Pt may need to consider bridge to ALF vs Nursing home based on progress at [facility].)    Assistance Recommended at Discharge Frequent or constant Supervision/Assistance  Functional Status Assessment  Patient has had a recent decline in their functional status and demonstrates the ability to make significant improvements in function in a reasonable and predictable amount of time.  Equipment Recommendations  BSC/3in1    Recommendations for Other Services       Precautions / Restrictions Precautions Precautions: Fall Restrictions Weight Bearing Restrictions: No      Mobility Bed Mobility Overal bed mobility: Needs Assistance Bed Mobility: Supine to Sit     Supine to sit: Mod assist     General bed mobility comments: step by step cues for log roll to address back pain with Min As, and need of Mod As to raise trunk from bed and Min As to advance LEs. Patient Response: Anxious  Transfers Overall transfer level: Needs assistance Equipment used: Rolling walker (2 wheels) Transfers: Sit to/from Stand Sit to Stand: Mod assist;Min assist           General transfer comment: Stood from EOB with Mod As. Pivoted to recliner with Mod As.      Balance Overall balance assessment: History of Falls;Needs assistance (Daughter reports at least 4 falls in past year, with 1 very recently.) Sitting-balance support: Bilateral upper extremity supported;Feet supported Sitting balance-Leahy Scale: Poor   Postural  control: Posterior lean;Left lateral lean Standing balance support: Bilateral upper extremity supported;Reliant on assistive device for balance;During functional activity Standing balance-Leahy Scale:  Poor                             ADL either performed or assessed with clinical judgement   ADL Overall ADL's : Needs assistance/impaired Eating/Feeding: Minimal assistance;Sitting;Cueing for sequencing;Set up   Grooming: Wash/dry hands;Sitting;Set up;Min guard Grooming Details (indicate cue type and reason): Pt has been unable to complete hair care at home. Hair is completely matted. Dtr verbalized plan to cut heair short to make easier for pt and pt agreeable to this. Upper Body Bathing: Minimal assistance;Bed level Upper Body Bathing Details (indicate cue type and reason): Sponge Lower Body Bathing: Total assistance;Bed level;Sit to/from stand   Upper Body Dressing : Bed level;Minimal assistance   Lower Body Dressing: Maximal assistance;Sitting/lateral leans Lower Body Dressing Details (indicate cue type and reason): Pt attempting figure 4 position to don socks, but with frequent losses of balance posteriorly and to LT side. Pt keeps focused on sock task at hand without attempts to correct balance. Socks getting caught on overly long toe nails. Pt required Max As to complete job with additional Mod As for sitting. Toilet Transfer: Stand-pivot;Rolling walker (2 wheels);BSC/3in1 [employer] forces technical officer Details (indicate cue type and reason): Pt assisted to recliner then stated need to void.  Pt stood from recliner with Mod assist to RW, pivoted to [facility] with Mod As. Cues for BUE reach back and controlled descent with Min As. Stood from [facility] with Min As. Toileting- Clothing Manipulation and Hygiene: Total assistance;Sit to/from stand Toileting - Clothing Manipulation Details (indicate cue type and reason): Pt able to stand and grasp RW with increased time to get balance due to keeping feet anterior to body. Pt then required Total Assist for peri hygiene.     Functional mobility during ADLs: Minimal assistance;Moderate assistance;Rolling walker (2 wheels)       Vision   Vision  Assessment?: No apparent visual deficits     Perception Perception Perception: Within Functional Limits   Praxis Praxis Praxis: Intact    Pertinent Vitals/Pain Pain Assessment: Faces Faces Pain Scale: Hurts a little bit Pain Location: Chronic back from scoliosis. Pain Descriptors / Indicators: Grimacing Pain Intervention(s): Limited activity within patient's tolerance;Repositioned;Monitored during session     Hand Dominance Right   Extremity/Trunk Assessment Upper Extremity Assessment Upper Extremity Assessment: Generalized weakness (Very weak with muscle atrophy noted in Bil hands LT>RT.)   Lower Extremity Assessment Lower Extremity Assessment: Defer to PT evaluation   Cervical / Trunk Assessment Cervical / Trunk Assessment: Other exceptions Cervical / Trunk Exceptions: scoliosis   Communication Communication Communication: No difficulties   Cognition Arousal/Alertness: Awake/alert Behavior During Therapy: Flat affect;WFL for tasks assessed/performed Overall Cognitive Status: Difficult to assess                                 General Comments: Daughter lives 30 min away and unable to supervise pt as she would like. Pt is alert and oriented x 4 for this assessment, but too lethargic to participate or even stay awake to complete a sentence this morning with 2 attempts to work with pt. Pt reports that she has no memory of OT entering room and speaking with her, although pt verbally responded to OT.     General Comments  Exercises     Shoulder Instructions      Home Living Family/patient expects to be discharged to:: Skilled nursing facility (Has been to [facility] and stated she liked it.) Living Arrangements: Alone                                      Prior Functioning/Environment Prior Level of Function : Needs assist  Cognitive Assist : ADLs (cognitive)   ADLs (Cognitive): Intermittent cues Physical Assist : Mobility  (physical);ADLs (physical) Mobility (physical): Bed mobility;Transfers;Gait;Stairs ADLs (physical): Feeding;Grooming;Bathing;Dressing;Toileting;IADLs Mobility Comments: Pt's daughter Gari Crown available in room. Per daughter who lives 30 min away, pt has shown decrease in all function since return home from [facility] several months ago, to point of being completely unable to care for herself and sleeps all days and most of the night.  Pt endorsed that she has been confined to her couch for "weeks". ADLs Comments: Daughter inquiring re: at home caregivers.        OT Problem List: Impaired balance (sitting and/or standing);Decreased knowledge of use of DME or AE;Decreased activity tolerance;Decreased cognition;Pain;Decreased strength      OT Treatment/Interventions: Self-care/ADL training;Therapeutic exercise;Therapeutic activities;Cognitive remediation/compensation;DME and/or AE instruction;Patient/family education;Balance training;Energy conservation    OT Goals(Current goals can be found in the care plan section) Acute Rehab OT Goals Patient Stated Goal: Per daughter, resources to assist pt after discharge. OT Goal Formulation: With family Time For Goal Achievement: 09/07/21 Potential to Achieve Goals: Fair ADL Goals Pt Will Perform Lower Body Bathing: with set-up;with min guard assist;with adaptive equipment;sitting/lateral leans;sit to/from stand;bed level (using safest, least restrictive positoin) Pt Will Perform Lower Body Dressing: with adaptive equipment;with set-up;with supervision;sitting/lateral leans;sit to/from stand;bed level (using safest, least restrictive positioning.) Pt Will Transfer to Toilet: with supervision;ambulating Pt Will Perform Toileting - Clothing Manipulation and hygiene: with caregiver independent in assisting;with min guard assist;sitting/lateral leans Pt/caregiver will Perform Home Exercise Program: With Supervision;Both right and left upper extremity;Increased  strength (gentle due to scoliosis, chronic back pain.) Additional ADL Goal #1: Pt will improve sitting balance to good static and fair+ dynamic, tolerating at least 10 min sitting unsupported in order to increase safety and participation during seated ADLs.  OT Frequency: Min 2X/week   Barriers to D/C: Decreased caregiver support          Co-evaluation              AM-PAC OT "6 Clicks" Daily Activity     Outcome Measure Help from another person eating meals?: A Little Help from another person taking care of personal grooming?: A Lot (Total for hair care) Help from another person toileting, which includes using toliet, bedpan, or urinal?: Total Help from another person bathing (including washing, rinsing, drying)?: A Lot Help from another person to put on and taking off regular upper body clothing?: A Little Help from another person to put on and taking off regular lower body clothing?: Total 6 Click Score: 12   End of Session Equipment Utilized During Treatment: Gait belt;Rolling walker (2 wheels) Nurse Communication: Mobility status  Activity Tolerance: Patient limited by fatigue Patient left: in chair;with call bell/phone within reach;with chair alarm set;with family/visitor present  OT Visit Diagnosis: Unsteadiness on feet (R26.81);Adult, failure to thrive (R62.7);Pain;History of falling (Z91.81);Repeated falls (R29.6);Muscle weakness (generalized) (M62.81) Pain - part of body:  (back)                Time: 6789-3810  OT Time Calculation (min): 34 min Charges:  OT General Charges $OT Visit: 1 Visit OT Evaluation $OT Eval Low Complexity: 1 Low OT Treatments $Self Care/Home Management : 8-22 mins  Anderson Malta, OT Acute Rehab Services Office: [phone] 08/24/2021  Julien Girt 08/24/2021, 2:15 PM

## 2021-08-24 NOTE — Progress Notes (Signed)
PROGRESS NOTE    Elaine Rivera  FXT:[id number] DOB: 1946-09-07 DOA: 08/22/2021 PCP: System, Provider Not In   Brief Narrative:  This 75 years old female with PMH significant for depression, anemia, scoliosis, GERD, hypertension presented in the ED with complaints of generalized fatigue and tiredness.  Patient was brought in the ED as she was not able to take care of herself.  Patient has chronic pain which has been progressively worse and she is not able to ambulate from her couch.  She was spending most of the time in the same position in the recliner and she was also found covered in the feces and urine.  Patient reports feeling depressed but denies any suicidal/ homicidal ideations.  She also reported having difficulty going to sleep and staying asleep. Patient is admitted for generalized weakness, failure to thrive, major depression.  Psych is consulted.  Assessment & Plan:   Principal Problem:   Failure to thrive in adult Active Problems:   Scoliosis   Depression   Cerebral aneurysm   Hypokalemia   Lung nodule   Seizure (HCC)   CVA (cerebral vascular accident) ([place])   Right 10th rib fracture   Physical deconditioning  Failure to thrive: Patient was brought in by daughter, Her clothes were soaked in urine and feces at arrival. Patient lives at home but unable to take care of herself and now unable to ambulate, mostly remains in her recliner.   Patient also had a fall few days back.  Suspect could be secondary to depression. Continue Remeron 7.5 mg daily. Psychiatry consulted,  recommended to continue Remeron. Patient is not appropriate for inpatient psych hospitalization. Dietitian consult.  Hypokalemia: Replaced and improved.  Continue to monitor.  Major depression: It appears like patient is having significant depressive episode. She denies any suicidal / Homicidal ideations. Continue Remeron 7.5 mg daily. Psychiatry consulted,  recommended to continue the  same.  History of CVA: Patient does have history of ischemic CVA,  there is no focal neurological deficits on exam. She does not take any aspirin or statins.  Cerebral aneurysm: Prior history of coiling and embolization.  Physical deconditioning: Patient reported generalized weakness likely due to her chronic pain. X-rays negative for any acute fracture or dislocation. Exam is nonfocal. PT and OT eval.  Right 10th rib fracture: Incidental finding.  Patient denies any chest pain.  History of seizure disorders: Patient was not taking Keppra but will resume Keppra. Continue Keppra.  Lung nodule: She is found to have 7 mm right lobe lung nodules.   CT scan is recommended in 3 to 6 months. Outpatient follow up.  Scoliosis Causing chronic pain.  Continue gentle narcotic regimen.  DVT prophylaxis: Lovenox. Code Status: Full code. Family Communication: No family at bed side. Disposition Plan:    Status is: Inpatient  Remains inpatient appropriate because: Failure to thrive, Depression, Severe Deconditioning.  Patient is admitted for failure to thrive, generalized weakness, severely deconditioning.  May require skilled nursing facility at discharge.   Consultants:  Psychiatry  Procedures: None  Antimicrobials: None   Subjective: Patient was seen and examined at bedside.  Overnight events noted.   Patient reports feeling much improved.  She is alert and oriented x 2. She reports slept better last night,  feels more improved.  Objective: Vitals:   08/23/21 1716 08/23/21 2119 08/24/21 0126 08/24/21 0533  BP: (!) 142/76 128/74 128/73 (!) 144/84  Pulse: (!) 57 67 (!) 54 (!) 59  Resp: 17 16 16  18  Temp: 97.9 F (36.6 C) 97.7 F (36.5 C) (!) 97.3 F (36.3 C) (!) 97.4 F (36.3 C)  TempSrc: Oral Oral Oral Oral  SpO2: 97% 97% 96% 100%    Intake/Output Summary (Last 24 hours) at 08/24/2021 1056 Last data filed at 08/24/2021 0135 Gross per 24 hour  Intake --   Output 1000 ml  Net -1000 ml   There were no vitals filed for this visit.  Examination:  General exam: Deconditioned.  Chronically ill looking.  Appears comfortable, NAD. Respiratory system: Clear to auscultation bilaterally, respiratory effort normal, RR 15. Cardiovascular system: S1-S2 heard, regular rate and rhythm, no murmur.   Gastrointestinal system: Abdomen is soft, nontender, nondistended, BS +. Central nervous system: Alert and oriented x 2. No focal neurological deficits. Extremities: No edema, no cyanosis, no clubbing. Skin: No rashes, lesions or ulcers Psychiatry: Judgement and insight appear normal. Mood & affect appropriate.     Data Reviewed: I have personally reviewed following labs and imaging studies  CBC: Recent Labs  Lab 08/22/21 1643 08/23/21 0540 08/24/21 0726  WBC 10.8* 5.3 4.2  NEUTROABS 8.7*  --   --   HGB 15.4* 12.1 12.4  HCT 46.7* 36.8 38.8  MCV 84.9 86.4 88.6  PLT 325 250 588   Basic Metabolic Panel: Recent Labs  Lab 08/22/21 1643 08/22/21 1842 08/23/21 0540 08/24/21 0532  NA 140  --  139 141  K 2.4*  --  3.1* 4.2  CL 99  --  103 106  CO2 32  --  31 31  GLUCOSE 123*  --  85 77  BUN 10  --  7* 7*  CREATININE 0.61  --  0.50 0.50  CALCIUM 9.3  --  8.6* 9.1  MG  --  1.9  --  1.8  PHOS  --   --   --  3.2   GFR: CrCl cannot be calculated (Unknown ideal weight.). Liver Function Tests: Recent Labs  Lab 08/22/21 1643 08/23/21 0540  AST 21 16  ALT 13 10  ALKPHOS 79 62  BILITOT 0.6 0.7  PROT 6.9 5.6*  ALBUMIN 3.5 2.9*   No results for input(s): LIPASE, AMYLASE in the last 168 hours. No results for input(s): AMMONIA in the last 168 hours. Coagulation Profile: No results for input(s): INR, PROTIME in the last 168 hours. Cardiac Enzymes: Recent Labs  Lab 08/22/21 1643  CKTOTAL 82   BNP (last 3 results) No results for input(s): PROBNP in the last 8760 hours. HbA1C: No results for input(s): HGBA1C in the last 72  hours. CBG: No results for input(s): GLUCAP in the last 168 hours. Lipid Profile: No results for input(s): CHOL, HDL, LDLCALC, TRIG, CHOLHDL, LDLDIRECT in the last 72 hours. Thyroid Function Tests: Recent Labs    08/23/21 0540  TSH 4.422  FREET4 1.03   Anemia Panel: Recent Labs    08/23/21 0540  VITAMINB12 238  FOLATE 8.3   Sepsis Labs: No results for input(s): PROCALCITON, LATICACIDVEN in the last 168 hours.  Recent Results (from the past 240 hour(s))  Resp Panel by RT-PCR (Flu A&B, Covid) Nasopharyngeal Swab     Status: None   Collection Time: 08/22/21  6:42 PM   Specimen: Nasopharyngeal Swab; Nasopharyngeal(NP) swabs in vial transport medium  Result Value Ref Range Status   SARS Coronavirus 2 by RT PCR NEGATIVE NEGATIVE Final    Comment: (NOTE) SARS-CoV-2 target nucleic acids are NOT DETECTED.  The SARS-CoV-2 RNA is generally detectable in upper respiratory specimens during the  acute phase of infection. The lowest concentration of SARS-CoV-2 viral copies this assay can detect is 138 copies/mL. A negative result does not preclude SARS-Cov-2 infection and should not be used as the sole basis for treatment or other patient management decisions. A negative result may occur with  improper specimen collection/handling, submission of specimen other than nasopharyngeal swab, presence of viral mutation(s) within the areas targeted by this assay, and inadequate number of viral copies(<138 copies/mL). A negative result must be combined with clinical observations, patient history, and epidemiological information. The expected result is Negative.  Fact Sheet for Patients:  EntrepreneurPulse.com.au  Fact Sheet for Healthcare Providers:  IncredibleEmployment.be  This test is no t yet approved or cleared by the [place] FDA and  has been authorized for detection and/or diagnosis of SARS-CoV-2 by FDA under an Emergency Use Authorization  (EUA). This EUA will remain  in effect (meaning this test can be used) for the duration of the COVID-19 declaration under Section 564(b)(1) of the Act, 21 U.S.C.section 360bbb-3(b)(1), unless the authorization is terminated  or revoked sooner.       Influenza A by PCR NEGATIVE NEGATIVE Final   Influenza B by PCR NEGATIVE NEGATIVE Final    Comment: (NOTE) The Xpert Xpress SARS-CoV-2/FLU/RSV plus assay is intended as an aid in the diagnosis of influenza from Nasopharyngeal swab specimens and should not be used as a sole basis for treatment. Nasal washings and aspirates are unacceptable for Xpert Xpress SARS-CoV-2/FLU/RSV testing.  Fact Sheet for Patients: EntrepreneurPulse.com.au  Fact Sheet for Healthcare Providers: IncredibleEmployment.be  This test is not yet approved or cleared by the [place] FDA and has been authorized for detection and/or diagnosis of SARS-CoV-2 by FDA under an Emergency Use Authorization (EUA). This EUA will remain in effect (meaning this test can be used) for the duration of the COVID-19 declaration under Section 564(b)(1) of the Act, 21 U.S.C. section 360bbb-3(b)(1), unless the authorization is terminated or revoked.  Performed at [facility], [place] [address] [place] [address]     Radiology Studies: DG Chest 2 View  Result Date: 08/22/2021 CLINICAL DATA:  Fall. EXAM: CHEST - 2 VIEW COMPARISON:  Chest CT dated 03/06/2021. FINDINGS: No focal consolidation, pleural effusion, pneumothorax. Mild cardiomegaly. Atherosclerotic calcification of the aorta. Osteopenia with scoliosis and degenerative changes of the spine. Faint linear lucency through the posterior right tenth rib concerning for a fracture. Correlation with point tenderness recommended. Evaluation of the osseous structures is very limited due to osteopenia and scoliosis. IMPRESSION: 1. No acute cardiopulmonary process. 2. Findings  concerning for a posterior right tenth rib fracture. Electronically Signed   By: Anner Crete M.D.   On: 08/22/2021 20:13   DG Thoracic Spine 2 View  Result Date: 08/22/2021 CLINICAL DATA:  Fall. EXAM: THORACIC SPINE 2 VIEWS; LUMBAR SPINE - COMPLETE 4+ VIEW COMPARISON:  None. FINDINGS: Thoracic spine: There is marked dextroconvex curvature of the mid/lower thoracic spine. There is no definite fracture or dislocation, although scoliosis limits evaluation. There surgical clips in the lower left neck. No other significant bone abnormalities are identified. Lumbar spine: There is marked levoconvex curvature of the lumbar spine with multilevel degenerative change. No definite acute fracture or dislocation, although scoliosis limits evaluation. There are moderate degenerative changes throughout the lumbar spine. Soft tissues are unremarkable. IMPRESSION: 1. Marked thoracolumbar scoliosis. 2. No acute bony abnormality. 3. Multilevel degenerative changes. Electronically Signed   By: Ronney Asters M.D.   On: 08/22/2021 20:19   DG Lumbar  Spine Complete  Result Date: 08/22/2021 CLINICAL DATA:  Fall. EXAM: THORACIC SPINE 2 VIEWS; LUMBAR SPINE - COMPLETE 4+ VIEW COMPARISON:  None. FINDINGS: Thoracic spine: There is marked dextroconvex curvature of the mid/lower thoracic spine. There is no definite fracture or dislocation, although scoliosis limits evaluation. There surgical clips in the lower left neck. No other significant bone abnormalities are identified. Lumbar spine: There is marked levoconvex curvature of the lumbar spine with multilevel degenerative change. No definite acute fracture or dislocation, although scoliosis limits evaluation. There are moderate degenerative changes throughout the lumbar spine. Soft tissues are unremarkable. IMPRESSION: 1. Marked thoracolumbar scoliosis. 2. No acute bony abnormality. 3. Multilevel degenerative changes. Electronically Signed   By: Ronney Asters M.D.   On: 08/22/2021  20:19   DG Pelvis 1-2 Views  Result Date: 08/22/2021 CLINICAL DATA:  Initial evaluation for acute trauma, fall. EXAM: PELVIS - 1-2 VIEW COMPARISON:  None. FINDINGS: No acute fracture dislocation. Femoral heads in normal alignment within the acetabula. Femoral head height maintained. SI joints approximated. No pubic diastasis. Underlying osteopenia noted. Severe lumbar levoscoliosis with multilevel spondylosis noted. Visualized soft tissues demonstrate no acute finding. IMPRESSION: No acute osseous abnormality about the pelvis. Electronically Signed   By: Jeannine Boga M.D.   On: 08/22/2021 20:13   CT HEAD WO CONTRAST (5MM)  Result Date: 08/22/2021 CLINICAL DATA:  Head trauma, moderate to severe. Increasing falls and difficulty walking. EXAM: CT HEAD WITHOUT CONTRAST CT CERVICAL SPINE WITHOUT CONTRAST TECHNIQUE: Multidetector CT imaging of the head and cervical spine was performed following the standard protocol without intravenous contrast. Multiplanar CT image reconstructions of the cervical spine were also generated. COMPARISON:  CT head 03/04/2021 and 03/13/2021, CTA 03/14/2021 and MRI 03/14/2021. FINDINGS: CT HEAD FINDINGS Brain: Again demonstrated is a hyperdense mass with peripheral calcifications at the right cerebellopontine angle, measuring 2.2 x 1.6 cm on image 6/3, consistent with a partially thrombosed right PICA aneurysm. This does not appear significantly changed from previous CTs. There is no evidence of acute intracranial hemorrhage, mass lesion, brain edema or extra-axial fluid collection. Stable generalized atrophy with disproportionate dilatation of the lateral and 3rd ventricles, similar to previous study. There is confluent low-density within the periventricular white matter. There is no CT evidence of acute cortical infarction. Vascular: As above, partially thrombosed right PICA aneurysm, grossly stable. There is a vascular stent within the right vertebrobasilar system. Skull:  Previous right suboccipital craniotomy. No acute skull findings. Sinuses/Orbits: Mild chronic ethmoid sinus mucosal thickening without air-fluid levels. The additional visualized paranasal sinuses, mastoid air cells and middle ears are clear. No significant orbital findings. Other: None. CT CERVICAL SPINE FINDINGS Alignment: Mild degenerative anterolisthesis at C5-6. Skull base and vertebrae: No evidence of acute cervical spine fracture or traumatic subluxation. Soft tissues and spinal canal: No prevertebral fluid or swelling. No visible canal hematoma. Disc levels: Multilevel spondylosis with disc space narrowing, uncinate spurring and facet hypertrophy. No large disc herniation identified. Upper chest: Scarring at both lung apices. Other: Postsurgical changes at the right skull base and partially thrombosed right PICA aneurysm as noted above. IMPRESSION: 1. No definite acute intracranial findings. 2. Similar appearance of partially thrombosed right PICA aneurysm with adjacent vascular stent and postsurgical changes. 3. Similar ventriculomegaly which may relate to normal pressure hydrocephalus or central atrophy. 4. No evidence of acute cervical spine fracture, traumatic subluxation or static signs of instability. Multilevel cervical spondylosis. Electronically Signed   By: Richardean Sale M.D.   On: 08/22/2021 19:18   CT Cervical  Spine Wo Contrast  Result Date: 08/22/2021 CLINICAL DATA:  Head trauma, moderate to severe. Increasing falls and difficulty walking. EXAM: CT HEAD WITHOUT CONTRAST CT CERVICAL SPINE WITHOUT CONTRAST TECHNIQUE: Multidetector CT imaging of the head and cervical spine was performed following the standard protocol without intravenous contrast. Multiplanar CT image reconstructions of the cervical spine were also generated. COMPARISON:  CT head 03/04/2021 and 03/13/2021, CTA 03/14/2021 and MRI 03/14/2021. FINDINGS: CT HEAD FINDINGS Brain: Again demonstrated is a hyperdense mass with  peripheral calcifications at the right cerebellopontine angle, measuring 2.2 x 1.6 cm on image 6/3, consistent with a partially thrombosed right PICA aneurysm. This does not appear significantly changed from previous CTs. There is no evidence of acute intracranial hemorrhage, mass lesion, brain edema or extra-axial fluid collection. Stable generalized atrophy with disproportionate dilatation of the lateral and 3rd ventricles, similar to previous study. There is confluent low-density within the periventricular white matter. There is no CT evidence of acute cortical infarction. Vascular: As above, partially thrombosed right PICA aneurysm, grossly stable. There is a vascular stent within the right vertebrobasilar system. Skull: Previous right suboccipital craniotomy. No acute skull findings. Sinuses/Orbits: Mild chronic ethmoid sinus mucosal thickening without air-fluid levels. The additional visualized paranasal sinuses, mastoid air cells and middle ears are clear. No significant orbital findings. Other: None. CT CERVICAL SPINE FINDINGS Alignment: Mild degenerative anterolisthesis at C5-6. Skull base and vertebrae: No evidence of acute cervical spine fracture or traumatic subluxation. Soft tissues and spinal canal: No prevertebral fluid or swelling. No visible canal hematoma. Disc levels: Multilevel spondylosis with disc space narrowing, uncinate spurring and facet hypertrophy. No large disc herniation identified. Upper chest: Scarring at both lung apices. Other: Postsurgical changes at the right skull base and partially thrombosed right PICA aneurysm as noted above. IMPRESSION: 1. No definite acute intracranial findings. 2. Similar appearance of partially thrombosed right PICA aneurysm with adjacent vascular stent and postsurgical changes. 3. Similar ventriculomegaly which may relate to normal pressure hydrocephalus or central atrophy. 4. No evidence of acute cervical spine fracture, traumatic subluxation or static  signs of instability. Multilevel cervical spondylosis. Electronically Signed   By: Richardean Sale M.D.   On: 08/22/2021 19:18     Scheduled Meds:  enoxaparin (LOVENOX) injection  40 mg Subcutaneous Q24H   levETIRAcetam  500 mg Oral BID   mirtazapine  7.5 mg Oral QHS   Continuous Infusions:  lactated ringers 100 mL/hr at 08/24/21 0659     LOS: 1 day    Time spent: 25 mins.    Shawna Clamp, MD [facility]   If 7PM-7AM, please contact night-coverage

## 2021-08-24 NOTE — Plan of Care (Signed)
  Problem: Education: Goal: Knowledge of General Education information will improve Description: Including pain rating scale, medication(s)/side effects and non-pharmacologic comfort measures Outcome: Progressing   Problem: Health Behavior/Discharge Planning: Goal: Ability to manage health-related needs will improve Outcome: Progressing   Problem: Clinical Measurements: Goal: Ability to maintain clinical measurements within normal limits will improve Outcome: Progressing Goal: Will remain free from infection Outcome: Progressing Goal: Diagnostic test results will improve 08/24/2021 0044 by Delton See, RN Outcome: Progressing 08/23/2021 1958 by Delton See, RN Outcome: Not Progressing Goal: Respiratory complications will improve Outcome: Progressing Goal: Cardiovascular complication will be avoided Outcome: Progressing   Problem: Activity: Goal: Risk for activity intolerance will decrease Outcome: Progressing   Problem: Nutrition: Goal: Adequate nutrition will be maintained Outcome: Progressing   Problem: Coping: Goal: Level of anxiety will decrease Outcome: Progressing   Problem: Elimination: Goal: Will not experience complications related to bowel motility Outcome: Progressing Goal: Will not experience complications related to urinary retention Outcome: Progressing   Problem: Pain Managment: Goal: General experience of comfort will improve Outcome: Progressing   Problem: Safety: Goal: Ability to remain free from injury will improve Outcome: Progressing   Problem: Skin Integrity: Goal: Risk for impaired skin integrity will decrease Outcome: Progressing

## 2021-08-24 NOTE — Progress Notes (Signed)
The patient is injury-free, afebrile, alert, and oriented X 3. The vital signs were within the baseline during this shift. She complained of mild chronic back pain that improved with the PRN pain regimen. Pt denies chest pain, SOB, nausea, vomiting, dizziness, signs or symptoms of bleeding or infection or acute changes during this shift. We will continue to monitor and work toward achieving the care plan goals

## 2021-08-25 DIAGNOSIS — R627 Adult failure to thrive: Secondary | ICD-10-CM | POA: Diagnosis not present

## 2021-08-25 MED ORDER — ADULT MULTIVITAMIN W/MINERALS CH
1.0000 | ORAL_TABLET | Freq: Every day | ORAL | Status: DC
  Administered 2021-08-25 – 2021-08-28 (×4): 1 via ORAL
  Filled 2021-08-25 (×4): qty 1

## 2021-08-25 MED ORDER — BOOST / RESOURCE BREEZE PO LIQD CUSTOM
1.0000 | Freq: Three times a day (TID) | ORAL | Status: DC
  Administered 2021-08-25 – 2021-08-28 (×6): 1 via ORAL

## 2021-08-25 NOTE — TOC Initial Note (Signed)
Transition of Care [facility]) - Initial/Assessment Note    Patient Details  Name: Elaine Rivera MRN: [id number] Date of Birth: Nov 13, 1945  [facility] CM/SW Contact:    Trish Mage, LCSW Phone Number: 08/25/2021, 2:29 PM  Clinical Narrative:    Patient seen in follow up to PT recommendation of SNF.  Elaine Rivera lives by herself here in [place], has a friend and daughter for supports.  They call or come by daily.  She has been to SNF in the past, knows the drill.  Is receptive to returning for intensive rehab so she can return home safely. Bed search initiated. TOC will continue to follow during the course of hospitalization.                Expected Discharge Plan: Skilled Nursing Facility Barriers to Discharge: SNF Pending bed offer   Patient Goals and CMS Choice     Choice offered to / list presented to : Patient  Expected Discharge Plan and Services Expected Discharge Plan: [place]   Discharge Planning Services: CM Consult Post Acute Care Choice: [place] Living arrangements for the past 2 months: Single Family Home                                      Prior Living Arrangements/Services Living arrangements for the past 2 months: Single Family Home Lives with:: Self Patient language and need for interpreter reviewed:: Yes        Need for Family Participation in Patient Care: Yes (Comment) Care giver support system in place?: Yes (comment) Current home services: DME Criminal Activity/Legal Involvement Pertinent to Current Situation/Hospitalization: No - Comment as needed  Activities of Daily Living Home Assistive Devices/Equipment: Environmental consultant (specify type), Shower chair with back ADL Screening (condition at time of admission) Patient's cognitive ability adequate to safely complete daily activities?: Yes Is the patient deaf or have difficulty hearing?: No Does the patient have difficulty seeing, even when wearing  glasses/contacts?: No Does the patient have difficulty concentrating, remembering, or making decisions?: No Patient able to express need for assistance with ADLs?: Yes Does the patient have difficulty dressing or bathing?: Yes Independently performs ADLs?: No Communication: Independent Dressing (OT): Needs assistance Is this a change from baseline?: Pre-admission baseline Grooming: Independent Feeding: Independent Bathing: Needs assistance Is this a change from baseline?: Pre-admission baseline Toileting: Needs assistance Is this a change from baseline?: Pre-admission baseline In/Out Bed: Needs assistance Is this a change from baseline?: Pre-admission baseline Walks in Home: Dependent Is this a change from baseline?: Pre-admission baseline Does the patient have difficulty walking or climbing stairs?: Yes Weakness of Legs: Both Weakness of Arms/Hands: Both  Permission Sought/Granted                  Emotional Assessment Appearance:: Appears stated age Attitude/Demeanor/Rapport: Engaged Affect (typically observed): Appropriate Orientation: : Oriented to Self, Oriented to Place, Oriented to  Time, Oriented to Situation Alcohol / Substance Use: Not Applicable Psych Involvement: No (comment)  Admission diagnosis:  Hypokalemia [E87.6] Failure to thrive in adult [R62.7] Chronic back pain, unspecified back location, unspecified back pain laterality [M54.9, G89.29] Patient Active Problem List   Diagnosis Date Noted   Failure to thrive in adult 08/23/2021   Right 10th rib fracture 08/23/2021   Physical deconditioning 08/23/2021   Bad headache 03/14/2021   Seizure ([place]) 03/06/2021   CVA (cerebral vascular accident) (  [place]) 03/06/2021   Ischemic cerebrovascular accident (CVA) ([place]) 03/05/2021   Hypokalemia 03/05/2021   Anxiety 03/05/2021   Transient speech disturbance 03/05/2021   Lung nodule 03/05/2021   Aphasia    Fall    Anemia 02/28/2020   Shortness of breath 01/24/2020    Dizziness 01/24/2020   Herpes 10/04/2018   Cerebral aneurysm 09/05/2018   NSAID long-term use 04/20/2016   Scoliosis 04/20/2016   Depression 04/20/2016   HTN (hypertension) 04/20/2016   GERD (gastroesophageal reflux disease) 04/20/2016   Occult GI bleeding 04/20/2016   PCP:  System, Provider Not In Pharmacy:   [facility], [address]  [place] [address] Phone: [phone] Fax: [phone]     Social Determinants of Health (SDOH) Interventions    Readmission Risk Interventions No flowsheet data found.

## 2021-08-25 NOTE — NC FL2 (Signed)
[place] LEVEL OF CARE SCREENING TOOL     IDENTIFICATION  Patient Name: Elaine Rivera Birthdate: Apr 29, 1946 Sex: female Admission Date (Current Location): 08/22/2021  [facility] [place] Number:  Herbalist and Address:  [facility],  [address] [place], [place]      Provider Number: [id number]  Attending Physician Name and Address:  Shawna Clamp, MD  Relative Name and Phone Number:  Geraldyn, Shain (Daughter)   [phone]    Current Level of Care: SNF Recommended Level of Care: [place] Prior Approval Number:    Date Approved/Denied:   PASRR Number: [id number]  Discharge Plan: SNF    Current Diagnoses: Patient Active Problem List   Diagnosis Date Noted   Failure to thrive in adult 08/23/2021   Right 10th rib fracture 08/23/2021   Physical deconditioning 08/23/2021   Bad headache 03/14/2021   Seizure ([place]) 03/06/2021   CVA (cerebral vascular accident) ([place]) 03/06/2021   Ischemic cerebrovascular accident (CVA) ([place]) 03/05/2021   Hypokalemia 03/05/2021   Anxiety 03/05/2021   Transient speech disturbance 03/05/2021   Lung nodule 03/05/2021   Aphasia    Fall    Anemia 02/28/2020   Shortness of breath 01/24/2020   Dizziness 01/24/2020   Herpes 10/04/2018   Cerebral aneurysm 09/05/2018   NSAID long-term use 04/20/2016   Scoliosis 04/20/2016   Depression 04/20/2016   HTN (hypertension) 04/20/2016   GERD (gastroesophageal reflux disease) 04/20/2016   Occult GI bleeding 04/20/2016    Orientation RESPIRATION BLADDER Height & Weight     Self, Time, Situation, Place  Normal Incontinent Weight: 40 kg Height:     BEHAVIORAL SYMPTOMS/MOOD NEUROLOGICAL BOWEL NUTRITION STATUS      Incontinent Diet (see d/c summary)  AMBULATORY STATUS COMMUNICATION OF NEEDS Skin   Extensive Assist Verbally Normal                       Personal Care Assistance Level of Assistance  Bathing, Feeding, Dressing  Bathing Assistance: Maximum assistance Feeding assistance: Independent Dressing Assistance: Limited assistance     Functional Limitations Info  Sight, Hearing, Speech Sight Info: Adequate Hearing Info: Adequate Speech Info: Adequate    SPECIAL CARE FACTORS FREQUENCY  PT (By licensed PT), OT (By licensed OT)     PT Frequency: 5X/W OT Frequency: 5X/W            Contractures Contractures Info: Not present    Additional Factors Info  Code Status, Allergies Code Status Info: full Allergies Info: Sulfa Antibiotics           Current Medications (08/25/2021):  This is the current hospital active medication list Current Facility-Administered Medications  Medication Dose Route Frequency Provider Last Rate Last Admin   acetaminophen (TYLENOL) tablet 650 mg  650 mg Oral Q6H PRN Lavina Hamman, MD       Or   acetaminophen (TYLENOL) suppository 650 mg  650 mg Rectal Q6H PRN Lavina Hamman, MD       ALPRAZolam Duanne Moron) tablet 0.5 mg  0.5 mg Oral BID PRN Lavina Hamman, MD   0.5 mg at 08/23/21 2109   enoxaparin (LOVENOX) injection 40 mg  40 mg Subcutaneous Q24H Lavina Hamman, MD   40 mg at 08/25/21 1111   feeding supplement (BOOST / RESOURCE BREEZE) liquid 1 Container  1 Container Oral TID BM Shawna Clamp, MD   1 Container at 08/25/21 1408   levETIRAcetam (KEPPRA) tablet 500 mg  500  mg Oral BID Lavina Hamman, MD   500 mg at 08/25/21 1110   mirtazapine (REMERON) tablet 7.5 mg  7.5 mg Oral QHS Shawna Clamp, MD   7.5 mg at 08/24/21 2218   multivitamin with minerals tablet 1 tablet  1 tablet Oral Daily Shawna Clamp, MD   1 tablet at 08/25/21 1408   ondansetron (ZOFRAN) tablet 4 mg  4 mg Oral Q6H PRN Lavina Hamman, MD       Or   ondansetron [facility]) injection 4 mg  4 mg Intravenous Q6H PRN Lavina Hamman, MD   4 mg at 08/23/21 0043   senna-docusate (Senokot-S) tablet 1 tablet  1 tablet Oral QHS PRN Lavina Hamman, MD       traMADol Veatrice Bourbon) tablet 50 mg  50 mg Oral Q6H PRN  Lavina Hamman, MD   50 mg at 08/25/21 1119     Discharge Medications: Please see discharge summary for a list of discharge medications.  Relevant Imaging Results:  Relevant Lab Results:   Additional Information SS#: [id number]  [place], [place]

## 2021-08-25 NOTE — Progress Notes (Signed)
Initial Nutrition Assessment  DOCUMENTATION CODES:   Severe malnutrition in context of social or environmental circumstances, Underweight  INTERVENTION:   -Boost Breeze po TID, each supplement provides 250 kcal and 9 grams of protein  -Multivitamin with minerals daily  NUTRITION DIAGNOSIS:   Severe Malnutrition related to social / environmental circumstances (depression) as evidenced by percent weight loss, severe muscle depletion, moderate fat depletion, energy intake < or equal to 50% for > or equal to 1 month.  GOAL:   Patient will meet greater than or equal to 90% of their needs  MONITOR:   Supplement acceptance, Labs, PO intake, Weight trends, I & O's  REASON FOR ASSESSMENT:   Consult Assessment of nutrition requirement/status  ASSESSMENT:   75 years old female with PMH significant for depression, anemia, scoliosis, GERD, hypertension presented in the ED with complaints of generalized fatigue and tiredness. Admitted for generalized weakness, failure to thrive, major depression.  Patient in room, no family present at bedside during visit. Pt reports she is eating better now. Ate broccoli and mac and cheese for lunch. Denies issues with swallowing or chewing. States she has had poor appetite for weeks r/t increased depression and pain in her back. Unable to cook for herself as well. Pt with chronic pain d/t scoliosis. Pt was willing to try Boost Breeze supplements, has had Boost in the past and did not like it. States the regular supplements cause diarrhea.   Per weight records, pt has lost 42 lbs since June 2022 (32% wt loss x 6 months, significant for time frame).   Medications: Remeron  Labs reviewed.  NUTRITION - FOCUSED PHYSICAL EXAM:  Flowsheet Row Most Recent Value  Orbital Region No depletion  Upper Arm Region Severe depletion  Thoracic and Lumbar Region Unable to assess  Buccal Region Moderate depletion  Temple Region Moderate depletion  Clavicle Bone  Region Severe depletion  Clavicle and Acromion Bone Region Severe depletion  Scapular Bone Region Severe depletion  Dorsal Hand Severe depletion  Patellar Region Moderate depletion  Anterior Thigh Region Moderate depletion  Posterior Calf Region Moderate depletion  Edema (RD Assessment) None  Hair Reviewed  Eyes Reviewed  Mouth Reviewed  Skin Reviewed       Diet Order:   Diet Order             Diet regular Room service appropriate? Yes; Fluid consistency: Thin  Diet effective now                   EDUCATION NEEDS:   Education needs have been addressed  Skin:  Skin Assessment: Reviewed RN Assessment  Last BM:  11/26  Height:   Ht Readings from Last 1 Encounters:  03/20/21 _0  (1.499 m)    Weight:   Wt Readings from Last 1 Encounters:  08/25/21 40 kg    BMI:  Body mass index is 17.81 kg/m.  Estimated Nutritional Needs:   Kcal:  1450-1650  Protein:  70-80g  Fluid:  1.7L/day   Clayton Bibles, MS, RD, LDN Inpatient Clinical Dietitian Contact information available via Amion

## 2021-08-25 NOTE — Progress Notes (Signed)
PROGRESS NOTE    Elaine Rivera  KGM:[id number] DOB: 1946-02-13 DOA: 08/22/2021 PCP: System, Provider Not In   Brief Narrative:  This 75 years old female with PMH significant for depression, anemia, scoliosis, GERD, hypertension presented in the ED with complaints of generalized fatigue and tiredness.  Patient was brought in the ED as she was not able to take care of herself.  Patient has chronic pain which has been progressively worse and she is not able to ambulate from her couch.  She was spending most of the time in the same position in the recliner and she was also found covered in the feces and urine.  Patient reports feeling depressed but denies any suicidal/ homicidal ideations.  She also reported having difficulty going to sleep and staying asleep. Patient is admitted for generalized weakness, failure to thrive, major depression.  Psych is consulted.  Recommended to continue current medication.  Patient does not meet inpatient psych hospitalization criteria.  Assessment & Plan:   Principal Problem:   Failure to thrive in adult Active Problems:   Scoliosis   Depression   Cerebral aneurysm   Hypokalemia   Lung nodule   Seizure ([facility])   CVA (cerebral vascular accident) ([place])   Right 10th rib fracture   Physical deconditioning  Failure to thrive: Patient was brought in by daughter, Her clothes were soaked in urine and feces at arrival. Patient lives at home but unable to take care of herself and now unable to ambulate, mostly remains in her recliner.   Patient also had a fall few days back.  Suspect could be secondary to depression. Continue Remeron 7.5 mg daily. Psychiatry consulted,  recommended to continue Remeron. Patient is not appropriate for inpatient psych hospitalization. Dietitian consult.  Hypokalemia: Replaced and improved.  Continue to monitor.  Major depression: > Improving It appears like patient is having significant depressive episode. She denies any  suicidal / Homicidal ideations. Continue Remeron 7.5 mg daily. Psychiatry consulted,  recommended to continue the same. Patient appears more interactive, smiling and happy today. She has improved appetite.  History of CVA: Patient does have history of ischemic CVA,  there is no focal neurological deficits on exam. She does not take any aspirin or statins.  Hx. Of Cerebral aneurysm: Prior history of coiling and embolization.  Physical deconditioning: Patient reported generalized weakness likely due to her chronic pain. X-rays negative for any acute fracture or dislocation. Physical Exam is nonfocal. PT and OT eval recommended skilled nursing facility.  Right 10th rib fracture: Incidental finding.  Patient denies any chest pain.  History of seizure disorders: Patient was not taking Keppra but will resume Keppra. Continue Keppra.  Lung nodule: She is found to have 7 mm right lobe lung nodules.   CT scan is recommended in 3 to 6 months. Outpatient follow up.  Scoliosis Causing chronic pain.  Continue gentle narcotic regimen.  DVT prophylaxis: Lovenox. Code Status: Full code. Family Communication: No family at bed side. Disposition Plan:    Status is: Inpatient  Remains inpatient appropriate because: Failure to thrive, Depression, Severe Deconditioning.  Patient is admitted for failure to thrive, generalized weakness, severely deconditioning.  May require skilled nursing facility at discharge.  Patient is medically clear : Yes   TOC working on finding a SNF.   Consultants:  Psychiatry  Procedures: None  Antimicrobials: None   Subjective: Patient was seen and examined at bedside.  Overnight events noted.   Patient reports feeling much better.  She is more  interactive today,  smiling reports having improved appetite. She denies suicidal or homicidal ideations.  Objective: Vitals:   08/24/21 1300 08/24/21 2132 08/25/21 0701 08/25/21 1026  BP: 138/80 127/80 (!)  152/83   Pulse: 62 76 69   Resp:  16 16   Temp: 97.8 F (36.6 C) (!) 97.5 F (36.4 C) (!) 97.5 F (36.4 C)   TempSrc: Oral Oral Oral   SpO2: 99% 95% 96%   Weight:    40 kg    Intake/Output Summary (Last 24 hours) at 08/25/2021 1113 Last data filed at 08/25/2021 0525 Gross per 24 hour  Intake 1100 ml  Output 1200 ml  Net -100 ml   Filed Weights   08/25/21 1026  Weight: 40 kg    Examination:  General exam: Appears comfortable, chronically ill looking, deconditioned, NAD Respiratory system: CTA  bilaterally, respiratory effort normal, RR 15. Cardiovascular system: S1-S2 heard, regular rate and rhythm, no murmur.   Gastrointestinal system: Abdomen is soft, nontender, nondistended, BS +. Central nervous system: Alert and oriented x 2. No focal neurological deficits. Extremities: No edema, no cyanosis, no clubbing. Skin: No rashes, lesions or ulcers Psychiatry: Judgement and insight appear normal. Mood & affect appropriate.     Data Reviewed: I have personally reviewed following labs and imaging studies  CBC: Recent Labs  Lab 08/22/21 1643 08/23/21 0540 08/24/21 0726  WBC 10.8* 5.3 4.2  NEUTROABS 8.7*  --   --   HGB 15.4* 12.1 12.4  HCT 46.7* 36.8 38.8  MCV 84.9 86.4 88.6  PLT 325 250 397   Basic Metabolic Panel: Recent Labs  Lab 08/22/21 1643 08/22/21 1842 08/23/21 0540 08/24/21 0532  NA 140  --  139 141  K 2.4*  --  3.1* 4.2  CL 99  --  103 106  CO2 32  --  31 31  GLUCOSE 123*  --  85 77  BUN 10  --  7* 7*  CREATININE 0.61  --  0.50 0.50  CALCIUM 9.3  --  8.6* 9.1  MG  --  1.9  --  1.8  PHOS  --   --   --  3.2   GFR: Estimated Creatinine Clearance: 38.4 mL/min (by C-G formula based on SCr of 0.5 mg/dL). Liver Function Tests: Recent Labs  Lab 08/22/21 1643 08/23/21 0540  AST 21 16  ALT 13 10  ALKPHOS 79 62  BILITOT 0.6 0.7  PROT 6.9 5.6*  ALBUMIN 3.5 2.9*   No results for input(s): LIPASE, AMYLASE in the last 168 hours. No results for  input(s): AMMONIA in the last 168 hours. Coagulation Profile: No results for input(s): INR, PROTIME in the last 168 hours. Cardiac Enzymes: Recent Labs  Lab 08/22/21 1643  CKTOTAL 82   BNP (last 3 results) No results for input(s): PROBNP in the last 8760 hours. HbA1C: No results for input(s): HGBA1C in the last 72 hours. CBG: No results for input(s): GLUCAP in the last 168 hours. Lipid Profile: No results for input(s): CHOL, HDL, LDLCALC, TRIG, CHOLHDL, LDLDIRECT in the last 72 hours. Thyroid Function Tests: Recent Labs    08/23/21 0540  TSH 4.422  FREET4 1.03   Anemia Panel: Recent Labs    08/23/21 0540  VITAMINB12 238  FOLATE 8.3   Sepsis Labs: No results for input(s): PROCALCITON, LATICACIDVEN in the last 168 hours.  Recent Results (from the past 240 hour(s))  Resp Panel by RT-PCR (Flu A&B, Covid) Nasopharyngeal Swab     Status: None  Collection Time: 08/22/21  6:42 PM   Specimen: Nasopharyngeal Swab; Nasopharyngeal(NP) swabs in vial transport medium  Result Value Ref Range Status   SARS Coronavirus 2 by RT PCR NEGATIVE NEGATIVE Final    Comment: (NOTE) SARS-CoV-2 target nucleic acids are NOT DETECTED.  The SARS-CoV-2 RNA is generally detectable in upper respiratory specimens during the acute phase of infection. The lowest concentration of SARS-CoV-2 viral copies this assay can detect is 138 copies/mL. A negative result does not preclude SARS-Cov-2 infection and should not be used as the sole basis for treatment or other patient management decisions. A negative result may occur with  improper specimen collection/handling, submission of specimen other than nasopharyngeal swab, presence of viral mutation(s) within the areas targeted by this assay, and inadequate number of viral copies(<138 copies/mL). A negative result must be combined with clinical observations, patient history, and epidemiological information. The expected result is Negative.  Fact Sheet  for Patients:  EntrepreneurPulse.com.au  Fact Sheet for Healthcare Providers:  IncredibleEmployment.be  This test is no t yet approved or cleared by the [place] FDA and  has been authorized for detection and/or diagnosis of SARS-CoV-2 by FDA under an Emergency Use Authorization (EUA). This EUA will remain  in effect (meaning this test can be used) for the duration of the COVID-19 declaration under Section 564(b)(1) of the Act, 21 U.S.C.section 360bbb-3(b)(1), unless the authorization is terminated  or revoked sooner.       Influenza A by PCR NEGATIVE NEGATIVE Final   Influenza B by PCR NEGATIVE NEGATIVE Final    Comment: (NOTE) The Xpert Xpress SARS-CoV-2/FLU/RSV plus assay is intended as an aid in the diagnosis of influenza from Nasopharyngeal swab specimens and should not be used as a sole basis for treatment. Nasal washings and aspirates are unacceptable for Xpert Xpress SARS-CoV-2/FLU/RSV testing.  Fact Sheet for Patients: EntrepreneurPulse.com.au  Fact Sheet for Healthcare Providers: IncredibleEmployment.be  This test is not yet approved or cleared by the [place] FDA and has been authorized for detection and/or diagnosis of SARS-CoV-2 by FDA under an Emergency Use Authorization (EUA). This EUA will remain in effect (meaning this test can be used) for the duration of the COVID-19 declaration under Section 564(b)(1) of the Act, 21 U.S.C. section 360bbb-3(b)(1), unless the authorization is terminated or revoked.  Performed at [facility], [place] [address] [place], [place] [address]     Radiology Studies: No results found.   Scheduled Meds:  enoxaparin (LOVENOX) injection  40 mg Subcutaneous Q24H   levETIRAcetam  500 mg Oral BID   mirtazapine  7.5 mg Oral QHS   Continuous Infusions:  lactated ringers 100 mL/hr at 08/25/21 0525     LOS: 2 days    Time spent:  25 mins.    Shawna Clamp, MD [facility]   If 7PM-7AM, please contact night-coverage

## 2021-08-26 DIAGNOSIS — R627 Adult failure to thrive: Secondary | ICD-10-CM | POA: Diagnosis not present

## 2021-08-26 DIAGNOSIS — E43 Unspecified severe protein-calorie malnutrition: Secondary | ICD-10-CM | POA: Insufficient documentation

## 2021-08-26 LAB — GLUCOSE, CAPILLARY
Glucose-Capillary: 147 mg/dL — ABNORMAL HIGH (ref 70–99)
Glucose-Capillary: 116 mg/dL — ABNORMAL HIGH (ref 70–99)

## 2021-08-26 LAB — RESP PANEL BY RT-PCR (FLU A&B, COVID) ARPGX2
Influenza A by PCR: NEGATIVE
Influenza B by PCR: NEGATIVE
SARS Coronavirus 2 by RT PCR: NEGATIVE

## 2021-08-26 LAB — VITAMIN B1: Vitamin B1 (Thiamine): 89.9 nmol/L (ref 66.5–200.0)

## 2021-08-26 MED ORDER — LEVETIRACETAM 500 MG PO TABS: 500.0000 mg | ORAL_TABLET | Freq: Two times a day (BID) | ORAL | 1 refills | Status: DC

## 2021-08-26 MED ORDER — MIRTAZAPINE 7.5 MG PO TABS: 7.5000 mg | ORAL_TABLET | Freq: Every day | ORAL | 1 refills | Status: DC

## 2021-08-26 NOTE — Care Management Important Message (Signed)
Important Message  Patient Details IM Letter placed in Patients room. Name: Elaine Rivera MRN: [id number] Date of Birth: May 06, 1946   Medicare Important Message Given:  Yes     Kerin Salen 08/26/2021, 10:58 AM

## 2021-08-26 NOTE — Discharge Summary (Signed)
Physician Discharge Summary  Elaine Rivera:[id number] DOB: 05/17/1946 DOA: 08/22/2021  PCP: System, Provider Not In  Admit date: 08/22/2021  Discharge date: 08/26/2021  Admitted From: Home.  Disposition: SNF  Recommendations for Outpatient Follow-up:  Follow up with PCP in 1-2 weeks Please obtain BMP/CBC in one week Advised to take Keppra 500 mg twice daily for history of seizures. Advised to take Remeron 7.5 mg daily for depression  Home Health:None Equipment/Devices:None  Discharge Condition: Stable CODE STATUS:Full code Diet recommendation: Heart Healthy   Brief [facility] course: This 75 years old female with PMH significant for depression, anemia, scoliosis, GERD, hypertension presented in the ED with complaints of generalized fatigue and tiredness. Patient was brought in the ED as she was not able to take care of herself.  Patient has chronic pain which has been progressively worse and she is not able to ambulate from her couch.  She was spending most of the time in the same position in the recliner and she was also found covered in the feces and urine.  Patient reports feeling depressed but denies any suicidal/ homicidal ideations.  She also reported having difficulty going to sleep and staying asleep. Patient was admitted for generalized weakness, failure to thrive, major depression. Psych was consulted.  Stated patient is severely depressed and they agree with continuation of Remeron.  Patient significantly improved with initiation of Remeron.  She has improved appetite, She is more alert,  cooperative and following commands.  Patient denies any suicidal / homicidal ideation.  Psychiatry states patient is not suitable for inpatient psych hospitalization.  Patient seems severely deconditioned requiring physical therapy and rehab.  Patient is being discharged to skilled nursing facility for rehab.   She was managed for below problems.   Discharge Diagnoses:   Principal Problem:   Failure to thrive in adult Active Problems:   Scoliosis   Depression   Cerebral aneurysm   Hypokalemia   Lung nodule   Seizure (HCC)   CVA (cerebral vascular accident) ([place])   Right 10th rib fracture   Physical deconditioning   Protein-calorie malnutrition, severe  Failure to thrive: Patient was brought in by daughter, Her clothes were soaked in urine and feces at arrival. Patient lives at home but unable to take care of herself and now unable to ambulate, mostly remains in her recliner.   Patient also had a fall few days back.  Suspect could be secondary to depression. Continue Remeron 7.5 mg daily. Psychiatry consulted,  recommended to continue Remeron. Patient is not appropriate for inpatient psych hospitalization. Dietitian consult.  Patient's appetite has improved with initiation of Remeron.   We will continue Remeron.   Hypokalemia: Replaced and improved.     Major depression: > Improving It appears like patient is having significant depressive episode. She denies any suicidal / Homicidal ideations. Continue Remeron 7.5 mg daily. Psychiatry consulted,  recommended to continue the same. Patient appears more interactive, smiling and happy today. She has improved appetite.   History of CVA: Patient does have history of ischemic CVA,  there is no focal neurological deficits on exam. She does not take any aspirin or statins.   Hx. Of Cerebral aneurysm: Prior history of coiling and embolization.   Physical deconditioning: Patient reported generalized weakness likely due to her chronic pain. X-rays negative for any acute fracture or dislocation. Physical Exam is nonfocal. PT and OT eval recommended skilled nursing facility.   Right 10th rib fracture: Incidental finding.  Patient denies any chest pain.  History of seizure disorders: Patient was not taking Keppra but will resume Keppra. Continue Keppra.   Lung nodule: She is found to have 7  mm right lobe lung nodules.   CT scan is recommended in 3 to 6 months. Outpatient follow up.   Scoliosis Causing chronic pain.  Continue gentle narcotic regimen.  Discharge Instructions  Discharge Instructions     Call MD for:  difficulty breathing, headache or visual disturbances   Complete by: As directed    Call MD for:  persistant dizziness or light-headedness   Complete by: As directed    Call MD for:  persistant nausea and vomiting   Complete by: As directed    Diet - low sodium heart healthy   Complete by: As directed    Diet Carb Modified   Complete by: As directed    Discharge instructions   Complete by: As directed    Advised to follow-up with primary care physician in 1 week. Advised to take Keppra 500 mg twice daily for history of seizures. Advised to take Remeron 7.5 mg daily for depression.   Increase activity slowly   Complete by: As directed       Allergies as of 08/26/2021       Reactions   Sulfa Antibiotics Other (See Comments)   Reaction:  Unknown         Medication List     STOP taking these medications    ALPRAZolam 0.5 MG tablet Commonly known as: XANAX   atorvastatin 40 MG tablet Commonly known as: LIPITOR   temazepam 15 MG capsule Commonly known as: RESTORIL       TAKE these medications    levETIRAcetam 500 MG tablet Commonly known as: Keppra Take 1 tablet (500 mg total) by mouth 2 (two) times daily.   mirtazapine 7.5 MG tablet Commonly known as: REMERON Take 1 tablet (7.5 mg total) by mouth at bedtime.        Contact information for after-discharge care     Destination     [facility] .   Service: Skilled Nursing Contact information: [place] [address] [phone]                    Allergies  Allergen Reactions   Sulfa Antibiotics Other (See Comments)    Reaction:  Unknown     Consultations: Psychiatry   Procedures/Studies: DG Chest 2  View  Result Date: 08/22/2021 CLINICAL DATA:  Fall. EXAM: CHEST - 2 VIEW COMPARISON:  Chest CT dated 03/06/2021. FINDINGS: No focal consolidation, pleural effusion, pneumothorax. Mild cardiomegaly. Atherosclerotic calcification of the aorta. Osteopenia with scoliosis and degenerative changes of the spine. Faint linear lucency through the posterior right tenth rib concerning for a fracture. Correlation with point tenderness recommended. Evaluation of the osseous structures is very limited due to osteopenia and scoliosis. IMPRESSION: 1. No acute cardiopulmonary process. 2. Findings concerning for a posterior right tenth rib fracture. Electronically Signed   By: Anner Crete M.D.   On: 08/22/2021 20:13   DG Thoracic Spine 2 View  Result Date: 08/22/2021 CLINICAL DATA:  Fall. EXAM: THORACIC SPINE 2 VIEWS; LUMBAR SPINE - COMPLETE 4+ VIEW COMPARISON:  None. FINDINGS: Thoracic spine: There is marked dextroconvex curvature of the mid/lower thoracic spine. There is no definite fracture or dislocation, although scoliosis limits evaluation. There surgical clips in the lower left neck. No other significant bone abnormalities are identified. Lumbar spine: There is marked levoconvex curvature of the  lumbar spine with multilevel degenerative change. No definite acute fracture or dislocation, although scoliosis limits evaluation. There are moderate degenerative changes throughout the lumbar spine. Soft tissues are unremarkable. IMPRESSION: 1. Marked thoracolumbar scoliosis. 2. No acute bony abnormality. 3. Multilevel degenerative changes. Electronically Signed   By: Ronney Asters M.D.   On: 08/22/2021 20:19   DG Lumbar Spine Complete  Result Date: 08/22/2021 CLINICAL DATA:  Fall. EXAM: THORACIC SPINE 2 VIEWS; LUMBAR SPINE - COMPLETE 4+ VIEW COMPARISON:  None. FINDINGS: Thoracic spine: There is marked dextroconvex curvature of the mid/lower thoracic spine. There is no definite fracture or dislocation, although  scoliosis limits evaluation. There surgical clips in the lower left neck. No other significant bone abnormalities are identified. Lumbar spine: There is marked levoconvex curvature of the lumbar spine with multilevel degenerative change. No definite acute fracture or dislocation, although scoliosis limits evaluation. There are moderate degenerative changes throughout the lumbar spine. Soft tissues are unremarkable. IMPRESSION: 1. Marked thoracolumbar scoliosis. 2. No acute bony abnormality. 3. Multilevel degenerative changes. Electronically Signed   By: Ronney Asters M.D.   On: 08/22/2021 20:19   DG Pelvis 1-2 Views  Result Date: 08/22/2021 CLINICAL DATA:  Initial evaluation for acute trauma, fall. EXAM: PELVIS - 1-2 VIEW COMPARISON:  None. FINDINGS: No acute fracture dislocation. Femoral heads in normal alignment within the acetabula. Femoral head height maintained. SI joints approximated. No pubic diastasis. Underlying osteopenia noted. Severe lumbar levoscoliosis with multilevel spondylosis noted. Visualized soft tissues demonstrate no acute finding. IMPRESSION: No acute osseous abnormality about the pelvis. Electronically Signed   By: Jeannine Boga M.D.   On: 08/22/2021 20:13   CT HEAD WO CONTRAST (5MM)  Result Date: 08/22/2021 CLINICAL DATA:  Head trauma, moderate to severe. Increasing falls and difficulty walking. EXAM: CT HEAD WITHOUT CONTRAST CT CERVICAL SPINE WITHOUT CONTRAST TECHNIQUE: Multidetector CT imaging of the head and cervical spine was performed following the standard protocol without intravenous contrast. Multiplanar CT image reconstructions of the cervical spine were also generated. COMPARISON:  CT head 03/04/2021 and 03/13/2021, CTA 03/14/2021 and MRI 03/14/2021. FINDINGS: CT HEAD FINDINGS Brain: Again demonstrated is a hyperdense mass with peripheral calcifications at the right cerebellopontine angle, measuring 2.2 x 1.6 cm on image 6/3, consistent with a partially thrombosed  right PICA aneurysm. This does not appear significantly changed from previous CTs. There is no evidence of acute intracranial hemorrhage, mass lesion, brain edema or extra-axial fluid collection. Stable generalized atrophy with disproportionate dilatation of the lateral and 3rd ventricles, similar to previous study. There is confluent low-density within the periventricular white matter. There is no CT evidence of acute cortical infarction. Vascular: As above, partially thrombosed right PICA aneurysm, grossly stable. There is a vascular stent within the right vertebrobasilar system. Skull: Previous right suboccipital craniotomy. No acute skull findings. Sinuses/Orbits: Mild chronic ethmoid sinus mucosal thickening without air-fluid levels. The additional visualized paranasal sinuses, mastoid air cells and middle ears are clear. No significant orbital findings. Other: None. CT CERVICAL SPINE FINDINGS Alignment: Mild degenerative anterolisthesis at C5-6. Skull base and vertebrae: No evidence of acute cervical spine fracture or traumatic subluxation. Soft tissues and spinal canal: No prevertebral fluid or swelling. No visible canal hematoma. Disc levels: Multilevel spondylosis with disc space narrowing, uncinate spurring and facet hypertrophy. No large disc herniation identified. Upper chest: Scarring at both lung apices. Other: Postsurgical changes at the right skull base and partially thrombosed right PICA aneurysm as noted above. IMPRESSION: 1. No definite acute intracranial findings. 2. Similar appearance of  partially thrombosed right PICA aneurysm with adjacent vascular stent and postsurgical changes. 3. Similar ventriculomegaly which may relate to normal pressure hydrocephalus or central atrophy. 4. No evidence of acute cervical spine fracture, traumatic subluxation or static signs of instability. Multilevel cervical spondylosis. Electronically Signed   By: Richardean Sale M.D.   On: 08/22/2021 19:18   CT  Cervical Spine Wo Contrast  Result Date: 08/22/2021 CLINICAL DATA:  Head trauma, moderate to severe. Increasing falls and difficulty walking. EXAM: CT HEAD WITHOUT CONTRAST CT CERVICAL SPINE WITHOUT CONTRAST TECHNIQUE: Multidetector CT imaging of the head and cervical spine was performed following the standard protocol without intravenous contrast. Multiplanar CT image reconstructions of the cervical spine were also generated. COMPARISON:  CT head 03/04/2021 and 03/13/2021, CTA 03/14/2021 and MRI 03/14/2021. FINDINGS: CT HEAD FINDINGS Brain: Again demonstrated is a hyperdense mass with peripheral calcifications at the right cerebellopontine angle, measuring 2.2 x 1.6 cm on image 6/3, consistent with a partially thrombosed right PICA aneurysm. This does not appear significantly changed from previous CTs. There is no evidence of acute intracranial hemorrhage, mass lesion, brain edema or extra-axial fluid collection. Stable generalized atrophy with disproportionate dilatation of the lateral and 3rd ventricles, similar to previous study. There is confluent low-density within the periventricular white matter. There is no CT evidence of acute cortical infarction. Vascular: As above, partially thrombosed right PICA aneurysm, grossly stable. There is a vascular stent within the right vertebrobasilar system. Skull: Previous right suboccipital craniotomy. No acute skull findings. Sinuses/Orbits: Mild chronic ethmoid sinus mucosal thickening without air-fluid levels. The additional visualized paranasal sinuses, mastoid air cells and middle ears are clear. No significant orbital findings. Other: None. CT CERVICAL SPINE FINDINGS Alignment: Mild degenerative anterolisthesis at C5-6. Skull base and vertebrae: No evidence of acute cervical spine fracture or traumatic subluxation. Soft tissues and spinal canal: No prevertebral fluid or swelling. No visible canal hematoma. Disc levels: Multilevel spondylosis with disc space  narrowing, uncinate spurring and facet hypertrophy. No large disc herniation identified. Upper chest: Scarring at both lung apices. Other: Postsurgical changes at the right skull base and partially thrombosed right PICA aneurysm as noted above. IMPRESSION: 1. No definite acute intracranial findings. 2. Similar appearance of partially thrombosed right PICA aneurysm with adjacent vascular stent and postsurgical changes. 3. Similar ventriculomegaly which may relate to normal pressure hydrocephalus or central atrophy. 4. No evidence of acute cervical spine fracture, traumatic subluxation or static signs of instability. Multilevel cervical spondylosis. Electronically Signed   By: Richardean Sale M.D.   On: 08/22/2021 19:18      Subjective: Patient was seen and examined at bedside.  Overnight events noted.  Patient reports feeling much improved.   Patient has improved appetite, She is more cooperative,  coherent , communicative and interactive.   Patient is being discharged to skilled nursing facility for rehab  Discharge Exam: Vitals:   08/26/21 0354 08/26/21 0938  BP: 137/85 130/76  Pulse: 73 86  Resp: 16 16  Temp: (!) 97.4 F (36.3 C) 98.1 F (36.7 C)  SpO2: 94% 95%   Vitals:   08/25/21 1421 08/25/21 2000 08/26/21 0354 08/26/21 0938  BP: 125/73 102/67 137/85 130/76  Pulse: 75 78 73 86  Resp: 19 18 16 16   Temp: 98.2 F (36.8 C) (!) 97.5 F (36.4 C) (!) 97.4 F (36.3 C) 98.1 F (36.7 C)  TempSrc:  Oral Oral Oral  SpO2: 97% 95% 94% 95%  Weight:        General: Pt is alert, awake,  not in acute distress Cardiovascular: RRR, S1/S2 +, no rubs, no gallops Respiratory: CTA bilaterally, no wheezing, no rhonchi Abdominal: Soft, NT, ND, bowel sounds + Extremities: no edema, no cyanosis    The results of significant diagnostics from this hospitalization (including imaging, microbiology, ancillary and laboratory) are listed below for reference.     Microbiology: Recent Results (from the  past 240 hour(s))  Resp Panel by RT-PCR (Flu A&B, Covid) Nasopharyngeal Swab     Status: None   Collection Time: 08/22/21  6:42 PM   Specimen: Nasopharyngeal Swab; Nasopharyngeal(NP) swabs in vial transport medium  Result Value Ref Range Status   SARS Coronavirus 2 by RT PCR NEGATIVE NEGATIVE Final    Comment: (NOTE) SARS-CoV-2 target nucleic acids are NOT DETECTED.  The SARS-CoV-2 RNA is generally detectable in upper respiratory specimens during the acute phase of infection. The lowest concentration of SARS-CoV-2 viral copies this assay can detect is 138 copies/mL. A negative result does not preclude SARS-Cov-2 infection and should not be used as the sole basis for treatment or other patient management decisions. A negative result may occur with  improper specimen collection/handling, submission of specimen other than nasopharyngeal swab, presence of viral mutation(s) within the areas targeted by this assay, and inadequate number of viral copies(<138 copies/mL). A negative result must be combined with clinical observations, patient history, and epidemiological information. The expected result is Negative.  Fact Sheet for Patients:  EntrepreneurPulse.com.au  Fact Sheet for Healthcare Providers:  IncredibleEmployment.be  This test is no t yet approved or cleared by the [place] FDA and  has been authorized for detection and/or diagnosis of SARS-CoV-2 by FDA under an Emergency Use Authorization (EUA). This EUA will remain  in effect (meaning this test can be used) for the duration of the COVID-19 declaration under Section 564(b)(1) of the Act, 21 U.S.C.section 360bbb-3(b)(1), unless the authorization is terminated  or revoked sooner.       Influenza A by PCR NEGATIVE NEGATIVE Final   Influenza B by PCR NEGATIVE NEGATIVE Final    Comment: (NOTE) The Xpert Xpress SARS-CoV-2/FLU/RSV plus assay is intended as an aid in the diagnosis of  influenza from Nasopharyngeal swab specimens and should not be used as a sole basis for treatment. Nasal washings and aspirates are unacceptable for Xpert Xpress SARS-CoV-2/FLU/RSV testing.  Fact Sheet for Patients: EntrepreneurPulse.com.au  Fact Sheet for Healthcare Providers: IncredibleEmployment.be  This test is not yet approved or cleared by the [place] FDA and has been authorized for detection and/or diagnosis of SARS-CoV-2 by FDA under an Emergency Use Authorization (EUA). This EUA will remain in effect (meaning this test can be used) for the duration of the COVID-19 declaration under Section 564(b)(1) of the Act, 21 U.S.C. section 360bbb-3(b)(1), unless the authorization is terminated or revoked.  Performed at [facility], [place] [address] [place], [place] [address]      Labs: BNP (last 3 results) Recent Labs    03/13/21 2314  BNP 64.4   Basic Metabolic Panel: Recent Labs  Lab 08/22/21 1643 08/22/21 1842 08/23/21 0540 08/24/21 0532  NA 140  --  139 141  K 2.4*  --  3.1* 4.2  CL 99  --  103 106  CO2 32  --  31 31  GLUCOSE 123*  --  85 77  BUN 10  --  7* 7*  CREATININE 0.61  --  0.50 0.50  CALCIUM 9.3  --  8.6* 9.1  MG  --  1.9  --  1.8  PHOS  --   --   --  3.2   Liver Function Tests: Recent Labs  Lab 08/22/21 1643 08/23/21 0540  AST 21 16  ALT 13 10  ALKPHOS 79 62  BILITOT 0.6 0.7  PROT 6.9 5.6*  ALBUMIN 3.5 2.9*   No results for input(s): LIPASE, AMYLASE in the last 168 hours. No results for input(s): AMMONIA in the last 168 hours. CBC: Recent Labs  Lab 08/22/21 1643 08/23/21 0540 08/24/21 0726  WBC 10.8* 5.3 4.2  NEUTROABS 8.7*  --   --   HGB 15.4* 12.1 12.4  HCT 46.7* 36.8 38.8  MCV 84.9 86.4 88.6  PLT 325 250 247   Cardiac Enzymes: Recent Labs  Lab 08/22/21 1643  CKTOTAL 82   BNP: Invalid input(s): POCBNP CBG: No results for input(s): GLUCAP in the last 168  hours. D-Dimer No results for input(s): DDIMER in the last 72 hours. Hgb A1c No results for input(s): HGBA1C in the last 72 hours. Lipid Profile No results for input(s): CHOL, HDL, LDLCALC, TRIG, CHOLHDL, LDLDIRECT in the last 72 hours. Thyroid function studies No results for input(s): TSH, T4TOTAL, T3FREE, THYROIDAB in the last 72 hours.  Invalid input(s): FREET3 Anemia work up No results for input(s): VITAMINB12, FOLATE, FERRITIN, TIBC, IRON, RETICCTPCT in the last 72 hours. Urinalysis    Component Value Date/Time   COLORURINE YELLOW 08/22/2021 2141   APPEARANCEUR HAZY (A) 08/22/2021 2141   LABSPEC 1.012 08/22/2021 2141   PHURINE 6.0 08/22/2021 2141   GLUCOSEU NEGATIVE 08/22/2021 2141   HGBUR SMALL (A) 08/22/2021 2141   BILIRUBINUR NEGATIVE 08/22/2021 2141   KETONESUR NEGATIVE 08/22/2021 2141   PROTEINUR NEGATIVE 08/22/2021 2141   UROBILINOGEN 0.2 12/26/2019 1226   NITRITE NEGATIVE 08/22/2021 2141   LEUKOCYTESUR TRACE (A) 08/22/2021 2141   Sepsis Labs Invalid input(s): PROCALCITONIN,  WBC,  LACTICIDVEN Microbiology Recent Results (from the past 240 hour(s))  Resp Panel by RT-PCR (Flu A&B, Covid) Nasopharyngeal Swab     Status: None   Collection Time: 08/22/21  6:42 PM   Specimen: Nasopharyngeal Swab; Nasopharyngeal(NP) swabs in vial transport medium  Result Value Ref Range Status   SARS Coronavirus 2 by RT PCR NEGATIVE NEGATIVE Final    Comment: (NOTE) SARS-CoV-2 target nucleic acids are NOT DETECTED.  The SARS-CoV-2 RNA is generally detectable in upper respiratory specimens during the acute phase of infection. The lowest concentration of SARS-CoV-2 viral copies this assay can detect is 138 copies/mL. A negative result does not preclude SARS-Cov-2 infection and should not be used as the sole basis for treatment or other patient management decisions. A negative result may occur with  improper specimen collection/handling, submission of specimen other than  nasopharyngeal swab, presence of viral mutation(s) within the areas targeted by this assay, and inadequate number of viral copies(<138 copies/mL). A negative result must be combined with clinical observations, patient history, and epidemiological information. The expected result is Negative.  Fact Sheet for Patients:  EntrepreneurPulse.com.au  Fact Sheet for Healthcare Providers:  IncredibleEmployment.be  This test is no t yet approved or cleared by the [place] FDA and  has been authorized for detection and/or diagnosis of SARS-CoV-2 by FDA under an Emergency Use Authorization (EUA). This EUA will remain  in effect (meaning this test can be used) for the duration of the COVID-19 declaration under Section 564(b)(1) of the Act, 21 U.S.C.section 360bbb-3(b)(1), unless the authorization is terminated  or revoked sooner.       Influenza A by PCR NEGATIVE  NEGATIVE Final   Influenza B by PCR NEGATIVE NEGATIVE Final    Comment: (NOTE) The Xpert Xpress SARS-CoV-2/FLU/RSV plus assay is intended as an aid in the diagnosis of influenza from Nasopharyngeal swab specimens and should not be used as a sole basis for treatment. Nasal washings and aspirates are unacceptable for Xpert Xpress SARS-CoV-2/FLU/RSV testing.  Fact Sheet for Patients: EntrepreneurPulse.com.au  Fact Sheet for Healthcare Providers: IncredibleEmployment.be  This test is not yet approved or cleared by the [place] FDA and has been authorized for detection and/or diagnosis of SARS-CoV-2 by FDA under an Emergency Use Authorization (EUA). This EUA will remain in effect (meaning this test can be used) for the duration of the COVID-19 declaration under Section 564(b)(1) of the Act, 21 U.S.C. section 360bbb-3(b)(1), unless the authorization is terminated or revoked.  Performed at [facility], [place] [address], [place], [place] [address]      Time coordinating discharge: Over 30 minutes  SIGNED:   Shawna Clamp, MD  [facility] 08/26/2021, 11:18 AM Pager   If 7PM-7AM, please contact night-coverage

## 2021-08-26 NOTE — Progress Notes (Signed)
Physical Therapy Treatment Patient Details Name: Elaine Rivera MRN: [id number] DOB: Dec 03, 1945 Today's Date: 08/26/2021   History of Present Illness Pt is 75 yo admit on 08/22/21 with complains of increased weakness and fatigue and unable to care for herself or even get off the couch. Arrived very soiled in feces and urine. Admitted FTT and depression. PMH: for CVA and scoliosis    PT Comments    Pt making gradual progress but still very limited by fatigue and dizziness.  Pt had c/o dizziness with rolling, transfers and head turns.  Dizziness was worst in standing.  Orthostatic BP was negative.  Pt did have more dizziness with rolling right but difficulty following commands for correct testing position - possible BPPV on right posterior canal. Cont to progress as able.      Recommendations for follow up therapy are one component of a multi-disciplinary discharge planning process, led by the attending physician.  Recommendations may be updated based on patient status, additional functional criteria and insurance authorization.  Follow Up Recommendations  Skilled nursing-short term rehab (<3 hours/day)     Assistance Recommended at Discharge Frequent or constant Supervision/Assistance  Equipment Recommendations  Rolling walker (2 wheels);BSC/3in1    Recommendations for Other Services       Precautions / Restrictions Precautions Precautions: Fall     Mobility  Bed Mobility Overal bed mobility: Needs Assistance Bed Mobility: Supine to Sit;Sit to Supine     Supine to sit: Min assist Sit to supine: Min assist   General bed mobility comments: Increased time with c/o dizziness    Transfers Overall transfer level: Needs assistance Equipment used: Rolling walker (2 wheels) Transfers: Sit to/from Stand;Bed to chair/wheelchair/BSC Sit to Stand: Mod assist   Squat pivot transfers: Min assist Step pivot transfers: Mod assist     General transfer comment: Requiring cues for  hand placement and light mod A to stand.  Pt performed squat pivot to chair with min A to steady and step pivot back to bed wtih RW and mod A to steady and for cues.  Fatigued very easily and with dizzienss.    Ambulation/Gait                   Stairs             Wheelchair Mobility    Modified Rankin (Stroke Patients Only)       Balance Overall balance assessment: Needs assistance Sitting-balance support: Bilateral upper extremity supported;Feet supported Sitting balance-Leahy Scale: Poor Sitting balance - Comments: Requiring UE support and min-mod A Postural control: Posterior lean Standing balance support: Bilateral upper extremity supported;Reliant on assistive device for balance;During functional activity Standing balance-Leahy Scale: Poor Standing balance comment: Requiring RW and min-mod A; only tolerating standing for < 30 seconds                            Cognition Arousal/Alertness: Awake/alert Behavior During Therapy: Flat affect Overall Cognitive Status: No family/caregiver present to determine baseline cognitive functioning                                 General Comments: Following simple commands with increased time but has difficulty with complex commands        Exercises      General Comments   Pt had c/o dizziness.  She is unable to provide history of dizziness, states feels  like she will pass out.  Dizziness increased with transfer to sitting and then to standing.  Bps were stable at 140's/60's all positions. Pt had difficulty with EOEM and unable to maintain smooth pursuit vs difficulty with commands but no nystagmus noted.  Gaze stabilization intact and test of askew negative.  Attempted testing [facility] but even with bed tilted pt had difficutly getting in correct positions.  She was more asymptomatic with head to right but no nystagmus noted.   May benefit from further vestibular testing/treatment.  Did  educate on Epley's maneuver and habituation exercises.  Had pt perform tracking x 5, head turns x 5, and gaze stabilization x 5.        Pertinent Vitals/Pain Pain Assessment: Faces Faces Pain Scale: Hurts little more Pain Location: Chronic back from scoliosis. Pain Descriptors / Indicators: Grimacing Pain Intervention(s): Limited activity within patient's tolerance;Monitored during session    Home Living                          Prior Function            PT Goals (current goals can now be found in the care plan section) Progress towards PT goals: Progressing toward goals    Frequency    Min 3X/week      PT Plan Current plan remains appropriate    Co-evaluation              AM-PAC PT "6 Clicks" Mobility   Outcome Measure  Help needed turning from your back to your side while in a flat bed without using bedrails?: A Little Help needed moving from lying on your back to sitting on the side of a flat bed without using bedrails?: A Little Help needed moving to and from a bed to a chair (including a wheelchair)?: A Lot Help needed standing up from a chair using your arms (e.g., wheelchair or bedside chair)?: A Lot Help needed to walk in hospital room?: A Lot Help needed climbing 3-5 steps with a railing? : Total 6 Click Score: 13    End of Session Equipment Utilized During Treatment: Gait belt Activity Tolerance: Patient limited by fatigue Patient left: in bed;with call bell/phone within reach;with bed alarm set Nurse Communication: Mobility status PT Visit Diagnosis: Other abnormalities of gait and mobility (R26.89);Muscle weakness (generalized) (M62.81)     Time: 4401-0272 PT Time Calculation (min) (ACUTE ONLY): 20 min  Charges:  $Therapeutic Activity: 8-22 mins                     Abran Richard, PT Acute Rehab Services Pager [phone] [facility] [place] 08/26/2021, 11:33 AM

## 2021-08-26 NOTE — Discharge Instructions (Signed)
Advised to follow-up with primary care physician in 1 week. Advised to take Keppra 500 mg twice daily for history of seizures. Advised to take Remeron 7.5 mg daily for depression.

## 2021-08-27 NOTE — TOC Progression Note (Signed)
Transition of Care [facility]) - Progression Note    Patient Details  Name: Elaine Rivera MRN: [id number] Date of Birth: 05/04/1946  Transition of Care [facility]) CM/SW [place], [place] Phone Number: 08/27/2021, 3:55 PM  Clinical Narrative:   Received insurance auth for both rehab and ambulance transport. Reference Numbers [id number] and [id number]. PTAR currently running at least 6 hours behind.  Facility requested we send in the AM. TOC will continue to follow during the course of hospitalization.     Expected Discharge Plan: [place] Barriers to Discharge: SNF Pending bed offer  Expected Discharge Plan and Services Expected Discharge Plan: [place]   Discharge Planning Services: CM Consult Post Acute Care Choice: [place] Living arrangements for the past 2 months: Single Family Home Expected Discharge Date: 08/26/21                                     Social Determinants of Health (SDOH) Interventions    Readmission Risk Interventions No flowsheet data found.

## 2021-08-27 NOTE — Discharge Summary (Signed)
Physician Discharge Summary  MARKELLA DAO KXF:[id number] DOB: 05-13-1946 DOA: 08/22/2021  PCP: System, Provider Not In  Admit date: 08/22/2021  Discharge date: 08/28/2021  Admitted From: Home.  Disposition: SNF  Recommendations for Outpatient Follow-up:  Follow up with PCP in 1-2 weeks Please obtain BMP/CBC in one week Advised to take Keppra 500 mg twice daily for history of seizures. Advised to take Remeron 7.5 mg daily for depression  Home Health:None Equipment/Devices:None  Discharge Condition: Stable CODE STATUS:Full code Diet recommendation: Heart Healthy   [facility] course: This 75 years old female with PMH significant for depression, anemia, scoliosis, GERD, hypertension presented in the ED with complaints of generalized fatigue and tiredness. Patient was brought in the ED as she was not able to take care of herself.  Patient has chronic pain which has been progressively worse and she is not able to ambulate from her couch.  She was spending most of the time in the same position in the recliner and she was also found covered in the feces and urine.  Patient reports feeling depressed but denies any suicidal/ homicidal ideations.  She also reported having difficulty going to sleep and staying asleep. Patient was admitted for generalized weakness, failure to thrive, major depression. Psych was consulted.  Stated patient is severely depressed and they agree with continuation of Remeron.  Patient significantly improved with initiation of Remeron.  She has improved appetite, She is more alert,  cooperative and following commands.  Patient denies any suicidal / homicidal ideation.  Psychiatry states patient is not suitable for inpatient psych hospitalization.  Patient seems severely deconditioned requiring physical therapy and rehab.  Patient is being discharged to skilled nursing facility for rehab.   She was managed for below problems.   Discharge Diagnoses:   Principal Problem:   Failure to thrive in adult Active Problems:   Scoliosis   Depression   Cerebral aneurysm   Hypokalemia   Lung nodule   Seizure (HCC)   CVA (cerebral vascular accident) ([place])   Right 10th rib fracture   Physical deconditioning   Protein-calorie malnutrition, severe  Failure to thrive: Patient was brought in by daughter, Her clothes were soaked in urine and feces at arrival. Patient lives at home but unable to take care of herself and now unable to ambulate, mostly remains in her recliner.   Patient also had a fall few days back.  Suspect could be secondary to depression. Continue Remeron 7.5 mg daily. Psychiatry consulted,  recommended to continue Remeron. Patient is not appropriate for inpatient psych hospitalization. Dietitian consult.  Patient's appetite has improved with initiation of Remeron.   We will continue Remeron.   Hypokalemia: Replaced and improved.     Major depression: > Improving It appears like patient is having significant depressive episode. She denies any suicidal / Homicidal ideations. Continue Remeron 7.5 mg daily. Psychiatry consulted,  recommended to continue the same. Patient appears more interactive, smiling and happy today. She has improved appetite.   History of CVA: Patient does have history of ischemic CVA,  there is no focal neurological deficits on exam. She does not take any aspirin or statins.   Hx. Of Cerebral aneurysm: Prior history of coiling and embolization.   Physical deconditioning: Patient reported generalized weakness likely due to her chronic pain. X-rays negative for any acute fracture or dislocation. Physical Exam is nonfocal. PT and OT eval recommended skilled nursing facility.   Right 10th rib fracture: Incidental finding.  Patient denies any chest pain.  History of seizure disorders: Patient was not taking Keppra but will resume Keppra. Continue Keppra.   Lung nodule: She is found to have 7  mm right lobe lung nodules.   CT scan is recommended in 3 to 6 months. Outpatient follow up.   Scoliosis Causing chronic pain.  Continue gentle narcotic regimen.  Discharge Instructions  Discharge Instructions     Call MD for:  difficulty breathing, headache or visual disturbances   Complete by: As directed    Call MD for:  persistant dizziness or light-headedness   Complete by: As directed    Call MD for:  persistant nausea and vomiting   Complete by: As directed    Diet - low sodium heart healthy   Complete by: As directed    Diet Carb Modified   Complete by: As directed    Discharge instructions   Complete by: As directed    Advised to follow-up with primary care physician in 1 week. Advised to take Keppra 500 mg twice daily for history of seizures. Advised to take Remeron 7.5 mg daily for depression.   Increase activity slowly   Complete by: As directed       Allergies as of 08/27/2021       Reactions   Sulfa Antibiotics Other (See Comments)   Reaction:  Unknown         Medication List     STOP taking these medications    ALPRAZolam 0.5 MG tablet Commonly known as: XANAX   atorvastatin 40 MG tablet Commonly known as: LIPITOR   temazepam 15 MG capsule Commonly known as: RESTORIL       TAKE these medications    levETIRAcetam 500 MG tablet Commonly known as: Keppra Take 1 tablet (500 mg total) by mouth 2 (two) times daily.   mirtazapine 7.5 MG tablet Commonly known as: REMERON Take 1 tablet (7.5 mg total) by mouth at bedtime.        Contact information for after-discharge care     Destination     [facility] .   Service: Skilled Nursing Contact information: [place] [address] [phone]                    Allergies  Allergen Reactions   Sulfa Antibiotics Other (See Comments)    Reaction:  Unknown     Consultations: Psychiatry   Procedures/Studies: DG Chest 2  View  Result Date: 08/22/2021 CLINICAL DATA:  Fall. EXAM: CHEST - 2 VIEW COMPARISON:  Chest CT dated 03/06/2021. FINDINGS: No focal consolidation, pleural effusion, pneumothorax. Mild cardiomegaly. Atherosclerotic calcification of the aorta. Osteopenia with scoliosis and degenerative changes of the spine. Faint linear lucency through the posterior right tenth rib concerning for a fracture. Correlation with point tenderness recommended. Evaluation of the osseous structures is very limited due to osteopenia and scoliosis. IMPRESSION: 1. No acute cardiopulmonary process. 2. Findings concerning for a posterior right tenth rib fracture. Electronically Signed   By: Anner Crete M.D.   On: 08/22/2021 20:13   DG Thoracic Spine 2 View  Result Date: 08/22/2021 CLINICAL DATA:  Fall. EXAM: THORACIC SPINE 2 VIEWS; LUMBAR SPINE - COMPLETE 4+ VIEW COMPARISON:  None. FINDINGS: Thoracic spine: There is marked dextroconvex curvature of the mid/lower thoracic spine. There is no definite fracture or dislocation, although scoliosis limits evaluation. There surgical clips in the lower left neck. No other significant bone abnormalities are identified. Lumbar spine: There is marked levoconvex curvature of the  lumbar spine with multilevel degenerative change. No definite acute fracture or dislocation, although scoliosis limits evaluation. There are moderate degenerative changes throughout the lumbar spine. Soft tissues are unremarkable. IMPRESSION: 1. Marked thoracolumbar scoliosis. 2. No acute bony abnormality. 3. Multilevel degenerative changes. Electronically Signed   By: Ronney Asters M.D.   On: 08/22/2021 20:19   DG Lumbar Spine Complete  Result Date: 08/22/2021 CLINICAL DATA:  Fall. EXAM: THORACIC SPINE 2 VIEWS; LUMBAR SPINE - COMPLETE 4+ VIEW COMPARISON:  None. FINDINGS: Thoracic spine: There is marked dextroconvex curvature of the mid/lower thoracic spine. There is no definite fracture or dislocation, although  scoliosis limits evaluation. There surgical clips in the lower left neck. No other significant bone abnormalities are identified. Lumbar spine: There is marked levoconvex curvature of the lumbar spine with multilevel degenerative change. No definite acute fracture or dislocation, although scoliosis limits evaluation. There are moderate degenerative changes throughout the lumbar spine. Soft tissues are unremarkable. IMPRESSION: 1. Marked thoracolumbar scoliosis. 2. No acute bony abnormality. 3. Multilevel degenerative changes. Electronically Signed   By: Ronney Asters M.D.   On: 08/22/2021 20:19   DG Pelvis 1-2 Views  Result Date: 08/22/2021 CLINICAL DATA:  Initial evaluation for acute trauma, fall. EXAM: PELVIS - 1-2 VIEW COMPARISON:  None. FINDINGS: No acute fracture dislocation. Femoral heads in normal alignment within the acetabula. Femoral head height maintained. SI joints approximated. No pubic diastasis. Underlying osteopenia noted. Severe lumbar levoscoliosis with multilevel spondylosis noted. Visualized soft tissues demonstrate no acute finding. IMPRESSION: No acute osseous abnormality about the pelvis. Electronically Signed   By: Jeannine Boga M.D.   On: 08/22/2021 20:13   CT HEAD WO CONTRAST (5MM)  Result Date: 08/22/2021 CLINICAL DATA:  Head trauma, moderate to severe. Increasing falls and difficulty walking. EXAM: CT HEAD WITHOUT CONTRAST CT CERVICAL SPINE WITHOUT CONTRAST TECHNIQUE: Multidetector CT imaging of the head and cervical spine was performed following the standard protocol without intravenous contrast. Multiplanar CT image reconstructions of the cervical spine were also generated. COMPARISON:  CT head 03/04/2021 and 03/13/2021, CTA 03/14/2021 and MRI 03/14/2021. FINDINGS: CT HEAD FINDINGS Brain: Again demonstrated is a hyperdense mass with peripheral calcifications at the right cerebellopontine angle, measuring 2.2 x 1.6 cm on image 6/3, consistent with a partially thrombosed  right PICA aneurysm. This does not appear significantly changed from previous CTs. There is no evidence of acute intracranial hemorrhage, mass lesion, brain edema or extra-axial fluid collection. Stable generalized atrophy with disproportionate dilatation of the lateral and 3rd ventricles, similar to previous study. There is confluent low-density within the periventricular white matter. There is no CT evidence of acute cortical infarction. Vascular: As above, partially thrombosed right PICA aneurysm, grossly stable. There is a vascular stent within the right vertebrobasilar system. Skull: Previous right suboccipital craniotomy. No acute skull findings. Sinuses/Orbits: Mild chronic ethmoid sinus mucosal thickening without air-fluid levels. The additional visualized paranasal sinuses, mastoid air cells and middle ears are clear. No significant orbital findings. Other: None. CT CERVICAL SPINE FINDINGS Alignment: Mild degenerative anterolisthesis at C5-6. Skull base and vertebrae: No evidence of acute cervical spine fracture or traumatic subluxation. Soft tissues and spinal canal: No prevertebral fluid or swelling. No visible canal hematoma. Disc levels: Multilevel spondylosis with disc space narrowing, uncinate spurring and facet hypertrophy. No large disc herniation identified. Upper chest: Scarring at both lung apices. Other: Postsurgical changes at the right skull base and partially thrombosed right PICA aneurysm as noted above. IMPRESSION: 1. No definite acute intracranial findings. 2. Similar appearance of  partially thrombosed right PICA aneurysm with adjacent vascular stent and postsurgical changes. 3. Similar ventriculomegaly which may relate to normal pressure hydrocephalus or central atrophy. 4. No evidence of acute cervical spine fracture, traumatic subluxation or static signs of instability. Multilevel cervical spondylosis. Electronically Signed   By: Richardean Sale M.D.   On: 08/22/2021 19:18   CT  Cervical Spine Wo Contrast  Result Date: 08/22/2021 CLINICAL DATA:  Head trauma, moderate to severe. Increasing falls and difficulty walking. EXAM: CT HEAD WITHOUT CONTRAST CT CERVICAL SPINE WITHOUT CONTRAST TECHNIQUE: Multidetector CT imaging of the head and cervical spine was performed following the standard protocol without intravenous contrast. Multiplanar CT image reconstructions of the cervical spine were also generated. COMPARISON:  CT head 03/04/2021 and 03/13/2021, CTA 03/14/2021 and MRI 03/14/2021. FINDINGS: CT HEAD FINDINGS Brain: Again demonstrated is a hyperdense mass with peripheral calcifications at the right cerebellopontine angle, measuring 2.2 x 1.6 cm on image 6/3, consistent with a partially thrombosed right PICA aneurysm. This does not appear significantly changed from previous CTs. There is no evidence of acute intracranial hemorrhage, mass lesion, brain edema or extra-axial fluid collection. Stable generalized atrophy with disproportionate dilatation of the lateral and 3rd ventricles, similar to previous study. There is confluent low-density within the periventricular white matter. There is no CT evidence of acute cortical infarction. Vascular: As above, partially thrombosed right PICA aneurysm, grossly stable. There is a vascular stent within the right vertebrobasilar system. Skull: Previous right suboccipital craniotomy. No acute skull findings. Sinuses/Orbits: Mild chronic ethmoid sinus mucosal thickening without air-fluid levels. The additional visualized paranasal sinuses, mastoid air cells and middle ears are clear. No significant orbital findings. Other: None. CT CERVICAL SPINE FINDINGS Alignment: Mild degenerative anterolisthesis at C5-6. Skull base and vertebrae: No evidence of acute cervical spine fracture or traumatic subluxation. Soft tissues and spinal canal: No prevertebral fluid or swelling. No visible canal hematoma. Disc levels: Multilevel spondylosis with disc space  narrowing, uncinate spurring and facet hypertrophy. No large disc herniation identified. Upper chest: Scarring at both lung apices. Other: Postsurgical changes at the right skull base and partially thrombosed right PICA aneurysm as noted above. IMPRESSION: 1. No definite acute intracranial findings. 2. Similar appearance of partially thrombosed right PICA aneurysm with adjacent vascular stent and postsurgical changes. 3. Similar ventriculomegaly which may relate to normal pressure hydrocephalus or central atrophy. 4. No evidence of acute cervical spine fracture, traumatic subluxation or static signs of instability. Multilevel cervical spondylosis. Electronically Signed   By: Richardean Sale M.D.   On: 08/22/2021 19:18      Subjective: Patient was seen and examined at bedside.  Overnight events noted.  Patient reports feeling much improved.   Patient has improved appetite, She is more cooperative,  coherent , communicative and interactive.   Patient is being discharged to skilled nursing facility for rehab  Discharge Exam: Vitals:   08/27/21 0327 08/27/21 1336  BP: 135/82 112/75  Pulse: 72 89  Resp: 18 16  Temp: 97.9 F (36.6 C) 97.8 F (36.6 C)  SpO2: 98% 97%   Vitals:   08/26/21 1350 08/26/21 2025 08/27/21 0327 08/27/21 1336  BP: 122/80 102/72 135/82 112/75  Pulse: 87 91 72 89  Resp: 16 20 18 16   Temp: 98 F (36.7 C) 98.7 F (37.1 C) 97.9 F (36.6 C) 97.8 F (36.6 C)  TempSrc: Oral Oral Oral Oral  SpO2: 95% 95% 98% 97%  Weight:      Height:        General: Pt  is alert, awake, not in acute distress Cardiovascular: RRR, S1/S2 +, no rubs, no gallops Respiratory: CTA bilaterally, no wheezing, no rhonchi Abdominal: Soft, NT, ND, bowel sounds + Extremities: no edema, no cyanosis    The results of significant diagnostics from this hospitalization (including imaging, microbiology, ancillary and laboratory) are listed below for reference.     Microbiology: Recent Results (from  the past 240 hour(s))  Resp Panel by RT-PCR (Flu A&B, Covid) Nasopharyngeal Swab     Status: None   Collection Time: 08/22/21  6:42 PM   Specimen: Nasopharyngeal Swab; Nasopharyngeal(NP) swabs in vial transport medium  Result Value Ref Range Status   SARS Coronavirus 2 by RT PCR NEGATIVE NEGATIVE Final    Comment: (NOTE) SARS-CoV-2 target nucleic acids are NOT DETECTED.  The SARS-CoV-2 RNA is generally detectable in upper respiratory specimens during the acute phase of infection. The lowest concentration of SARS-CoV-2 viral copies this assay can detect is 138 copies/mL. A negative result does not preclude SARS-Cov-2 infection and should not be used as the sole basis for treatment or other patient management decisions. A negative result may occur with  improper specimen collection/handling, submission of specimen other than nasopharyngeal swab, presence of viral mutation(s) within the areas targeted by this assay, and inadequate number of viral copies(<138 copies/mL). A negative result must be combined with clinical observations, patient history, and epidemiological information. The expected result is Negative.  Fact Sheet for Patients:  EntrepreneurPulse.com.au  Fact Sheet for Healthcare Providers:  IncredibleEmployment.be  This test is no t yet approved or cleared by the [place] FDA and  has been authorized for detection and/or diagnosis of SARS-CoV-2 by FDA under an Emergency Use Authorization (EUA). This EUA will remain  in effect (meaning this test can be used) for the duration of the COVID-19 declaration under Section 564(b)(1) of the Act, 21 U.S.C.section 360bbb-3(b)(1), unless the authorization is terminated  or revoked sooner.       Influenza A by PCR NEGATIVE NEGATIVE Final   Influenza B by PCR NEGATIVE NEGATIVE Final    Comment: (NOTE) The Xpert Xpress SARS-CoV-2/FLU/RSV plus assay is intended as an aid in the diagnosis of  influenza from Nasopharyngeal swab specimens and should not be used as a sole basis for treatment. Nasal washings and aspirates are unacceptable for Xpert Xpress SARS-CoV-2/FLU/RSV testing.  Fact Sheet for Patients: EntrepreneurPulse.com.au  Fact Sheet for Healthcare Providers: IncredibleEmployment.be  This test is not yet approved or cleared by the [place] FDA and has been authorized for detection and/or diagnosis of SARS-CoV-2 by FDA under an Emergency Use Authorization (EUA). This EUA will remain in effect (meaning this test can be used) for the duration of the COVID-19 declaration under Section 564(b)(1) of the Act, 21 U.S.C. section 360bbb-3(b)(1), unless the authorization is terminated or revoked.  Performed at [facility], [place] [address], [place], [place] [address]   Resp Panel by RT-PCR (Flu A&B, Covid) Nasopharyngeal Swab     Status: None   Collection Time: 08/26/21 10:28 AM   Specimen: Nasopharyngeal Swab; Nasopharyngeal(NP) swabs in vial transport medium  Result Value Ref Range Status   SARS Coronavirus 2 by RT PCR NEGATIVE NEGATIVE Final    Comment: (NOTE) SARS-CoV-2 target nucleic acids are NOT DETECTED.  The SARS-CoV-2 RNA is generally detectable in upper respiratory specimens during the acute phase of infection. The lowest concentration of SARS-CoV-2 viral copies this assay can detect is 138 copies/mL. A negative result does not preclude SARS-Cov-2 infection and should not  be used as the sole basis for treatment or other patient management decisions. A negative result may occur with  improper specimen collection/handling, submission of specimen other than nasopharyngeal swab, presence of viral mutation(s) within the areas targeted by this assay, and inadequate number of viral copies(<138 copies/mL). A negative result must be combined with clinical observations, patient history, and  epidemiological information. The expected result is Negative.  Fact Sheet for Patients:  EntrepreneurPulse.com.au  Fact Sheet for Healthcare Providers:  IncredibleEmployment.be  This test is no t yet approved or cleared by the [place] FDA and  has been authorized for detection and/or diagnosis of SARS-CoV-2 by FDA under an Emergency Use Authorization (EUA). This EUA will remain  in effect (meaning this test can be used) for the duration of the COVID-19 declaration under Section 564(b)(1) of the Act, 21 U.S.C.section 360bbb-3(b)(1), unless the authorization is terminated  or revoked sooner.       Influenza A by PCR NEGATIVE NEGATIVE Final   Influenza B by PCR NEGATIVE NEGATIVE Final    Comment: (NOTE) The Xpert Xpress SARS-CoV-2/FLU/RSV plus assay is intended as an aid in the diagnosis of influenza from Nasopharyngeal swab specimens and should not be used as a sole basis for treatment. Nasal washings and aspirates are unacceptable for Xpert Xpress SARS-CoV-2/FLU/RSV testing.  Fact Sheet for Patients: EntrepreneurPulse.com.au  Fact Sheet for Healthcare Providers: IncredibleEmployment.be  This test is not yet approved or cleared by the [place] FDA and has been authorized for detection and/or diagnosis of SARS-CoV-2 by FDA under an Emergency Use Authorization (EUA). This EUA will remain in effect (meaning this test can be used) for the duration of the COVID-19 declaration under Section 564(b)(1) of the Act, 21 U.S.C. section 360bbb-3(b)(1), unless the authorization is terminated or revoked.  Performed at [facility], [place] [address], [place], [place] [address]      Labs: BNP (last 3 results) Recent Labs    03/13/21 2314  BNP 78.9    Basic Metabolic Panel: Recent Labs  Lab 08/22/21 1643 08/22/21 1842 08/23/21 0540 08/24/21 0532  NA 140  --  139 141  K 2.4*  --   3.1* 4.2  CL 99  --  103 106  CO2 32  --  31 31  GLUCOSE 123*  --  85 77  BUN 10  --  7* 7*  CREATININE 0.61  --  0.50 0.50  CALCIUM 9.3  --  8.6* 9.1  MG  --  1.9  --  1.8  PHOS  --   --   --  3.2    Liver Function Tests: Recent Labs  Lab 08/22/21 1643 08/23/21 0540  AST 21 16  ALT 13 10  ALKPHOS 79 62  BILITOT 0.6 0.7  PROT 6.9 5.6*  ALBUMIN 3.5 2.9*    No results for input(s): LIPASE, AMYLASE in the last 168 hours. No results for input(s): AMMONIA in the last 168 hours. CBC: Recent Labs  Lab 08/22/21 1643 08/23/21 0540 08/24/21 0726  WBC 10.8* 5.3 4.2  NEUTROABS 8.7*  --   --   HGB 15.4* 12.1 12.4  HCT 46.7* 36.8 38.8  MCV 84.9 86.4 88.6  PLT 325 250 247    Cardiac Enzymes: Recent Labs  Lab 08/22/21 1643  CKTOTAL 82    BNP: Invalid input(s): POCBNP CBG: Recent Labs  Lab 08/26/21 2027 08/26/21 2318  GLUCAP 147* 116*   D-Dimer No results for input(s): DDIMER in the last 72 hours. Hgb A1c  No results for input(s): HGBA1C in the last 72 hours. Lipid Profile No results for input(s): CHOL, HDL, LDLCALC, TRIG, CHOLHDL, LDLDIRECT in the last 72 hours. Thyroid function studies No results for input(s): TSH, T4TOTAL, T3FREE, THYROIDAB in the last 72 hours.  Invalid input(s): FREET3 Anemia work up No results for input(s): VITAMINB12, FOLATE, FERRITIN, TIBC, IRON, RETICCTPCT in the last 72 hours. Urinalysis    Component Value Date/Time   COLORURINE YELLOW 08/22/2021 2141   APPEARANCEUR HAZY (A) 08/22/2021 2141   LABSPEC 1.012 08/22/2021 2141   PHURINE 6.0 08/22/2021 2141   GLUCOSEU NEGATIVE 08/22/2021 2141   HGBUR SMALL (A) 08/22/2021 2141   BILIRUBINUR NEGATIVE 08/22/2021 2141   KETONESUR NEGATIVE 08/22/2021 2141   PROTEINUR NEGATIVE 08/22/2021 2141   UROBILINOGEN 0.2 12/26/2019 1226   NITRITE NEGATIVE 08/22/2021 2141   LEUKOCYTESUR TRACE (A) 08/22/2021 2141   Sepsis Labs Invalid input(s): PROCALCITONIN,  WBC,   LACTICIDVEN Microbiology Recent Results (from the past 240 hour(s))  Resp Panel by RT-PCR (Flu A&B, Covid) Nasopharyngeal Swab     Status: None   Collection Time: 08/22/21  6:42 PM   Specimen: Nasopharyngeal Swab; Nasopharyngeal(NP) swabs in vial transport medium  Result Value Ref Range Status   SARS Coronavirus 2 by RT PCR NEGATIVE NEGATIVE Final    Comment: (NOTE) SARS-CoV-2 target nucleic acids are NOT DETECTED.  The SARS-CoV-2 RNA is generally detectable in upper respiratory specimens during the acute phase of infection. The lowest concentration of SARS-CoV-2 viral copies this assay can detect is 138 copies/mL. A negative result does not preclude SARS-Cov-2 infection and should not be used as the sole basis for treatment or other patient management decisions. A negative result may occur with  improper specimen collection/handling, submission of specimen other than nasopharyngeal swab, presence of viral mutation(s) within the areas targeted by this assay, and inadequate number of viral copies(<138 copies/mL). A negative result must be combined with clinical observations, patient history, and epidemiological information. The expected result is Negative.  Fact Sheet for Patients:  EntrepreneurPulse.com.au  Fact Sheet for Healthcare Providers:  [employer]  This test is no t yet approved or cleared by the [place] FDA and  has been authorized for detection and/or diagnosis of SARS-CoV-2 by FDA under an Emergency Use Authorization (EUA). This EUA will remain  in effect (meaning this test can be used) for the duration of the COVID-19 declaration under Section 564(b)(1) of the Act, 21 U.S.C.section 360bbb-3(b)(1), unless the authorization is terminated  or revoked sooner.       Influenza A by PCR NEGATIVE NEGATIVE Final   Influenza B by PCR NEGATIVE NEGATIVE Final    Comment: (NOTE) The Xpert Xpress SARS-CoV-2/FLU/RSV plus  assay is intended as an aid in the diagnosis of influenza from Nasopharyngeal swab specimens and should not be used as a sole basis for treatment. Nasal washings and aspirates are unacceptable for Xpert Xpress SARS-CoV-2/FLU/RSV testing.  Fact Sheet for Patients: EntrepreneurPulse.com.au  Fact Sheet for Healthcare Providers: [employer]  This test is not yet approved or cleared by the [place] FDA and has been authorized for detection and/or diagnosis of SARS-CoV-2 by FDA under an Emergency Use Authorization (EUA). This EUA will remain in effect (meaning this test can be used) for the duration of the COVID-19 declaration under Section 564(b)(1) of the Act, 21 U.S.C. section 360bbb-3(b)(1), unless the authorization is terminated or revoked.  Performed at [facility], [place] [address] [place], [place] [address]   Resp Panel by RT-PCR (Flu A&B,  Covid) Nasopharyngeal Swab     Status: None   Collection Time: 08/26/21 10:28 AM   Specimen: Nasopharyngeal Swab; Nasopharyngeal(NP) swabs in vial transport medium  Result Value Ref Range Status   SARS Coronavirus 2 by RT PCR NEGATIVE NEGATIVE Final    Comment: (NOTE) SARS-CoV-2 target nucleic acids are NOT DETECTED.  The SARS-CoV-2 RNA is generally detectable in upper respiratory specimens during the acute phase of infection. The lowest concentration of SARS-CoV-2 viral copies this assay can detect is 138 copies/mL. A negative result does not preclude SARS-Cov-2 infection and should not be used as the sole basis for treatment or other patient management decisions. A negative result may occur with  improper specimen collection/handling, submission of specimen other than nasopharyngeal swab, presence of viral mutation(s) within the areas targeted by this assay, and inadequate number of viral copies(<138 copies/mL). A negative result must be combined with clinical  observations, patient history, and epidemiological information. The expected result is Negative.  Fact Sheet for Patients:  EntrepreneurPulse.com.au  Fact Sheet for Healthcare Providers:  [employer]  This test is no t yet approved or cleared by the [place] FDA and  has been authorized for detection and/or diagnosis of SARS-CoV-2 by FDA under an Emergency Use Authorization (EUA). This EUA will remain  in effect (meaning this test can be used) for the duration of the COVID-19 declaration under Section 564(b)(1) of the Act, 21 U.S.C.section 360bbb-3(b)(1), unless the authorization is terminated  or revoked sooner.       Influenza A by PCR NEGATIVE NEGATIVE Final   Influenza B by PCR NEGATIVE NEGATIVE Final    Comment: (NOTE) The Xpert Xpress SARS-CoV-2/FLU/RSV plus assay is intended as an aid in the diagnosis of influenza from Nasopharyngeal swab specimens and should not be used as a sole basis for treatment. Nasal washings and aspirates are unacceptable for Xpert Xpress SARS-CoV-2/FLU/RSV testing.  Fact Sheet for Patients: EntrepreneurPulse.com.au  Fact Sheet for Healthcare Providers: [employer]  This test is not yet approved or cleared by the [place] FDA and has been authorized for detection and/or diagnosis of SARS-CoV-2 by FDA under an Emergency Use Authorization (EUA). This EUA will remain in effect (meaning this test can be used) for the duration of the COVID-19 declaration under Section 564(b)(1) of the Act, 21 U.S.C. section 360bbb-3(b)(1), unless the authorization is terminated or revoked.  Performed at [facility], [place] [address], [place], [place] [address]      Time coordinating discharge: Over 30 minutes  SIGNED:   Hosie Poisson, MD  [facility] 08/27/2021, 6:20 PM Pager   If 7PM-7AM, please contact night-coverage

## 2021-08-28 DIAGNOSIS — I10 Essential (primary) hypertension: Secondary | ICD-10-CM | POA: Diagnosis not present

## 2021-08-28 DIAGNOSIS — J984 Other disorders of lung: Secondary | ICD-10-CM | POA: Diagnosis not present

## 2021-08-28 DIAGNOSIS — Z743 Need for continuous supervision: Secondary | ICD-10-CM | POA: Diagnosis not present

## 2021-08-28 DIAGNOSIS — M47812 Spondylosis without myelopathy or radiculopathy, cervical region: Secondary | ICD-10-CM | POA: Diagnosis not present

## 2021-08-28 DIAGNOSIS — J323 Chronic sphenoidal sinusitis: Secondary | ICD-10-CM | POA: Diagnosis not present

## 2021-08-28 DIAGNOSIS — S20219A Contusion of unspecified front wall of thorax, initial encounter: Secondary | ICD-10-CM | POA: Diagnosis not present

## 2021-08-28 DIAGNOSIS — F32A Depression, unspecified: Secondary | ICD-10-CM | POA: Diagnosis not present

## 2021-08-28 DIAGNOSIS — E43 Unspecified severe protein-calorie malnutrition: Secondary | ICD-10-CM

## 2021-08-28 DIAGNOSIS — K219 Gastro-esophageal reflux disease without esophagitis: Secondary | ICD-10-CM | POA: Diagnosis not present

## 2021-08-28 DIAGNOSIS — I671 Cerebral aneurysm, nonruptured: Secondary | ICD-10-CM | POA: Diagnosis not present

## 2021-08-28 DIAGNOSIS — I679 Cerebrovascular disease, unspecified: Secondary | ICD-10-CM | POA: Diagnosis not present

## 2021-08-28 DIAGNOSIS — R627 Adult failure to thrive: Secondary | ICD-10-CM | POA: Diagnosis not present

## 2021-08-28 DIAGNOSIS — G40909 Epilepsy, unspecified, not intractable, without status epilepticus: Secondary | ICD-10-CM | POA: Diagnosis not present

## 2021-08-28 DIAGNOSIS — S20211A Contusion of right front wall of thorax, initial encounter: Secondary | ICD-10-CM | POA: Diagnosis not present

## 2021-08-28 DIAGNOSIS — Z8673 Personal history of transient ischemic attack (TIA), and cerebral infarction without residual deficits: Secondary | ICD-10-CM | POA: Diagnosis not present

## 2021-08-28 DIAGNOSIS — I959 Hypotension, unspecified: Secondary | ICD-10-CM | POA: Diagnosis not present

## 2021-08-28 DIAGNOSIS — F339 Major depressive disorder, recurrent, unspecified: Secondary | ICD-10-CM | POA: Diagnosis not present

## 2021-08-28 DIAGNOSIS — J320 Chronic maxillary sinusitis: Secondary | ICD-10-CM | POA: Diagnosis not present

## 2021-08-28 DIAGNOSIS — S0101XA Laceration without foreign body of scalp, initial encounter: Secondary | ICD-10-CM | POA: Diagnosis not present

## 2021-08-28 DIAGNOSIS — Z7401 Bed confinement status: Secondary | ICD-10-CM | POA: Diagnosis not present

## 2021-08-28 DIAGNOSIS — M542 Cervicalgia: Secondary | ICD-10-CM | POA: Diagnosis not present

## 2021-08-28 DIAGNOSIS — W19XXXA Unspecified fall, initial encounter: Secondary | ICD-10-CM | POA: Diagnosis not present

## 2021-08-28 DIAGNOSIS — G8929 Other chronic pain: Secondary | ICD-10-CM | POA: Diagnosis not present

## 2021-08-28 DIAGNOSIS — G459 Transient cerebral ischemic attack, unspecified: Secondary | ICD-10-CM | POA: Diagnosis not present

## 2021-08-28 DIAGNOSIS — M6281 Muscle weakness (generalized): Secondary | ICD-10-CM | POA: Diagnosis not present

## 2021-08-28 DIAGNOSIS — R58 Hemorrhage, not elsewhere classified: Secondary | ICD-10-CM | POA: Diagnosis not present

## 2021-08-28 DIAGNOSIS — R2681 Unsteadiness on feet: Secondary | ICD-10-CM | POA: Diagnosis not present

## 2021-08-28 DIAGNOSIS — W01198A Fall on same level from slipping, tripping and stumbling with subsequent striking against other object, initial encounter: Secondary | ICD-10-CM | POA: Diagnosis not present

## 2021-08-28 DIAGNOSIS — S2239XD Fracture of one rib, unspecified side, subsequent encounter for fracture with routine healing: Secondary | ICD-10-CM | POA: Diagnosis not present

## 2021-08-28 DIAGNOSIS — R41841 Cognitive communication deficit: Secondary | ICD-10-CM | POA: Diagnosis not present

## 2021-08-28 DIAGNOSIS — R9082 White matter disease, unspecified: Secondary | ICD-10-CM | POA: Diagnosis not present

## 2021-08-28 DIAGNOSIS — R5381 Other malaise: Secondary | ICD-10-CM

## 2021-08-28 DIAGNOSIS — S0990XA Unspecified injury of head, initial encounter: Secondary | ICD-10-CM | POA: Diagnosis not present

## 2021-08-28 DIAGNOSIS — D649 Anemia, unspecified: Secondary | ICD-10-CM | POA: Diagnosis not present

## 2021-08-28 DIAGNOSIS — R2689 Other abnormalities of gait and mobility: Secondary | ICD-10-CM | POA: Diagnosis not present

## 2021-08-28 DIAGNOSIS — R279 Unspecified lack of coordination: Secondary | ICD-10-CM | POA: Diagnosis not present

## 2021-08-28 DIAGNOSIS — M6259 Muscle wasting and atrophy, not elsewhere classified, multiple sites: Secondary | ICD-10-CM | POA: Diagnosis not present

## 2021-08-28 DIAGNOSIS — S0093XA Contusion of unspecified part of head, initial encounter: Secondary | ICD-10-CM | POA: Diagnosis not present

## 2021-08-28 DIAGNOSIS — R0781 Pleurodynia: Secondary | ICD-10-CM | POA: Diagnosis not present

## 2021-08-28 DIAGNOSIS — M4312 Spondylolisthesis, cervical region: Secondary | ICD-10-CM | POA: Diagnosis not present

## 2021-08-28 DIAGNOSIS — R1312 Dysphagia, oropharyngeal phase: Secondary | ICD-10-CM | POA: Diagnosis not present

## 2021-08-28 NOTE — Discharge Summary (Signed)
Physician Discharge Summary  Elaine Rivera LYY:[id number] DOB: Apr 21, 1946 DOA: 08/22/2021  PCP: System, Provider Not In  Admit date: 08/22/2021  Discharge date: 08/28/2021  Admitted From: Home.  Disposition: SNF  Recommendations for Outpatient Follow-up:  Follow up with PCP in 1-2 weeks Please obtain BMP/CBC in one week Advised to take Keppra 500 mg twice daily for history of seizures. Advised to take Remeron 7.5 mg daily for depression  Home Health:None Equipment/Devices:None  Discharge Condition: Stable CODE STATUS:Full code Diet recommendation: Heart Healthy   Brief [facility] course: This 75 years old female with PMH significant for depression, anemia, scoliosis, GERD, hypertension presented in the ED with complaints of generalized fatigue and tiredness. Patient was brought in the ED as she was not able to take care of herself.  Patient has chronic pain which has been progressively worse and she is not able to ambulate from her couch.  She was spending most of the time in the same position in the recliner and she was also found covered in the feces and urine.  Patient reports feeling depressed but denies any suicidal/ homicidal ideations.  She also reported having difficulty going to sleep and staying asleep. Patient was admitted for generalized weakness, failure to thrive, major depression. Psych was consulted.  Stated patient is severely depressed and they agree with continuation of Remeron.  Patient significantly improved with initiation of Remeron.  She has improved appetite, She is more alert,  cooperative and following commands.  Patient denies any suicidal / homicidal ideation.  Psychiatry states patient is not suitable for inpatient psych hospitalization.  Patient seems severely deconditioned requiring physical therapy and rehab.  Patient is being discharged to skilled nursing facility for rehab.   She was managed for below problems.   Discharge Diagnoses:   Principal Problem:   Failure to thrive in adult Active Problems:   Scoliosis   Depression   Cerebral aneurysm   Hypokalemia   Lung nodule   Seizure (HCC)   CVA (cerebral vascular accident) ([place])   Right 10th rib fracture   Physical deconditioning   Protein-calorie malnutrition, severe  Failure to thrive: Patient was brought in by daughter, Her clothes were soaked in urine and feces at arrival. Patient lives at home but unable to take care of herself and now unable to ambulate, mostly remains in her recliner.   Patient also had a fall few days back.  Suspect could be secondary to depression. Continue Remeron 7.5 mg daily. Psychiatry consulted,  recommended to continue Remeron. Patient is not appropriate for inpatient psych hospitalization. Dietitian consult.  Patient's appetite has improved with initiation of Remeron.   We will continue Remeron.   Hypokalemia: Replaced and improved.     Major depression: > Improving It appears like patient is having significant depressive episode. She denies any suicidal / Homicidal ideations. Continue Remeron 7.5 mg daily. Psychiatry consulted,  recommended to continue the same. Patient appears more interactive, smiling and happy today. She has improved appetite.   History of CVA: Patient does have history of ischemic CVA,  there is no focal neurological deficits on exam. She does not take any aspirin or statins.   Hx. Of Cerebral aneurysm: Prior history of coiling and embolization.   Physical deconditioning: Patient reported generalized weakness likely due to her chronic pain. X-rays negative for any acute fracture or dislocation. Physical Exam is nonfocal. PT and OT eval recommended skilled nursing facility.   Right 10th rib fracture: Incidental finding.  Patient denies any chest pain.  History of seizure disorders: Patient was not taking Keppra but will resume Keppra. Continue Keppra.   Lung nodule: She is found to have 7  mm right lobe lung nodules.   CT scan is recommended in 3 to 6 months. Outpatient follow up.   Scoliosis Causing chronic pain.  Continue gentle narcotic regimen.  Discharge Instructions  Discharge Instructions     Call MD for:  difficulty breathing, headache or visual disturbances   Complete by: As directed    Call MD for:  persistant dizziness or light-headedness   Complete by: As directed    Call MD for:  persistant nausea and vomiting   Complete by: As directed    Diet - low sodium heart healthy   Complete by: As directed    Diet Carb Modified   Complete by: As directed    Discharge instructions   Complete by: As directed    Advised to follow-up with primary care physician in 1 week. Advised to take Keppra 500 mg twice daily for history of seizures. Advised to take Remeron 7.5 mg daily for depression.   Increase activity slowly   Complete by: As directed       Allergies as of 08/28/2021       Reactions   Sulfa Antibiotics Other (See Comments)   Reaction:  Unknown         Medication List     STOP taking these medications    ALPRAZolam 0.5 MG tablet Commonly known as: XANAX   atorvastatin 40 MG tablet Commonly known as: LIPITOR   temazepam 15 MG capsule Commonly known as: RESTORIL       TAKE these medications    levETIRAcetam 500 MG tablet Commonly known as: Keppra Take 1 tablet (500 mg total) by mouth 2 (two) times daily.   mirtazapine 7.5 MG tablet Commonly known as: REMERON Take 1 tablet (7.5 mg total) by mouth at bedtime.        Contact information for after-discharge care     Destination     [facility] .   Service: Skilled Nursing Contact information: [place] [id number] [phone]                    Allergies  Allergen Reactions   Sulfa Antibiotics Other (See Comments)    Reaction:  Unknown     Consultations: Psychiatry   Procedures/Studies: DG Chest 2  View  Result Date: 08/22/2021 CLINICAL DATA:  Fall. EXAM: CHEST - 2 VIEW COMPARISON:  Chest CT dated 03/06/2021. FINDINGS: No focal consolidation, pleural effusion, pneumothorax. Mild cardiomegaly. Atherosclerotic calcification of the aorta. Osteopenia with scoliosis and degenerative changes of the spine. Faint linear lucency through the posterior right tenth rib concerning for a fracture. Correlation with point tenderness recommended. Evaluation of the osseous structures is very limited due to osteopenia and scoliosis. IMPRESSION: 1. No acute cardiopulmonary process. 2. Findings concerning for a posterior right tenth rib fracture. Electronically Signed   By: Anner Crete M.D.   On: 08/22/2021 20:13   DG Thoracic Spine 2 View  Result Date: 08/22/2021 CLINICAL DATA:  Fall. EXAM: THORACIC SPINE 2 VIEWS; LUMBAR SPINE - COMPLETE 4+ VIEW COMPARISON:  None. FINDINGS: Thoracic spine: There is marked dextroconvex curvature of the mid/lower thoracic spine. There is no definite fracture or dislocation, although scoliosis limits evaluation. There surgical clips in the lower left neck. No other significant bone abnormalities are identified. Lumbar spine: There is marked levoconvex curvature of the  lumbar spine with multilevel degenerative change. No definite acute fracture or dislocation, although scoliosis limits evaluation. There are moderate degenerative changes throughout the lumbar spine. Soft tissues are unremarkable. IMPRESSION: 1. Marked thoracolumbar scoliosis. 2. No acute bony abnormality. 3. Multilevel degenerative changes. Electronically Signed   By: Ronney Asters M.D.   On: 08/22/2021 20:19   DG Lumbar Spine Complete  Result Date: 08/22/2021 CLINICAL DATA:  Fall. EXAM: THORACIC SPINE 2 VIEWS; LUMBAR SPINE - COMPLETE 4+ VIEW COMPARISON:  None. FINDINGS: Thoracic spine: There is marked dextroconvex curvature of the mid/lower thoracic spine. There is no definite fracture or dislocation, although  scoliosis limits evaluation. There surgical clips in the lower left neck. No other significant bone abnormalities are identified. Lumbar spine: There is marked levoconvex curvature of the lumbar spine with multilevel degenerative change. No definite acute fracture or dislocation, although scoliosis limits evaluation. There are moderate degenerative changes throughout the lumbar spine. Soft tissues are unremarkable. IMPRESSION: 1. Marked thoracolumbar scoliosis. 2. No acute bony abnormality. 3. Multilevel degenerative changes. Electronically Signed   By: Ronney Asters M.D.   On: 08/22/2021 20:19   DG Pelvis 1-2 Views  Result Date: 08/22/2021 CLINICAL DATA:  Initial evaluation for acute trauma, fall. EXAM: PELVIS - 1-2 VIEW COMPARISON:  None. FINDINGS: No acute fracture dislocation. Femoral heads in normal alignment within the acetabula. Femoral head height maintained. SI joints approximated. No pubic diastasis. Underlying osteopenia noted. Severe lumbar levoscoliosis with multilevel spondylosis noted. Visualized soft tissues demonstrate no acute finding. IMPRESSION: No acute osseous abnormality about the pelvis. Electronically Signed   By: Jeannine Boga M.D.   On: 08/22/2021 20:13   CT HEAD WO CONTRAST (5MM)  Result Date: 08/22/2021 CLINICAL DATA:  Head trauma, moderate to severe. Increasing falls and difficulty walking. EXAM: CT HEAD WITHOUT CONTRAST CT CERVICAL SPINE WITHOUT CONTRAST TECHNIQUE: Multidetector CT imaging of the head and cervical spine was performed following the standard protocol without intravenous contrast. Multiplanar CT image reconstructions of the cervical spine were also generated. COMPARISON:  CT head 03/04/2021 and 03/13/2021, CTA 03/14/2021 and MRI 03/14/2021. FINDINGS: CT HEAD FINDINGS Brain: Again demonstrated is a hyperdense mass with peripheral calcifications at the right cerebellopontine angle, measuring 2.2 x 1.6 cm on image 6/3, consistent with a partially thrombosed  right PICA aneurysm. This does not appear significantly changed from previous CTs. There is no evidence of acute intracranial hemorrhage, mass lesion, brain edema or extra-axial fluid collection. Stable generalized atrophy with disproportionate dilatation of the lateral and 3rd ventricles, similar to previous study. There is confluent low-density within the periventricular white matter. There is no CT evidence of acute cortical infarction. Vascular: As above, partially thrombosed right PICA aneurysm, grossly stable. There is a vascular stent within the right vertebrobasilar system. Skull: Previous right suboccipital craniotomy. No acute skull findings. Sinuses/Orbits: Mild chronic ethmoid sinus mucosal thickening without air-fluid levels. The additional visualized paranasal sinuses, mastoid air cells and middle ears are clear. No significant orbital findings. Other: None. CT CERVICAL SPINE FINDINGS Alignment: Mild degenerative anterolisthesis at C5-6. Skull base and vertebrae: No evidence of acute cervical spine fracture or traumatic subluxation. Soft tissues and spinal canal: No prevertebral fluid or swelling. No visible canal hematoma. Disc levels: Multilevel spondylosis with disc space narrowing, uncinate spurring and facet hypertrophy. No large disc herniation identified. Upper chest: Scarring at both lung apices. Other: Postsurgical changes at the right skull base and partially thrombosed right PICA aneurysm as noted above. IMPRESSION: 1. No definite acute intracranial findings. 2. Similar appearance of  partially thrombosed right PICA aneurysm with adjacent vascular stent and postsurgical changes. 3. Similar ventriculomegaly which may relate to normal pressure hydrocephalus or central atrophy. 4. No evidence of acute cervical spine fracture, traumatic subluxation or static signs of instability. Multilevel cervical spondylosis. Electronically Signed   By: Richardean Sale M.D.   On: 08/22/2021 19:18   CT  Cervical Spine Wo Contrast  Result Date: 08/22/2021 CLINICAL DATA:  Head trauma, moderate to severe. Increasing falls and difficulty walking. EXAM: CT HEAD WITHOUT CONTRAST CT CERVICAL SPINE WITHOUT CONTRAST TECHNIQUE: Multidetector CT imaging of the head and cervical spine was performed following the standard protocol without intravenous contrast. Multiplanar CT image reconstructions of the cervical spine were also generated. COMPARISON:  CT head 03/04/2021 and 03/13/2021, CTA 03/14/2021 and MRI 03/14/2021. FINDINGS: CT HEAD FINDINGS Brain: Again demonstrated is a hyperdense mass with peripheral calcifications at the right cerebellopontine angle, measuring 2.2 x 1.6 cm on image 6/3, consistent with a partially thrombosed right PICA aneurysm. This does not appear significantly changed from previous CTs. There is no evidence of acute intracranial hemorrhage, mass lesion, brain edema or extra-axial fluid collection. Stable generalized atrophy with disproportionate dilatation of the lateral and 3rd ventricles, similar to previous study. There is confluent low-density within the periventricular white matter. There is no CT evidence of acute cortical infarction. Vascular: As above, partially thrombosed right PICA aneurysm, grossly stable. There is a vascular stent within the right vertebrobasilar system. Skull: Previous right suboccipital craniotomy. No acute skull findings. Sinuses/Orbits: Mild chronic ethmoid sinus mucosal thickening without air-fluid levels. The additional visualized paranasal sinuses, mastoid air cells and middle ears are clear. No significant orbital findings. Other: None. CT CERVICAL SPINE FINDINGS Alignment: Mild degenerative anterolisthesis at C5-6. Skull base and vertebrae: No evidence of acute cervical spine fracture or traumatic subluxation. Soft tissues and spinal canal: No prevertebral fluid or swelling. No visible canal hematoma. Disc levels: Multilevel spondylosis with disc space  narrowing, uncinate spurring and facet hypertrophy. No large disc herniation identified. Upper chest: Scarring at both lung apices. Other: Postsurgical changes at the right skull base and partially thrombosed right PICA aneurysm as noted above. IMPRESSION: 1. No definite acute intracranial findings. 2. Similar appearance of partially thrombosed right PICA aneurysm with adjacent vascular stent and postsurgical changes. 3. Similar ventriculomegaly which may relate to normal pressure hydrocephalus or central atrophy. 4. No evidence of acute cervical spine fracture, traumatic subluxation or static signs of instability. Multilevel cervical spondylosis. Electronically Signed   By: Richardean Sale M.D.   On: 08/22/2021 19:18      Subjective: Patient was seen and examined at bedside.  Overnight events noted.  Patient reports feeling much improved.   Patient has improved appetite, She is more cooperative,  coherent , communicative and interactive.   Patient is being discharged to skilled nursing facility for rehab  Discharge Exam: Vitals:   08/27/21 2027 08/28/21 0350  BP: 116/86 130/79  Pulse: 89 78  Resp: 18 20  Temp: 98.7 F (37.1 C) 97.8 F (36.6 C)  SpO2: 96% 97%   Vitals:   08/27/21 0327 08/27/21 1336 08/27/21 2027 08/28/21 0350  BP: 135/82 112/75 116/86 130/79  Pulse: 72 89 89 78  Resp: 18 16 18 20   Temp: 97.9 F (36.6 C) 97.8 F (36.6 C) 98.7 F (37.1 C) 97.8 F (36.6 C)  TempSrc: Oral Oral Oral Oral  SpO2: 98% 97% 96% 97%  Weight:      Height:        General: Pt  is alert, awake, not in acute distress Cardiovascular: RRR, S1/S2 +, no rubs, no gallops Respiratory: CTA bilaterally, no wheezing, no rhonchi Abdominal: Soft, NT, ND, bowel sounds + Extremities: no edema, no cyanosis    The results of significant diagnostics from this hospitalization (including imaging, microbiology, ancillary and laboratory) are listed below for reference.     Microbiology: Recent Results  (from the past 240 hour(s))  Resp Panel by RT-PCR (Flu A&B, Covid) Nasopharyngeal Swab     Status: None   Collection Time: 08/22/21  6:42 PM   Specimen: Nasopharyngeal Swab; Nasopharyngeal(NP) swabs in vial transport medium  Result Value Ref Range Status   SARS Coronavirus 2 by RT PCR NEGATIVE NEGATIVE Final    Comment: (NOTE) SARS-CoV-2 target nucleic acids are NOT DETECTED.  The SARS-CoV-2 RNA is generally detectable in upper respiratory specimens during the acute phase of infection. The lowest concentration of SARS-CoV-2 viral copies this assay can detect is 138 copies/mL. A negative result does not preclude SARS-Cov-2 infection and should not be used as the sole basis for treatment or other patient management decisions. A negative result may occur with  improper specimen collection/handling, submission of specimen other than nasopharyngeal swab, presence of viral mutation(s) within the areas targeted by this assay, and inadequate number of viral copies(<138 copies/mL). A negative result must be combined with clinical observations, patient history, and epidemiological information. The expected result is Negative.  Fact Sheet for Patients:  EntrepreneurPulse.com.au  Fact Sheet for Healthcare Providers:  IncredibleEmployment.be  This test is no t yet approved or cleared by the [place] FDA and  has been authorized for detection and/or diagnosis of SARS-CoV-2 by FDA under an Emergency Use Authorization (EUA). This EUA will remain  in effect (meaning this test can be used) for the duration of the COVID-19 declaration under Section 564(b)(1) of the Act, 21 U.S.C.section 360bbb-3(b)(1), unless the authorization is terminated  or revoked sooner.       Influenza A by PCR NEGATIVE NEGATIVE Final   Influenza B by PCR NEGATIVE NEGATIVE Final    Comment: (NOTE) The Xpert Xpress SARS-CoV-2/FLU/RSV plus assay is intended as an aid in the  diagnosis of influenza from Nasopharyngeal swab specimens and should not be used as a sole basis for treatment. Nasal washings and aspirates are unacceptable for Xpert Xpress SARS-CoV-2/FLU/RSV testing.  Fact Sheet for Patients: EntrepreneurPulse.com.au  Fact Sheet for Healthcare Providers: IncredibleEmployment.be  This test is not yet approved or cleared by the [place] FDA and has been authorized for detection and/or diagnosis of SARS-CoV-2 by FDA under an Emergency Use Authorization (EUA). This EUA will remain in effect (meaning this test can be used) for the duration of the COVID-19 declaration under Section 564(b)(1) of the Act, 21 U.S.C. section 360bbb-3(b)(1), unless the authorization is terminated or revoked.  Performed at [facility], [place] [address] [place], [place] [address]   Resp Panel by RT-PCR (Flu A&B, Covid) Nasopharyngeal Swab     Status: None   Collection Time: 08/26/21 10:28 AM   Specimen: Nasopharyngeal Swab; Nasopharyngeal(NP) swabs in vial transport medium  Result Value Ref Range Status   SARS Coronavirus 2 by RT PCR NEGATIVE NEGATIVE Final    Comment: (NOTE) SARS-CoV-2 target nucleic acids are NOT DETECTED.  The SARS-CoV-2 RNA is generally detectable in upper respiratory specimens during the acute phase of infection. The lowest concentration of SARS-CoV-2 viral copies this assay can detect is 138 copies/mL. A negative result does not preclude SARS-Cov-2 infection and should not  be used as the sole basis for treatment or other patient management decisions. A negative result may occur with  improper specimen collection/handling, submission of specimen other than nasopharyngeal swab, presence of viral mutation(s) within the areas targeted by this assay, and inadequate number of viral copies(<138 copies/mL). A negative result must be combined with clinical observations, patient history, and  epidemiological information. The expected result is Negative.  Fact Sheet for Patients:  EntrepreneurPulse.com.au  Fact Sheet for Healthcare Providers:  IncredibleEmployment.be  This test is no t yet approved or cleared by the [place] FDA and  has been authorized for detection and/or diagnosis of SARS-CoV-2 by FDA under an Emergency Use Authorization (EUA). This EUA will remain  in effect (meaning this test can be used) for the duration of the COVID-19 declaration under Section 564(b)(1) of the Act, 21 U.S.C.section 360bbb-3(b)(1), unless the authorization is terminated  or revoked sooner.       Influenza A by PCR NEGATIVE NEGATIVE Final   Influenza B by PCR NEGATIVE NEGATIVE Final    Comment: (NOTE) The Xpert Xpress SARS-CoV-2/FLU/RSV plus assay is intended as an aid in the diagnosis of influenza from Nasopharyngeal swab specimens and should not be used as a sole basis for treatment. Nasal washings and aspirates are unacceptable for Xpert Xpress SARS-CoV-2/FLU/RSV testing.  Fact Sheet for Patients: EntrepreneurPulse.com.au  Fact Sheet for Healthcare Providers: IncredibleEmployment.be  This test is not yet approved or cleared by the [place] FDA and has been authorized for detection and/or diagnosis of SARS-CoV-2 by FDA under an Emergency Use Authorization (EUA). This EUA will remain in effect (meaning this test can be used) for the duration of the COVID-19 declaration under Section 564(b)(1) of the Act, 21 U.S.C. section 360bbb-3(b)(1), unless the authorization is terminated or revoked.  Performed at [facility] [place] [address] [place], [place] [address]      Labs: BNP (last 3 results) Recent Labs    03/13/21 2314  BNP 16.0    Basic Metabolic Panel: Recent Labs  Lab 08/22/21 1643 08/22/21 1842 08/23/21 0540 08/24/21 0532  NA 140  --  139 141  K 2.4*  --   3.1* 4.2  CL 99  --  103 106  CO2 32  --  31 31  GLUCOSE 123*  --  85 77  BUN 10  --  7* 7*  CREATININE 0.61  --  0.50 0.50  CALCIUM 9.3  --  8.6* 9.1  MG  --  1.9  --  1.8  PHOS  --   --   --  3.2    Liver Function Tests: Recent Labs  Lab 08/22/21 1643 08/23/21 0540  AST 21 16  ALT 13 10  ALKPHOS 79 62  BILITOT 0.6 0.7  PROT 6.9 5.6*  ALBUMIN 3.5 2.9*    No results for input(s): LIPASE, AMYLASE in the last 168 hours. No results for input(s): AMMONIA in the last 168 hours. CBC: Recent Labs  Lab 08/22/21 1643 08/23/21 0540 08/24/21 0726  WBC 10.8* 5.3 4.2  NEUTROABS 8.7*  --   --   HGB 15.4* 12.1 12.4  HCT 46.7* 36.8 38.8  MCV 84.9 86.4 88.6  PLT 325 250 247    Cardiac Enzymes: Recent Labs  Lab 08/22/21 1643  CKTOTAL 82    BNP: Invalid input(s): POCBNP CBG: Recent Labs  Lab 08/26/21 2027 08/26/21 2318  GLUCAP 147* 116*    D-Dimer No results for input(s): DDIMER in the last 72 hours. Hgb  A1c No results for input(s): HGBA1C in the last 72 hours. Lipid Profile No results for input(s): CHOL, HDL, LDLCALC, TRIG, CHOLHDL, LDLDIRECT in the last 72 hours. Thyroid function studies No results for input(s): TSH, T4TOTAL, T3FREE, THYROIDAB in the last 72 hours.  Invalid input(s): FREET3 Anemia work up No results for input(s): VITAMINB12, FOLATE, FERRITIN, TIBC, IRON, RETICCTPCT in the last 72 hours. Urinalysis    Component Value Date/Time   COLORURINE YELLOW 08/22/2021 2141   APPEARANCEUR HAZY (A) 08/22/2021 2141   LABSPEC 1.012 08/22/2021 2141   PHURINE 6.0 08/22/2021 2141   GLUCOSEU NEGATIVE 08/22/2021 2141   HGBUR SMALL (A) 08/22/2021 2141   BILIRUBINUR NEGATIVE 08/22/2021 2141   KETONESUR NEGATIVE 08/22/2021 2141   PROTEINUR NEGATIVE 08/22/2021 2141   UROBILINOGEN 0.2 12/26/2019 1226   NITRITE NEGATIVE 08/22/2021 2141   LEUKOCYTESUR TRACE (A) 08/22/2021 2141   Sepsis Labs Invalid input(s): PROCALCITONIN,  WBC,   LACTICIDVEN Microbiology Recent Results (from the past 240 hour(s))  Resp Panel by RT-PCR (Flu A&B, Covid) Nasopharyngeal Swab     Status: None   Collection Time: 08/22/21  6:42 PM   Specimen: Nasopharyngeal Swab; Nasopharyngeal(NP) swabs in vial transport medium  Result Value Ref Range Status   SARS Coronavirus 2 by RT PCR NEGATIVE NEGATIVE Final    Comment: (NOTE) SARS-CoV-2 target nucleic acids are NOT DETECTED.  The SARS-CoV-2 RNA is generally detectable in upper respiratory specimens during the acute phase of infection. The lowest concentration of SARS-CoV-2 viral copies this assay can detect is 138 copies/mL. A negative result does not preclude SARS-Cov-2 infection and should not be used as the sole basis for treatment or other patient management decisions. A negative result may occur with  improper specimen collection/handling, submission of specimen other than nasopharyngeal swab, presence of viral mutation(s) within the areas targeted by this assay, and inadequate number of viral copies(<138 copies/mL). A negative result must be combined with clinical observations, patient history, and epidemiological information. The expected result is Negative.  Fact Sheet for Patients:  EntrepreneurPulse.com.au  Fact Sheet for Healthcare Providers:  IncredibleEmployment.be  This test is no t yet approved or cleared by the [place] FDA and  has been authorized for detection and/or diagnosis of SARS-CoV-2 by FDA under an Emergency Use Authorization (EUA). This EUA will remain  in effect (meaning this test can be used) for the duration of the COVID-19 declaration under Section 564(b)(1) of the Act, 21 U.S.C.section 360bbb-3(b)(1), unless the authorization is terminated  or revoked sooner.       Influenza A by PCR NEGATIVE NEGATIVE Final   Influenza B by PCR NEGATIVE NEGATIVE Final    Comment: (NOTE) The Xpert Xpress SARS-CoV-2/FLU/RSV plus  assay is intended as an aid in the diagnosis of influenza from Nasopharyngeal swab specimens and should not be used as a sole basis for treatment. Nasal washings and aspirates are unacceptable for Xpert Xpress SARS-CoV-2/FLU/RSV testing.  Fact Sheet for Patients: EntrepreneurPulse.com.au  Fact Sheet for Healthcare Providers: IncredibleEmployment.be  This test is not yet approved or cleared by the [place] FDA and has been authorized for detection and/or diagnosis of SARS-CoV-2 by FDA under an Emergency Use Authorization (EUA). This EUA will remain in effect (meaning this test can be used) for the duration of the COVID-19 declaration under Section 564(b)(1) of the Act, 21 U.S.C. section 360bbb-3(b)(1), unless the authorization is terminated or revoked.  Performed at [facility], [place] [address] [place], [place] [address]   Resp Panel by RT-PCR (Flu  A&B, Covid) Nasopharyngeal Swab     Status: None   Collection Time: 08/26/21 10:28 AM   Specimen: Nasopharyngeal Swab; Nasopharyngeal(NP) swabs in vial transport medium  Result Value Ref Range Status   SARS Coronavirus 2 by RT PCR NEGATIVE NEGATIVE Final    Comment: (NOTE) SARS-CoV-2 target nucleic acids are NOT DETECTED.  The SARS-CoV-2 RNA is generally detectable in upper respiratory specimens during the acute phase of infection. The lowest concentration of SARS-CoV-2 viral copies this assay can detect is 138 copies/mL. A negative result does not preclude SARS-Cov-2 infection and should not be used as the sole basis for treatment or other patient management decisions. A negative result may occur with  improper specimen collection/handling, submission of specimen other than nasopharyngeal swab, presence of viral mutation(s) within the areas targeted by this assay, and inadequate number of viral copies(<138 copies/mL). A negative result must be combined with clinical  observations, patient history, and epidemiological information. The expected result is Negative.  Fact Sheet for Patients:  EntrepreneurPulse.com.au  Fact Sheet for Healthcare Providers:  IncredibleEmployment.be  This test is no t yet approved or cleared by the [place] FDA and  has been authorized for detection and/or diagnosis of SARS-CoV-2 by FDA under an Emergency Use Authorization (EUA). This EUA will remain  in effect (meaning this test can be used) for the duration of the COVID-19 declaration under Section 564(b)(1) of the Act, 21 U.S.C.section 360bbb-3(b)(1), unless the authorization is terminated  or revoked sooner.       Influenza A by PCR NEGATIVE NEGATIVE Final   Influenza B by PCR NEGATIVE NEGATIVE Final    Comment: (NOTE) The Xpert Xpress SARS-CoV-2/FLU/RSV plus assay is intended as an aid in the diagnosis of influenza from Nasopharyngeal swab specimens and should not be used as a sole basis for treatment. Nasal washings and aspirates are unacceptable for Xpert Xpress SARS-CoV-2/FLU/RSV testing.  Fact Sheet for Patients: EntrepreneurPulse.com.au  Fact Sheet for Healthcare Providers: IncredibleEmployment.be  This test is not yet approved or cleared by the [place] FDA and has been authorized for detection and/or diagnosis of SARS-CoV-2 by FDA under an Emergency Use Authorization (EUA). This EUA will remain in effect (meaning this test can be used) for the duration of the COVID-19 declaration under Section 564(b)(1) of the Act, 21 U.S.C. section 360bbb-3(b)(1), unless the authorization is terminated or revoked.  Performed at [facility], [place] [address], [place], [place] [address]      Time coordinating discharge: Over 30 minutes  SIGNED:   Hosie Poisson, MD  [facility] 08/28/2021, 8:54 AM Pager   If 7PM-7AM, please contact night-coverage

## 2021-08-28 NOTE — Plan of Care (Signed)
  Problem: Health Behavior/Discharge Planning: Goal: Ability to manage health-related needs will improve Outcome: Progressing   Problem: Activity: Goal: Risk for activity intolerance will decrease Outcome: Progressing   Problem: Nutrition: Goal: Adequate nutrition will be maintained Outcome: Progressing   

## 2021-08-28 NOTE — Progress Notes (Signed)
Called report to [facility].

## 2021-08-28 NOTE — TOC Transition Note (Signed)
Transition of Care [facility]) - CM/SW Discharge Note   Patient Details  Name: Elaine Rivera MRN: [id number] Date of Birth: 1946/07/13  [facility] CM/SW Contact:  Trish Mage, LCSW Phone Number: 08/28/2021, 11:55 AM   Clinical Narrative:   Patient who is stable for d/c will transfer to [facility] today.  PTAR arranged.  Nursing, please call report to [phone], bed 806b. TOC sign off.    Final next level of care: Skilled Nursing Facility Barriers to Discharge: Barriers Resolved   Patient Goals and CMS Choice     Choice offered to / list presented to : Patient  Discharge Placement                       Discharge Plan and Services   Discharge Planning Services: CM Consult Post Acute Care Choice: [place]                               Social Determinants of Health (SDOH) Interventions     Readmission Risk Interventions No flowsheet data found.

## 2021-08-29 DIAGNOSIS — F339 Major depressive disorder, recurrent, unspecified: Secondary | ICD-10-CM | POA: Diagnosis not present

## 2021-08-29 DIAGNOSIS — R627 Adult failure to thrive: Secondary | ICD-10-CM | POA: Diagnosis not present

## 2021-08-29 DIAGNOSIS — I679 Cerebrovascular disease, unspecified: Secondary | ICD-10-CM | POA: Diagnosis not present

## 2021-08-29 DIAGNOSIS — G8929 Other chronic pain: Secondary | ICD-10-CM | POA: Diagnosis not present

## 2021-09-01 DIAGNOSIS — G8929 Other chronic pain: Secondary | ICD-10-CM | POA: Diagnosis not present

## 2021-09-01 DIAGNOSIS — I959 Hypotension, unspecified: Secondary | ICD-10-CM | POA: Diagnosis not present

## 2021-09-01 DIAGNOSIS — F339 Major depressive disorder, recurrent, unspecified: Secondary | ICD-10-CM | POA: Diagnosis not present

## 2021-09-01 DIAGNOSIS — R627 Adult failure to thrive: Secondary | ICD-10-CM | POA: Diagnosis not present

## 2021-09-02 DIAGNOSIS — I10 Essential (primary) hypertension: Secondary | ICD-10-CM | POA: Diagnosis not present

## 2021-09-05 DIAGNOSIS — D649 Anemia, unspecified: Secondary | ICD-10-CM | POA: Diagnosis not present

## 2021-09-05 DIAGNOSIS — I10 Essential (primary) hypertension: Secondary | ICD-10-CM | POA: Diagnosis not present

## 2021-09-15 DIAGNOSIS — D649 Anemia, unspecified: Secondary | ICD-10-CM | POA: Diagnosis not present

## 2021-09-15 DIAGNOSIS — Z8673 Personal history of transient ischemic attack (TIA), and cerebral infarction without residual deficits: Secondary | ICD-10-CM | POA: Diagnosis not present

## 2021-09-15 DIAGNOSIS — R627 Adult failure to thrive: Secondary | ICD-10-CM | POA: Diagnosis not present

## 2021-09-15 DIAGNOSIS — I10 Essential (primary) hypertension: Secondary | ICD-10-CM | POA: Diagnosis not present

## 2021-09-16 ENCOUNTER — Encounter (HOSPITAL_COMMUNITY): Payer: Self-pay | Admitting: Emergency Medicine

## 2021-09-16 ENCOUNTER — Other Ambulatory Visit: Payer: Self-pay

## 2021-09-16 ENCOUNTER — Emergency Department (HOSPITAL_COMMUNITY): Payer: PPO

## 2021-09-16 ENCOUNTER — Emergency Department (HOSPITAL_COMMUNITY)
Admission: EM | Admit: 2021-09-16 | Discharge: 2021-09-16 | Disposition: A | Discharge status: Home / Self Care | Payer: PPO | Attending: Emergency Medicine | Admitting: Emergency Medicine

## 2021-09-16 DIAGNOSIS — W01198A Fall on same level from slipping, tripping and stumbling with subsequent striking against other object, initial encounter: Secondary | ICD-10-CM | POA: Diagnosis not present

## 2021-09-16 DIAGNOSIS — J323 Chronic sphenoidal sinusitis: Secondary | ICD-10-CM | POA: Diagnosis not present

## 2021-09-16 DIAGNOSIS — J984 Other disorders of lung: Secondary | ICD-10-CM | POA: Diagnosis not present

## 2021-09-16 DIAGNOSIS — S0990XA Unspecified injury of head, initial encounter: Secondary | ICD-10-CM | POA: Diagnosis not present

## 2021-09-16 DIAGNOSIS — W19XXXA Unspecified fall, initial encounter: Secondary | ICD-10-CM | POA: Diagnosis not present

## 2021-09-16 DIAGNOSIS — I10 Essential (primary) hypertension: Secondary | ICD-10-CM | POA: Insufficient documentation

## 2021-09-16 DIAGNOSIS — S0101XA Laceration without foreign body of scalp, initial encounter: Secondary | ICD-10-CM | POA: Insufficient documentation

## 2021-09-16 DIAGNOSIS — M542 Cervicalgia: Secondary | ICD-10-CM | POA: Diagnosis not present

## 2021-09-16 DIAGNOSIS — S20211A Contusion of right front wall of thorax, initial encounter: Secondary | ICD-10-CM | POA: Diagnosis not present

## 2021-09-16 DIAGNOSIS — J320 Chronic maxillary sinusitis: Secondary | ICD-10-CM | POA: Diagnosis not present

## 2021-09-16 DIAGNOSIS — M47812 Spondylosis without myelopathy or radiculopathy, cervical region: Secondary | ICD-10-CM | POA: Diagnosis not present

## 2021-09-16 DIAGNOSIS — R9082 White matter disease, unspecified: Secondary | ICD-10-CM | POA: Diagnosis not present

## 2021-09-16 DIAGNOSIS — M4312 Spondylolisthesis, cervical region: Secondary | ICD-10-CM | POA: Diagnosis not present

## 2021-09-16 DIAGNOSIS — R0781 Pleurodynia: Secondary | ICD-10-CM | POA: Diagnosis not present

## 2021-09-16 DIAGNOSIS — I671 Cerebral aneurysm, nonruptured: Secondary | ICD-10-CM | POA: Diagnosis not present

## 2021-09-16 DIAGNOSIS — R58 Hemorrhage, not elsewhere classified: Secondary | ICD-10-CM | POA: Diagnosis not present

## 2021-09-16 MED ORDER — ACETAMINOPHEN 500 MG PO TABS
1000.0000 mg | ORAL_TABLET | Freq: Once | ORAL | Status: AC
  Administered 2021-09-16: 05:00:00 1000 mg via ORAL
  Filled 2021-09-16: qty 2

## 2021-09-16 NOTE — ED Provider Notes (Addendum)
[place] DEPT Provider Note   CSN: [id number] Arrival date & time: 09/16/21  0447     History Chief Complaint  Patient presents with   Fall   Head Laceration    Elaine Rivera is a 75 y.o. female.  Patient is a 75 year old female with a history of hypertension, seizure disorder, prior CVA and cerebral aneurysm status post coiling who is presenting today after a fall.  She reports that she has recently started using a walker because she had a fall approximately 1 month ago and has been at rehab.  Today she was getting up and started to use her walker when she thinks she must of lost lost her balance.  She fell backwards hitting her head on the floor.  She remembers the fall but thinks she may have blacked out for a brief time after the fall.  She is complaining of a headache currently but denies any dizziness, chest pain, palpitations, visual changes, numbness, weakness or tingling in her upper or lower extremities.  She is also complaining of some mild pain in her neck and pain in her right ribs with movement.  The history is provided by the patient.  Fall This is a new problem. The current episode started less than 1 hour ago. The problem occurs constantly. The problem has not changed since onset.Associated symptoms include headaches. Associated symptoms comments: Pain in the right ribs..  Head Laceration Associated symptoms include headaches.      Past Medical History:  Diagnosis Date   Anemia    years ago   Arthritis    Basal cell carcinoma of eye    biopsy of left eye/ non cancerous   Complication of anesthesia    nausea/vomiting   Depression    GERD (gastroesophageal reflux disease)    History of kidney stones    Hypertension    no longer on medications    Patient Active Problem List   Diagnosis Date Noted   Protein-calorie malnutrition, severe 08/26/2021   Failure to thrive in adult 08/23/2021   Right 10th rib fracture  08/23/2021   Physical deconditioning 08/23/2021   Bad headache 03/14/2021   Seizure ([place]) 03/06/2021   CVA (cerebral vascular accident) ([place]) 03/06/2021   Ischemic cerebrovascular accident (CVA) ([place]) 03/05/2021   Hypokalemia 03/05/2021   Anxiety 03/05/2021   Transient speech disturbance 03/05/2021   Lung nodule 03/05/2021   Aphasia    Fall    Anemia 02/28/2020   Shortness of breath 01/24/2020   Dizziness 01/24/2020   Herpes 10/04/2018   Cerebral aneurysm 09/05/2018   NSAID long-term use 04/20/2016   Scoliosis 04/20/2016   Depression 04/20/2016   HTN (hypertension) 04/20/2016   GERD (gastroesophageal reflux disease) 04/20/2016   Occult GI bleeding 04/20/2016    Past Surgical History:  Procedure Laterality Date   CEREBRAL ANEURYSM REPAIR     1998   COLONOSCOPY     ECTOPIC PREGNANCY SURGERY     x2    ENTEROSCOPY N/A 03/01/2020   Procedure: ENTEROSCOPY;  Surgeon: Carol Ada, MD;  Location: [facility];  Service: Endoscopy;  Laterality: N/A;   ESOPHAGOGASTRODUODENOSCOPY N/A 04/22/2016   Procedure: ESOPHAGOGASTRODUODENOSCOPY (EGD);  Surgeon: Juanita Craver, MD;  Location: [facility];  Service: Endoscopy;  Laterality: N/A;   IR 3D INDEPENDENT WKST  09/05/2018   IR ANGIO INTRA EXTRACRAN SEL COM CAROTID INNOMINATE BILAT MOD SED  09/05/2018   IR ANGIO VERTEBRAL SEL VERTEBRAL UNI R MOD SED  09/05/2018   IR ANGIOGRAM FOLLOW  UP STUDY  09/05/2018   IR RADIOLOGIST EVAL & MGMT  03/28/2021   IR TRANSCATH/EMBOLIZ  09/05/2018   PARATHYROIDECTOMY     2002   RADIOLOGY WITH ANESTHESIA N/A 09/05/2018   Procedure: EMBOLIZATION;  Surgeon: Radiologist, Medication, MD;  Location: [facility];  Service: Radiology;  Laterality: N/A;   TONSILLECTOMY     1953   VAGINAL HYSTERECTOMY     2001   VOCAL CORD LATERALIZATION, ENDOSCOPIC APPROACH W/ MLB     2007 (@[facility] )   WRIST SURGERY     left side/2008   WRIST SURGERY     right side/ 2002     OB History   No obstetric history on file.     Family History   Family history unknown: Yes    Social History   Tobacco Use   Smoking status: Never   Smokeless tobacco: Never  Vaping Use   Vaping Use: Never used  Substance Use Topics   Alcohol use: No   Drug use: Never    Home Medications Prior to Admission medications   Medication Sig Start Date End Date Taking? Authorizing Provider  levETIRAcetam (KEPPRA) 500 MG tablet Take 1 tablet (500 mg total) by mouth 2 (two) times daily. 08/26/21   Shawna Clamp, MD  mirtazapine (REMERON) 7.5 MG tablet Take 1 tablet (7.5 mg total) by mouth at bedtime. 08/26/21   Shawna Clamp, MD    Allergies    Sulfa antibiotics  Review of Systems   Review of Systems  Neurological:  Positive for headaches.  All other systems reviewed and are negative.  Physical Exam Updated Vital Signs BP (!) 148/93    Pulse 68    Temp 97.7 F (36.5 C) (Oral)    Resp 18    Ht 4\' 11"  (1.499 m)    Wt 40 kg    SpO2 99%    BMI 17.81 kg/m   Physical Exam Vitals and nursing note reviewed.  Constitutional:      General: She is not in acute distress.    Appearance: Normal appearance. She is well-developed.  HENT:     Head: Normocephalic and atraumatic.   Eyes:     Pupils: Pupils are equal, round, and reactive to light.  Cardiovascular:     Rate and Rhythm: Normal rate and regular rhythm.     Heart sounds: Normal heart sounds. No murmur heard.   No friction rub.  Pulmonary:     Effort: Pulmonary effort is normal.     Breath sounds: Normal breath sounds. No wheezing or rales.     Comments: Tenderness with palpation of the right lateral ribs Chest:     Chest wall: Tenderness present.  Abdominal:     General: Bowel sounds are normal. There is no distension.     Palpations: Abdomen is soft.     Tenderness: There is no abdominal tenderness. There is no guarding or rebound.  Musculoskeletal:        General: No tenderness. Normal range of motion.     Cervical back: Normal range of motion and neck supple. Muscular  tenderness present. No spinous process tenderness.     Right lower leg: No edema.     Left lower leg: No edema.     Comments: No edema.  No thoracic or lumbar tenderness  Skin:    General: Skin is warm and dry.     Findings: No rash.  Neurological:     Mental Status: She is alert and oriented to person, place,  and time. Mental status is at baseline.     Cranial Nerves: No cranial nerve deficit.  Psychiatric:        Mood and Affect: Mood normal.        Behavior: Behavior normal.    ED Results / Procedures / Treatments   Labs (all labs ordered are listed, but only abnormal results are displayed) Labs Reviewed - No data to display  EKG None  Radiology DG Ribs Unilateral W/Chest Right  Result Date: 09/16/2021 CLINICAL DATA:  75 year old female status post fall. Posterior right rib pain. EXAM: RIGHT RIBS AND CHEST - 3+ VIEW COMPARISON:  Chest radiographs 08/22/2021 and earlier. FINDINGS: Semi upright AP view of the chest at 0615 hours. Chronic moderate to severe dextroconvex thoracic scoliosis. Stable lung volumes. Stable cardiac size and mediastinal contours. No pneumothorax, pulmonary edema or acute pulmonary opacity. Chronic hypo ventilation at the left lung base. Chronic surgical clips along the left trachea. Tracheal air column appears stable. Extensive left nephrolithiasis visible below the diaphragm. Chronic right posterior right 8th through 12th rib fractures, demonstrated by CT in June this year. No superimposed acute rib fracture identified. Other visible osseous structures appear intact. IMPRESSION: 1. Chronic right posterior 8th through 12th rib fractures. No acute rib fracture identified. 2. No acute cardiopulmonary abnormality. 3. Advanced scoliosis, left nephrolithiasis. Electronically Signed   By: Genevie Ann M.D.   On: 09/16/2021 06:32   CT Head Wo Contrast  Result Date: 09/16/2021 CLINICAL DATA:  Unwitnessed fall. EXAM: CT HEAD WITHOUT CONTRAST CT CERVICAL SPINE WITHOUT  CONTRAST TECHNIQUE: Multidetector CT imaging of the head and cervical spine was performed following the standard protocol without intravenous contrast. Multiplanar CT image reconstructions of the cervical spine were also generated. COMPARISON:  08/22/2021 FINDINGS: CT HEAD FINDINGS Brain: There is no evidence for acute hemorrhage, hydrocephalus, mass lesion, or abnormal extra-axial fluid collection. No definite CT evidence for acute infarction. Diffuse loss of parenchymal volume is consistent with atrophy. Patchy low attenuation in the deep hemispheric and periventricular white matter is nonspecific, but likely reflects chronic microvascular ischemic demyelination. Prominence of the lateral ventricles bilaterally likely reflect central atrophy although hydrocephalus not excluded. 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 Lesion has been previously described as partially thrombosed. Right vertebrobasilar vascular stent device again noted. Vascular: No new hyperdense vessel or unexpected calcification. See above. Skull: No evidence for fracture. No worrisome lytic or sclerotic lesion. Right suboccipital craniotomy defect again noted. Sinuses/Orbits: Mild chronic polypoid mucosal disease noted left maxillary sinus with mucosal thickening noted right sphenoid sinus. Mastoid air cells are clear bilaterally. Visualized portions of the globes and intraorbital fat are unremarkable. Other: None. CT CERVICAL SPINE FINDINGS Alignment: Trace retrolisthesis of C3 on 4 is stable. Trace anterolisthesis of C5 on 6 is stable. Skull base and vertebrae: No acute fracture. No primary bone lesion or focal pathologic process. Soft tissues and spinal canal: No prevertebral fluid or swelling. No visible canal hematoma. Disc levels: Marked loss of disc height noted at all levels from C4-5 down to C7-T1. Upper chest: Stable pleuroparenchymal scarring in the right lung apex.  Other: None. IMPRESSION: 1. No acute intracranial abnormality. Stable atrophy with chronic small vessel white matter ischemic disease. 2. Stable appearance of the known right PICA aneurysm. 3. Stable multilevel degenerative changes in the cervical spine without fracture. Electronically Signed   By: Misty Stanley M.D.   On: 09/16/2021 06:41   CT Cervical Spine Wo  Contrast  Result Date: 09/16/2021 CLINICAL DATA:  Unwitnessed fall. EXAM: CT HEAD WITHOUT CONTRAST CT CERVICAL SPINE WITHOUT CONTRAST TECHNIQUE: Multidetector CT imaging of the head and cervical spine was performed following the standard protocol without intravenous contrast. Multiplanar CT image reconstructions of the cervical spine were also generated. COMPARISON:  08/22/2021 FINDINGS: CT HEAD FINDINGS Brain: There is no evidence for acute hemorrhage, hydrocephalus, mass lesion, or abnormal extra-axial fluid collection. No definite CT evidence for acute infarction. Diffuse loss of parenchymal volume is consistent with atrophy. Patchy low attenuation in the deep hemispheric and periventricular white matter is nonspecific, but likely reflects chronic microvascular ischemic demyelination. Prominence of the lateral ventricles bilaterally likely reflect central atrophy although hydrocephalus not excluded. 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 Lesion has been previously described as partially thrombosed. Right vertebrobasilar vascular stent device again noted. Vascular: No new hyperdense vessel or unexpected calcification. See above. Skull: No evidence for fracture. No worrisome lytic or sclerotic lesion. Right suboccipital craniotomy defect again noted. Sinuses/Orbits: Mild chronic polypoid mucosal disease noted left maxillary sinus with mucosal thickening noted right sphenoid sinus. Mastoid air cells are clear bilaterally. Visualized portions of the globes and intraorbital fat are  unremarkable. Other: None. CT CERVICAL SPINE FINDINGS Alignment: Trace retrolisthesis of C3 on 4 is stable. Trace anterolisthesis of C5 on 6 is stable. Skull base and vertebrae: No acute fracture. No primary bone lesion or focal pathologic process. Soft tissues and spinal canal: No prevertebral fluid or swelling. No visible canal hematoma. Disc levels: Marked loss of disc height noted at all levels from C4-5 down to C7-T1. Upper chest: Stable pleuroparenchymal scarring in the right lung apex. Other: None. IMPRESSION: 1. No acute intracranial abnormality. Stable atrophy with chronic small vessel white matter ischemic disease. 2. Stable appearance of the known right PICA aneurysm. 3. Stable multilevel degenerative changes in the cervical spine without fracture. Electronically Signed   By: Misty Stanley M.D.   On: 09/16/2021 06:41    Procedures Procedures  LACERATION REPAIR Performed by: [employer] Authorized by: Blanchie Dessert Consent: Verbal consent obtained. Risks and benefits: risks, benefits and alternatives were discussed Consent given by: patient Patient identity confirmed: provided demographic data Prepped and Draped in normal sterile fashion Wound explored  Laceration Location: scalp  Laceration Length: 4cm  No Foreign Bodies seen or palpated  Anesthesia: local infiltration  Local anesthetic: lidocaine 2% with epinephrine  Anesthetic total: 5 ml  Irrigation method: syringe Amount of cleaning: standard  Skin closure: staples  Number of sutures: 4   Patient tolerance: Patient tolerated the procedure well with no immediate complications.   Medications Ordered in ED Medications  acetaminophen (TYLENOL) tablet 1,000 mg (1,000 mg Oral Given 09/16/21 0522)    ED Course  I have reviewed the triage vital signs and the nursing notes.  Pertinent labs & imaging results that were available during my care of the patient were reviewed by me and considered in my medical  decision making (see chart for details).    MDM Rules/Calculators/A&P                         Patient is an elderly female with multiple medical problems presenting today after a fall.  She was trying to use her walker and reports losing balance and falling backwards.  Laceration noted to the left occipital area.  Patient is also having mild tenderness in her neck and  significant pain in her right lateral ribs.  Breath sounds are equal bilaterally and she denies any shortness of breath.  Low suspicion for syncope as the cause for her fall.  She does not take any anticoagulation but does have prior history of cerebral aneurysm and CVAs.  Head and neck imaging are pending.  Currently neurovascularly intact.  Rib films are also pending and patient given Tylenol.  6:50 AM Imaging is neg for acute abnormalities.  Wound repaired and pt d/ced home.   MDM   Amount and/or Complexity of Data Reviewed Tests in the radiology section of CPT: ordered and reviewed Independent visualization of images, tracings, or specimens: yes     Final Clinical Impression(s) / ED Diagnoses Final diagnoses:  Laceration of scalp, initial encounter  Fall, initial encounter  Rib contusion, right, initial encounter    Rx / DC Orders ED Discharge Orders     None        Blanchie Dessert, MD 09/16/21 1164    Blanchie Dessert, MD 09/16/21 [phone]

## 2021-09-16 NOTE — ED Notes (Signed)
PTAR called for transport services.

## 2021-09-16 NOTE — Discharge Instructions (Signed)
The CAT scans were ok without signs of injury.  No rib fractures today but bad bruises.  You are going to be very sore over the next 2-3 days.  If you start having repeated falls, fever, vomiting or confusion return to the ER.

## 2021-09-16 NOTE — ED Triage Notes (Signed)
Pt arrived via EMS from home. Pt had an unwitnessed fall at [facility] and has a lac to the back of her head. Pt has controlled bleeding and denies LOC or dizziness. Pt is A&Ox4 with no hx of dementia.

## 2021-09-17 DIAGNOSIS — S20219A Contusion of unspecified front wall of thorax, initial encounter: Secondary | ICD-10-CM | POA: Diagnosis not present

## 2021-09-17 DIAGNOSIS — W19XXXA Unspecified fall, initial encounter: Secondary | ICD-10-CM | POA: Diagnosis not present

## 2021-09-17 DIAGNOSIS — S0101XA Laceration without foreign body of scalp, initial encounter: Secondary | ICD-10-CM | POA: Diagnosis not present

## 2021-09-17 DIAGNOSIS — S0093XA Contusion of unspecified part of head, initial encounter: Secondary | ICD-10-CM | POA: Diagnosis not present

## 2021-09-25 DIAGNOSIS — G40909 Epilepsy, unspecified, not intractable, without status epilepticus: Secondary | ICD-10-CM | POA: Diagnosis not present

## 2021-09-25 DIAGNOSIS — G8929 Other chronic pain: Secondary | ICD-10-CM | POA: Diagnosis not present

## 2021-09-25 DIAGNOSIS — R627 Adult failure to thrive: Secondary | ICD-10-CM | POA: Diagnosis not present

## 2021-09-25 DIAGNOSIS — I679 Cerebrovascular disease, unspecified: Secondary | ICD-10-CM | POA: Diagnosis not present

## 2021-10-26 ENCOUNTER — Emergency Department (HOSPITAL_COMMUNITY): Payer: Medicare PPO

## 2021-10-26 ENCOUNTER — Other Ambulatory Visit: Payer: Self-pay

## 2021-10-26 ENCOUNTER — Observation Stay (HOSPITAL_COMMUNITY)
Admission: EM | Admit: 2021-10-26 | Discharge: 2021-10-27 | Disposition: A | Discharge status: Home / Self Care | Payer: Medicare PPO | Attending: Internal Medicine | Admitting: Internal Medicine

## 2021-10-26 DIAGNOSIS — E876 Hypokalemia: Secondary | ICD-10-CM | POA: Diagnosis not present

## 2021-10-26 DIAGNOSIS — Z20822 Contact with and (suspected) exposure to covid-19: Secondary | ICD-10-CM | POA: Insufficient documentation

## 2021-10-26 DIAGNOSIS — R4701 Aphasia: Secondary | ICD-10-CM | POA: Diagnosis present

## 2021-10-26 DIAGNOSIS — I10 Essential (primary) hypertension: Secondary | ICD-10-CM | POA: Diagnosis not present

## 2021-10-26 DIAGNOSIS — G40909 Epilepsy, unspecified, not intractable, without status epilepticus: Secondary | ICD-10-CM | POA: Diagnosis not present

## 2021-10-26 HISTORY — DX: Unspecified convulsions: R56.9

## 2021-10-26 LAB — URINALYSIS, ROUTINE W REFLEX MICROSCOPIC
Bacteria, UA: NONE SEEN
Bilirubin Urine: NEGATIVE
Glucose, UA: NEGATIVE mg/dL
Hgb urine dipstick: NEGATIVE
Ketones, ur: NEGATIVE mg/dL
Nitrite: NEGATIVE
Protein, ur: NEGATIVE mg/dL
Specific Gravity, Urine: 1.01 (ref 1.005–1.030)
pH: 7 (ref 5.0–8.0)

## 2021-10-26 LAB — CBC WITH DIFFERENTIAL/PLATELET
Abs Immature Granulocytes: 0.01 10*3/uL (ref 0.00–0.07)
Basophils Absolute: 0 10*3/uL (ref 0.0–0.1)
Basophils Relative: 0 %
Eosinophils Absolute: 0.1 10*3/uL (ref 0.0–0.5)
Eosinophils Relative: 1 %
HCT: 36.8 % (ref 36.0–46.0)
Hemoglobin: 11.4 g/dL — ABNORMAL LOW (ref 12.0–15.0)
Immature Granulocytes: 0 %
Lymphocytes Relative: 20 %
Lymphs Abs: 1.4 10*3/uL (ref 0.7–4.0)
MCH: 28.9 pg (ref 26.0–34.0)
MCHC: 31 g/dL (ref 30.0–36.0)
MCV: 93.4 fL (ref 80.0–100.0)
Monocytes Absolute: 0.7 10*3/uL (ref 0.1–1.0)
Monocytes Relative: 10 %
Neutro Abs: 4.7 10*3/uL (ref 1.7–7.7)
Neutrophils Relative %: 69 %
Platelets: 292 10*3/uL (ref 150–400)
RBC: 3.94 MIL/uL (ref 3.87–5.11)
RDW: 14.6 % (ref 11.5–15.5)
WBC: 6.9 10*3/uL (ref 4.0–10.5)
nRBC: 0 % (ref 0.0–0.2)

## 2021-10-26 LAB — BASIC METABOLIC PANEL
Anion gap: 9 (ref 5–15)
BUN: 10 mg/dL (ref 8–23)
CO2: 26 mmol/L (ref 22–32)
Calcium: 9.3 mg/dL (ref 8.9–10.3)
Chloride: 105 mmol/L (ref 98–111)
Creatinine, Ser: 0.58 mg/dL (ref 0.44–1.00)
GFR, Estimated: >60 mL/min (ref >60)
Glucose, Bld: 86 mg/dL (ref 70–99)
Potassium: 3.2 mmol/L — ABNORMAL LOW (ref 3.5–5.1)
Sodium: 140 mmol/L (ref 135–145)

## 2021-10-26 LAB — MAGNESIUM: Magnesium: 2 mg/dL (ref 1.7–2.4)

## 2021-10-26 LAB — RESP PANEL BY RT-PCR (FLU A&B, COVID) ARPGX2
Influenza A by PCR: NEGATIVE
Influenza B by PCR: NEGATIVE
SARS Coronavirus 2 by RT PCR: NEGATIVE

## 2021-10-26 LAB — CBG MONITORING, ED: Glucose-Capillary: 91 mg/dL (ref 70–99)

## 2021-10-26 MED ORDER — LEVETIRACETAM 750 MG PO TABS: 750.0000 mg | ORAL_TABLET | Freq: Two times a day (BID) | ORAL | 0 refills | Status: DC

## 2021-10-26 MED ORDER — LEVETIRACETAM 500 MG PO TABS
1000.0000 mg | ORAL_TABLET | Freq: Once | ORAL | Status: AC
  Administered 2021-10-26: 1000 mg via ORAL
  Filled 2021-10-26: qty 2

## 2021-10-26 MED ORDER — ACETAMINOPHEN 500 MG PO TABS
1000.0000 mg | ORAL_TABLET | Freq: Once | ORAL | Status: AC
  Administered 2021-10-27: 1000 mg via ORAL
  Filled 2021-10-26: qty 2

## 2021-10-26 MED ORDER — ACETAMINOPHEN 325 MG PO TABS: 650.0000 mg | ORAL_TABLET | Freq: Once | ORAL | Status: DC

## 2021-10-26 NOTE — ED Triage Notes (Signed)
Pt to ED via EMS from [place] ([address] [place] [address]) NT was at pt bedside ordering dinner, and witnessed sudden onset asphagia and arms tremors, NT went to report to RN, When back to bedside aproz 2-3 mins, it has resolved. Pt currently voicing no complaints. Hx Seizure with TIA x1 aprox 3 years ago, Pt voicing no complaints. Pt A&OX4. Last VS: 172/90, P 79, 97%ra, cbg 95. No medications given by EMS, hx:HTN, Anxiety.

## 2021-10-26 NOTE — Discharge Instructions (Addendum)
Today you were given your nighttime dose of Keppra. I have given you a prescription for an increased dose of your Keppra.  Please increase your dose to 750 mg of Keppra twice a day instead of 500 mg twice a day. Please follow-up with your primary care doctor and your neurologist.

## 2021-10-26 NOTE — ED Provider Notes (Signed)
[facility] EMERGENCY DEPARTMENT Provider Note   CSN: [id number] Arrival date & time: 10/26/21  1638     History  Chief Complaint  Patient presents with   Seizure vs. TIA    Elaine Rivera is a 76 y.o. female.  He is brought in by EMS from her care facility for an episode of word finding difficulty.  She has a history of seizure and also intracranial aneurysm and stenting.  She is on Keppra.  She said she was upset about having a new roommate.  She was giving her order for dinner to the tech when she experienced 2 to 3 minutes of some word finding difficulty and some increased tremor on her right arm.  By the time EMS got there she was completely back to normal.  She does endorse having a headache.  She said she had a similar episode in the past that they diagnosed as seizure.  She denies any recent falls.  The history is provided by the patient and the EMS personnel.  Cerebrovascular Accident This is a recurrent problem. The current episode started less than 1 hour ago. The problem has been resolved. Associated symptoms include headaches. Pertinent negatives include no chest pain, no abdominal pain and no shortness of breath. Nothing aggravates the symptoms. Nothing relieves the symptoms. She has tried nothing for the symptoms. The treatment provided significant relief.      Home Medications Prior to Admission medications   Medication Sig Start Date End Date Taking? Authorizing Provider  levETIRAcetam (KEPPRA) 500 MG tablet Take 1 tablet (500 mg total) by mouth 2 (two) times daily. 08/26/21   Shawna Clamp, MD  mirtazapine (REMERON) 7.5 MG tablet Take 1 tablet (7.5 mg total) by mouth at bedtime. 08/26/21   Shawna Clamp, MD      Allergies    Sulfa antibiotics    Review of Systems   Review of Systems  Constitutional:  Negative for fever.  HENT:  Negative for sore throat.   Eyes:  Negative for visual disturbance.  Respiratory:  Negative for shortness of  breath.   Cardiovascular:  Negative for chest pain.  Gastrointestinal:  Negative for abdominal pain.  Genitourinary:  Negative for dysuria.  Musculoskeletal:  Negative for neck pain.  Skin:  Negative for rash.  Neurological:  Positive for tremors, speech difficulty and headaches.   Physical Exam Updated Vital Signs BP (!) 139/100 (BP Location: Right Arm)    Pulse 77    Temp 98.4 F (36.9 C) (Oral)    Resp 20    Ht 4\' 11"  (1.499 m)    Wt 39.5 kg    SpO2 97%    BMI 17.57 kg/m  Physical Exam Vitals and nursing note reviewed.  Constitutional:      General: She is not in acute distress.    Appearance: Normal appearance. She is well-developed.  HENT:     Head: Normocephalic and atraumatic.  Eyes:     Conjunctiva/sclera: Conjunctivae normal.  Cardiovascular:     Rate and Rhythm: Normal rate and regular rhythm.     Heart sounds: No murmur heard. Pulmonary:     Effort: Pulmonary effort is normal. No respiratory distress.     Breath sounds: Normal breath sounds.  Abdominal:     Palpations: Abdomen is soft.     Tenderness: There is no abdominal tenderness. There is no guarding or rebound.  Musculoskeletal:        General: No swelling.     Cervical back:  Neck supple.  Skin:    General: Skin is warm and dry.     Capillary Refill: Capillary refill takes less than 2 seconds.  Neurological:     Mental Status: She is alert and oriented to person, place, and time.     Cranial Nerves: No cranial nerve deficit.     Sensory: No sensory deficit.     Motor: No weakness.     Comments: Has a baseline tremor of her right upper extremity.  Psychiatric:        Mood and Affect: Mood normal.    ED Results / Procedures / Treatments   Labs (all labs ordered are listed, but only abnormal results are displayed) Labs Reviewed  BASIC METABOLIC PANEL - Abnormal; Notable for the following components:      Result Value   Potassium 3.2 (*)    All other components within normal limits  CBC WITH  DIFFERENTIAL/PLATELET - Abnormal; Notable for the following components:   Hemoglobin 11.4 (*)    All other components within normal limits  URINALYSIS, ROUTINE W REFLEX MICROSCOPIC - Abnormal; Notable for the following components:   Color, Urine STRAW (*)    APPearance HAZY (*)    Leukocytes,Ua TRACE (*)    All other components within normal limits  RESP PANEL BY RT-PCR (FLU A&B, COVID) ARPGX2  MAGNESIUM  CBG MONITORING, ED    EKG EKG Interpretation  Date/Time:  Sunday October 26 2021 16:52:19 EST Ventricular Rate:  79 PR Interval:  156 QRS Duration: 70 QT Interval:  414 QTC Calculation: 475 R Axis:   -62 Text Interpretation: Sinus rhythm Left anterior fascicular block Borderline repolarization abnormality No significant change since prior 11/22 Confirmed by Aletta Edouard [phone]) on 10/26/2021 5:02:52 PM  Radiology No results found.  Procedures Procedures    Medications Ordered in ED Medications - No data to display  ED Course/ Medical Decision Making/ A&P Clinical Course as of 10/26/21 2136  Nancy Fetter Oct 26, 2021  1708 Discussed with neurology Dr. Rory Percy.  He is recommending an MRI brain.  He asked to be called once the MRI is completed to determine if the patient needs admission or increase of her Keppra. [MB]    Clinical Course User Index [MB] Hayden Rasmussen, MD                           Medical Decision Making Amount and/or Complexity of Data Reviewed Labs: ordered. Radiology: ordered.  This patient complains of word finding difficulty and increased tremor right arm, generalized headache; this involves an extensive number of treatment Options and is a complaint that carries with it a high risk of complications and Morbidity. The differential includes complicated migraine, stroke, bleed, seizure, metabolic derangement,  I ordered, reviewed and interpreted labs, which included CBC with normal white count, hemoglobin, chemistries with potassium, COVID urinalysis  without clear signs of infection I ordered imaging studies which included MRI brain and this is pending at time of signout Additional history obtained from EMS, they thought the patient might chronic activity Previous records obtained and reviewed in epic including prior admission for similar presentation where she was found to have epileptogenic activity on EEG I consulted neurology [place] And discussed lab and imaging findings.  He is recommending MRI brain and reconsultation once obtained  After the interventions stated above, I reevaluated the patient and found patient to be fairly asymptomatic at this time.  Still has some baseline tremor right arm.  Her care is signed out to Clancy to follow-up on results of MRI.  Disposition per recommendations of neurology after MRI done.          Final Clinical Impression(s) / ED Diagnoses Final diagnoses:  Aphasia    Rx / DC Orders ED Discharge Orders     None         Hayden Rasmussen, MD 10/26/21 2140

## 2021-10-26 NOTE — ED Notes (Signed)
Provider at bedside at this time

## 2021-10-26 NOTE — ED Notes (Signed)
Pt transported to MRI 

## 2021-10-26 NOTE — ED Notes (Signed)
Pt brought to room 40 at this time

## 2021-10-26 NOTE — ED Provider Notes (Addendum)
I assumed care of patient at shift change from previous team, please see their note for full H&P Briefly patient is here for evaluation of a brief episode of aphasia, concern for seizure versus TIA.     Clinical Course as of 10/26/21 7106  Nancy Fetter Oct 26, 2021  1708 Discussed with neurology Dr. Rory Percy.  He is recommending an MRI brain.  He asked to be called once the MRI is completed to determine if the patient needs admission or increase of her Keppra. [MB]  2216 Dr. Leonel Ramsay reccomends Giving her a dose of 1 g keppra, increasing her dose to 750 BID and discharge.  [EH]    Clinical Course User Index [EH] Lorin Glass, PA-C [MB] Hayden Rasmussen, MD   Medical Decision Making Amount and/or Complexity of Data Reviewed Labs: ordered. Radiology: ordered.  Risk OTC drugs. Prescription drug management.   Plan to follow up on MRI, the other work up has already been compleated, consult neurology when it results. Admit vs increase Keppra dosing.  MRI resulted. I spoke with Dr. Leonel Ramsay who recommends giving patient 1 g of Keppra here and increasing her dose to 750 mg twice daily and discharge. I discussed this plan with patient.  She is in agreement.  She requested her nighttime dose of Tylenol which was ordered. She is given a prescription for the increased Keppra dosing, instructed to follow-up with her outpatient care team.  Return precautions were discussed with patient who states their understanding.  At the time of discharge patient denied any unaddressed complaints or concerns.  Patient is agreeable for discharge home.  Note: Portions of this report may have been transcribed using voice recognition software. Every effort was made to ensure accuracy; however, inadvertent computerized transcription errors may be present     Lorin Glass, PA-C 10/26/21 2349   After I discharge patient I received a phone call from patient's RN stating that patient reported that  she was having ongoing episodes of aphasia. When I evaluated patient she states "I had a seizure."  She denies any twitching.  She is unable to tell me how long this lasted for.  She is not having aphasia at the time of my evaluation. I spoke with Dr. Leonel Ramsay who recommended hospitalist admission.  I spoke with hospitalist who will see patient for admission.  The patient appears reasonably stabilized for admission considering the current resources, flow, and capabilities available in the ED at this time, and I doubt any other [facility] requiring further screening and/or treatment in the ED prior to admission assuming timely admission and bed placement.  Note: Portions of this report may have been transcribed using voice recognition software. Every effort was made to ensure accuracy; however, inadvertent computerized transcription errors may be present    Lorin Glass, PA-C 10/27/21 0058    Hayden Rasmussen, MD 10/27/21 [phone]

## 2021-10-27 ENCOUNTER — Encounter (HOSPITAL_COMMUNITY): Payer: Self-pay | Admitting: Internal Medicine

## 2021-10-27 DIAGNOSIS — G40909 Epilepsy, unspecified, not intractable, without status epilepticus: Secondary | ICD-10-CM

## 2021-10-27 DIAGNOSIS — E876 Hypokalemia: Secondary | ICD-10-CM | POA: Diagnosis not present

## 2021-10-27 DIAGNOSIS — R4701 Aphasia: Secondary | ICD-10-CM

## 2021-10-27 MED ORDER — ACETAMINOPHEN 325 MG PO TABS: 650.0000 mg | ORAL_TABLET | Freq: Four times a day (QID) | ORAL | Status: DC | PRN

## 2021-10-27 MED ORDER — LORAZEPAM 2 MG/ML IJ SOLN: 1.0000 mg | INTRAMUSCULAR | Status: DC | PRN

## 2021-10-27 MED ORDER — LORAZEPAM 2 MG/ML IJ SOLN: 1.0000 mg | Freq: Once | INTRAMUSCULAR | Status: DC

## 2021-10-27 MED ORDER — ACETAMINOPHEN 650 MG RE SUPP: 650.0000 mg | Freq: Four times a day (QID) | RECTAL | Status: DC | PRN

## 2021-10-27 NOTE — ED Notes (Signed)
[facility] called to cancel transportation request for patient.

## 2021-10-27 NOTE — ED Notes (Signed)
[facility] called to transport patient to [facility].

## 2021-10-27 NOTE — ED Notes (Signed)
A gentleman showed up to transport the pt home. Pt readvised that she would be leaving AMA and pt verbalized understanding

## 2021-10-27 NOTE — H&P (Signed)
History and Physical    PLEASE NOTE THAT DRAGON DICTATION SOFTWARE WAS USED IN THE CONSTRUCTION OF THIS NOTE.   Elaine Rivera IDP:[id number] DOB: 10/13/1945 DOA: 10/26/2021  PCP: System, Provider Not In (will further assess) Patient coming from: ALF  I have personally briefly reviewed patient's old medical records in [place]  Chief Complaint: aphasia  HPI: Elaine Rivera is a 76 y.o. female with medical history significant for seizures manifested via aphasia who is admitted to [facility] on 10/26/2021 with aphasia after presenting from ALF to [facility] ED complaining of aphasia.   The patient presents complaining of an episode of aphasia experienced at her assisted living facility earlier in the day.  This was followed by an additional episode of aphasia which was unwitnessed, occurring in the emergency department today.  Patient conveys that he has these episodes of aphasia lasted a few minutes in duration before complete resolution and were not associated with any slurring of speech.  She also denies any associated acute focal weakness, acute focal numbness, paresthesias, facial droop, acute change in vision, dysphagia, vertigo.  She denies any associated chest pain shortness of breath, palpitations, diaphoresis, nausea, vomiting, dizziness, presyncope, or syncope.  She also denies any recent subjective fever, chills, rigors, generalized myalgias.  Not associate any headache, neck stiffness.  Denies any recent dysuria, gross hematuria, or change in urinary urgency/frequency.  The patient has a documented history of seizures manifested in the episodes of aphasia.  She reportedly underwent most recent EEG in June 2022, at which time an episode of aphasia was identified as corresponding to epileptiform finding consistent with seizure.  Patient reports good compliance with her current outpatient antiepileptic regimen, which consists of Keppra 500 mg p.o. twice daily, denying any  recent missed doses thereof.    ED Course:  Vital signs in the ED were notable for the following: Afebrile; heart rate 63-81; blood pressure 117/82; respiratory rate 15-22, oxygen saturation 95 to 100% on room air.  Labs were notable for the following: BMP notable for the following: Potassium 3.2, bicarbonate 26, creatinine 0.58.  CBC notable for white cell count 6900.  Urinalysis notable for 6-10 white blood cells, nitrate negative.  COVID-19/influenza PCR [place] negative.  Imaging and additional notable ED work-up: EKG showed sinus rhythm with heart rate 79, normal intervals, no evidence of T wave or ST changes.  MRI brain without contrast showed no evidence of acute intracranial process, including no evidence of acute ischemic infarct.  The patient's case was discussed with the on-call neurologist, Dr.Kirkpatrick, Who will formally consult.  Dr. Leonel Ramsay feels that the presentation is consistent with her known history of aphasic episodes associated with underlying seizure disorder.  He recommends overnight observation to the hospitalist service for further evaluation and management of seizure disorder, noting the 2 reported episodes of aphasia over the last 24 hours.  He also recommends escalation of her current home dose of Keppra from 500 mg p.o. twice daily to 1000 mg p.o. twice daily.   While in the ED, the following were administered: Keppra 1 g p.o. x1.    Subsequently, the patient is admitted for overnight observation to med telemetry for further evaluation management of presenting multiple episodes of aphasia in the setting of underlying seizure disorder.     Review of Systems: As per HPI otherwise 10 point review of systems negative.   Past Medical History:  Diagnosis Date   Anemia    years ago   Arthritis  Basal cell carcinoma of eye    biopsy of left eye/ non cancerous   Complication of anesthesia    nausea/vomiting   Depression    GERD (gastroesophageal reflux  disease)    History of kidney stones    Hypertension    no longer on medications    Past Surgical History:  Procedure Laterality Date   CEREBRAL ANEURYSM REPAIR     1998   COLONOSCOPY     ECTOPIC PREGNANCY SURGERY     x2    ENTEROSCOPY N/A 03/01/2020   Procedure: ENTEROSCOPY;  Surgeon: Carol Ada, MD;  Location: [facility] ENDOSCOPY;  Service: Endoscopy;  Laterality: N/A;   ESOPHAGOGASTRODUODENOSCOPY N/A 04/22/2016   Procedure: ESOPHAGOGASTRODUODENOSCOPY (EGD);  Surgeon: Juanita Craver, MD;  Location: [facility] ENDOSCOPY;  Service: Endoscopy;  Laterality: N/A;   IR 3D INDEPENDENT WKST  09/05/2018   IR ANGIO INTRA EXTRACRAN SEL COM CAROTID INNOMINATE BILAT MOD SED  09/05/2018   IR ANGIO VERTEBRAL SEL VERTEBRAL UNI R MOD SED  09/05/2018   IR ANGIOGRAM FOLLOW UP STUDY  09/05/2018   IR RADIOLOGIST EVAL & MGMT  03/28/2021   IR TRANSCATH/EMBOLIZ  09/05/2018   PARATHYROIDECTOMY     2002   RADIOLOGY WITH ANESTHESIA N/A 09/05/2018   Procedure: EMBOLIZATION;  Surgeon: Radiologist, Medication, MD;  Location: [place];  Service: Radiology;  Laterality: N/A;   TONSILLECTOMY     1953   VAGINAL HYSTERECTOMY     2001   VOCAL CORD LATERALIZATION, ENDOSCOPIC APPROACH W/ MLB     2007 (@[facility] )   WRIST SURGERY     left side/2008   WRIST SURGERY     right side/ 2002    Social History:  reports that she has never smoked. She has never used smokeless tobacco. She reports that she does not drink alcohol and does not use drugs.   Allergies  Allergen Reactions   Sulfa Antibiotics Other (See Comments)    Reaction:  Unknown     Family History  Family history unknown: Yes    Family history reviewed and not pertinent    Prior to Admission medications   Medication Sig Start Date End Date Taking? Authorizing Provider  levETIRAcetam (KEPPRA) 750 MG tablet Take 1 tablet (750 mg total) by mouth 2 (two) times daily. 10/26/21   Lorin Glass, PA-C  mirtazapine (REMERON) 7.5 MG tablet Take 1 tablet (7.5 mg total) by  mouth at bedtime. 08/26/21   Shawna Clamp, MD     Objective    Physical Exam: Vitals:   10/26/21 2303 10/27/21 0000 10/27/21 0100 10/27/21 0200  BP:  135/80 129/77 (!) 155/88  Pulse:  67 64 67  Resp:  (!) 22 (!) 28 (!) 35  Temp: 98.2 F (36.8 C)     TempSrc: Oral     SpO2:  97% 95% 100%  Weight:      Height:        General: appears to be stated age; alert, oriented Skin: warm, dry, no rash Head:  AT/[place] Mouth:  Oral mucosa membranes appear moist, normal dentition Neck: supple; trachea midline Heart:  RRR; did not appreciate any M/R/G Lungs: CTAB, did not appreciate any wheezes, rales, or rhonchi Abdomen: + BS; soft, ND, NT Vascular: 2+ pedal pulses b/l; 2+ radial pulses b/l Extremities: no peripheral edema, no muscle wasting Neuro: Neuro: 5/5 strength of the proximal and distal flexors and extensors of the upper and lower extremities bilaterally; sensation intact in upper and lower extremities b/l; cranial nerves II through XII  grossly intact; no pronator drift; no evidence suggestive of slurred speech, dysarthria, or facial droop; Normal muscle tone. No tremors.    Labs on Admission: I have personally reviewed following labs and imaging studies  CBC: Recent Labs  Lab 10/26/21 1657  WBC 6.9  NEUTROABS 4.7  HGB 11.4*  HCT 36.8  MCV 93.4  PLT 409   Basic Metabolic Panel: Recent Labs  Lab 10/26/21 1657  NA 140  K 3.2*  CL 105  CO2 26  GLUCOSE 86  BUN 10  CREATININE 0.58  CALCIUM 9.3  MG 2.0   GFR: Estimated Creatinine Clearance: 37.9 mL/min (by C-G formula based on SCr of 0.58 mg/dL). Liver Function Tests: No results for input(s): AST, ALT, ALKPHOS, BILITOT, PROT, ALBUMIN in the last 168 hours. No results for input(s): LIPASE, AMYLASE in the last 168 hours. No results for input(s): AMMONIA in the last 168 hours. Coagulation Profile: No results for input(s): INR, PROTIME in the last 168 hours. Cardiac Enzymes: No results for input(s): CKTOTAL, CKMB,  CKMBINDEX, TROPONINI in the last 168 hours. BNP (last 3 results) No results for input(s): PROBNP in the last 8760 hours. HbA1C: No results for input(s): HGBA1C in the last 72 hours. CBG: Recent Labs  Lab 10/26/21 1650  GLUCAP 91   Lipid Profile: No results for input(s): CHOL, HDL, LDLCALC, TRIG, CHOLHDL, LDLDIRECT in the last 72 hours. Thyroid Function Tests: No results for input(s): TSH, T4TOTAL, FREET4, T3FREE, THYROIDAB in the last 72 hours. Anemia Panel: No results for input(s): VITAMINB12, FOLATE, FERRITIN, TIBC, IRON, RETICCTPCT in the last 72 hours. Urine analysis:    Component Value Date/Time   COLORURINE STRAW (A) 10/26/2021 1657   APPEARANCEUR HAZY (A) 10/26/2021 1657   LABSPEC 1.010 10/26/2021 1657   PHURINE 7.0 10/26/2021 1657   GLUCOSEU NEGATIVE 10/26/2021 1657   HGBUR NEGATIVE 10/26/2021 1657   BILIRUBINUR NEGATIVE 10/26/2021 [place] 10/26/2021 1657   PROTEINUR NEGATIVE 10/26/2021 1657   UROBILINOGEN 0.2 12/26/2019 1226   NITRITE NEGATIVE 10/26/2021 1657   LEUKOCYTESUR TRACE (A) 10/26/2021 1657    Radiological Exams on Admission: MR BRAIN WO CONTRAST  Result Date: 10/26/2021 CLINICAL DATA:  Word-finding difficulty EXAM: MRI HEAD WITHOUT CONTRAST TECHNIQUE: Multiplanar, multiecho pulse sequences of the brain and surrounding structures were obtained without intravenous contrast. COMPARISON:  03/14/2021 brain MRI. FINDINGS: Brain: No acute infarct, mass effect or extra-axial collection. No acute or chronic hemorrhage. Hyperintense T2-weighted signal is moderately widespread throughout the white matter. Advanced atrophy for age. The midline structures are normal. 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 Visualized upper cervical spine and soft tissues are normal. Sinuses/Orbits:No paranasal sinus fluid levels or advanced mucosal thickening. No mastoid or middle  ear effusion. Normal orbits. IMPRESSION: 1. No acute intracranial abnormality. 2. Advanced atrophy and sequelae of chronic microvascular ischemia. 3. 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 Electronically Signed   By: Ulyses Jarred M.D.   On: 10/26/2021 21:58     EKG: Independently reviewed, with result as described above.    Assessment/Plan   Principal Problem:   Aphasia Active Problems:   Hypokalemia   Seizure disorder (HCC)     #) Aphasia: 2 episodes of aphasia, self-limited, and without any evidence of acute focal neurologic deficits.  MRI brain showed no evidence of acute process, including no evidence of acute ischemic infarct. The patient's case was discussed with the on-call neurologist, Dr.Kirkpatrick, Who will  formally consult.  Dr. Leonel Ramsay feels that the presentation is consistent with pt's known history of aphasic episodes associated with underlying seizure disorder.  He recommends escalation of her current home dose of Keppra from 500 mg p.o. twice daily to 1000 mg p.o. twice daily, as well as recommends overnight observation to the hospitalist service to further evaluate effectiveness of this updated dose by monitoring closely for evidence of ensuing aphasic episodes.     Plan:  Seizure precautions. Prn IV Ativan for breakthrough seizure.  Monitor on telemetry. continuous pulse-ox. Serial neuro checks. CMP/CBC in the AM. Check serum Mg level.  Add on serum ethanol level. Check UDS.  Add on prolactin level. Neurology consulted, as above.  Per neurology recommendations, home Keppra dose has been increased to 1000 mg p.o. twice daily, with patient having received for such dose of this medication.      #) Hypokalemia: Presenting serum potassium 3.2.  Plan: Potassium chloride 40 equivalents p.o. x1 dose now.  Add on serum magnesium level.  Repeat BMP in the morning.  Monitor on telemetry.       DVT prophylaxis: SCD's   Code Status: Full code Family  Communication: none Disposition Plan: Per Rounding Team Consults called: Dr. Leonel Ramsay of neurology has been formally consulted, as further detailed above;  Admission status: Observation; med telemetry   PLEASE NOTE THAT DRAGON DICTATION SOFTWARE WAS USED IN THE CONSTRUCTION OF THIS NOTE.   [place] DO [facility]  From [place]   10/27/2021, 2:17 AM

## 2021-10-27 NOTE — ED Notes (Signed)
Provider at bedside

## 2021-10-27 NOTE — ED Notes (Signed)
Admit provider at bedside at this time 

## 2021-10-27 NOTE — Discharge Summary (Signed)
Physician Discharge Summary   Patient name: Elaine Rivera  Admit date:     10/26/2021  Discharge date: 10/27/2021  Attending Physician: Rhetta Mura [[id number]]  Discharge Physician: Rhetta Mura   PCP: System, Provider Not In (unable to address this prior to the patient leaving AMA).   Follow-up: unable to exhaustively address items related to arranging for follow-up as the patient leave AMA prior to ability to do this.     Follow-up Information     Schedule an appointment as soon as possible for a visit  with Your primary care doctor.Elaine Rivera to  [facility].   Specialty: Emergency Medicine Why: As needed, If symptoms worsen Contact information: [address] [id number] [place] [phone]                Recommendations at discharge: unable to make exhaustive discharge recommendations to patient as the patient left AMA prior to opportunity to do so. However, recommendation was made to made to patient to increase her existing home Keppra from 500 mg PO BID to 1000 mg PO BID.    Discharge Diagnoses Principal Problem:   Aphasia   Resolved Diagnoses Resolved Problems:   * No resolved hospital problems. [name] is a 76 y.o. female with medical history significant for seizures manifested via aphasia who is admitted to [facility] on 10/26/2021 with aphasia after presenting from ALF to [facility] ED complaining of aphasia.    The patient presents complaining of an episode of aphasia experienced at her assisted living facility earlier in the day.  This was followed by an additional episode of aphasia which was unwitnessed, occurring in the emergency department today.  Episodes were not associated with any generalized tonic clonic activity, tongue biting, loss of bowel/bladder function.  patient conveys that he has these episodes of aphasia lasted  a few minutes in duration before complete resolution and were not associated with any slurring of speech.  She also denies any associated acute focal weakness, acute focal numbness, paresthesias, facial droop, acute change in vision, dysphagia, vertigo.  For additional details relating to HPI, please see my admission H&P dated 10/27/2021.   Please note that the patient left the hospital AGAINST MEDICAL ADVICE within hours of admission, and prior to being seen by the daytime rounding hospitalist.  Within these confines, hospital course, by problem, is as follows:    #) Aphasia: 2 episodes of aphasia, self-limited, and without any evidence of acute focal neurologic deficits.  MRI brain showed no evidence of acute process, including no evidence of acute ischemic infarct. The patient's case was discussed with the on-call neurologist, Dr.Kirkpatrick, Who formally consulted.  Dr. Leonel Ramsay felt that the presentation was consistent with pt's known history of aphasic episodes associated with underlying seizure disorder, and that presentation was not consistent with status epilepticus.  He recommended escalation of her current home dose of Keppra from 500 mg p.o. twice daily to 1000 mg p.o. twice daily, as well as recommended overnight observation to the hospitalist service to further evaluate effectiveness of this updated dose by monitoring closely for evidence of ensuing aphasic episodes.  Further laboratory evaluation was ordered, including serum ethanol level, urinary drug screen, prolactin level, although the patient subsequently left the hospital AMA prior to the studies resulting. Per the above neurology recommendation, home Keppra dose was been increased to  1000 mg p.o. twice daily, and the patient received her first dose of Keppra 1000 mg p.o. while still in the hospital.  However, she subsequently left the hospital [place] prior to being able to receive an updated prescription to reflect this  new dose of Keppra 1000 mg p.o. twice daily.  However, there was a verbal conveyance of the importance of increasing her dose to reflect this updated recommendation in light of the aforementioned multiple aphasic episodes while demonstrating good compliance with her prior Keppra dose of 500 mg p.o. twice daily.  The patient was strongly encouraged to remain in the hospital to enact the above plan as it related to her presenting multiple aphasic episodes.  Risks of leaving the hospital [place] in the setting were conveyed to the patient, including increased risk of death, additional episodes of aphasia/seizures, and musculoskeletal injury as a consequence of these factors.  The patient verbalized understanding of these risks, and in spite of these conversations, ultimately left the hospital AMA.       #) Hypokalemia: Presenting serum potassium 3.2.  Plan included potassium chloride 40 mEq p.o. x1 dose.  However, the patient left the hospital AMA prior to receiving this intended dose of potassium supplementation.  Additionally, plan included adding on serum magnesium level, with as needed supplementation on the basis of this ensuing result.  However, the patient left the hospital AMA prior the result of this serum magnesium level becoming available.  Overnight telemetry monitoring ordered.     Service: TRH (Hospitalist)    Procedures performed: (none)   Condition at discharge: fair  Exam Physical Exam  General: appears to be stated age; alert, oriented Skin: warm, dry, no rash Head:  AT/[place] Mouth:  Oral mucosa membranes appear moist, normal dentition Neck: supple; trachea midline Heart:  RRR; did not appreciate any M/R/G Lungs: CTAB, did not appreciate any wheezes, rales, or rhonchi Abdomen: + BS; soft, ND, NT Vascular: 2+ pedal pulses b/l; 2+ radial pulses b/l Extremities: no peripheral edema, no muscle wasting Neuro: Neuro: 5/5 strength of the proximal and distal flexors  and extensors of the upper and lower extremities bilaterally; sensation intact in upper and lower extremities b/l; cranial nerves II through XII grossly intact; no pronator drift; no evidence suggestive of slurred speech, dysarthria, or facial droop; Normal muscle tone. No tremors.   Disposition: (patient leave the hospital AMA)  Discharge time: less than 30 minutes.   Discharge Medications: Miserable recommendation to increase dose of home Keppra, as quantified/specified above.   MR BRAIN WO CONTRAST  Result Date: 10/26/2021 CLINICAL DATA:  Word-finding difficulty EXAM: MRI HEAD WITHOUT CONTRAST TECHNIQUE: Multiplanar, multiecho pulse sequences of the brain and surrounding structures were obtained without intravenous contrast. COMPARISON:  03/14/2021 brain MRI. FINDINGS: Brain: No acute infarct, mass effect or extra-axial collection. No acute or chronic hemorrhage. Hyperintense T2-weighted signal is moderately widespread throughout the white matter. Advanced atrophy for age. The midline structures are normal. 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 Visualized upper cervical spine and soft tissues are normal. Sinuses/Orbits:No paranasal sinus fluid levels or advanced mucosal thickening. No mastoid or middle ear effusion. Normal orbits. IMPRESSION: 1. No acute intracranial abnormality. 2. Advanced atrophy and sequelae of chronic microvascular ischemia. 3. 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 Electronically Signed   By: Ulyses Jarred M.D.   On: 10/26/2021 21:58   Results for orders placed or performed during  the hospital encounter of 10/26/21  Resp Panel by RT-PCR (Flu A&B, Covid) Nasopharyngeal Swab     Status: None   Collection Time: 10/26/21  5:27 PM   Specimen: Nasopharyngeal Swab; Nasopharyngeal(NP) swabs in vial transport medium  Result Value Ref Range Status   SARS Coronavirus 2 by  RT PCR NEGATIVE NEGATIVE Final    Comment: (NOTE) SARS-CoV-2 target nucleic acids are NOT DETECTED.  The SARS-CoV-2 RNA is generally detectable in upper respiratory specimens during the acute phase of infection. The lowest concentration of SARS-CoV-2 viral copies this assay can detect is 138 copies/mL. A negative result does not preclude SARS-Cov-2 infection and should not be used as the sole basis for treatment or other patient management decisions. A negative result may occur with  improper specimen collection/handling, submission of specimen other than nasopharyngeal swab, presence of viral mutation(s) within the areas targeted by this assay, and inadequate number of viral copies(<138 copies/mL). A negative result must be combined with clinical observations, patient history, and epidemiological information. The expected result is Negative.  Fact Sheet for Patients:  EntrepreneurPulse.com.au  Fact Sheet for Healthcare Providers:  IncredibleEmployment.be  This test is no t yet approved or cleared by the [place] FDA and  has been authorized for detection and/or diagnosis of SARS-CoV-2 by FDA under an Emergency Use Authorization (EUA). This EUA will remain  in effect (meaning this test can be used) for the duration of the COVID-19 declaration under Section 564(b)(1) of the Act, 21 U.S.C.section 360bbb-3(b)(1), unless the authorization is terminated  or revoked sooner.       Influenza A by PCR NEGATIVE NEGATIVE Final   Influenza B by PCR NEGATIVE NEGATIVE Final    Comment: (NOTE) The Xpert Xpress SARS-CoV-2/FLU/RSV plus assay is intended as an aid in the diagnosis of influenza from Nasopharyngeal swab specimens and should not be used as a sole basis for treatment. Nasal washings and aspirates are unacceptable for Xpert Xpress SARS-CoV-2/FLU/RSV testing.  Fact Sheet for Patients: EntrepreneurPulse.com.au  Fact Sheet  for Healthcare Providers: IncredibleEmployment.be  This test is not yet approved or cleared by the [place] FDA and has been authorized for detection and/or diagnosis of SARS-CoV-2 by FDA under an Emergency Use Authorization (EUA). This EUA will remain in effect (meaning this test can be used) for the duration of the COVID-19 declaration under Section 564(b)(1) of the Act, 21 U.S.C. section 360bbb-3(b)(1), unless the authorization is terminated or revoked.  Performed at [facility], [place] [address], [place], [place] [address]     Signed:  [place] DO.  [facility] 10/27/2021, 2:18 AM

## 2021-10-27 NOTE — ED Notes (Signed)
Pt on call bell states that she couldn't answer because she was having a seizure. Was in the room with another pt when she called out. Pa EPhylliss Bob made aware that per pt she was having a seizure

## 2021-10-27 NOTE — ED Notes (Signed)
Per admit provider send pt with prescription that was completed by ED provider. They already spoke with her in reference to leaving AMA. Pt signed AMA form

## 2021-10-27 NOTE — Consult Note (Signed)
Neurology Consultation Reason for Consult: Seizures Referring Physician: Wyn Quaker  CC: Episodes of difficulty speaking  History is obtained from: Patient, chart review  HPI: Elaine Rivera is a 76 y.o. female with a history of recurrent episodes of aphasia which were diagnosed as seizures by continuous EEG in June 2022.  She has been having multiple recurrent episodes tonight, estimates greater than 10.  She is quite upset when I am seeing her, insisting on leaving and going home.  She states that she is not getting anything that she needs here, and would rather leave.  This does make some aspects of the history more difficult to obtain.    Past Medical History:  Diagnosis Date   Anemia    years ago   Arthritis    Basal cell carcinoma of eye    biopsy of left eye/ non cancerous   Complication of anesthesia    nausea/vomiting   Depression    GERD (gastroesophageal reflux disease)    History of kidney stones    Hypertension    no longer on medications     Family History  Family history unknown: Yes     Social History:  reports that she has never smoked. She has never used smokeless tobacco. She reports that she does not drink alcohol and does not use drugs.   Exam: Current vital signs: BP 129/77    Pulse 64    Temp 98.2 F (36.8 C) (Oral)    Resp (!) 28    Ht 4\' 11"  (1.499 m)    Wt 39.5 kg    SpO2 95%    BMI 17.57 kg/m  Vital signs in last 24 hours: Temp:  [98.2 F (36.8 C)-98.4 F (36.9 C)] 98.2 F (36.8 C) (01/29 2303) Pulse Rate:  [64-83] 64 (01/30 0100) Resp:  [13-28] 28 (01/30 0100) BP: (117-152)/(70-114) 129/77 (01/30 0100) SpO2:  [94 %-97 %] 95 % (01/30 0100) Weight:  [39.5 kg] 39.5 kg (01/29 1646)   Physical Exam  Constitutional: Appears well-developed and well-nourished.  Psych: Affect appropriate to situation Eyes: No scleral injection HENT: No OP obstruction MSK: no joint deformities.  Cardiovascular: Normal rate and regular rhythm.   Respiratory: Effort normal, non-labored breathing GI: Soft.  No distension. There is no tenderness.  Skin: WDI  Neuro: Mental Status: Patient is awake, alert, oriented to person, place, month, year, and situation. Patient is able to give a clear and coherent history. No signs of aphasia or neglect Cranial Nerves: II: Visual Fields are full. Pupils are equal, round, and reactive to light.   III,IV, VI: EOMI without ptosis or diploplia.  V: Facial sensation is symmetric to temperature VII: Facial movement is symmetric.  VIII: hearing is intact to voice Motor: She does not cooperate well with formal strength testing, but she moves all extremities relatively symmetrically.   Sensory: Sensation is symmetric to light touch and temperature in the arms and legs. Cerebellar: FNF and HKS are intact bilaterally   I have reviewed labs in epic and the results pertinent to this consultation are: UA negative3 BMP with mild hypokalemia, otherwise unremarkable Magnesium 2.0  I have reviewed the images obtained: MRI brain-negative  Impression: 76 year old female with multiple recurrent episodes consistent with her previous complex partial seizures.  She has only had one since an additional oral load of Keppra, and my suspicion is that it had not had a chance to fully take effect when she had it.  That being said, with the number that she  has had today I would typically recommend continued observation but the patient is adamant that she does not want to be admitted.  I do feel like she is competent to make this decision.  I indicated that if she were to have a seizure that did not stop this could be risky endangering her health and life and therefore I would recommend she stay  I do think that a single dose of Ativan could be helpful in calming down her seizure flurry.  Recommendations: 1) Ativan 1 mg x 1 2) increase Keppra to 1 g twice daily 3) neurology will follow if the patient  remains.   Roland Rack, MD [facility] [phone]  If 7pm- 7am, please page neurology on call as listed in [place].

## 2021-10-27 NOTE — ED Notes (Signed)
Admit provider at bedside. Pt is currently refusing lab work and states that she wants to go home

## 2021-10-29 DIAGNOSIS — Z8719 Personal history of other diseases of the digestive system: Secondary | ICD-10-CM

## 2021-10-29 DIAGNOSIS — K922 Gastrointestinal hemorrhage, unspecified: Secondary | ICD-10-CM

## 2021-10-29 HISTORY — DX: Personal history of other diseases of the digestive system: Z87.19

## 2021-10-29 HISTORY — DX: Gastrointestinal hemorrhage, unspecified: K92.2

## 2021-11-11 ENCOUNTER — Emergency Department (HOSPITAL_COMMUNITY): Payer: Medicare Other

## 2021-11-11 ENCOUNTER — Other Ambulatory Visit: Payer: Self-pay

## 2021-11-11 ENCOUNTER — Inpatient Hospital Stay (HOSPITAL_COMMUNITY)
Admission: EM | Admit: 2021-11-11 | Discharge: 2021-11-14 | DRG: 377 | Disposition: A | Discharge status: Skilled Nursing Facility | Payer: Medicare Other | Source: Skilled Nursing Facility | Attending: Internal Medicine | Admitting: Internal Medicine

## 2021-11-11 ENCOUNTER — Encounter (HOSPITAL_COMMUNITY): Payer: Self-pay

## 2021-11-11 DIAGNOSIS — D62 Acute posthemorrhagic anemia: Secondary | ICD-10-CM

## 2021-11-11 DIAGNOSIS — I10 Essential (primary) hypertension: Secondary | ICD-10-CM | POA: Diagnosis not present

## 2021-11-11 DIAGNOSIS — K922 Gastrointestinal hemorrhage, unspecified: Secondary | ICD-10-CM | POA: Diagnosis present

## 2021-11-11 DIAGNOSIS — K921 Melena: Secondary | ICD-10-CM | POA: Diagnosis present

## 2021-11-11 DIAGNOSIS — E43 Unspecified severe protein-calorie malnutrition: Secondary | ICD-10-CM | POA: Diagnosis present

## 2021-11-11 DIAGNOSIS — Z85840 Personal history of malignant neoplasm of eye: Secondary | ICD-10-CM

## 2021-11-11 DIAGNOSIS — M419 Scoliosis, unspecified: Secondary | ICD-10-CM | POA: Diagnosis present

## 2021-11-11 DIAGNOSIS — K222 Esophageal obstruction: Secondary | ICD-10-CM | POA: Diagnosis present

## 2021-11-11 DIAGNOSIS — J189 Pneumonia, unspecified organism: Secondary | ICD-10-CM

## 2021-11-11 DIAGNOSIS — K254 Chronic or unspecified gastric ulcer with hemorrhage: Secondary | ICD-10-CM | POA: Diagnosis not present

## 2021-11-11 DIAGNOSIS — Z882 Allergy status to sulfonamides status: Secondary | ICD-10-CM

## 2021-11-11 DIAGNOSIS — G40909 Epilepsy, unspecified, not intractable, without status epilepticus: Secondary | ICD-10-CM

## 2021-11-11 DIAGNOSIS — I671 Cerebral aneurysm, nonruptured: Secondary | ICD-10-CM | POA: Diagnosis not present

## 2021-11-11 DIAGNOSIS — M1990 Unspecified osteoarthritis, unspecified site: Secondary | ICD-10-CM | POA: Diagnosis not present

## 2021-11-11 DIAGNOSIS — Z993 Dependence on wheelchair: Secondary | ICD-10-CM

## 2021-11-11 DIAGNOSIS — E860 Dehydration: Secondary | ICD-10-CM | POA: Diagnosis present

## 2021-11-11 DIAGNOSIS — M549 Dorsalgia, unspecified: Secondary | ICD-10-CM | POA: Diagnosis present

## 2021-11-11 DIAGNOSIS — Z681 Body mass index (BMI) 19 or less, adult: Secondary | ICD-10-CM | POA: Diagnosis not present

## 2021-11-11 DIAGNOSIS — M542 Cervicalgia: Secondary | ICD-10-CM | POA: Diagnosis present

## 2021-11-11 DIAGNOSIS — R627 Adult failure to thrive: Secondary | ICD-10-CM | POA: Diagnosis present

## 2021-11-11 DIAGNOSIS — K449 Diaphragmatic hernia without obstruction or gangrene: Secondary | ICD-10-CM | POA: Diagnosis present

## 2021-11-11 DIAGNOSIS — Z85828 Personal history of other malignant neoplasm of skin: Secondary | ICD-10-CM

## 2021-11-11 DIAGNOSIS — K2970 Gastritis, unspecified, without bleeding: Secondary | ICD-10-CM | POA: Diagnosis present

## 2021-11-11 DIAGNOSIS — K319 Disease of stomach and duodenum, unspecified: Secondary | ICD-10-CM | POA: Diagnosis present

## 2021-11-11 DIAGNOSIS — K2100 Gastro-esophageal reflux disease with esophagitis, without bleeding: Secondary | ICD-10-CM | POA: Diagnosis not present

## 2021-11-11 DIAGNOSIS — G8929 Other chronic pain: Secondary | ICD-10-CM | POA: Diagnosis present

## 2021-11-11 DIAGNOSIS — J9811 Atelectasis: Secondary | ICD-10-CM | POA: Diagnosis not present

## 2021-11-11 DIAGNOSIS — Z20822 Contact with and (suspected) exposure to covid-19: Secondary | ICD-10-CM | POA: Diagnosis not present

## 2021-11-11 DIAGNOSIS — I444 Left anterior fascicular block: Secondary | ICD-10-CM

## 2021-11-11 DIAGNOSIS — N390 Urinary tract infection, site not specified: Secondary | ICD-10-CM

## 2021-11-11 DIAGNOSIS — R4181 Age-related cognitive decline: Secondary | ICD-10-CM | POA: Diagnosis present

## 2021-11-11 DIAGNOSIS — D509 Iron deficiency anemia, unspecified: Secondary | ICD-10-CM | POA: Diagnosis present

## 2021-11-11 HISTORY — DX: Left anterior fascicular block: I44.4

## 2021-11-11 LAB — CBC WITH DIFFERENTIAL/PLATELET
Abs Immature Granulocytes: 0.01 10*3/uL (ref 0.00–0.07)
Basophils Absolute: 0 10*3/uL (ref 0.0–0.1)
Basophils Relative: 1 %
Eosinophils Absolute: 0.1 10*3/uL (ref 0.0–0.5)
Eosinophils Relative: 1 %
HCT: 31 % — ABNORMAL LOW (ref 36.0–46.0)
Hemoglobin: 9.5 g/dL — ABNORMAL LOW (ref 12.0–15.0)
Immature Granulocytes: 0 %
Lymphocytes Relative: 24 %
Lymphs Abs: 1.2 10*3/uL (ref 0.7–4.0)
MCH: 28.4 pg (ref 26.0–34.0)
MCHC: 30.6 g/dL (ref 30.0–36.0)
MCV: 92.8 fL (ref 80.0–100.0)
Monocytes Absolute: 0.5 10*3/uL (ref 0.1–1.0)
Monocytes Relative: 11 %
Neutro Abs: 3.1 10*3/uL (ref 1.7–7.7)
Neutrophils Relative %: 63 %
Platelets: 371 10*3/uL (ref 150–400)
RBC: 3.34 MIL/uL — ABNORMAL LOW (ref 3.87–5.11)
RDW: 13.3 % (ref 11.5–15.5)
WBC: 4.8 10*3/uL (ref 4.0–10.5)
nRBC: 0 % (ref 0.0–0.2)

## 2021-11-11 LAB — COMPREHENSIVE METABOLIC PANEL
ALT: 14 U/L (ref 0–44)
AST: 19 U/L (ref 15–41)
Albumin: 3.4 g/dL — ABNORMAL LOW (ref 3.5–5.0)
Alkaline Phosphatase: 75 U/L (ref 38–126)
Anion gap: 7 (ref 5–15)
BUN: 12 mg/dL (ref 8–23)
CO2: 26 mmol/L (ref 22–32)
Calcium: 9.4 mg/dL (ref 8.9–10.3)
Chloride: 106 mmol/L (ref 98–111)
Creatinine, Ser: 0.59 mg/dL (ref 0.44–1.00)
GFR, Estimated: >60 mL/min (ref >60)
Glucose, Bld: 87 mg/dL (ref 70–99)
Potassium: 3.6 mmol/L (ref 3.5–5.1)
Sodium: 139 mmol/L (ref 135–145)
Total Bilirubin: 0.3 mg/dL (ref 0.3–1.2)
Total Protein: 6.3 g/dL — ABNORMAL LOW (ref 6.5–8.1)

## 2021-11-11 LAB — URINALYSIS, ROUTINE W REFLEX MICROSCOPIC
Bilirubin Urine: NEGATIVE
Glucose, UA: NEGATIVE mg/dL
Ketones, ur: NEGATIVE mg/dL
Nitrite: NEGATIVE
Protein, ur: NEGATIVE mg/dL
Specific Gravity, Urine: 1.012 (ref 1.005–1.030)
pH: 7 (ref 5.0–8.0)

## 2021-11-11 LAB — RETICULOCYTES
Immature Retic Fract: 13.3 % (ref 2.3–15.9)
RBC.: 3.25 MIL/uL — ABNORMAL LOW (ref 3.87–5.11)
Retic Count, Absolute: 71.2 10*3/uL (ref 19.0–186.0)
Retic Ct Pct: 2.2 % (ref 0.4–3.1)

## 2021-11-11 LAB — VITAMIN B12: Vitamin B-12: 274 pg/mL (ref 180–914)

## 2021-11-11 LAB — TYPE AND SCREEN
ABO/RH(D): O POS
Antibody Screen: NEGATIVE

## 2021-11-11 LAB — TROPONIN I (HIGH SENSITIVITY): Troponin I (High Sensitivity): 3 ng/L (ref <18)

## 2021-11-11 LAB — CBG MONITORING, ED: Glucose-Capillary: 84 mg/dL (ref 70–99)

## 2021-11-11 LAB — RESP PANEL BY RT-PCR (FLU A&B, COVID) ARPGX2
Influenza A by PCR: NEGATIVE
Influenza B by PCR: NEGATIVE
SARS Coronavirus 2 by RT PCR: NEGATIVE

## 2021-11-11 LAB — IRON AND TIBC
Iron: 26 ug/dL — ABNORMAL LOW (ref 28–170)
Saturation Ratios: 7 % — ABNORMAL LOW (ref 10.4–31.8)
TIBC: 369 ug/dL (ref 250–450)
UIBC: 343 ug/dL

## 2021-11-11 LAB — LACTIC ACID, PLASMA: Lactic Acid, Venous: 1.1 mmol/L (ref 0.5–1.9)

## 2021-11-11 LAB — POC OCCULT BLOOD, ED: Fecal Occult Bld: POSITIVE — AB

## 2021-11-11 LAB — FOLATE: Folate: 10.7 ng/mL (ref >5.9)

## 2021-11-11 LAB — HEMOGLOBIN: Hemoglobin: 9.2 g/dL — ABNORMAL LOW (ref 12.0–15.0)

## 2021-11-11 LAB — FERRITIN: Ferritin: 7 ng/mL — ABNORMAL LOW (ref 11–307)

## 2021-11-11 MED ORDER — SODIUM CHLORIDE 0.9 % IV SOLN
250.0000 mg | Freq: Every day | INTRAVENOUS | Status: AC
  Administered 2021-11-11 – 2021-11-12 (×2): 250 mg via INTRAVENOUS
  Filled 2021-11-11 (×2): qty 20

## 2021-11-11 MED ORDER — SODIUM CHLORIDE 0.9 % IV SOLN
1.0000 g | Freq: Once | INTRAVENOUS | Status: AC
  Administered 2021-11-11: 1 g via INTRAVENOUS
  Filled 2021-11-11: qty 10

## 2021-11-11 MED ORDER — PANTOPRAZOLE SODIUM 40 MG IV SOLR
40.0000 mg | Freq: Once | INTRAVENOUS | Status: AC
  Administered 2021-11-11: 40 mg via INTRAVENOUS
  Filled 2021-11-11: qty 10

## 2021-11-11 MED ORDER — ONDANSETRON HCL 4 MG/2ML IJ SOLN
4.0000 mg | Freq: Four times a day (QID) | INTRAMUSCULAR | Status: DC | PRN
  Administered 2021-11-12 – 2021-11-14 (×3): 4 mg via INTRAVENOUS
  Filled 2021-11-11 (×3): qty 2

## 2021-11-11 MED ORDER — LACTATED RINGERS IV BOLUS
1000.0000 mL | Freq: Once | INTRAVENOUS | Status: AC
  Administered 2021-11-11: 1000 mL via INTRAVENOUS

## 2021-11-11 MED ORDER — CYANOCOBALAMIN 1000 MCG/ML IJ SOLN
1000.0000 ug | Freq: Every day | INTRAMUSCULAR | Status: DC
  Administered 2021-11-11 – 2021-11-14 (×3): 1000 ug via INTRAMUSCULAR
  Filled 2021-11-11 (×4): qty 1

## 2021-11-11 MED ORDER — AZITHROMYCIN 500 MG IV SOLR
500.0000 mg | Freq: Once | INTRAVENOUS | Status: AC
  Administered 2021-11-11: 500 mg via INTRAVENOUS
  Filled 2021-11-11: qty 5

## 2021-11-11 MED ORDER — KCL IN DEXTROSE-NACL 30-5-0.45 MEQ/L-%-% IV SOLN
INTRAVENOUS | Status: AC
  Filled 2021-11-11 (×3): qty 1000

## 2021-11-11 MED ORDER — PANTOPRAZOLE SODIUM 40 MG IV SOLR
40.0000 mg | Freq: Two times a day (BID) | INTRAVENOUS | Status: DC
  Administered 2021-11-11 – 2021-11-14 (×5): 40 mg via INTRAVENOUS
  Filled 2021-11-11 (×5): qty 10

## 2021-11-11 MED ORDER — MIRTAZAPINE 15 MG PO TABS
7.5000 mg | ORAL_TABLET | Freq: Every day | ORAL | Status: DC
  Administered 2021-11-12 – 2021-11-13 (×2): 7.5 mg via ORAL
  Filled 2021-11-11 (×2): qty 1

## 2021-11-11 MED ORDER — LEVETIRACETAM 500 MG PO TABS
750.0000 mg | ORAL_TABLET | Freq: Two times a day (BID) | ORAL | Status: DC
  Administered 2021-11-11: 750 mg via ORAL
  Filled 2021-11-11 (×2): qty 1

## 2021-11-11 MED ORDER — ONDANSETRON HCL 4 MG PO TABS: 4.0000 mg | ORAL_TABLET | Freq: Four times a day (QID) | ORAL | Status: DC | PRN

## 2021-11-11 MED ORDER — FOLIC ACID 1 MG PO TABS
1.0000 mg | ORAL_TABLET | Freq: Every day | ORAL | Status: DC
  Administered 2021-11-13 – 2021-11-14 (×2): 1 mg via ORAL
  Filled 2021-11-11 (×2): qty 1

## 2021-11-11 MED ORDER — SODIUM CHLORIDE 0.9 % IV BOLUS
500.0000 mL | Freq: Once | INTRAVENOUS | Status: AC
  Administered 2021-11-11: 500 mL via INTRAVENOUS

## 2021-11-11 MED ORDER — KCL IN DEXTROSE-NACL 30-5-0.45 MEQ/L-%-% IV SOLN
INTRAVENOUS | Status: DC
  Filled 2021-11-11 (×2): qty 1000

## 2021-11-11 NOTE — Hospital Course (Signed)
76 year old female with past medical history of seizures and aphasia, she had an EGD by Dr. Collene Mares in 2017 that showed esophagitis who resides at skilled nursing facility relates she was not feeling well in general the patient cannot provide a good history and she does not offer any essential insight of why she is here she just relates that state her PCP at the facility told her she does not look good so recommended for her to come in to the ED she denies any fever, cough, shortness of breath, nausea, vomiting, diarrhea no abdominal pain no asymmetric weakness no urinary symptoms.  According to the patient she relates she fell to the floor and hit her neck  In the ED: He was found to be afebrile vitals stable, hemoglobin of 9 (previous hemoglobin last month in 2023 was 11.4) CT of the head showed no acute intracranial changes chronic microvascular ischemic changes, CT of the spine showed no acute fracture listhesis or dislocation.  Chest x-ray showed enlarged cardiac silhouette and right and left basilar atelectasis.  In the ED rectal exam was done that showed melanotic stools

## 2021-11-11 NOTE — Assessment & Plan Note (Signed)
Ensure 3 times daily counseling.

## 2021-11-11 NOTE — ED Provider Triage Note (Signed)
Emergency Medicine Provider Triage Evaluation Note  Elaine Rivera , a 76 y.o. female  was evaluated in triage.  Pt complains of diarrhea and generalized weakness.  Patient is from [place] home.  Patient was seen by facility doctor.  Patient daughter called EMS due to patient's symptoms.  Patient last episode of diarrhea was this morning and nonbloody.  Denies chest pain, shortness of breath, abdominal pain, nausea, vomiting, fever, chills.  Review of Systems  Positive: As per HPI above Negative: Chest pain, shortness of breath  Physical Exam  BP 140/80 (BP Location: Right Arm)    Pulse 79    Temp 97.8 F (36.6 C) (Oral)    Resp 16    Ht 4\' 11"  (1.499 m)    Wt 39.5 kg    SpO2 95%    BMI 17.57 kg/m  Gen:   Awake, no distress   Resp:  Normal effort  MSK:   Moves extremities without difficulty  Other:  No abdominal tenderness to palpation.  Medical Decision Making  Medically screening exam initiated at 11:50 AM.  Appropriate orders placed.  Lashannon CATERA HANKINS was informed that the remainder of the evaluation will be completed by another provider, this initial triage assessment does not replace that evaluation, and the importance of remaining in the ED until their evaluation is complete.  Discussed with patient COVID and flu swab, patient denied COVID and flu swab at this time.   Jacklynn Dehaas A, PA-C 11/11/21 1212

## 2021-11-11 NOTE — H&P (Addendum)
History and Physical    Patient: Elaine Rivera:[id number] DOB: May 10, 1946 DOA: 11/11/2021 DOS: the patient was seen and examined on 11/11/2021 PCP: System, Provider Not In  Patient coming from: SNF  Chief Complaint:  Chief Complaint  Patient presents with   Diarrhea   Weakness    HPI: Elaine Rivera is a 76 y.o. female with medical history significant of of seizures and aphasia, she had an EGD by Dr. Collene Mares in 2017 that showed esophagitis who resides at skilled nursing facility relates she was not feeling well in general the patient cannot provide a good history and she does not offer any essential insight of why she is here she just relates that state her PCP at the facility told her she does not look good so recommended for her to come in to the ED she denies any fever, cough, shortness of breath, nausea, vomiting, diarrhea no abdominal pain no asymmetric weakness no urinary symptoms.  According to the patient she relates she fell to the floor and hit her neck  In the ED: He was found to be afebrile vitals stable, hemoglobin of 9 (previous hemoglobin last month in 2023 was 11.4) CT of the head showed no acute intracranial changes chronic microvascular ischemic changes, CT of the spine showed no acute fracture listhesis or dislocation.  Chest x-ray showed enlarged cardiac silhouette and right and left basilar atelectasis.  In the ED rectal exam was done that showed melanotic stools  Review of Systems: As mentioned in the history of present illness. All other systems reviewed and are negative. Past Medical History:  Diagnosis Date   Anemia    years ago   Arthritis    Basal cell carcinoma of eye    biopsy of left eye/ non cancerous   Complication of anesthesia    nausea/vomiting   Depression    GERD (gastroesophageal reflux disease)    History of kidney stones    Hypertension    no longer on medications   Seizures ([place])    Manifesting with aphasia   Past Surgical History:   Procedure Laterality Date   CEREBRAL ANEURYSM REPAIR     1998   COLONOSCOPY     ECTOPIC PREGNANCY SURGERY     x2    ENTEROSCOPY N/A 03/01/2020   Procedure: ENTEROSCOPY;  Surgeon: Carol Ada, MD;  Location: [facility];  Service: Endoscopy;  Laterality: N/A;   ESOPHAGOGASTRODUODENOSCOPY N/A 04/22/2016   Procedure: ESOPHAGOGASTRODUODENOSCOPY (EGD);  Surgeon: Juanita Craver, MD;  Location: [facility];  Service: Endoscopy;  Laterality: N/A;   IR 3D INDEPENDENT WKST  09/05/2018   IR ANGIO INTRA EXTRACRAN SEL COM CAROTID INNOMINATE BILAT MOD SED  09/05/2018   IR ANGIO VERTEBRAL SEL VERTEBRAL UNI R MOD SED  09/05/2018   IR ANGIOGRAM FOLLOW UP STUDY  09/05/2018   IR RADIOLOGIST EVAL & MGMT  03/28/2021   IR TRANSCATH/EMBOLIZ  09/05/2018   PARATHYROIDECTOMY     2002   RADIOLOGY WITH ANESTHESIA N/A 09/05/2018   Procedure: EMBOLIZATION;  Surgeon: Radiologist, Medication, MD;  Location: [facility];  Service: Radiology;  Laterality: N/A;   TONSILLECTOMY     1953   VAGINAL HYSTERECTOMY     2001   VOCAL CORD LATERALIZATION, ENDOSCOPIC APPROACH W/ MLB     2007 (@[facility] )   WRIST SURGERY     left side/2008   WRIST SURGERY     right side/ 2002   Social History:  reports that she has never smoked. She has never used smokeless  tobacco. She reports that she does not drink alcohol and does not use drugs.  Allergies  Allergen Reactions   Sulfa Antibiotics Other (See Comments)    Reaction:  Unknown     Family History  Family history unknown: Yes    Prior to Admission medications   Medication Sig Start Date End Date Taking? Authorizing Provider  levETIRAcetam (KEPPRA) 750 MG tablet Take 1 tablet (750 mg total) by mouth 2 (two) times daily. 10/26/21   Lorin Glass, PA-C  mirtazapine (REMERON) 7.5 MG tablet Take 1 tablet (7.5 mg total) by mouth at bedtime. 08/26/21   Shawna Clamp, MD    Physical Exam: Vitals:   11/11/21 1236 11/11/21 1401 11/11/21 1538 11/11/21 1617  BP: (!) 146/80 (!) 143/88   139/81  Pulse: 72 70 69 69  Resp: 16 16 19 20   Temp:      TempSrc:      SpO2: 98% 99% 98% 96%  Weight:      Height:       General exam: In no acute distress. Respiratory system: Good air movement and clear to auscultation. Cardiovascular system: S1 & S2 heard, RRR. No JVD. Gastrointestinal system: Abdomen is nondistended, soft and nontender.  Extremities: No pedal edema. Skin: No rashes, lesions or ulcers Psychiatry: No judgement and insight appear normal. Mood & affect appropriate.  Data Reviewed:    I have Reviewed nursing notes, Vitals, and Lab results since pt's last encounter. Pertinent lab results CBC shows a drop in hemoglobin I have ordered test including hemoglobin every 12 hours and she was started on IV Protonix and IV fluids I have discussed pt's care plan and test results with patient.  Assessment and Plan: * Acute lower GI bleeding Rectal exam by the ED physician showed melanotic stools We will go ahead and start her on IV Protonix twice a day. The ED physician has already consulted Dr. Janan Halter who will see the patient. Will  place her n.p.o. started on IV fluids. Check and anemia panel. Check hemoglobin every 12 hours. He appears dehydrated so I expect mild drop in hemoglobin after hydration. He has endoscopy by Dr. Collene Mares that showed esophagitis in 2017. Chest x-ray showed possible bilateral atelectasis the patient denies any cough shortness of breath any fever no leukocytosis discontinue antibiotics. Her UA does not show any signs of infection.   Acute blood loss anemia Likely due to GI losses Monitor hemoglobin every 12 hours. We will type and screen.  GI has already been consulted .  HTN (hypertension)- (present on admission) Currently on no antihypertensive medication, blood pressure seems to be relatively well controlled.  Protein-calorie malnutrition, severe- (present on admission) Ensure 3 times daily counseling.  Seizure disorder ([place]) Continue  Keppra       Advance Care Planning:   Code Status: Full Code full  Consults: Gastroenterology  Family Communication: None  Severity of Illness: The appropriate patient status for this patient is INPATIENT. Inpatient status is judged to be reasonable and necessary in order to provide the required intensity of service to ensure the patient's safety. The patient's presenting symptoms, physical exam findings, and initial radiographic and laboratory data in the context of their chronic comorbidities is felt to place them at high risk for further clinical deterioration. Furthermore, it is not anticipated that the patient will be medically stable for discharge from the hospital within 2 midnights of admission.   * I certify that at the point of admission it is my clinical judgment that the  patient will require inpatient hospital care spanning beyond 2 midnights from the point of admission due to high intensity of service, high risk for further deterioration and high frequency of surveillance required.*  Author: Charlynne Cousins, MD 11/11/2021 5:45 PM  For on call review [email or web address].

## 2021-11-11 NOTE — H&P (View-Only) (Signed)
Reason for Consult:Anemia, Heme positive stool Referring Physician: [facility]  Elaine Rivera DOI HPI: This is a 76 year old female with a PMH of an LA Grade B esophagitis and seizures admitted for anemia and heme positive stool.  There some report of melena, but she denies seeing any black stools.  However, does admit that she was not paying attention.  She was noted to be off her baseline when she was seeing her PCP today.  The patient was sent to the ER and she was noted to be anemic with an HGB of 9.5 g/dL, which was a drop from 11.4 g/dL on 10/26/2021.  She was last evaluated on 03/01/2020 for anemia with an enteroscopy, which only showed a 5 cm hiatal hernia.  Her last colonoscopy was in 2018 with Dr. Collene Mares with findings of a small adenoma.  Past Medical History:  Diagnosis Date   Anemia    years ago   Arthritis    Basal cell carcinoma of eye    biopsy of left eye/ non cancerous   Complication of anesthesia    nausea/vomiting   Depression    GERD (gastroesophageal reflux disease)    History of kidney stones    Hypertension    no longer on medications   Seizures ([place])    Manifesting with aphasia    Past Surgical History:  Procedure Laterality Date   CEREBRAL ANEURYSM REPAIR     1998   COLONOSCOPY     ECTOPIC PREGNANCY SURGERY     x2    ENTEROSCOPY N/A 03/01/2020   Procedure: ENTEROSCOPY;  Surgeon: Carol Ada, MD;  Location: [facility];  Service: Endoscopy;  Laterality: N/A;   ESOPHAGOGASTRODUODENOSCOPY N/A 04/22/2016   Procedure: ESOPHAGOGASTRODUODENOSCOPY (EGD);  Surgeon: Juanita Craver, MD;  Location: [facility];  Service: Endoscopy;  Laterality: N/A;   IR 3D INDEPENDENT WKST  09/05/2018   IR ANGIO INTRA EXTRACRAN SEL COM CAROTID INNOMINATE BILAT MOD SED  09/05/2018   IR ANGIO VERTEBRAL SEL VERTEBRAL UNI R MOD SED  09/05/2018   IR ANGIOGRAM FOLLOW UP STUDY  09/05/2018   IR RADIOLOGIST EVAL & MGMT  03/28/2021   IR TRANSCATH/EMBOLIZ  09/05/2018   PARATHYROIDECTOMY     2002    RADIOLOGY WITH ANESTHESIA N/A 09/05/2018   Procedure: EMBOLIZATION;  Surgeon: Radiologist, Medication, MD;  Location: [facility];  Service: Radiology;  Laterality: N/A;   TONSILLECTOMY     1953   VAGINAL HYSTERECTOMY     2001   VOCAL CORD LATERALIZATION, ENDOSCOPIC APPROACH W/ MLB     2007 (@[facility] )   WRIST SURGERY     left side/2008   WRIST SURGERY     right side/ 2002    Family History  Family history unknown: Yes    Social History:  reports that she has never smoked. She has never used smokeless tobacco. She reports that she does not drink alcohol and does not use drugs.  Allergies:  Allergies  Allergen Reactions   Sulfa Antibiotics Other (See Comments)    Reaction:  Unknown     Medications: Scheduled: Continuous:  azithromycin (ZITHROMAX) 500 MG IVPB (Vial-Mate [employer]) 500 mg (11/11/21 1621)    Results for orders placed or performed during the hospital encounter of 11/11/21 (from the past 24 hour(s))  Comprehensive metabolic panel     Status: Abnormal   Collection Time: 11/11/21  1:12 PM  Result Value Ref Range   Sodium 139 135 - 145 mmol/L   Potassium 3.6 3.5 - 5.1 mmol/L  Chloride 106 98 - 111 mmol/L   CO2 26 22 - 32 mmol/L   Glucose, Bld 87 70 - 99 mg/dL   BUN 12 8 - 23 mg/dL   Creatinine, Ser 0.59 0.44 - 1.00 mg/dL   Calcium 9.4 8.9 - 10.3 mg/dL   Total Protein 6.3 (L) 6.5 - 8.1 g/dL   Albumin 3.4 (L) 3.5 - 5.0 g/dL   AST 19 15 - 41 U/L   ALT 14 0 - 44 U/L   Alkaline Phosphatase 75 38 - 126 U/L   Total Bilirubin 0.3 0.3 - 1.2 mg/dL   GFR, Estimated >60 >60 mL/min   Anion gap 7 5 - 15  Troponin I (High Sensitivity)     Status: None   Collection Time: 11/11/21  1:12 PM  Result Value Ref Range   Troponin I (High Sensitivity) 3 <18 ng/L  CBC with Differential     Status: Abnormal   Collection Time: 11/11/21  1:12 PM  Result Value Ref Range   WBC 4.8 4.0 - 10.5 K/uL   RBC 3.34 (L) 3.87 - 5.11 MIL/uL   Hemoglobin 9.5 (L) 12.0 - 15.0 g/dL   HCT 31.0 (L)  36.0 - 46.0 %   MCV 92.8 80.0 - 100.0 fL   MCH 28.4 26.0 - 34.0 pg   MCHC 30.6 30.0 - 36.0 g/dL   RDW 13.3 11.5 - 15.5 %   Platelets 371 150 - 400 K/uL   nRBC 0.0 0.0 - 0.2 %   Neutrophils Relative % 63 %   Neutro Abs 3.1 1.7 - 7.7 K/uL   Lymphocytes Relative 24 %   Lymphs Abs 1.2 0.7 - 4.0 K/uL   Monocytes Relative 11 %   Monocytes Absolute 0.5 0.1 - 1.0 K/uL   Eosinophils Relative 1 %   Eosinophils Absolute 0.1 0.0 - 0.5 K/uL   Basophils Relative 1 %   Basophils Absolute 0.0 0.0 - 0.1 K/uL   Immature Granulocytes 0 %   Abs Immature Granulocytes 0.01 0.00 - 0.07 K/uL  Urinalysis, Routine w reflex microscopic Urine, Clean Catch     Status: Abnormal   Collection Time: 11/11/21  1:12 PM  Result Value Ref Range   Color, Urine YELLOW YELLOW   APPearance CLEAR CLEAR   Specific Gravity, Urine 1.012 1.005 - 1.030   pH 7.0 5.0 - 8.0   Glucose, UA NEGATIVE NEGATIVE mg/dL   Hgb urine dipstick SMALL (A) NEGATIVE   Bilirubin Urine NEGATIVE NEGATIVE   Ketones, ur NEGATIVE NEGATIVE mg/dL   Protein, ur NEGATIVE NEGATIVE mg/dL   Nitrite NEGATIVE NEGATIVE   Leukocytes,Ua SMALL (A) NEGATIVE   RBC / HPF 6-10 0 - 5 RBC/hpf   WBC, UA 11-20 0 - 5 WBC/hpf   Bacteria, UA RARE (A) NONE SEEN   Squamous Epithelial / LPF 0-5 0 - 5   Mucus PRESENT    Ca Oxalate Crys, UA PRESENT   CBG monitoring, ED     Status: None   Collection Time: 11/11/21  1:17 PM  Result Value Ref Range   Glucose-Capillary 84 70 - 99 mg/dL   Comment 1 Notify RN    Comment 2 Document in Chart   POC occult blood, ED     Status: Abnormal   Collection Time: 11/11/21  2:49 PM  Result Value Ref Range   Fecal Occult Bld POSITIVE (A) NEGATIVE  Reticulocytes     Status: Abnormal   Collection Time: 11/11/21  2:51 PM  Result Value Ref Range  Retic Ct Pct 2.2 0.4 - 3.1 %   RBC. 3.25 (L) 3.87 - 5.11 MIL/uL   Retic Count, Absolute 71.2 19.0 - 186.0 K/uL   Immature Retic Fract 13.3 2.3 - 15.9 %  Lactic acid, plasma     Status: None    Collection Time: 11/11/21  3:05 PM  Result Value Ref Range   Lactic Acid, Venous 1.1 0.5 - 1.9 mmol/L     DG Chest 2 View  Result Date: 11/11/2021 CLINICAL DATA:  Weakness, diarrhea, not feeling well EXAM: CHEST - 2 VIEW COMPARISON:  09/16/2021 FINDINGS: Severe thoracic deformity secondary to scoliosis. Enlargement of cardiac silhouette. Atherosclerotic calcification aorta. Minimal LEFT basilar atelectasis. Question mild patchy infiltrate at RIGHT lung base. Remaining lungs clear. No pleural effusion or pneumothorax. Bones demineralized with severe dextroconvex thoracic scoliosis. IMPRESSION: Enlargement of cardiac silhouette with LEFT basilar atelectasis and questionable RIGHT base infiltrate. Electronically Signed   By: Lavonia Dana M.D.   On: 11/11/2021 13:40   CT HEAD WO CONTRAST (5MM)  Result Date: 11/11/2021 CLINICAL DATA:  Head trauma EXAM: CT HEAD WITHOUT CONTRAST TECHNIQUE: Contiguous axial images were obtained from the base of the skull through the vertex without intravenous contrast. RADIATION DOSE REDUCTION: This exam was performed according to the departmental dose-optimization program which includes automated exposure control, adjustment of the mA and/or kV according to patient size and/or use of iterative reconstruction technique. COMPARISON:  CT head 09/16/2021 FINDINGS: Brain: No acute intracranial hemorrhage, mass effect, or herniation. No extra-axial fluid collections. No evidence of acute territorial infarct. No hydrocephalus. 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 Extensive hypodensities throughout the periventricular and subcortical white matter, likely secondary to chronic microvascular ischemic changes. Vascular: Right vertebrobasilar vascular stent graft again visualized. Skull: No acute fracture identified. Previous right suboccipital craniectomy changes. Sinuses/Orbits: No acute finding. Other: None.  IMPRESSION: 1. No acute intracranial process identified. Chronic microvascular ischemic changes. 2. Stable appearance of the known right PICA aneurysm and surgical changes. Electronically Signed   By: Ofilia Neas M.D.   On: 11/11/2021 16:10   CT Cervical Spine Wo Contrast  Result Date: 11/11/2021 CLINICAL DATA:  Neck trauma, not feeling well EXAM: CT CERVICAL SPINE WITHOUT CONTRAST TECHNIQUE: Multidetector CT imaging of the cervical spine was performed without intravenous contrast. Multiplanar CT image reconstructions were also generated. RADIATION DOSE REDUCTION: This exam was performed according to the departmental dose-optimization program which includes automated exposure control, adjustment of the mA and/or kV according to patient size and/or use of iterative reconstruction technique. COMPARISON:  09/16/2021. FINDINGS: Alignment: Unchanged trace retrolisthesis of C3 on C4 and C4 on C5. Unchanged trace anterolisthesis C5 on C6. Skull base and vertebrae: Redemonstrated right vertebral artery stent. No acute fracture or suspicious osseous lesion. Soft tissues and spinal canal: No prevertebral fluid or swelling. No visible canal hematoma. Disc levels: Disc height loss, most prominently at C5-C6 and C6-C7, without significant spinal canal stenosis. Uncovertebral and facet arthropathy with up to moderate neural foraminal narrowing at multiple levels. Upper chest: Scarring in the right lung apex. No focal pulmonary opacity or pleural effusion. Other: None. IMPRESSION: 1.  No acute fracture or traumatic listhesis in the cervical spine. 2. For discussion of the known right PICA aneurysm and stent, please see same-day CT head. Electronically Signed   By: Merilyn Baba M.D.   On: 11/11/2021 16:16    ROS:  As stated above in the HPI otherwise negative.  Blood  pressure (!) 143/88, pulse 69, temperature 97.8 F (36.6 C), temperature source Oral, resp. rate 19, height 4\' 11"  (1.499 m), weight 39.5 kg, SpO2 98  %.    PE: Gen: NAD, Alert and Oriented HEENT:  [place]/AT, EOMI Neck: Supple, no LAD Lungs: CTA Bilaterally CV: RRR without M/G/R ABD: Soft, mild lower abdominal tenderness, +BS Ext: No C/C/E  Assessment/Plan: 1) IDA. 2) Heme positive stool. 3) Fatigue. 4) Mild lower abdominal pain - ? Etiology.   Her intermittent anemia is not a new problem for her.  Work up in the recent past did not yield a diagnosis.  The EGD in 2017 was positive for an LA Grade B esophagitis.  The SBE performed 02/2020 was negative for an esophagitis or any other bleeding lesions.  Plan: 1) VCE tomorrow. 2) NPO after midnight. 3) Monitor her abdominal pain.  She reports that she only noticed it today. Lauralynn Loeb D 11/11/2021, 4:21 PM

## 2021-11-11 NOTE — Assessment & Plan Note (Signed)
Currently on no antihypertensive medication, blood pressure seems to be relatively well controlled.

## 2021-11-11 NOTE — ED Provider Notes (Signed)
[facility] DEPT Provider Note   CSN: [id number] Arrival date & time: 11/11/21  1106     History  Chief Complaint  Patient presents with   Diarrhea   Weakness    Elaine Rivera is a 76 y.o. female.  HPI 76 year old female presents with not feeling well.  Essentially is complaining of generalized weakness.  It is hard to get a good history from the patient as she does not offer much but essentially she was not feeling well today and she states that the PCP at her facility told her she did not look as good as normal and so they sent her to the emergency department.  Patient reports that she chronically has on and off headaches due to prior cerebral aneurysm and is having a headache right now though is not worse than typical.  She states she has chronic right-sided weakness and is in a wheelchair.  However she feels like she is been getting stronger due to physical therapy and try to get out of bed yesterday but then fell and hit her neck.  She is having posterior neck pain.  She also endorses generalized weakness but no new unilateral weakness.  No fevers.  Chronically has a cough.  No chest pain, dyspnea, abdominal pain.  No urinary symptoms.  1 episode of diarrhea earlier today.  Feels like she might be dehydrated.  Home Medications Prior to Admission medications   Medication Sig Start Date End Date Taking? Authorizing Provider  levETIRAcetam (KEPPRA) 750 MG tablet Take 1 tablet (750 mg total) by mouth 2 (two) times daily. 10/26/21   Lorin Glass, PA-C  mirtazapine (REMERON) 7.5 MG tablet Take 1 tablet (7.5 mg total) by mouth at bedtime. 08/26/21   Shawna Clamp, MD      Allergies    Sulfa antibiotics    Review of Systems   Review of Systems  Constitutional:  Negative for fever.  Respiratory:  Positive for cough. Negative for shortness of breath.   Cardiovascular:  Negative for chest pain.  Gastrointestinal:  Positive for diarrhea. Negative  for abdominal pain and vomiting.  Musculoskeletal:  Positive for neck pain.  Neurological:  Positive for weakness and headaches.   Physical Exam Updated Vital Signs BP (!) 143/88    Pulse 70    Temp 97.8 F (36.6 C) (Oral)    Resp 16    Ht 4\' 11"  (1.499 m)    Wt 39.5 kg    SpO2 99%    BMI 17.57 kg/m  Physical Exam Vitals and nursing note reviewed. Exam conducted with a chaperone present.  Constitutional:      General: She is not in acute distress.    Appearance: She is well-developed. She is not ill-appearing or diaphoretic.  HENT:     Head: Normocephalic and atraumatic.     Mouth/Throat:     Mouth: Mucous membranes are dry.  Neck:   Cardiovascular:     Rate and Rhythm: Normal rate and regular rhythm.     Heart sounds: Normal heart sounds.  Pulmonary:     Effort: Pulmonary effort is normal.     Breath sounds: Normal breath sounds.  Abdominal:     Palpations: Abdomen is soft.     Tenderness: There is no abdominal tenderness.  Genitourinary:    Comments: Brown stool on DRE that's strongly heme-positive. There is black residue near anus prior to exam concerning for melena Musculoskeletal:     Cervical back: Neck supple. Muscular tenderness  present.  Skin:    General: Skin is warm and dry.  Neurological:     Mental Status: She is alert.     Comments: Slight right sided extremity weakness compared to left. Overall is diffusely weak. Otherwise has clear speech. No facial droop. Oriented to person, place, time and situation.    ED Results / Procedures / Treatments   Labs (all labs ordered are listed, but only abnormal results are displayed) Labs Reviewed  COMPREHENSIVE METABOLIC PANEL - Abnormal; Notable for the following components:      Result Value   Total Protein 6.3 (*)    Albumin 3.4 (*)    All other components within normal limits  CBC WITH DIFFERENTIAL/PLATELET - Abnormal; Notable for the following components:   RBC 3.34 (*)    Hemoglobin 9.5 (*)    HCT 31.0 (*)     All other components within normal limits  URINALYSIS, ROUTINE W REFLEX MICROSCOPIC - Abnormal; Notable for the following components:   Hgb urine dipstick SMALL (*)    Leukocytes,Ua SMALL (*)    Bacteria, UA RARE (*)    All other components within normal limits  POC OCCULT BLOOD, ED - Abnormal; Notable for the following components:   Fecal Occult Bld POSITIVE (*)    All other components within normal limits  RESP PANEL BY RT-PCR (FLU A&B, COVID) ARPGX2  CULTURE, BLOOD (ROUTINE X 2)  CULTURE, BLOOD (ROUTINE X 2)  VITAMIN B12  FOLATE  IRON AND TIBC  FERRITIN  RETICULOCYTES  LACTIC ACID, PLASMA  LACTIC ACID, PLASMA  CBG MONITORING, ED  TROPONIN I (HIGH SENSITIVITY)  TROPONIN I (HIGH SENSITIVITY)    EKG EKG Interpretation  Date/Time:  Tuesday November 11 2021 12:33:51 EST Ventricular Rate:  75 PR Interval:  146 QRS Duration: 66 QT Interval:  390 QTC Calculation: 435 R Axis:   -53 Text Interpretation: Normal sinus rhythm Left anterior fascicular block Nonspecific ST and T wave abnormality overall similar to Jan 2023 Confirmed by Sherwood Gambler [phone]) on 11/11/2021 1:30:16 PM  Radiology DG Chest 2 View  Result Date: 11/11/2021 CLINICAL DATA:  Weakness, diarrhea, not feeling well EXAM: CHEST - 2 VIEW COMPARISON:  09/16/2021 FINDINGS: Severe thoracic deformity secondary to scoliosis. Enlargement of cardiac silhouette. Atherosclerotic calcification aorta. Minimal LEFT basilar atelectasis. Question mild patchy infiltrate at RIGHT lung base. Remaining lungs clear. No pleural effusion or pneumothorax. Bones demineralized with severe dextroconvex thoracic scoliosis. IMPRESSION: Enlargement of cardiac silhouette with LEFT basilar atelectasis and questionable RIGHT base infiltrate. Electronically Signed   By: Lavonia Dana M.D.   On: 11/11/2021 13:40    Procedures Procedures    Medications Ordered in ED Medications  pantoprazole (PROTONIX) injection 40 mg (has no administration in  time range)  cefTRIAXone (ROCEPHIN) 1 g in sodium chloride 0.9 % 100 mL IVPB (has no administration in time range)  azithromycin (ZITHROMAX) 500 mg in sodium chloride 0.9 % 250 mL IVPB (has no administration in time range)  lactated ringers bolus 1,000 mL (1,000 mLs Intravenous New Bag/Given 11/11/21 1349)    ED Course/ Medical Decision Making/ A&P                           Medical Decision Making Amount and/or Complexity of Data Reviewed Labs: ordered. Radiology: ordered.  Risk Prescription drug management.   Patient appears to have generalized weakness from acute on chronic anemia. ECG shows no acute ischemia. Hgb is 9 today, was 11 2 weeks  ago. Is heme positive. Chart review shows that in 2017 she had an endoscopy that was positive for esophagitis. Will give protonix here. CMP benign here. VSS. Given fall, will get head/neck CT. After these will need admission. Care to Dr. Darl Householder.        Final Clinical Impression(s) / ED Diagnoses Final diagnoses:  None    Rx / DC Orders ED Discharge Orders     None         Sherwood Gambler, MD 11/11/21 1517

## 2021-11-11 NOTE — ED Triage Notes (Signed)
Per EMS-coming from [facility] states she does not feel well-patient was seen by facility MD-daughter called EMS-recently started on Tramadol on 1/3 and later discontinued on 2/2-1 episode of diarrhea-

## 2021-11-11 NOTE — ED Provider Notes (Signed)
°  Physical Exam  BP 139/81    Pulse 69    Temp 97.8 F (36.6 C) (Oral)    Resp 20    Ht 4\' 11"  (1.499 m)    Wt 39.5 kg    SpO2 96%    BMI 17.57 kg/m   Physical Exam  Procedures  Procedures  ED Course / MDM    Medical Decision Making Patient is here with weakness.  Care assumed at signout at 3 PM.  Patient dropped her hemoglobin to 9.5 from 12.  Patient is guaiac positive and had melena.  Signout pending CT head and admission.  4:28 PM Patient was given Protonix.  I also discussed case with Dr. Benson Norway who will see her.  Patient also has UTI and pneumonia as well so I ordered Rocephin and azithromycin.  At this point will admit patient for UTI, pneumonia, GI bleed.  Problems Addressed: Community acquired pneumonia, unspecified laterality: acute illness or injury Gastrointestinal hemorrhage, unspecified gastrointestinal hemorrhage type: acute illness or injury Urinary tract infection without hematuria, site unspecified: acute illness or injury  Amount and/or Complexity of Data Reviewed External Data Reviewed: notes. Labs: ordered. Decision-making details documented in ED Course. Radiology: ordered and independent interpretation performed. Decision-making details documented in ED Course.  Risk Prescription drug management.          Drenda Freeze, MD 11/11/21 1630

## 2021-11-11 NOTE — Assessment & Plan Note (Signed)
Likely due to GI losses Monitor hemoglobin every 12 hours. We will type and screen.  GI has already been consulted .

## 2021-11-11 NOTE — Assessment & Plan Note (Signed)
Continue Keppra.

## 2021-11-11 NOTE — Assessment & Plan Note (Addendum)
Rectal exam by the ED physician showed melanotic stools We will go ahead and start her on IV Protonix twice a day. The ED physician has already consulted Dr. Janan Halter who will see the patient. Will  place her n.p.o. started on IV fluids. Check and anemia panel. Check hemoglobin every 12 hours. He appears dehydrated so I expect mild drop in hemoglobin after hydration. He has endoscopy by Dr. Collene Mares that showed esophagitis in 2017. Chest x-ray showed possible bilateral atelectasis the patient denies any cough shortness of breath any fever no leukocytosis discontinue antibiotics. Her UA does not show any signs of infection.

## 2021-11-11 NOTE — ED Notes (Signed)
Unable to obtain 2nd set of blood cultures due to difficult stick.  

## 2021-11-11 NOTE — Consult Note (Signed)
Reason for Consult:Anemia, Heme positive stool Referring Physician: [facility]  Elaine Rivera HPI: This is a 76 year old female with a PMH of an LA Grade B esophagitis and seizures admitted for anemia and heme positive stool.  There some report of melena, but she denies seeing any black stools.  However, does admit that she was not paying attention.  She was noted to be off her baseline when she was seeing her PCP today.  The patient was sent to the ER and she was noted to be anemic with an HGB of 9.5 g/dL, which was a drop from 11.4 g/dL on 10/26/2021.  She was last evaluated on 03/01/2020 for anemia with an enteroscopy, which only showed a 5 cm hiatal hernia.  Her last colonoscopy was in 2018 with Dr. Collene Mares with findings of a small adenoma.  Past Medical History:  Diagnosis Date   Anemia    years ago   Arthritis    Basal cell carcinoma of eye    biopsy of left eye/ non cancerous   Complication of anesthesia    nausea/vomiting   Depression    GERD (gastroesophageal reflux disease)    History of kidney stones    Hypertension    no longer on medications   Seizures ([place])    Manifesting with aphasia    Past Surgical History:  Procedure Laterality Date   CEREBRAL ANEURYSM REPAIR     1998   COLONOSCOPY     ECTOPIC PREGNANCY SURGERY     x2    ENTEROSCOPY N/A 03/01/2020   Procedure: ENTEROSCOPY;  Surgeon: Carol Ada, MD;  Location: [facility];  Service: Endoscopy;  Laterality: N/A;   ESOPHAGOGASTRODUODENOSCOPY N/A 04/22/2016   Procedure: ESOPHAGOGASTRODUODENOSCOPY (EGD);  Surgeon: Juanita Craver, MD;  Location: [facility];  Service: Endoscopy;  Laterality: N/A;   IR 3D INDEPENDENT WKST  09/05/2018   IR ANGIO INTRA EXTRACRAN SEL COM CAROTID INNOMINATE BILAT MOD SED  09/05/2018   IR ANGIO VERTEBRAL SEL VERTEBRAL UNI R MOD SED  09/05/2018   IR ANGIOGRAM FOLLOW UP STUDY  09/05/2018   IR RADIOLOGIST EVAL & MGMT  03/28/2021   IR TRANSCATH/EMBOLIZ  09/05/2018   PARATHYROIDECTOMY     2002    RADIOLOGY WITH ANESTHESIA N/A 09/05/2018   Procedure: EMBOLIZATION;  Surgeon: Radiologist, Medication, MD;  Location: [facility];  Service: Radiology;  Laterality: N/A;   TONSILLECTOMY     1953   VAGINAL HYSTERECTOMY     2001   VOCAL CORD LATERALIZATION, ENDOSCOPIC APPROACH W/ MLB     2007 (@[facility] )   WRIST SURGERY     left side/2008   WRIST SURGERY     right side/ 2002    Family History  Family history unknown: Yes    Social History:  reports that she has never smoked. She has never used smokeless tobacco. She reports that she does not drink alcohol and does not use drugs.  Allergies:  Allergies  Allergen Reactions   Sulfa Antibiotics Other (See Comments)    Reaction:  Unknown     Medications: Scheduled: Continuous:  azithromycin (ZITHROMAX) 500 MG IVPB (Vial-Mate [employer]) 500 mg (11/11/21 1621)    Results for orders placed or performed during the hospital encounter of 11/11/21 (from the past 24 hour(s))  Comprehensive metabolic panel     Status: Abnormal   Collection Time: 11/11/21  1:12 PM  Result Value Ref Range   Sodium 139 135 - 145 mmol/L   Potassium 3.6 3.5 - 5.1 mmol/L  Chloride 106 98 - 111 mmol/L   CO2 26 22 - 32 mmol/L   Glucose, Bld 87 70 - 99 mg/dL   BUN 12 8 - 23 mg/dL   Creatinine, Ser 0.59 0.44 - 1.00 mg/dL   Calcium 9.4 8.9 - 10.3 mg/dL   Total Protein 6.3 (L) 6.5 - 8.1 g/dL   Albumin 3.4 (L) 3.5 - 5.0 g/dL   AST 19 15 - 41 U/L   ALT 14 0 - 44 U/L   Alkaline Phosphatase 75 38 - 126 U/L   Total Bilirubin 0.3 0.3 - 1.2 mg/dL   GFR, Estimated >60 >60 mL/min   Anion gap 7 5 - 15  Troponin I (High Sensitivity)     Status: None   Collection Time: 11/11/21  1:12 PM  Result Value Ref Range   Troponin I (High Sensitivity) 3 <18 ng/L  CBC with Differential     Status: Abnormal   Collection Time: 11/11/21  1:12 PM  Result Value Ref Range   WBC 4.8 4.0 - 10.5 K/uL   RBC 3.34 (L) 3.87 - 5.11 MIL/uL   Hemoglobin 9.5 (L) 12.0 - 15.0 g/dL   HCT 31.0 (L)  36.0 - 46.0 %   MCV 92.8 80.0 - 100.0 fL   MCH 28.4 26.0 - 34.0 pg   MCHC 30.6 30.0 - 36.0 g/dL   RDW 13.3 11.5 - 15.5 %   Platelets 371 150 - 400 K/uL   nRBC 0.0 0.0 - 0.2 %   Neutrophils Relative % 63 %   Neutro Abs 3.1 1.7 - 7.7 K/uL   Lymphocytes Relative 24 %   Lymphs Abs 1.2 0.7 - 4.0 K/uL   Monocytes Relative 11 %   Monocytes Absolute 0.5 0.1 - 1.0 K/uL   Eosinophils Relative 1 %   Eosinophils Absolute 0.1 0.0 - 0.5 K/uL   Basophils Relative 1 %   Basophils Absolute 0.0 0.0 - 0.1 K/uL   Immature Granulocytes 0 %   Abs Immature Granulocytes 0.01 0.00 - 0.07 K/uL  Urinalysis, Routine w reflex microscopic Urine, Clean Catch     Status: Abnormal   Collection Time: 11/11/21  1:12 PM  Result Value Ref Range   Color, Urine YELLOW YELLOW   APPearance CLEAR CLEAR   Specific Gravity, Urine 1.012 1.005 - 1.030   pH 7.0 5.0 - 8.0   Glucose, UA NEGATIVE NEGATIVE mg/dL   Hgb urine dipstick SMALL (A) NEGATIVE   Bilirubin Urine NEGATIVE NEGATIVE   Ketones, ur NEGATIVE NEGATIVE mg/dL   Protein, ur NEGATIVE NEGATIVE mg/dL   Nitrite NEGATIVE NEGATIVE   Leukocytes,Ua SMALL (A) NEGATIVE   RBC / HPF 6-10 0 - 5 RBC/hpf   WBC, UA 11-20 0 - 5 WBC/hpf   Bacteria, UA RARE (A) NONE SEEN   Squamous Epithelial / LPF 0-5 0 - 5   Mucus PRESENT    Ca Oxalate Crys, UA PRESENT   CBG monitoring, ED     Status: None   Collection Time: 11/11/21  1:17 PM  Result Value Ref Range   Glucose-Capillary 84 70 - 99 mg/dL   Comment 1 Notify RN    Comment 2 Document in Chart   POC occult blood, ED     Status: Abnormal   Collection Time: 11/11/21  2:49 PM  Result Value Ref Range   Fecal Occult Bld POSITIVE (A) NEGATIVE  Reticulocytes     Status: Abnormal   Collection Time: 11/11/21  2:51 PM  Result Value Ref Range  Retic Ct Pct 2.2 0.4 - 3.1 %   RBC. 3.25 (L) 3.87 - 5.11 MIL/uL   Retic Count, Absolute 71.2 19.0 - 186.0 K/uL   Immature Retic Fract 13.3 2.3 - 15.9 %  Lactic acid, plasma     Status: None    Collection Time: 11/11/21  3:05 PM  Result Value Ref Range   Lactic Acid, Venous 1.1 0.5 - 1.9 mmol/L     DG Chest 2 View  Result Date: 11/11/2021 CLINICAL DATA:  Weakness, diarrhea, not feeling well EXAM: CHEST - 2 VIEW COMPARISON:  09/16/2021 FINDINGS: Severe thoracic deformity secondary to scoliosis. Enlargement of cardiac silhouette. Atherosclerotic calcification aorta. Minimal LEFT basilar atelectasis. Question mild patchy infiltrate at RIGHT lung base. Remaining lungs clear. No pleural effusion or pneumothorax. Bones demineralized with severe dextroconvex thoracic scoliosis. IMPRESSION: Enlargement of cardiac silhouette with LEFT basilar atelectasis and questionable RIGHT base infiltrate. Electronically Signed   By: Lavonia Dana M.D.   On: 11/11/2021 13:40   CT HEAD WO CONTRAST (5MM)  Result Date: 11/11/2021 CLINICAL DATA:  Head trauma EXAM: CT HEAD WITHOUT CONTRAST TECHNIQUE: Contiguous axial images were obtained from the base of the skull through the vertex without intravenous contrast. RADIATION DOSE REDUCTION: This exam was performed according to the departmental dose-optimization program which includes automated exposure control, adjustment of the mA and/or kV according to patient size and/or use of iterative reconstruction technique. COMPARISON:  CT head 09/16/2021 FINDINGS: Brain: No acute intracranial hemorrhage, mass effect, or herniation. No extra-axial fluid collections. No evidence of acute territorial infarct. No hydrocephalus. 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 Extensive hypodensities throughout the periventricular and subcortical white matter, likely secondary to chronic microvascular ischemic changes. Vascular: Right vertebrobasilar vascular stent graft again visualized. Skull: No acute fracture identified. Previous right suboccipital craniectomy changes. Sinuses/Orbits: No acute finding. Other: None.  IMPRESSION: 1. No acute intracranial process identified. Chronic microvascular ischemic changes. 2. Stable appearance of the known right PICA aneurysm and surgical changes. Electronically Signed   By: Ofilia Neas M.D.   On: 11/11/2021 16:10   CT Cervical Spine Wo Contrast  Result Date: 11/11/2021 CLINICAL DATA:  Neck trauma, not feeling well EXAM: CT CERVICAL SPINE WITHOUT CONTRAST TECHNIQUE: Multidetector CT imaging of the cervical spine was performed without intravenous contrast. Multiplanar CT image reconstructions were also generated. RADIATION DOSE REDUCTION: This exam was performed according to the departmental dose-optimization program which includes automated exposure control, adjustment of the mA and/or kV according to patient size and/or use of iterative reconstruction technique. COMPARISON:  09/16/2021. FINDINGS: Alignment: Unchanged trace retrolisthesis of C3 on C4 and C4 on C5. Unchanged trace anterolisthesis C5 on C6. Skull base and vertebrae: Redemonstrated right vertebral artery stent. No acute fracture or suspicious osseous lesion. Soft tissues and spinal canal: No prevertebral fluid or swelling. No visible canal hematoma. Disc levels: Disc height loss, most prominently at C5-C6 and C6-C7, without significant spinal canal stenosis. Uncovertebral and facet arthropathy with up to moderate neural foraminal narrowing at multiple levels. Upper chest: Scarring in the right lung apex. No focal pulmonary opacity or pleural effusion. Other: None. IMPRESSION: 1.  No acute fracture or traumatic listhesis in the cervical spine. 2. For discussion of the known right PICA aneurysm and stent, please see same-day CT head. Electronically Signed   By: Merilyn Baba M.D.   On: 11/11/2021 16:16    ROS:  As stated above in the HPI otherwise negative.  Blood  pressure (!) 143/88, pulse 69, temperature 97.8 F (36.6 C), temperature source Oral, resp. rate 19, height 4\' 11"  (1.499 m), weight 39.5 kg, SpO2 98  %.    PE: Gen: NAD, Alert and Oriented HEENT:  [place]/AT, EOMI Neck: Supple, no LAD Lungs: CTA Bilaterally CV: RRR without M/G/R ABD: Soft, mild lower abdominal tenderness, +BS Ext: No C/C/E  Assessment/Plan: 1) IDA. 2) Heme positive stool. 3) Fatigue. 4) Mild lower abdominal pain - ? Etiology.   Her intermittent anemia is not a new problem for her.  Work up in the recent past did not yield a diagnosis.  The EGD in 2017 was positive for an LA Grade B esophagitis.  The SBE performed 02/2020 was negative for an esophagitis or any other bleeding lesions.  Plan: 1) VCE tomorrow. 2) NPO after midnight. 3) Monitor her abdominal pain.  She reports that she only noticed it today. Gunnard Dorrance D 11/11/2021, 4:21 PM

## 2021-11-12 ENCOUNTER — Encounter (HOSPITAL_COMMUNITY)
Admission: EM | Disposition: A | Discharge status: Skilled Nursing Facility | Payer: Self-pay | Source: Skilled Nursing Facility | Attending: Internal Medicine

## 2021-11-12 ENCOUNTER — Encounter (HOSPITAL_COMMUNITY): Payer: Self-pay | Admitting: Internal Medicine

## 2021-11-12 ENCOUNTER — Inpatient Hospital Stay (HOSPITAL_COMMUNITY): Payer: Medicare Other | Admitting: Anesthesiology

## 2021-11-12 DIAGNOSIS — K922 Gastrointestinal hemorrhage, unspecified: Secondary | ICD-10-CM | POA: Diagnosis not present

## 2021-11-12 DIAGNOSIS — K2970 Gastritis, unspecified, without bleeding: Secondary | ICD-10-CM

## 2021-11-12 DIAGNOSIS — K222 Esophageal obstruction: Secondary | ICD-10-CM | POA: Diagnosis not present

## 2021-11-12 DIAGNOSIS — T18198A Other foreign object in esophagus causing other injury, initial encounter: Secondary | ICD-10-CM

## 2021-11-12 DIAGNOSIS — K449 Diaphragmatic hernia without obstruction or gangrene: Secondary | ICD-10-CM | POA: Diagnosis not present

## 2021-11-12 HISTORY — PX: GIVENS CAPSULE STUDY: SHX5432

## 2021-11-12 HISTORY — PX: ESOPHAGOGASTRODUODENOSCOPY (EGD) WITH PROPOFOL: SHX5813

## 2021-11-12 LAB — CBC WITH DIFFERENTIAL/PLATELET
Abs Immature Granulocytes: 0.04 10*3/uL (ref 0.00–0.07)
Basophils Absolute: 0 10*3/uL (ref 0.0–0.1)
Basophils Relative: 0 %
Eosinophils Absolute: 0 10*3/uL (ref 0.0–0.5)
Eosinophils Relative: 0 %
HCT: 32.6 % — ABNORMAL LOW (ref 36.0–46.0)
Hemoglobin: 9.9 g/dL — ABNORMAL LOW (ref 12.0–15.0)
Immature Granulocytes: 0 %
Lymphocytes Relative: 12 %
Lymphs Abs: 1.1 10*3/uL (ref 0.7–4.0)
MCH: 28.7 pg (ref 26.0–34.0)
MCHC: 30.4 g/dL (ref 30.0–36.0)
MCV: 94.5 fL (ref 80.0–100.0)
Monocytes Absolute: 0.5 10*3/uL (ref 0.1–1.0)
Monocytes Relative: 5 %
Neutro Abs: 8 10*3/uL — ABNORMAL HIGH (ref 1.7–7.7)
Neutrophils Relative %: 83 %
Platelets: 317 10*3/uL (ref 150–400)
RBC: 3.45 MIL/uL — ABNORMAL LOW (ref 3.87–5.11)
RDW: 13.2 % (ref 11.5–15.5)
WBC: 9.8 10*3/uL (ref 4.0–10.5)
nRBC: 0 % (ref 0.0–0.2)

## 2021-11-12 LAB — COMPREHENSIVE METABOLIC PANEL
ALT: 13 U/L (ref 0–44)
AST: 18 U/L (ref 15–41)
Albumin: 3.2 g/dL — ABNORMAL LOW (ref 3.5–5.0)
Alkaline Phosphatase: 69 U/L (ref 38–126)
Anion gap: 7 (ref 5–15)
BUN: 11 mg/dL (ref 8–23)
CO2: 22 mmol/L (ref 22–32)
Calcium: 9 mg/dL (ref 8.9–10.3)
Chloride: 107 mmol/L (ref 98–111)
Creatinine, Ser: 0.65 mg/dL (ref 0.44–1.00)
GFR, Estimated: >60 mL/min (ref >60)
Glucose, Bld: 146 mg/dL — ABNORMAL HIGH (ref 70–99)
Potassium: 4 mmol/L (ref 3.5–5.1)
Sodium: 136 mmol/L (ref 135–145)
Total Bilirubin: 0.5 mg/dL (ref 0.3–1.2)
Total Protein: 6 g/dL — ABNORMAL LOW (ref 6.5–8.1)

## 2021-11-12 LAB — HEMOGLOBIN
Hemoglobin: 11.3 g/dL — ABNORMAL LOW (ref 12.0–15.0)
Hemoglobin: 10.2 g/dL — ABNORMAL LOW (ref 12.0–15.0)

## 2021-11-12 SURGERY — ESOPHAGOGASTRODUODENOSCOPY (EGD) WITH PROPOFOL
Anesthesia: Monitor Anesthesia Care

## 2021-11-12 SURGERY — IMAGING PROCEDURE, GI TRACT, INTRALUMINAL, VIA CAPSULE
Anesthesia: LOCAL

## 2021-11-12 MED ORDER — LACTATED RINGERS IV SOLN: INTRAVENOUS | Status: DC | PRN

## 2021-11-12 MED ORDER — SODIUM CHLORIDE 0.9 % IV SOLN: INTRAVENOUS | Status: DC

## 2021-11-12 MED ORDER — LIDOCAINE 2% (20 MG/ML) 5 ML SYRINGE
INTRAMUSCULAR | Status: DC | PRN
  Administered 2021-11-12: 60 mg via INTRAVENOUS

## 2021-11-12 MED ORDER — LIDOCAINE 5 % EX PTCH
1.0000 | MEDICATED_PATCH | CUTANEOUS | Status: AC
  Administered 2021-11-12: 1 via TRANSDERMAL
  Filled 2021-11-12: qty 1

## 2021-11-12 MED ORDER — PROPOFOL 10 MG/ML IV BOLUS
INTRAVENOUS | Status: DC | PRN
Start: 2021-11-12 — End: 2021-11-12
  Administered 2021-11-12 (×4): 20 mg via INTRAVENOUS

## 2021-11-12 MED ORDER — ACETAMINOPHEN 325 MG PO TABS
650.0000 mg | ORAL_TABLET | Freq: Four times a day (QID) | ORAL | Status: AC | PRN
  Administered 2021-11-12 – 2021-11-13 (×2): 650 mg via ORAL
  Filled 2021-11-12 (×2): qty 2

## 2021-11-12 MED ORDER — INFLUENZA VAC A&B SA ADJ QUAD 0.5 ML IM PRSY
0.5000 mL | PREFILLED_SYRINGE | INTRAMUSCULAR | Status: DC
Start: 2021-11-13 — End: 2021-11-14
  Filled 2021-11-12: qty 0.5

## 2021-11-12 MED ORDER — SODIUM CHLORIDE 0.9 % IV SOLN
750.0000 mg | Freq: Two times a day (BID) | INTRAVENOUS | Status: DC
  Administered 2021-11-12 – 2021-11-13 (×3): 750 mg via INTRAVENOUS
  Filled 2021-11-12 (×4): qty 7.5

## 2021-11-12 MED ORDER — PNEUMOCOCCAL VAC POLYVALENT 25 MCG/0.5ML IJ INJ
0.5000 mL | INJECTION | INTRAMUSCULAR | Status: DC
  Filled 2021-11-12: qty 0.5

## 2021-11-12 MED ORDER — LIP MEDEX EX OINT
1.0000 | TOPICAL_OINTMENT | CUTANEOUS | Status: DC | PRN
Start: 2021-11-12 — End: 2021-11-14
  Filled 2021-11-12: qty 7

## 2021-11-12 SURGICAL SUPPLY — 15 items

## 2021-11-12 SURGICAL SUPPLY — 1 items: TOWEL COTTON PACK 4EA (MISCELLANEOUS) ×4 IMPLANT

## 2021-11-12 NOTE — Transfer of Care (Signed)
Immediate Anesthesia Transfer of Care Note  Patient: Elaine Rivera  Procedure(s) Performed: Procedure(s): ESOPHAGOGASTRODUODENOSCOPY (EGD) WITH PROPOFOL (N/A)  Patient Location: PACU  Anesthesia Type:MAC  Level of Consciousness:  sedated, patient cooperative and responds to stimulation  Airway & Oxygen Therapy:Patient Spontanous Breathing and Patient connected to face mask oxgen  Post-op Assessment:  Report given to PACU RN and Post -op Vital signs reviewed and stable  Post vital signs:  Reviewed and stable  Last Vitals:  Vitals:   11/12/21 0502 11/12/21 1144  BP: 113/77 104/61  Pulse: (!) 108 79  Resp: 16 17  Temp: 37.1 C 36.5 C  SpO2: 14% 70%    Complications: No apparent anesthesia complications

## 2021-11-12 NOTE — Anesthesia Preprocedure Evaluation (Addendum)
Anesthesia Evaluation  Patient identified by MRN, date of birth, ID band Patient awake    Reviewed: Allergy & Precautions, H&P , NPO status , Patient's Chart, lab work & pertinent test results  Airway Mallampati: III  TM Distance: >3 FB Neck ROM: Full    Dental no notable dental hx. (+) Teeth Intact, Dental Advisory Given   Pulmonary neg pulmonary ROS,    Pulmonary exam normal breath sounds clear to auscultation       Cardiovascular hypertension,  Rhythm:Regular Rate:Normal     Neuro/Psych  Headaches, Seizures -, Well Controlled,  Anxiety Depression    GI/Hepatic Neg liver ROS, GERD  Medicated,  Endo/Other  negative endocrine ROS  Renal/GU negative Renal ROS  negative genitourinary   Musculoskeletal  (+) Arthritis , Osteoarthritis,    Abdominal   Peds  Hematology  (+) Blood dyscrasia, anemia ,   Anesthesia Other Findings   Reproductive/Obstetrics negative OB ROS                            Anesthesia Physical Anesthesia Plan  ASA: 2  Anesthesia Plan: MAC   Post-op Pain Management: Minimal or no pain anticipated   Induction: Intravenous, Rapid sequence and Cricoid pressure planned  PONV Risk Score and Plan: 2 and Ondansetron, Treatment may vary due to age or medical condition and Propofol infusion  Airway Management Planned: Natural Airway and Simple Face Mask  Additional Equipment:   Intra-op Plan:   Post-operative Plan:   Informed Consent: I have reviewed the patients History and Physical, chart, labs and discussed the procedure including the risks, benefits and alternatives for the proposed anesthesia with the patient or authorized representative who has indicated his/her understanding and acceptance.     Dental advisory given  Plan Discussed with: CRNA  Anesthesia Plan Comments:        Anesthesia Quick Evaluation

## 2021-11-12 NOTE — Interval H&P Note (Signed)
History and Physical Interval Note:  11/12/2021 12:11 PM  Elaine Rivera  has presented today for surgery, with the diagnosis of dysphagia, pill cam capsule.  The various methods of treatment have been discussed with the patient and family. After consideration of risks, benefits and other options for treatment, the patient has consented to  Procedure(s): ESOPHAGOGASTRODUODENOSCOPY (EGD) WITH PROPOFOL (N/A) as a surgical intervention.  The patient's history has been reviewed, patient examined, no change in status, stable for surgery.  I have reviewed the patient's chart and labs.  Questions were answered to the patient's satisfaction.    The images on the from the recorder pack were reviewed.  It shows that the capsule is in the distal esophagus.   Duff Pozzi D

## 2021-11-12 NOTE — Progress Notes (Signed)
PROGRESS NOTE    Elaine Rivera  GDJ:[id number] DOB: 1946/01/24 DOA: 11/11/2021 PCP: System, Provider Not In     Brief Narrative:  76 year old female with past medical history of seizures and aphasia, she had an EGD by Dr. Collene Mares in 2017 that showed esophagitis who resides at skilled nursing facility relates she was not feeling well in general the patient cannot provide a good history and she does not offer any essential insight of why she is here she just relates that state her PCP at the facility told her she does not look good so recommended for her to come in to the ED she denies any fever, cough, shortness of breath, nausea, vomiting, diarrhea no abdominal pain no asymmetric weakness no urinary symptoms.  According to the patient she relates she fell to the floor and hit her neck  In the ED: He was found to be afebrile vitals stable, hemoglobin of 9 (previous hemoglobin last month in 2023 was 11.4) CT of the head showed no acute intracranial changes chronic microvascular ischemic changes, CT of the spine showed no acute fracture listhesis or dislocation.  Chest x-ray showed enlarged cardiac silhouette and right and left basilar atelectasis.  In the ED rectal exam was done that showed melanotic stools   Subjective:  Patient seen in the morning  I have Reviewed nursing notes, Vitals, and Lab results since pt's last encounter. Pertinent lab results CBC, BMP,  She seems to have a hard time to swallow VCE I have contacted GI urgently this morning  Assessment & Plan:  Principal Problem:   Acute lower GI bleeding Active Problems:   HTN (hypertension)   Protein-calorie malnutrition, severe   Seizure disorder (HCC)   Acute blood loss anemia   Melena    Assessment and Plan:  * Acute lower GI bleeding/acute blood loss anemia Rectal exam by the ED physician showed melanotic stools  IV Protonix twice a day. We will follow GI Dr. Benson Norway recommendation    Chest x-ray showed possible  bilateral atelectasis the patient denies any cough shortness of breath any fever no leukocytosis  Her UA does not show any signs of infection. discontinue antibiotics.  HTN (hypertension)- (present on admission) Currently on no antihypertensive medication, blood pressure seems to be relatively well controlled.  Seizure disorder ([place]) Change oral Keppra to IV  Protein-calorie malnutrition, severe- (present on admission) Ensure 3 times daily counseling. Underweight: Body mass index is 17.57 kg/m..    FTT: Will need PT eval once medically stable     I ordered the following labs:  Unresulted Labs (From admission, onward)     Start     Ordered   11/11/21 1716  Hemoglobin  Now then every 12 hours,   R (with TIMED occurrences)      11/11/21 1715             DVT prophylaxis: SCDs Start: 11/11/21 1716   Code Status:   Code Status: Full Code  Family Communication: Friend at bedside Disposition:   Status is: Inpatient  Dispo: The patient is from: ALF              Anticipated d/c is to: TBD              Anticipated d/c date is: TBD  Antimicrobials:   Anti-infectives (From admission, onward)    Start     Dose/Rate Route Frequency Ordered Stop   11/11/21 1515  cefTRIAXone (ROCEPHIN) 1 g in sodium chloride 0.9 % 100 mL  IVPB        1 g 200 mL/hr over 30 Minutes Intravenous  Once 11/11/21 1505 11/11/21 1603   11/11/21 1515  azithromycin (ZITHROMAX) 500 mg in sodium chloride 0.9 % 250 mL IVPB        500 mg 250 mL/hr over 60 Minutes Intravenous  Once 11/11/21 1505 11/11/21 1724           Objective: Vitals:   11/12/21 0502 11/12/21 1144 11/12/21 1240 11/12/21 1255  BP: 113/77 104/61 (!) 172/68 131/68  Pulse: (!) 108 79  96  Resp: 16 17 (!) 25 (!) 21  Temp: 98.7 F (37.1 C) 97.7 F (36.5 C)    TempSrc: Oral Temporal    SpO2: 93% 91% 97% 98%  Weight:      Height:        Intake/Output Summary (Last 24 hours) at 11/12/2021 2022 Last data filed at 11/12/2021  5035 Gross per 24 hour  Intake 2000.54 ml  Output 404 ml  Net 1596.54 ml   Filed Weights   11/11/21 1123  Weight: 39.5 kg    Examination:  General exam: Thin, malnourished, very frail, appear in acute distress due to not able to swallow VCE camera, she is spitting up saliva, she is shaky Respiratory system: Clear to auscultation. Respiratory effort normal. Cardiovascular system:  RRR.  Gastrointestinal system: Abdomen is nondistended, soft and nontender.  Normal bowel sounds heard. Central nervous system: Alert and oriented.  Anxious, does not appear comfortable. Extremities:  no edema Skin: No rashes, lesions or ulcers Psychiatry: Anxious.     Data Reviewed: I have personally reviewed  labs and visualized  imaging studies since the last encounter and formulate the plan        Scheduled Meds:  cyanocobalamin  1,000 mcg Intramuscular Daily   folic acid  1 mg Oral Daily   [START ON 11/13/2021] influenza vaccine adjuvanted  0.5 mL Intramuscular Tomorrow-1000   lidocaine  1 patch Transdermal Q24H   mirtazapine  7.5 mg Oral QHS   pantoprazole (PROTONIX) IV  40 mg Intravenous Q12H   [START ON 11/13/2021] pneumococcal 23 valent vaccine  0.5 mL Intramuscular Tomorrow-1000   Continuous Infusions:  dexrose 5 % and 0.45 % NaCl with KCl 30 mEq/L 10 mL/hr at 11/12/21 1722   levETIRAcetam 750 mg (11/12/21 1330)     LOS: 1 day   Time spent: mins Time spent on coordination of care, case discussed with transitional care team,  RN and consultant GI Dr. Benson Norway and  patient     Florencia Reasons, MD PhD FACP [facility]  Available via Epic secure chat 7am-7pm for nonurgent issues Please page for urgent issues To page the attending provider between 7A-7P or the covering provider during after hours 7P-7A, please log into the web site [email or web address] and access using universal  password for that web site. If you do not have the password, please call the hospital  operator.    11/12/2021, 8:22 PM

## 2021-11-12 NOTE — Op Note (Signed)
[facility] Patient Name: Elaine Rivera Procedure Date: 11/12/2021 MRN: [id number] Attending MD: Carol Ada , MD Date of Birth: 26-Jun-1946 CSN: [id number] Age: 76 Admit Type: Outpatient Procedure:                Upper GI endoscopy Indications:              Dysphagia Providers:                Carol Ada, MD, Carlyn Reichert, RN, Tyna Jaksch                            Technician Referring MD:              Medicines:                Propofol per Anesthesia Complications:            No immediate complications. Estimated Blood Loss:     Estimated blood loss: none. Procedure:                Pre-Anesthesia Assessment:                           - Prior to the procedure, a History and Physical                            was performed, and patient medications and                            allergies were reviewed. The patient's tolerance of                            previous anesthesia was also reviewed. The risks                            and benefits of the procedure and the sedation                            options and risks were discussed with the patient.                            All questions were answered, and informed consent                            was obtained. Prior Anticoagulants: The patient has                            taken no previous anticoagulant or antiplatelet                            agents. ASA Grade Assessment: III - A patient with                            severe systemic disease. After reviewing the risks                            and benefits,  the patient was deemed in                            satisfactory condition to undergo the procedure.                           - Sedation was administered by an anesthesia                            professional. Deep sedation was attained.                           After obtaining informed consent, the endoscope was                            passed under direct vision. Throughout the                             procedure, the patient's blood pressure, pulse, and                            oxygen saturations were monitored continuously. The                            [id number]-H190 ([id number]) Olympus endoscope was introduced                            through the mouth, and advanced to the second part                            of duodenum. The upper GI endoscopy was technically                            difficult and complex. The patient tolerated the                            procedure well. Scope In: Scope Out: Findings:      One benign-appearing, intrinsic moderate stenosis was found at the       gastroesophageal junction. This stenosis measured 1.1 cm (inner       diameter) x less than one cm (in length). The stenosis was traversed.      A 5 cm hiatal hernia was present.      The gastroesophageal flap valve was visualized endoscopically and       classified as Hill Grade IV (no fold, wide open lumen, hiatal hernia       present).      Givens VCE were found in the lower third of the esophagus.      Patchy moderate inflammation characterized by erosions and erythema was       found in the gastric body.      The examined duodenum was normal.      The GIVENS VCE was noted in the distal esophagus. There was a stricture       at the GE junction and the esophagus was noted to be torturous and       dilated  in this region. The endoscope was able to be used to push the       VCE in to the gastric lumen. Passage of the VCE dilated the stricture.       The VCE was captured with a Jabier Mutton Net and the VCE was delivered into the       duodenum. Without this manevuer the VCE was going to be trapped in the       large hiatal hernia. Impression:               - Benign-appearing esophageal stenosis.                           - 5 cm hiatal hernia.                           - Gastroesophageal flap valve classified as Hill                            Grade IV (no fold, wide open lumen, hiatal  hernia                            present).                           - Givens VCE were found in the esophagus.                           - Gastritis.                           - Normal examined duodenum.                           - No specimens collected. Moderate Sedation:      Not Applicable - Patient had care per Anesthesia. Recommendation:           - Return patient to hospital ward for ongoing care.                           - Resume regular diet.                           - Continue present medications.                           - Await results of VCE tomorrow.                           - Follow HGB and transfuse as necessary. Procedure Code(s):        --- Professional ---                           [phone], Esophagogastroduodenoscopy, flexible,                            transoral; diagnostic, including collection of  specimen(s) by brushing or washing, when performed                            (separate procedure) Diagnosis Code(s):        --- Professional ---                           K22.2, Esophageal obstruction                           K44.9, Diaphragmatic hernia without obstruction or                            gangrene                           T18.198A, Other foreign object in esophagus causing                            other injury, initial encounter                           K29.70, Gastritis, unspecified, without bleeding                           R13.10, Dysphagia, unspecified CPT copyright 2019 American Medical Association. All rights reserved. The codes documented in this report are preliminary and upon coder review may  be revised to meet current compliance requirements. Carol Ada, MD Carol Ada, MD 11/12/2021 12:47:15 PM This report has been signed electronically. Number of Addenda: 0

## 2021-11-12 NOTE — Progress Notes (Signed)
Patient swallowed capsule pill at 0945 and patient spit back up water and spit. Pill seems to be sitting at the bottom of the esophagus via pill cam. Contacted Dr. Benson Norway. Scheduled EGD for 1200 to remove capsule pill. Bedside nurse made aware.

## 2021-11-12 NOTE — Anesthesia Postprocedure Evaluation (Signed)
Anesthesia Post Note  Patient: Elaine Rivera  Procedure(s) Performed: ESOPHAGOGASTRODUODENOSCOPY (EGD) WITH PROPOFOL     Patient location during evaluation: Endoscopy Anesthesia Type: General and MAC Level of consciousness: awake and alert Pain management: pain level controlled Vital Signs Assessment: post-procedure vital signs reviewed and stable Respiratory status: spontaneous breathing, nonlabored ventilation and respiratory function stable Cardiovascular status: stable and blood pressure returned to baseline Postop Assessment: no apparent nausea or vomiting Anesthetic complications: no   No notable events documented.  Last Vitals:  Vitals:   11/12/21 1240 11/12/21 1255  BP: (!) 172/68 131/68  Pulse:  96  Resp: (!) 25 (!) 21  Temp:    SpO2: 97% 98%    Last Pain:  Vitals:   11/12/21 1255  TempSrc:   PainSc: 0-No pain                 Makenlee Mckeag,W. EDMOND

## 2021-11-13 ENCOUNTER — Encounter (HOSPITAL_COMMUNITY): Payer: Self-pay | Admitting: Gastroenterology

## 2021-11-13 DIAGNOSIS — G8929 Other chronic pain: Secondary | ICD-10-CM | POA: Diagnosis not present

## 2021-11-13 DIAGNOSIS — K2100 Gastro-esophageal reflux disease with esophagitis, without bleeding: Secondary | ICD-10-CM | POA: Diagnosis not present

## 2021-11-13 DIAGNOSIS — K254 Chronic or unspecified gastric ulcer with hemorrhage: Secondary | ICD-10-CM | POA: Diagnosis not present

## 2021-11-13 DIAGNOSIS — K2970 Gastritis, unspecified, without bleeding: Secondary | ICD-10-CM | POA: Diagnosis not present

## 2021-11-13 DIAGNOSIS — I10 Essential (primary) hypertension: Secondary | ICD-10-CM | POA: Diagnosis not present

## 2021-11-13 DIAGNOSIS — E43 Unspecified severe protein-calorie malnutrition: Secondary | ICD-10-CM | POA: Diagnosis not present

## 2021-11-13 DIAGNOSIS — Z681 Body mass index (BMI) 19 or less, adult: Secondary | ICD-10-CM | POA: Diagnosis not present

## 2021-11-13 DIAGNOSIS — K449 Diaphragmatic hernia without obstruction or gangrene: Secondary | ICD-10-CM | POA: Diagnosis not present

## 2021-11-13 DIAGNOSIS — K3189 Other diseases of stomach and duodenum: Secondary | ICD-10-CM | POA: Diagnosis not present

## 2021-11-13 DIAGNOSIS — G40909 Epilepsy, unspecified, not intractable, without status epilepticus: Secondary | ICD-10-CM | POA: Diagnosis not present

## 2021-11-13 DIAGNOSIS — M549 Dorsalgia, unspecified: Secondary | ICD-10-CM | POA: Diagnosis not present

## 2021-11-13 DIAGNOSIS — K222 Esophageal obstruction: Secondary | ICD-10-CM | POA: Diagnosis not present

## 2021-11-13 DIAGNOSIS — Z882 Allergy status to sulfonamides status: Secondary | ICD-10-CM | POA: Diagnosis not present

## 2021-11-13 DIAGNOSIS — M1990 Unspecified osteoarthritis, unspecified site: Secondary | ICD-10-CM | POA: Diagnosis not present

## 2021-11-13 DIAGNOSIS — K922 Gastrointestinal hemorrhage, unspecified: Secondary | ICD-10-CM | POA: Diagnosis present

## 2021-11-13 DIAGNOSIS — K921 Melena: Secondary | ICD-10-CM | POA: Diagnosis present

## 2021-11-13 DIAGNOSIS — R627 Adult failure to thrive: Secondary | ICD-10-CM | POA: Diagnosis not present

## 2021-11-13 DIAGNOSIS — E860 Dehydration: Secondary | ICD-10-CM | POA: Diagnosis not present

## 2021-11-13 DIAGNOSIS — R4181 Age-related cognitive decline: Secondary | ICD-10-CM | POA: Diagnosis not present

## 2021-11-13 DIAGNOSIS — M542 Cervicalgia: Secondary | ICD-10-CM | POA: Diagnosis not present

## 2021-11-13 DIAGNOSIS — M419 Scoliosis, unspecified: Secondary | ICD-10-CM | POA: Diagnosis not present

## 2021-11-13 DIAGNOSIS — D62 Acute posthemorrhagic anemia: Secondary | ICD-10-CM | POA: Diagnosis not present

## 2021-11-13 DIAGNOSIS — I671 Cerebral aneurysm, nonruptured: Secondary | ICD-10-CM | POA: Diagnosis not present

## 2021-11-13 DIAGNOSIS — J9811 Atelectasis: Secondary | ICD-10-CM | POA: Diagnosis not present

## 2021-11-13 DIAGNOSIS — K319 Disease of stomach and duodenum, unspecified: Secondary | ICD-10-CM | POA: Diagnosis not present

## 2021-11-13 DIAGNOSIS — Z20822 Contact with and (suspected) exposure to covid-19: Secondary | ICD-10-CM | POA: Diagnosis not present

## 2021-11-13 DIAGNOSIS — D509 Iron deficiency anemia, unspecified: Secondary | ICD-10-CM | POA: Diagnosis not present

## 2021-11-13 LAB — BASIC METABOLIC PANEL
Anion gap: 6 (ref 5–15)
BUN: 9 mg/dL (ref 8–23)
CO2: 24 mmol/L (ref 22–32)
Calcium: 9.2 mg/dL (ref 8.9–10.3)
Chloride: 109 mmol/L (ref 98–111)
Creatinine, Ser: 0.7 mg/dL (ref 0.44–1.00)
GFR, Estimated: >60 mL/min (ref >60)
Glucose, Bld: 95 mg/dL (ref 70–99)
Potassium: 4.4 mmol/L (ref 3.5–5.1)
Sodium: 139 mmol/L (ref 135–145)

## 2021-11-13 LAB — HEMOGLOBIN: Hemoglobin: 9.6 g/dL — ABNORMAL LOW (ref 12.0–15.0)

## 2021-11-13 LAB — MAGNESIUM: Magnesium: 1.9 mg/dL (ref 1.7–2.4)

## 2021-11-13 MED ORDER — CYCLOBENZAPRINE HCL 5 MG PO TABS: 5.0000 mg | ORAL_TABLET | Freq: Three times a day (TID) | ORAL | Status: DC | PRN

## 2021-11-13 MED ORDER — LIDOCAINE 5 % EX PTCH
1.0000 | MEDICATED_PATCH | CUTANEOUS | Status: DC
  Administered 2021-11-13: 1 via TRANSDERMAL
  Filled 2021-11-13 (×2): qty 1

## 2021-11-13 MED ORDER — LOPERAMIDE HCL 2 MG PO CAPS: 2.0000 mg | ORAL_CAPSULE | Freq: Four times a day (QID) | ORAL | Status: DC | PRN

## 2021-11-13 MED ORDER — ACETAMINOPHEN 500 MG PO TABS: 1000.0000 mg | ORAL_TABLET | Freq: Two times a day (BID) | ORAL | Status: DC

## 2021-11-13 MED ORDER — ACETAMINOPHEN 500 MG PO TABS
1000.0000 mg | ORAL_TABLET | Freq: Two times a day (BID) | ORAL | Status: DC
  Administered 2021-11-13 – 2021-11-14 (×2): 1000 mg via ORAL
  Filled 2021-11-13 (×2): qty 2

## 2021-11-13 MED ORDER — LEVETIRACETAM 500 MG PO TABS
750.0000 mg | ORAL_TABLET | Freq: Two times a day (BID) | ORAL | Status: DC
  Filled 2021-11-13: qty 1

## 2021-11-13 MED ORDER — LEVETIRACETAM 500 MG PO TABS
750.0000 mg | ORAL_TABLET | Freq: Two times a day (BID) | ORAL | Status: AC
  Filled 2021-11-13: qty 1

## 2021-11-13 MED ORDER — MEMANTINE HCL 10 MG PO TABS
5.0000 mg | ORAL_TABLET | Freq: Every day | ORAL | Status: DC
  Administered 2021-11-13: 5 mg via ORAL
  Filled 2021-11-13: qty 1

## 2021-11-13 MED ORDER — CYCLOBENZAPRINE HCL 5 MG PO TABS
5.0000 mg | ORAL_TABLET | Freq: Once | ORAL | Status: AC
  Administered 2021-11-13: 5 mg via ORAL
  Filled 2021-11-13: qty 1

## 2021-11-13 MED ORDER — SODIUM CHLORIDE 0.9 % IV SOLN
750.0000 mg | Freq: Once | INTRAVENOUS | Status: AC
  Administered 2021-11-14: 750 mg via INTRAVENOUS
  Filled 2021-11-13: qty 7.5

## 2021-11-13 MED ORDER — SODIUM CHLORIDE 0.9 % IV SOLN
510.0000 mg | Freq: Once | INTRAVENOUS | Status: DC
  Filled 2021-11-13: qty 17

## 2021-11-13 MED ORDER — ENSURE ENLIVE PO LIQD
237.0000 mL | Freq: Two times a day (BID) | ORAL | Status: DC
  Administered 2021-11-14: 237 mL via ORAL

## 2021-11-13 MED ORDER — SENNOSIDES-DOCUSATE SODIUM 8.6-50 MG PO TABS
1.0000 | ORAL_TABLET | Freq: Every day | ORAL | Status: DC
Start: 2021-11-13 — End: 2021-11-14
  Administered 2021-11-13: 1 via ORAL
  Filled 2021-11-13: qty 1

## 2021-11-13 MED ORDER — BUSPIRONE HCL 5 MG PO TABS
7.5000 mg | ORAL_TABLET | Freq: Two times a day (BID) | ORAL | Status: DC
  Administered 2021-11-13 – 2021-11-14 (×2): 7.5 mg via ORAL
  Filled 2021-11-13 (×2): qty 2

## 2021-11-13 MED ORDER — LACTATED RINGERS IV SOLN: INTRAVENOUS | Status: AC

## 2021-11-13 MED ORDER — MELATONIN 3 MG PO TABS
3.0000 mg | ORAL_TABLET | Freq: Every day | ORAL | Status: DC
  Administered 2021-11-13: 3 mg via ORAL
  Filled 2021-11-13: qty 1

## 2021-11-13 NOTE — Progress Notes (Addendum)
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,h/o  seizures and aphasia, who resides at ALF relates she was not feeling well in general the patient cannot provide a good history and she does not offer any essential insight of why she is here she just relates that state her PCP at the facility told her she does not look good so recommended for her to come in to the ED,  According to the patient she relates she fell to the floor and hit her neck  In the ED: He was found to be afebrile vitals stable, hemoglobin of 9 (previous hemoglobin last month in 2023 was 11.4), CT of the head showed no acute intracranial changes ,chronic microvascular ischemic changes, CT of the spine showed no acute fracture listhesis or dislocation.  Chest x-ray showed enlarged cardiac silhouette and right and left basilar atelectasis.  In the ED rectal exam was done that showed melanotic stools    Subjective:   I have Reviewed past medical records,  I have reviewed nursing notes, Vitals, and Lab results since pt's last encounter. Pertinent lab results CBC, BMP,  Interval history obtained from talking to patient and daughter today  She is Feeling better, currently denies pain,  does felt nausea this am Reports lost 40lbs in the last few months due to no appetite   Assessment & Plan:  Principal Problem:   Acute lower GI bleeding Active Problems:   HTN (hypertension)   Protein-calorie malnutrition, severe   Seizure disorder (Elaine Rivera)   Acute blood loss anemia   Melena   FTT (failure to thrive) in adult    Assessment and Plan:  * Acute lower GI bleeding/acute blood loss anemia, possible symptomatic anemia ( pcp states she does not look good compare to a week  ago) -hemoglobin of 9 (previous hemoglobin last month in 2023 was 11.4), -Rectal exam by the ED physician showed melanotic stools - IV Protonix twice a day - will follow GI Dr. Benson Rivera recommendation  Addendum VCE showed a small bleeding vessel in the proximal small bowel, plan to proceed with EGD tomorrow  She has iron deficiency, patient and daughter agreed IV iron, order placed  Chest x-ray showed possible bilateral atelectasis the patient denies any cough shortness of breath any fever no leukocytosis  Her UA does not show any signs of infection. discontinue antibiotics.  HTN (hypertension)- (present on admission) She does not take any hypertension medication at home She did have elevated blood pressure initially on presentation likely due to stress Currently on no antihypertensive medication, blood pressure seems to be relatively well controlled.  Seizure disorder (Elaine Rivera) -Change oral Keppra to IV during n.p.o. -Change back to oral today -Of note patient was seen on January 30 is in the hospital due to concerning for recurrent seizure, per daughter that  likely Ultram induced, daughter requests Ultram to be put on allergy list -She prefers patient to keep on current dose of Keppra 750mg  bid, she can consider increase Keppra dose if patient has recurrent seizure in the future off Ultram  Memory impairment? Patient and daughter denies Daughter is not aware when patient was started on namenda Currently she is aaox3,   Chronic neck pain/back pain Continue lidocaine patch  protein-calorie malnutrition, severe- (present on admission)  Ensure 3 times daily counseling. Underweight: Body mass index is 17.57 kg/m.Marland Kitchen Lost 40lbs due to no appetite She is currently on Remeron   FTT: falls ,  now use wheelchair at ALF  PT eval, will likely need home health Will benefit from outpatient palliative care      I ordered the following labs:  Unresulted Labs (From admission, onward)      Start     Ordered   11/14/21 0500  Iron and TIBC  Tomorrow morning,   R       Question:  Specimen collection method  Answer:  Lab=Lab collect   11/13/21 1257   11/14/21 0500  Ferritin  Tomorrow morning,   R       Question:  Specimen collection method  Answer:  Lab=Lab collect   11/13/21 1257   11/14/21 0500  Reticulocytes  Tomorrow morning,   R       Question:  Specimen collection method  Answer:  Lab=Lab collect   11/13/21 1257   11/14/21 0500  CBC  Tomorrow morning,   R       Question:  Specimen collection method  Answer:  Lab=Lab collect   11/13/21 1342   11/14/21 9622  Basic metabolic panel  Tomorrow morning,   R       Question:  Specimen collection method  Answer:  Lab=Lab collect   11/13/21 1342             DVT prophylaxis: SCDs Start: 11/11/21 1716   Code Status:   Code Status: Full Code  Family Communication: Friend at bedside on 2/15, daughter over the phone on 2/16  Disposition:   Dispo: The patient is from: ALF              Anticipated d/c is to: Return to Elaine Rivera with home health              Anticipated d/c date is: Likely tomorrow  Antimicrobials:   Anti-infectives (From admission, onward)    Start     Dose/Rate Route Frequency Ordered Stop   11/11/21 1515  cefTRIAXone (ROCEPHIN) 1 g in sodium chloride 0.9 % 100 mL IVPB        1 g 200 mL/hr over 30 Minutes Intravenous  Once 11/11/21 1505 11/11/21 1603   11/11/21 1515  azithromycin (ZITHROMAX) 500 mg in sodium chloride 0.9 % 250 mL IVPB        500 mg 250 mL/hr over 60 Minutes Intravenous  Once 11/11/21 1505 11/11/21 1724           Objective: Vitals:   11/12/21 1144 11/12/21 1240 11/12/21 1255 11/13/21 0446  BP: 104/61 (!) 172/68 131/68 114/63  Pulse: 79  96 68  Resp: 17 (!) 25 (!) 21 14  Temp: 97.7 F (36.5 C)   97.8 F (36.6 C)  TempSrc: Temporal   Oral  SpO2: 91% 97% 98% 94%  Weight:      Height:        Intake/Output Summary (Last 24 hours) at 11/13/2021 1349 Last data filed at  11/13/2021 0845 Gross per 24 hour  Intake 999.58 ml  Output 1102 ml  Net -102.42 ml   Filed Weights   11/11/21 1123  Weight: 39.5 kg    Examination:  General exam: Thin, malnourished, very frail, chronic tremor, but does not appear in acute distress Respiratory system: Clear to auscultation. Respiratory effort normal. Cardiovascular system:  RRR.  Gastrointestinal system: Abdomen is nondistended, soft and nontender.  Normal bowel sounds  heard. Central nervous system: Alert and oriented. less Anxious, look better, chronic tremor Extremities:  no edema Skin: No rashes, lesions or ulcers Psychiatry: less Anxious.     Data Reviewed: I have personally reviewed  labs and visualized  imaging studies since the last encounter and formulate the plan        Scheduled Meds:  cyanocobalamin  1,000 mcg Intramuscular Daily   feeding supplement  237 mL Oral BID BM   folic acid  1 mg Oral Daily   influenza vaccine adjuvanted  0.5 mL Intramuscular Tomorrow-1000   levETIRAcetam  750 mg Oral BID   mirtazapine  7.5 mg Oral QHS   pantoprazole (PROTONIX) IV  40 mg Intravenous Q12H   pneumococcal 23 valent vaccine  0.5 mL Intramuscular Tomorrow-1000   Continuous Infusions:  dexrose 5 % and 0.45 % NaCl with KCl 30 mEq/L 10 mL/hr at 11/12/21 1722     LOS: 2 days      Florencia Reasons, MD PhD FACP [facility]  Available via Epic secure chat 7am-7pm for nonurgent issues Please page for urgent issues To page the attending provider between 7A-7P or the covering provider during after hours 7P-7A, please log into the web site [email or web address] and access using universal [place] password for that web site. If you do not have the password, please call the hospital operator.    11/13/2021, 1:49 PM

## 2021-11-13 NOTE — Care Management CC44 (Signed)
Condition Code 44 Documentation Completed  Patient Details  Name: Elaine Rivera MRN: [id number] Date of Birth: 01-14-1946   Condition Code 44 given:  Yes Patient signature on Condition Code 44 notice:  Yes Documentation of 2 MD's agreement:  Yes Code 44 added to claim:  Yes    Lynnell Catalan, RN 11/13/2021, 1:47 PM

## 2021-11-13 NOTE — Care Management Obs Status (Signed)
[facility] NOTIFICATION   Patient Details  Name: CORNISHA ZETINO MRN: [id number] Date of Birth: 09/10/1946   Medicare Observation Status Notification Given:  Yes    Lynnell Catalan, RN 11/13/2021, 1:47 PM

## 2021-11-13 NOTE — H&P (View-Only) (Signed)
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,h/o  seizures and aphasia, who resides at ALF relates she was not feeling well in general the patient cannot provide a good history and she does not offer any essential insight of why she is here she just relates that state her PCP at the facility told her she does not look good so recommended for her to come in to the ED,  According to the patient she relates she fell to the floor and hit her neck  In the ED: He was found to be afebrile vitals stable, hemoglobin of 9 (previous hemoglobin last month in 2023 was 11.4), CT of the head showed no acute intracranial changes ,chronic microvascular ischemic changes, CT of the spine showed no acute fracture listhesis or dislocation.  Chest x-ray showed enlarged cardiac silhouette and right and left basilar atelectasis.  In the ED rectal exam was done that showed melanotic stools    Subjective:   I have Reviewed past medical records,  I have reviewed nursing notes, Vitals, and Lab results since pt's last encounter. Pertinent lab results CBC, BMP,  Interval history obtained from talking to patient and daughter today  She is Feeling better, currently denies pain,  does felt nausea this am Reports lost 40lbs in the last few months due to no appetite   Assessment & Plan:  Principal Problem:   Acute lower GI bleeding Active Problems:   HTN (hypertension)   Protein-calorie malnutrition, severe   Seizure disorder ([place])   Acute blood loss anemia   Melena   FTT (failure to thrive) in adult    Assessment and Plan:  * Acute lower GI bleeding/acute blood loss anemia, possible symptomatic anemia ( pcp states she does not look good compare to a week  ago) -hemoglobin of 9 (previous hemoglobin last month in 2023 was 11.4), -Rectal exam by the ED physician showed melanotic stools - IV Protonix twice a day - will follow GI Dr. Benson Norway recommendation  Addendum VCE showed a small bleeding vessel in the proximal small bowel, plan to proceed with EGD tomorrow  She has iron deficiency, patient and daughter agreed IV iron, order placed  Chest x-ray showed possible bilateral atelectasis the patient denies any cough shortness of breath any fever no leukocytosis  Her UA does not show any signs of infection. discontinue antibiotics.  HTN (hypertension)- (present on admission) She does not take any hypertension medication at home She did have elevated blood pressure initially on presentation likely due to stress Currently on no antihypertensive medication, blood pressure seems to be relatively well controlled.  Seizure disorder ([place]) -Change oral Keppra to IV during n.p.o. -Change back to oral today -Of note patient was seen on January 30 is in the hospital due to concerning for recurrent seizure, per daughter that  likely Ultram induced, daughter requests Ultram to be put on allergy list -She prefers patient to keep on current dose of Keppra 750mg  bid, she can consider increase Keppra dose if patient has recurrent seizure in the future off Ultram  Memory impairment? Patient and daughter denies Daughter is not aware when patient was started on namenda Currently she is aaox3,   Chronic neck pain/back pain Continue lidocaine patch  protein-calorie malnutrition, severe- (present on admission)  Ensure 3 times daily counseling. Underweight: Body mass index is 17.57 kg/m.Marland Kitchen Lost 40lbs due to no appetite She is currently on Remeron   FTT: falls ,  now use wheelchair at ALF  PT eval, will likely need home health Will benefit from outpatient palliative care      I ordered the following labs:  Unresulted Labs (From admission, onward)      Start     Ordered   11/14/21 0500  Iron and TIBC  Tomorrow morning,   R       Question:  Specimen collection method  Answer:  Lab=Lab collect   11/13/21 1257   11/14/21 0500  Ferritin  Tomorrow morning,   R       Question:  Specimen collection method  Answer:  Lab=Lab collect   11/13/21 1257   11/14/21 0500  Reticulocytes  Tomorrow morning,   R       Question:  Specimen collection method  Answer:  Lab=Lab collect   11/13/21 1257   11/14/21 0500  CBC  Tomorrow morning,   R       Question:  Specimen collection method  Answer:  Lab=Lab collect   11/13/21 1342   11/14/21 6160  Basic metabolic panel  Tomorrow morning,   R       Question:  Specimen collection method  Answer:  Lab=Lab collect   11/13/21 1342             DVT prophylaxis: SCDs Start: 11/11/21 1716   Code Status:   Code Status: Full Code  Family Communication: Friend at bedside on 2/15, daughter over the phone on 2/16  Disposition:   Dispo: The patient is from: ALF              Anticipated d/c is to: Return to [facility] with home health              Anticipated d/c date is: Likely tomorrow  Antimicrobials:   Anti-infectives (From admission, onward)    Start     Dose/Rate Route Frequency Ordered Stop   11/11/21 1515  cefTRIAXone (ROCEPHIN) 1 g in sodium chloride 0.9 % 100 mL IVPB        1 g 200 mL/hr over 30 Minutes Intravenous  Once 11/11/21 1505 11/11/21 1603   11/11/21 1515  azithromycin (ZITHROMAX) 500 mg in sodium chloride 0.9 % 250 mL IVPB        500 mg 250 mL/hr over 60 Minutes Intravenous  Once 11/11/21 1505 11/11/21 1724           Objective: Vitals:   11/12/21 1144 11/12/21 1240 11/12/21 1255 11/13/21 0446  BP: 104/61 (!) 172/68 131/68 114/63  Pulse: 79  96 68  Resp: 17 (!) 25 (!) 21 14  Temp: 97.7 F (36.5 C)   97.8 F (36.6 C)  TempSrc: Temporal   Oral  SpO2: 91% 97% 98% 94%  Weight:      Height:        Intake/Output Summary (Last 24 hours) at 11/13/2021 1349 Last data filed at  11/13/2021 0845 Gross per 24 hour  Intake 999.58 ml  Output 1102 ml  Net -102.42 ml   Filed Weights   11/11/21 1123  Weight: 39.5 kg    Examination:  General exam: Thin, malnourished, very frail, chronic tremor, but does not appear in acute distress Respiratory system: Clear to auscultation. Respiratory effort normal. Cardiovascular system:  RRR.  Gastrointestinal system: Abdomen is nondistended, soft and nontender.  Normal bowel sounds  heard. Central nervous system: Alert and oriented. less Anxious, look better, chronic tremor Extremities:  no edema Skin: No rashes, lesions or ulcers Psychiatry: less Anxious.     Data Reviewed: I have personally reviewed  labs and visualized  imaging studies since the last encounter and formulate the plan        Scheduled Meds:  cyanocobalamin  1,000 mcg Intramuscular Daily   feeding supplement  237 mL Oral BID BM   folic acid  1 mg Oral Daily   influenza vaccine adjuvanted  0.5 mL Intramuscular Tomorrow-1000   levETIRAcetam  750 mg Oral BID   mirtazapine  7.5 mg Oral QHS   pantoprazole (PROTONIX) IV  40 mg Intravenous Q12H   pneumococcal 23 valent vaccine  0.5 mL Intramuscular Tomorrow-1000   Continuous Infusions:  dexrose 5 % and 0.45 % NaCl with KCl 30 mEq/L 10 mL/hr at 11/12/21 1722     LOS: 2 days      Florencia Reasons, MD PhD FACP [facility]  Available via Epic secure chat 7am-7pm for nonurgent issues Please page for urgent issues To page the attending provider between 7A-7P or the covering provider during after hours 7P-7A, please log into the web site [email or web address] and access using universal [place] password for that web site. If you do not have the password, please call the hospital operator.    11/13/2021, 1:49 PM

## 2021-11-13 NOTE — TOC Initial Note (Signed)
Transition of Care [facility]) - Initial/Assessment Note    Patient Details  Name: Elaine Rivera MRN: [id number] Date of Birth: 08-09-1946  Transition of Care [facility] CM/SW Contact:    Lynnell Catalan, RN Phone Number: 11/13/2021, 2:00 PM  Clinical Narrative:                 Pt from [facility] and will go back there at dc.  TOC will continue to follow.  Expected Discharge Plan: Assisted Living Barriers to Discharge: Continued Medical Work up    Expected Discharge Plan and Services Expected Discharge Plan: Assisted Living   Discharge Planning Services: CM Consult   Living arrangements for the past 2 months: Assisted Living Facility                   Prior Living Arrangements/Services Living arrangements for the past 2 months: [place] Lives with:: Facility Resident          Need for Family Participation in Patient Care: Yes (Comment) Care giver support system in place?: Yes (comment)   Criminal Activity/Legal Involvement Pertinent to Current Situation/Hospitalization: No - Comment as needed  Activities of Daily Living Home Assistive Devices/Equipment: Wheelchair ADL Screening (condition at time of admission) Patient's cognitive ability adequate to safely complete daily activities?: Yes Is the patient deaf or have difficulty hearing?: No Does the patient have difficulty seeing, even when wearing glasses/contacts?: No Does the patient have difficulty concentrating, remembering, or making decisions?: No Patient able to express need for assistance with ADLs?: Yes Does the patient have difficulty dressing or bathing?: Yes Independently performs ADLs?: No Communication: Independent Dressing (OT): Needs assistance Is this a change from baseline?: Pre-admission baseline Grooming: Independent Feeding: Independent Bathing: Needs assistance Is this a change from baseline?: Pre-admission baseline Toileting: Needs assistance Is this a  change from baseline?: Pre-admission baseline In/Out Bed: Needs assistance Is this a change from baseline?: Pre-admission baseline Does the patient have difficulty walking or climbing stairs?: Yes Weakness of Legs: Both Weakness of Arms/Hands: Both  Emotional Assessment Appearance:: Appears stated age Attitude/Demeanor/Rapport: Gracious Affect (typically observed): Calm Orientation: : Oriented to Self, Oriented to Place, Oriented to  Time, Oriented to Situation Alcohol / Substance Use: Not Applicable Psych Involvement: No (comment)  Admission diagnosis:  Melena [K92.1] Urinary tract infection without hematuria, site unspecified [N39.0] Gastrointestinal hemorrhage, unspecified gastrointestinal hemorrhage type [K92.2] Community acquired pneumonia, unspecified laterality [J18.9] FTT (failure to thrive) in adult [R62.7] Patient Active Problem List   Diagnosis Date Noted   FTT (failure to thrive) in adult 11/13/2021   Acute lower GI bleeding 11/11/2021   Acute blood loss anemia 11/11/2021   Melena 11/11/2021   Seizure disorder ([place]) 10/27/2021   Protein-calorie malnutrition, severe 08/26/2021   Failure to thrive in adult 08/23/2021   Right 10th rib fracture 08/23/2021   Physical deconditioning 08/23/2021   Bad headache 03/14/2021   Seizure ([place]) 03/06/2021   CVA (cerebral vascular accident) ([place]) 03/06/2021   Ischemic cerebrovascular accident (CVA) ([place]) 03/05/2021   Hypokalemia 03/05/2021   Anxiety 03/05/2021   Transient speech disturbance 03/05/2021   Lung nodule 03/05/2021   Aphasia    Fall    Anemia 02/28/2020   Shortness of breath 01/24/2020   Dizziness 01/24/2020   Herpes 10/04/2018   Cerebral aneurysm 09/05/2018   NSAID long-term use 04/20/2016   Scoliosis 04/20/2016   Depression 04/20/2016   HTN (hypertension) 04/20/2016   GERD (gastroesophageal reflux disease) 04/20/2016   Occult GI bleeding 04/20/2016  PCP:  System, Provider Not In Pharmacy:   [place], [place] [address] Phone: [phone] Fax: [phone]     Social Determinants of Health (SDOH) Interventions    Readmission Risk Interventions Readmission Risk Prevention Plan 11/13/2021  Transportation Screening Complete  PCP or Specialist Appt within 5-7 Days Complete  Home Care Screening Complete  Medication Review (RN CM) Complete  Some recent data might be hidden

## 2021-11-14 ENCOUNTER — Encounter (HOSPITAL_COMMUNITY)
Admission: EM | Disposition: A | Discharge status: Skilled Nursing Facility | Payer: Self-pay | Source: Skilled Nursing Facility | Attending: Internal Medicine

## 2021-11-14 ENCOUNTER — Inpatient Hospital Stay (HOSPITAL_COMMUNITY): Payer: Medicare Other | Admitting: Anesthesiology

## 2021-11-14 ENCOUNTER — Encounter (HOSPITAL_COMMUNITY): Payer: Self-pay | Admitting: Internal Medicine

## 2021-11-14 DIAGNOSIS — D509 Iron deficiency anemia, unspecified: Secondary | ICD-10-CM

## 2021-11-14 DIAGNOSIS — K922 Gastrointestinal hemorrhage, unspecified: Secondary | ICD-10-CM | POA: Diagnosis not present

## 2021-11-14 DIAGNOSIS — K3189 Other diseases of stomach and duodenum: Secondary | ICD-10-CM | POA: Diagnosis not present

## 2021-11-14 DIAGNOSIS — K449 Diaphragmatic hernia without obstruction or gangrene: Secondary | ICD-10-CM

## 2021-11-14 DIAGNOSIS — K254 Chronic or unspecified gastric ulcer with hemorrhage: Secondary | ICD-10-CM | POA: Diagnosis not present

## 2021-11-14 DIAGNOSIS — K921 Melena: Secondary | ICD-10-CM | POA: Diagnosis not present

## 2021-11-14 DIAGNOSIS — K222 Esophageal obstruction: Secondary | ICD-10-CM

## 2021-11-14 HISTORY — PX: ENTEROSCOPY: SHX5533

## 2021-11-14 LAB — RETICULOCYTES
Immature Retic Fract: 20.8 % — ABNORMAL HIGH (ref 2.3–15.9)
RBC.: 3.23 MIL/uL — ABNORMAL LOW (ref 3.87–5.11)
Retic Count, Absolute: 95.6 10*3/uL (ref 19.0–186.0)
Retic Ct Pct: 3 % (ref 0.4–3.1)

## 2021-11-14 LAB — IRON AND TIBC
Iron: 49 ug/dL (ref 28–170)
Saturation Ratios: 15 % (ref 10.4–31.8)
TIBC: 326 ug/dL (ref 250–450)
UIBC: 277 ug/dL

## 2021-11-14 LAB — RESP PANEL BY RT-PCR (FLU A&B, COVID) ARPGX2
Influenza A by PCR: NEGATIVE
Influenza B by PCR: NEGATIVE
SARS Coronavirus 2 by RT PCR: NEGATIVE

## 2021-11-14 LAB — BASIC METABOLIC PANEL
Anion gap: 6 (ref 5–15)
BUN: 7 mg/dL — ABNORMAL LOW (ref 8–23)
CO2: 26 mmol/L (ref 22–32)
Calcium: 9.4 mg/dL (ref 8.9–10.3)
Chloride: 107 mmol/L (ref 98–111)
Creatinine, Ser: 0.53 mg/dL (ref 0.44–1.00)
GFR, Estimated: >60 mL/min (ref >60)
Glucose, Bld: 91 mg/dL (ref 70–99)
Potassium: 3.8 mmol/L (ref 3.5–5.1)
Sodium: 139 mmol/L (ref 135–145)

## 2021-11-14 LAB — CBC
HCT: 30 % — ABNORMAL LOW (ref 36.0–46.0)
Hemoglobin: 9.3 g/dL — ABNORMAL LOW (ref 12.0–15.0)
MCH: 29.1 pg (ref 26.0–34.0)
MCHC: 31 g/dL (ref 30.0–36.0)
MCV: 93.8 fL (ref 80.0–100.0)
Platelets: 309 10*3/uL (ref 150–400)
RBC: 3.2 MIL/uL — ABNORMAL LOW (ref 3.87–5.11)
RDW: 13.3 % (ref 11.5–15.5)
WBC: 4.9 10*3/uL (ref 4.0–10.5)
nRBC: 0 % (ref 0.0–0.2)

## 2021-11-14 LAB — FERRITIN: Ferritin: 437 ng/mL — ABNORMAL HIGH (ref 11–307)

## 2021-11-14 SURGERY — ENTEROSCOPY
Anesthesia: Monitor Anesthesia Care

## 2021-11-14 MED ORDER — PANTOPRAZOLE SODIUM 40 MG PO TBEC: 40.0000 mg | DELAYED_RELEASE_TABLET | Freq: Every day | ORAL | 1 refills | Status: DC

## 2021-11-14 MED ORDER — LIDOCAINE HCL (CARDIAC) PF 100 MG/5ML IV SOSY
PREFILLED_SYRINGE | INTRAVENOUS | Status: DC | PRN
  Administered 2021-11-14: 60 mg via INTRAVENOUS

## 2021-11-14 MED ORDER — ASPIRIN EC 81 MG PO TBEC: 81.0000 mg | DELAYED_RELEASE_TABLET | Freq: Every day | ORAL | 11 refills | Status: DC

## 2021-11-14 MED ORDER — VITAMIN C 250 MG PO TABS: 250.0000 mg | ORAL_TABLET | Freq: Every day | ORAL | 0 refills | Status: DC

## 2021-11-14 MED ORDER — PROPOFOL 10 MG/ML IV BOLUS
INTRAVENOUS | Status: DC | PRN
  Administered 2021-11-14: 20 mg via INTRAVENOUS
  Administered 2021-11-14: 10 mg via INTRAVENOUS
  Administered 2021-11-14: 20 mg via INTRAVENOUS
  Administered 2021-11-14 (×2): 10 mg via INTRAVENOUS
  Administered 2021-11-14: 40 mg via INTRAVENOUS
  Administered 2021-11-14: 10 mg via INTRAVENOUS

## 2021-11-14 MED ORDER — FOLIC ACID 1 MG PO TABS: 1.0000 mg | ORAL_TABLET | Freq: Every day | ORAL | 0 refills | Status: DC

## 2021-11-14 MED ORDER — FERROUS SULFATE 325 (65 FE) MG PO TBEC: 325.0000 mg | DELAYED_RELEASE_TABLET | Freq: Every day | ORAL | 3 refills | Status: DC

## 2021-11-14 MED ORDER — THIAMINE HCL 100 MG PO TABS: 100.0000 mg | ORAL_TABLET | Freq: Every day | ORAL | 0 refills | Status: DC

## 2021-11-14 MED ORDER — SENNOSIDES-DOCUSATE SODIUM 8.6-50 MG PO TABS
1.0000 | ORAL_TABLET | Freq: Every day | ORAL | Status: DC
Start: 2021-11-14 — End: 2021-11-14

## 2021-11-14 MED ORDER — VITAMIN B-12 1000 MCG PO TABS
1000.0000 ug | ORAL_TABLET | Freq: Every day | ORAL | 0 refills | Status: DC
Start: 2021-11-14 — End: 2023-03-16

## 2021-11-14 MED ORDER — VITAMIN B-12 1000 MCG PO TABS
1000.0000 ug | ORAL_TABLET | Freq: Every day | ORAL | 0 refills | Status: DC
Start: 2021-11-14 — End: 2021-11-14

## 2021-11-14 MED ORDER — LACTATED RINGERS IV SOLN
INTRAVENOUS | Status: DC
Start: 2021-11-14 — End: 2021-11-14

## 2021-11-14 MED ORDER — SODIUM CHLORIDE 0.9 % IV SOLN: INTRAVENOUS | Status: DC

## 2021-11-14 MED ORDER — SENNOSIDES-DOCUSATE SODIUM 8.6-50 MG PO TABS: 1.0000 | ORAL_TABLET | Freq: Every day | ORAL | 0 refills | Status: DC

## 2021-11-14 NOTE — Anesthesia Postprocedure Evaluation (Signed)
Anesthesia Post Note  Patient: Elaine Rivera  Procedure(s) Performed: ENTEROSCOPY     Patient location during evaluation: Endoscopy Anesthesia Type: MAC Level of consciousness: awake and alert Pain management: pain level controlled Vital Signs Assessment: post-procedure vital signs reviewed and stable Respiratory status: spontaneous breathing, nonlabored ventilation, respiratory function stable and patient connected to nasal cannula oxygen Cardiovascular status: blood pressure returned to baseline and stable Postop Assessment: no apparent nausea or vomiting Anesthetic complications: no   No notable events documented.  Last Vitals:  Vitals:   11/14/21 0940 11/14/21 0950  BP: (!) 148/81 (!) 144/83  Pulse: 75 71  Resp: (!) 21 14  Temp:    SpO2: 95% 93%    Last Pain:  Vitals:   11/14/21 0950  TempSrc:   PainSc: 0-No pain                 Demone Lyles DANIEL

## 2021-11-14 NOTE — Discharge Summary (Signed)
Discharge Summary  Elaine Rivera YWV:[id number] DOB: 04/15/1946  PCP: System, Provider Not In  Admit date: 11/11/2021 Discharge date: 11/14/2021  Time spent: 89mins, more than 50% time spent on coordination of care.  Recommendations for Outpatient Follow-up:  F/u with PCP within a week  for hospital discharge follow up, repeat cbc/bmp at follow up F/u with GI Dr Collene Mares in 2-4weeks, Dr. Collene Mares to decide when to resume aspirin Home health order placed Recommend palliative care to follow patient at the facility  Discharge Diagnoses:  Active Hospital Problems   Diagnosis Date Noted   Acute lower GI bleeding 11/11/2021   FTT (failure to thrive) in adult 11/13/2021   GI bleed 11/13/2021   Acute blood loss anemia 11/11/2021   Melena 11/11/2021   Seizure disorder ([place]) 10/27/2021   Protein-calorie malnutrition, severe 08/26/2021   HTN (hypertension) 04/20/2016    Resolved Hospital Problems  No resolved problems to display.    Discharge Condition: stable  Diet recommendation: Regular diet  Filed Weights   11/11/21 1123 11/14/21 0841  Weight: 39.5 kg 39.5 kg    History of present illness: (Per admitting MD Dr. Aileen Fass) Elaine Rivera is a 76 y.o. female with medical history significant of of seizures and aphasia, she had an EGD by Dr. Collene Mares in 2017 that showed esophagitis who resides at skilled nursing facility relates she was not feeling well in general the patient cannot provide a good history and she does not offer any essential insight of why she is here she just relates that state her PCP at the facility told her she does not look good so recommended for her to come in to the ED she denies any fever, cough, shortness of breath, nausea, vomiting, diarrhea no abdominal pain no asymmetric weakness no urinary symptoms.  According to the patient she relates she fell to the floor and hit her neck   In the ED: He was found to be afebrile vitals stable, hemoglobin of 9 (previous  hemoglobin last month in 2023 was 11.4) CT of the head showed no acute intracranial changes chronic microvascular ischemic changes, CT of the spine showed no acute fracture listhesis or dislocation.  Chest x-ray showed enlarged cardiac silhouette and right and left basilar atelectasis.  In the ED rectal exam was done that showed melanotic stools  Hospital Course:  Principal Problem:   Acute lower GI bleeding Active Problems:   HTN (hypertension)   Protein-calorie malnutrition, severe   Seizure disorder ([facility])   Acute blood loss anemia   Melena   FTT (failure to thrive) in adult   GI bleed   Assessment and Plan:  * Acute lower GI bleeding/acute blood loss anemia, possible symptomatic anemia ( pcp states she does not look good compare to a week ago) -hemoglobin of 9 (previous hemoglobin last month in 2023 was 11.4), -Rectal exam by the ED physician showed melanotic stools - she received IV Protonix twice a day,  -had esophageal stricture /difficulty passing VCE camera, had urgent EGD 2/15 to help with passing VCE, questionable small bleeding vessels proximal small bowel per VCE, underwent another EGD on 2/17 revealed erosive gastropathy, no active bleeding, benign-appearing esophageal stenosis, 5 cm hiatal hernia, gastroesophageal flap valve, GI recommend PPI daily for gastric erosion and outpatient follow-up with GI Dr. Collene Mares in 2 to 4 weeks     Iron deficiency anemia, she received IV iron infusion in the hospital, she is discharged on oral iron supplement   Low normal B12, start B12  supplement to target B12 about 500   HTN (hypertension)- (present on admission) She does not take any hypertension medication at home She did have elevated blood pressure initially on presentation likely due to stress Currently on no antihypertensive medication, blood pressure seems to be relatively well controlled. Follow-up with PCP   Seizure disorder ([place]) -Change oral Keppra to IV during n.p.o. --Of  note patient was hospitalized on January 30 due to concerning for recurrent seizure, per daughter it was  likely Ultram induced, daughter requests Ultram to be put on allergy list -She prefers patient to keep on current dose of Keppra 750mg  bid, she can consider increase Keppra dose if patient has recurrent seizure in the future off Ultram -Follow-up with PCP   Memory impairment? Patient and daughter denies Daughter is not aware when patient was started on namenda Currently she is aaox3,  Follow-up with PCP   Chronic neck pain/back pain -Severe scoliosis, was evaluated by [facility] spine surgeon in the past and was told not a candidate for surgery Continue pain management ,continue lidocaine patch -Follow-up with PCP   protein-calorie malnutrition, severe- (present on admission) Ensure 3 times daily counseling. Underweight: Body mass index is 17.57 kg/m.Marland Kitchen Lost 40lbs due to no appetite She is currently on Remeron Follow-up with PCP    FTT: falls ,  now use wheelchair at ALF Daughter agreed with home health Will benefit from outpatient palliative care       Discharge Exam: BP 140/79 (BP Location: Left Arm)    Pulse 74    Temp (!) 97.5 F (36.4 C) (Axillary)    Resp 16    Ht 4\' 11"  (1.499 m)    Wt 39.5 kg    SpO2 94%    BMI 17.59 kg/m   General: Frail, thin, chronically ill-appearing, NAD, pleasant Cardiovascular: RRR Respiratory: Normal respiratory effort    Discharge Instructions     Diet general   Complete by: As directed    Increase activity slowly   Complete by: As directed       Allergies as of 11/14/2021       Reactions   Sulfa Antibiotics Other (See Comments)   Reaction:  Unknown    Tramadol Other (See Comments)   seizures        Medication List     STOP taking these medications    meclizine 12.5 MG tablet Commonly known as: ANTIVERT       TAKE these medications    acetaminophen 500 MG tablet Commonly known as: TYLENOL Take 1,000 mg by mouth  in the morning and at bedtime.   aspirin EC 81 MG tablet Take 1 tablet (81 mg total) by mouth daily. Please hold this medication, resume when okay with GI Dr. Collene Mares What changed: additional instructions   busPIRone 7.5 MG tablet Commonly known as: BUSPAR Take 7.5 mg by mouth 2 (two) times daily.   Colace 100 MG capsule Generic drug: docusate sodium Take 100 mg by mouth daily as needed for moderate constipation.   Ensure Plus Liqd Take 237 mLs by mouth 2 (two) times daily between meals.   ferrous sulfate 325 (65 FE) MG EC tablet Take 1 tablet (325 mg total) by mouth daily with breakfast.   folic acid 1 MG tablet Commonly known as: FOLVITE Take 1 tablet (1 mg total) by mouth daily. Start taking on: November 15, 2021   levETIRAcetam 750 MG tablet Commonly known as: Keppra Take 1 tablet (750 mg total) by mouth 2 (two) times  daily.   Lidocaine 4 % Ptch Apply 1 patch topically daily.   melatonin 3 MG Tabs tablet Take 3 mg by mouth at bedtime.   MiraLax 17 GM/SCOOP powder Generic drug: polyethylene glycol powder Take 17 g by mouth daily.   mirtazapine 7.5 MG tablet Commonly known as: REMERON Take 1 tablet (7.5 mg total) by mouth at bedtime.   Namenda 5 MG tablet Generic drug: memantine Take 5 mg by mouth at bedtime.   pantoprazole 40 MG tablet Commonly known as: Protonix Take 1 tablet (40 mg total) by mouth daily.   potassium chloride 10 MEQ tablet Commonly known as: KLOR-CON M Take 10 mEq by mouth daily.   senna-docusate 8.6-50 MG tablet Commonly known as: Senokot-S Take 1 tablet by mouth at bedtime.   thiamine 100 MG tablet Take 1 tablet (100 mg total) by mouth daily.   Vistaril 25 MG capsule Generic drug: hydrOXYzine Take 25 mg by mouth every 12 (twelve) hours as needed for anxiety.   vitamin B-12 1000 MCG tablet Commonly known as: CYANOCOBALAMIN Take 1 tablet (1,000 mcg total) by mouth daily.   vitamin C 250 MG tablet Commonly known as: ASCORBIC  ACID Take 1 tablet (250 mg total) by mouth daily.       Allergies  Allergen Reactions   Sulfa Antibiotics Other (See Comments)    Reaction:  Unknown    Tramadol Other (See Comments)    seizures      The results of significant diagnostics from this hospitalization (including imaging, microbiology, ancillary and laboratory) are listed below for reference.    Significant Diagnostic Studies: DG Chest 2 View  Result Date: 11/11/2021 CLINICAL DATA:  Weakness, diarrhea, not feeling well EXAM: CHEST - 2 VIEW COMPARISON:  09/16/2021 FINDINGS: Severe thoracic deformity secondary to scoliosis. Enlargement of cardiac silhouette. Atherosclerotic calcification aorta. Minimal LEFT basilar atelectasis. Question mild patchy infiltrate at RIGHT lung base. Remaining lungs clear. No pleural effusion or pneumothorax. Bones demineralized with severe dextroconvex thoracic scoliosis. IMPRESSION: Enlargement of cardiac silhouette with LEFT basilar atelectasis and questionable RIGHT base infiltrate. Electronically Signed   By: Lavonia Dana M.D.   On: 11/11/2021 13:40   CT HEAD WO CONTRAST (5MM)  Result Date: 11/11/2021 CLINICAL DATA:  Head trauma EXAM: CT HEAD WITHOUT CONTRAST TECHNIQUE: Contiguous axial images were obtained from the base of the skull through the vertex without intravenous contrast. RADIATION DOSE REDUCTION: This exam was performed according to the departmental dose-optimization program which includes automated exposure control, adjustment of the mA and/or kV according to patient size and/or use of iterative reconstruction technique. COMPARISON:  CT head 09/16/2021 FINDINGS: Brain: No acute intracranial hemorrhage, mass effect, or herniation. No extra-axial fluid collections. No evidence of acute territorial infarct. No hydrocephalus. 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 Extensive hypodensities throughout the periventricular and  subcortical white matter, likely secondary to chronic microvascular ischemic changes. Vascular: Right vertebrobasilar vascular stent graft again visualized. Skull: No acute fracture identified. Previous right suboccipital craniectomy changes. Sinuses/Orbits: No acute finding. Other: None. IMPRESSION: 1. No acute intracranial process identified. Chronic microvascular ischemic changes. 2. Stable appearance of the known right PICA aneurysm and surgical changes. Electronically Signed   By: Ofilia Neas M.D.   On: 11/11/2021 16:10   CT Cervical Spine Wo Contrast  Result Date: 11/11/2021 CLINICAL DATA:  Neck trauma, not feeling well EXAM: CT CERVICAL SPINE WITHOUT CONTRAST TECHNIQUE: Multidetector CT imaging of the cervical spine was performed  without intravenous contrast. Multiplanar CT image reconstructions were also generated. RADIATION DOSE REDUCTION: This exam was performed according to the departmental dose-optimization program which includes automated exposure control, adjustment of the mA and/or kV according to patient size and/or use of iterative reconstruction technique. COMPARISON:  09/16/2021. FINDINGS: Alignment: Unchanged trace retrolisthesis of C3 on C4 and C4 on C5. Unchanged trace anterolisthesis C5 on C6. Skull base and vertebrae: Redemonstrated right vertebral artery stent. No acute fracture or suspicious osseous lesion. Soft tissues and spinal canal: No prevertebral fluid or swelling. No visible canal hematoma. Disc levels: Disc height loss, most prominently at C5-C6 and C6-C7, without significant spinal canal stenosis. Uncovertebral and facet arthropathy with up to moderate neural foraminal narrowing at multiple levels. Upper chest: Scarring in the right lung apex. No focal pulmonary opacity or pleural effusion. Other: None. IMPRESSION: 1.  No acute fracture or traumatic listhesis in the cervical spine. 2. For discussion of the known right PICA aneurysm and stent, please see same-day CT head.  Electronically Signed   By: Merilyn Baba M.D.   On: 11/11/2021 16:16   MR BRAIN WO CONTRAST  Result Date: 10/26/2021 CLINICAL DATA:  Word-finding difficulty EXAM: MRI HEAD WITHOUT CONTRAST TECHNIQUE: Multiplanar, multiecho pulse sequences of the brain and surrounding structures were obtained without intravenous contrast. COMPARISON:  03/14/2021 brain MRI. FINDINGS: Brain: No acute infarct, mass effect or extra-axial collection. No acute or chronic hemorrhage. Hyperintense T2-weighted signal is moderately widespread throughout the white matter. Advanced atrophy for age. The midline structures are normal. 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 Visualized upper cervical spine and soft tissues are normal. Sinuses/Orbits:No paranasal sinus fluid levels or advanced mucosal thickening. No mastoid or middle ear effusion. Normal orbits. IMPRESSION: 1. No acute intracranial abnormality. 2. Advanced atrophy and sequelae of chronic microvascular ischemia. 3. 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 Electronically Signed   By: Ulyses Jarred M.D.   On: 10/26/2021 21:58    Microbiology: Recent Results (from the past 240 hour(s))  Resp Panel by RT-PCR (Flu A&B, Covid) Nasopharyngeal Swab     Status: None   Collection Time: 11/11/21  3:30 PM   Specimen: Nasopharyngeal Swab; Nasopharyngeal(NP) swabs in vial transport medium  Result Value Ref Range Status   SARS Coronavirus 2 by RT PCR NEGATIVE NEGATIVE Final    Comment: (NOTE) SARS-CoV-2 target nucleic acids are NOT DETECTED.  The SARS-CoV-2 RNA is generally detectable in upper respiratory specimens during the acute phase of infection. The lowest concentration of SARS-CoV-2 viral copies this assay can detect is 138 copies/mL. A negative result does not preclude SARS-Cov-2 infection and should not be used as the sole basis for treatment or other patient  management decisions. A negative result may occur with  improper specimen collection/handling, submission of specimen other than nasopharyngeal swab, presence of viral mutation(s) within the areas targeted by this assay, and inadequate number of viral copies(<138 copies/mL). A negative result must be combined with clinical observations, patient history, and epidemiological information. The expected result is Negative.  Fact Sheet for Patients:  EntrepreneurPulse.com.au  Fact Sheet for Healthcare Providers:  IncredibleEmployment.be  This test is no t yet approved or cleared by the [place] FDA and  has been authorized for detection and/or diagnosis of SARS-CoV-2 by FDA under an Emergency Use Authorization (EUA). This EUA will remain  in effect (meaning this test can be used) for the duration of the COVID-19 declaration under  Section 564(b)(1) of the Act, 21 U.S.C.section 360bbb-3(b)(1), unless the authorization is terminated  or revoked sooner.       Influenza A by PCR NEGATIVE NEGATIVE Final   Influenza B by PCR NEGATIVE NEGATIVE Final    Comment: (NOTE) The Xpert Xpress SARS-CoV-2/FLU/RSV plus assay is intended as an aid in the diagnosis of influenza from Nasopharyngeal swab specimens and should not be used as a sole basis for treatment. Nasal washings and aspirates are unacceptable for Xpert Xpress SARS-CoV-2/FLU/RSV testing.  Fact Sheet for Patients: EntrepreneurPulse.com.au  Fact Sheet for Healthcare Providers: IncredibleEmployment.be  This test is not yet approved or cleared by the [place] FDA and has been authorized for detection and/or diagnosis of SARS-CoV-2 by FDA under an Emergency Use Authorization (EUA). This EUA will remain in effect (meaning this test can be used) for the duration of the COVID-19 declaration under Section 564(b)(1) of the Act, 21 U.S.C. section 360bbb-3(b)(1),  unless the authorization is terminated or revoked.  Performed at [facility], [place] [address], [place], [place] [address]   Blood culture (routine x 2)     Status: None (Preliminary result)   Collection Time: 11/11/21  3:38 PM   Specimen: BLOOD  Result Value Ref Range Status   Specimen Description   Final    BLOOD BLOOD LEFT HAND Performed at [facility] [address] [place], [place] [address]    Special Requests   Final    BOTTLES DRAWN AEROBIC AND ANAEROBIC Blood Culture results may not be optimal due to an inadequate volume of blood received in culture bottles Performed at [facility] [address] [place], [place] [address]    Culture   Final    NO GROWTH 3 DAYS Performed at [facility], [place] [address] [place], [place] [address]    Report Status PENDING  Incomplete     Labs: Basic Metabolic Panel: Recent Labs  Lab 11/11/21 1312 11/12/21 0643 11/13/21 0535 11/14/21 0541  NA 139 136 139 139  K 3.6 4.0 4.4 3.8  CL 106 107 109 107  CO2 26 22 24 26   GLUCOSE 87 146* 95 91  BUN 12 11 9  7*  CREATININE 0.59 0.65 0.70 0.53  CALCIUM 9.4 9.0 9.2 9.4  MG  --   --  1.9  --    Liver Function Tests: Recent Labs  Lab 11/11/21 1312 11/12/21 0643  AST 19 18  ALT 14 13  ALKPHOS 75 69  BILITOT 0.3 0.5  PROT 6.3* 6.0*  ALBUMIN 3.4* 3.2*   No results for input(s): LIPASE, AMYLASE in the last 168 hours. No results for input(s): AMMONIA in the last 168 hours. CBC: Recent Labs  Lab 11/11/21 1312 11/11/21 1716 11/12/21 0643 11/12/21 1718 11/12/21 2047 11/13/21 0535 11/14/21 0541  WBC 4.8  --   --   --  9.8  --  4.9  NEUTROABS 3.1  --   --   --  8.0*  --   --   HGB 9.5*   < > 11.3* 10.2* 9.9* 9.6* 9.3*  HCT 31.0*  --   --   --  32.6*  --  30.0*  MCV 92.8  --   --   --  94.5  --  93.8  PLT 371  --   --   --  317  --  309   < > = values in this interval not displayed.   Cardiac Enzymes: No  results  for input(s): CKTOTAL, CKMB, CKMBINDEX, TROPONINI in the last 168 hours. BNP: BNP (last 3 results) Recent Labs    03/13/21 2314  BNP 30.4    ProBNP (last 3 results) No results for input(s): PROBNP in the last 8760 hours.  CBG: Recent Labs  Lab 11/11/21 1317  GLUCAP 84    FURTHER DISCHARGE INSTRUCTIONS:   Get Medicines reviewed and adjusted: Please take all your medications with you for your next visit with your Primary MD   Laboratory/radiological data: Please request your Primary MD to go over all hospital tests and procedure/radiological results at the follow up, please ask your Primary MD to get all Hospital records sent to his/her office.   In some cases, they will be blood work, cultures and biopsy results pending at the time of your discharge. Please request that your primary care M.D. goes through all the records of your hospital data and follows up on these results.   Also Note the following: If you experience worsening of your admission symptoms, develop shortness of breath, life threatening emergency, suicidal or homicidal thoughts you must seek medical attention immediately by calling 911 or calling your MD immediately  if symptoms less severe.   You must read complete instructions/literature along with all the possible adverse reactions/side effects for all the Medicines you take and that have been prescribed to you. Take any new Medicines after you have completely understood and accpet all the possible adverse reactions/side effects.    Do not drive when taking Pain medications or sleeping medications (Benzodaizepines)   Do not take more than prescribed Pain, Sleep and Anxiety Medications. It is not advisable to combine anxiety,sleep and pain medications without talking with your primary care practitioner   Special Instructions: If you have smoked or chewed Tobacco  in the last 2 yrs please stop smoking, stop any regular Alcohol  and or any Recreational drug  use.   Wear Seat belts while driving.   Please note: You were cared for by a hospitalist during your hospital stay. Once you are discharged, your primary care physician will handle any further medical issues. Please note that NO REFILLS for any discharge medications will be authorized once you are discharged, as it is imperative that you return to your primary care physician (or establish a relationship with a primary care physician if you do not have one) for your post hospital discharge needs so that they can reassess your need for medications and monitor your lab values.     Signed:  Florencia Reasons MD, PhD, FACP  [facility] 11/14/2021, 12:38 PM

## 2021-11-14 NOTE — Anesthesia Preprocedure Evaluation (Addendum)
Anesthesia Evaluation  Patient identified by MRN, date of birth, ID band Patient awake    Reviewed: Allergy & Precautions, H&P , NPO status , Patient's Chart, lab work & pertinent test results  History of Anesthesia Complications (+) PONV and history of anesthetic complications  Airway Mallampati: II  TM Distance: >3 FB Neck ROM: Full    Dental no notable dental hx. (+) Dental Advisory Given   Pulmonary neg pulmonary ROS,    Pulmonary exam normal        Cardiovascular hypertension, Normal cardiovascular exam     Neuro/Psych  Headaches, Seizures -, Well Controlled,  PSYCHIATRIC DISORDERS Anxiety Depression CVA    GI/Hepatic Neg liver ROS, GERD  Medicated,  Endo/Other  negative endocrine ROS  Renal/GU negative Renal ROS  negative genitourinary   Musculoskeletal  (+) Arthritis , Osteoarthritis,    Abdominal   Peds  Hematology  (+) Blood dyscrasia, anemia ,   Anesthesia Other Findings   Reproductive/Obstetrics negative OB ROS                            Anesthesia Physical  Anesthesia Plan  ASA: 3  Anesthesia Plan: MAC   Post-op Pain Management: Minimal or no pain anticipated   Induction: Intravenous, Rapid sequence and Cricoid pressure planned  PONV Risk Score and Plan: 2 and Ondansetron, Treatment may vary due to age or medical condition and Propofol infusion  Airway Management Planned: Natural Airway and Simple Face Mask  Additional Equipment:   Intra-op Plan:   Post-operative Plan:   Informed Consent: I have reviewed the patients History and Physical, chart, labs and discussed the procedure including the risks, benefits and alternatives for the proposed anesthesia with the patient or authorized representative who has indicated his/her understanding and acceptance.     Dental advisory given  Plan Discussed with: Anesthesiologist  Anesthesia Plan Comments:         Anesthesia Quick Evaluation

## 2021-11-14 NOTE — [place] FL2 (Signed)
[place] MEDICAID FL2 LEVEL OF CARE SCREENING TOOL     IDENTIFICATION  Patient Name: Elaine Rivera Birthdate: 1946/01/25 Sex: female Admission Date (Current Location): 11/11/2021  [facility] and [place] Number:  Herbalist and Address:  [facility],  [place] [address], [place]      Provider Number: [id number]  Attending Physician Name and Address:  Florencia Reasons, MD  Relative Name and Phone Number:       Current Level of Care: Hospital Recommended Level of Care: [place] Prior Approval Number:    Date Approved/Denied:   PASRR Number:    Discharge Plan: Other (Comment) (Assisted living)    Current Diagnoses: Patient Active Problem List   Diagnosis Date Noted   FTT (failure to thrive) in adult 11/13/2021   GI bleed 11/13/2021   Acute lower GI bleeding 11/11/2021   Acute blood loss anemia 11/11/2021   Melena 11/11/2021   Seizure disorder ([place]) 10/27/2021   Protein-calorie malnutrition, severe 08/26/2021   Failure to thrive in adult 08/23/2021   Right 10th rib fracture 08/23/2021   Physical deconditioning 08/23/2021   Bad headache 03/14/2021   Seizure ([place]) 03/06/2021   CVA (cerebral vascular accident) ([place]) 03/06/2021   Ischemic cerebrovascular accident (CVA) ([place]) 03/05/2021   Hypokalemia 03/05/2021   Anxiety 03/05/2021   Transient speech disturbance 03/05/2021   Lung nodule 03/05/2021   Aphasia    Fall    Anemia 02/28/2020   Shortness of breath 01/24/2020   Dizziness 01/24/2020   Herpes 10/04/2018   Cerebral aneurysm 09/05/2018   NSAID long-term use 04/20/2016   Scoliosis 04/20/2016   Depression 04/20/2016   HTN (hypertension) 04/20/2016   GERD (gastroesophageal reflux disease) 04/20/2016   Occult GI bleeding 04/20/2016    Orientation RESPIRATION BLADDER Height & Weight     Self, Time, Situation, Place   (Regular) Incontinent Weight: 39.5 kg Height:  4\' 11"  (149.9 cm)  BEHAVIORAL SYMPTOMS/MOOD  NEUROLOGICAL BOWEL NUTRITION STATUS      Incontinent  (Regular)  AMBULATORY STATUS COMMUNICATION OF NEEDS Skin   Limited Assist Verbally Normal                       Personal Care Assistance Level of Assistance  Bathing, Feeding, Dressing Bathing Assistance: Limited assistance Feeding assistance: Independent Dressing Assistance: Limited assistance     Functional Limitations Info  Sight, Hearing Sight Info: Adequate Hearing Info: Adequate      SPECIAL CARE FACTORS FREQUENCY  PT (By licensed PT), OT (By licensed OT)     PT Frequency: 3 x weekly OT Frequency: 3 x weekly            Contractures Contractures Info: Not present    Additional Factors Info  Code Status Code Status Info: Full Allergies Info: Sulfa Antibiotics, Tramadol           Current Medications (11/14/2021):  This is the current hospital active medication list Current Facility-Administered Medications  Medication Dose Route Frequency Provider Last Rate Last Admin   acetaminophen (TYLENOL) tablet 1,000 mg  1,000 mg Oral BID Florencia Reasons, MD   1,000 mg at 11/14/21 1135   busPIRone (BUSPAR) tablet 7.5 mg  7.5 mg Oral BID Florencia Reasons, MD   7.5 mg at 11/14/21 1136   cyanocobalamin ((VITAMIN B-12)) injection 1,000 mcg  1,000 mcg Intramuscular Daily Charlynne Cousins, MD   1,000 mcg at 11/14/21 1123   cyclobenzaprine (FLEXERIL) tablet 5 mg  5 mg Oral TID  PRN Florencia Reasons, MD       feeding supplement (ENSURE ENLIVE / ENSURE PLUS) liquid 237 mL  237 mL Oral BID BM Florencia Reasons, MD       folic acid (FOLVITE) tablet 1 mg  1 mg Oral Daily Charlynne Cousins, MD   1 mg at 11/14/21 1136   influenza vaccine adjuvanted (FLUAD) injection 0.5 mL  0.5 mL Intramuscular Tomorrow-1000 Donne Hazel, MD       lactated ringers infusion   Intravenous Continuous Florencia Reasons, MD 75 mL/hr at 11/13/21 1727 New Bag at 11/13/21 1727   levETIRAcetam (KEPPRA) tablet 750 mg  750 mg Oral BID Florencia Reasons, MD       lidocaine (LIDODERM) 5 % 1 patch   1 patch Transdermal Q24H Florencia Reasons, MD   1 patch at 11/13/21 2122   lip balm (CARMEX) ointment 1 application  1 application Topical PRN Florencia Reasons, MD       loperamide (IMODIUM) capsule 2 mg  2 mg Oral Q6H PRN Florencia Reasons, MD       melatonin tablet 3 mg  3 mg Oral QHS Florencia Reasons, MD   3 mg at 11/13/21 2120   memantine (NAMENDA) tablet 5 mg  5 mg Oral QHS Florencia Reasons, MD   5 mg at 11/13/21 2119   mirtazapine (REMERON) tablet 7.5 mg  7.5 mg Oral QHS Charlynne Cousins, MD   7.5 mg at 11/13/21 2119   ondansetron (ZOFRAN) tablet 4 mg  4 mg Oral Q6H PRN Charlynne Cousins, MD       Or   ondansetron [facility]) injection 4 mg  4 mg Intravenous Q6H PRN Charlynne Cousins, MD   4 mg at 11/14/21 1214   pantoprazole (PROTONIX) injection 40 mg  40 mg Intravenous Q12H Charlynne Cousins, MD   40 mg at 11/14/21 1123   pneumococcal 23 valent vaccine (PNEUMOVAX-23) injection 0.5 mL  0.5 mL Intramuscular Tomorrow-1000 Donne Hazel, MD       senna-docusate (Senokot-S) tablet 1 tablet  1 tablet Oral Vonzell Schlatter, MD   1 tablet at 11/13/21 2120     Discharge Medications: Medication List       STOP taking these medications     meclizine 12.5 MG tablet Commonly known as: ANTIVERT           TAKE these medications     acetaminophen 500 MG tablet Commonly known as: TYLENOL Take 1,000 mg by mouth in the morning and at bedtime.    aspirin EC 81 MG tablet Take 1 tablet (81 mg total) by mouth daily. Please hold this medication, resume when okay with GI Dr. Collene Mares What changed: additional instructions    busPIRone 7.5 MG tablet Commonly known as: BUSPAR Take 7.5 mg by mouth 2 (two) times daily.    Colace 100 MG capsule Generic drug: docusate sodium Take 100 mg by mouth daily as needed for moderate constipation.    Ensure Plus Liqd Take 237 mLs by mouth 2 (two) times daily between meals.    ferrous sulfate 325 (65 FE) MG EC tablet Take 1 tablet (325 mg total) by mouth daily with breakfast.    folic  acid 1 MG tablet Commonly known as: FOLVITE Take 1 tablet (1 mg total) by mouth daily. Start taking on: November 15, 2021    levETIRAcetam 750 MG tablet Commonly known as: Keppra Take 1 tablet (750 mg total) by mouth 2 (two) times daily.    Lidocaine  4 % Ptch Apply 1 patch topically daily.    melatonin 3 MG Tabs tablet Take 3 mg by mouth at bedtime.    MiraLax 17 GM/SCOOP powder Generic drug: polyethylene glycol powder Take 17 g by mouth daily.    mirtazapine 7.5 MG tablet Commonly known as: REMERON Take 1 tablet (7.5 mg total) by mouth at bedtime.    Namenda 5 MG tablet Generic drug: memantine Take 5 mg by mouth at bedtime.    pantoprazole 40 MG tablet Commonly known as: Protonix Take 1 tablet (40 mg total) by mouth daily.    potassium chloride 10 MEQ tablet Commonly known as: KLOR-CON M Take 10 mEq by mouth daily.    senna-docusate 8.6-50 MG tablet Commonly known as: Senokot-S Take 1 tablet by mouth at bedtime.    thiamine 100 MG tablet Take 1 tablet (100 mg total) by mouth daily.    Vistaril 25 MG capsule Generic drug: hydrOXYzine Take 25 mg by mouth every 12 (twelve) hours as needed for anxiety.    vitamin B-12 1000 MCG tablet Commonly known as: CYANOCOBALAMIN Take 1 tablet (1,000 mcg total) by mouth daily.    vitamin C 250 MG tablet Commonly known as: ASCORBIC ACID Take 1 tablet (250 mg total) by mouth daily.          Relevant Imaging Results:  Relevant Lab Results:   Additional Information SS#: [id number]  Francesa Eugenio, Marjie Skiff, RN

## 2021-11-14 NOTE — Transfer of Care (Signed)
Immediate Anesthesia Transfer of Care Note  Patient: Elaine Rivera  Procedure(s) Performed: ENTEROSCOPY  Patient Location: PACU  Anesthesia Type:MAC  Level of Consciousness: sedated  Airway & Oxygen Therapy: Patient Spontanous Breathing  Post-op Assessment: Report given to RN and Post -op Vital signs reviewed and stable  Post vital signs: Reviewed and stable  Last Vitals:  Vitals Value Taken Time  BP 152/75 11/14/21 0924  Temp    Pulse 81 11/14/21 0925  Resp 23 11/14/21 0925  SpO2 98 % 11/14/21 0925  Vitals shown include unvalidated device data.  Last Pain:  Vitals:   11/14/21 0841  TempSrc: Temporal  PainSc: 0-No pain      Patients Stated Pain Goal: 0 (40/37/09 6438)  Complications: No notable events documented.

## 2021-11-14 NOTE — Plan of Care (Signed)
Discharge instructions given to the patient and her daughter including medications.

## 2021-11-14 NOTE — TOC Progression Note (Addendum)
Transition of Care [facility]) - Progression Note    Patient Details  Name: Elaine Rivera MRN: [id number] Date of Birth: Aug 21, 1946  Transition [facility]) CM/SW Contact  Anamarie Hunn, Marjie Skiff, RN Phone Number: 11/14/2021, 11:02 AM  Clinical Narrative:    [facility] consult for home health PT and Outpatient Palliative services at ALF when pt discharges. Spoke with pt to offer choice for both services. Pt chose [facility] for outpatient palliative. Referral given to [place] liaison.    Pt states she was working with PT prior to admission but doesn't know what company she was with. Spoke with nurse at [facility] who states that pt was active with [facility]. [facility] liaison contacted to alert of need for continued services at dc.   Pt to dc back to [place]. Pt states that daughter can come and pick her up after 4pm. She states she will call her daughter. FL2 and dc summary to be faxed to [place]. RN to call report to [phone]  Expected Discharge Plan: Assisted Living Barriers to Discharge: Continued Medical Work up  Expected Discharge Plan and Services Expected Discharge Plan: Assisted Living   Discharge Planning Services: CM Consult   Living arrangements for the past 2 months: [place]                     Readmission Risk Interventions Readmission Risk Prevention Plan 11/13/2021  Transportation Screening Complete  PCP or Specialist Appt within 5-7 Days Complete  Home Care Screening Complete  Medication Review (RN CM) Complete  Some recent data might be hidden

## 2021-11-14 NOTE — Plan of Care (Signed)
°  Problem: Clinical Measurements: Goal: Ability to maintain clinical measurements within normal limits will improve Outcome: Progressing   Problem: Activity: Goal: Risk for activity intolerance will decrease Outcome: Progressing   Problem: Elimination: Goal: Will not experience complications related to bowel motility Outcome: Progressing   Problem: Nutrition: Goal: Adequate nutrition will be maintained Outcome: Progressing

## 2021-11-14 NOTE — Progress Notes (Signed)
PT Cancellation Note  Patient Details Name: Elaine Rivera MRN: [id number] DOB: March 02, 1946   Cancelled Treatment:    Reason Eval/Treat Not Completed: Patient at procedure or test/unavailable. Pt at endoscopy. Will check back tomorrow for eval.    Tori Primrose Oler PT, DPT 11/14/21, 9:55 AM

## 2021-11-14 NOTE — Op Note (Signed)
[facility] Patient Name: Elaine Rivera Procedure Date: 11/14/2021 MRN: [id number] Attending MD: Carol Ada , MD Date of Birth: [birth year] CSN: [id number] Age: 76 Admit Type: Outpatient Procedure:                Small bowel enteroscopy Indications:              Iron deficiency anemia, Melena, Occult blood in                            stool Providers:                Carol Ada, MD, Grace Isaac, RN, [facility]                            Technician, Technician Referring MD:              Medicines:                Propofol per Anesthesia Complications:            No immediate complications. Estimated Blood Loss:     Estimated blood loss: none. Procedure:                Pre-Anesthesia Assessment:                           - Prior to the procedure, a History and Physical                            was performed, and patient medications and                            allergies were reviewed. The patient's tolerance of                            previous anesthesia was also reviewed. The risks                            and benefits of the procedure and the sedation                            options and risks were discussed with the patient.                            All questions were answered, and informed consent                            was obtained. Prior Anticoagulants: The patient has                            taken no previous anticoagulant or antiplatelet                            agents. ASA Grade Assessment: III - A patient with                            severe  systemic disease. After reviewing the risks                            and benefits, the patient was deemed in                            satisfactory condition to undergo the procedure.                           - Sedation was administered by an anesthesia                            professional. Deep sedation was attained.                           After obtaining informed consent, the  endoscope was                            passed under direct vision. Throughout the                            procedure, the patient's blood pressure, pulse, and                            oxygen saturations were monitored continuously. The                            [id number]-HQ190L ([id number]) Olympus colonoscope was                            introduced through the mouth and advanced to the                            small bowel distal to the Ligament of Treitz. The                            small bowel enteroscopy was accomplished without                            difficulty. The patient tolerated the procedure                            well. Findings:      One benign-appearing, intrinsic mild stenosis was found at the       gastroesophageal junction. This stenosis measured 1.2 cm (inner       diameter) x less than one cm (in length). The stenosis was traversed.      A 5 cm hiatal hernia was present.      The gastroesophageal flap valve was visualized endoscopically and       classified as Hill Grade IV (no fold, wide open lumen, hiatal hernia       present).      Multiple dispersed small erosions with no stigmata of recent bleeding       were found in the gastric body and in the gastric antrum.  The examined duodenum was normal.      There was no evidence of significant pathology in the entire examined       portion of jejunum.      Deep intubation of the jejunum was achieved twice. Careful examination       of the mucosa both times did not find any lesion corresponding to the       VCE. There was no evidence of any bleeding during this procedure. Impression:               - Benign-appearing esophageal stenosis.                           - 5 cm hiatal hernia.                           - Gastroesophageal flap valve classified as Hill                            Grade IV (no fold, wide open lumen, hiatal hernia                            present).                           - Erosive  gastropathy with no stigmata of recent                            bleeding.                           - Normal examined duodenum.                           - The examined portion of the jejunum was normal.                           - No specimens collected. Recommendation:           - Return patient to hospital ward for ongoing care.                           - Resume regular diet.                           - PPI QD for the gastric erosions.                           - Follow up with Dr. Collene Mares in 2-4 weeks. Procedure Code(s):        --- Professional ---                           [phone], Small intestinal endoscopy, enteroscopy                            beyond second portion of duodenum, not including  ileum; diagnostic, including collection of                            specimen(s) by brushing or washing, when performed                            (separate procedure) Diagnosis Code(s):        --- Professional ---                           D50.9, Iron deficiency anemia, unspecified                           R19.5, Other fecal abnormalities                           K92.1, Melena (includes Hematochezia)                           K22.2, Esophageal obstruction                           K44.9, Diaphragmatic hernia without obstruction or                            gangrene                           K31.89, Other diseases of stomach and duodenum CPT copyright 2019 American [employer] Association. All rights reserved. The codes documented in this report are preliminary and upon coder review may  be revised to meet current compliance requirements. Carol Ada, MD Carol Ada, MD 11/14/2021 9:37:56 AM This report has been signed electronically. Number of Addenda: 0

## 2021-11-14 NOTE — Progress Notes (Signed)
Hydrologist [facility])  Hospital Liaison: RN note       Notified by [facility] manager of patient/family request for [facility] Palliative services at [facility] after discharge.       Writer spoke with daughter [place] to confirm interest and explain services.             [place] Palliative team will follow up with patient after discharge.       Please call with any hospice or palliative related questions.       Thank you for this referral.       Clementeen Hoof, BSN, RN [place] (listed on [place] under [facility] [place] [facility] [phone]  [phone]

## 2021-11-14 NOTE — Interval H&P Note (Signed)
History and Physical Interval Note:  11/14/2021 8:47 AM  Elaine Rivera  has presented today for surgery, with the diagnosis of GI bleed.  The various methods of treatment have been discussed with the patient and family. After consideration of risks, benefits and other options for treatment, the patient has consented to  Procedure(s): ENTEROSCOPY (N/A) as a surgical intervention.  The patient's history has been reviewed, patient examined, no change in status, stable for surgery.  I have reviewed the patient's chart and labs.  Questions were answered to the patient's satisfaction.     Corina Stacy D

## 2021-11-16 LAB — CULTURE, BLOOD (ROUTINE X 2): Culture: NO GROWTH

## 2021-11-17 ENCOUNTER — Encounter (HOSPITAL_COMMUNITY): Payer: Self-pay | Admitting: Gastroenterology

## 2021-12-30 ENCOUNTER — Non-Acute Institutional Stay: Payer: Medicare PPO | Admitting: Family Medicine

## 2021-12-30 ENCOUNTER — Encounter: Payer: Self-pay | Admitting: Family Medicine

## 2021-12-30 VITALS — BP 118/76 | HR 74 | Temp 98.3°F | Resp 16

## 2021-12-30 DIAGNOSIS — D62 Acute posthemorrhagic anemia: Secondary | ICD-10-CM

## 2021-12-30 DIAGNOSIS — E43 Unspecified severe protein-calorie malnutrition: Secondary | ICD-10-CM

## 2021-12-30 DIAGNOSIS — Z515 Encounter for palliative care: Secondary | ICD-10-CM

## 2021-12-30 NOTE — Progress Notes (Signed)
? ? ?Manufacturing engineer ?Community Palliative Care Consult Note ?Telephone: [phone]  ?Fax: [phone]  ? ?Date of encounter: 12/30/21 ?9:50 am ?PATIENT NAME: Elaine Rivera ?[place] [id number]   ?[phone] (home)  ?DOB: 01-Oct-1945 ?MRN: [id number] ?PRIMARY CARE PROVIDER:    ?System, Provider Not In,  ?No address on file ?None ? ?REFERRING PROVIDER:   ?No referring provider defined for this encounter. ?N/A ? ?RESPONSIBLE PARTY:    ?Contact Information   ? ? Name Relation Home Work Mobile  ? Lamping,Brittany Daughter [phone]  ? Rankin,Everette Significant other   [phone]  ? Mordecai Maes Brother   [phone]  ? ?  ? ? ? ?I met face to face with patient in [facility] living facility. Palliative Care was asked to follow this patient by consultation request of Dr Reymundo Poll  to address advance care planning and complex medical decision making. This is the initial visit.  ? ? ?      ASSESSMENT, SYMPTOM MANAGEMENT AND PLAN / RECOMMENDATIONS:  ? Acute blood loss anemia ?Hemodynamically stable. ?Agree with continuation of daily PPI as prevention ?Avoid NSAID or ASA use (other than low dose ASA) ? ? Severe Protein Calorie malnutrition ?Appetite improved with some modest weight gain. ?Continue to encourage protein supplement  with Ensure Plus BID and socialization with meals. ?Agree with Remeron 7.5 mg as will help with stimulation of appetite ? ? Palliative Care Encounter ?Requested pt have daughter contact provider (pt states daughter works difficult schedule) ?Need to address goals of care with pt's daughter ? ? ?Follow up Palliative Care Visit: Palliative care will continue to follow for complex medical decision making, advance care planning, and clarification of goals. Return 4 weeks or prn. ? ? ? ?This visit was coded based on medical decision making (MDM). ? ?PPS: 60% ? ?HOSPICE ELIGIBILITY/DIAGNOSIS: TBD ? ?Chief Complaint:   ?[facility] received a referral to follow up wtih patient for chronic disease management with recent GI bleed and gastric erosion on EGD started on PPI therapy. Follow up is also for advance directive planning and defining/refining goals of care. ? ?HISTORY OF PRESENT ILLNESS:  Elaine Rivera is a 76 y.o. year old female with recent hospitalization in mid February for acute GI bleed with gastric erosion identified on EGD. Last HGB 9.3 on 11/14/21 with HCT 30% and low albumin 3.2. Enteroscopy done by Dr Carol Ada on 11/14/21 showed small segment benign appearing esophageal stenosis, 5 cm hiatal hernia, GE flap valve classified as Hill Grade IV, erosive gastropathy with recommendations for daily PPI therapy and no recent evidence of bleeding and portion of jejunum visualized was normal.  She also has hx of CVA, cerebral aneurysm, HTN, seizure disorder, scoliosis, hx of herpes, hypokalemia, aphasia and failure to thrive.  Pt reports weight loss over the last year of 40 lbs and recent weight gain of 7 lbs.  She reports fall about 2 months ago with possible concussion and continued frequent headaches.  Reports hx of scoliosis with chronic back pain.  States appetite improved, walking some with walker but self propels in WC to meals.  States independence with bathing/dressing/toileting.  No issues with incontinence of bowel or bladder.  Has occasional constipation she manages with bananas, granola and raisins.  Reports mood is good but she becomes "easily agitated".  Became tearful when discussing losing her mother in her 14s with widespread cancer and death of her father by suicide.  Facility staff says pt  is pretty independent with her care, is social at mealtimes with other residents and appetite is good (much improved since initial admission).  She has no problem with pt swallowing or taking her medication. ?Pt states she plans to give her car to her boyfriend and get another car since  hers is too short for her. She asked provider to let daughter contact her due to difficult work schedule. ? ?History obtained from review of EMR, discussion with  facility staff and/or Ms. Salahuddin.  ?I reviewed available labs, medications, imaging, studies and related documents from the EMR.  Records reviewed and summarized above.  ? ?ROS ?General: NAD ?EYES: denies vision changes ?ENMT: denies dysphagia ?Cardiovascular: denies chest pain, denies DOE ?Pulmonary: denies cough, denies increased SOB ?Abdomen: endorses improved appetite with recent 7 lb weight gain, endorses occasional constipation managed with dietary fiber intake, endorses continence of bowel ?GU: denies dysuria, endorses continence of urine ?MSK:  denies increased weakness, no falls in last 2 months, uses walker or wheelchair ?Skin: denies rashes or wounds ?Neurological: endorses chronic back pain and frequent headache, denies insomnia ?Psych: Endorses positive mood with easy agitation ?Heme/lymph/immuno: denies bruises, abnormal bleeding ? ?Physical Exam: ?Current and past weights: 87 lbs on 11/14/21, prior weight on 03/20/21 was 127 lbs ?Constitutional: NAD ?General: frail appearing, thin ?EYES: anicteric sclera, lids intact, no discharge  ?ENMT: intact hearing, oral mucous membranes moist, dentition intact ?CV: S1S2, RRR with LUSB SM, no LE edema ?Pulmonary: CTAB, no increased work of breathing, no cough, room air ?Abdomen: hypo-active BS + 4 quadrants, soft and non tender, no ascites ?GU: deferred ?MSK: noted sarcopenia, moves all extremities,  noted prominent scapular winging on right with scoliosis and kyphosis, ambulatory with walker short distances otherwise uses wc and self propels ?Skin: warm and dry, no rashes or wounds on visible skin ?Neuro:  no generalized weakness, no cognitive impairment ?Psych: non-anxious affect, intermittently tearful, A and O x 3 ?Hem/lymph/immuno: no widespread bruising ? ?CURRENT PROBLEM LIST:  ?Patient Active  Problem List  ? Diagnosis Date Noted  ? FTT (failure to thrive) in adult 11/13/2021  ? GI bleed 11/13/2021  ? Acute lower GI bleeding 11/11/2021  ? Acute blood loss anemia 11/11/2021  ? Melena 11/11/2021  ? Seizure disorder ([place]) 10/27/2021  ? Protein-calorie malnutrition, severe 08/26/2021  ? Failure to thrive in adult 08/23/2021  ? Right 10th rib fracture 08/23/2021  ? Physical deconditioning 08/23/2021  ? Bad headache 03/14/2021  ? Seizure ([place]) 03/06/2021  ? CVA (cerebral vascular accident) ([place]) 03/06/2021  ? Ischemic cerebrovascular accident (CVA) ([place]) 03/05/2021  ? Hypokalemia 03/05/2021  ? Anxiety 03/05/2021  ? Transient speech disturbance 03/05/2021  ? Lung nodule 03/05/2021  ? Aphasia   ? Fall   ? Anemia 02/28/2020  ? Shortness of breath 01/24/2020  ? Dizziness 01/24/2020  ? Herpes 10/04/2018  ? Cerebral aneurysm 09/05/2018  ? NSAID long-term use 04/20/2016  ? Scoliosis 04/20/2016  ? Depression 04/20/2016  ? HTN (hypertension) 04/20/2016  ? GERD (gastroesophageal reflux disease) 04/20/2016  ? Occult GI bleeding 04/20/2016  ? ?PAST MEDICAL HISTORY:  ?Active Ambulatory Problems  ?  Diagnosis Date Noted  ? NSAID long-term use 04/20/2016  ? Scoliosis 04/20/2016  ? Depression 04/20/2016  ? HTN (hypertension) 04/20/2016  ? GERD (gastroesophageal reflux disease) 04/20/2016  ? Occult GI bleeding 04/20/2016  ? Cerebral aneurysm 09/05/2018  ? Herpes 10/04/2018  ? Shortness of breath 01/24/2020  ? Dizziness 01/24/2020  ? Anemia 02/28/2020  ? Ischemic cerebrovascular accident (CVA) ([place])  03/05/2021  ? Hypokalemia 03/05/2021  ? Anxiety 03/05/2021  ? Transient speech disturbance 03/05/2021  ? Lung nodule 03/05/2021  ? Aphasia   ? Fall   ? Seizure ([place]) 03/06/2021  ? CVA (cerebral vascular accident) ([place]) 03/06/2021  ? Bad headache 03/14/2021  ? Failure to thrive in adult 08/23/2021  ? Right 10th rib fracture 08/23/2021  ? Physical deconditioning 08/23/2021  ? Protein-calorie malnutrition, severe 08/26/2021  ? Seizure  disorder ([place]) 10/27/2021  ? Acute lower GI bleeding 11/11/2021  ? Acute blood loss anemia 11/11/2021  ? Melena 11/11/2021  ? FTT (failure to thrive) in adult 11/13/2021  ? GI bleed 11/13/2021  ? ?Resolved

## 2022-03-25 ENCOUNTER — Ambulatory Visit: Payer: Self-pay | Admitting: General Surgery

## 2022-03-25 NOTE — H&P (Signed)
Chief Complaint: New Patient (Rt inguinal hernia )       History of Present Illness: Elaine Rivera is a 76 y.o. female who is seen today as an office consultation at the request of Dr. Royston Sinner for evaluation of New Patient (Rt inguinal hernia ) .   Patient is a 76 year old female, fairly healthy who comes in secondary to a right-sided hernia. Patient states that she was recently diagnosed with a hernia on her visit to her assisted living center.  She states that she initially thought she had scar tissue from the previous tubal pregnancy.  She has had previous hysterectomy as well. Patient was seen by PA there and was found to have a hernia.  She was also referred to GYN and then referred here for further evaluation.   Patient states that she does have some pain.  She states that she does have to take Tylenol for the pain.  She states this is fairly random and sporadic.  Patient has had no signs or symptoms of incarceration or strangulation.     Review of Systems: A complete review of systems was obtained from the patient.  I have reviewed this information and discussed as appropriate with the patient.  See HPI as well for other ROS.   Review of Systems  Constitutional:  Negative for fever.  HENT:  Negative for congestion.   Eyes:  Negative for blurred vision.  Respiratory:  Negative for cough, shortness of breath and wheezing.   Cardiovascular:  Negative for chest pain and palpitations.  Gastrointestinal:  Negative for heartburn.  Genitourinary:  Negative for dysuria.  Musculoskeletal:  Negative for myalgias.  Skin:  Negative for rash.  Neurological:  Negative for dizziness and headaches.  Psychiatric/Behavioral:  Negative for depression and suicidal ideas.   All other systems reviewed and are negative.      Medical History: Past Medical History Past Medical History: Diagnosis Date  Aneurysm (CMS-HCC)    Anxiety    Depression    GERD (gastroesophageal reflux disease)     History of stroke    Kidney stones    PONV (postoperative nausea and vomiting)    Seizures (CMS-HCC)    Thyroid disease        Patient Active Problem List Diagnosis  Intermittent monocular esotropia of right eye  Diplopia  Brain aneurysm s/p surgery 1998  Divergence insufficiency  Hyperparathyroidism (CMS-HCC)  S/P left lateral rectus resection of 93m, left medial rectus recession 561m(Dr. FrJalene Mullet2/05/2017)     Past Surgical History Past Surgical History: Procedure Laterality Date  TONSILLECTOMY   1953  TUBAL LIGATION Bilateral [place] (INTRACRANIAL APPROACH) Right 1998  VOCAL CORD MEDIALIZATION Right 04/2007  LITHOTRIPSY   2013  EGD   03/2016  STRABISMUS SURGERY 2 HORIZONTAL MUSCLES Left 11/06/2016   Procedure: left medial rectus recession 5.69m53mleft lateral rectus resection 5.69mm21mSurgeon: SharJanene Harvey;  [facility] [place];  Laterality: Left;  COLONOSCOPY      HYSTERECTOMY      ORIF WRIST FRACTURE Left ~2000  PARATHYROIDECTOMY   ~2002      Allergies Allergies Allergen Reactions  Sulfur (Bulk) Other (See Comments)     Pt. Took as as child. Patient stated, "I was told as a child never to take sulfa drugs again. I don't know what happens if it was given to me."      Current Outpatient Medications on  File Prior to Visit Medication Sig Dispense Refill  busPIRone (BUSPAR) 7.5 MG tablet Take 7.5 mg by mouth 2 (two) times daily      cyanocobalamin (VITAMIN B12) 1000 MCG tablet Take by mouth      potassium chloride (KLOR-CON) 10 MEQ ER tablet        acyclovir (ZOVIRAX) 400 MG tablet Take 400 mg by mouth 3 (three) times daily as needed.         ALPRAZolam (XANAX) 0.5 MG tablet Take 1 tablet by mouth once daily as needed.      2  ARIPiprazole (ABILIFY) 5 MG tablet Take 5 mg by mouth nightly.      0  DULoxetine (CYMBALTA) 60 MG DR  capsule Take 120 mg by mouth nightly.      erythromycin [facility]) ophthalmic ointment Place into the left eye every evening. If using drops apply ointment 5 minutes after last drop. Start after surgery 1 g 2  QUEtiapine (SEROQUEL) 25 MG tablet Take 1 tablet by mouth nightly.      5   No current facility-administered medications on file prior to visit.     Family History Family History Problem Relation Age of Onset  Breast cancer Mother    High blood pressure (Hypertension) Father    Hyperlipidemia (Elevated cholesterol) Father    Coronary Artery Disease (Blocked arteries around heart) Father    Diabetes Father    High blood pressure (Hypertension) Brother    Hyperlipidemia (Elevated cholesterol) Brother    Diabetes Brother    Coronary Artery Disease (Blocked arteries around heart) Brother    Anesthesia problems Neg Hx        Social History   Tobacco Use Smoking Status Never Smokeless Tobacco Never     Social History Social History    Socioeconomic History  Marital status: Divorced Tobacco Use  Smoking status: Never  Smokeless tobacco: Never Substance and Sexual Activity  Alcohol use: Never     Comment: ~5 drinks/year  Drug use: Never      Objective:     Vitals:   03/25/22 1340 BP: 96/60 Pulse: 100 Temp: 36.8 C (98.3 F) SpO2: 98% Weight: (!) 41.4 kg (91 lb 3.2 oz) Height: 149.9 cm ('4\' 11"'$ )   Body mass index is 18.42 kg/m. Physical Exam Constitutional:      Appearance: Normal appearance.  HENT:     Head: Normocephalic and atraumatic.     Mouth/Throat:     Mouth: Mucous membranes are moist.     Pharynx: Oropharynx is clear.  Eyes:     General: No scleral icterus.    Pupils: Pupils are equal, round, and reactive to light.  Cardiovascular:     Rate and Rhythm: Normal rate and regular rhythm.     Pulses: Normal pulses.     Heart sounds: No murmur heard.   No friction rub. No gallop.  Pulmonary:     Effort: Pulmonary effort is normal. No  respiratory distress.     Breath sounds: Normal breath sounds. No stridor.  Abdominal:     General: Abdomen is flat.     Hernia: A hernia is present. Hernia is present in the right inguinal area.  Musculoskeletal:        General: No swelling.  Skin:    General: Skin is warm.  Neurological:     General: No focal deficit present.     Mental Status: She is alert and oriented to person, place, and time. Mental status is at baseline.  Psychiatric:        Mood and Affect: Mood normal.        Thought Content: Thought content normal.        Judgment: Judgment normal.        Assessment and Plan: Diagnoses and all orders for this visit:   Non-recurrent unilateral femoral hernia without obstruction or gangrene     Elaine Rivera is a 77 y.o. female    1.  We will proceed to the OR for a laparoscopic right femoral hernia repair with mesh. 2. All risks and benefits were discussed with the patient, to generally include infection, bleeding, damage to surrounding structures, acute and chronic nerve pain, and recurrence. Alternatives were offered and described.  All questions were answered and the patient voiced understanding of the procedure and wishes to proceed at this point.             No follow-ups on file.   Ralene Ok, MD, [facility], [facility]

## 2022-05-28 NOTE — Patient Instructions (Signed)
DUE TO SPACE LIMITATIONS, ONLY TWO VISITORS  (aged 76 and older) ARE ALLOWED TO COME WITH YOU AND STAY IN THE WAITING ROOM DURING YOUR PRE OP AND PROCEDURE.   **NO VISITORS ARE ALLOWED IN THE SHORT STAY AREA OR RECOVERY ROOM!!**   You are not required to quarantine at this time prior to your surgery. However, you must do this: Hand Hygiene often Do NOT share personal items Notify your provider if you are in close contact with someone who has COVID or you develop fever 100.4 or greater, new onset of sneezing, cough, sore throat, shortness of breath or body aches.       Your procedure is scheduled on:  Wednesday June 03, 2022  Report to [facility] Main Entrance.  Report to admitting at: 06:15    AM  +++++Call this number if you have any questions or problems the morning of surgery [phone]  Do not eat food :After Midnight the night prior to your surgery/procedure.  After Midnight you may have the following liquids until   05:30 AM DAY OF SURGERY  Clear Liquid Diet Water Black Coffee (sugar ok, NO MILK/CREAM OR CREAMERS)  Tea (sugar ok, NO MILK/CREAM OR CREAMERS) regular and decaf                             Plain Jell-O (NO RED)                                           Fruit ices (not with fruit pulp, NO RED)                                     Popsicles (NO RED)                                                                  Juice: apple, WHITE grape, WHITE cranberry Sports drinks like Gatorade (NO RED)                   The day of surgery:  Drink ONE (1) Pre-Surgery Clear Ensure  at    05:30    AM the morning of surgery. Drink in one sitting. Do not sip.  This drink was given to you during your hospital pre-op appointment visit. Nothing else to drink after completing the Pre-Surgery Clear Ensure     FOLLOW BOWEL PREP AND ANY ADDITIONAL PRE OP INSTRUCTIONS YOU RECEIVED FROM YOUR SURGEON'S OFFICE!!!   Oral Hygiene is also important to reduce your risk of  infection.        Remember - BRUSH YOUR TEETH THE MORNING OF SURGERY WITH YOUR REGULAR TOOTHPASTE  Do NOT smoke after Midnight the night before surgery.  Take ONLY these medicines the morning of surgery with A SIP OF WATER:   no medications                    You may not have any metal on your body including hair pins, jewelry, and body piercing  Do not  wear make-up, lotions, powders, perfumes, or deodorant  Do not wear nail polish including gel and S&S, artificial / acrylic nails, or any other type of covering on natural nails including finger and toenails. If you have artificial nails, gel coating, etc., that needs to be removed by a nail salon, Please have this removed prior to surgery. Not doing so may mean that your surgery could be cancelled or delayed if the Surgeon or anesthesia staff feels like they are unable to monitor you safely.   Do not shave 48 hours prior to surgery to avoid nicks in your skin which may contribute to postoperative infections.   Contacts, Hearing Aids, dentures or bridgework may not be worn into surgery.   DO NOT [place]. PHARMACY WILL DISPENSE MEDICATIONS LISTED ON YOUR MEDICATION LIST TO YOU DURING YOUR ADMISSION [place]!   Patients discharged on the day of surgery will not be allowed to drive home.  Someone NEEDS to stay with you for the first 24 hours after anesthesia.  Special Instructions: Bring a copy of your healthcare power of attorney and living will documents the day of surgery, if you wish to have them scanned into your  Medical Records- EPIC  Please read over the following fact sheets you were given: IF YOU HAVE QUESTIONS ABOUT YOUR PRE-OP INSTRUCTIONS, PLEASE CALL [phone]  ([place])   [place] - Preparing for Surgery Before surgery, you can play an important role.  Because skin is not sterile, your skin needs to be as free of germs as possible.  You can reduce the number of germs on  your skin by washing with CHG (chlorahexidine gluconate) soap before surgery.  CHG is an antiseptic cleaner which kills germs and bonds with the skin to continue killing germs even after washing. Please DO NOT use if you have an allergy to CHG or antibacterial soaps.  If your skin becomes reddened/irritated stop using the CHG and inform your nurse when you arrive at Short Stay. Do not shave (including legs and underarms) for at least 48 hours prior to the first CHG shower.  You may shave your face/neck.  Please follow these instructions carefully:  1.  Shower with CHG Soap the night before surgery and the  morning of surgery.  2.  If you choose to wash your hair, wash your hair first as usual with your normal  shampoo.  3.  After you shampoo, rinse your hair and body thoroughly to remove the shampoo.                             4.  Use CHG as you would any other liquid soap.  You can apply chg directly to the skin and wash.  Gently with a scrungie or clean washcloth.  5.  Apply the CHG Soap to your body ONLY FROM THE NECK DOWN.   Do not use on face/ open                           Wound or open sores. Avoid contact with eyes, ears mouth and genitals (private parts).                       Wash face,  Genitals (private parts) with your normal soap.             6.  Wash thoroughly, paying  special attention to the area where your  surgery  will be performed.  7.  Thoroughly rinse your body with warm water from the neck down.  8.  DO NOT shower/wash with your normal soap after using and rinsing off the CHG Soap.            9.  Pat yourself dry with a clean towel.            10.  Wear clean pajamas.            11.  Place clean sheets on your bed the night of your first shower and do not  sleep with pets.  ON THE DAY OF SURGERY : Do not apply any lotions/deodorants the morning of surgery.  Please wear clean clothes to the hospital/surgery center.    FAILURE TO FOLLOW THESE INSTRUCTIONS MAY RESULT IN  THE CANCELLATION OF YOUR SURGERY  PATIENT SIGNATURE_________________________________  NURSE SIGNATURE__________________________________  ________________________________________________________________________

## 2022-05-28 NOTE — Progress Notes (Addendum)
COVID Vaccine received:  '[x]'$  No '[]'$  Yes Date of any COVID positive Test in last 4 days:none  PCP - Patient says she has no PCP now and is taking no medications, she can not tell me why she is not taking any of her medications but says it's been several weeks.   She has not lived in the Assisted living center for the past 2-3 months. She states that she lives alone and pays her neighbor to help her and take her to doctor visits.   Patient states that she has had diarrhea for several days now and is very weak because of this.   Cardiologist - none  Chest x-ray -  EKG -   Stress Test -  ECHO -  Cardiac Cath -   Pacemaker/ICD device     '[x]'$  N/A Spinal Cord Stimulator:'[]'$  No '[]'$  Yes       Bowel Prep - None  History of Sleep Apnea? '[x]'$  No '[]'$  Yes   Sleep Study Date:   CPAP used?- '[x]'$  No '[]'$  Yes  (Instruct to bring their mask & Tubing)  Does the patient monitor blood sugar? '[]'$  No '[]'$  Yes  '[x]'$  N/A  Blood Thinner Instructions: none Aspirin Instructions: Last Dose:  ERAS Protocol Ordered: '[]'$  No  '[x]'$  Yes PRE-SURGERY '[x]'$  ENSURE  '[]'$  G2   Comments: Patient did not sign consent at PST appt. I had called Dr. Rosendo Gros' office regarding a discrepancy on the wording of the consent vs the order. Dr. Rosendo Gros has Femoral hernia both in his office note and in the surgery booking; however the order wording for the consent say Inguinal instead of femoral. This has not been changed so the patient will sign consent on the DOS. She refused to have CBC and BMP drawn at PST appt so we will draw on DOS.   Activity level: Patient can not climb a flight of stairs without difficulty; she is wheel bound, sometimes uses a walker.   Anesthesia review: hx CVA, Seizures, Patient states that she does not take any medications at this time. She is very confused and can't remember when the last time she took any of her medications.   Patient denies fever, cough and chest pain at PAT appointment.  She appears very cachetic  and frail to this nurse.   Patient verbalized understanding and agreement to the Pre-Surgical Instructions that were given to them at this PAT appointment. Patient was also educated of the need to review these PAT instructions again prior to his/her surgery.I reviewed the appropriate phone numbers to call if they have any and questions or concerns.

## 2022-05-29 ENCOUNTER — Encounter (HOSPITAL_COMMUNITY): Payer: Self-pay

## 2022-05-29 ENCOUNTER — Encounter (HOSPITAL_COMMUNITY)
Admission: RE | Admit: 2022-05-29 | Discharge: 2022-05-29 | Disposition: A | Discharge status: Home / Self Care | Payer: Medicare PPO | Source: Ambulatory Visit | Attending: General Surgery | Admitting: General Surgery

## 2022-05-29 ENCOUNTER — Other Ambulatory Visit: Payer: Self-pay

## 2022-05-29 VITALS — BP 111/72 | HR 77 | Temp 97.9°F | Resp 22 | Ht 59.0 in

## 2022-05-29 DIAGNOSIS — I10 Essential (primary) hypertension: Secondary | ICD-10-CM | POA: Diagnosis not present

## 2022-05-29 DIAGNOSIS — Z01812 Encounter for preprocedural laboratory examination: Secondary | ICD-10-CM | POA: Diagnosis not present

## 2022-05-29 HISTORY — DX: Cerebral infarction, unspecified: I63.9

## 2022-05-29 NOTE — Patient Instructions (Signed)
DUE TO SPACE LIMITATIONS, ONLY TWO VISITORS  (aged 76 and older) ARE ALLOWED TO COME WITH YOU AND STAY IN THE WAITING ROOM DURING YOUR PRE OP AND PROCEDURE.   **NO VISITORS ARE ALLOWED IN THE SHORT STAY AREA OR RECOVERY ROOM!!**   You are not required to quarantine at this time prior to your surgery. However, you must do this: Hand Hygiene often Do NOT share personal items Notify your provider if you are in close contact with someone who has COVID or you develop fever 100.4 or greater, new onset of sneezing, cough, sore throat, shortness of breath or body aches.       Your procedure is scheduled on:  Wednesday  June 03, 2022  Report to [facility] Main Entrance.  Report to admitting at: 06:15    AM  +++++Call this number if you have any questions or problems the morning of surgery [phone]  Do not eat food :After Midnight the night prior to your surgery/procedure.  After Midnight you may have the following liquids until  05:30 AM DAY OF SURGERY  Clear Liquid Diet Water Black Coffee (sugar ok, NO MILK/CREAM OR CREAMERS)  Tea (sugar ok, NO MILK/CREAM OR CREAMERS) regular and decaf                             Plain Jell-O (NO RED)                                           Fruit ices (not with fruit pulp, NO RED)                                     Popsicles (NO RED)                                                                  Juice: apple, WHITE grape, WHITE cranberry Sports drinks like Gatorade (NO RED)                   The day of surgery:  Drink ONE (1) Pre-Surgery Clear Ensure at   05:30  AM the morning of surgery. Drink in one sitting. Do not sip.  This drink was given to you during your hospital pre-op appointment visit. Nothing else to drink after completing the Pre-Surgery Clear Ensure    FOLLOW BOWEL PREP AND ANY ADDITIONAL PRE OP INSTRUCTIONS YOU RECEIVED FROM YOUR SURGEON'S OFFICE!!!   Oral Hygiene is also important to reduce your risk of  infection.        Remember - BRUSH YOUR TEETH THE MORNING OF SURGERY WITH YOUR REGULAR TOOTHPASTE   Take ONLY these medicines the morning of surgery with A SIP OF WATER:   no medications per patient                   You may not have any metal on your body including hair pins, jewelry, and body piercing  Do not wear make-up, lotions, powders, perfumes or deodorant  Do not wear nail polish  including gel and S&S, artificial / acrylic nails, or any other type of covering on natural nails including finger and toenails. If you have artificial nails, gel coating, etc., that needs to be removed by a nail salon, Please have this removed prior to surgery. Not doing so may mean that your surgery could be cancelled or delayed if the Surgeon or anesthesia staff feels like they are unable to monitor you safely.   Do not shave 48 hours prior to surgery to avoid nicks in your skin which may contribute to postoperative infections.   DO NOT [place]. PHARMACY WILL DISPENSE MEDICATIONS LISTED ON YOUR MEDICATION LIST TO YOU DURING YOUR ADMISSION [place]!   Patients discharged on the day of surgery will not be allowed to drive home.  Someone NEEDS to stay with you for the first 24 hours after anesthesia.  Please read over the following fact sheets you were given: IF YOU HAVE QUESTIONS ABOUT YOUR PRE-OP INSTRUCTIONS, PLEASE CALL [phone]  ([place])   [facility] - Preparing for Surgery Before surgery, you can play an important role.  Because skin is not sterile, your skin needs to be as free of germs as possible.  You can reduce the number of germs on your skin by washing with CHG (chlorahexidine gluconate) soap before surgery.  CHG is an antiseptic cleaner which kills germs and bonds with the skin to continue killing germs even after washing. Please DO NOT use if you have an allergy to CHG or antibacterial soaps.  If your skin becomes reddened/irritated stop using the  CHG and inform your nurse when you arrive at Short Stay. Do not shave (including legs and underarms) for at least 48 hours prior to the first CHG shower.  You may shave your face/neck.  Please follow these instructions carefully:  1.  Shower with CHG Soap the night before surgery and the  morning of surgery.  2.  If you choose to wash your hair, wash your hair first as usual with your normal  shampoo.  3.  After you shampoo, rinse your hair and body thoroughly to remove the shampoo.                             4.  Use CHG as you would any other liquid soap.  You can apply chg directly to the skin and wash.  Gently with a scrungie or clean washcloth.  5.  Apply the CHG Soap to your body ONLY FROM THE NECK DOWN.   Do not use on face/ open                           Wound or open sores. Avoid contact with eyes, ears mouth and genitals (private parts).                       Wash face,  Genitals (private parts) with your normal soap.             6.  Wash thoroughly, paying special attention to the area where your  surgery  will be performed.  7.  Thoroughly rinse your body with warm water from the neck down.  8.  DO NOT shower/wash with your normal soap after using and rinsing off the CHG Soap.            9.  Pat yourself  dry with a clean towel.            10.  Wear clean pajamas.            11.  Place clean sheets on your bed the night of your first shower and do not  sleep with pets.  ON THE DAY OF SURGERY : Do not apply any lotions/deodorants the morning of surgery.  Please wear clean clothes to the hospital/surgery center.    FAILURE TO FOLLOW THESE INSTRUCTIONS MAY RESULT IN THE CANCELLATION OF YOUR SURGERY  PATIENT SIGNATURE_________________________________  NURSE SIGNATURE__________________________________  ________________________________________________________________________

## 2022-05-29 NOTE — Progress Notes (Signed)
I called Dr. Rosendo Gros' office to let them know about Elaine Rivera's condition at today's PST appt. They are going to let the Doctor know about this.

## 2022-06-02 ENCOUNTER — Ambulatory Visit (HOSPITAL_COMMUNITY): Payer: Medicare PPO | Admitting: Certified Registered"

## 2022-06-02 ENCOUNTER — Ambulatory Visit (HOSPITAL_COMMUNITY): Payer: Medicare PPO | Admitting: Physician Assistant

## 2022-06-02 NOTE — H&P (Signed)
Chief Complaint: New Patient (Rt inguinal hernia )       History of Present Illness: Elaine Rivera is a 76 y.o. female who is seen today as an office consultation at the request of Dr. Royston Sinner for evaluation of New Patient (Rt inguinal hernia ) .   Patient is a 76 year old female, fairly healthy who comes in secondary to a right-sided hernia. Patient states that she was recently diagnosed with a hernia on her visit to her assisted living center.  She states that she initially thought she had scar tissue from the previous tubal pregnancy.  She has had previous hysterectomy as well. Patient was seen by PA there and was found to have a hernia.  She was also referred to GYN and then referred here for further evaluation.   Patient states that she does have some pain.  She states that she does have to take Tylenol for the pain.  She states this is fairly random and sporadic.  Patient has had no signs or symptoms of incarceration or strangulation.     Review of Systems: A complete review of systems was obtained from the patient.  I have reviewed this information and discussed as appropriate with the patient.  See HPI as well for other ROS.   Review of Systems  Constitutional:  Negative for fever.  HENT:  Negative for congestion.   Eyes:  Negative for blurred vision.  Respiratory:  Negative for cough, shortness of breath and wheezing.   Cardiovascular:  Negative for chest pain and palpitations.  Gastrointestinal:  Negative for heartburn.  Genitourinary:  Negative for dysuria.  Musculoskeletal:  Negative for myalgias.  Skin:  Negative for rash.  Neurological:  Negative for dizziness and headaches.  Psychiatric/Behavioral:  Negative for depression and suicidal ideas.   All other systems reviewed and are negative.      Medical History: Past Medical History Past Medical History: Diagnosis        Date            Aneurysm (CMS-HCC)                        Anxiety                          Depression                   GERD (gastroesophageal reflux disease)                   History of stroke                       Kidney stones              PONV (postoperative nausea and vomiting)               Seizures (CMS-HCC)              Thyroid disease                  Patient Active Problem List Diagnosis            Intermittent monocular esotropia of right eye            Diplopia            Brain aneurysm s/p surgery 1998            Divergence insufficiency  Hyperparathyroidism (CMS-HCC)            S/P left lateral rectus resection of 268m, left medial rectus recession 562m(Dr. FrJalene Mullet2/05/2017)     Past Surgical History Past Surgical History: Procedure       [place]ality         [place]    B[place]           [facility] CAROTID CIRCULATION (INTRACRANIAL APPROACH)  Right            1998            VOCAL CORD MEDIALIZATION       Right    04/2007            LITHOTRIPSY                        2013            EGD                03/2016            STRABISMUS SURGERY 2 HORIZONTAL MUSCLES       Left      11/06/2016             Procedure: left medial rectus recession 5.68m64mleft lateral rectus resection 5.68mm51mSurgeon: SharJanene Harvey;  [place] [facility] [place]  Laterality: Left;            COLONOSCOPY                                 HYSTERECTOMY                               ORIF WRIST FRACTURE      Left      ~2000            PARATHYROIDECTOMY                  ~2002       Allergies Allergies Allergen           Reactions            Sulfur (Bulk)    Other (See Comments)                         Pt. Took as as child. Patient stated, "I was told as a child never to take sulfa drugs again. I don't know what happens if it was given to me."       Current Outpatient  Medications on File Prior to Visit Medication       Sig       Dispense         Refill  busPIRone (BUSPAR) 7.5 MG tablet Take 7.5 mg by mouth 2 (two) times daily                              cyanocobalamin (VITAMIN B12) 1000 MCG tablet   Take by mouth                                     potassium chloride (KLOR-CON) 10 MEQ ER tablet                                      acyclovir (ZOVIRAX) 400 MG tablet   Take 400 mg by mouth 3 (three) times daily as needed.                                       ALPRAZolam (XANAX) 0.5 MG tablet           Take 1 tablet by mouth once daily as needed.                          2            ARIPiprazole (ABILIFY) 5 MG tablet  Take 5 mg by mouth nightly.                          0            DULoxetine (CYMBALTA) 60 MG DR capsule          Take 120 mg by mouth nightly.                                     erythromycin [facility]) ophthalmic ointment         Place into the left eye every evening. If using drops apply ointment 5 minutes after last drop. Start after surgery          1 g         2            QUEtiapine (SEROQUEL) 25 MG tablet        Take 1 tablet by mouth nightly.                          5   No current facility-administered medications on file prior to visit.     Family History Family History Problem           Relation           Age of Onset            Breast cancer  Mother              High blood pressure (Hypertension)   Father               Hyperlipidemia (Elevated cholesterol)            Father               Coronary Artery Disease (Blocked arteries around heart)     Father  Diabetes          Father               High blood pressure (Hypertension)   Brother                         Hyperlipidemia (Elevated cholesterol)            Brother                         Diabetes          Brother                         Coronary Artery Disease (Blocked arteries around heart)     Brother                         Anesthesia problems  Neg  Hx                    Social History   Tobacco Use Smoking Status           Never Smokeless Tobacco   Never     Social History Social History     Socioeconomic History            Marital status:  Divorced Tobacco Use            Smoking status:          Never            Smokeless tobacco:    Never Substance and Sexual Activity            Alcohol use:    Never                         Comment: ~5 drinks/year            Drug use:        Never       Objective:     Vitals:             03/25/22 1340 BP:      96/60 Pulse:  100 Temp:  36.8 C (98.3 F) SpO2:  98% Weight:            (!) 41.4 kg (91 lb 3.2 oz) Height: 149.9 cm ('4\' 11"'$ )   Body mass index is 18.42 kg/m. Physical Exam Constitutional:      Appearance: Normal appearance.  HENT:     Head: Normocephalic and atraumatic.     Mouth/Throat:     Mouth: Mucous membranes are moist.     Pharynx: Oropharynx is clear.  Eyes:     General: No scleral icterus.    Pupils: Pupils are equal, round, and reactive to light.  Cardiovascular:     Rate and Rhythm: Normal rate and regular rhythm.     Pulses: Normal pulses.     Heart sounds: No murmur heard.   No friction rub. No gallop.  Pulmonary:     Effort: Pulmonary effort is normal. No respiratory distress.     Breath sounds: Normal breath sounds. No stridor.  Abdominal:     General: Abdomen is flat.     Hernia: A hernia is present. Hernia is present in the right inguinal area.  Musculoskeletal:        General: No swelling.  Skin:  General: Skin is warm.  Neurological:     General: No focal deficit present.     Mental Status: She is alert and oriented to person, place, and time. Mental status is at baseline.  Psychiatric:        Mood and Affect: Mood normal.        Thought Content: Thought content normal.        Judgment: Judgment normal.        Assessment and Plan: Diagnoses and all orders for this visit:   Non-recurrent unilateral femoral hernia  without obstruction or gangrene     Elaine Rivera is a 76 y.o. female    1.          We will proceed to the OR for a laparoscopic right femoral hernia repair with mesh. 2.         All risks and benefits were discussed with the patient, to generally include infection, bleeding, damage to surrounding structures, acute and chronic nerve pain, and recurrence. Alternatives were offered and described.  All questions were answered and the patient voiced understanding of the procedure and wishes to proceed at this point.             No follow-ups on file.   Ralene Ok, MD, [facility], [facility]

## 2022-06-02 NOTE — Anesthesia Preprocedure Evaluation (Signed)
Anesthesia Evaluation  Patient identified by MRN, date of birth, ID band Patient awake    Reviewed: Allergy & Precautions, H&P , NPO status , Patient's Chart, lab work & pertinent test results  History of Anesthesia Complications (+) PONV and history of anesthetic complications  Airway Mallampati: II       Dental  (+) Poor Dentition   Pulmonary former smoker,    Pulmonary exam normal        Cardiovascular hypertension, Normal cardiovascular exam     Neuro/Psych  Headaches, Seizures -, Well Controlled,  PSYCHIATRIC DISORDERS Anxiety Depression CVA    GI/Hepatic Neg liver ROS, GERD  Medicated,  Endo/Other  negative endocrine ROS  Renal/GU negative Renal ROS  negative genitourinary   Musculoskeletal  (+) Arthritis , Osteoarthritis,    Abdominal Normal abdominal exam  (+)   Peds  Hematology   Anesthesia Other Findings IMPRESSIONS    1. Left ventricular ejection fraction, by estimation, is 60 to 65%. The  left ventricle has normal function. The left ventricle has no regional  wall motion abnormalities. There is mild left ventricular hypertrophy.  Left ventricular diastolic parameters  are consistent with Grade I diastolic dysfunction (impaired relaxation).  2. Right ventricular systolic function is normal. The right ventricular  size is normal.  3. The mitral valve is normal in structure. Mild mitral valve  regurgitation. No evidence of mitral stenosis.  4. The aortic valve is normal in structure. Aortic valve regurgitation is  not visualized. No aortic stenosis is present.  5. The inferior vena cava is normal in size with greater than 50%  respiratory variability, suggesting right atrial pressure of 3 mmHg.   Comparison(s): No significant change from prior study. Prior images  reviewed side by side.   Conclusion(s)/Recommendation(s): No intracardiac source of embolism  detected on this transthoracic  study. A transesophageal echocardiogram is  recommended to exclude cardiac source of embolism if clinically indicated.   FINDINGS  Left Ventricle: Left ventricular ejection fraction, by estimation, is 60  to 65%. The left ventricle has normal function. The left ventricle has no  regional wall motion abnormalities. The left ventricular internal cavity  size was normal in size. There is  mild left ventricular hypertrophy. Left ventricular diastolic parameters  are consistent with Grade I diastolic dysfunction (impaired relaxation).   Right Ventricle: The right ventricular size is normal. No increase in  right ventricular wall thickness. Right ventricular systolic function is  normal.   Left Atrium: Left atrial size was normal in size.   Right Atrium: Right atrial size was normal in size.   Pericardium: There is no evidence of pericardial effusion.   Mitral Valve: The mitral valve is normal in structure. Mild mitral valve  regurgitation. No evidence of mitral valve stenosis. MV peak gradient, 9.2  mmHg. The mean mitral valve gradient is 3.0 mmHg.   Tricuspid Valve: The tricuspid valve is normal in structure. Tricuspid  valve regurgitation is not demonstrated. No evidence of tricuspid  stenosis.   Aortic Valve: The aortic valve is normal in structure. Aortic valve  regurgitation is not visualized. No aortic stenosis is present. Aortic  valve mean gradient measures 4.0 mmHg. Aortic valve peak gradient measures  7.1 mmHg. Aortic valve area, by VTI  measures 2.41 cm.   Pulmonic Valve: The pulmonic valve was normal in structure. Pulmonic valve  regurgitation is not visualized. No evidence of pulmonic stenosis.   Aorta: The aortic root is normal in size and structure.   Venous: The inferior  vena cava is normal in size with greater than 50%  respiratory variability, suggesting right atrial pressure of 3 mmHg.   IAS/Shunts: No atrial level shunt detected by color flow Doppler.      LEFT VENTRICLE  PLAX 2D  LVIDd:     3.60 cm Diastology  LVIDs:     2.50 cm LV e' medial:  5.98 cm/s  LV PW:     1.10 cm LV E/e' medial: 16.1  LV IVS:    1.30 cm LV e' lateral:  9.46 cm/s  LVOT diam:   1.90 cm LV E/e' lateral: 10.1  LV SV:     67  LV SV Index:  43  LVOT Area:   2.84 cm     RIGHT VENTRICLE  RV Basal diam: 2.70 cm  RV S prime:   14.40 cm/s  TAPSE (M-mode): 2.2 cm   LEFT ATRIUM       Index    RIGHT ATRIUM     Index  LA diam:    3.30 cm 2.15 cm/m RA Area:   9.18 cm  LA Vol (A2C):  36.0 ml 23.44 ml/m RA Volume:  16.20 ml 10.55 ml/m  LA Vol (A4C):  38.3 ml 24.94 ml/m  LA Biplane Vol: 37.3 ml 24.29 ml/m  AORTIC VALVE  AV Area (Vmax):  2.26 cm  AV Area (Vmean):  2.19 cm  AV Area (VTI):   2.41 cm  AV Vmax:      133.00 cm/s  AV Vmean:     88.400 cm/s  AV VTI:      0.276 m  AV Peak Grad:   7.1 mmHg  AV Mean Grad:   4.0 mmHg  LVOT Vmax:     106.00 cm/s  LVOT Vmean:    68.400 cm/s  LVOT VTI:     0.235 m  LVOT/AV VTI ratio: 0.85    AORTA  Ao Root diam: 2.80 cm  Ao Asc diam: 3.80 cm   MITRAL VALVE        TRICUSPID VALVE  MV Area (PHT): 3.48 cm   TR Peak grad:  26.8 mmHg  MV Area VTI:  1.77 cm   TR Vmax:    259.00 cm/s  MV Peak grad: 9.2 mmHg  MV Mean grad: 3.0 mmHg   SHUNTS  MV Vmax:    1.52 m/s   Systemic VTI: 0.24 m  MV Vmean:   75.3 cm/s  Systemic Diam: 1.90 cm  MV Decel Time: 218 msec  MV E velocity: 96.00 cm/s  MV A velocity: 138.00 cm/s  MV E/A ratio: 0.70   Candee Furbish MD  Electronically signed by Candee Furbish MD  Signature Date/Time: 03/05/2021/2:22:34 PM      Final   Imaging Info  ECHOCARDIOGRAM COMPLETE (Order #[id number]) on 03/05/21  Study History  ECHOCARDIOGRAM COMPLETE (Order #[id number]) on 03/05/21  [employer] Images  Show images for ECHOCARDIOGRAM COMPLETE Images on Long Term  Storage  Show images for Franki Monte M Performing Technologist/Nurse  Performing Technologist/Nurse: Cammy Brochure, RT Reason for Exam Priority: Anticipated Discharge Not on file  Patient Data  Height  59 in   BP  111/62 mmHg      Encounter Imaging Findings  CT Angio Head W or Wo Contrast Order #: [id number] Accession #: [id number]  Finding Acuity Linked Recommendation Recommendation Status Finding Status Incidental Incidental Follow-up with PCP Closed   [place] Physician Jeannine Boga, MD [phone]    CT Angio Neck W and/or Wo Contrast Order #: [id number] Accession #:  [id number]  Finding Acuity Linked Recommendation Recommendation Status Finding Status Incidental Incidental Follow-up with PCP Closed   [place] Physician Jeannine Boga, MD [phone]    [place] Order #: [id number] Accession #: [id number]  Finding Acuity Linked Recommendation Recommendation Status Finding Status Incidental Incidental Follow-up with PCP Closed   [place] Physician Lavonia Dana, MD [phone]    Surgical History  Surgical History   No past medical history on file.  Other Surgical History   Procedure Laterality Date Comment Source CEREBRAL ANEURYSM REPAIR   1998  COLONOSCOPY     ECTOPIC PREGNANCY SURGERY   x2   ENTEROSCOPY N/A 03/01/2020 Procedure: ENTEROSCOPY; Surgeon: Carol Ada, MD; Location: [facility]; Service: Endoscopy; Laterality: N/A;  ENTEROSCOPY N/A 11/14/2021 Procedure: ENTEROSCOPY; Surgeon: Carol Ada, MD; Location: [facility]; Service: Endoscopy; Laterality: N/A;  ESOPHAGOGASTRODUODENOSCOPY N/A 04/22/2016 Procedure: ESOPHAGOGASTRODUODENOSCOPY (EGD); Surgeon: Juanita Craver, MD; Location: [facility]; Service: Endoscopy; Laterality: N/A;  ESOPHAGOGASTRODUODENOSCOPY (EGD) WITH PROPOFOL N/A 11/12/2021 Procedure: ESOPHAGOGASTRODUODENOSCOPY (EGD) WITH PROPOFOL; Surgeon: Carol Ada, MD;  Location: [facility]; Service: Endoscopy; Laterality: N/A;  GIVENS CAPSULE STUDY N/A 11/12/2021 Procedure: GIVENS CAPSULE STUDY; Surgeon: Carol Ada, MD; Location: [facility]; Service: Endoscopy; Laterality: N/A;  IR 3D INDEPENDENT WKST  09/05/2018   IR ANGIO INTRA EXTRACRAN SEL COM CAROTID INNOMINATE BILAT MOD SED  09/05/2018   IR ANGIO VERTEBRAL SEL VERTEBRAL UNI R MOD SED  09/05/2018   IR ANGIOGRAM FOLLOW UP STUDY  09/05/2018   IR RADIOLOGIST EVAL & MGMT  03/28/2021   IR TRANSCATH/EMBOLIZ  09/05/2018   PARATHYROIDECTOMY   2002  RADIOLOGY WITH ANESTHESIA N/A 09/05/2018 Procedure: EMBOLIZATION; Surgeon: Radiologist, Medication, MD; Location: [place]; Service: Radiology; Laterality: N/A;  TONSILLECTOMY   1953  VAGINAL HYSTERECTOMY   2001  VOCAL CORD LATERALIZATION, ENDOSCOPIC APPROACH W/ MLB   2007 ('@[facility]'$ )  WRIST SURGERY   left side/2008  WRIST SURGERY   right side/ 2002    Implants  Permanent Stent Pipeline Flex 4.50x35 - [id number] - Implanted (Right) Vertebral Artery  Inventory item: PIPELINE FLEX 4.50X35 Model/Cat number: [id number] Manufacturer: [place] Lot number: [id number] Area Of Implantation: Vertebral Artery   As of 09/05/2018  Status: Implanted    Pipeline Flex 4.50x20 - [id number] - Implanted (Right) Vertebral Artery  Inventory item: PIPELINE FLEX 4.50X20 Model/Cat number: [id number] Manufacturer: [employer] Lot number: [id number] Area Of Implantation: Vertebral Artery   As of 09/05/2018  Status: Implanted     IMPRESSIONS    1. Left ventricular ejection fraction, by estimation, is 60 to 65%. The  left ventricle has normal function. The left ventricle has no regional  wall motion abnormalities. There is mild left ventricular hypertrophy.  Left ventricular diastolic parameters  are consistent with Grade I diastolic dysfunction (impaired relaxation).  2. Right ventricular systolic function is normal. The right ventricular  size is  normal.  3. The mitral valve is normal in structure. Mild mitral valve  regurgitation. No evidence of mitral stenosis.  4. The aortic valve is normal in structure. Aortic valve regurgitation is  not visualized. No aortic stenosis is present.  5. The inferior vena cava is normal in size with greater than 50%  respiratory variability, suggesting right atrial pressure of 3 mmHg.   Comparison(s): No significant change from prior study. Prior images  reviewed side by side.   Conclusion(s)/Recommendation(s): No intracardiac source of embolism  detected on this transthoracic study. A transesophageal echocardiogram is  recommended to exclude cardiac source of embolism if clinically indicated.   FINDINGS  Left Ventricle: Left ventricular ejection fraction, by estimation, is 60  to 65%. The left ventricle has normal function. The left ventricle has no  regional wall motion abnormalities. The left ventricular internal cavity  size was normal in size. There is  mild left ventricular hypertrophy. Left ventricular diastolic parameters  are consistent with Grade I diastolic dysfunction (impaired relaxation).   Right Ventricle: The right ventricular size is normal. No increase in  right ventricular wall thickness. Right ventricular systolic function is  normal.   Left Atrium: Left atrial size was normal in size.   Right Atrium: Right atrial size was normal in size.   Pericardium: There is no evidence of pericardial effusion.   Mitral Valve: The mitral valve is normal in structure. Mild mitral valve  regurgitation. No evidence of mitral valve stenosis. MV peak gradient, 9.2  mmHg. The mean mitral valve gradient is 3.0 mmHg.   Tricuspid Valve: The tricuspid valve is normal in structure. Tricuspid  valve regurgitation is not demonstrated. No evidence of tricuspid  stenosis.   Aortic Valve: The aortic valve is normal in structure. Aortic valve  regurgitation is not visualized. No aortic  stenosis is present. Aortic  valve mean gradient measures 4.0 mmHg. Aortic valve peak gradient measures  7.1 mmHg. Aortic valve area, by VTI  measures 2.41 cm.   Pulmonic Valve: The pulmonic valve was normal in structure. Pulmonic valve  regurgitation is not visualized. No evidence of pulmonic stenosis.   Aorta: The aortic root is normal in size and structure.   Venous: The inferior vena cava is normal in size with greater than 50%  respiratory variability, suggesting right atrial pressure of 3 mmHg.   IAS/Shunts: No atrial level shunt detected by color flow Doppler.     LEFT VENTRICLE  PLAX 2D  LVIDd:     3.60 cm Diastology  LVIDs:     2.50 cm LV e' medial:  5.98 cm/s  LV PW:     1.10 cm LV E/e' medial: 16.1  LV IVS:    1.30 cm LV e' lateral:  9.46 cm/s  LVOT diam:   1.90 cm LV E/e' lateral: 10.1  LV SV:     67  LV SV Index:  43  LVOT Area:   2.84 cm     RIGHT VENTRICLE  RV Basal diam: 2.70 cm  RV S prime:   14.40 cm/s  TAPSE (M-mode): 2.2 cm   LEFT ATRIUM       Index    RIGHT ATRIUM     Index  LA diam:    3.30 cm 2.15 cm/m RA Area:   9.18 cm  LA Vol (A2C):  36.0 ml 23.44 ml/m RA Volume:  16.20 ml 10.55 ml/m  LA Vol (A4C):  38.3 ml 24.94 ml/m  LA Biplane Vol: 37.3 ml 24.29 ml/m  AORTIC VALVE  AV Area (Vmax):  2.26 cm  AV Area (Vmean):  2.19 cm  AV Area (VTI):   2.41 cm  AV Vmax:      133.00 cm/s  AV Vmean:     88.400 cm/s  AV VTI:      0.276 m  AV Peak Grad:   7.1 mmHg  AV Mean Grad:   4.0 mmHg  LVOT Vmax:     106.00 cm/s  LVOT Vmean:    68.400 cm/s  LVOT VTI:     0.235 m  LVOT/AV VTI ratio: 0.85    AORTA  Ao Root diam: 2.80 cm  Ao Asc diam:  3.80 cm   MITRAL VALVE        TRICUSPID VALVE  MV Area (PHT): 3.48 cm   TR Peak grad:  26.8 mmHg  MV Area VTI:  1.77 cm   TR Vmax:    259.00 cm/s  MV Peak grad: 9.2 mmHg  MV Mean  grad: 3.0 mmHg   SHUNTS  MV Vmax:    1.52 m/s   Systemic VTI: 0.24 m  MV Vmean:   75.3 cm/s  Systemic Diam: 1.90 cm  MV Decel Time: 218 msec  MV E velocity: 96.00 cm/s  MV A velocity: 138.00 cm/s  MV E/A ratio: 0.70   Candee Furbish MD  Electronically signed by Candee Furbish MD  Signature Date/Time: 03/05/2021/2:22:34 PM      Final   Imaging Info  ECHOCARDIOGRAM COMPLETE (Order #[id number]) on 03/05/21  Study History  ECHOCARDIOGRAM COMPLETE (Order #[id number]) on 03/05/21  [employer] Images  Show images for ECHOCARDIOGRAM COMPLETE Images on [employer] Term [employer]  Show images for Franki Monte M Performing Technologist/Nurse  Performing Technologist/Nurse: Cammy Brochure, RT Reason for Exam Priority: Anticipated Discharge Not on file  Patient Data  Height  59 in   BP  111/62 mmHg      Encounter Imaging Findings  CT Angio Head W or Wo Contrast Order #: [id number] Accession #: [id number]  Finding Acuity Linked Recommendation Recommendation Status Finding Status Incidental Incidental Follow-up with PCP Closed   [place] Physician Jeannine Boga, MD [phone]    CT Angio Neck W and/or Wo Contrast Order #: [id number] Accession #: [id number]  Finding Acuity Linked Recommendation Recommendation Status Finding Status Incidental Incidental Follow-up with PCP Closed   [place] Physician Jeannine Boga, MD [phone]    [place] Order #: [id number] Accession #: [id number]  Finding Acuity Linked Recommendation Recommendation Status Finding Status Incidental Incidental Follow-up with PCP Closed   [place] Physician Lavonia Dana, MD [phone]    Surgical History  Surgical History   No past medical history on file.  Other Surgical History   Procedure Laterality Date Comment Source CEREBRAL ANEURYSM REPAIR   1998  COLONOSCOPY     ECTOPIC PREGNANCY SURGERY   x2    ENTEROSCOPY N/A 03/01/2020 Procedure: ENTEROSCOPY; Surgeon: Carol Ada, MD; Location: [facility]; Service: Endoscopy; Laterality: N/A;  ENTEROSCOPY N/A 11/14/2021 Procedure: ENTEROSCOPY; Surgeon: Carol Ada, MD; Location: [facility]; Service: Endoscopy; Laterality: N/A;  ESOPHAGOGASTRODUODENOSCOPY N/A 04/22/2016 Procedure: ESOPHAGOGASTRODUODENOSCOPY (EGD); Surgeon: Juanita Craver, MD; Location: [facility]; Service: Endoscopy; Laterality: N/A;  ESOPHAGOGASTRODUODENOSCOPY (EGD) WITH PROPOFOL N/A 11/12/2021 Procedure: ESOPHAGOGASTRODUODENOSCOPY (EGD) WITH PROPOFOL; Surgeon: Carol Ada, MD; Location: [facility]; Service: Endoscopy; Laterality: N/A;  GIVENS CAPSULE STUDY N/A 11/12/2021 Procedure: GIVENS CAPSULE STUDY; Surgeon: Carol Ada, MD; Location: [facility]; Service: Endoscopy; Laterality: N/A;  IR 3D INDEPENDENT WKST  09/05/2018   IR ANGIO INTRA EXTRACRAN SEL COM CAROTID INNOMINATE BILAT MOD SED  09/05/2018   IR ANGIO VERTEBRAL SEL VERTEBRAL UNI R MOD SED  09/05/2018   IR ANGIOGRAM FOLLOW UP STUDY  09/05/2018   IR RADIOLOGIST EVAL & MGMT  03/28/2021   IR TRANSCATH/EMBOLIZ  09/05/2018   PARATHYROIDECTOMY   2002  RADIOLOGY WITH ANESTHESIA N/A 09/05/2018 Procedure: EMBOLIZATION; Surgeon: Radiologist, Medication, MD; Location: [place]; Service: Radiology; Laterality: N/A;  TONSILLECTOMY   1953  VAGINAL HYSTERECTOMY   2001  VOCAL CORD LATERALIZATION, ENDOSCOPIC APPROACH W/ MLB   2007 ('@[facility]'$ )  WRIST SURGERY   left side/2008  WRIST SURGERY   right side/ 2002    Implants  Permanent Stent  Pipeline Flex 4.50x35 - [id number] - Implanted (Right) Vertebral Artery  Inventory item: PIPELINE FLEX 4.50X35 Model/Cat number: [id number] Manufacturer: [place] Lot number: [id number] Area Of Implantation: Vertebral Artery   As of 09/05/2018  Status: Implanted    Pipeline Flex 4.50x20 - [id number] - Implanted (Right) Vertebral Artery  Inventory item: PIPELINE  FLEX 4.50X20 Model/Cat number: [id number] Manufacturer: [place] Lot number: [id number] Area Of Implantation: Vertebral Artery   As of 09/05/2018  Status: Implanted       Reproductive/Obstetrics negative OB ROS                            Anesthesia Physical  Anesthesia Plan  ASA: 3  Anesthesia Plan: General   Post-op Pain Management: Ofirmev IV (intra-op)*   Induction: Intravenous  PONV Risk Score and Plan: 4 or greater and Ondansetron and Dexamethasone  Airway Management Planned: Oral ETT and LMA  Additional Equipment: None  Intra-op Plan:   Post-operative Plan: Extubation in OR  Informed Consent: I have reviewed the patients History and Physical, chart, labs and discussed the procedure including the risks, benefits and alternatives for the proposed anesthesia with the patient or authorized representative who has indicated his/her understanding and acceptance.     Dental advisory given  Plan Discussed with: CRNA  Anesthesia Plan Comments:        Anesthesia Quick Evaluation

## 2022-06-02 NOTE — H&P (View-Only) (Signed)
Chief Complaint: New Patient (Rt inguinal hernia )       History of Present Illness: Elaine Rivera is a 76 y.o. female who is seen today as an office consultation at the request of Dr. Royston Sinner for evaluation of New Patient (Rt inguinal hernia ) .   Patient is a 76 year old female, fairly healthy who comes in secondary to a right-sided hernia. Patient states that she was recently diagnosed with a hernia on her visit to her assisted living center.  She states that she initially thought she had scar tissue from the previous tubal pregnancy.  She has had previous hysterectomy as well. Patient was seen by PA there and was found to have a hernia.  She was also referred to GYN and then referred here for further evaluation.   Patient states that she does have some pain.  She states that she does have to take Tylenol for the pain.  She states this is fairly random and sporadic.  Patient has had no signs or symptoms of incarceration or strangulation.     Review of Systems: A complete review of systems was obtained from the patient.  I have reviewed this information and discussed as appropriate with the patient.  See HPI as well for other ROS.   Review of Systems  Constitutional:  Negative for fever.  HENT:  Negative for congestion.   Eyes:  Negative for blurred vision.  Respiratory:  Negative for cough, shortness of breath and wheezing.   Cardiovascular:  Negative for chest pain and palpitations.  Gastrointestinal:  Negative for heartburn.  Genitourinary:  Negative for dysuria.  Musculoskeletal:  Negative for myalgias.  Skin:  Negative for rash.  Neurological:  Negative for dizziness and headaches.  Psychiatric/Behavioral:  Negative for depression and suicidal ideas.   All other systems reviewed and are negative.      Medical History: Past Medical History Past Medical History: Diagnosis        Date            Aneurysm (CMS-HCC)                        Anxiety                          Depression                   GERD (gastroesophageal reflux disease)                   History of stroke                       Kidney stones              PONV (postoperative nausea and vomiting)               Seizures (CMS-HCC)              Thyroid disease                  Patient Active Problem List Diagnosis            Intermittent monocular esotropia of right eye            Diplopia            Brain aneurysm s/p surgery 1998            Divergence insufficiency  Hyperparathyroidism (CMS-HCC)            S/P left lateral rectus resection of 264m, left medial rectus recession 523m(Dr. FrJalene Mullet2/05/2017)     Past Surgical History Past Surgical History: Procedure       Laterality         Date            [place]    Bilateral           [place] (INTRACRANIAL APPROACH)  Right            1998            VOCAL CORD MEDIALIZATION       Right    04/2007            LITHOTRIPSY                        2013            EGD                03/2016            STRABISMUS SURGERY 2 HORIZONTAL MUSCLES       Left      11/06/2016             Procedure: left medial rectus recession 5.64m264mleft lateral rectus resection 5.64mm64mSurgeon: SharJanene Harvey;  [facility] [place];  Laterality: Left;            COLONOSCOPY                                 HYSTERECTOMY                               ORIF WRIST FRACTURE      Left      ~2000            PARATHYROIDECTOMY                  ~2002       Allergies Allergies Allergen           Reactions            Sulfur (Bulk)    Other (See Comments)                         Pt. Took as as child. Patient stated, "I was told as a child never to take sulfa drugs again. I don't know what happens if it was given to me."       Current Outpatient  Medications on File Prior to Visit Medication       Sig       Dispense         Refill  busPIRone (BUSPAR) 7.5 MG tablet Take 7.5 mg by mouth 2 (two) times daily                              cyanocobalamin (VITAMIN B12) 1000 MCG tablet   Take by mouth                                     potassium chloride (KLOR-CON) 10 MEQ ER tablet                                      acyclovir (ZOVIRAX) 400 MG tablet   Take 400 mg by mouth 3 (three) times daily as needed.                                       ALPRAZolam (XANAX) 0.5 MG tablet           Take 1 tablet by mouth once daily as needed.                          2            ARIPiprazole (ABILIFY) 5 MG tablet  Take 5 mg by mouth nightly.                          0            DULoxetine (CYMBALTA) 60 MG DR capsule          Take 120 mg by mouth nightly.                                     erythromycin [facility]) ophthalmic ointment         Place into the left eye every evening. If using drops apply ointment 5 minutes after last drop. Start after surgery          1 g         2            QUEtiapine (SEROQUEL) 25 MG tablet        Take 1 tablet by mouth nightly.                          5   No current facility-administered medications on file prior to visit.     Family History Family History Problem           Relation           Age of Onset            Breast cancer  Mother              High blood pressure (Hypertension)   Father               Hyperlipidemia (Elevated cholesterol)            Father               Coronary Artery Disease (Blocked arteries around heart)     Father  Diabetes          Father               High blood pressure (Hypertension)   Brother                         Hyperlipidemia (Elevated cholesterol)            Brother                         Diabetes          Brother                         Coronary Artery Disease (Blocked arteries around heart)     Brother                         Anesthesia problems  Neg  Hx                    Social History   Tobacco Use Smoking Status           Never Smokeless Tobacco   Never     Social History Social History     Socioeconomic History            Marital status:  Divorced Tobacco Use            Smoking status:          Never            Smokeless tobacco:    Never Substance and Sexual Activity            Alcohol use:    Never                         Comment: ~5 drinks/year            Drug use:        Never       Objective:     Vitals:             03/25/22 1340 BP:      96/60 Pulse:  100 Temp:  36.8 C (98.3 F) SpO2:  98% Weight:            (!) 41.4 kg (91 lb 3.2 oz) Height: 149.9 cm ('4\' 11"'$ )   Body mass index is 18.42 kg/m. Physical Exam Constitutional:      Appearance: Normal appearance.  HENT:     Head: Normocephalic and atraumatic.     Mouth/Throat:     Mouth: Mucous membranes are moist.     Pharynx: Oropharynx is clear.  Eyes:     General: No scleral icterus.    Pupils: Pupils are equal, round, and reactive to light.  Cardiovascular:     Rate and Rhythm: Normal rate and regular rhythm.     Pulses: Normal pulses.     Heart sounds: No murmur heard.   No friction rub. No gallop.  Pulmonary:     Effort: Pulmonary effort is normal. No respiratory distress.     Breath sounds: Normal breath sounds. No stridor.  Abdominal:     General: Abdomen is flat.     Hernia: A hernia is present. Hernia is present in the right inguinal area.  Musculoskeletal:        General: No swelling.  Skin:  General: Skin is warm.  Neurological:     General: No focal deficit present.     Mental Status: She is alert and oriented to person, place, and time. Mental status is at baseline.  Psychiatric:        Mood and Affect: Mood normal.        Thought Content: Thought content normal.        Judgment: Judgment normal.        Assessment and Plan: Diagnoses and all orders for this visit:   Non-recurrent unilateral femoral hernia  without obstruction or gangrene     Elaine Rivera is a 76 y.o. female    1.          We will proceed to the OR for a laparoscopic right femoral hernia repair with mesh. 2.         All risks and benefits were discussed with the patient, to generally include infection, bleeding, damage to surrounding structures, acute and chronic nerve pain, and recurrence. Alternatives were offered and described.  All questions were answered and the patient voiced understanding of the procedure and wishes to proceed at this point.             No follow-ups on file.   Ralene Ok, MD, [facility], [facility]

## 2022-06-03 ENCOUNTER — Other Ambulatory Visit: Payer: Self-pay | Admitting: General Surgery

## 2022-06-03 ENCOUNTER — Encounter (HOSPITAL_COMMUNITY)
Admission: RE | Disposition: A | Discharge status: Home / Self Care | Payer: Self-pay | Source: Ambulatory Visit | Attending: General Surgery

## 2022-06-03 ENCOUNTER — Encounter (HOSPITAL_COMMUNITY): Payer: Self-pay | Admitting: General Surgery

## 2022-06-03 ENCOUNTER — Ambulatory Visit (HOSPITAL_COMMUNITY)
Admission: RE | Admit: 2022-06-03 | Discharge: 2022-06-03 | Disposition: A | Discharge status: Home / Self Care | Payer: Medicare PPO | Source: Ambulatory Visit | Attending: General Surgery | Admitting: General Surgery

## 2022-06-03 ENCOUNTER — Other Ambulatory Visit: Payer: Self-pay

## 2022-06-03 DIAGNOSIS — Z539 Procedure and treatment not carried out, unspecified reason: Secondary | ICD-10-CM | POA: Insufficient documentation

## 2022-06-03 DIAGNOSIS — K4090 Unilateral inguinal hernia, without obstruction or gangrene, not specified as recurrent: Secondary | ICD-10-CM | POA: Diagnosis present

## 2022-06-03 LAB — CBC WITH DIFFERENTIAL/PLATELET
Abs Immature Granulocytes: 0.02 10*3/uL (ref 0.00–0.07)
Basophils Absolute: 0 10*3/uL (ref 0.0–0.1)
Basophils Relative: 1 %
Eosinophils Absolute: 0 10*3/uL (ref 0.0–0.5)
Eosinophils Relative: 1 %
HCT: 41.3 % (ref 36.0–46.0)
Hemoglobin: 13.4 g/dL (ref 12.0–15.0)
Immature Granulocytes: 0 %
Lymphocytes Relative: 13 %
Lymphs Abs: 0.9 10*3/uL (ref 0.7–4.0)
MCH: 29.3 pg (ref 26.0–34.0)
MCHC: 32.4 g/dL (ref 30.0–36.0)
MCV: 90.2 fL (ref 80.0–100.0)
Monocytes Absolute: 0.6 10*3/uL (ref 0.1–1.0)
Monocytes Relative: 10 %
Neutro Abs: 4.9 10*3/uL (ref 1.7–7.7)
Neutrophils Relative %: 75 %
Platelets: 248 10*3/uL (ref 150–400)
RBC: 4.58 MIL/uL (ref 3.87–5.11)
RDW: 11.8 % (ref 11.5–15.5)
WBC: 6.4 10*3/uL (ref 4.0–10.5)
nRBC: 0 % (ref 0.0–0.2)

## 2022-06-03 LAB — COMPREHENSIVE METABOLIC PANEL
ALT: 13 U/L (ref 0–44)
AST: 21 U/L (ref 15–41)
Albumin: 3.3 g/dL — ABNORMAL LOW (ref 3.5–5.0)
Alkaline Phosphatase: 73 U/L (ref 38–126)
Anion gap: 10 (ref 5–15)
BUN: 16 mg/dL (ref 8–23)
CO2: 27 mmol/L (ref 22–32)
Calcium: 9.4 mg/dL (ref 8.9–10.3)
Chloride: 103 mmol/L (ref 98–111)
Creatinine, Ser: 0.76 mg/dL (ref 0.44–1.00)
GFR, Estimated: >60 mL/min (ref >60)
Glucose, Bld: 132 mg/dL — ABNORMAL HIGH (ref 70–99)
Potassium: 2.4 mmol/L — CL (ref 3.5–5.1)
Sodium: 140 mmol/L (ref 135–145)
Total Bilirubin: 0.7 mg/dL (ref 0.3–1.2)
Total Protein: 6.3 g/dL — ABNORMAL LOW (ref 6.5–8.1)

## 2022-06-03 SURGERY — HERNIA REPAIR FEMORAL
Anesthesia: General | Laterality: Right

## 2022-06-03 MED ORDER — ORAL CARE MOUTH RINSE: 15.0000 mL | Freq: Once | OROMUCOSAL | Status: AC

## 2022-06-03 MED ORDER — CHLORHEXIDINE GLUCONATE CLOTH 2 % EX PADS: 6.0000 | MEDICATED_PAD | Freq: Once | CUTANEOUS | Status: DC

## 2022-06-03 MED ORDER — ONDANSETRON HCL 4 MG/2ML IJ SOLN
INTRAMUSCULAR | Status: AC
Start: 2022-06-03 — End: ?
  Filled 2022-06-03: qty 2

## 2022-06-03 MED ORDER — POTASSIUM CHLORIDE CRYS ER 20 MEQ PO TBCR: 40.0000 meq | EXTENDED_RELEASE_TABLET | Freq: Every day | ORAL | 0 refills | Status: DC

## 2022-06-03 MED ORDER — ENSURE PRE-SURGERY PO LIQD: 296.0000 mL | Freq: Once | ORAL | Status: DC

## 2022-06-03 MED ORDER — ACETAMINOPHEN 500 MG PO TABS
1000.0000 mg | ORAL_TABLET | ORAL | Status: AC
  Administered 2022-06-03: 1000 mg via ORAL
  Filled 2022-06-03: qty 2

## 2022-06-03 MED ORDER — CEFAZOLIN SODIUM-DEXTROSE 2-4 GM/100ML-% IV SOLN
2.0000 g | INTRAVENOUS | Status: DC
  Filled 2022-06-03: qty 100

## 2022-06-03 MED ORDER — CHLORHEXIDINE GLUCONATE 0.12 % MT SOLN
15.0000 mL | Freq: Once | OROMUCOSAL | Status: AC
  Administered 2022-06-03: 15 mL via OROMUCOSAL

## 2022-06-03 MED ORDER — PROPOFOL 10 MG/ML IV BOLUS
INTRAVENOUS | Status: AC
  Filled 2022-06-03: qty 20

## 2022-06-03 MED ORDER — DEXAMETHASONE SODIUM PHOSPHATE 10 MG/ML IJ SOLN
INTRAMUSCULAR | Status: AC
Start: 2022-06-03 — End: ?
  Filled 2022-06-03: qty 1

## 2022-06-03 MED ORDER — FENTANYL CITRATE (PF) 100 MCG/2ML IJ SOLN
INTRAMUSCULAR | Status: AC
  Filled 2022-06-03: qty 2

## 2022-06-03 MED ORDER — ROCURONIUM BROMIDE 10 MG/ML (PF) SYRINGE
PREFILLED_SYRINGE | INTRAVENOUS | Status: AC
Start: 2022-06-03 — End: ?
  Filled 2022-06-03: qty 10

## 2022-06-03 MED ORDER — LACTATED RINGERS IV SOLN: INTRAVENOUS | Status: DC

## 2022-06-03 SURGICAL SUPPLY — 43 items
APPLIER CLIP 5 13 M/L LIGAMAX5 (MISCELLANEOUS)
CABLE HIGH FREQUENCY MONO STRZ (ELECTRODE) ×1 IMPLANT
CATH FOLEY 3WAY  5CC 16FR (CATHETERS) ×1
CATH FOLEY 3WAY 5CC 16FR (CATHETERS) ×1 IMPLANT
CHLORAPREP W/TINT 26 (MISCELLANEOUS) ×1 IMPLANT
CLIP APPLIE 5 13 M/L LIGAMAX5 (MISCELLANEOUS) IMPLANT
DERMABOND ADVANCED (GAUZE/BANDAGES/DRESSINGS) ×1
DERMABOND ADVANCED .7 DNX12 (GAUZE/BANDAGES/DRESSINGS) ×1 IMPLANT
DISSECTOR BLUNT TIP ENDO 5MM (MISCELLANEOUS) IMPLANT
ELECT REM PT RETURN 15FT ADLT (MISCELLANEOUS) ×1 IMPLANT
ENDOLOOP SUT PDS II  0 18 (SUTURE)
ENDOLOOP SUT PDS II 0 18 (SUTURE) IMPLANT
GLOVE BIO SURGEON STRL SZ7.5 (GLOVE) ×1 IMPLANT
GOWN STRL REUS W/ TWL XL LVL3 (GOWN DISPOSABLE) ×2 IMPLANT
GOWN STRL REUS W/TWL XL LVL3 (GOWN DISPOSABLE) ×2
IRRIG SUCT STRYKERFLOW 2 WTIP (MISCELLANEOUS)
IRRIGATION SUCT STRKRFLW 2 WTP (MISCELLANEOUS) IMPLANT
KIT BASIN OR (CUSTOM PROCEDURE TRAY) ×1 IMPLANT
KIT TURNOVER KIT A (KITS) IMPLANT
MARKER SKIN DUAL TIP RULER LAB (MISCELLANEOUS) ×1 IMPLANT
NEEDLE INSUFFLATION 14GA 120MM (NEEDLE) IMPLANT
PLUG CATH AND CAP STER (CATHETERS) ×1 IMPLANT
POUCH LAPAROSCOPIC INSTRUMENT (MISCELLANEOUS) ×2 IMPLANT
PROTECTOR NERVE ULNAR (MISCELLANEOUS) IMPLANT
RELOAD STAPLE HERNIA 4.0 BLUE (INSTRUMENTS) ×1 IMPLANT
RELOAD STAPLE HERNIA 4.8 BLK (STAPLE) IMPLANT
SCISSORS LAP 5X35 DISP (ENDOMECHANICALS) IMPLANT
SET IRRIG Y TYPE TUR BLADDER L (SET/KITS/TRAYS/PACK) ×1 IMPLANT
SET TUBE SMOKE EVAC HIGH FLOW (TUBING) ×1 IMPLANT
SPIKE FLUID TRANSFER (MISCELLANEOUS) ×1 IMPLANT
STAPLER HERNIA 12 8.5 360D (INSTRUMENTS) ×1 IMPLANT
SUT MNCRL AB 4-0 PS2 18 (SUTURE) ×1 IMPLANT
SUT VIC AB 1 CT1 27 (SUTURE)
SUT VIC AB 1 CT1 27XBRD ANBCTR (SUTURE) IMPLANT
SYR TOOMEY IRRIG 70ML (MISCELLANEOUS) ×1
SYRINGE TOOMEY IRRIG 70ML (MISCELLANEOUS) ×1 IMPLANT
TOWEL OR 17X26 10 PK STRL BLUE (TOWEL DISPOSABLE) ×1 IMPLANT
TOWEL OR NON WOVEN STRL DISP B (DISPOSABLE) ×1 IMPLANT
TRAY FOLEY MTR SLVR 14FR STAT (SET/KITS/TRAYS/PACK) ×1 IMPLANT
TRAY LAPAROSCOPIC (CUSTOM PROCEDURE TRAY) ×1 IMPLANT
TROCAR ADV FIXATION 12X100MM (TROCAR) ×1 IMPLANT
TROCAR OPTICAL SHORT 5MM (TROCAR) IMPLANT
TROCAR OPTICAL SLV SHORT 5MM (TROCAR) ×1 IMPLANT

## 2022-06-03 NOTE — Progress Notes (Signed)
Pt with some hypokalemia on today's labs. Pt states she had started taking her K+ this past week.  I d/w Dr. Johnnye Sima and he agrees case should be rescheduled.  I will give her a Rx for [place] and recheck labs x1 week.  Will hope to reschedule in 2-3weeks.

## 2022-06-10 NOTE — Progress Notes (Signed)
Surgery orders requested via Epic °

## 2022-06-11 ENCOUNTER — Encounter (HOSPITAL_COMMUNITY): Payer: Self-pay | Admitting: General Surgery

## 2022-06-11 NOTE — Progress Notes (Addendum)
For Short Stay: [place] appointment date: N/A Date of COVID positive in last 90 days: N/A  Bowel Prep reminder: N/A   For Anesthesia: PCP - Marijo Conception, FNP-C, [facility] Consult Note 12/30/21 in epic Cardiologist - N/A  Chest x-ray -  11/11/21 in epic EKG - 11/11/21 in epic Stress Test - N/A ECHO - 03/05/21 in epic Cardiac Cath - N/A Pacemaker/ICD device last checked: N/A Pacemaker orders received: N/A Device Rep notified: N/A  Spinal Cord Stimulator: N/A   Sleep Study -  N/A CPAP -  N/A  Fasting Blood Sugar - N/A Checks Blood Sugar ___N/A__ times a day Date and result of last Hgb A1c-N/A  Blood Thinner Instructions: N/A Aspirin Instructions: N/A Last Dose: N/A  Activity level:   Unable to go up a flight of stairs without chest pain and/or shortness of breath     Anesthesia review: right PICA aneurysm and stent, CVA, HTN, seizure disorder,   Patient denies shortness of breath, fever, cough and chest pain at PAT appointment   Patient verbalized understanding of instructions that were given to them at the PAT appointment. Patient was also instructed that they will need to review over the PAT instructions again at home before surgery.

## 2022-06-15 ENCOUNTER — Encounter (HOSPITAL_COMMUNITY)
Admission: RE | Admit: 2022-06-15 | Discharge: 2022-06-15 | Disposition: A | Discharge status: Home / Self Care | Payer: Medicare PPO | Source: Ambulatory Visit

## 2022-06-26 ENCOUNTER — Other Ambulatory Visit: Payer: Self-pay

## 2022-06-26 ENCOUNTER — Encounter (HOSPITAL_COMMUNITY): Payer: Self-pay | Admitting: General Surgery

## 2022-07-01 NOTE — Anesthesia Preprocedure Evaluation (Addendum)
Anesthesia Evaluation  Patient identified by MRN, date of birth, ID band Patient awake    Reviewed: Allergy & Precautions, NPO status , Patient's Chart, lab work & pertinent test results  History of Anesthesia Complications (+) PONV  Airway Mallampati: II  TM Distance: >3 FB Neck ROM: Full    Dental  (+) Dental Advisory Given, Chipped   Pulmonary former smoker,    breath sounds clear to auscultation       Cardiovascular hypertension (no BP meds presently ), (-) angina Rhythm:Regular Rate:Normal  '22 ECHO: EF 60-65%, normal LVF, mild LVH, grade 1 DD, mild MR   Neuro/Psych Seizures -,  Anxiety Depression H/o PICA: aneurysm repair CVA, No Residual Symptoms    GI/Hepatic Neg liver ROS, hiatal hernia, GERD  Medicated and Controlled,diarrhea   Endo/Other  negative endocrine ROS  Renal/GU stones     Musculoskeletal   Abdominal   Peds  Hematology   Anesthesia Other Findings Cancelled 05/2022 because K+ 2.4   Reproductive/Obstetrics                            Anesthesia Physical Anesthesia Plan  ASA: 3  Anesthesia Plan: General   Post-op Pain Management: Tylenol PO (pre-op)*   Induction: Intravenous  PONV Risk Score and Plan: 4 or greater and Ondansetron, Dexamethasone and Treatment may vary due to age or medical condition  Airway Management Planned: Oral ETT  Additional Equipment: None  Intra-op Plan:   Post-operative Plan: Extubation in OR  Informed Consent: I have reviewed the patients History and Physical, chart, labs and discussed the procedure including the risks, benefits and alternatives for the proposed anesthesia with the patient or authorized representative who has indicated his/her understanding and acceptance.     Dental advisory given  Plan Discussed with: CRNA and Surgeon  Anesthesia Plan Comments:        Anesthesia Quick Evaluation

## 2022-07-02 ENCOUNTER — Ambulatory Visit (HOSPITAL_COMMUNITY)
Admission: RE | Admit: 2022-07-02 | Discharge: 2022-07-02 | Disposition: A | Discharge status: Home / Self Care | Payer: Medicare PPO | Attending: General Surgery | Admitting: General Surgery

## 2022-07-02 ENCOUNTER — Other Ambulatory Visit: Payer: Self-pay

## 2022-07-02 ENCOUNTER — Encounter (HOSPITAL_COMMUNITY): Payer: Self-pay | Admitting: General Surgery

## 2022-07-02 ENCOUNTER — Ambulatory Visit (HOSPITAL_COMMUNITY): Payer: Medicare PPO | Admitting: Physician Assistant

## 2022-07-02 ENCOUNTER — Encounter (HOSPITAL_COMMUNITY)
Admission: RE | Disposition: A | Discharge status: Home / Self Care | Payer: Self-pay | Source: Home / Self Care | Attending: General Surgery

## 2022-07-02 ENCOUNTER — Ambulatory Visit (HOSPITAL_BASED_OUTPATIENT_CLINIC_OR_DEPARTMENT_OTHER): Payer: Medicare PPO | Admitting: Physician Assistant

## 2022-07-02 DIAGNOSIS — E876 Hypokalemia: Secondary | ICD-10-CM

## 2022-07-02 DIAGNOSIS — Z87891 Personal history of nicotine dependence: Secondary | ICD-10-CM | POA: Diagnosis not present

## 2022-07-02 DIAGNOSIS — Z8673 Personal history of transient ischemic attack (TIA), and cerebral infarction without residual deficits: Secondary | ICD-10-CM | POA: Diagnosis not present

## 2022-07-02 DIAGNOSIS — Z79899 Other long term (current) drug therapy: Secondary | ICD-10-CM | POA: Diagnosis not present

## 2022-07-02 DIAGNOSIS — K4090 Unilateral inguinal hernia, without obstruction or gangrene, not specified as recurrent: Secondary | ICD-10-CM | POA: Diagnosis not present

## 2022-07-02 DIAGNOSIS — F418 Other specified anxiety disorders: Secondary | ICD-10-CM | POA: Diagnosis not present

## 2022-07-02 DIAGNOSIS — I10 Essential (primary) hypertension: Secondary | ICD-10-CM

## 2022-07-02 DIAGNOSIS — K219 Gastro-esophageal reflux disease without esophagitis: Secondary | ICD-10-CM | POA: Insufficient documentation

## 2022-07-02 HISTORY — DX: Herpesviral infection, unspecified: B00.9

## 2022-07-02 HISTORY — DX: Adult failure to thrive: R62.7

## 2022-07-02 HISTORY — DX: Aphasia: R47.01

## 2022-07-02 HISTORY — DX: Aneurysm of unspecified site: I72.9

## 2022-07-02 HISTORY — DX: Hypokalemia: E87.6

## 2022-07-02 HISTORY — DX: Other chronic pain: G89.29

## 2022-07-02 HISTORY — DX: Headache, unspecified: R51.9

## 2022-07-02 HISTORY — PX: FEMORAL HERNIA REPAIR: SHX632

## 2022-07-02 HISTORY — DX: Scoliosis, unspecified: M41.9

## 2022-07-02 HISTORY — DX: Unspecified severe protein-calorie malnutrition: E43

## 2022-07-02 LAB — CBC WITH DIFFERENTIAL/PLATELET
Abs Immature Granulocytes: 0.03 10*3/uL (ref 0.00–0.07)
Basophils Absolute: 0 10*3/uL (ref 0.0–0.1)
Basophils Relative: 1 %
Eosinophils Absolute: 0.1 10*3/uL (ref 0.0–0.5)
Eosinophils Relative: 2 %
HCT: 43.5 % (ref 36.0–46.0)
Hemoglobin: 13.7 g/dL (ref 12.0–15.0)
Immature Granulocytes: 1 %
Lymphocytes Relative: 18 %
Lymphs Abs: 1 10*3/uL (ref 0.7–4.0)
MCH: 30.2 pg (ref 26.0–34.0)
MCHC: 31.5 g/dL (ref 30.0–36.0)
MCV: 95.8 fL (ref 80.0–100.0)
Monocytes Absolute: 0.4 10*3/uL (ref 0.1–1.0)
Monocytes Relative: 8 %
Neutro Abs: 3.8 10*3/uL (ref 1.7–7.7)
Neutrophils Relative %: 70 %
Platelets: 245 10*3/uL (ref 150–400)
RBC: 4.54 MIL/uL (ref 3.87–5.11)
RDW: 13.1 % (ref 11.5–15.5)
WBC: 5.3 10*3/uL (ref 4.0–10.5)
nRBC: 0 % (ref 0.0–0.2)

## 2022-07-02 LAB — BASIC METABOLIC PANEL
Anion gap: 10 (ref 5–15)
BUN: 15 mg/dL (ref 8–23)
CO2: 22 mmol/L (ref 22–32)
Calcium: 9.7 mg/dL (ref 8.9–10.3)
Chloride: 108 mmol/L (ref 98–111)
Creatinine, Ser: 0.75 mg/dL (ref 0.44–1.00)
GFR, Estimated: >60 mL/min (ref >60)
Glucose, Bld: 86 mg/dL (ref 70–99)
Potassium: 4.8 mmol/L (ref 3.5–5.1)
Sodium: 140 mmol/L (ref 135–145)

## 2022-07-02 SURGERY — HERNIA REPAIR FEMORAL
Anesthesia: General | Laterality: Right

## 2022-07-02 MED ORDER — DEXAMETHASONE SODIUM PHOSPHATE 10 MG/ML IJ SOLN
INTRAMUSCULAR | Status: AC
  Filled 2022-07-02: qty 1

## 2022-07-02 MED ORDER — ROCURONIUM BROMIDE 10 MG/ML (PF) SYRINGE
PREFILLED_SYRINGE | INTRAVENOUS | Status: AC
  Filled 2022-07-02: qty 20

## 2022-07-02 MED ORDER — FENTANYL CITRATE PF 50 MCG/ML IJ SOSY
25.0000 ug | PREFILLED_SYRINGE | INTRAMUSCULAR | Status: DC | PRN
  Administered 2022-07-02: 25 ug via INTRAVENOUS

## 2022-07-02 MED ORDER — PHENYLEPHRINE 80 MCG/ML (10ML) SYRINGE FOR IV PUSH (FOR BLOOD PRESSURE SUPPORT)
PREFILLED_SYRINGE | INTRAVENOUS | Status: DC | PRN
  Administered 2022-07-02: 160 ug via INTRAVENOUS

## 2022-07-02 MED ORDER — PHENYLEPHRINE 80 MCG/ML (10ML) SYRINGE FOR IV PUSH (FOR BLOOD PRESSURE SUPPORT)
PREFILLED_SYRINGE | INTRAVENOUS | Status: AC
  Filled 2022-07-02: qty 10

## 2022-07-02 MED ORDER — FENTANYL CITRATE PF 50 MCG/ML IJ SOSY
PREFILLED_SYRINGE | INTRAMUSCULAR | Status: AC
  Administered 2022-07-02: 50 ug via INTRAVENOUS
  Filled 2022-07-02: qty 1

## 2022-07-02 MED ORDER — BUPIVACAINE-EPINEPHRINE (PF) 0.25% -1:200000 IJ SOLN
INTRAMUSCULAR | Status: AC
  Filled 2022-07-02: qty 30

## 2022-07-02 MED ORDER — CEFAZOLIN SODIUM-DEXTROSE 2-4 GM/100ML-% IV SOLN
INTRAVENOUS | Status: AC
  Filled 2022-07-02: qty 100

## 2022-07-02 MED ORDER — ORAL CARE MOUTH RINSE
15.0000 mL | Freq: Once | OROMUCOSAL | Status: AC
Start: 2022-07-02 — End: 2022-07-02

## 2022-07-02 MED ORDER — SUGAMMADEX SODIUM 200 MG/2ML IV SOLN
INTRAVENOUS | Status: DC | PRN
  Administered 2022-07-02: 140 mg via INTRAVENOUS

## 2022-07-02 MED ORDER — ROCURONIUM BROMIDE 10 MG/ML (PF) SYRINGE
PREFILLED_SYRINGE | INTRAVENOUS | Status: DC | PRN
  Administered 2022-07-02: 50 mg via INTRAVENOUS

## 2022-07-02 MED ORDER — FENTANYL CITRATE (PF) 250 MCG/5ML IJ SOLN
INTRAMUSCULAR | Status: DC | PRN
  Administered 2022-07-02 (×2): 50 ug via INTRAVENOUS

## 2022-07-02 MED ORDER — AMISULPRIDE (ANTIEMETIC) 5 MG/2ML IV SOLN
5.0000 mg | Freq: Once | INTRAVENOUS | Status: AC
  Administered 2022-07-02: 5 mg via INTRAVENOUS

## 2022-07-02 MED ORDER — 0.9 % SODIUM CHLORIDE (POUR BTL) OPTIME
TOPICAL | Status: DC | PRN
  Administered 2022-07-02: 1000 mL

## 2022-07-02 MED ORDER — ONDANSETRON HCL 4 MG/2ML IJ SOLN
INTRAMUSCULAR | Status: AC
  Filled 2022-07-02: qty 2

## 2022-07-02 MED ORDER — ACETAMINOPHEN 500 MG PO TABS
1000.0000 mg | ORAL_TABLET | Freq: Once | ORAL | Status: AC
Start: 2022-07-02 — End: 2022-07-02
  Administered 2022-07-02: 1000 mg via ORAL
  Filled 2022-07-02: qty 2

## 2022-07-02 MED ORDER — EPHEDRINE 5 MG/ML INJ
INTRAVENOUS | Status: AC
  Filled 2022-07-02: qty 5

## 2022-07-02 MED ORDER — LABETALOL HCL 5 MG/ML IV SOLN: 5.0000 mg | INTRAVENOUS | Status: DC | PRN

## 2022-07-02 MED ORDER — LACTATED RINGERS IV SOLN: INTRAVENOUS | Status: DC

## 2022-07-02 MED ORDER — DEXAMETHASONE SODIUM PHOSPHATE 10 MG/ML IJ SOLN
INTRAMUSCULAR | Status: DC | PRN
  Administered 2022-07-02: 5 mg via INTRAVENOUS

## 2022-07-02 MED ORDER — PROPOFOL 10 MG/ML IV BOLUS
INTRAVENOUS | Status: DC | PRN
  Administered 2022-07-02: 100 mg via INTRAVENOUS

## 2022-07-02 MED ORDER — LIDOCAINE 2% (20 MG/ML) 5 ML SYRINGE
INTRAMUSCULAR | Status: DC | PRN
  Administered 2022-07-02: 40 mg via INTRAVENOUS

## 2022-07-02 MED ORDER — ONDANSETRON HCL 4 MG/2ML IJ SOLN
INTRAMUSCULAR | Status: DC | PRN
  Administered 2022-07-02: 4 mg via INTRAVENOUS

## 2022-07-02 MED ORDER — FENTANYL CITRATE (PF) 100 MCG/2ML IJ SOLN
INTRAMUSCULAR | Status: AC
  Filled 2022-07-02: qty 2

## 2022-07-02 MED ORDER — LIDOCAINE HCL (PF) 2 % IJ SOLN
INTRAMUSCULAR | Status: AC
  Filled 2022-07-02: qty 5

## 2022-07-02 MED ORDER — LACTATED RINGERS IR SOLN
Status: DC | PRN
  Administered 2022-07-02: 1000 mL

## 2022-07-02 MED ORDER — BUPIVACAINE-EPINEPHRINE 0.25% -1:200000 IJ SOLN
INTRAMUSCULAR | Status: DC | PRN
  Administered 2022-07-02: 7 mL

## 2022-07-02 MED ORDER — AMISULPRIDE (ANTIEMETIC) 5 MG/2ML IV SOLN
INTRAVENOUS | Status: AC
  Filled 2022-07-02: qty 2

## 2022-07-02 MED ORDER — PROPOFOL 10 MG/ML IV BOLUS
INTRAVENOUS | Status: AC
  Filled 2022-07-02: qty 20

## 2022-07-02 MED ORDER — CHLORHEXIDINE GLUCONATE 0.12 % MT SOLN
15.0000 mL | Freq: Once | OROMUCOSAL | Status: AC
  Administered 2022-07-02: 15 mL via OROMUCOSAL

## 2022-07-02 MED ORDER — LABETALOL HCL 5 MG/ML IV SOLN
INTRAVENOUS | Status: AC
  Filled 2022-07-02: qty 4

## 2022-07-02 MED ORDER — EPHEDRINE SULFATE-NACL 50-0.9 MG/10ML-% IV SOSY
PREFILLED_SYRINGE | INTRAVENOUS | Status: DC | PRN
  Administered 2022-07-02: 10 mg via INTRAVENOUS

## 2022-07-02 MED ORDER — OXYCODONE-ACETAMINOPHEN 5-325 MG PO TABS: 1.0000 | ORAL_TABLET | Freq: Four times a day (QID) | ORAL | 0 refills | Status: AC | PRN

## 2022-07-02 MED ORDER — CEFAZOLIN SODIUM-DEXTROSE 2-4 GM/100ML-% IV SOLN
2.0000 g | Freq: Once | INTRAVENOUS | Status: AC
  Administered 2022-07-02: 2 g via INTRAVENOUS

## 2022-07-02 MED ORDER — PHENYLEPHRINE HCL-NACL 20-0.9 MG/250ML-% IV SOLN
INTRAVENOUS | Status: AC
  Filled 2022-07-02: qty 250

## 2022-07-02 SURGICAL SUPPLY — 46 items
APPLIER CLIP 5 13 M/L LIGAMAX5 (MISCELLANEOUS)
CABLE HIGH FREQUENCY MONO STRZ (ELECTRODE) ×1 IMPLANT
CATH FOLEY 3WAY  5CC 16FR (CATHETERS)
CATH FOLEY 3WAY 5CC 16FR (CATHETERS) ×1 IMPLANT
CHLORAPREP W/TINT 26 (MISCELLANEOUS) ×1 IMPLANT
CLIP APPLIE 5 13 M/L LIGAMAX5 (MISCELLANEOUS) IMPLANT
DERMABOND ADVANCED .7 DNX12 (GAUZE/BANDAGES/DRESSINGS) ×1 IMPLANT
DISSECTOR BLUNT TIP ENDO 5MM (MISCELLANEOUS) IMPLANT
ELECT REM PT RETURN 15FT ADLT (MISCELLANEOUS) ×1 IMPLANT
ENDOLOOP SUT PDS II  0 18 (SUTURE)
ENDOLOOP SUT PDS II 0 18 (SUTURE) IMPLANT
GLOVE BIO SURGEON STRL SZ7.5 (GLOVE) ×1 IMPLANT
GOWN STRL REUS W/ TWL XL LVL3 (GOWN DISPOSABLE) ×2 IMPLANT
GOWN STRL REUS W/TWL XL LVL3 (GOWN DISPOSABLE) ×2
IRRIG SUCT STRYKERFLOW 2 WTIP (MISCELLANEOUS) ×1
IRRIGATION SUCT STRKRFLW 2 WTP (MISCELLANEOUS) IMPLANT
KIT BASIN OR (CUSTOM PROCEDURE TRAY) ×1 IMPLANT
KIT TURNOVER KIT A (KITS) IMPLANT
MARKER SKIN DUAL TIP RULER LAB (MISCELLANEOUS) ×1 IMPLANT
MESH 3DMAX 4X6 RT LRG (Mesh General) IMPLANT
NDL INSUFFLATION 14GA 120MM (NEEDLE) IMPLANT
NEEDLE INSUFFLATION 14GA 120MM (NEEDLE) ×1 IMPLANT
PLUG CATH AND CAP STER (CATHETERS) ×1 IMPLANT
POUCH LAPAROSCOPIC INSTRUMENT (MISCELLANEOUS) ×2 IMPLANT
PROTECTOR NERVE ULNAR (MISCELLANEOUS) IMPLANT
RELOAD STAPLE 4.0 BLU F/HERNIA (INSTRUMENTS) ×1 IMPLANT
RELOAD STAPLE 4.8 BLK F/HERNIA (STAPLE) IMPLANT
RELOAD STAPLE HERNIA 4.0 BLUE (INSTRUMENTS) ×1 IMPLANT
RELOAD STAPLE HERNIA 4.8 BLK (STAPLE) IMPLANT
SCISSORS LAP 5X35 DISP (ENDOMECHANICALS) IMPLANT
SET IRRIG Y TYPE TUR BLADDER L (SET/KITS/TRAYS/PACK) ×1 IMPLANT
SET TUBE SMOKE EVAC HIGH FLOW (TUBING) ×1 IMPLANT
SPIKE FLUID TRANSFER (MISCELLANEOUS) ×1 IMPLANT
STAPLER HERNIA 12 8.5 360D (INSTRUMENTS) ×1 IMPLANT
SUT MNCRL AB 4-0 PS2 18 (SUTURE) ×1 IMPLANT
SUT VIC AB 1 CT1 27 (SUTURE)
SUT VIC AB 1 CT1 27XBRD ANBCTR (SUTURE) IMPLANT
SYR TOOMEY IRRIG 70ML (MISCELLANEOUS)
SYRINGE TOOMEY IRRIG 70ML (MISCELLANEOUS) ×1 IMPLANT
TOWEL OR 17X26 10 PK STRL BLUE (TOWEL DISPOSABLE) ×1 IMPLANT
TOWEL OR NON WOVEN STRL DISP B (DISPOSABLE) ×1 IMPLANT
TRAY FOLEY MTR SLVR 14FR STAT (SET/KITS/TRAYS/PACK) ×1 IMPLANT
TRAY LAPAROSCOPIC (CUSTOM PROCEDURE TRAY) ×1 IMPLANT
TROCAR ADV FIXATION 12X100MM (TROCAR) ×1 IMPLANT
TROCAR OPTICAL SHORT 5MM (TROCAR) IMPLANT
TROCAR OPTICAL SLV SHORT 5MM (TROCAR) ×1 IMPLANT

## 2022-07-02 NOTE — Anesthesia Postprocedure Evaluation (Signed)
Anesthesia Post Note  Patient: Bradley M Weyland  Procedure(s) Performed: Laparoscopic right femoral hernia with mesh (Right)     Patient location during evaluation: PACU Anesthesia Type: General Level of consciousness: awake and alert, patient cooperative and oriented Pain management: pain level controlled Vital Signs Assessment: post-procedure vital signs reviewed and stable Respiratory status: spontaneous breathing, nonlabored ventilation and respiratory function stable Cardiovascular status: blood pressure returned to baseline and stable Postop Assessment: no apparent nausea or vomiting and adequate PO intake Anesthetic complications: no   No notable events documented.  Last Vitals:  Vitals:   07/02/22 0930 07/02/22 0945  BP: (!) 143/92 (!) 155/95  Pulse: 74 88  Resp: 19 19  Temp: 36.4 C 36.4 C  SpO2: 92% 94%    Last Pain:  Vitals:   07/02/22 0945  TempSrc:   PainSc: 5                  Leisel Pinette,E. Deann Mclaine

## 2022-07-02 NOTE — Op Note (Signed)
07/02/2022  8:30 AM  PATIENT:  Elaine Rivera  76 y.o. female  PRE-OPERATIVE DIAGNOSIS:  RIGHT FEMORAL HERNIA  POST-OPERATIVE DIAGNOSIS:  RIGHT DIRECT HERNIA  PROCEDURE:  Procedure(s): Laparoscopic right direct inguinal hernia with mesh (Right)  SURGEON:  Surgeon(s) and Role:    Ralene Ok, MD - Primary   ASSISTANTS: Dr. Lossie Faes,  PGY-5  ANESTHESIA: Local GEneral  EBL:  15 cc  BLOOD ADMINISTERED:none  DRAINS: none   LOCAL MEDICATIONS USED:  BUPIVICAINE   SPECIMEN:  No Specimen  DISPOSITION OF SPECIMEN:  N/A  COUNTS:  YES  TOURNIQUET:  * No tourniquets in log *  DICTATION: .Dragon Dictation Counts: reported as correct x 2  Findings:  The patient had a small right direct hernia and cord lipoma  Indications for procedure:  The patient is a 76 year old female with a right hernia for several months. Patient complained of symptomatology to his right inguinal area. The patient was taken back for elective inguinal hernia repair.  Details of the procedure: The patient was taken back to the operating room. The patient was placed in supine position with bilateral SCDs in place.  The patient was prepped and draped in the usual sterile fashion.  After appropriate anitbiotics were confirmed, a time-out was confirmed and all facts were verified.  0.25% Marcaine was used to infiltrate the umbilical area. A 11-blade was used to cut down the skin and blunt dissection was used to get the anterior fashion.  The anterior fascia was incised approximately 1 cm and the muscles were retracted laterally. Blunt dissection was then used to create a space in the preperitoneal area. At this time a 10 mm camera was then introduced into the space and advanced the pubic tubercle and a 12 mm trocar was placed over this and insufflation was started.  At this time and space was created from medial to laterally the preperitoneal space.  Cooper's ligament was initially cleaned off.  The hernia  was identified in the direct space. Dissection of the hernia sac was undertaken and it was fully reduced.  The transversalis fascia retracted spontaneously.    The round ligament was seen and the peritoneum was dissected back from the abdominal wall.  The round ligament was cauterized and transected.  The peritoneum was taken down to approximately the umbilicus a Bard 3D Max mesh, size: Large, was  introduced into the preperitoneal space.  The mesh was brought over to cover the direct and indirect hernia spaces.  This was anchored into place and secured to Cooper's ligament with 4.54m staples from a [place] hernia stapler. It was anchored to the anterior abdominal wall with 4.8 mm staples. The peritoneum was seen lying posterior to the mesh. There was no staples placed laterally. The insufflation was evacuated and the peritoneum was seen posterior to the mesh. The trochars were removed. The anterior fascia was reapproximated using #1 Vicryl on a UR- 6.  Intra-abdominal air was evacuated and the Veress needle removed. The skin was reapproximated using 4-0 Monocryl subcuticular fashion.  The skin was dressed with Dermabond.  The patient was awakened from general anesthesia and taken to recovery in stable condition.    PLAN OF CARE: Discharge to home after PACU  PATIENT DISPOSITION:  PACU - hemodynamically stable.   Delay start of Pharmacological VTE agent (>24hrs) due to surgical blood loss or risk of bleeding: not applicable

## 2022-07-02 NOTE — Discharge Instructions (Signed)
[facility] _______[facility]  HERNIA REPAIR: POST OP INSTRUCTIONS  Always review your discharge instruction sheet given to you by the facility where your surgery was performed. IF YOU HAVE DISABILITY OR FAMILY LEAVE FORMS, YOU MUST BRING THEM TO THE OFFICE FOR PROCESSING.   DO NOT GIVE THEM TO YOUR DOCTOR.  1. A  prescription for pain medication may be given to you upon discharge.  Take your pain medication as prescribed, if needed.  If narcotic pain medicine is not needed, then you may take acetaminophen (Tylenol) or ibuprofen (Advil) as needed. 2. Take your usually prescribed medications unless otherwise directed. If you need a refill on your pain medication, please contact your pharmacy.  They will contact our office to request authorization. Prescriptions will not be filled after 5 pm or on week-ends. 3. You should follow a light diet the first 24 hours after arrival home, such as soup and crackers, etc.  Be sure to include lots of fluids daily.  Resume your normal diet the day after surgery. 4.Most patients will experience some swelling and bruising around the umbilicus or in the groin and scrotum.  Ice packs and reclining will help.  Swelling and bruising can take several days to resolve.  6. It is common to experience some constipation if taking pain medication after surgery.  Increasing fluid intake and taking a stool softener (such as Colace) will usually help or prevent this problem from occurring.  A mild laxative (Milk of Magnesia or Miralax) should be taken according to package directions if there are no bowel movements after 48 hours. 7. Unless discharge instructions indicate otherwise, you may remove your bandages 24-48 hours after surgery, and you may shower at that time.  You may have steri-strips (small skin tapes) in place directly over the incision.  These strips should be left on the skin for 7-10 days.  If your surgeon used skin glue on the incision, you may shower in  24 hours.  The glue will flake off over the next 2-3 weeks.  Any sutures or staples will be removed at the office during your follow-up visit. 8. ACTIVITIES:  You may resume regular (light) daily activities beginning the next day--such as daily self-care, walking, climbing stairs--gradually increasing activities as tolerated.  You may have sexual intercourse when it is comfortable.  Refrain from any heavy lifting or straining until approved by your doctor.  a.You may drive when you are no longer taking prescription pain medication, you can comfortably wear a seatbelt, and you can safely maneuver your car and apply brakes. b.RETURN TO WORK:   _____________________________________________  9.You should see your doctor in the office for a follow-up appointment approximately 2-3 weeks after your surgery.  Make sure that you call for this appointment within a day or two after you arrive home to insure a convenient appointment time. 10.OTHER INSTRUCTIONS: _________________________    _____________________________________  WHEN TO CALL YOUR DOCTOR: Fever over 101.0 Inability to urinate Nausea and/or vomiting Extreme swelling or bruising Continued bleeding from incision. Increased pain, redness, or drainage from the incision  The clinic staff is available to answer your questions during regular business hours.  Please don't hesitate to call and ask to speak to one of the nurses for clinical concerns.  If you have a medical emergency, go to the nearest emergency room or call 911.  A surgeon from [facility] is always on call at the hospital   [address], [address], [place], [place]    [address] ?  [address] [address], [place], [place]   [address] [phone] ? [phone] ? FAX [phone] Web site: [email or web address]  

## 2022-07-02 NOTE — Transfer of Care (Signed)
Immediate Anesthesia Transfer of Care Note  Patient: Elaine Rivera  Procedure(s) Performed: Laparoscopic right femoral hernia with mesh (Right)  Patient Location: PACU  Anesthesia Type:General  Level of Consciousness: sedated  Airway & Oxygen Therapy: Patient Spontanous Breathing and Patient connected to face mask oxygen  Post-op Assessment: Report given to RN and Post -op Vital signs reviewed and stable  Post vital signs: Reviewed and stable  Last Vitals:  Vitals Value Taken Time  BP 192/109 07/02/22 0841  Temp    Pulse 80 07/02/22 0843  Resp 21 07/02/22 0843  SpO2 100 % 07/02/22 0843  Vitals shown include unvalidated device data.  Last Pain:  Vitals:   07/02/22 0613  TempSrc:   PainSc: 0-No pain         Complications: No notable events documented.

## 2022-07-02 NOTE — Interval H&P Note (Signed)
History and Physical Interval Note:  07/02/2022 7:29 AM  Elaine Rivera  has presented today for surgery, with the diagnosis of RIGHT FEMORAL HERNIA.  The various methods of treatment have been discussed with the patient and family. After consideration of risks, benefits and other options for treatment, the patient has consented to  Procedure(s): Laparoscopic right femoral hernia with mesh (Right) as a surgical intervention.  The patient's history has been reviewed, patient examined, no change in status, stable for surgery.  I have reviewed the patient's chart and labs.  Questions were answered to the patient's satisfaction.     Ralene Ok

## 2022-07-02 NOTE — Anesthesia Procedure Notes (Signed)
Procedure Name: Intubation Date/Time: 07/02/2022 7:53 AM  Performed by: Talbot Grumbling, CRNAPre-anesthesia Checklist: Patient identified, Emergency Drugs available, Patient being monitored and Suction available Patient Re-evaluated:Patient Re-evaluated prior to induction Oxygen Delivery Method: Circle system utilized Preoxygenation: Pre-oxygenation with 100% oxygen Induction Type: IV induction Ventilation: Mask ventilation without difficulty Laryngoscope Size: Mac and 3 Grade View: Grade I Tube type: Oral Tube size: 7.0 mm Number of attempts: 1 Airway Equipment and Method: Stylet Placement Confirmation: ETT inserted through vocal cords under direct vision, positive ETCO2 and breath sounds checked- equal and bilateral Secured at: 21 cm Tube secured with: Tape Dental Injury: Teeth and Oropharynx as per pre-operative assessment

## 2022-07-03 ENCOUNTER — Encounter (HOSPITAL_COMMUNITY): Payer: Self-pay | Admitting: General Surgery

## 2022-10-02 DIAGNOSIS — R202 Paresthesia of skin: Secondary | ICD-10-CM | POA: Diagnosis not present

## 2022-10-02 DIAGNOSIS — E875 Hyperkalemia: Secondary | ICD-10-CM | POA: Diagnosis not present

## 2022-10-02 DIAGNOSIS — N390 Urinary tract infection, site not specified: Secondary | ICD-10-CM | POA: Diagnosis not present

## 2022-10-26 DIAGNOSIS — K219 Gastro-esophageal reflux disease without esophagitis: Secondary | ICD-10-CM | POA: Diagnosis not present

## 2022-10-26 DIAGNOSIS — M810 Age-related osteoporosis without current pathological fracture: Secondary | ICD-10-CM | POA: Diagnosis not present

## 2022-10-26 DIAGNOSIS — G8929 Other chronic pain: Secondary | ICD-10-CM | POA: Diagnosis not present

## 2022-10-26 DIAGNOSIS — F329 Major depressive disorder, single episode, unspecified: Secondary | ICD-10-CM | POA: Diagnosis not present

## 2022-10-26 DIAGNOSIS — I10 Essential (primary) hypertension: Secondary | ICD-10-CM | POA: Diagnosis not present

## 2022-10-26 DIAGNOSIS — R32 Unspecified urinary incontinence: Secondary | ICD-10-CM | POA: Diagnosis not present

## 2022-10-26 DIAGNOSIS — R569 Unspecified convulsions: Secondary | ICD-10-CM | POA: Diagnosis not present

## 2022-10-26 DIAGNOSIS — E785 Hyperlipidemia, unspecified: Secondary | ICD-10-CM | POA: Diagnosis not present

## 2022-10-26 DIAGNOSIS — E46 Unspecified protein-calorie malnutrition: Secondary | ICD-10-CM | POA: Diagnosis not present

## 2022-11-11 DIAGNOSIS — R300 Dysuria: Secondary | ICD-10-CM | POA: Diagnosis not present

## 2022-11-11 DIAGNOSIS — N3000 Acute cystitis without hematuria: Secondary | ICD-10-CM | POA: Diagnosis not present

## 2022-11-20 DIAGNOSIS — B961 Klebsiella pneumoniae [K. pneumoniae] as the cause of diseases classified elsewhere: Secondary | ICD-10-CM | POA: Diagnosis not present

## 2022-11-20 DIAGNOSIS — M549 Dorsalgia, unspecified: Secondary | ICD-10-CM | POA: Diagnosis not present

## 2022-11-20 DIAGNOSIS — G8929 Other chronic pain: Secondary | ICD-10-CM | POA: Diagnosis not present

## 2022-11-20 DIAGNOSIS — N3090 Cystitis, unspecified without hematuria: Secondary | ICD-10-CM | POA: Diagnosis not present

## 2022-11-20 DIAGNOSIS — Q675 Congenital deformity of spine: Secondary | ICD-10-CM | POA: Diagnosis not present

## 2022-11-23 DIAGNOSIS — M549 Dorsalgia, unspecified: Secondary | ICD-10-CM | POA: Diagnosis not present

## 2022-11-23 DIAGNOSIS — Q675 Congenital deformity of spine: Secondary | ICD-10-CM | POA: Diagnosis not present

## 2022-11-23 DIAGNOSIS — B961 Klebsiella pneumoniae [K. pneumoniae] as the cause of diseases classified elsewhere: Secondary | ICD-10-CM | POA: Diagnosis not present

## 2022-11-23 DIAGNOSIS — Z139 Encounter for screening, unspecified: Secondary | ICD-10-CM | POA: Diagnosis not present

## 2022-11-23 DIAGNOSIS — N3090 Cystitis, unspecified without hematuria: Secondary | ICD-10-CM | POA: Diagnosis not present

## 2022-11-23 DIAGNOSIS — G8929 Other chronic pain: Secondary | ICD-10-CM | POA: Diagnosis not present

## 2022-11-27 IMAGING — MR MR HEAD W/O CM
10 of 14 series · 37 of 48 positions shown · non-contrast
Comparison: Prior CTs from 03/04/2021 and MRI from 11/17/2019.

CLINICAL DATA: Initial evaluation for acute TIA.

EXAM:
MRI HEAD WITHOUT CONTRAST
TECHNIQUE: Multiplanar, multiecho pulse sequences of the brain and surrounding
structures were obtained without intravenous contrast.

[Series 5: DWI · axial · 3.0mm · 1.36mm/px · z∈[-43,+92]mm · 6 of 96 slices shown (1 of 2)]
[im 1/96]
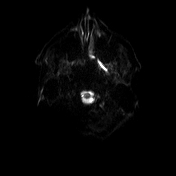
[im 20/96]
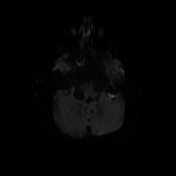
[im 39/96]
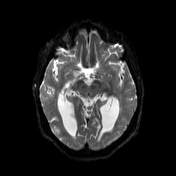
[im 58/96]
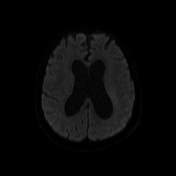
[im 77/96]
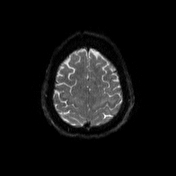
[im 96/96]
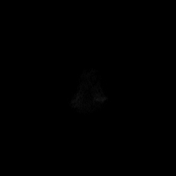

[Series 6: DWI · axial · 3.0mm · 1.36mm/px · z∈[-43,+92]mm · 2 of 48 slices shown (2 of 2)]
[im 1/48]
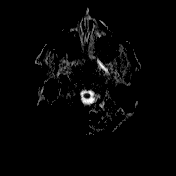
[im 48/48]
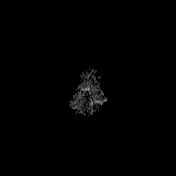

[Series 7: T1 · sagittal · 5.0mm · 0.75mm/px · 1 of 24 slices shown (1 of 2)]
[im 1/24]
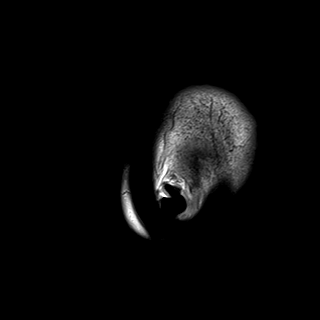

[Series 8: T2 · axial · 5.0mm · 0.62mm/px · z∈[-54,+102]mm · 2 of 26 slices shown (1 of 2)]
[im 1/26]
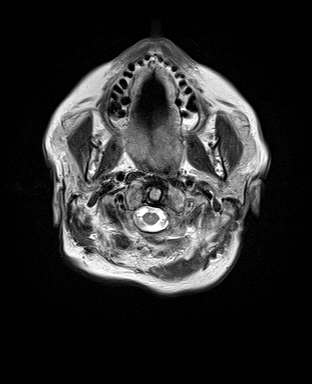
[im 26/26]
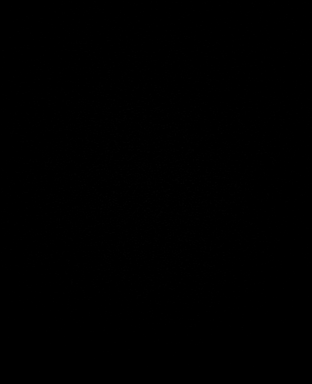

[Series 9: FLAIR · axial · 3.0mm · 0.75mm/px · z∈[-50,+97]mm · 4 of 52 slices shown]
[im 1/52]
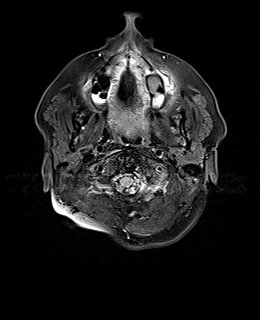
[im 18/52]
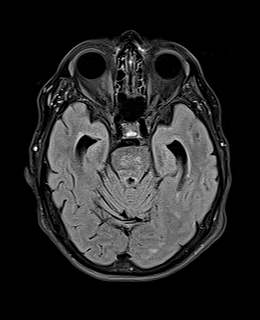
[im 35/52]
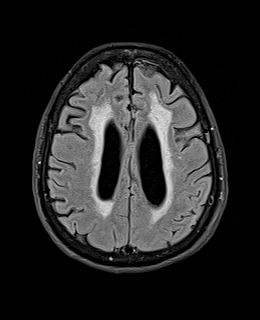
[im 52/52]
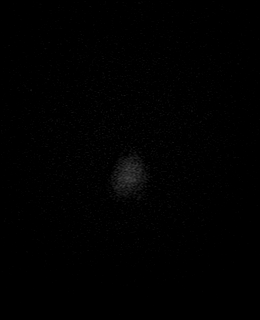

[Series 10: swi_images · axial · 3.0mm · 0.75mm/px · z∈[-61,+109]mm · 4 of 60 slices shown]
[im 1/60]
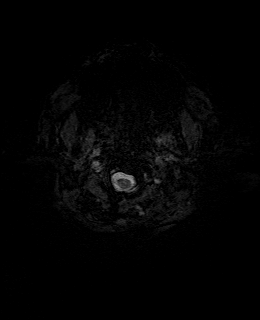
[im 20/60]
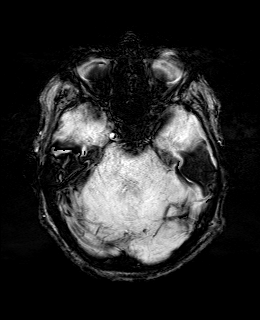
[im 40/60]
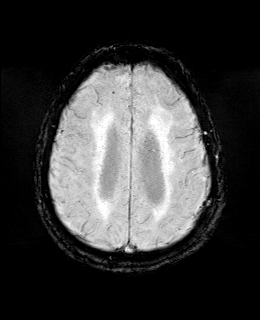
[im 60/60]
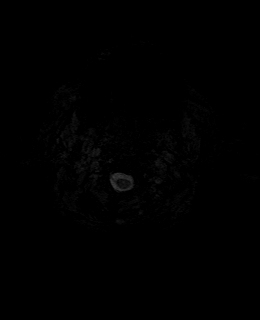

[Series 12: T1 · axial · 1.0mm · 0.94mm/px · z∈[-45,+93]mm · 10 of 144 slices shown (2 of 2)]
[im 1/144]
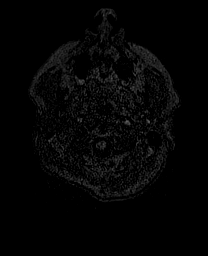
[im 16/144]
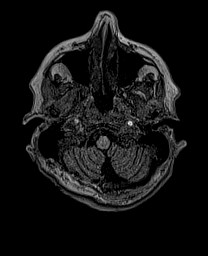
[im 32/144]
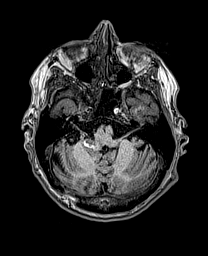
[im 48/144]
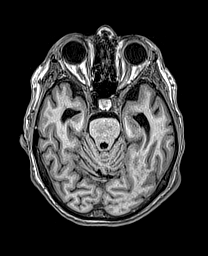
[im 64/144]
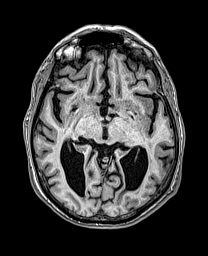
[im 80/144]
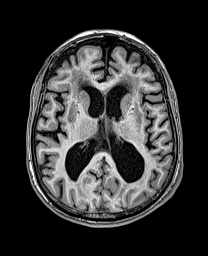
[im 96/144]
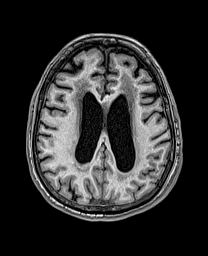
[im 112/144]
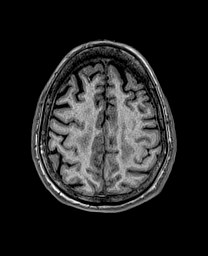
[im 128/144]
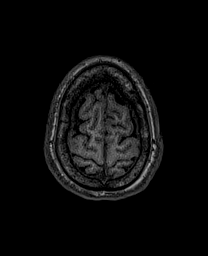
[im 144/144]
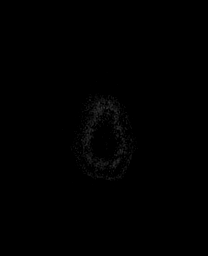

[Series 13: cor dwi_tracew · coronal · 5.0mm · 1.53mm/px · 4 of 52 slices shown]
[im 1/52]
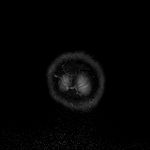
[im 18/52]
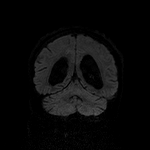
[im 35/52]
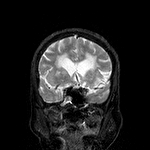
[im 52/52]
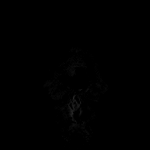

[Series 14: cor dwi_adc · coronal · 5.0mm · 1.53mm/px · 2 of 26 slices shown]
[im 1/26]
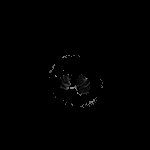
[im 26/26]
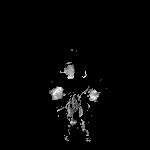

[Series 15: T2 · coronal · 5.0mm · 0.57mm/px · 2 of 34 slices shown (2 of 2)]
[im 1/34]
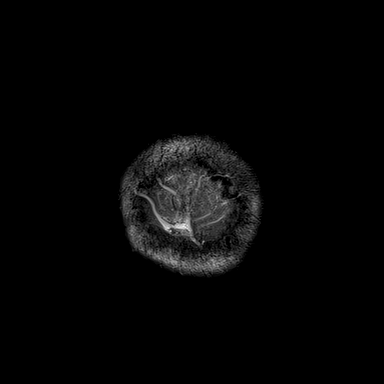
[im 34/34]
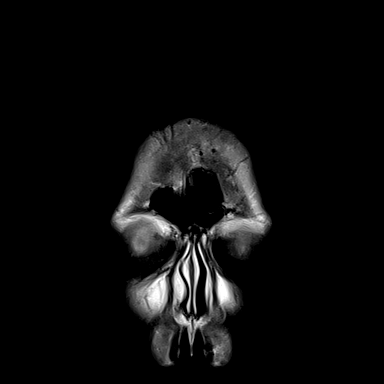

[37 of 48 positions shown; findings below may reference images not displayed]

FINDINGS: Brain: Diffuse prominence of the CSF containing spaces compatible
with generalized cerebral atrophy. Patchy and confluent T2/FLAIR
hyperintensity within the periventricular and deep white matter of
both cerebral hemispheres as well as the pons, most consistent with
chronic microvascular ischemic disease, moderate in nature.

There is a subtle 12 mm linear focus of diffusion abnormality
involving the right cerebellum (series 5, image 56) associated
T2/FLAIR signal abnormality without ADC correlate, suggesting a
small subacute ischemic infarct. This is new as compared to previous
MRI. No other diffusion abnormality to suggest acute or subacute
ischemia. Gray-white matter differentiation otherwise maintained. No
other areas of encephalomalacia to suggest chronic cortical
infarction. No findings to suggest acute intracranial hemorrhage.

No mass lesion, mass effect, or midline shift. Diffuse ventricular
prominence somewhat out of proportion to cortical sulcation, similar
to previous. No extra-axial fluid collection. Pituitary gland
suprasellar region normal. Midline structures intact.

Vascular: Partially thrombosed right PICA aneurysm again seen. The
aneurysm sac is little interval changed in overall size measuring 17
x 12 x 12 mm (previously 17 x 11 x 11 mm). 7 mm residual flow void
within the aneurysm sac better appreciated on prior CTA. Patent flow
void through the right the for pipeline stent. Hypoplastic left
vertebral artery noted. Major intracranial vascular flow voids
otherwise well maintained.

Skull and upper cervical spine: Craniocervical junction within
normal limits. Bone marrow signal intensity mildly heterogeneous but
within normal limits. Prior right suboccipital craniectomy noted. No
scalp soft tissue abnormality.

Sinuses/Orbits: Globes and orbital soft tissues demonstrate no acute
finding. Left maxillary sinus retention cyst. Scattered mucosal
thickening noted within the sphenoid ethmoidal sinuses. No mastoid
effusion. Inner ear structures grossly normal.

Other: None.
IMPRESSION: 1. 12 mm linear focus of diffusion abnormality involving the right
cerebellum, suggesting a small subacute ischemic infarct. This is
new as compared to previous MRI from 11/17/2019.
[DATE]. No other acute intracranial abnormality.
3. Partially thrombosed right PICA aneurysm, relatively stable in
size measuring up to 17 mm with residual 7 mm internal flow void.
4. Diffuse ventricular prominence somewhat out of proportion to
cortical sulcation, stable. While this finding may be related to
underlying atrophy, a component of normal pressure hydrocephalus
could also be considered in the correct clinical setting.
5. Underlying moderate chronic microvascular ischemic disease.
6. Prior right suboccipital craniectomy.

## 2022-11-30 IMAGING — MR MR HEAD W/ CM
4 series · 14 of 48 positions shown · IV contrast (6 ML GAD)
Comparison: None.

CLINICAL DATA: Seizure, abnormal neuro exam

EXAM:
MRI HEAD WITH CONTRAST
TECHNIQUE: Multiplanar, multiecho pulse sequences of the brain and surrounding
structures were obtained with intravenous contrast.
CONTRAST:  6mL GADAVIST GADOBUTROL 1 MMOL/ML IV SOLN

[Series 3: T1 · axial · 3.0mm · 0.94mm/px · z∈[-46,+57]mm · 3 of 52 slices shown (1 of 2)]
[im 10/52]
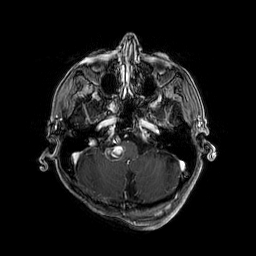
[im 28/52]
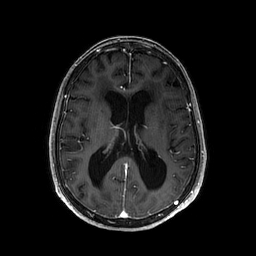
[im 46/52]
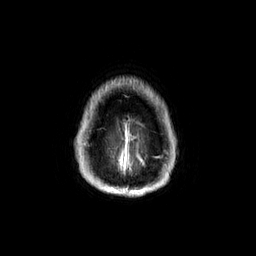

[Series 4: T1 · coronal · 5.0mm · 0.39mm/px · 3 of 31 slices shown (2 of 2)]
[im 4/31]
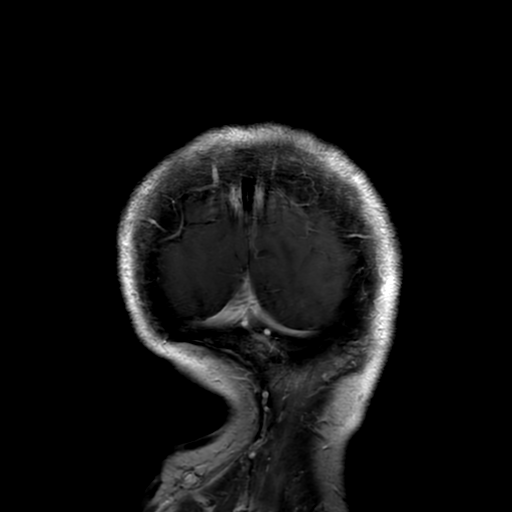
[im 16/31]
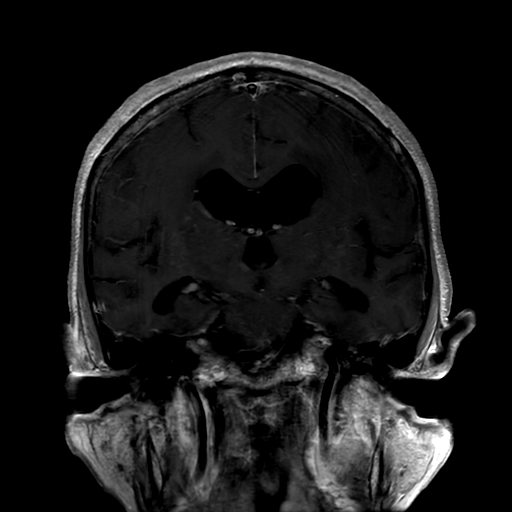
[im 28/31]
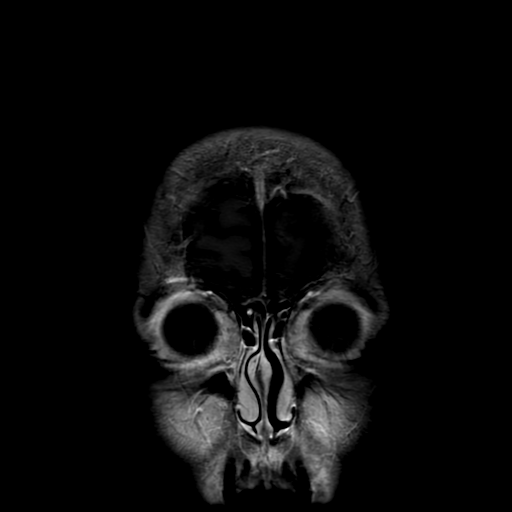

[Series 5: FLAIR post-contrast · sagittal · 5.0mm · 0.47mm/px · 5 of 24 slices shown]
[im 1/24]
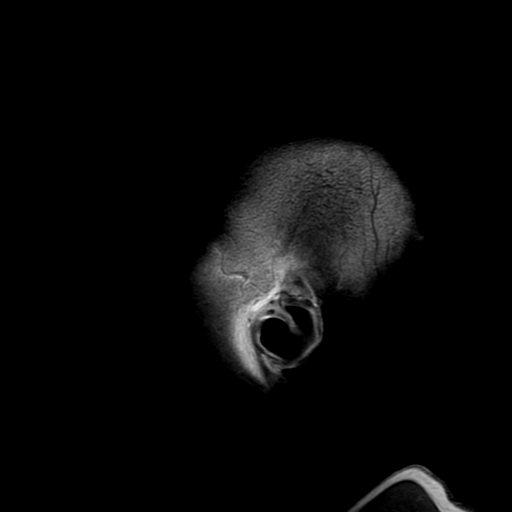
[im 4/24]
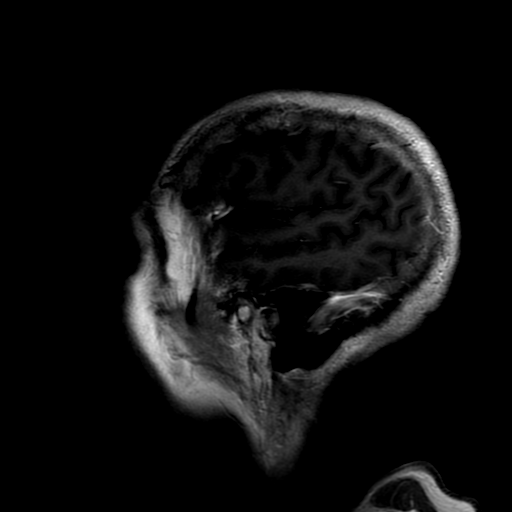
[im 7/24]
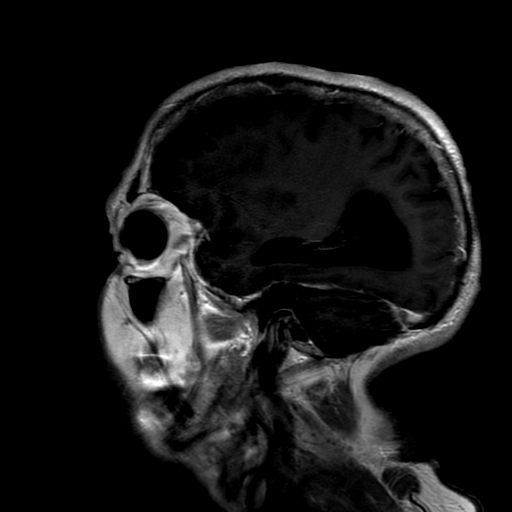
[im 14/24]
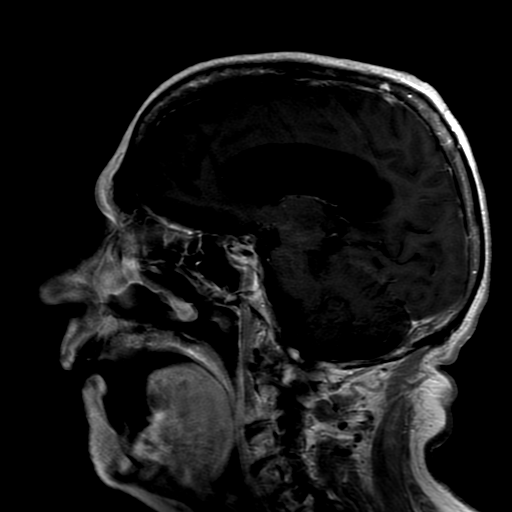
[im 20/24]
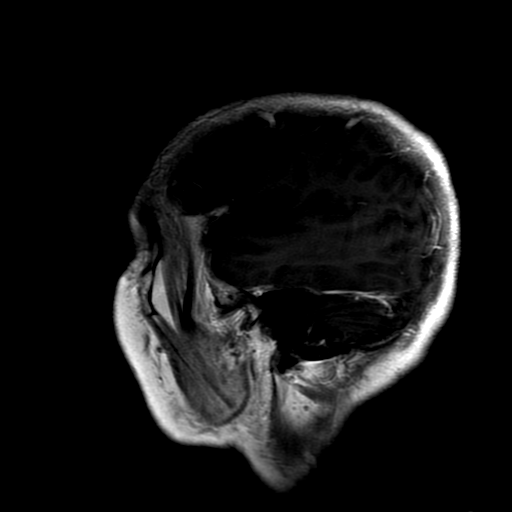

[Series 6: T2 post-contrast · coronal · 5.0mm · 0.20mm/px · 3 of 31 slices shown]
[im 4/31]
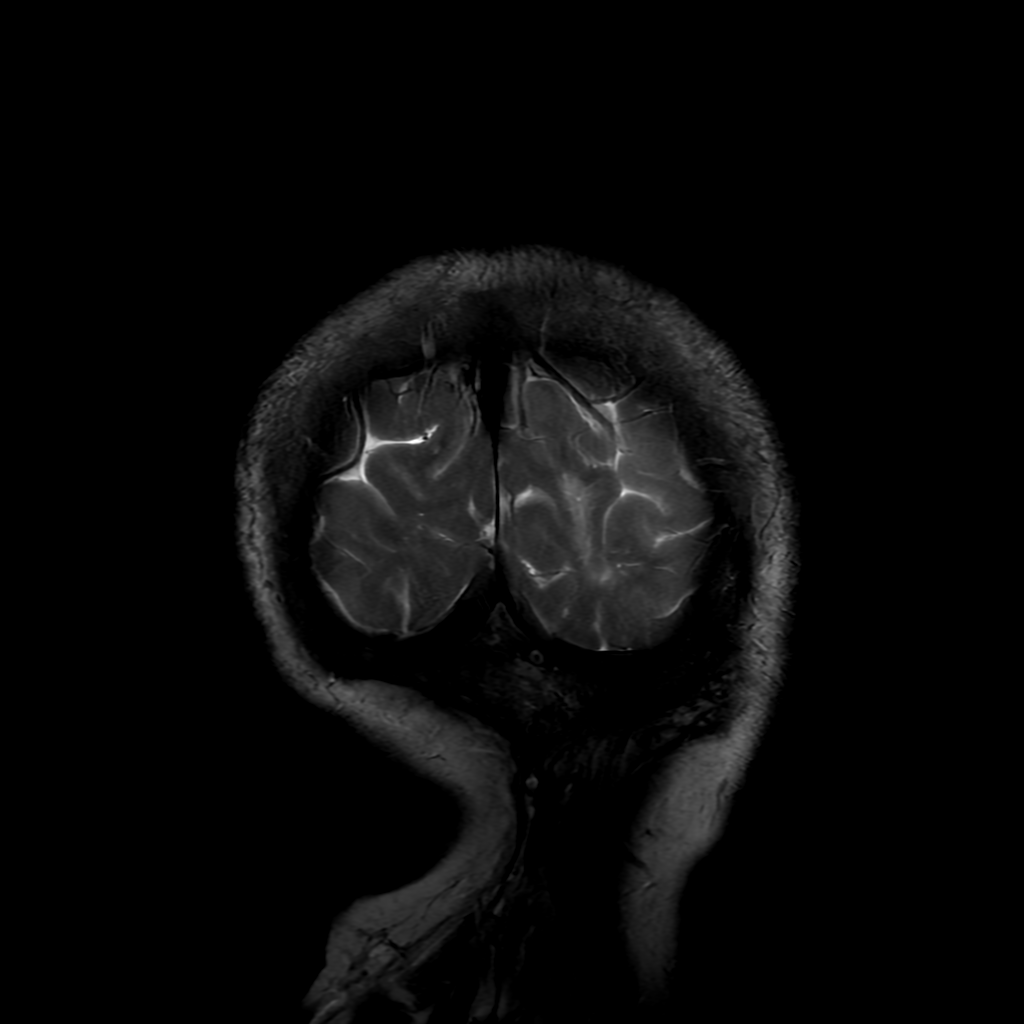
[im 16/31]
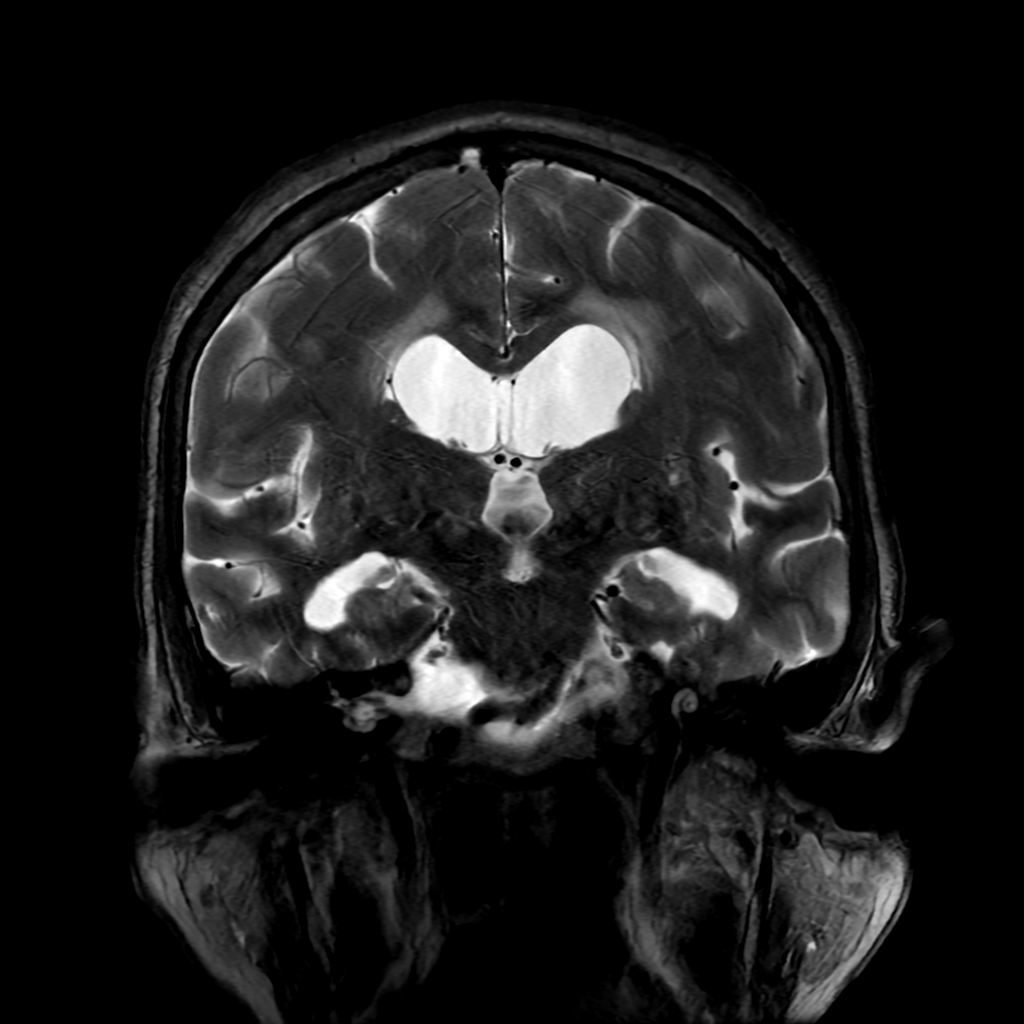
[im 28/31]
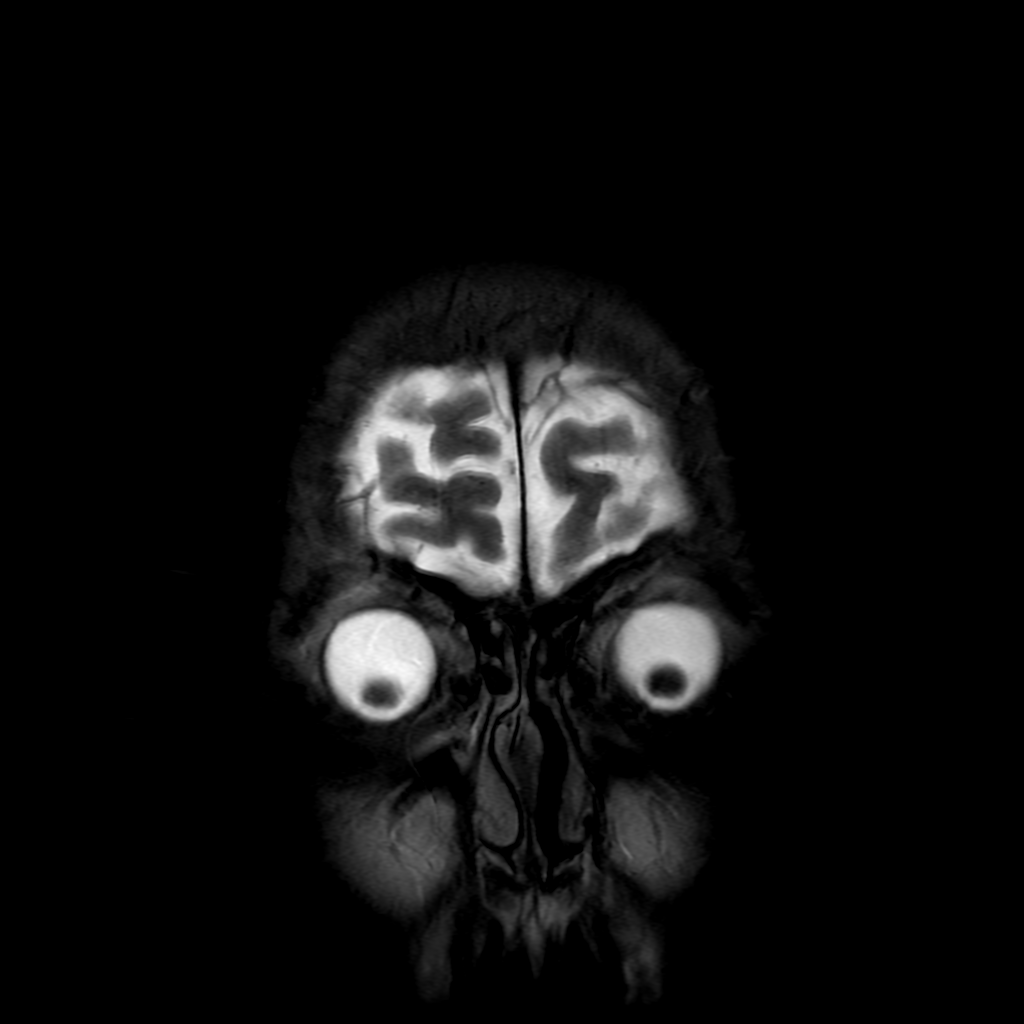

[14 of 48 positions shown; findings below may reference images not displayed]

FINDINGS: Brain: There is no abnormal enhancement. Stable prominence of the
ventricles disproportionate to the sulci.

Vascular: Partially thrombosed treated right PICA aneurysm is again
noted.

Skull and upper cervical spine: Unremarkable.

Sinuses/Orbits: No new finding.

Other: None.
IMPRESSION: No abnormal parenchymal or leptomeningeal enhancement.

Partially thrombosed treated right PICA aneurysm.

## 2022-12-17 ENCOUNTER — Ambulatory Visit: Payer: Medicare PPO | Admitting: Nurse Practitioner

## 2023-01-29 DIAGNOSIS — H2513 Age-related nuclear cataract, bilateral: Secondary | ICD-10-CM | POA: Diagnosis not present

## 2023-01-29 DIAGNOSIS — H5213 Myopia, bilateral: Secondary | ICD-10-CM | POA: Diagnosis not present

## 2023-02-03 DIAGNOSIS — R600 Localized edema: Secondary | ICD-10-CM | POA: Diagnosis not present

## 2023-02-03 DIAGNOSIS — E875 Hyperkalemia: Secondary | ICD-10-CM | POA: Diagnosis not present

## 2023-02-03 DIAGNOSIS — H9190 Unspecified hearing loss, unspecified ear: Secondary | ICD-10-CM | POA: Diagnosis not present

## 2023-02-03 DIAGNOSIS — M5410 Radiculopathy, site unspecified: Secondary | ICD-10-CM | POA: Diagnosis not present

## 2023-02-04 DIAGNOSIS — M949 Disorder of cartilage, unspecified: Secondary | ICD-10-CM | POA: Diagnosis not present

## 2023-02-04 DIAGNOSIS — M899 Disorder of bone, unspecified: Secondary | ICD-10-CM | POA: Diagnosis not present

## 2023-02-10 DIAGNOSIS — M4105 Infantile idiopathic scoliosis, thoracolumbar region: Secondary | ICD-10-CM | POA: Diagnosis not present

## 2023-02-10 DIAGNOSIS — R269 Unspecified abnormalities of gait and mobility: Secondary | ICD-10-CM | POA: Diagnosis not present

## 2023-02-12 DIAGNOSIS — Z131 Encounter for screening for diabetes mellitus: Secondary | ICD-10-CM | POA: Diagnosis not present

## 2023-02-12 DIAGNOSIS — E559 Vitamin D deficiency, unspecified: Secondary | ICD-10-CM | POA: Diagnosis not present

## 2023-02-12 DIAGNOSIS — Z9181 History of falling: Secondary | ICD-10-CM | POA: Diagnosis not present

## 2023-02-12 DIAGNOSIS — M25551 Pain in right hip: Secondary | ICD-10-CM | POA: Diagnosis not present

## 2023-02-12 DIAGNOSIS — Z79899 Other long term (current) drug therapy: Secondary | ICD-10-CM | POA: Diagnosis not present

## 2023-02-12 DIAGNOSIS — M546 Pain in thoracic spine: Secondary | ICD-10-CM | POA: Diagnosis not present

## 2023-02-12 DIAGNOSIS — M25552 Pain in left hip: Secondary | ICD-10-CM | POA: Diagnosis not present

## 2023-02-12 DIAGNOSIS — M545 Low back pain, unspecified: Secondary | ICD-10-CM | POA: Diagnosis not present

## 2023-02-12 DIAGNOSIS — Z1159 Encounter for screening for other viral diseases: Secondary | ICD-10-CM | POA: Diagnosis not present

## 2023-02-12 DIAGNOSIS — G8929 Other chronic pain: Secondary | ICD-10-CM | POA: Diagnosis not present

## 2023-02-12 DIAGNOSIS — M129 Arthropathy, unspecified: Secondary | ICD-10-CM | POA: Diagnosis not present

## 2023-02-17 DIAGNOSIS — Z9181 History of falling: Secondary | ICD-10-CM | POA: Diagnosis not present

## 2023-02-17 DIAGNOSIS — M4184 Other forms of scoliosis, thoracic region: Secondary | ICD-10-CM | POA: Diagnosis not present

## 2023-02-17 DIAGNOSIS — Z0000 Encounter for general adult medical examination without abnormal findings: Secondary | ICD-10-CM | POA: Diagnosis not present

## 2023-02-17 DIAGNOSIS — M5136 Other intervertebral disc degeneration, lumbar region: Secondary | ICD-10-CM | POA: Diagnosis not present

## 2023-02-17 DIAGNOSIS — E559 Vitamin D deficiency, unspecified: Secondary | ICD-10-CM | POA: Diagnosis not present

## 2023-02-17 DIAGNOSIS — R5383 Other fatigue: Secondary | ICD-10-CM | POA: Diagnosis not present

## 2023-02-17 DIAGNOSIS — M4156 Other secondary scoliosis, lumbar region: Secondary | ICD-10-CM | POA: Diagnosis not present

## 2023-02-17 DIAGNOSIS — M5134 Other intervertebral disc degeneration, thoracic region: Secondary | ICD-10-CM | POA: Diagnosis not present

## 2023-02-17 DIAGNOSIS — D649 Anemia, unspecified: Secondary | ICD-10-CM | POA: Diagnosis not present

## 2023-02-17 DIAGNOSIS — D539 Nutritional anemia, unspecified: Secondary | ICD-10-CM | POA: Diagnosis not present

## 2023-02-17 DIAGNOSIS — E7800 Pure hypercholesterolemia, unspecified: Secondary | ICD-10-CM | POA: Diagnosis not present

## 2023-02-26 ENCOUNTER — Telehealth: Payer: Self-pay | Admitting: Hematology and Oncology

## 2023-02-26 NOTE — Telephone Encounter (Signed)
scheduled per , pt has been called and confirmed date and time. Pt is aware of location and to arrive early for check in   

## 2023-03-04 ENCOUNTER — Inpatient Hospital Stay: Payer: HMO

## 2023-03-04 ENCOUNTER — Other Ambulatory Visit: Payer: Self-pay

## 2023-03-04 ENCOUNTER — Inpatient Hospital Stay: Payer: HMO | Attending: Hematology and Oncology | Admitting: Hematology and Oncology

## 2023-03-15 ENCOUNTER — Other Ambulatory Visit: Payer: Self-pay | Admitting: *Deleted

## 2023-03-15 DIAGNOSIS — M4156 Other secondary scoliosis, lumbar region: Secondary | ICD-10-CM | POA: Diagnosis not present

## 2023-03-15 DIAGNOSIS — M4184 Other forms of scoliosis, thoracic region: Secondary | ICD-10-CM | POA: Diagnosis not present

## 2023-03-15 DIAGNOSIS — D649 Anemia, unspecified: Secondary | ICD-10-CM | POA: Diagnosis not present

## 2023-03-15 DIAGNOSIS — M5136 Other intervertebral disc degeneration, lumbar region: Secondary | ICD-10-CM | POA: Diagnosis not present

## 2023-03-15 DIAGNOSIS — Z9181 History of falling: Secondary | ICD-10-CM | POA: Diagnosis not present

## 2023-03-15 DIAGNOSIS — Z6824 Body mass index (BMI) 24.0-24.9, adult: Secondary | ICD-10-CM | POA: Diagnosis not present

## 2023-03-15 DIAGNOSIS — E559 Vitamin D deficiency, unspecified: Secondary | ICD-10-CM | POA: Diagnosis not present

## 2023-03-15 DIAGNOSIS — M5134 Other intervertebral disc degeneration, thoracic region: Secondary | ICD-10-CM | POA: Diagnosis not present

## 2023-03-15 DIAGNOSIS — D509 Iron deficiency anemia, unspecified: Secondary | ICD-10-CM | POA: Diagnosis not present

## 2023-03-16 ENCOUNTER — Inpatient Hospital Stay (HOSPITAL_BASED_OUTPATIENT_CLINIC_OR_DEPARTMENT_OTHER): Payer: HMO | Admitting: Hematology and Oncology

## 2023-03-16 ENCOUNTER — Inpatient Hospital Stay: Payer: HMO

## 2023-03-16 ENCOUNTER — Other Ambulatory Visit: Payer: Self-pay

## 2023-03-16 ENCOUNTER — Inpatient Hospital Stay (HOSPITAL_COMMUNITY)
Admission: EM | Admit: 2023-03-16 | Discharge: 2023-03-18 | DRG: 379 | Disposition: A | Discharge status: Home / Self Care | Payer: HMO | Attending: Internal Medicine | Admitting: Internal Medicine

## 2023-03-16 ENCOUNTER — Ambulatory Visit: Payer: HMO

## 2023-03-16 ENCOUNTER — Encounter (HOSPITAL_COMMUNITY): Payer: Self-pay

## 2023-03-16 VITALS — BP 132/50 | HR 105 | Temp 97.9°F | Resp 18 | Wt 106.3 lb

## 2023-03-16 DIAGNOSIS — Z79899 Other long term (current) drug therapy: Secondary | ICD-10-CM | POA: Diagnosis not present

## 2023-03-16 DIAGNOSIS — K3189 Other diseases of stomach and duodenum: Secondary | ICD-10-CM | POA: Diagnosis present

## 2023-03-16 DIAGNOSIS — E785 Hyperlipidemia, unspecified: Secondary | ICD-10-CM | POA: Diagnosis present

## 2023-03-16 DIAGNOSIS — K921 Melena: Secondary | ICD-10-CM | POA: Diagnosis not present

## 2023-03-16 DIAGNOSIS — R5383 Other fatigue: Secondary | ICD-10-CM | POA: Diagnosis not present

## 2023-03-16 DIAGNOSIS — F418 Other specified anxiety disorders: Secondary | ICD-10-CM | POA: Diagnosis not present

## 2023-03-16 DIAGNOSIS — F32A Depression, unspecified: Secondary | ICD-10-CM | POA: Diagnosis not present

## 2023-03-16 DIAGNOSIS — Z882 Allergy status to sulfonamides status: Secondary | ICD-10-CM | POA: Diagnosis not present

## 2023-03-16 DIAGNOSIS — K254 Chronic or unspecified gastric ulcer with hemorrhage: Principal | ICD-10-CM | POA: Diagnosis present

## 2023-03-16 DIAGNOSIS — K2951 Unspecified chronic gastritis with bleeding: Secondary | ICD-10-CM | POA: Diagnosis not present

## 2023-03-16 DIAGNOSIS — D649 Anemia, unspecified: Secondary | ICD-10-CM

## 2023-03-16 DIAGNOSIS — Z87891 Personal history of nicotine dependence: Secondary | ICD-10-CM | POA: Diagnosis not present

## 2023-03-16 DIAGNOSIS — D509 Iron deficiency anemia, unspecified: Secondary | ICD-10-CM | POA: Diagnosis not present

## 2023-03-16 DIAGNOSIS — K259 Gastric ulcer, unspecified as acute or chronic, without hemorrhage or perforation: Secondary | ICD-10-CM | POA: Diagnosis not present

## 2023-03-16 DIAGNOSIS — K219 Gastro-esophageal reflux disease without esophagitis: Secondary | ICD-10-CM | POA: Diagnosis not present

## 2023-03-16 DIAGNOSIS — Z885 Allergy status to narcotic agent status: Secondary | ICD-10-CM | POA: Diagnosis not present

## 2023-03-16 DIAGNOSIS — I10 Essential (primary) hypertension: Secondary | ICD-10-CM | POA: Diagnosis present

## 2023-03-16 DIAGNOSIS — Z8673 Personal history of transient ischemic attack (TIA), and cerebral infarction without residual deficits: Secondary | ICD-10-CM

## 2023-03-16 DIAGNOSIS — M419 Scoliosis, unspecified: Secondary | ICD-10-CM | POA: Diagnosis not present

## 2023-03-16 DIAGNOSIS — Z85828 Personal history of other malignant neoplasm of skin: Secondary | ICD-10-CM

## 2023-03-16 DIAGNOSIS — Z85840 Personal history of malignant neoplasm of eye: Secondary | ICD-10-CM | POA: Diagnosis not present

## 2023-03-16 DIAGNOSIS — K449 Diaphragmatic hernia without obstruction or gangrene: Secondary | ICD-10-CM | POA: Diagnosis not present

## 2023-03-16 LAB — FERRITIN: Ferritin: 3 ng/mL — ABNORMAL LOW (ref 11–307)

## 2023-03-16 LAB — CBC WITH DIFFERENTIAL (CANCER CENTER ONLY)
Abs Immature Granulocytes: 0.02 10*3/uL (ref 0.00–0.07)
Basophils Absolute: 0 10*3/uL (ref 0.0–0.1)
Basophils Relative: 1 %
Eosinophils Absolute: 0.1 10*3/uL (ref 0.0–0.5)
Eosinophils Relative: 2 %
HCT: 21.8 % — ABNORMAL LOW (ref 36.0–46.0)
Hemoglobin: 6.1 g/dL — CL (ref 12.0–15.0)
Immature Granulocytes: 0 %
Lymphocytes Relative: 16 %
Lymphs Abs: 0.8 10*3/uL (ref 0.7–4.0)
MCH: 21 pg — ABNORMAL LOW (ref 26.0–34.0)
MCHC: 28 g/dL — ABNORMAL LOW (ref 30.0–36.0)
MCV: 74.9 fL — ABNORMAL LOW (ref 80.0–100.0)
Monocytes Absolute: 0.5 10*3/uL (ref 0.1–1.0)
Monocytes Relative: 11 %
Neutro Abs: 3.6 10*3/uL (ref 1.7–7.7)
Neutrophils Relative %: 70 %
Platelet Count: 355 10*3/uL (ref 150–400)
RBC: 2.91 MIL/uL — ABNORMAL LOW (ref 3.87–5.11)
RDW: 17.1 % — ABNORMAL HIGH (ref 11.5–15.5)
WBC Count: 5.1 10*3/uL (ref 4.0–10.5)
nRBC: 0 % (ref 0.0–0.2)

## 2023-03-16 LAB — CBC WITH DIFFERENTIAL/PLATELET
Abs Immature Granulocytes: 0.01 10*3/uL (ref 0.00–0.07)
Basophils Absolute: 0 10*3/uL (ref 0.0–0.1)
Basophils Relative: 1 %
Eosinophils Absolute: 0.1 10*3/uL (ref 0.0–0.5)
Eosinophils Relative: 2 %
HCT: 22.6 % — ABNORMAL LOW (ref 36.0–46.0)
Hemoglobin: 6.1 g/dL — CL (ref 12.0–15.0)
Immature Granulocytes: 0 %
Lymphocytes Relative: 21 %
Lymphs Abs: 1.1 10*3/uL (ref 0.7–4.0)
MCH: 20.5 pg — ABNORMAL LOW (ref 26.0–34.0)
MCHC: 27 g/dL — ABNORMAL LOW (ref 30.0–36.0)
MCV: 76.1 fL — ABNORMAL LOW (ref 80.0–100.0)
Monocytes Absolute: 0.5 10*3/uL (ref 0.1–1.0)
Monocytes Relative: 10 %
Neutro Abs: 3.3 10*3/uL (ref 1.7–7.7)
Neutrophils Relative %: 66 %
Platelets: 347 10*3/uL (ref 150–400)
RBC: 2.97 MIL/uL — ABNORMAL LOW (ref 3.87–5.11)
RDW: 17.4 % — ABNORMAL HIGH (ref 11.5–15.5)
WBC: 5 10*3/uL (ref 4.0–10.5)
nRBC: 0 % (ref 0.0–0.2)

## 2023-03-16 LAB — CMP (CANCER CENTER ONLY)
ALT: 8 U/L (ref 0–44)
AST: 13 U/L — ABNORMAL LOW (ref 15–41)
Albumin: 3.7 g/dL (ref 3.5–5.0)
Alkaline Phosphatase: 106 U/L (ref 38–126)
Anion gap: 7 (ref 5–15)
BUN: 13 mg/dL (ref 8–23)
CO2: 24 mmol/L (ref 22–32)
Calcium: 9.2 mg/dL (ref 8.9–10.3)
Chloride: 107 mmol/L (ref 98–111)
Creatinine: 0.69 mg/dL (ref 0.44–1.00)
GFR, Estimated: >60 mL/min (ref >60)
Glucose, Bld: 110 mg/dL — ABNORMAL HIGH (ref 70–99)
Potassium: 3.9 mmol/L (ref 3.5–5.1)
Sodium: 138 mmol/L (ref 135–145)
Total Bilirubin: 0.3 mg/dL (ref 0.3–1.2)
Total Protein: 6.1 g/dL — ABNORMAL LOW (ref 6.5–8.1)

## 2023-03-16 LAB — IRON AND IRON BINDING CAPACITY (CC-WL,HP ONLY)
Iron: 10 ug/dL — ABNORMAL LOW (ref 28–170)
Saturation Ratios: 2 % — ABNORMAL LOW (ref 10.4–31.8)
TIBC: 519 ug/dL — ABNORMAL HIGH (ref 250–450)
UIBC: 509 ug/dL — ABNORMAL HIGH (ref 148–442)

## 2023-03-16 LAB — BASIC METABOLIC PANEL
Anion gap: 7 (ref 5–15)
BUN: 14 mg/dL (ref 8–23)
CO2: 22 mmol/L (ref 22–32)
Calcium: 9 mg/dL (ref 8.9–10.3)
Chloride: 108 mmol/L (ref 98–111)
Creatinine, Ser: 0.74 mg/dL (ref 0.44–1.00)
GFR, Estimated: >60 mL/min (ref >60)
Glucose, Bld: 96 mg/dL (ref 70–99)
Potassium: 3.9 mmol/L (ref 3.5–5.1)
Sodium: 137 mmol/L (ref 135–145)

## 2023-03-16 LAB — PREPARE RBC (CROSSMATCH)

## 2023-03-16 LAB — BPAM RBC

## 2023-03-16 MED ORDER — ACETAMINOPHEN 325 MG PO TABS: 650.0000 mg | ORAL_TABLET | Freq: Four times a day (QID) | ORAL | Status: DC | PRN

## 2023-03-16 MED ORDER — ONDANSETRON HCL 4 MG/2ML IJ SOLN
4.0000 mg | Freq: Four times a day (QID) | INTRAMUSCULAR | Status: DC | PRN
  Administered 2023-03-17: 4 mg via INTRAVENOUS

## 2023-03-16 MED ORDER — PANTOPRAZOLE SODIUM 40 MG IV SOLR: 40.0000 mg | INTRAVENOUS | Status: DC

## 2023-03-16 MED ORDER — MIRTAZAPINE 15 MG PO TABS
7.5000 mg | ORAL_TABLET | Freq: Every day | ORAL | Status: DC
  Administered 2023-03-16 – 2023-03-17 (×2): 7.5 mg via ORAL
  Filled 2023-03-16 (×2): qty 1

## 2023-03-16 MED ORDER — ONDANSETRON HCL 4 MG PO TABS: 4.0000 mg | ORAL_TABLET | Freq: Four times a day (QID) | ORAL | Status: DC | PRN

## 2023-03-16 MED ORDER — ACETAMINOPHEN 650 MG RE SUPP: 650.0000 mg | Freq: Four times a day (QID) | RECTAL | Status: DC | PRN

## 2023-03-16 MED ORDER — ALBUTEROL SULFATE (2.5 MG/3ML) 0.083% IN NEBU: 2.5000 mg | INHALATION_SOLUTION | RESPIRATORY_TRACT | Status: DC | PRN

## 2023-03-16 MED ORDER — TRAZODONE HCL 50 MG PO TABS
25.0000 mg | ORAL_TABLET | Freq: Every evening | ORAL | Status: DC | PRN
  Administered 2023-03-17: 25 mg via ORAL
  Filled 2023-03-16: qty 1

## 2023-03-16 MED ORDER — PANTOPRAZOLE SODIUM 40 MG IV SOLR
40.0000 mg | Freq: Once | INTRAVENOUS | Status: AC
  Administered 2023-03-16: 40 mg via INTRAVENOUS
  Filled 2023-03-16: qty 10

## 2023-03-16 MED ORDER — SODIUM CHLORIDE 0.9% IV SOLUTION: Freq: Once | INTRAVENOUS | Status: AC

## 2023-03-16 MED ORDER — ATORVASTATIN CALCIUM 40 MG PO TABS
40.0000 mg | ORAL_TABLET | Freq: Every day | ORAL | Status: DC
  Filled 2023-03-16: qty 1

## 2023-03-16 NOTE — Consult Note (Signed)
Reason for Consult: Symptomatic anemia Referring Physician: [facility]  Elaine Rivera HPI: This is a 77 year old female with a PMH of IDA and a probable lesion in her small bowel admitted for symptomatic anemia.  She was worked up last year in 10/2021 with an EGD,  VCE and an enteroscopy.  The AVM with the VCE was not located with the follow up enteroscope.  With routine testing and intermittent iron infusions she was noted to have an anemia of 6.1 g/dL, which was a drop down from 13.7 g/dL (07/02/2022).  She reported feeling fatigued and there was an element of orthostasis.  The patient denies any melena or hematochezia.  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   noted on EKG   Scoliosis    Seizures (HCC)    Manifesting with aphasia   Severe protein-calorie malnutrition (HCC)    Stroke (HCC)     Past Surgical History:  Procedure Laterality Date   CEREBRAL ANEURYSM REPAIR     1998   COLONOSCOPY     ECTOPIC PREGNANCY SURGERY     x2    ENTEROSCOPY N/A 03/01/2020   Procedure: ENTEROSCOPY;  Surgeon: Deone Omahoney, MD;  Location: [facility];  Service: Endoscopy;  Laterality: N/A;   ENTEROSCOPY N/A 11/14/2021   Procedure: ENTEROSCOPY;  Surgeon: Dusty Wagoner, MD;  Location: [facility];  Service: Endoscopy;  Laterality: N/A;   ESOPHAGOGASTRODUODENOSCOPY N/A 04/22/2016   Procedure:  ESOPHAGOGASTRODUODENOSCOPY (EGD);  Surgeon: Jyothi Mann, MD;  Location: [facility];  Service: Endoscopy;  Laterality: N/A;   ESOPHAGOGASTRODUODENOSCOPY (EGD) WITH PROPOFOL N/A 11/12/2021   Procedure: ESOPHAGOGASTRODUODENOSCOPY (EGD) WITH PROPOFOL;  Surgeon: Jetty Berland, MD;  Location: [facility];  Service: Endoscopy;  Laterality: N/A;   FEMORAL HERNIA REPAIR Right 07/02/2022   Procedure: Laparoscopic right femoral hernia with mesh;  Surgeon: Ramirez, Armando, MD;  Location: [facility];  Service: General;  Laterality: Right;   GIVENS CAPSULE STUDY N/A 11/12/2021   Procedure: GIVENS CAPSULE STUDY;  Surgeon: Shantera Monts, MD;  Location: [facility];  Service: Endoscopy;  Laterality: N/A;   IR 3D INDEPENDENT WKST  09/05/2018   IR ANGIO INTRA EXTRACRAN SEL COM CAROTID INNOMINATE BILAT MOD SED  09/05/2018   IR ANGIO VERTEBRAL SEL VERTEBRAL UNI R MOD SED  09/05/2018   IR ANGIOGRAM FOLLOW UP STUDY  09/05/2018   IR RADIOLOGIST EVAL & MGMT  03/28/2021   IR TRANSCATH/EMBOLIZ  09/05/2018   PARATHYROIDECTOMY     2002   RADIOLOGY WITH ANESTHESIA N/A 09/05/2018   Procedure: EMBOLIZATION;  Surgeon: Radiologist, Medication, MD;  Location: [facility];  Service: Radiology;  Laterality: N/A;   TONSILLECTOMY     1953   VAGINAL HYSTERECTOMY       2001   VOCAL CORD LATERALIZATION, ENDOSCOPIC APPROACH W/ MLB     2007 (@[facility])   WRIST SURGERY     left side/2008   WRIST SURGERY     right side/ 2002    Family History  Problem Relation Age of Onset   Cancer Mother    Suicidality Father     Social History:  reports that she has quit smoking. Her smoking use included cigarettes. She has never used smokeless tobacco. She reports that she does not drink alcohol and does not use drugs.  Allergies:  Allergies  Allergen Reactions   Sulfa Antibiotics Other (See Comments)    Reaction:  Unknown    Tramadol Other (See Comments)    seizures    Medications: Scheduled:  atorvastatin  40 mg Oral Daily   mirtazapine  7.5 mg  Oral QHS   [START ON 03/17/2023] pantoprazole (PROTONIX) IV  40 mg Intravenous Q24H   Continuous:  Results for orders placed or performed during the hospital encounter of 03/16/23 (from the past 24 hour(s))  Basic metabolic panel     Status: None   Collection Time: 03/16/23  4:57 PM  Result Value Ref Range   Sodium 137 135 - 145 mmol/L   Potassium 3.9 3.5 - 5.1 mmol/L   Chloride 108 98 - 111 mmol/L   CO2 22 22 - 32 mmol/L   Glucose, Bld 96 70 - 99 mg/dL   BUN 14 8 - 23 mg/dL   Creatinine, Ser 0.74 0.44 - 1.00 mg/dL   Calcium 9.0 8.9 - 10.3 mg/dL   GFR, Estimated >60 >60 mL/min   Anion gap 7 5 - 15  CBC with Differential     Status: Abnormal   Collection Time: 03/16/23  4:57 PM  Result Value Ref Range   WBC 5.0 4.0 - 10.5 K/uL   RBC 2.97 (L) 3.87 - 5.11 MIL/uL   Hemoglobin 6.1 (LL) 12.0 - 15.0 g/dL   HCT 22.6 (L) 36.0 - 46.0 %   MCV 76.1 (L) 80.0 - 100.0 fL   MCH 20.5 (L) 26.0 - 34.0 pg   MCHC 27.0 (L) 30.0 - 36.0 g/dL   RDW 17.4 (H) 11.5 - 15.5 %   Platelets 347 150 - 400 K/uL   nRBC 0.0 0.0 - 0.2 %   Neutrophils Relative % 66 %   Neutro Abs 3.3 1.7 - 7.7 K/uL   Lymphocytes Relative 21 %   Lymphs Abs 1.1 0.7 - 4.0 K/uL   Monocytes Relative 10 %   Monocytes Absolute 0.5 0.1 - 1.0 K/uL   Eosinophils Relative 2 %   Eosinophils Absolute 0.1 0.0 - 0.5 K/uL   Basophils Relative 1 %   Basophils Absolute 0.0 0.0 - 0.1 K/uL   Immature Granulocytes 0 %   Abs Immature Granulocytes 0.01 0.00 - 0.07 K/uL  Type and screen [facility]     Status: None (Preliminary result)   Collection Time: 03/16/23  4:57 PM  Result Value Ref Range   ABO/RH(D) O POS    Antibody Screen NEG    Sample Expiration 03/19/2023,2359    Unit Number [id number]    Blood Component Type RED CELLS,LR    Unit division 00    Status of Unit ISSUED    Transfusion Status OK TO TRANSFUSE    Crossmatch Result      Compatible Performed at [facility], [address]  [place], [place] [address]   Prepare RBC (crossmatch)       Status: None   Collection Time: 03/16/23  4:57 PM  Result Value Ref Range   Order Confirmation      ORDER PROCESSED BY BLOOD BANK Performed at  [facility], [address] [place], [place] [address]      No results found.  ROS:  As stated above in the HPI otherwise negative.  Blood pressure (!) 156/83, pulse 82, temperature 98 F (36.7 C), temperature source Oral, resp. rate 13, height 4' 11" (1.499 m), weight 48.2 kg, SpO2 100 %.    PE: Gen: NAD, Alert and Oriented HEENT:  [place]/AT, EOMI Neck: Supple, no LAD Lungs: CTA Bilaterally CV: RRR without M/G/R ABD: Soft, NTND, +BS Ext: No C/C/E  Assessment/Plan: 1) Symptomatic anemia. 2) History of small bowel AVM. 3) Orthostasis, 4) SOB.   She is hemodynamically stable.  Her symptoms started three months ago and it progressively worsened.  A repeat enteroscopy will be performed.  Plan: 1) Enteroscopy. 2) Agree with blood transfusions. 3) Follow [facility].  Dymir Neeson D 03/16/2023, 6:32 PM     

## 2023-03-16 NOTE — ED Provider Notes (Signed)
[place] [facility]PART[facility] AT [facility] Provider Note   CSN: [id number] Arrival date & time: 03/16/23  1517     History  Chief Complaint  Patient presents with   Low hemoglobin    Elaine Rivera is a 77 y.o. female.  78 yo F with a chief complaints of a low blood level.  She is not really sure why she is here.  She went to see somebody about getting an iron infusion today and while she was there they checked her blood work and told her that her hemoglobin was low and sent her here for evaluation.  When asked if she has been having dark stools or blood in her stool she says she does not know.  She denies any abdominal pain.  Has had some shortness of breath off and on recently.        Home Medications Prior to Admission medications   Medication Sig Start Date End Date Taking? Authorizing Provider  acetaminophen (TYLENOL) 500 MG tablet Take 500-1,000 mg by mouth every 6 (six) hours as needed for moderate pain or mild pain.    [provider]  atorvastatin (LIPITOR) 40 MG tablet Take 40 mg by mouth daily.    [provider]  Camphor-Menthol-Methyl Sal (SALONPAS) 3.10-03-08 % PTCH Apply 1 patch topically daily.    [provider]  folic acid (FOLVITE) 1 MG tablet Take 1 tablet (1 mg total) by mouth daily. 11/15/21   Albertine Grates, MD  mirtazapine (REMERON) 7.5 MG tablet Take 1 tablet (7.5 mg total) by mouth at bedtime. 08/26/21   Willeen Niece, MD  oxyCODONE-acetaminophen (PERCOCET) 5-325 MG tablet Take 1 tablet by mouth every 6 (six) hours as needed for severe pain. 07/02/22 07/02/23  Axel Filler, MD  pantoprazole (PROTONIX) 40 MG tablet Take 1 tablet (40 mg total) by mouth daily. 11/14/21 11/14/22  Albertine Grates, MD  potassium chloride SA (KLOR-CON M) 20 MEQ tablet Take 2 tablets (40 mEq total) by mouth daily. Patient taking differently: Take 40 mEq by mouth 2 (two) times daily. 06/03/22   Axel Filler, MD  thiamine 100 MG tablet Take 1 tablet  (100 mg total) by mouth daily. 11/14/21   Albertine Grates, MD      Allergies    Sulfa antibiotics and Tramadol    Review of Systems   Review of Systems  Physical Exam Updated Vital Signs BP (!) 105/57 (BP Location: Right Arm)   Pulse 67   Temp 98.4 F (36.9 C) (Oral)   Resp 18   Ht 4\' 11"  (1.499 m)   Wt 48.2 kg   SpO2 99%   BMI 21.46 kg/m  Physical Exam Vitals and nursing note reviewed.  Constitutional:      General: She is not in acute distress.    Appearance: She is well-developed. She is not diaphoretic.  HENT:     Head: Normocephalic and atraumatic.  Eyes:     Pupils: Pupils are equal, round, and reactive to light.  Cardiovascular:     Rate and Rhythm: Normal rate and regular rhythm.     Heart sounds: Normal heart sounds. No murmur heard.    No friction rub. No gallop.  Pulmonary:     Effort: Pulmonary effort is normal.     Breath sounds: No wheezing or rales.  Abdominal:     General: There is no distension.     Palpations: Abdomen is soft.     Tenderness: There is no abdominal tenderness.  Genitourinary:  Comments: Soft brown stools Musculoskeletal:        General: No tenderness.     Cervical back: Normal range of motion and neck supple.  Skin:    General: Skin is warm and dry.  Neurological:     Mental Status: She is alert and oriented to person, place, and time.  Psychiatric:        Behavior: Behavior normal.     ED Results / Procedures / Treatments   Labs (all labs ordered are listed, but only abnormal results are displayed) Labs Reviewed  CBC WITH DIFFERENTIAL/PLATELET - Abnormal; Notable for the following components:      Result Value   RBC 2.97 (*)    Hemoglobin 6.1 (*)    HCT 22.6 (*)    MCV 76.1 (*)    MCH 20.5 (*)    MCHC 27.0 (*)    RDW 17.4 (*)    All other components within normal limits  BASIC METABOLIC PANEL  TYPE AND SCREEN  PREPARE RBC (CROSSMATCH)    EKG None  Radiology No results found.  Procedures .Critical  Care  Performed by: Melene Plan, DO Authorized by: Melene Plan, DO   Critical care provider statement:    Critical care time (minutes):  35   Critical care time was exclusive of:  Separately billable procedures and treating other patients   Critical care was time spent personally by me on the following activities:  Development of treatment plan with patient or surrogate, discussions with consultants, evaluation of patient's response to treatment, examination of patient, ordering and review of laboratory studies, ordering and review of radiographic studies, ordering and performing treatments and interventions, pulse oximetry, re-evaluation of patient's condition and review of old charts   Care discussed with: admitting provider       Medications Ordered in ED Medications  0.9 %  sodium chloride infusion (Manually program via Guardrails IV Fluids) (has no administration in time range)  pantoprazole (PROTONIX) injection 40 mg (has no administration in time range)    ED Course/ Medical Decision Making/ A&P                             Medical Decision Making Amount and/or Complexity of Data Reviewed Labs: ordered.  Risk Prescription drug management.   77 yo F with a chief complaints of a low hemoglobin.  This was noted at her hematologist visit today when she had arrived to get a iron infusion.  Hemoglobin was 6.1.  She was sent here for transfusion.  On record review the patient had an admission for an upper GI bleed.  This was about a year and a half ago.  I reviewed those records and at that time she also was unsure if she had been having dark stools or not.  Her stools here are soft and brown.  Not sure if maybe the bleeding had resolved.  Will order a unit of blood here.  Discussed with medicine.  The patients results and plan were reviewed and discussed.   Any x-rays performed were independently reviewed by myself.   Differential diagnosis were considered with the presenting  HPI.  Medications  0.9 %  sodium chloride infusion (Manually program via Guardrails IV Fluids) (has no administration in time range)  pantoprazole (PROTONIX) injection 40 mg (has no administration in time range)    Vitals:   03/16/23 1530 03/16/23 1631  BP:  (!) 105/57  Pulse:  67  Resp:  18  Temp:  98.4 F (36.9 C)  TempSrc:  Oral  SpO2:  99%  Weight: 48.2 kg   Height: 4\' 11"  (1.499 m)     Final diagnoses:  Symptomatic anemia    Admission/ observation were discussed with the admitting physician, patient and/or family and they are comfortable with the plan.          Final Clinical Impression(s) / ED Diagnoses Final diagnoses:  Symptomatic anemia    Rx / DC Orders ED Discharge Orders     None         Melene Plan, DO 03/16/23 1737

## 2023-03-16 NOTE — H&P (View-Only) (Signed)
Reason for Consult: Symptomatic anemia Referring Physician: [facility]  Elaine Rivera HPI: This is a 77 year old female with a PMH of IDA and a probable lesion in her small bowel admitted for symptomatic anemia.  She was worked up last year in 10/2021 with an EGD,  VCE and an enteroscopy.  The AVM with the VCE was not located with the follow up enteroscope.  With routine testing and intermittent iron infusions she was noted to have an anemia of 6.1 g/dL, which was a drop down from 13.7 g/dL (29/01/6212).  She reported feeling fatigued and there was an element of orthostasis.  The patient denies any melena or hematochezia.  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   noted on EKG   Scoliosis    Seizures (HCC)    Manifesting with aphasia   Severe protein-calorie malnutrition (HCC)    Stroke [facility])     Past Surgical History:  Procedure Laterality Date   CEREBRAL ANEURYSM REPAIR     1998   COLONOSCOPY     ECTOPIC PREGNANCY SURGERY     x2    ENTEROSCOPY N/A 03/01/2020   Procedure: ENTEROSCOPY;  Surgeon: Jeani Hawking, MD;  [facility]: [facility];  Service: Endoscopy;  Laterality: N/A;   ENTEROSCOPY N/A 11/14/2021   Procedure: ENTEROSCOPY;  Surgeon: Jeani Hawking, MD;  [facility];  Service: Endoscopy;  Laterality: N/A;   ESOPHAGOGASTRODUODENOSCOPY N/A 04/22/2016   Procedure:  ESOPHAGOGASTRODUODENOSCOPY (EGD);  Surgeon: Charna Elizabeth, MD;  [facility];  Service: Endoscopy;  Laterality: N/A;   ESOPHAGOGASTRODUODENOSCOPY (EGD) WITH PROPOFOL N/A 11/12/2021   Procedure: ESOPHAGOGASTRODUODENOSCOPY (EGD) WITH PROPOFOL;  Surgeon: Jeani Hawking, MD;  [facility];  Service: Endoscopy;  Laterality: N/A;   FEMORAL HERNIA REPAIR Right 07/02/2022   Procedure: Laparoscopic right femoral hernia with mesh;  Surgeon: Axel Filler, MD;  [facility]: [facility];  Service: General;  Laterality: Right;   GIVENS CAPSULE STUDY N/A 11/12/2021   Procedure: GIVENS CAPSULE STUDY;  Surgeon: Jeani Hawking, MD;  [facility];  Service: Endoscopy;  Laterality: N/A;   IR 3D INDEPENDENT WKST  09/05/2018   IR ANGIO INTRA EXTRACRAN SEL COM CAROTID INNOMINATE BILAT MOD SED  09/05/2018   IR ANGIO VERTEBRAL SEL VERTEBRAL UNI R MOD SED  09/05/2018   IR ANGIOGRAM FOLLOW UP STUDY  09/05/2018   IR RADIOLOGIST EVAL & MGMT  03/28/2021   IR TRANSCATH/EMBOLIZ  09/05/2018   PARATHYROIDECTOMY     2002   RADIOLOGY WITH ANESTHESIA N/A 09/05/2018   Procedure: EMBOLIZATION;  Surgeon: Radiologist, Medication, MD;  [facility]: [facility];  Service: Radiology;  Laterality: N/A;   TONSILLECTOMY     1953   VAGINAL HYSTERECTOMY  2001   VOCAL CORD LATERALIZATION, ENDOSCOPIC APPROACH W/ MLB     2007 (@[facility] )   WRIST SURGERY     left side/2008   WRIST SURGERY     right side/ 2002    Family History  Problem Relation Age of Onset   Cancer Mother    Suicidality Father     Social History:  reports that she has quit smoking. Her smoking use included cigarettes. She has never used smokeless tobacco. She reports that she does not drink alcohol and does not use drugs.  Allergies:  Allergies  Allergen Reactions   Sulfa Antibiotics Other (See Comments)    Reaction:  Unknown    Tramadol Other (See Comments)    seizures    Medications: Scheduled:  atorvastatin  40 mg Oral Daily   mirtazapine  7.5 mg  Oral QHS   [START ON 03/17/2023] pantoprazole (PROTONIX) IV  40 mg Intravenous Q24H   Continuous:  Results for orders placed or performed during the hospital encounter of 03/16/23 (from the past 24 hour(s))  Basic metabolic panel     Status: None   Collection Time: 03/16/23  4:57 PM  Result Value Ref Range   Sodium 137 135 - 145 mmol/L   Potassium 3.9 3.5 - 5.1 mmol/L   Chloride 108 98 - 111 mmol/L   CO2 22 22 - 32 mmol/L   Glucose, Bld 96 70 - 99 mg/dL   BUN 14 8 - 23 mg/dL   Creatinine, Ser 8.29 0.44 - 1.00 mg/dL   Calcium 9.0 8.9 - 56.2 mg/dL   GFR, Estimated >13 >08 mL/min   Anion gap 7 5 - 15  CBC with Differential     Status: Abnormal   Collection Time: 03/16/23  4:57 PM  Result Value Ref Range   WBC 5.0 4.0 - 10.5 K/uL   RBC 2.97 (L) 3.87 - 5.11 MIL/uL   Hemoglobin 6.1 (LL) 12.0 - 15.0 g/dL   HCT 65.7 (L) 84.6 - 96.2 %   MCV 76.1 (L) 80.0 - 100.0 fL   MCH 20.5 (L) 26.0 - 34.0 pg   MCHC 27.0 (L) 30.0 - 36.0 g/dL   RDW 95.2 (H) 84.1 - 32.4 %   Platelets 347 150 - 400 K/uL   nRBC 0.0 0.0 - 0.2 %   Neutrophils Relative % 66 %   Neutro Abs 3.3 1.7 - 7.7 K/uL   Lymphocytes Relative 21 %   Lymphs Abs 1.1 0.7 - 4.0 K/uL   Monocytes Relative 10 %   Monocytes Absolute 0.5 0.1 - 1.0 K/uL   Eosinophils Relative 2 %   Eosinophils Absolute 0.1 0.0 - 0.5 K/uL   Basophils Relative 1 %   Basophils Absolute 0.0 0.0 - 0.1 K/uL   Immature Granulocytes 0 %   Abs Immature Granulocytes 0.01 0.00 - 0.07 K/uL  Type and screen [facility]     Status: None (Preliminary result)   Collection Time: 03/16/23  4:57 PM  Result Value Ref Range   ABO/RH(D) O POS    Antibody Screen NEG    Sample Expiration 03/19/2023,2359    Unit Number [id number]    Blood Component Type RED CELLS,LR    Unit division 00    Status of Unit ISSUED    Transfusion Status OK TO TRANSFUSE    Crossmatch Result      Compatible Performed at [facility], [address]  [place], [place] [address]   Prepare RBC (crossmatch)  Status: None   Collection Time: 03/16/23  4:57 PM  Result Value Ref Range   Order Confirmation      ORDER PROCESSED BY BLOOD BANK Performed at [facility], [address], [place], [place] [address]      No results found.  ROS:  As stated above in the HPI otherwise negative.  Blood pressure (!) 156/83, pulse 82, temperature 98 F (36.7 C), temperature source Oral, resp. rate 13, height 4\' 11"  (1.499 m), weight 48.2 kg, SpO2 100 %.    PE: Gen: NAD, Alert and Oriented HEENT:  [place]/AT, EOMI Neck: Supple, no LAD Lungs: CTA Bilaterally CV: RRR without M/G/R ABD: Soft, NTND, +BS Ext: No C/C/E  Assessment/Plan: 1) Symptomatic anemia. 2) History of small bowel AVM. 3) Orthostasis, 4) SOB.   She is hemodynamically stable.  Her symptoms started three months ago and it progressively worsened.  A repeat enteroscopy will be performed.  Plan: 1) Enteroscopy. 2) Agree with blood transfusions. 3) Follow HGB.  Nathalya Wolanski D 03/16/2023, 6:32 PM

## 2023-03-16 NOTE — Progress Notes (Signed)
[facility] CONSULT NOTE  Patient Care Team: System, Provider Not In as PCP - General  CHIEF COMPLAINTS/PURPOSE OF CONSULTATION:  Anemia  ASSESSMENT & PLAN:   This is a pleasant 77 year old female patient referred to hematology for evaluation and recommendations regarding anemia.  Before that her visit, she had some labs drawn which showed hemoglobin of 6.1 and was reported as a critical value.  She also complains of severe fatigue, feels exhausted, short of breath and has been feeling dizzy when she tries to stand up.  She was hoping we can give her some infusions today.  She denies any blood loss in her stool per se but she did have history of GI blood loss in the past which required hospitalization.  She is also taking Advil and Celebrex for her back pain.  No concerns on exam except she happens to be in mild distress, very pale, some mild epigastric tenderness and bilateral lower extremity symmetrical swelling.  She was also sinus tachycardic at the time of her visit.  Given very severe anemia as well as being severely symptomatic, I believe she will benefit from visit to the emergency room, may require transfusion as well as some additional investigation to evaluate for acute blood loss anemia. She may require some gastroenterology evaluation again to look for the etiology of iron deficiency.  I will plan to follow-up with her outpatient.  She was okay going to the emergency room today.  HISTORY OF PRESENTING ILLNESS:  Elaine Rivera 76 y.o. female is here because of anemia  This is a 77 year old female patient with past medical history significant for blood loss anemia, chronic back pain, hypertension no longer on medication who came into the appointment today with Mr. Gerlene Burdock.  She tells me that she is feeling very tired, short of breath, has been feeling dizzy  when she stands up for the past several months but this has particularly gotten worse in the past few weeks.  She  takes Celebrex every day for pain and Advil as well.  In the past she was admitted for gastric erosion and related to blood loss but she does not have much memory of it.  She denies any blood loss in her stool or black stool.  She denies any blood loss in her urine.  She denies any known stomach surgeries or malabsorption problems.  She takes oral iron supplementation.  Rest of the pertinent review of systems reviewed and negative  MEDICAL HISTORY:  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   noted on EKG   Scoliosis    Seizures (HCC)    Manifesting with aphasia   Severe protein-calorie malnutrition (HCC)  Stroke [facility])     SURGICAL HISTORY: Past Surgical History:  Procedure Laterality Date   CEREBRAL ANEURYSM REPAIR     1998   COLONOSCOPY     ECTOPIC PREGNANCY SURGERY     x2    ENTEROSCOPY N/A 03/01/2020   Procedure: ENTEROSCOPY;  Surgeon: Jeani Hawking, MD;  [facility];  Service: Endoscopy;  Laterality: N/A;   ENTEROSCOPY N/A 11/14/2021   Procedure: ENTEROSCOPY;  Surgeon: Jeani Hawking, MD;  [facility];  Service: Endoscopy;  Laterality: N/A;   ESOPHAGOGASTRODUODENOSCOPY N/A 04/22/2016   Procedure: ESOPHAGOGASTRODUODENOSCOPY (EGD);  Surgeon: Charna Elizabeth, MD;  [facility];  Service: Endoscopy;  Laterality: N/A;   ESOPHAGOGASTRODUODENOSCOPY (EGD) WITH PROPOFOL N/A  11/12/2021   Procedure: ESOPHAGOGASTRODUODENOSCOPY (EGD) WITH PROPOFOL;  Surgeon: Jeani Hawking, MD;  [facility];  Service: Endoscopy;  Laterality: N/A;   FEMORAL HERNIA REPAIR Right 07/02/2022   Procedure: Laparoscopic right femoral hernia with mesh;  Surgeon: Axel Filler, MD;  [facility];  Service: General;  Laterality: Right;   GIVENS CAPSULE STUDY N/A 11/12/2021   Procedure: GIVENS CAPSULE STUDY;  Surgeon: Jeani Hawking, MD;  [facility];  Service: Endoscopy;  Laterality: N/A;   IR 3D INDEPENDENT WKST  09/05/2018   IR ANGIO INTRA EXTRACRAN SEL COM CAROTID INNOMINATE BILAT MOD SED  09/05/2018   IR ANGIO VERTEBRAL SEL VERTEBRAL UNI R MOD SED  09/05/2018   IR ANGIOGRAM FOLLOW UP STUDY  09/05/2018   IR RADIOLOGIST EVAL & MGMT  03/28/2021   IR TRANSCATH/EMBOLIZ  09/05/2018   PARATHYROIDECTOMY     2002   RADIOLOGY WITH ANESTHESIA N/A 09/05/2018   Procedure: EMBOLIZATION;  Surgeon: Radiologist, Medication, MD;  [facility];  Service: Radiology;  Laterality: N/A;   TONSILLECTOMY     1953   VAGINAL HYSTERECTOMY     2001   VOCAL CORD LATERALIZATION, ENDOSCOPIC APPROACH W/ MLB     2007 (@[facility] )   WRIST SURGERY     left side/2008   WRIST SURGERY     right side/ 2002    SOCIAL HISTORY: Social History   Socioeconomic History   Marital status: Divorced    Spouse name: Not on file   Number of children: Not on file   Years of education: Not on file   Highest education level: Not on file  Occupational History   Not on file  Tobacco Use   Smoking status: Former    Types: Cigarettes   Smokeless tobacco: Never  Vaping Use   Vaping Use: Never used  Substance and Sexual Activity   Alcohol use: No   Drug use: Never   Sexual activity: Not Currently  Other Topics Concern   Not on file  Social History Narrative   Not on file   Social Determinants of Health   Financial Resource Strain: Not on file  Food Insecurity: Not on file  Transportation Needs: Not on  file  Physical Activity: Not on file  Stress: Not on file  Social Connections: Not on file  Intimate Partner Violence: Not on file    FAMILY HISTORY: Family History  Problem Relation Age of Onset   Cancer Mother    Suicidality Father     ALLERGIES:  is allergic to sulfa antibiotics and tramadol.  MEDICATIONS:  Current Outpatient Medications  Medication Sig Dispense Refill   acetaminophen (TYLENOL) 500 MG tablet Take 500-1,000 mg by mouth every 6 (six) hours as needed for moderate pain or mild pain.     aspirin EC  81 MG tablet Take 1 tablet (81 mg total) by mouth daily. Please hold this medication, resume when okay with GI Dr. Loreta Ave (Patient not taking: Reported on 05/27/2022) 30 tablet 11   atorvastatin (LIPITOR) 40 MG tablet Take 40 mg by mouth daily.     Camphor-Menthol-Methyl Sal (SALONPAS) 3.10-03-08 % PTCH Apply 1 patch topically daily.     ferrous sulfate 325 (65 FE) MG EC tablet Take 1 tablet (325 mg total) by mouth daily with breakfast. (Patient not taking: Reported on 05/27/2022) 30 tablet 3   folic acid (FOLVITE) 1 MG tablet Take 1 tablet (1 mg total) by mouth daily. 30 tablet 0   levETIRAcetam (KEPPRA) 750 MG tablet Take 1 tablet (750 mg total) by mouth 2 (two) times daily. (Patient not taking: Reported on 05/27/2022) 60 tablet 0   mirtazapine (REMERON) 7.5 MG tablet Take 1 tablet (7.5 mg total) by mouth at bedtime. 30 tablet 1   oxyCODONE-acetaminophen (PERCOCET) 5-325 MG tablet Take 1 tablet by mouth every 6 (six) hours as needed for severe pain. 20 tablet 0   pantoprazole (PROTONIX) 40 MG tablet Take 1 tablet (40 mg total) by mouth daily. 30 tablet 1   potassium chloride SA (KLOR-CON M) 20 MEQ tablet Take 2 tablets (40 mEq total) by mouth daily. (Patient taking differently: Take 40 mEq by mouth 2 (two) times daily.) 42 tablet 0   senna-docusate (SENOKOT-S) 8.6-50 MG tablet Take 1 tablet by mouth at bedtime. (Patient not taking: Reported on 05/27/2022) 30 tablet 0   thiamine 100  MG tablet Take 1 tablet (100 mg total) by mouth daily. 30 tablet 0   vitamin B-12 (CYANOCOBALAMIN) 1000 MCG tablet Take 1 tablet (1,000 mcg total) by mouth daily. (Patient not taking: Reported on 05/27/2022) 30 tablet 0   vitamin C (ASCORBIC ACID) 250 MG tablet Take 1 tablet (250 mg total) by mouth daily. (Patient not taking: Reported on 05/27/2022) 30 tablet 0   No current facility-administered medications for this visit.     PHYSICAL EXAMINATION: ECOG PERFORMANCE STATUS: 2 - Symptomatic, <50% confined to bed  Vitals:   03/16/23 1434  BP: (!) 132/50  Pulse: (!) 105  Resp: 18  Temp: 97.9 F (36.6 C)  SpO2: 100%   Filed Weights   03/16/23 1434  Weight: 106 lb 4.8 oz (48.2 kg)    GENERAL:alert, very pale and appears to be in mild distress SKIN: skin color, texture, turgor are normal, no chest clear to auscultation bilaterally Heart: Tachycardia noted ABDOMEN:abdomen soft, non-tender and normal bowel sounds, mild epigastric soreness Musculoskeletal: Bilateral lower extremity 1+ swelling which is new according to the patient PSYCH: alert & oriented x 3 with fluent speech NEURO: no focal motor/sensory deficits  LABORATORY DATA:  I have reviewed the data as listed Lab Results  Component Value Date   WBC 5.1 03/16/2023   HGB 6.1 (LL) 03/16/2023   HCT 21.8 (L) 03/16/2023   MCV 74.9 (L) 03/16/2023   PLT 355 03/16/2023     Chemistry      Component Value Date/Time   NA 140 07/02/2022 0636   K 4.8 07/02/2022 0636   CL 108 07/02/2022 0636   CO2 22 07/02/2022 0636   BUN 15 07/02/2022 0636   CREATININE 0.75 07/02/2022 0636      Component Value Date/Time   CALCIUM 9.7 07/02/2022 0636   ALKPHOS 73 06/03/2022 0723   AST 21 06/03/2022 0723   ALT 13 06/03/2022 0723   BILITOT 0.7 06/03/2022 0723  RADIOGRAPHIC STUDIES: I have personally reviewed the radiological images as listed and agreed with the findings in the report. No results found.  All questions were answered.  The patient knows to call the clinic with any problems, questions or concerns. I spent 30 minutes in the care of this patient including H and P, review of records, counseling and coordination of care.     Rachel Moulds, MD 03/16/2023 2:50 PM

## 2023-03-16 NOTE — H&P (Addendum)
History and Physical  Elaine Rivera WUJ:[id number] DOB: 25-Sep-1946 DOA: 03/16/2023  PCP: System, Provider Not In   Chief Complaint: Anemia  HPI: Elaine Rivera is a 77 y.o. female with medical history significant for GERD, hyperlipidemia, scoliosis, history of upper GI bleeding not on anticoagulation, iron deficiency anemia, being admitted to the hospital with symptomatic anemia.  She was admitted to this facility about 18 months ago, with concern for upper GI bleeding.  That time she had upper endoscopy showing erosive gastropathy, in the setting of melanotic stools.  She has had intermittent iron infusions in the meantime, with no complaints of hematemesis, melena, dark stools, etc.  States that she has intermittent mild abdominal pain, but really no other GI complaints.  Due to the need for intermittent iron infusion, her PCP referred her to oncology, where she had her initial consultation today.  Prior to the consultation, blood work was done routinely, and she was noted to have a hemoglobin of 6.1.  She also has been having some severe fatigue, feeling exhausted and short of breath as well as dizzy when she tries to stand up.  Given her severe anemia, and the fact that she is still symptomatic, she was sent to the ER for evaluation.  ED Course: Emergency department, she has been stable on room air but blood pressure is low at 105/57.  Lab work was done and confirms hemoglobin of 6.1, platelets and WBC are normal.  Iron studies were done as well, shows low iron of 10, elevated TIBC 519.  CMP is essentially unremarkable.  Rectal exam was done by ED provider, demonstrates brown stool.  Review of Systems: Please see HPI for pertinent positives and negatives. A complete 10 system review of systems are otherwise negative.  Past Medical History:  Diagnosis Date   Anemia    years ago   Aneurysm (HCC)    right PICA aneurysm and stent   Aphasia    Arthritis    Basal cell carcinoma of eye     biopsy of left eye/ non cancerous   Chronic back pain    Complication of anesthesia    nausea/vomiting   Depression    Failure to thrive in adult    GERD (gastroesophageal reflux disease)    GI bleed 10/2021   acute GI bleed with gastric erosion   Headache    Herpes    History of hiatal hernia 10/2021   5 cm hiatal hernia   History of kidney stones    Hypertension    no longer on medications   Hypokalemia    Left anterior fascicular block 11/11/2021   noted on EKG   Scoliosis    Seizures (HCC)    Manifesting with aphasia   Severe protein-calorie malnutrition (HCC)    Stroke [facility])    Past Surgical History:  Procedure Laterality Date   CEREBRAL ANEURYSM REPAIR     1998   COLONOSCOPY     ECTOPIC PREGNANCY SURGERY     x2    ENTEROSCOPY N/A 03/01/2020   Procedure: ENTEROSCOPY;  Surgeon: Jeani Hawking, MD;  Location: [facility] ENDOSCOPY;  Service: Endoscopy;  Laterality: N/A;   ENTEROSCOPY N/A 11/14/2021   Procedure: ENTEROSCOPY;  Surgeon: Jeani Hawking, MD;  Location: [facility] ENDOSCOPY;  Service: Endoscopy;  Laterality: N/A;   ESOPHAGOGASTRODUODENOSCOPY N/A 04/22/2016   Procedure: ESOPHAGOGASTRODUODENOSCOPY (EGD);  Surgeon: Charna Elizabeth, MD;  Location: [facility] ENDOSCOPY;  Service: Endoscopy;  Laterality: N/A;   ESOPHAGOGASTRODUODENOSCOPY (EGD) WITH PROPOFOL N/A 11/12/2021   Procedure:  ESOPHAGOGASTRODUODENOSCOPY (EGD) WITH PROPOFOL;  Surgeon: Jeani Hawking, MD;  Location: [facility] ENDOSCOPY;  Service: Endoscopy;  Laterality: N/A;   FEMORAL HERNIA REPAIR Right 07/02/2022   Procedure: Laparoscopic right femoral hernia with mesh;  Surgeon: Axel Filler, MD;  Location: [facility] ORS;  Service: General;  Laterality: Right;   GIVENS CAPSULE STUDY N/A 11/12/2021   Procedure: GIVENS CAPSULE STUDY;  Surgeon: Jeani Hawking, MD;  Location: [facility] ENDOSCOPY;  Service: Endoscopy;  Laterality: N/A;   IR 3D INDEPENDENT WKST  09/05/2018   IR ANGIO INTRA EXTRACRAN SEL COM CAROTID INNOMINATE BILAT MOD SED  09/05/2018   IR ANGIO  VERTEBRAL SEL VERTEBRAL UNI R MOD SED  09/05/2018   IR ANGIOGRAM FOLLOW UP STUDY  09/05/2018   IR RADIOLOGIST EVAL & MGMT  03/28/2021   IR TRANSCATH/EMBOLIZ  09/05/2018   PARATHYROIDECTOMY     2002   RADIOLOGY WITH ANESTHESIA N/A 09/05/2018   Procedure: EMBOLIZATION;  Surgeon: Radiologist, Medication, MD;  Location: [facility] OR;  Service: Radiology;  Laterality: N/A;   TONSILLECTOMY     1953   VAGINAL HYSTERECTOMY     2001   VOCAL CORD LATERALIZATION, ENDOSCOPIC APPROACH W/ MLB     2007 (@[facility] )   WRIST SURGERY     left side/2008   WRIST SURGERY     right side/ 2002    Social History:  reports that she has quit smoking. Her smoking use included cigarettes. She has never used smokeless tobacco. She reports that she does not drink alcohol and does not use drugs.   Allergies  Allergen Reactions   Sulfa Antibiotics Other (See Comments)    Reaction:  Unknown    Tramadol Other (See Comments)    seizures    Family History  Problem Relation Age of Onset   Cancer Mother    Suicidality Father      Prior to Admission medications   Medication Sig Start Date End Date Taking? Authorizing Provider  acetaminophen (TYLENOL) 500 MG tablet Take 500-1,000 mg by mouth every 6 (six) hours as needed for moderate pain or mild pain.    [provider]  atorvastatin (LIPITOR) 40 MG tablet Take 40 mg by mouth daily.    [provider]  Camphor-Menthol-Methyl Sal (SALONPAS) 3.10-03-08 % PTCH Apply 1 patch topically daily.    [provider]  folic acid (FOLVITE) 1 MG tablet Take 1 tablet (1 mg total) by mouth daily. 11/15/21   Albertine Grates, MD  mirtazapine (REMERON) 7.5 MG tablet Take 1 tablet (7.5 mg total) by mouth at bedtime. 08/26/21   Willeen Niece, MD  oxyCODONE-acetaminophen (PERCOCET) 5-325 MG tablet Take 1 tablet by mouth every 6 (six) hours as needed for severe pain. 07/02/22 07/02/23  Axel Filler, MD  pantoprazole (PROTONIX) 40 MG tablet Take 1 tablet (40 mg total) by mouth  daily. 11/14/21 11/14/22  Albertine Grates, MD  potassium chloride SA (KLOR-CON M) 20 MEQ tablet Take 2 tablets (40 mEq total) by mouth daily. Patient taking differently: Take 40 mEq by mouth 2 (two) times daily. 06/03/22   Axel Filler, MD  thiamine 100 MG tablet Take 1 tablet (100 mg total) by mouth daily. 11/14/21   Albertine Grates, MD    Physical Exam: BP (!) 105/57 (BP Location: Right Arm)   Pulse 67   Temp 98.4 F (36.9 C) (Oral)   Resp 18   Ht 4\' 11"  (1.499 m)   Wt 48.2 kg   SpO2 99%   BMI 21.46 kg/m   General:  Alert, oriented,  calm, in no acute distress, very pale, daughter at bedside Eyes: EOMI, clear conjuctivae, white sclerea Neck: supple, no masses, trachea mildline  Cardiovascular: RRR, no murmurs or rubs, no peripheral edema  Respiratory: clear to auscultation bilaterally, no wheezes, no crackles  Abdomen: soft, nontender, nondistended, normal bowel tones heard  Skin: dry, no rashes  Musculoskeletal: no joint effusions, normal range of motion  Psychiatric: appropriate affect, normal speech  Neurologic: extraocular muscles intact, clear speech, moving all extremities with intact sensorium          Labs on Admission:  Basic Metabolic Panel: Recent Labs  Lab 03/16/23 1417  NA 138  K 3.9  CL 107  CO2 24  GLUCOSE 110*  BUN 13  CREATININE 0.69  CALCIUM 9.2   Liver Function Tests: Recent Labs  Lab 03/16/23 1417  AST 13*  ALT 8  ALKPHOS 106  BILITOT 0.3  PROT 6.1*  ALBUMIN 3.7   No results for input(s): "LIPASE", "AMYLASE" in the last 168 hours. No results for input(s): "AMMONIA" in the last 168 hours. CBC: Recent Labs  Lab 03/16/23 1417 03/16/23 1657  WBC 5.1 5.0  NEUTROABS 3.6 3.3  HGB 6.1* 6.1*  HCT 21.8* 22.6*  MCV 74.9* 76.1*  PLT 355 347   Cardiac Enzymes: No results for input(s): "CKTOTAL", "CKMB", "CKMBINDEX", "TROPONINI" in the last 168 hours.  BNP (last 3 results) No results for input(s): "BNP" in the last 8760 hours.  ProBNP (last 3  results) No results for input(s): "PROBNP" in the last 8760 hours.  CBG: No results for input(s): "GLUCAP" in the last 168 hours.  Radiological Exams on Admission: No results found.  Assessment/Plan This is a pleasant 77 year old female with a prior history of hypertension now off medications, hyperlipidemia, iron deficiency anemia, and upper GI bleed in February 2023 being admitted to the hospital with symptomatic anemia of unclear etiology.  Symptomatic anemia-with continued iron deficiency -Inpatient admission -Avoid blood thinners -IV PPI daily -Transfuse 1 unit PRBC (ordered by ED provider) -Recheck hemoglobin in the morning -Clear liquid diet for now, n.p.o. after midnight -Discussed with GI Dr. Loreta Ave, Dr. Elnoria Howard will see in AM  Hyperlipidemia-continue home Lipitor  Weight loss-continue Remeron  DVT prophylaxis: SCDs    Code Status: Full Code  Consults called: GI  Admission status: The appropriate patient status for this patient is INPATIENT. Inpatient status is judged to be reasonable and necessary in order to provide the required intensity of service to ensure the patient's safety. The patient's presenting symptoms, physical exam findings, and initial radiographic and laboratory data in the context of their chronic comorbidities is felt to place them at high risk for further clinical deterioration. Furthermore, it is not anticipated that the patient will be medically stable for discharge from the hospital within 2 midnights of admission.    I certify that at the point of admission it is my clinical judgment that the patient will require inpatient hospital care spanning beyond 2 midnights from the point of admission due to high intensity of service, high risk for further deterioration and high frequency of surveillance required  Time spent: 58 minutes  Jonn Chaikin Sharlette Dense MD [facility] Pager [phone]  If 7PM-7AM, please contact  night-coverage [email or web address] Password [email or web address]  03/16/2023, 5:43 PM

## 2023-03-16 NOTE — ED Triage Notes (Signed)
Patient is here for evaluation of low hemoglobin. Patient reports she was suppose to have an iron infusion, but blood was drawn and showed that her hemoglobin is low. Pt reports shortness of breath lately.

## 2023-03-16 NOTE — ED Notes (Signed)
ED TO INPATIENT HANDOFF REPORT  Name/Age/Gender Elaine Rivera 77 y.o. female  Code Status    Code Status Orders  (From admission, onward)           Start     Ordered   03/16/23 1742  Full code  Continuous       Question:  By:  Answer:  Consent: discussion documented in EHR   03/16/23 1741           Code Status History     Date Active Date Inactive Code Status Order ID Comments User Context   11/11/2021 1715 11/14/2021 2201 Full Code [id number]  Marinda Elk, MD ED   10/27/2021 0104 10/27/2021 0721 Full Code [id number]  Howerter, Chaney Born, DO ED   08/22/2021 2337 08/28/2021 1722 Full Code [id number]  Rolly Salter, MD ED   03/14/2021 0247 03/15/2021 1942 Full Code [id number]  Darlin Drop, DO ED   03/05/2021 0332 03/10/2021 1704 Full Code [id number]  Briscoe Deutscher, MD ED   02/28/2020 1747 03/01/2020 2134 DNR [id number]  Glade Lloyd, MD ED   10/03/2018 1824 10/04/2018 2129 Full Code [id number]  Carron Curie, MD ED   04/20/2016 2334 04/23/2016 1731 Full Code [id number]  Michael Litter, MD Inpatient       Home/SNF/Other Home  Chief Complaint Symptomatic anemia [D64.9]  Level of Care/Admitting Diagnosis ED Disposition     ED Disposition  Admit   Condition  --   Comment  Hospital Area: [facility] [100102]  Level of Care: Med-Surg [16]  May admit patient to [facility] or [facility] if equivalent level of care is available:: Yes  Covid Evaluation: Asymptomatic - no recent exposure (last 10 days) testing not required  Diagnosis: Symptomatic anemia [7564332]  Admitting Physician: Maryln Gottron [[id number]]  Attending Physician: Olexa.Dam, MIR Jaxson.Roy [[id number]]  Certification:: I certify this patient will need inpatient services for at least 2 midnights  Estimated Length of Stay: 3          Medical History Past Medical History:  Diagnosis Date   Anemia    years ago   Aneurysm (HCC)    right PICA aneurysm and stent   Aphasia    Arthritis     Basal cell carcinoma of eye    biopsy of left eye/ non cancerous   Chronic back pain    Complication of anesthesia    nausea/vomiting   Depression    Failure to thrive in adult    GERD (gastroesophageal reflux disease)    GI bleed 10/2021   acute GI bleed with gastric erosion   Headache    Herpes    History of hiatal hernia 10/2021   5 cm hiatal hernia   History of kidney stones    Hypertension    no longer on medications   Hypokalemia    Left anterior fascicular block 11/11/2021   noted on EKG   Scoliosis    Seizures (HCC)    Manifesting with aphasia   Severe protein-calorie malnutrition (HCC)    Stroke (HCC)     Allergies Allergies  Allergen Reactions   Sulfa Antibiotics Other (See Comments)    Reaction:  Unknown    Tramadol Other (See Comments)    seizures    IV Location/Drains/Wounds Patient Lines/Drains/Airways Status     Active Line/Drains/Airways     Name Placement date Placement time Site Days   Peripheral IV 03/16/23 20 G Left Antecubital 03/16/23  1711  Antecubital  less than 1   Incision - 3 Ports Abdomen 1: Umbilicus 2: Medial;Mid 3: Medial;Lower 07/02/22  --  -- 257            Labs/Imaging Results for orders placed or performed during the hospital encounter of 03/16/23 (from the past 48 hour(s))  Basic metabolic panel     Status: None   Collection Time: 03/16/23  4:57 PM  Result Value Ref Range   Sodium 137 135 - 145 mmol/L   Potassium 3.9 3.5 - 5.1 mmol/L   Chloride 108 98 - 111 mmol/L   CO2 22 22 - 32 mmol/L   Glucose, Bld 96 70 - 99 mg/dL    Comment: Glucose reference range applies only to samples taken after fasting for at least 8 hours.   BUN 14 8 - 23 mg/dL   Creatinine, Ser 1.61 0.44 - 1.00 mg/dL   Calcium 9.0 8.9 - 09.6 mg/dL   GFR, Estimated >04 >54 mL/min    Comment: (NOTE) Calculated using the CKD-EPI Creatinine Equation (2021)    Anion gap 7 5 - 15    Comment: Performed at [facility], [address] [place], [place] [address]  CBC with Differential     Status: Abnormal   Collection Time: 03/16/23  4:57 PM  Result Value Ref Range   WBC 5.0 4.0 - 10.5 K/uL   RBC 2.97 (L) 3.87 - 5.11 MIL/uL   Hemoglobin 6.1 (LL) 12.0 - 15.0 g/dL    Comment: REPEATED TO VERIFY Reticulocyte Hemoglobin testing may be clinically indicated, consider ordering this additional test [id number] THIS CRITICAL RESULT HAS VERIFIED AND BEEN CALLED TO JAY ARMONDO, RN BY REGINA MANGUM ON 06 18 2024 AT 1734, AND HAS BEEN READ BACK.     HCT 22.6 (L) 36.0 - 46.0 %   MCV 76.1 (L) 80.0 - 100.0 fL   MCH 20.5 (L) 26.0 - 34.0 pg   MCHC 27.0 (L) 30.0 - 36.0 g/dL   RDW 62.1 (H) 30.8 - 65.7 %   Platelets 347 150 - 400 K/uL   nRBC 0.0 0.0 - 0.2 %   Neutrophils Relative % 66 %   Neutro Abs 3.3 1.7 - 7.7 K/uL   Lymphocytes Relative 21 %   Lymphs Abs 1.1 0.7 - 4.0 K/uL   Monocytes Relative 10 %   Monocytes Absolute 0.5 0.1 - 1.0 K/uL   Eosinophils Relative 2 %   Eosinophils Absolute 0.1 0.0 - 0.5 K/uL   Basophils Relative 1 %   Basophils Absolute 0.0 0.0 - 0.1 K/uL   Immature Granulocytes 0 %   Abs Immature Granulocytes 0.01 0.00 - 0.07 K/uL    Comment: Performed at [facility], [address], [place], [place] [address]  Type and screen [facility]     Status: None (Preliminary result)   Collection Time: 03/16/23  4:57 PM  Result Value Ref Range   ABO/RH(D) O POS    Antibody Screen NEG    Sample Expiration 03/19/2023,2359    Unit Number [id number]    Blood Component Type RED CELLS,LR    Unit division 00    Status of Unit ALLOCATED    Transfusion Status OK TO TRANSFUSE    Crossmatch Result      Compatible Performed at [facility], [address] [place], [place] [address]   Prepare RBC (crossmatch)     Status: None   Collection Time: 03/16/23  4:57 PM  Result Value Ref  Range   Order Confirmation      ORDER PROCESSED BY BLOOD BANK Performed at  [facility], [address], [place], [place] [address]    No results found.  Pending Labs Unresulted Labs (From admission, onward)    None       Vitals/Pain Today's Vitals   03/16/23 1530 03/16/23 1631  BP:  (!) 105/57  Pulse:  67  Resp:  18  Temp:  98.4 F (36.9 C)  TempSrc:  Oral  SpO2:  99%  Weight: 48.2 kg   Height: 4\' 11"  (1.499 m)   PainSc: 6      Isolation Precautions No active isolations  Medications Medications  0.9 %  sodium chloride infusion (Manually program via Guardrails IV Fluids) (has no administration in time range)  pantoprazole (PROTONIX) injection 40 mg (has no administration in time range)  acetaminophen (TYLENOL) tablet 650 mg (has no administration in time range)    Or  acetaminophen (TYLENOL) suppository 650 mg (has no administration in time range)  traZODone (DESYREL) tablet 25 mg (has no administration in time range)  ondansetron (ZOFRAN) tablet 4 mg (has no administration in time range)    Or  ondansetron (ZOFRAN) injection 4 mg (has no administration in time range)  albuterol (PROVENTIL) (2.5 MG/3ML) 0.083% nebulizer solution 2.5 mg (has no administration in time range)  atorvastatin (LIPITOR) tablet 40 mg (has no administration in time range)  mirtazapine (REMERON) tablet 7.5 mg (has no administration in time range)  pantoprazole (PROTONIX) injection 40 mg (has no administration in time range)    Mobility walks with person assist

## 2023-03-17 ENCOUNTER — Encounter (HOSPITAL_COMMUNITY)
Admission: EM | Disposition: A | Discharge status: Home / Self Care | Payer: Self-pay | Source: Home / Self Care | Attending: Internal Medicine

## 2023-03-17 ENCOUNTER — Inpatient Hospital Stay (HOSPITAL_COMMUNITY): Payer: HMO | Admitting: Anesthesiology

## 2023-03-17 ENCOUNTER — Encounter (HOSPITAL_COMMUNITY): Payer: Self-pay | Admitting: Internal Medicine

## 2023-03-17 ENCOUNTER — Telehealth: Payer: Self-pay | Admitting: Hematology and Oncology

## 2023-03-17 DIAGNOSIS — Z87891 Personal history of nicotine dependence: Secondary | ICD-10-CM

## 2023-03-17 DIAGNOSIS — K259 Gastric ulcer, unspecified as acute or chronic, without hemorrhage or perforation: Secondary | ICD-10-CM

## 2023-03-17 DIAGNOSIS — D649 Anemia, unspecified: Secondary | ICD-10-CM | POA: Diagnosis not present

## 2023-03-17 DIAGNOSIS — K449 Diaphragmatic hernia without obstruction or gangrene: Secondary | ICD-10-CM

## 2023-03-17 DIAGNOSIS — D509 Iron deficiency anemia, unspecified: Secondary | ICD-10-CM

## 2023-03-17 DIAGNOSIS — I10 Essential (primary) hypertension: Secondary | ICD-10-CM

## 2023-03-17 HISTORY — PX: ENTEROSCOPY: SHX5533

## 2023-03-17 HISTORY — PX: BIOPSY: SHX5522

## 2023-03-17 LAB — HEMOGLOBIN AND HEMATOCRIT, BLOOD
HCT: 24.2 % — ABNORMAL LOW (ref 36.0–46.0)
HCT: 25.9 % — ABNORMAL LOW (ref 36.0–46.0)
HCT: 26.7 % — ABNORMAL LOW (ref 36.0–46.0)
Hemoglobin: 7.2 g/dL — ABNORMAL LOW (ref 12.0–15.0)
Hemoglobin: 7.6 g/dL — ABNORMAL LOW (ref 12.0–15.0)
Hemoglobin: 7.7 g/dL — ABNORMAL LOW (ref 12.0–15.0)

## 2023-03-17 LAB — BPAM RBC

## 2023-03-17 SURGERY — ENTEROSCOPY
Anesthesia: Monitor Anesthesia Care

## 2023-03-17 MED ORDER — SENNOSIDES-DOCUSATE SODIUM 8.6-50 MG PO TABS: 1.0000 | ORAL_TABLET | Freq: Every evening | ORAL | Status: DC | PRN

## 2023-03-17 MED ORDER — LACTATED RINGERS IV SOLN: INTRAVENOUS | Status: DC

## 2023-03-17 MED ORDER — SODIUM CHLORIDE 0.9 % IV SOLN: INTRAVENOUS | Status: DC

## 2023-03-17 MED ORDER — SODIUM CHLORIDE 0.9 % IV SOLN
250.0000 mg | Freq: Every day | INTRAVENOUS | Status: DC
  Administered 2023-03-17 – 2023-03-18 (×2): 250 mg via INTRAVENOUS
  Filled 2023-03-17 (×2): qty 20

## 2023-03-17 MED ORDER — PROPOFOL 500 MG/50ML IV EMUL
INTRAVENOUS | Status: AC
  Filled 2023-03-17: qty 50

## 2023-03-17 MED ORDER — PROPOFOL 10 MG/ML IV BOLUS
INTRAVENOUS | Status: AC
  Filled 2023-03-17: qty 20

## 2023-03-17 MED ORDER — LIDOCAINE 2% (20 MG/ML) 5 ML SYRINGE
INTRAMUSCULAR | Status: DC | PRN
  Administered 2023-03-17: 100 mg via INTRAVENOUS

## 2023-03-17 MED ORDER — PROPOFOL 500 MG/50ML IV EMUL
INTRAVENOUS | Status: DC | PRN
  Administered 2023-03-17: 125 ug/kg/min via INTRAVENOUS

## 2023-03-17 MED ORDER — IPRATROPIUM-ALBUTEROL 0.5-2.5 (3) MG/3ML IN SOLN: 3.0000 mL | RESPIRATORY_TRACT | Status: DC | PRN

## 2023-03-17 MED ORDER — PHENYLEPHRINE 80 MCG/ML (10ML) SYRINGE FOR IV PUSH (FOR BLOOD PRESSURE SUPPORT)
PREFILLED_SYRINGE | INTRAVENOUS | Status: DC | PRN
  Administered 2023-03-17: 160 ug via INTRAVENOUS

## 2023-03-17 MED ORDER — METOPROLOL TARTRATE 5 MG/5ML IV SOLN: 5.0000 mg | INTRAVENOUS | Status: DC | PRN

## 2023-03-17 MED ORDER — HYDRALAZINE HCL 20 MG/ML IJ SOLN: 10.0000 mg | INTRAMUSCULAR | Status: DC | PRN

## 2023-03-17 MED ORDER — GUAIFENESIN 100 MG/5ML PO LIQD: 5.0000 mL | ORAL | Status: DC | PRN

## 2023-03-17 MED ORDER — PROPOFOL 10 MG/ML IV BOLUS
INTRAVENOUS | Status: DC | PRN
  Administered 2023-03-17: 20 mg via INTRAVENOUS

## 2023-03-17 NOTE — Progress Notes (Signed)
OT Cancellation Note  Patient Details Name: Elaine Rivera MRN: [id number] DOB: 01-Jan-1946   Cancelled Treatment:    Reason Eval/Treat Not Completed: Patient at procedure or test/ unavailable Patient is off hall at endoscopy. OT to continue to follow and check back at another time Rosalio Loud, [place] Acute Rehabilitation Department Office# [phone]  03/17/2023, 1:00 PM

## 2023-03-17 NOTE — Progress Notes (Signed)
Mobility Specialist - Progress Note   03/17/23 0947  Mobility  Activity Transferred from bed to chair  Level of Assistance Contact guard assist, steadying assist  Assistive Device None  Distance Ambulated (ft) 5 ft  Range of Motion/Exercises Active  Activity Response Tolerated well  Mobility Referral Yes  $Mobility charge 1 Mobility  Mobility Specialist Start Time (ACUTE ONLY) 0935  Mobility Specialist Stop Time (ACUTE ONLY) 0943  Mobility Specialist Time Calculation (min) (ACUTE ONLY) 8 min   Pt received in bed and agreed to transfer. Pt was IND for bed mobility, contact for STS, and short steps to chair where she was left with all needs met and alarm on.   Marilynne Halsted Mobility Specialist

## 2023-03-17 NOTE — Progress Notes (Signed)
I was called that the patient had gone unresponsive while being transported to her room and was brought back to the recovery area. Upon arrival, the patient appeared pale and diaphoretic, complaining of nausea. She received 4mg  of Zofran IV and her IV fluids were opened up to receive a 500cc bolus of LR. The patient felt better quickly after the Zofran was administered. She was fully awake and oriented x 3. VSS. I believe that she had a vagal episode and should be fine to return to her room.

## 2023-03-17 NOTE — Progress Notes (Signed)
PT Cancellation Note  Patient Details Name: RYNLEIGH STAAL MRN: [id number] DOB: 12-20-1945   Cancelled Treatment:    Reason Eval/Treat Not Completed: Patient at procedure or test/unavailable    Faye Ramsay, PT Acute Rehabilitation  Office: [phone]

## 2023-03-17 NOTE — Progress Notes (Signed)
1345pt oob to w/c with minimal assist. Bringing patient back to room, got out into hallway and patient went unresponsive.  Brought back to unit, pt woke up and was assisted back to bed. Anesthesia at bedside. Pt c/o nausea. Zofran 4 mg given as ordered.  1400 pt states she is feeling much better. Anesthesia Dr fitzgerald in to see patient again and stated she could go back to medical floor

## 2023-03-17 NOTE — Anesthesia Preprocedure Evaluation (Addendum)
Anesthesia Evaluation  Patient identified by MRN, date of birth, ID band Patient awake    Reviewed: Allergy & Precautions, H&P , NPO status , Patient's Chart, lab work & pertinent test results  Airway Mallampati: II  TM Distance: >3 FB Neck ROM: Full    Dental no notable dental hx. (+) Teeth Intact, Dental Advisory Given   Pulmonary shortness of breath and with exertion, former smoker   Pulmonary exam normal breath sounds clear to auscultation       Cardiovascular hypertension, Pt. on medications  Rhythm:Regular Rate:Normal     Neuro/Psych  Headaches, Seizures -, Well Controlled,   Anxiety Depression    CVA, No Residual Symptoms    GI/Hepatic Neg liver ROS, hiatal hernia,GERD  Medicated,,  Endo/Other  negative endocrine ROS    Renal/GU negative Renal ROS  negative genitourinary   Musculoskeletal  (+) Arthritis , Osteoarthritis,    Abdominal   Peds  Hematology  (+) Blood dyscrasia, anemia   Anesthesia Other Findings   Reproductive/Obstetrics negative OB ROS                             Anesthesia Physical Anesthesia Plan  ASA: 3  Anesthesia Plan: MAC   Post-op Pain Management: Minimal or no pain anticipated   Induction: Intravenous  PONV Risk Score and Plan: 2 and Propofol infusion and Treatment may vary due to age or medical condition  Airway Management Planned: Natural Airway and Simple Face Mask  Additional Equipment:   Intra-op Plan:   Post-operative Plan:   Informed Consent: I have reviewed the patients History and Physical, chart, labs and discussed the procedure including the risks, benefits and alternatives for the proposed anesthesia with the patient or authorized representative who has indicated his/her understanding and acceptance.     Dental advisory given  Plan Discussed with: CRNA  Anesthesia Plan Comments:        Anesthesia Quick Evaluation

## 2023-03-17 NOTE — Telephone Encounter (Signed)
Spoke with patient confirming upcoming appointment  

## 2023-03-17 NOTE — Op Note (Signed)
[facility] Patient Name: Elaine Rivera Procedure Date: 03/17/2023 MRN: [id number] Attending MD: Jeani Hawking , MD, [id number] Date of Birth: 11/26/1945 CSN: [id number] Age: 77 Admit Type: Inpatient Procedure:                Small bowel enteroscopy Indications:              Iron deficiency anemia Providers:                Jeani Hawking, MD, Fransisca Connors, Beryle Beams,                            Technician Referring MD:              Medicines:                Propofol per Anesthesia Complications:            No immediate complications. Estimated Blood Loss:     Estimated blood loss was minimal. Procedure:                Pre-Anesthesia Assessment:                           - Prior to the procedure, a History and Physical                            was performed, and patient medications and                            allergies were reviewed. The patient's tolerance of                            previous anesthesia was also reviewed. The risks                            and benefits of the procedure and the sedation                            options and risks were discussed with the patient.                            All questions were answered, and informed consent                            was obtained. Prior Anticoagulants: The patient has                            taken no anticoagulant or antiplatelet agents. ASA                            Grade Assessment: III - A patient with severe                            systemic disease. After reviewing the risks and  benefits, the patient was deemed in satisfactory                            condition to undergo the procedure.                           - Sedation was administered by an anesthesia                            professional. Deep sedation was attained.                           After obtaining informed consent, the endoscope was                            passed under direct  vision. Throughout the                            procedure, the patient's blood pressure, pulse, and                            oxygen saturations were monitored continuously. The                            [id number]-HQ190L ([id number] Olympus colonoscope was                            introduced through the mouth and advanced to the                            proximal jejunum. The small bowel enteroscopy was                            accomplished without difficulty. The patient                            tolerated the procedure well. Scope In: Scope Out: Findings:      A 5 cm hiatal hernia was present.      Multiple non-bleeding superficial gastric ulcers with a clean ulcer base       (Forrest Class III) were found in the gastric body. The largest lesion       was 5 mm in largest dimension. This was biopsied with a cold forceps for       Helicobacter pylori testing.      The examined duodenum was normal.      There was no evidence of significant pathology in the entire examined       portion of jejunum.      Two deep passes into the small bowel were performed. It was negative for       any bleeding. In the proximal gastric body multiple superficial small       gastric ulcers were found. There was also an associated gastritis. Cold       biopsies were obtained. There was evidence of friability and mild       bleeding with mucosal contact in these areas. Impression:               -  5 cm hiatal hernia.                           - Non-bleeding gastric ulcers with a clean ulcer                            base (Forrest Class III). Biopsied.                           - Normal examined duodenum.                           - The examined portion of the jejunum was normal. Recommendation:           - Return patient to hospital ward for ongoing care.                           - Resume regular diet.                           - Await pathology results.                           - PPI QD indefinitely.                            - Follow up with Dr. Loreta Ave in 1-2 weeks upon                            discharge.                           - Avoid NSAIDs. Procedure Code(s):        --- Professional ---                           [phone], Small intestinal endoscopy, enteroscopy                            beyond second portion of duodenum, not including                            ileum; with biopsy, single or multiple Diagnosis Code(s):        --- Professional ---                           K25.9, Gastric ulcer, unspecified as acute or                            chronic, without hemorrhage or perforation                           K44.9, Diaphragmatic hernia without obstruction or                            gangrene  D50.9, Iron deficiency anemia, unspecified CPT copyright 2022 [employer]. All rights reserved. The codes documented in this report are preliminary and upon coder review may  be revised to meet current compliance requirements. Jeani Hawking, MD Jeani Hawking, MD 03/17/2023 1:15:08 PM This report has been signed electronically. Number of Addenda: 0

## 2023-03-17 NOTE — Progress Notes (Signed)
PROGRESS NOTE    Elaine Rivera  ZOX:[id number] DOB: 05-04-1946 DOA: 03/16/2023 PCP: System, Provider Not In   Brief Narrative:  77 year old with history of GERD, HLD, scoliosis, upper GI bleed not on anticoagulation, iron deficiency anemia admitted for symptomatic anemia.  She was referred to hematology and labs were drawn prior to her visit which showed hemoglobin of 6.1 therefore referred to the ER.  Patient was found to have hemoglobin of 6.1, GI team was consulted.  GI planning on enteroscopy and patient receiving PRBC transfusion.   Assessment & Plan:  Active Problems:   Symptomatic anemia   Acute symptomatic anemia Severe iron deficiency -Baseline Hb 13,  Admission hemoglobin 6.1, received 1 unit PRBC transfusion.  GI team consulted planning for enteroscopy.  Continue PPI. -Will give IV iron while here and eventually will need p.o. Repeat heme globin this morning 7.6  Hyperlipidemia - Statin   DVT prophylaxis: SCDs Code Status: Full code Family Communication:   On going eval for GIB. Cont hosp stay       Diet Orders (From admission, onward)     Start     Ordered   03/17/23 0001  Diet NPO time specified  Diet effective midnight        03/16/23 1742            Subjective:  No complaints, after blood transfusion tells me she feels slightly better.  Examination:  General exam: Appears calm and comfortable  Respiratory system: Clear to auscultation. Respiratory effort normal. Cardiovascular system: S1 & S2 heard, RRR. No JVD, murmurs, rubs, gallops or clicks. No pedal edema. Gastrointestinal system: Abdomen is nondistended, soft and nontender. No organomegaly or masses felt. Normal bowel sounds heard. Central nervous system: Alert and oriented. No focal neurological deficits. Extremities: Symmetric 5 x 5 power. Skin: No rashes, lesions or ulcers Psychiatry: Judgement and insight appear normal. Mood & affect appropriate.  Objective: Vitals:   03/16/23  2132 03/16/23 2132 03/17/23 0140 03/17/23 0554  BP: 131/71 131/71 127/68 (!) 138/96  Pulse: 73 73 77 72  Resp: 16 17 16 18   Temp: 98.1 F (36.7 C) 98.1 F (36.7 C) 98 F (36.7 C) (!) 97.3 F (36.3 C)  TempSrc: Oral Oral Oral Oral  SpO2:  97% 98% 96%  Weight:      Height:        Intake/Output Summary (Last 24 hours) at 03/17/2023 0748 Last data filed at 03/16/2023 2132 Gross per 24 hour  Intake 500 ml  Output --  Net 500 ml   Filed Weights   03/16/23 1530  Weight: 48.2 kg    Scheduled Meds:  atorvastatin  40 mg Oral Daily   mirtazapine  7.5 mg Oral QHS   pantoprazole (PROTONIX) IV  40 mg Intravenous Q24H   Continuous Infusions:  Nutritional status     Body mass index is 21.46 kg/m.  Data Reviewed:   CBC: Recent Labs  Lab 03/16/23 1417 03/16/23 1657 03/17/23 0058  WBC 5.1 5.0  --   NEUTROABS 3.6 3.3  --   HGB 6.1* 6.1* 7.2*  HCT 21.8* 22.6* 24.2*  MCV 74.9* 76.1*  --   PLT 355 347  --    Basic Metabolic Panel: Recent Labs  Lab 03/16/23 1417 03/16/23 1657  NA 138 137  K 3.9 3.9  CL 107 108  CO2 24 22  GLUCOSE 110* 96  BUN 13 14  CREATININE 0.69 0.74  CALCIUM 9.2 9.0   GFR: Estimated Creatinine Clearance: 40.2 mL/min (  by C-G formula based on SCr of 0.74 mg/dL). Liver Function Tests: Recent Labs  Lab 03/16/23 1417  AST 13*  ALT 8  ALKPHOS 106  BILITOT 0.3  PROT 6.1*  ALBUMIN 3.7   No results for input(s): "LIPASE", "AMYLASE" in the last 168 hours. No results for input(s): "AMMONIA" in the last 168 hours. Coagulation Profile: No results for input(s): "INR", "PROTIME" in the last 168 hours. Cardiac Enzymes: No results for input(s): "CKTOTAL", "CKMB", "CKMBINDEX", "TROPONINI" in the last 168 hours. BNP (last 3 results) No results for input(s): "PROBNP" in the last 8760 hours. HbA1C: No results for input(s): "HGBA1C" in the last 72 hours. CBG: No results for input(s): "GLUCAP" in the last 168 hours. Lipid Profile: No results for  input(s): "CHOL", "HDL", "LDLCALC", "TRIG", "CHOLHDL", "LDLDIRECT" in the last 72 hours. Thyroid Function Tests: No results for input(s): "TSH", "T4TOTAL", "FREET4", "T3FREE", "THYROIDAB" in the last 72 hours. Anemia Panel: Recent Labs    03/16/23 1417  FERRITIN 3*  TIBC 519*  IRON 10*   Sepsis Labs: No results for input(s): "PROCALCITON", "LATICACIDVEN" in the last 168 hours.  No results found for this or any previous visit (from the past 240 hour(s)).       Radiology Studies: No results found.         LOS: 1 day   Time spent= 35 mins    Collins Dimaria Joline Maxcy, MD [facility]  If 7PM-7AM, please contact night-coverage  03/17/2023, 7:48 AM

## 2023-03-17 NOTE — Interval H&P Note (Signed)
History and Physical Interval Note:  03/17/2023 12:47 PM  Elaine Rivera  has presented today for surgery, with the diagnosis of GI bleed.  The various methods of treatment have been discussed with the patient and family. After consideration of risks, benefits and other options for treatment, the patient has consented to  Procedure(s): ENTEROSCOPY (N/A) as a surgical intervention.  The patient's history has been reviewed, patient examined, no change in status, stable for surgery.  I have reviewed the patient's chart and labs.  Questions were answered to the patient's satisfaction.     Karla Vines D

## 2023-03-17 NOTE — Transfer of Care (Signed)
Immediate Anesthesia Transfer of Care Note  Patient: Elaine Rivera  Procedure(s) Performed: ENTEROSCOPY BIOPSY  Patient Location: Endoscopy Unit  Anesthesia Type:MAC  Level of Consciousness: drowsy  Airway & Oxygen Therapy: Patient Spontanous Breathing and Patient connected to face mask oxygen  Post-op Assessment: Report given to RN and Post -op Vital signs reviewed and stable  Post vital signs: Reviewed and stable  Last Vitals:  Vitals Value Taken Time  BP    Temp    Pulse 85 03/17/23 1315  Resp 20 03/17/23 1315  SpO2 100 % 03/17/23 1315  Vitals shown include unvalidated device data.  Last Pain:  Vitals:   03/17/23 1200  TempSrc: Temporal  PainSc: 0-No pain         Complications: No notable events documented.

## 2023-03-17 NOTE — Anesthesia Postprocedure Evaluation (Signed)
Anesthesia Post Note  Patient: Elaine Rivera  Procedure(s) Performed: ENTEROSCOPY BIOPSY     Patient location during evaluation: Endoscopy Anesthesia Type: MAC Level of consciousness: awake and alert Pain management: pain level controlled Vital Signs Assessment: post-procedure vital signs reviewed and stable Respiratory status: spontaneous breathing, nonlabored ventilation and respiratory function stable Cardiovascular status: stable and blood pressure returned to baseline Postop Assessment: no apparent nausea or vomiting Anesthetic complications: no  No notable events documented.  Last Vitals:  Vitals:   03/17/23 1405 03/17/23 1410  BP: (!) 118/57 (!) 126/57  Pulse: 62 62  Resp: 15 16  Temp:    SpO2: 96% 93%    Last Pain:  Vitals:   03/17/23 1335  TempSrc:   PainSc: 0-No pain                 Suhaila Troiano,W. EDMOND

## 2023-03-18 DIAGNOSIS — K254 Chronic or unspecified gastric ulcer with hemorrhage: Secondary | ICD-10-CM

## 2023-03-18 LAB — CBC
HCT: 26.4 % — ABNORMAL LOW (ref 36.0–46.0)
Hemoglobin: 7.6 g/dL — ABNORMAL LOW (ref 12.0–15.0)
MCH: 22.3 pg — ABNORMAL LOW (ref 26.0–34.0)
MCHC: 28.8 g/dL — ABNORMAL LOW (ref 30.0–36.0)
MCV: 77.4 fL — ABNORMAL LOW (ref 80.0–100.0)
Platelets: 309 10*3/uL (ref 150–400)
RBC: 3.41 MIL/uL — ABNORMAL LOW (ref 3.87–5.11)
RDW: 18.1 % — ABNORMAL HIGH (ref 11.5–15.5)
WBC: 5.9 10*3/uL (ref 4.0–10.5)
nRBC: 0 % (ref 0.0–0.2)

## 2023-03-18 LAB — HEMOGLOBIN AND HEMATOCRIT, BLOOD
HCT: 26.1 % — ABNORMAL LOW (ref 36.0–46.0)
HCT: 31 % — ABNORMAL LOW (ref 36.0–46.0)
Hemoglobin: 7.5 g/dL — ABNORMAL LOW (ref 12.0–15.0)
Hemoglobin: 9.2 g/dL — ABNORMAL LOW (ref 12.0–15.0)

## 2023-03-18 LAB — BASIC METABOLIC PANEL
Anion gap: 5 (ref 5–15)
BUN: 12 mg/dL (ref 8–23)
CO2: 23 mmol/L (ref 22–32)
Calcium: 8.5 mg/dL — ABNORMAL LOW (ref 8.9–10.3)
Chloride: 109 mmol/L (ref 98–111)
Creatinine, Ser: 0.75 mg/dL (ref 0.44–1.00)
GFR, Estimated: >60 mL/min (ref >60)
Glucose, Bld: 105 mg/dL — ABNORMAL HIGH (ref 70–99)
Potassium: 3.5 mmol/L (ref 3.5–5.1)
Sodium: 137 mmol/L (ref 135–145)

## 2023-03-18 LAB — PREPARE RBC (CROSSMATCH)

## 2023-03-18 LAB — SURGICAL PATHOLOGY

## 2023-03-18 LAB — BPAM RBC: Unit Type and Rh: 5100

## 2023-03-18 LAB — MAGNESIUM: Magnesium: 2 mg/dL (ref 1.7–2.4)

## 2023-03-18 MED ORDER — SODIUM CHLORIDE 0.9% IV SOLUTION: Freq: Once | INTRAVENOUS | Status: AC

## 2023-03-18 MED ORDER — PANTOPRAZOLE SODIUM 40 MG PO TBEC
40.0000 mg | DELAYED_RELEASE_TABLET | Freq: Every day | ORAL | Status: DC
  Administered 2023-03-18: 40 mg via ORAL
  Filled 2023-03-18: qty 1

## 2023-03-18 MED ORDER — DOCUSATE SODIUM 100 MG PO CAPS: 100.0000 mg | ORAL_CAPSULE | Freq: Two times a day (BID) | ORAL | 0 refills | Status: DC

## 2023-03-18 MED ORDER — ATORVASTATIN CALCIUM 40 MG PO TABS: 40.0000 mg | ORAL_TABLET | Freq: Every day | ORAL | 0 refills | Status: DC

## 2023-03-18 MED ORDER — FERROUS SULFATE 325 (65 FE) MG PO TBEC: 325.0000 mg | DELAYED_RELEASE_TABLET | Freq: Every day | ORAL | 0 refills | Status: DC

## 2023-03-18 MED ORDER — PANTOPRAZOLE SODIUM 40 MG PO TBEC: 40.0000 mg | DELAYED_RELEASE_TABLET | Freq: Every day | ORAL | 0 refills | Status: DC

## 2023-03-18 MED ORDER — POTASSIUM CHLORIDE 20 MEQ PO PACK
40.0000 meq | PACK | Freq: Once | ORAL | Status: AC
  Administered 2023-03-18: 40 meq via ORAL
  Filled 2023-03-18: qty 2

## 2023-03-18 NOTE — Evaluation (Signed)
Physical Therapy Evaluation Patient Details Name: Elaine Rivera MRN: [id number] DOB: 10/06/1945 Today's Date: 03/18/2023  History of Present Illness  Pt is a 77 year old with history of GERD, HLD, scoliosis, upper GI bleed not on anticoagulation, iron deficiency anemia and admitted for symptomatic anemia secondary to non bleeding gastric ulcers and severe iron deficiency.  Clinical Impression  Patient evaluated by Physical Therapy with no further acute PT needs identified. All education has been completed and the patient has no further questions.  Pt requesting to use bathroom on arrival to room.  Pt ambulating in room without assist.  Upon pt exiting bathroom, RN into room with PRBCs and reported she needed to start IV, so pt did not ambulate in hallway.  RN reports pt has been mobilizing well in room today.  Pt declines need for DME at this time and plans to return home upon d/c which may be later this afternoon (per RN).  PT is signing off. Thank you for this referral.        Recommendations for follow up therapy are one component of a multi-disciplinary discharge planning process, led by the attending physician.  Recommendations may be updated based on patient status, additional functional criteria and insurance authorization.  Follow Up Recommendations       Assistance Recommended at Discharge PRN  Patient can return home with the following       Equipment Recommendations None recommended by PT  Recommendations for Other Services       Functional Status Assessment Patient has not had a recent decline in their functional status     Precautions / Restrictions Precautions Precautions: None      Mobility  Bed Mobility Overal bed mobility: Modified Independent                  Transfers Overall transfer level: Needs assistance Equipment used: None Transfers: Sit to/from Stand Sit to Stand: Supervision                Ambulation/Gait Ambulation/Gait  assistance: Supervision Gait Distance (Feet): 12 Feet Assistive device: None Gait Pattern/deviations: WFL(Within Functional Limits)       General Gait Details: pt ambulated to/from bathroom without difficulty, holding doorframe upon exiting bathroom but no LOB or unsteadiness observed  Stairs            Wheelchair Mobility    Modified Rankin (Stroke Patients Only)       Balance Overall balance assessment: No apparent balance deficits (not formally assessed)                                           Pertinent Vitals/Pain Pain Assessment Pain Assessment: No/denies pain    Home Living Family/patient expects to be discharged to:: Private residence Living Arrangements: Alone   Type of Home: House         Home Layout: One level Home Equipment: Rollator (4 wheels)      Prior Function Prior Level of Function : Independent/Modified Independent                     Hand Dominance        Extremity/Trunk Assessment        Lower Extremity Assessment Lower Extremity Assessment: Overall WFL for tasks assessed    Cervical / Trunk Assessment Cervical / Trunk Assessment: Kyphotic;Other exceptions Cervical / Trunk Exceptions: scoliosis  Communication  Communication: No difficulties  Cognition Arousal/Alertness: Awake/alert Behavior During Therapy: WFL for tasks assessed/performed Overall Cognitive Status: Within Functional Limits for tasks assessed                                          General Comments      Exercises     Assessment/Plan    PT Assessment Patient does not need any further PT services  PT Problem List         PT Treatment Interventions      PT Goals (Current goals can be found in the Care Plan section)  Acute Rehab PT Goals PT Goal Formulation: All assessment and education complete, DC therapy    Frequency       Co-evaluation               AM-PAC PT "6 Clicks" Mobility   Outcome Measure Help needed turning from your back to your side while in a flat bed without using bedrails?: None Help needed moving from lying on your back to sitting on the side of a flat bed without using bedrails?: None Help needed moving to and from a bed to a chair (including a wheelchair)?: None Help needed standing up from a chair using your arms (e.g., wheelchair or bedside chair)?: None Help needed to walk in hospital room?: None Help needed climbing 3-5 steps with a railing? : A Little 6 Click Score: 23    End of Session   Activity Tolerance: Patient tolerated treatment well Patient left: in bed;with call bell/phone within reach;with nursing/sitter in room   PT Visit Diagnosis: Difficulty in walking, not elsewhere classified (R26.2)    Time: 4098-1191 PT Time Calculation (min) (ACUTE ONLY): 9 min   Charges:   PT Evaluation $PT Eval Low Complexity: 1 Low     Kati PT, DPT Physical Therapist Acute Rehabilitation Services Office: [phone]   Janan Halter [place] 03/18/2023, 1:39 PM

## 2023-03-18 NOTE — Progress Notes (Signed)
OT Cancellation Note  Patient Details Name: Elaine Rivera MRN: [id number] DOB: 12-11-1945   Cancelled Treatment:     Patient is receiving a blood transfusion.    Reuben Likes, OTR/L 03/18/2023, 5:04 PM

## 2023-03-18 NOTE — Discharge Summary (Signed)
Physician Discharge Summary  Elaine Rivera UJW:[id number] DOB: 28-Feb-1946 DOA: 03/16/2023  PCP: System, Provider Not In  Admit date: 03/16/2023 Discharge date: 03/18/2023  Admitted From: Home Disposition:  Home  Recommendations for Outpatient Follow-up:  Follow up with PCP in 1-2 weeks Please obtain BMP/CBC in one week your next doctors visit.  GI will follow-up outpatient with biopsy results Advised to discontinue NSAIDs Daily PPI indefinitely Iron supplements with bowel regimen.  Repeat iron studies with PCP in about 1 month   Discharge Condition: Stable CODE STATUS: Full  Diet recommendation: 2g na  Brief/Interim Summary:  77 year old with history of GERD, HLD, scoliosis, upper GI bleed not on anticoagulation, iron deficiency anemia admitted for symptomatic anemia.  She was referred to hematology and labs were drawn prior to her visit which showed hemoglobin of 6.1 therefore referred to the ER.  Patient was found to have hemoglobin of 6.1, GI team was consulted.  GI planning on enteroscopy and patient receiving PRBC transfusion. Endoscopy on 6/19 showed multiple nonbleeding superficial ulcers, biopsy was taken.  GI recommended PPI indefinitely with outpatient follow-up. Was also found to be severely iron deficient received IV iron during hospitalization and given p.o. upon discharge.  Patient's daughter has been updated by me  Acute symptomatic anemia secondary to non bleeding gastric ulcers Severe iron deficiency -Baseline Hb 13,  Admission hemoglobin 6.1, which improved hemoglobin to 7.4.  Will give another unit PRBC today prior to discharge.  Endoscopy has shown nonbleeding multiple superficial ulcers status post biopsy which will be followed up outpatient.  Will discharge on PPI indefinitely along with iron supplements and bowel regimen.  GI will arrange for outpatient follow-up.   Hyperlipidemia - Statin   Discharge Diagnoses:  Active Problems:   Symptomatic  anemia      Consultations: Gastroenterology, Dr. Elnoria Howard  Subjective: Seen and examined at bedside, no complaints.  She is agreeable to 1 unit PRBC transfusion prior to discharge.  Discharge Exam: Vitals:   03/18/23 1116 03/18/23 1154  BP: 126/71 (!) 144/78  Pulse: 71 74  Resp: 14 16  Temp: 98.1 F (36.7 C) 98.7 F (37.1 C)  SpO2: 98% 97%   Vitals:   03/17/23 2153 03/18/23 0549 03/18/23 1116 03/18/23 1154  BP: 107/63 115/73 126/71 (!) 144/78  Pulse: 80 70 71 74  Resp: 14 14 14 16   Temp: 98.1 F (36.7 C) 98.1 F (36.7 C) 98.1 F (36.7 C) 98.7 F (37.1 C)  TempSrc: Oral Oral Oral Oral  SpO2: 94% 94% 98% 97%  Weight:      Height:        General: Pt is alert, awake, not in acute distress Cardiovascular: RRR, S1/S2 +, no rubs, no gallops Respiratory: CTA bilaterally, no wheezing, no rhonchi Abdominal: Soft, NT, ND, bowel sounds + Extremities: no edema, no cyanosis  Discharge Instructions   Allergies as of 03/18/2023       Reactions   Sulfa Antibiotics Other (See Comments)   Reaction:  Unknown    Tramadol Other (See Comments)   seizures        Medication List     STOP taking these medications    celecoxib 100 MG capsule Commonly known as: CELEBREX   potassium chloride SA 20 MEQ tablet Commonly known as: KLOR-CON M       TAKE these medications    acetaminophen 500 MG tablet Commonly known as: TYLENOL Take 500-1,000 mg by mouth every 6 (six) hours as needed for moderate pain or mild pain.  atorvastatin 40 MG tablet Commonly known as: LIPITOR Take 1 tablet (40 mg total) by mouth daily.   docusate sodium 100 MG capsule Commonly known as: Colace Take 1 capsule (100 mg total) by mouth 2 (two) times daily.   ferrous sulfate 325 (65 FE) MG EC tablet Take 1 tablet (325 mg total) by mouth daily with breakfast.   folic acid 1 MG tablet Commonly known as: FOLVITE Take 1 tablet (1 mg total) by mouth daily.   mirtazapine 7.5 MG tablet Commonly  known as: REMERON Take 1 tablet (7.5 mg total) by mouth at bedtime.   oxyCODONE-acetaminophen 5-325 MG tablet Commonly known as: Percocet Take 1 tablet by mouth every 6 (six) hours as needed for severe pain.   pantoprazole 40 MG tablet Commonly known as: PROTONIX Take 1 tablet (40 mg total) by mouth daily before breakfast. Start taking on: March 19, 2023 What changed: when to take this   Salonpas 3.10-03-08 % Ptch Generic drug: Camphor-Menthol-Methyl Sal Apply 1 patch topically 2 (two) times daily as needed (For pain).   thiamine 100 MG tablet Commonly known as: VITAMIN B1 Take 1 tablet (100 mg total) by mouth daily.        Allergies  Allergen Reactions   Sulfa Antibiotics Other (See Comments)    Reaction:  Unknown    Tramadol Other (See Comments)    seizures    You were cared for by a hospitalist during your hospital stay. If you have any questions about your discharge medications or the care you received while you were in the hospital after you are discharged, you can call the unit and asked to speak with the hospitalist on call if the hospitalist that took care of you is not available. Once you are discharged, your primary care physician will handle any further medical issues. Please note that no refills for any discharge medications will be authorized once you are discharged, as it is imperative that you return to your primary care physician (or establish a relationship with a primary care physician if you do not have one) for your aftercare needs so that they can reassess your need for medications and monitor your lab values.  You were cared for by a hospitalist during your hospital stay. If you have any questions about your discharge medications or the care you received while you were in the hospital after you are discharged, you can call the unit and asked to speak with the hospitalist on call if the hospitalist that took care of you is not available. Once you are discharged,  your primary care physician will handle any further medical issues. Please note that NO REFILLS for any discharge medications will be authorized once you are discharged, as it is imperative that you return to your primary care physician (or establish a relationship with a primary care physician if you do not have one) for your aftercare needs so that they can reassess your need for medications and monitor your lab values.  Please request your Prim.MD to go over all Hospital Tests and Procedure/Radiological results at the follow up, please get all Hospital records sent to your Prim MD by signing hospital release before you go home.  Get CBC, CMP, 2 view Chest X ray checked  by Primary MD during your next visit or SNF MD in 5-7 days ( we routinely change or add medications that can affect your baseline labs and fluid status, therefore we recommend that you get the mentioned basic workup next visit with  your PCP, your PCP may decide not to get them or add new tests based on their clinical decision)  On your next visit with your primary care physician please Get Medicines reviewed and adjusted.  If you experience worsening of your admission symptoms, develop shortness of breath, life threatening emergency, suicidal or homicidal thoughts you must seek medical attention immediately by calling 911 or calling your MD immediately  if symptoms less severe.  You Must read complete instructions/literature along with all the possible adverse reactions/side effects for all the Medicines you take and that have been prescribed to you. Take any new Medicines after you have completely understood and accpet all the possible adverse reactions/side effects.   Do not drive, operate heavy machinery, perform activities at heights, swimming or participation in water activities or provide baby sitting services if your were admitted for syncope or siezures until you have seen by Primary MD or a Neurologist and advised to do so  again.  Do not drive when taking Pain medications.   Procedures/Studies: No results found.   The results of significant diagnostics from this hospitalization (including imaging, microbiology, ancillary and laboratory) are listed below for reference.     Microbiology: No results found for this or any previous visit (from the past 240 hour(s)).   Labs: BNP (last 3 results) No results for input(s): "BNP" in the last 8760 hours. Basic Metabolic Panel: Recent Labs  Lab 03/16/23 1417 03/16/23 1657 03/18/23 0557  NA 138 137 137  K 3.9 3.9 3.5  CL 107 108 109  CO2 24 22 23   GLUCOSE 110* 96 105*  BUN 13 14 12   CREATININE 0.69 0.74 0.75  CALCIUM 9.2 9.0 8.5*  MG  --   --  2.0   Liver Function Tests: Recent Labs  Lab 03/16/23 1417  AST 13*  ALT 8  ALKPHOS 106  BILITOT 0.3  PROT 6.1*  ALBUMIN 3.7   No results for input(s): "LIPASE", "AMYLASE" in the last 168 hours. No results for input(s): "AMMONIA" in the last 168 hours. CBC: Recent Labs  Lab 03/16/23 1417 03/16/23 1417 03/16/23 1657 03/17/23 0058 03/17/23 0801 03/17/23 2133 03/18/23 0557 03/18/23 0558  WBC 5.1  --  5.0  --   --   --  5.9  --   NEUTROABS 3.6  --  3.3  --   --   --   --   --   HGB 6.1*   < > 6.1* 7.2* 7.6* 7.7* 7.6* 7.5*  HCT 21.8*  --  22.6* 24.2* 25.9* 26.7* 26.4* 26.1*  MCV 74.9*  --  76.1*  --   --   --  77.4*  --   PLT 355  --  347  --   --   --  309  --    < > = values in this interval not displayed.   Cardiac Enzymes: No results for input(s): "CKTOTAL", "CKMB", "CKMBINDEX", "TROPONINI" in the last 168 hours. BNP: Invalid input(s): "POCBNP" CBG: No results for input(s): "GLUCAP" in the last 168 hours. D-Dimer No results for input(s): "DDIMER" in the last 72 hours. Hgb A1c No results for input(s): "HGBA1C" in the last 72 hours. Lipid Profile No results for input(s): "CHOL", "HDL", "LDLCALC", "TRIG", "CHOLHDL", "LDLDIRECT" in the last 72 hours. Thyroid function studies No results  for input(s): "TSH", "T4TOTAL", "T3FREE", "THYROIDAB" in the last 72 hours.  Invalid input(s): "FREET3" Anemia work up Recent Labs    03/16/23 1417  FERRITIN 3*  TIBC 519*  IRON 10*   Urinalysis    Component Value Date/Time   COLORURINE YELLOW 11/11/2021 1312   APPEARANCEUR CLEAR 11/11/2021 1312   LABSPEC 1.012 11/11/2021 1312   PHURINE 7.0 11/11/2021 1312   GLUCOSEU NEGATIVE 11/11/2021 1312   HGBUR SMALL (A) 11/11/2021 1312   BILIRUBINUR NEGATIVE 11/11/2021 1312   KETONESUR NEGATIVE 11/11/2021 1312   PROTEINUR NEGATIVE 11/11/2021 1312   UROBILINOGEN 0.2 12/26/2019 1226   NITRITE NEGATIVE 11/11/2021 1312   LEUKOCYTESUR SMALL (A) 11/11/2021 1312   Sepsis Labs Recent Labs  Lab 03/16/23 1417 03/16/23 1657 03/18/23 0557  WBC 5.1 5.0 5.9   Microbiology No results found for this or any previous visit (from the past 240 hour(s)).   Time coordinating discharge:  I have spent 35 minutes face to face with the patient and on the ward discussing the patients care, assessment, plan and disposition with other care givers. >50% of the time was devoted counseling the patient about the risks and benefits of treatment/Discharge disposition and coordinating care.   SIGNED:   Dimple Nanas, MD  [facility] 03/18/2023, 12:14 PM   If 7PM-7AM, please contact night-coverage

## 2023-03-18 NOTE — Progress Notes (Signed)
Patient discharged to home with family, discharge instructions reviewed with patient who verbalized understanding. 

## 2023-03-19 LAB — TYPE AND SCREEN
ABO/RH(D): O POS
Antibody Screen: NEGATIVE
Unit division: 0
Unit division: 0

## 2023-03-19 LAB — BPAM RBC
Blood Product Expiration Date: 202407012359
Blood Product Expiration Date: 202407182359
ISSUE DATE / TIME: 202406181803
ISSUE DATE / TIME: 202406201127
Unit Type and Rh: 5100

## 2023-03-22 ENCOUNTER — Encounter (HOSPITAL_COMMUNITY): Payer: Self-pay | Admitting: Gastroenterology

## 2023-03-22 DIAGNOSIS — T148XXA Other injury of unspecified body region, initial encounter: Secondary | ICD-10-CM | POA: Diagnosis not present

## 2023-03-22 DIAGNOSIS — M5136 Other intervertebral disc degeneration, lumbar region: Secondary | ICD-10-CM | POA: Diagnosis not present

## 2023-03-22 DIAGNOSIS — R030 Elevated blood-pressure reading, without diagnosis of hypertension: Secondary | ICD-10-CM | POA: Diagnosis not present

## 2023-03-22 DIAGNOSIS — D509 Iron deficiency anemia, unspecified: Secondary | ICD-10-CM | POA: Diagnosis not present

## 2023-03-22 DIAGNOSIS — K253 Acute gastric ulcer without hemorrhage or perforation: Secondary | ICD-10-CM | POA: Diagnosis not present

## 2023-03-22 DIAGNOSIS — R300 Dysuria: Secondary | ICD-10-CM | POA: Diagnosis not present

## 2023-03-22 DIAGNOSIS — Z6824 Body mass index (BMI) 24.0-24.9, adult: Secondary | ICD-10-CM | POA: Diagnosis not present

## 2023-03-22 DIAGNOSIS — M5134 Other intervertebral disc degeneration, thoracic region: Secondary | ICD-10-CM | POA: Diagnosis not present

## 2023-03-22 DIAGNOSIS — E559 Vitamin D deficiency, unspecified: Secondary | ICD-10-CM | POA: Diagnosis not present

## 2023-03-22 DIAGNOSIS — M4184 Other forms of scoliosis, thoracic region: Secondary | ICD-10-CM | POA: Diagnosis not present

## 2023-03-22 DIAGNOSIS — M4156 Other secondary scoliosis, lumbar region: Secondary | ICD-10-CM | POA: Diagnosis not present

## 2023-03-22 DIAGNOSIS — D649 Anemia, unspecified: Secondary | ICD-10-CM | POA: Diagnosis not present

## 2023-03-25 DIAGNOSIS — M4156 Other secondary scoliosis, lumbar region: Secondary | ICD-10-CM | POA: Diagnosis not present

## 2023-03-25 DIAGNOSIS — R300 Dysuria: Secondary | ICD-10-CM | POA: Diagnosis not present

## 2023-03-25 DIAGNOSIS — D509 Iron deficiency anemia, unspecified: Secondary | ICD-10-CM | POA: Diagnosis not present

## 2023-03-25 DIAGNOSIS — N390 Urinary tract infection, site not specified: Secondary | ICD-10-CM | POA: Diagnosis not present

## 2023-03-25 DIAGNOSIS — D649 Anemia, unspecified: Secondary | ICD-10-CM | POA: Diagnosis not present

## 2023-03-25 DIAGNOSIS — M5134 Other intervertebral disc degeneration, thoracic region: Secondary | ICD-10-CM | POA: Diagnosis not present

## 2023-03-25 DIAGNOSIS — E559 Vitamin D deficiency, unspecified: Secondary | ICD-10-CM | POA: Diagnosis not present

## 2023-03-25 DIAGNOSIS — M5136 Other intervertebral disc degeneration, lumbar region: Secondary | ICD-10-CM | POA: Diagnosis not present

## 2023-03-25 DIAGNOSIS — T148XXA Other injury of unspecified body region, initial encounter: Secondary | ICD-10-CM | POA: Diagnosis not present

## 2023-03-25 DIAGNOSIS — Z9181 History of falling: Secondary | ICD-10-CM | POA: Diagnosis not present

## 2023-03-25 DIAGNOSIS — M4184 Other forms of scoliosis, thoracic region: Secondary | ICD-10-CM | POA: Diagnosis not present

## 2023-03-25 DIAGNOSIS — Z79899 Other long term (current) drug therapy: Secondary | ICD-10-CM | POA: Diagnosis not present

## 2023-04-06 DIAGNOSIS — K449 Diaphragmatic hernia without obstruction or gangrene: Secondary | ICD-10-CM | POA: Diagnosis not present

## 2023-04-06 DIAGNOSIS — D509 Iron deficiency anemia, unspecified: Secondary | ICD-10-CM | POA: Diagnosis not present

## 2023-04-06 DIAGNOSIS — K5904 Chronic idiopathic constipation: Secondary | ICD-10-CM | POA: Diagnosis not present

## 2023-04-06 DIAGNOSIS — Z86010 Personal history of colonic polyps: Secondary | ICD-10-CM | POA: Diagnosis not present

## 2023-04-06 DIAGNOSIS — K219 Gastro-esophageal reflux disease without esophagitis: Secondary | ICD-10-CM | POA: Diagnosis not present

## 2023-04-06 DIAGNOSIS — K259 Gastric ulcer, unspecified as acute or chronic, without hemorrhage or perforation: Secondary | ICD-10-CM | POA: Diagnosis not present

## 2023-04-08 ENCOUNTER — Telehealth: Payer: Self-pay | Admitting: *Deleted

## 2023-04-08 ENCOUNTER — Inpatient Hospital Stay: Payer: HMO | Attending: Hematology and Oncology | Admitting: Adult Health

## 2023-04-08 NOTE — Telephone Encounter (Signed)
Attempted to call patient regarding missed appt, no answer. Message sent to scheduler to reschedule

## 2023-04-12 ENCOUNTER — Telehealth: Payer: Self-pay | Admitting: Adult Health

## 2023-04-12 NOTE — Telephone Encounter (Signed)
Left message regarding next upcoming appointment times/dates

## 2023-04-21 DIAGNOSIS — Z9181 History of falling: Secondary | ICD-10-CM | POA: Diagnosis not present

## 2023-04-21 DIAGNOSIS — D649 Anemia, unspecified: Secondary | ICD-10-CM | POA: Diagnosis not present

## 2023-04-21 DIAGNOSIS — Z79899 Other long term (current) drug therapy: Secondary | ICD-10-CM | POA: Diagnosis not present

## 2023-04-21 DIAGNOSIS — M5134 Other intervertebral disc degeneration, thoracic region: Secondary | ICD-10-CM | POA: Diagnosis not present

## 2023-04-21 DIAGNOSIS — M5136 Other intervertebral disc degeneration, lumbar region: Secondary | ICD-10-CM | POA: Diagnosis not present

## 2023-04-21 DIAGNOSIS — M4156 Other secondary scoliosis, lumbar region: Secondary | ICD-10-CM | POA: Diagnosis not present

## 2023-04-21 DIAGNOSIS — R030 Elevated blood-pressure reading, without diagnosis of hypertension: Secondary | ICD-10-CM | POA: Diagnosis not present

## 2023-04-21 DIAGNOSIS — E559 Vitamin D deficiency, unspecified: Secondary | ICD-10-CM | POA: Diagnosis not present

## 2023-04-21 DIAGNOSIS — M4184 Other forms of scoliosis, thoracic region: Secondary | ICD-10-CM | POA: Diagnosis not present

## 2023-04-21 DIAGNOSIS — D509 Iron deficiency anemia, unspecified: Secondary | ICD-10-CM | POA: Diagnosis not present

## 2023-05-10 ENCOUNTER — Other Ambulatory Visit: Payer: Self-pay | Admitting: Adult Health

## 2023-05-10 ENCOUNTER — Inpatient Hospital Stay: Payer: HMO | Attending: Hematology and Oncology | Admitting: Adult Health

## 2023-05-10 ENCOUNTER — Inpatient Hospital Stay: Payer: HMO

## 2023-05-10 ENCOUNTER — Other Ambulatory Visit: Payer: Self-pay

## 2023-05-10 VITALS — BP 147/80 | HR 75 | Temp 97.7°F | Resp 16 | Wt 98.6 lb

## 2023-05-10 DIAGNOSIS — D500 Iron deficiency anemia secondary to blood loss (chronic): Secondary | ICD-10-CM | POA: Diagnosis not present

## 2023-05-10 DIAGNOSIS — D509 Iron deficiency anemia, unspecified: Secondary | ICD-10-CM | POA: Diagnosis not present

## 2023-05-10 DIAGNOSIS — D649 Anemia, unspecified: Secondary | ICD-10-CM

## 2023-05-10 LAB — CMP (CANCER CENTER ONLY)
ALT: 8 U/L (ref 0–44)
AST: 14 U/L — ABNORMAL LOW (ref 15–41)
Albumin: 4.2 g/dL (ref 3.5–5.0)
Alkaline Phosphatase: 99 U/L (ref 38–126)
Anion gap: 9 (ref 5–15)
BUN: 9 mg/dL (ref 8–23)
CO2: 27 mmol/L (ref 22–32)
Calcium: 9.9 mg/dL (ref 8.9–10.3)
Chloride: 105 mmol/L (ref 98–111)
Creatinine: 0.74 mg/dL (ref 0.44–1.00)
GFR, Estimated: >60 mL/min (ref >60)
Glucose, Bld: 93 mg/dL (ref 70–99)
Potassium: 3.7 mmol/L (ref 3.5–5.1)
Sodium: 141 mmol/L (ref 135–145)
Total Bilirubin: 0.6 mg/dL (ref 0.3–1.2)
Total Protein: 7.4 g/dL (ref 6.5–8.1)

## 2023-05-10 LAB — CBC WITH DIFFERENTIAL (CANCER CENTER ONLY)
Abs Immature Granulocytes: 0.02 10*3/uL (ref 0.00–0.07)
Basophils Absolute: 0 10*3/uL (ref 0.0–0.1)
Basophils Relative: 1 %
Eosinophils Absolute: 0.1 10*3/uL (ref 0.0–0.5)
Eosinophils Relative: 1 %
HCT: 39.5 % (ref 36.0–46.0)
Hemoglobin: 12.6 g/dL (ref 12.0–15.0)
Immature Granulocytes: 0 %
Lymphocytes Relative: 15 %
Lymphs Abs: 1 10*3/uL (ref 0.7–4.0)
MCH: 26 pg (ref 26.0–34.0)
MCHC: 31.9 g/dL (ref 30.0–36.0)
MCV: 81.4 fL (ref 80.0–100.0)
Monocytes Absolute: 0.5 10*3/uL (ref 0.1–1.0)
Monocytes Relative: 7 %
Neutro Abs: 5 10*3/uL (ref 1.7–7.7)
Neutrophils Relative %: 76 %
Platelet Count: 290 10*3/uL (ref 150–400)
RBC: 4.85 MIL/uL (ref 3.87–5.11)
RDW: 23.5 % — ABNORMAL HIGH (ref 11.5–15.5)
WBC Count: 6.5 10*3/uL (ref 4.0–10.5)
nRBC: 0 % (ref 0.0–0.2)

## 2023-05-10 LAB — SAMPLE TO BLOOD BANK

## 2023-05-10 LAB — IRON AND IRON BINDING CAPACITY (CC-WL,HP ONLY)
Iron: 59 ug/dL (ref 28–170)
Saturation Ratios: 17 % (ref 10.4–31.8)
TIBC: 343 ug/dL (ref 250–450)
UIBC: 284 ug/dL (ref 148–442)

## 2023-05-10 NOTE — Progress Notes (Unsigned)
[facility] Cancer Follow up:    Saundra Shelling, NP [address] [place] [address]   DIAGNOSIS: Iron deficiency anemia  SUMMARY OF HEMATOLOGIC HISTORY: Diagnosed with iron deficiency anemia in June 2024 secondary to bleeding ulcers.  She was admitted and received IV iron during admission and was also started on Protonix daily.  CURRENT THERAPY: intermittent IV iron  INTERVAL HISTORY: Damary ZOEII REINIG 77 y.o. female returns for follow up of her iron deficiency anemia.  She has been doing moderately well and denies any significant issues.     Patient Active Problem List   Diagnosis Date Noted   Symptomatic anemia 03/16/2023   Palliative care encounter 12/30/2021   FTT (failure to thrive) in adult 11/13/2021   GI bleed 11/13/2021   Acute lower GI bleeding 11/11/2021   Acute blood loss anemia 11/11/2021   Melena 11/11/2021   Seizure disorder (HCC) 10/27/2021   Protein-calorie malnutrition, severe 08/26/2021   Failure to thrive in adult 08/23/2021   Right 10th rib fracture 08/23/2021   Physical deconditioning 08/23/2021   Bad headache 03/14/2021   Seizure (HCC) 03/06/2021   CVA (cerebral vascular accident) (HCC) 03/06/2021   Ischemic cerebrovascular accident (CVA) (HCC) 03/05/2021   Hypokalemia 03/05/2021   Anxiety 03/05/2021   Transient speech disturbance 03/05/2021   Lung nodule 03/05/2021   Aphasia    Fall    Anemia 02/28/2020   Shortness of breath 01/24/2020   Dizziness 01/24/2020   Herpes 10/04/2018   Cerebral aneurysm 09/05/2018   NSAID long-term use 04/20/2016   Scoliosis 04/20/2016   Depression 04/20/2016   HTN (hypertension) 04/20/2016   GERD (gastroesophageal reflux disease) 04/20/2016   Occult GI bleeding 04/20/2016    is allergic to sulfa antibiotics and tramadol.  MEDICAL HISTORY: Past Medical History:  Diagnosis Date   Anemia    years ago   Aneurysm (HCC)    right PICA aneurysm and stent   Aphasia    Arthritis     Basal cell carcinoma of eye    biopsy of left eye/ non cancerous   Chronic back pain    Complication of anesthesia    nausea/vomiting   Depression    Failure to thrive in adult    GERD (gastroesophageal reflux disease)    GI bleed 10/2021   acute GI bleed with gastric erosion   Headache    Herpes    History of hiatal hernia 10/2021   5 cm hiatal hernia   History of kidney stones    Hypertension    no longer on medications   Hypokalemia    Left anterior fascicular block 11/11/2021   noted on EKG   Scoliosis    Seizures (HCC)    Manifesting with aphasia   Severe protein-calorie malnutrition (HCC)    Stroke [facility])     SURGICAL HISTORY: Past Surgical History:  Procedure Laterality Date   BIOPSY  03/17/2023   Procedure: BIOPSY;  Surgeon: Jeani Hawking, MD;  Location: [facility]COPY;  Service: Gastroenterology;;   CEREBRAL ANEURYSM REPAIR     1998   COLONOSCOPY     ECTOPIC PREGNANCY SURGERY     x2    ENTEROSCOPY N/A 03/01/2020   Procedure: ENTEROSCOPY;  Surgeon: Jeani Hawking, MD;  Location: [facility];  Service: Endoscopy;  Laterality: N/A;   ENTEROSCOPY N/A 11/14/2021   Procedure: ENTEROSCOPY;  Surgeon: Jeani Hawking, MD;  Location: [facility];  Service: Endoscopy;  Laterality: N/A;   ENTEROSCOPY N/A 03/17/2023   Procedure: ENTEROSCOPY;  Surgeon: Jeani Hawking, MD;  Location: [facility];  Service: Gastroenterology;  Laterality: N/A;   ESOPHAGOGASTRODUODENOSCOPY N/A 04/22/2016   Procedure: ESOPHAGOGASTRODUODENOSCOPY (EGD);  Surgeon: Charna Elizabeth, MD;  Location: [facility];  Service: Endoscopy;  Laterality: N/A;   ESOPHAGOGASTRODUODENOSCOPY (EGD) WITH PROPOFOL N/A 11/12/2021   Procedure: ESOPHAGOGASTRODUODENOSCOPY (EGD) WITH PROPOFOL;  Surgeon: Jeani Hawking, MD;  Location: [facility];  Service: Endoscopy;  Laterality: N/A;   FEMORAL HERNIA REPAIR Right 07/02/2022   Procedure: Laparoscopic right femoral hernia with mesh;  Surgeon: Axel Filler, MD;  Location: [facility] ORS;   Service: General;  Laterality: Right;   GIVENS CAPSULE STUDY N/A 11/12/2021   Procedure: GIVENS CAPSULE STUDY;  Surgeon: Jeani Hawking, MD;  Location: [facility];  Service: Endoscopy;  Laterality: N/A;   IR 3D INDEPENDENT WKST  09/05/2018   IR ANGIO INTRA EXTRACRAN SEL COM CAROTID INNOMINATE BILAT MOD SED  09/05/2018   IR ANGIO VERTEBRAL SEL VERTEBRAL UNI R MOD SED  09/05/2018   IR ANGIOGRAM FOLLOW UP STUDY  09/05/2018   IR RADIOLOGIST EVAL & MGMT  03/28/2021   IR TRANSCATH/EMBOLIZ  09/05/2018   PARATHYROIDECTOMY     2002   RADIOLOGY WITH ANESTHESIA N/A 09/05/2018   Procedure: EMBOLIZATION;  Surgeon: Radiologist, Medication, MD;  Location: [facility] OR;  Service: Radiology;  Laterality: N/A;   TONSILLECTOMY     1953   VAGINAL HYSTERECTOMY     2001   VOCAL CORD LATERALIZATION, ENDOSCOPIC APPROACH W/ MLB     2007 (@[facility] )   WRIST SURGERY     left side/2008   WRIST SURGERY     right side/ 2002    SOCIAL HISTORY: Social History   Socioeconomic History   Marital status: Divorced    Spouse name: Not on file   Number of children: Not on file   Years of education: Not on file   Highest education level: Not on file  Occupational History   Not on file  Tobacco Use   Smoking status: Former    Types: Cigarettes   Smokeless tobacco: Never  Vaping Use   Vaping status: Never Used  Substance and Sexual Activity   Alcohol use: No   Drug use: Never   Sexual activity: Not Currently  Other Topics Concern   Not on file  Social History Narrative   Not on file   Social Determinants of Health   Financial Resource Strain: Not on file  Food Insecurity: No Food Insecurity (03/17/2023)   Hunger Vital Sign    Worried About Running Out of Food in the Last Year: Never true    Ran Out of Food in the Last Year: Never true  Transportation Needs: No Transportation Needs (03/17/2023)   PRAPARE - Administrator, Civil Service (Medical): No    Lack of Transportation (Non-Medical): No  Physical  Activity: Not on file  Stress: Not on file  Social Connections: Unknown (01/30/2022)   [facility] from [facility]   Social Network    Social Network: Not on file  Intimate Partner Violence: Not At Risk (03/17/2023)   Humiliation, Afraid, Rape, and Kick questionnaire    Fear of Current or Ex-Partner: No    Emotionally Abused: No    Physically Abused: No    Sexually Abused: No    FAMILY HISTORY: Family History  Problem Relation Age of Onset   Cancer Mother    Suicidality Father     Review of Systems  Constitutional:  Positive for fatigue. Negative for appetite change, chills,  fever and unexpected weight change.  HENT:   Negative for hearing loss, lump/mass and trouble swallowing.   Eyes:  Negative for eye problems and icterus.  Respiratory:  Negative for chest tightness, cough and shortness of breath.   Cardiovascular:  Negative for chest pain, leg swelling and palpitations.  Gastrointestinal:  Negative for abdominal distention, abdominal pain, constipation, diarrhea, nausea and vomiting.  Endocrine: Negative for hot flashes.  Genitourinary:  Negative for difficulty urinating.   Musculoskeletal:  Negative for arthralgias.  Skin:  Negative for itching and rash.  Neurological:  Negative for dizziness, extremity weakness, headaches and numbness.  Hematological:  Negative for adenopathy. Does not bruise/bleed easily.  Psychiatric/Behavioral:  Negative for depression. The patient is not nervous/anxious.       PHYSICAL EXAMINATION    Vitals:   05/10/23 1424  BP: (!) 147/80  Pulse: 75  Resp: 16  Temp: 97.7 F (36.5 C)  SpO2: 98%    Physical Exam Constitutional:      General: She is not in acute distress.    Appearance: Normal appearance. She is not toxic-appearing.  HENT:     Head: Normocephalic and atraumatic.     Mouth/Throat:     Mouth: Mucous membranes are moist.     Pharynx: Oropharynx is clear. No oropharyngeal exudate or posterior oropharyngeal  erythema.  Eyes:     General: No scleral icterus. Cardiovascular:     Rate and Rhythm: Normal rate and regular rhythm.     Pulses: Normal pulses.     Heart sounds: Normal heart sounds.  Pulmonary:     Effort: Pulmonary effort is normal.     Breath sounds: Normal breath sounds.  Abdominal:     General: Abdomen is flat. Bowel sounds are normal. There is no distension.     Palpations: Abdomen is soft.     Tenderness: There is no abdominal tenderness.  Musculoskeletal:        General: No swelling.     Cervical back: Neck supple.  Lymphadenopathy:     Cervical: No cervical adenopathy.  Skin:    General: Skin is warm and dry.     Findings: No rash.  Neurological:     General: No focal deficit present.     Mental Status: She is alert.  Psychiatric:        Mood and Affect: Mood normal.        Behavior: Behavior normal.     LABORATORY DATA:  CBC    Component Value Date/Time   WBC 6.5 05/10/2023 1507   WBC 5.9 03/18/2023 0557   RBC 4.85 05/10/2023 1507   HGB 12.6 05/10/2023 1507   HCT 39.5 05/10/2023 1507   PLT 290 05/10/2023 1507   MCV 81.4 05/10/2023 1507   MCH 26.0 05/10/2023 1507   MCHC 31.9 05/10/2023 1507   RDW 23.5 (H) 05/10/2023 1507   LYMPHSABS 1.0 05/10/2023 1507   MONOABS 0.5 05/10/2023 1507   EOSABS 0.1 05/10/2023 1507   BASOSABS 0.0 05/10/2023 1507    CMP     Component Value Date/Time   NA 141 05/10/2023 1507   K 3.7 05/10/2023 1507   CL 105 05/10/2023 1507   CO2 27 05/10/2023 1507   GLUCOSE 93 05/10/2023 1507   BUN 9 05/10/2023 1507   CREATININE 0.74 05/10/2023 1507   CALCIUM 9.9 05/10/2023 1507   PROT 7.4 05/10/2023 1507   ALBUMIN 4.2 05/10/2023 1507   AST 14 (L) 05/10/2023 1507   ALT 8 05/10/2023 1507  ALKPHOS 99 05/10/2023 1507   BILITOT 0.6 05/10/2023 1507   GFRNONAA >60 05/10/2023 1507   GFRAA >60 03/01/2020 0525       ASSESSMENT and THERAPY PLAN:   Anemia Terika is a 77 year old woman with iron deficiency anemia here today  for evaluation.   She is feeling moderately well.  We are waiting on her iron studies to result and that will happen tomorrow morning.    RTC in 12 weeks for labs and f/u with Dr. Al Pimple    All questions were answered. The patient knows to call the clinic with any problems, questions or concerns. We can certainly see the patient much sooner if necessary.  Total encounter time:20 minutes*in face-to-face visit time, chart review, lab review, care coordination, order entry, and documentation of the encounter time.    Lillard Anes, NP 05/12/23 10:12 PM Medical Oncology and Hematology [facility] [address] [place], [place] [address] Tel. [phone]    Fax. [phone]  *Total Encounter Time as defined by the Centers for Medicare and Medicaid Services includes, in addition to the face-to-face time of a patient visit (documented in the note above) non-face-to-face time: obtaining and reviewing outside history, ordering and reviewing medications, tests or procedures, care coordination (communications with other health care professionals or caregivers) and documentation in the medical record.

## 2023-05-11 ENCOUNTER — Telehealth: Payer: Self-pay | Admitting: Adult Health

## 2023-05-11 NOTE — Telephone Encounter (Signed)
Scheduled appointments per 8/12 los. Left voicemail with appointment details.

## 2023-05-12 NOTE — Assessment & Plan Note (Signed)
Elaine Rivera is a 77 year old woman with iron deficiency anemia here today for evaluation.   She is feeling moderately well.  We are waiting on her iron studies to result and that will happen tomorrow morning.    RTC in 12 weeks for labs and f/u with Dr. Al Pimple

## 2023-05-20 DIAGNOSIS — D649 Anemia, unspecified: Secondary | ICD-10-CM | POA: Diagnosis not present

## 2023-05-20 DIAGNOSIS — Z9181 History of falling: Secondary | ICD-10-CM | POA: Diagnosis not present

## 2023-05-20 DIAGNOSIS — E559 Vitamin D deficiency, unspecified: Secondary | ICD-10-CM | POA: Diagnosis not present

## 2023-05-20 DIAGNOSIS — D509 Iron deficiency anemia, unspecified: Secondary | ICD-10-CM | POA: Diagnosis not present

## 2023-05-20 DIAGNOSIS — M5136 Other intervertebral disc degeneration, lumbar region: Secondary | ICD-10-CM | POA: Diagnosis not present

## 2023-05-20 DIAGNOSIS — Z79899 Other long term (current) drug therapy: Secondary | ICD-10-CM | POA: Diagnosis not present

## 2023-05-20 DIAGNOSIS — M4184 Other forms of scoliosis, thoracic region: Secondary | ICD-10-CM | POA: Diagnosis not present

## 2023-05-20 DIAGNOSIS — M5134 Other intervertebral disc degeneration, thoracic region: Secondary | ICD-10-CM | POA: Diagnosis not present

## 2023-05-20 DIAGNOSIS — M4156 Other secondary scoliosis, lumbar region: Secondary | ICD-10-CM | POA: Diagnosis not present

## 2023-05-24 DIAGNOSIS — Z79899 Other long term (current) drug therapy: Secondary | ICD-10-CM | POA: Diagnosis not present

## 2023-06-18 DIAGNOSIS — D509 Iron deficiency anemia, unspecified: Secondary | ICD-10-CM | POA: Diagnosis not present

## 2023-06-18 DIAGNOSIS — M5136 Other intervertebral disc degeneration, lumbar region: Secondary | ICD-10-CM | POA: Diagnosis not present

## 2023-06-18 DIAGNOSIS — M5134 Other intervertebral disc degeneration, thoracic region: Secondary | ICD-10-CM | POA: Diagnosis not present

## 2023-06-18 DIAGNOSIS — Z79899 Other long term (current) drug therapy: Secondary | ICD-10-CM | POA: Diagnosis not present

## 2023-06-18 DIAGNOSIS — M4156 Other secondary scoliosis, lumbar region: Secondary | ICD-10-CM | POA: Diagnosis not present

## 2023-06-18 DIAGNOSIS — E559 Vitamin D deficiency, unspecified: Secondary | ICD-10-CM | POA: Diagnosis not present

## 2023-06-18 DIAGNOSIS — D539 Nutritional anemia, unspecified: Secondary | ICD-10-CM | POA: Diagnosis not present

## 2023-06-18 DIAGNOSIS — M4184 Other forms of scoliosis, thoracic region: Secondary | ICD-10-CM | POA: Diagnosis not present

## 2023-07-19 DIAGNOSIS — D509 Iron deficiency anemia, unspecified: Secondary | ICD-10-CM | POA: Diagnosis not present

## 2023-07-19 DIAGNOSIS — M5134 Other intervertebral disc degeneration, thoracic region: Secondary | ICD-10-CM | POA: Diagnosis not present

## 2023-07-19 DIAGNOSIS — M51369 Other intervertebral disc degeneration, lumbar region without mention of lumbar back pain or lower extremity pain: Secondary | ICD-10-CM | POA: Diagnosis not present

## 2023-07-19 DIAGNOSIS — Z23 Encounter for immunization: Secondary | ICD-10-CM | POA: Diagnosis not present

## 2023-07-19 DIAGNOSIS — M4156 Other secondary scoliosis, lumbar region: Secondary | ICD-10-CM | POA: Diagnosis not present

## 2023-07-19 DIAGNOSIS — E559 Vitamin D deficiency, unspecified: Secondary | ICD-10-CM | POA: Diagnosis not present

## 2023-07-19 DIAGNOSIS — M4184 Other forms of scoliosis, thoracic region: Secondary | ICD-10-CM | POA: Diagnosis not present

## 2023-08-03 ENCOUNTER — Inpatient Hospital Stay: Payer: HMO | Admitting: Hematology and Oncology

## 2023-08-03 ENCOUNTER — Inpatient Hospital Stay: Payer: HMO | Attending: Hematology and Oncology

## 2023-09-06 DIAGNOSIS — M545 Low back pain, unspecified: Secondary | ICD-10-CM | POA: Diagnosis not present

## 2023-10-26 ENCOUNTER — Telehealth: Payer: Self-pay | Admitting: *Deleted

## 2023-10-26 NOTE — Telephone Encounter (Signed)
This RN was informed by scheduler that a friend of the pt is present (not the patient) requesting an appt asap due to "she is not doing well at all".  Noted pt was last seen in Aug 2024 and then failed to keep her appt in Nov. 2024.   Pt has hx of anemia.  Last heme in Aug was 12.2.  This RN informed scheduler to schedule pt for next available - and this RN will call pt to follow up.  This RN called pt with no answer on mobile number - called home number with noted message stating mailbox if full.  This RN then called the patient's daughter per concern.  She states she was recently with her mother on this past Sunday with mother showing some weakness.  The daughter asked" so mom did not reschedule her missed visit ?"  This RN informed her she has not and informed of next available for this coming Monday.  This RN also discussed concern per friend-of symptoms which may need for pt to proceed to the ER for more urgent care.  Pt's daughter verbalized understanding of above - date of appt,location, MD this RN's name.

## 2023-10-27 ENCOUNTER — Other Ambulatory Visit: Payer: Self-pay

## 2023-10-27 ENCOUNTER — Emergency Department (HOSPITAL_COMMUNITY)
Admission: EM | Admit: 2023-10-27 | Discharge: 2023-10-27 | Disposition: A | Discharge status: Home / Self Care | Payer: Medicare PPO | Attending: Student | Admitting: Student

## 2023-10-27 DIAGNOSIS — R5383 Other fatigue: Secondary | ICD-10-CM | POA: Diagnosis present

## 2023-10-27 DIAGNOSIS — Z79899 Other long term (current) drug therapy: Secondary | ICD-10-CM | POA: Diagnosis not present

## 2023-10-27 DIAGNOSIS — Z87891 Personal history of nicotine dependence: Secondary | ICD-10-CM | POA: Insufficient documentation

## 2023-10-27 DIAGNOSIS — Z0000 Encounter for general adult medical examination without abnormal findings: Secondary | ICD-10-CM

## 2023-10-27 DIAGNOSIS — Z85828 Personal history of other malignant neoplasm of skin: Secondary | ICD-10-CM | POA: Insufficient documentation

## 2023-10-27 DIAGNOSIS — I10 Essential (primary) hypertension: Secondary | ICD-10-CM | POA: Insufficient documentation

## 2023-10-27 LAB — CBC WITH DIFFERENTIAL/PLATELET
Abs Immature Granulocytes: 0.02 10*3/uL (ref 0.00–0.07)
Basophils Absolute: 0 10*3/uL (ref 0.0–0.1)
Basophils Relative: 0 %
Eosinophils Absolute: 0 10*3/uL (ref 0.0–0.5)
Eosinophils Relative: 1 %
HCT: 47 % — ABNORMAL HIGH (ref 36.0–46.0)
Hemoglobin: 15.1 g/dL — ABNORMAL HIGH (ref 12.0–15.0)
Immature Granulocytes: 0 %
Lymphocytes Relative: 15 %
Lymphs Abs: 1.2 10*3/uL (ref 0.7–4.0)
MCH: 29.4 pg (ref 26.0–34.0)
MCHC: 32.1 g/dL (ref 30.0–36.0)
MCV: 91.4 fL (ref 80.0–100.0)
Monocytes Absolute: 0.5 10*3/uL (ref 0.1–1.0)
Monocytes Relative: 7 %
Neutro Abs: 5.9 10*3/uL (ref 1.7–7.7)
Neutrophils Relative %: 77 %
Platelets: 301 10*3/uL (ref 150–400)
RBC: 5.14 MIL/uL — ABNORMAL HIGH (ref 3.87–5.11)
RDW: 13.2 % (ref 11.5–15.5)
WBC: 7.7 10*3/uL (ref 4.0–10.5)
nRBC: 0 % (ref 0.0–0.2)

## 2023-10-27 LAB — COMPREHENSIVE METABOLIC PANEL
ALT: 11 U/L (ref 0–44)
AST: 23 U/L (ref 15–41)
Albumin: 4.2 g/dL (ref 3.5–5.0)
Alkaline Phosphatase: 109 U/L (ref 38–126)
Anion gap: 12 (ref 5–15)
BUN: 12 mg/dL (ref 8–23)
CO2: 21 mmol/L — ABNORMAL LOW (ref 22–32)
Calcium: 10.1 mg/dL (ref 8.9–10.3)
Chloride: 106 mmol/L (ref 98–111)
Creatinine, Ser: 0.8 mg/dL (ref 0.44–1.00)
GFR, Estimated: >60 mL/min (ref >60)
Glucose, Bld: 168 mg/dL — ABNORMAL HIGH (ref 70–99)
Potassium: 3.2 mmol/L — ABNORMAL LOW (ref 3.5–5.1)
Sodium: 139 mmol/L (ref 135–145)
Total Bilirubin: 1.1 mg/dL (ref 0.0–1.2)
Total Protein: 7.9 g/dL (ref 6.5–8.1)

## 2023-10-27 LAB — TYPE AND SCREEN
ABO/RH(D): O POS
Antibody Screen: NEGATIVE

## 2023-10-27 NOTE — ED Triage Notes (Signed)
Patient to ED by POV with c/o Abnormal Labs. Per patient she was sent from MD office for blood transfusion due to low hemoglobin. Patient voices weakness no N/V/fever.

## 2023-10-27 NOTE — ED Provider Notes (Signed)
[facility] Provider Note  CSN: [id number] Arrival date & time: 10/27/23 1418  Chief Complaint(s) Abnormal Labs  HPI Elaine Rivera is a 78 y.o. female with PMH iron deficiency anemia, peptic ulcer disease, brain aneurysm, previous CVA who presents emergency room for evaluation of abnormal labs and generalized fatigue.  She states that over the last 5 days she has been feeling more fatigued and was unable to get out of bed.  However today she feels significantly improved and is denying any chest pain, shortness of breath, abdominal pain, nausea, vomiting or any other systemic symptoms.  She states that her primary care provider told her to come to the emergency department for a blood transfusion.  Denies any black stools, hematochezia.   Past Medical History 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   noted on EKG   Scoliosis    Seizures (HCC)    Manifesting with aphasia   Severe protein-calorie malnutrition (HCC)    Stroke [facility])    Patient Active Problem List   Diagnosis Date Noted   Symptomatic anemia 03/16/2023   Palliative care encounter 12/30/2021   FTT (failure to thrive) in adult 11/13/2021   GI bleed 11/13/2021   Acute lower GI bleeding 11/11/2021   Acute blood loss anemia 11/11/2021   Melena 11/11/2021   Seizure disorder (HCC)  10/27/2021   Protein-calorie malnutrition, severe 08/26/2021   Failure to thrive in adult 08/23/2021   Right 10th rib fracture 08/23/2021   Physical deconditioning 08/23/2021   Bad headache 03/14/2021   Seizure (HCC) 03/06/2021   CVA (cerebral vascular accident) (HCC) 03/06/2021   Ischemic cerebrovascular accident (CVA) (HCC) 03/05/2021   Hypokalemia 03/05/2021   Anxiety 03/05/2021   Transient speech disturbance 03/05/2021   Lung nodule 03/05/2021   Aphasia    Fall    Anemia 02/28/2020   Shortness of breath 01/24/2020   Dizziness 01/24/2020   Herpes 10/04/2018   Cerebral aneurysm 09/05/2018   NSAID long-term use 04/20/2016   Scoliosis 04/20/2016   Depression 04/20/2016   HTN (hypertension) 04/20/2016   GERD (gastroesophageal reflux disease) 04/20/2016   Occult GI bleeding 04/20/2016   Home Medication(s) Prior to Admission medications   Medication Sig Start Date End Date Taking? Authorizing Provider  acetaminophen (TYLENOL) 500 MG tablet Take 500-1,000 mg by mouth every 6 (six) hours as needed for moderate pain or mild pain.    [provider]  atorvastatin (LIPITOR) 40 MG tablet Take 1 tablet (40 mg total) by mouth daily. 03/18/23   Amin, Ankit C, MD  Camphor-Menthol-Methyl Sal (SALONPAS) 3.10-03-08 % PTCH Apply 1 patch topically 2 (two) times daily as needed (For pain).  [provider]  docusate sodium (COLACE) 100 MG capsule Take 1 capsule (100 mg total) by mouth 2 (two) times daily. 03/18/23   Amin, Ankit C, MD  ferrous sulfate 325 (65 FE) MG EC tablet Take 1 tablet (325 mg total) by mouth daily with breakfast. 03/18/23 03/17/24  Miguel Rota, MD  folic acid (FOLVITE) 1 MG tablet Take 1 tablet (1 mg total) by mouth daily. 11/15/21   Albertine Grates, MD  gabapentin (NEURONTIN) 100 MG capsule Take 100 mg by mouth 2 (two) times daily.    [provider]  HYDROcodone-acetaminophen (NORCO/VICODIN) 5-325 MG tablet Take 1 tablet by mouth 2 (two) times daily.     [provider]  meloxicam (MOBIC) 15 MG tablet Take 1 tablet by mouth daily.    [provider]  mirtazapine (REMERON) 7.5 MG tablet Take 1 tablet (7.5 mg total) by mouth at bedtime. 08/26/21   Willeen Niece, MD  omeprazole (PRILOSEC) 40 MG capsule Take 40 mg by mouth daily. 03/23/23   [provider]  pantoprazole (PROTONIX) 40 MG tablet Take 1 tablet (40 mg total) by mouth daily before breakfast. 03/19/23   Amin, Ankit C, MD  potassium chloride SA (KLOR-CON M) 20 MEQ tablet Take 20 mEq by mouth daily.    [provider]  thiamine 100 MG tablet Take 1 tablet (100 mg total) by mouth daily. 11/14/21   Albertine Grates, MD  Vitamin D, Ergocalciferol, (DRISDOL) 1.25 MG (50000 UNIT) CAPS capsule Take 50,000 Units by mouth once a week. 04/23/23   [provider]                                                                                                                                    Past Surgical History Past Surgical History:  Procedure Laterality Date   BIOPSY  03/17/2023   Procedure: BIOPSY;  Surgeon: Jeani Hawking, MD;  Location: [facility];  Service: Gastroenterology;;   CEREBRAL ANEURYSM REPAIR     1998   COLONOSCOPY     ECTOPIC PREGNANCY SURGERY     x2    ENTEROSCOPY N/A 03/01/2020   Procedure: ENTEROSCOPY;  Surgeon: Jeani Hawking, MD;  Location: [facility];  Service: Endoscopy;  Laterality: N/A;   ENTEROSCOPY N/A 11/14/2021   Procedure: ENTEROSCOPY;  Surgeon: Jeani Hawking, MD;  Location: [facility];  Service: Endoscopy;  Laterality: N/A;   ENTEROSCOPY N/A 03/17/2023   Procedure: ENTEROSCOPY;  Surgeon: Jeani Hawking, MD;  Location: [facility];  Service: Gastroenterology;  Laterality: N/A;   ESOPHAGOGASTRODUODENOSCOPY N/A 04/22/2016   Procedure: ESOPHAGOGASTRODUODENOSCOPY (EGD);  Surgeon: Charna Elizabeth, MD;  Location: [facility];  Service: Endoscopy;  Laterality: N/A;   ESOPHAGOGASTRODUODENOSCOPY (EGD) WITH PROPOFOL N/A 11/12/2021    Procedure: ESOPHAGOGASTRODUODENOSCOPY (EGD) WITH PROPOFOL;  Surgeon: Jeani Hawking, MD;  Location: [facility];  Service: Endoscopy;  Laterality: N/A;   FEMORAL HERNIA REPAIR Right 07/02/2022   Procedure: Laparoscopic right femoral hernia with  mesh;  Surgeon: Axel Filler, MD;  Location: [facility];  Service: General;  Laterality: Right;   GIVENS CAPSULE STUDY N/A 11/12/2021   Procedure: GIVENS CAPSULE STUDY;  Surgeon: Jeani Hawking, MD;  Location: [facility];  Service: Endoscopy;  Laterality: N/A;   IR 3D INDEPENDENT WKST  09/05/2018   IR ANGIO INTRA EXTRACRAN SEL COM CAROTID INNOMINATE BILAT MOD SED  09/05/2018   IR ANGIO VERTEBRAL SEL VERTEBRAL UNI R MOD SED  09/05/2018   IR ANGIOGRAM FOLLOW UP STUDY  09/05/2018   IR RADIOLOGIST EVAL & MGMT  03/28/2021   IR TRANSCATH/EMBOLIZ  09/05/2018   PARATHYROIDECTOMY     2002   RADIOLOGY WITH ANESTHESIA N/A 09/05/2018   Procedure: EMBOLIZATION;  Surgeon: Radiologist, Medication, MD;  Location: [facility] OR;  Service: Radiology;  Laterality: N/A;   TONSILLECTOMY     1953   VAGINAL HYSTERECTOMY     2001   VOCAL CORD LATERALIZATION, ENDOSCOPIC APPROACH W/ MLB     2007 (@[facility] )   WRIST SURGERY     left side/2008   WRIST SURGERY     right side/ 2002   Family History Family History  Problem Relation Age of Onset   Cancer Mother    Suicidality Father     Social History Social History   Tobacco Use   Smoking status: Former    Types: Cigarettes   Smokeless tobacco: Never  Vaping Use   Vaping status: Never Used  Substance Use Topics   Alcohol use: No   Drug use: Never   Allergies Sulfa antibiotics and Tramadol  Review of Systems Review of Systems  Constitutional:  Positive for fatigue.    Physical Exam Vital Signs  I have reviewed the triage vital signs BP (!) 142/91   Pulse 82   Temp (!) 97.5 F (36.4 C) (Oral)   Resp 20   Ht 5\' 4"  (1.626 m)   Wt 43.5 kg   SpO2 97%   BMI 16.48 kg/m   Physical Exam Vitals and nursing note  reviewed.  Constitutional:      General: She is not in acute distress.    Appearance: She is well-developed.  HENT:     Head: Normocephalic and atraumatic.  Eyes:     Conjunctiva/sclera: Conjunctivae normal.  Cardiovascular:     Rate and Rhythm: Normal rate and regular rhythm.     Heart sounds: No murmur heard. Pulmonary:     Effort: Pulmonary effort is normal. No respiratory distress.     Breath sounds: Normal breath sounds.  Abdominal:     Palpations: Abdomen is soft.     Tenderness: There is no abdominal tenderness.  Musculoskeletal:        General: No swelling.     Cervical back: Neck supple.  Skin:    General: Skin is warm and dry.     Capillary Refill: Capillary refill takes less than 2 seconds.  Neurological:     Mental Status: She is alert.  Psychiatric:        Mood and Affect: Mood normal.     ED Results and Treatments Labs (all labs ordered are listed, but only abnormal results are displayed) Labs Reviewed  CBC WITH DIFFERENTIAL/PLATELET - Abnormal; Notable for the following components:      Result Value   RBC 5.14 (*)    Hemoglobin 15.1 (*)    HCT 47.0 (*)    All other components within normal limits  COMPREHENSIVE METABOLIC PANEL - Abnormal; Notable for the following components:  Potassium 3.2 (*)    CO2 21 (*)    Glucose, Bld 168 (*)    All other components within normal limits  TYPE AND SCREEN                                                                                                                          Radiology No results found.  Pertinent labs & imaging results that were available during my care of the patient were reviewed by me and considered in my medical decision making (see MDM for details).  Medications Ordered in ED Medications - No data to display                                                                                                                                   Procedures Procedures  (including critical care  time)  Medical Decision Making / ED Course   This patient presents to the ED for concern of fatigue, this involves an extensive number of treatment options, and is a complaint that carries with it a high risk of complications and morbidity.  The differential diagnosis includes anemia, dehydration, electrolyte abnormality  MDM: Patient seen emergency room for evaluation of fatigue and abnormal labs.  Physical is unremarkable.  Laboratory evaluation is overall reassuring with a hemoglobin of 15.1, mild hypokalemia to 3.2.   Additional history obtained: -Additional history obtained from *** -External records from outside source obtained and reviewed including: Chart review including previous notes, labs, imaging, consultation notes   Lab Tests: -I ordered, reviewed, and interpreted labs.   The pertinent results include:   Labs Reviewed  CBC WITH DIFFERENTIAL/PLATELET - Abnormal; Notable for the following components:      Result Value   RBC 5.14 (*)    Hemoglobin 15.1 (*)    HCT 47.0 (*)    All other components within normal limits  COMPREHENSIVE METABOLIC PANEL - Abnormal; Notable for the following components:   Potassium 3.2 (*)    CO2 21 (*)    Glucose, Bld 168 (*)    All other components within normal limits  TYPE AND SCREEN      EKG ***  EKG Interpretation Date/Time:    Ventricular Rate:    PR Interval:    QRS Duration:    QT Interval:    QTC Calculation:   R Axis:      Text Interpretation:  Imaging Studies ordered: I ordered imaging studies including *** I independently visualized and interpreted imaging. I agree with the radiologist interpretation   Medicines ordered and prescription drug management: No orders of the defined types were placed in this encounter.   -I have reviewed the patients home medicines and have made adjustments as needed  Critical interventions ***  Consultations Obtained: I requested consultation with the ***,  and  discussed lab and imaging findings as well as pertinent plan - they recommend: ***   Cardiac Monitoring: The patient was maintained on a cardiac monitor.  I personally viewed and interpreted the cardiac monitored which showed an underlying rhythm of: ***  Social Determinants of Health:  Factors impacting patients care include: ***   Reevaluation: After the interventions noted above, I reevaluated the patient and found that they have :{resolved/improved/worsened:23923::"improved"}  Co morbidities that complicate the patient evaluation  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   noted on EKG   Scoliosis    Seizures (HCC)    Manifesting with aphasia   Severe protein-calorie malnutrition (HCC)    Stroke (HCC)       Dispostion: I considered admission for this patient, ***     Final Clinical Impression(s) / ED Diagnoses Final diagnoses:  Well adult exam     @PCDICTATION @

## 2023-10-29 ENCOUNTER — Other Ambulatory Visit: Payer: Self-pay | Admitting: *Deleted

## 2023-10-29 DIAGNOSIS — D500 Iron deficiency anemia secondary to blood loss (chronic): Secondary | ICD-10-CM

## 2023-10-29 DIAGNOSIS — D649 Anemia, unspecified: Secondary | ICD-10-CM

## 2023-11-01 ENCOUNTER — Inpatient Hospital Stay: Payer: HMO | Attending: Hematology and Oncology

## 2023-11-01 ENCOUNTER — Inpatient Hospital Stay: Payer: HMO | Admitting: Hematology and Oncology

## 2023-12-24 ENCOUNTER — Other Ambulatory Visit: Payer: Self-pay

## 2023-12-24 ENCOUNTER — Emergency Department (HOSPITAL_COMMUNITY)

## 2023-12-24 ENCOUNTER — Observation Stay (HOSPITAL_COMMUNITY)
Admission: EM | Admit: 2023-12-24 | Discharge: 2023-12-28 | Disposition: A | Discharge status: Home / Self Care | Attending: Family Medicine | Admitting: Family Medicine

## 2023-12-24 DIAGNOSIS — Z79899 Other long term (current) drug therapy: Secondary | ICD-10-CM | POA: Diagnosis not present

## 2023-12-24 DIAGNOSIS — D500 Iron deficiency anemia secondary to blood loss (chronic): Secondary | ICD-10-CM | POA: Diagnosis not present

## 2023-12-24 DIAGNOSIS — G609 Hereditary and idiopathic neuropathy, unspecified: Secondary | ICD-10-CM | POA: Diagnosis not present

## 2023-12-24 DIAGNOSIS — R531 Weakness: Secondary | ICD-10-CM | POA: Diagnosis present

## 2023-12-24 DIAGNOSIS — E876 Hypokalemia: Secondary | ICD-10-CM | POA: Insufficient documentation

## 2023-12-24 DIAGNOSIS — D509 Iron deficiency anemia, unspecified: Secondary | ICD-10-CM | POA: Insufficient documentation

## 2023-12-24 DIAGNOSIS — F0390 Unspecified dementia without behavioral disturbance: Secondary | ICD-10-CM | POA: Diagnosis not present

## 2023-12-24 DIAGNOSIS — Z8673 Personal history of transient ischemic attack (TIA), and cerebral infarction without residual deficits: Secondary | ICD-10-CM | POA: Insufficient documentation

## 2023-12-24 DIAGNOSIS — Z87891 Personal history of nicotine dependence: Secondary | ICD-10-CM | POA: Insufficient documentation

## 2023-12-24 DIAGNOSIS — W19XXXA Unspecified fall, initial encounter: Secondary | ICD-10-CM | POA: Diagnosis not present

## 2023-12-24 DIAGNOSIS — Z955 Presence of coronary angioplasty implant and graft: Secondary | ICD-10-CM | POA: Diagnosis not present

## 2023-12-24 DIAGNOSIS — N390 Urinary tract infection, site not specified: Secondary | ICD-10-CM | POA: Insufficient documentation

## 2023-12-24 DIAGNOSIS — E785 Hyperlipidemia, unspecified: Secondary | ICD-10-CM | POA: Insufficient documentation

## 2023-12-24 LAB — CBC WITH DIFFERENTIAL/PLATELET
Abs Immature Granulocytes: 0.02 10*3/uL (ref 0.00–0.07)
Basophils Absolute: 0.1 10*3/uL (ref 0.0–0.1)
Basophils Relative: 1 %
Eosinophils Absolute: 0 10*3/uL (ref 0.0–0.5)
Eosinophils Relative: 0 %
HCT: 42.3 % (ref 36.0–46.0)
Hemoglobin: 13.9 g/dL (ref 12.0–15.0)
Immature Granulocytes: 0 %
Lymphocytes Relative: 11 %
Lymphs Abs: 0.8 10*3/uL (ref 0.7–4.0)
MCH: 28.6 pg (ref 26.0–34.0)
MCHC: 32.9 g/dL (ref 30.0–36.0)
MCV: 87 fL (ref 80.0–100.0)
Monocytes Absolute: 0.4 10*3/uL (ref 0.1–1.0)
Monocytes Relative: 6 %
Neutro Abs: 5.7 10*3/uL (ref 1.7–7.7)
Neutrophils Relative %: 82 %
Platelets: 272 10*3/uL (ref 150–400)
RBC: 4.86 MIL/uL (ref 3.87–5.11)
RDW: 12.6 % (ref 11.5–15.5)
WBC: 7 10*3/uL (ref 4.0–10.5)
nRBC: 0 % (ref 0.0–0.2)

## 2023-12-24 LAB — COMPREHENSIVE METABOLIC PANEL WITH GFR
ALT: 14 U/L (ref 0–44)
AST: 19 U/L (ref 15–41)
Albumin: 3.7 g/dL (ref 3.5–5.0)
Alkaline Phosphatase: 88 U/L (ref 38–126)
Anion gap: 12 (ref 5–15)
BUN: 18 mg/dL (ref 8–23)
CO2: 28 mmol/L (ref 22–32)
Calcium: 9.4 mg/dL (ref 8.9–10.3)
Chloride: 100 mmol/L (ref 98–111)
Creatinine, Ser: 0.58 mg/dL (ref 0.44–1.00)
GFR, Estimated: >60 mL/min (ref >60)
Glucose, Bld: 111 mg/dL — ABNORMAL HIGH (ref 70–99)
Potassium: 2.5 mmol/L — CL (ref 3.5–5.1)
Sodium: 140 mmol/L (ref 135–145)
Total Bilirubin: 1 mg/dL (ref 0.0–1.2)
Total Protein: 7.2 g/dL (ref 6.5–8.1)

## 2023-12-24 LAB — URINALYSIS, ROUTINE W REFLEX MICROSCOPIC
Bilirubin Urine: NEGATIVE
Glucose, UA: NEGATIVE mg/dL
Ketones, ur: NEGATIVE mg/dL
Nitrite: POSITIVE — AB
Protein, ur: 30 mg/dL — AB
Specific Gravity, Urine: 1.013 (ref 1.005–1.030)
WBC, UA: >50 WBC/hpf (ref 0–5)
pH: 7 (ref 5.0–8.0)

## 2023-12-24 LAB — MAGNESIUM: Magnesium: 2.1 mg/dL (ref 1.7–2.4)

## 2023-12-24 MED ORDER — TRAZODONE HCL 50 MG PO TABS: 25.0000 mg | ORAL_TABLET | Freq: Every evening | ORAL | Status: DC | PRN

## 2023-12-24 MED ORDER — POTASSIUM CHLORIDE CRYS ER 20 MEQ PO TBCR
20.0000 meq | EXTENDED_RELEASE_TABLET | Freq: Every day | ORAL | Status: DC
Start: 2023-12-25 — End: 2023-12-28
  Administered 2023-12-25 – 2023-12-28 (×4): 20 meq via ORAL
  Filled 2023-12-24 (×4): qty 1

## 2023-12-24 MED ORDER — GABAPENTIN 100 MG PO CAPS
100.0000 mg | ORAL_CAPSULE | Freq: Two times a day (BID) | ORAL | Status: DC
Start: 2023-12-24 — End: 2023-12-28
  Administered 2023-12-24 – 2023-12-28 (×8): 100 mg via ORAL
  Filled 2023-12-24 (×8): qty 1

## 2023-12-24 MED ORDER — POTASSIUM CHLORIDE 20 MEQ PO PACK
40.0000 meq | PACK | Freq: Once | ORAL | Status: AC
  Administered 2023-12-24: 40 meq via ORAL
  Filled 2023-12-24: qty 2

## 2023-12-24 MED ORDER — POTASSIUM CHLORIDE 20 MEQ/100ML IV SOLN: 20.0000 meq | Freq: Once | INTRAVENOUS | Status: DC

## 2023-12-24 MED ORDER — ONDANSETRON HCL 4 MG PO TABS: 4.0000 mg | ORAL_TABLET | Freq: Four times a day (QID) | ORAL | Status: DC | PRN

## 2023-12-24 MED ORDER — POTASSIUM CHLORIDE 10 MEQ/100ML IV SOLN
10.0000 meq | INTRAVENOUS | Status: AC
  Administered 2023-12-24 (×2): 10 meq via INTRAVENOUS
  Filled 2023-12-24 (×2): qty 100

## 2023-12-24 MED ORDER — MAGNESIUM SULFATE 2 GM/50ML IV SOLN
2.0000 g | Freq: Once | INTRAVENOUS | Status: AC
  Administered 2023-12-24: 2 g via INTRAVENOUS
  Filled 2023-12-24: qty 50

## 2023-12-24 MED ORDER — ENOXAPARIN SODIUM 30 MG/0.3ML IJ SOSY
30.0000 mg | PREFILLED_SYRINGE | INTRAMUSCULAR | Status: DC
  Administered 2023-12-24 – 2023-12-27 (×4): 30 mg via SUBCUTANEOUS
  Filled 2023-12-24 (×4): qty 0.3

## 2023-12-24 MED ORDER — PANTOPRAZOLE SODIUM 40 MG PO TBEC
40.0000 mg | DELAYED_RELEASE_TABLET | Freq: Every day | ORAL | Status: DC
  Administered 2023-12-25 – 2023-12-28 (×4): 40 mg via ORAL
  Filled 2023-12-24 (×4): qty 1

## 2023-12-24 MED ORDER — LOPERAMIDE HCL 2 MG PO CAPS: 2.0000 mg | ORAL_CAPSULE | ORAL | Status: DC | PRN

## 2023-12-24 MED ORDER — ONDANSETRON HCL 4 MG/2ML IJ SOLN: 4.0000 mg | Freq: Four times a day (QID) | INTRAMUSCULAR | Status: DC | PRN

## 2023-12-24 MED ORDER — FERROUS SULFATE 325 (65 FE) MG PO TABS
325.0000 mg | ORAL_TABLET | Freq: Every day | ORAL | Status: DC
  Administered 2023-12-25 – 2023-12-28 (×4): 325 mg via ORAL
  Filled 2023-12-24 (×5): qty 1

## 2023-12-24 MED ORDER — ACETAMINOPHEN 650 MG RE SUPP: 650.0000 mg | Freq: Four times a day (QID) | RECTAL | Status: DC | PRN

## 2023-12-24 MED ORDER — MIRTAZAPINE 7.5 MG PO TABS
7.5000 mg | ORAL_TABLET | Freq: Every day | ORAL | Status: DC
  Administered 2023-12-24 – 2023-12-27 (×4): 7.5 mg via ORAL
  Filled 2023-12-24 (×5): qty 1

## 2023-12-24 MED ORDER — ACETAMINOPHEN 325 MG PO TABS: 650.0000 mg | ORAL_TABLET | Freq: Four times a day (QID) | ORAL | Status: DC | PRN

## 2023-12-24 MED ORDER — ATORVASTATIN CALCIUM 40 MG PO TABS
40.0000 mg | ORAL_TABLET | Freq: Every day | ORAL | Status: DC
  Administered 2023-12-25 – 2023-12-28 (×4): 40 mg via ORAL
  Filled 2023-12-24 (×4): qty 1

## 2023-12-24 MED ORDER — ALBUTEROL SULFATE (2.5 MG/3ML) 0.083% IN NEBU: 2.5000 mg | INHALATION_SOLUTION | RESPIRATORY_TRACT | Status: DC | PRN

## 2023-12-24 NOTE — ED Provider Notes (Signed)
 [place] EMERGENCY DEPARTMENT AT [facility] Provider Note   CSN: [id number] Arrival date & time: 12/24/23  1104     History  Chief Complaint  Patient presents with   Elaine Rivera is a 78 y.o. female.  With a history of CVA, GI bleeding, iron deficiency anemia and seizure who presents to the ED after a fall.  Patient was standing in her bathroom when her legs became weak and cause her to fall.  She fell mostly on her backside.  No head trauma or loss consciousness.  Now with some mid back discomfort.  Denies headaches neck pain chest pain shortness of breath abdominal pain and pain in other extremities.  No anticoagulation.  Denies focal weakness but feels weak all over.   Fall       Home Medications Prior to Admission medications   Medication Sig Start Date End Date Taking? Authorizing Provider  diphenoxylate-atropine (LOMOTIL) 2.5-0.025 MG tablet SMARTSIG:2 Capsule(s) By Mouth 4 Times Daily PRN 10/20/23  Yes [provider]  escitalopram (LEXAPRO) 10 MG tablet Take 10 mg by mouth daily. 12/16/23  Yes [provider]  megestrol (MEGACE) 20 MG tablet Take 20 mg by mouth 2 (two) times daily. 12/16/23  Yes [provider]  acetaminophen (TYLENOL) 500 MG tablet Take 500-1,000 mg by mouth every 6 (six) hours as needed for moderate pain or mild pain.    [provider]  atorvastatin (LIPITOR) 40 MG tablet Take 1 tablet (40 mg total) by mouth daily. 03/18/23   Amin, Ankit C, MD  Camphor-Menthol-Methyl Sal (SALONPAS) 3.10-03-08 % PTCH Apply 1 patch topically 2 (two) times daily as needed (For pain).    [provider]  docusate sodium (COLACE) 100 MG capsule Take 1 capsule (100 mg total) by mouth 2 (two) times daily. 03/18/23   Amin, Ankit C, MD  ferrous sulfate 325 (65 FE) MG EC tablet Take 1 tablet (325 mg total) by mouth daily with breakfast. 03/18/23 03/17/24  Miguel Rota, MD  folic acid (FOLVITE) 1 MG tablet Take 1  tablet (1 mg total) by mouth daily. 11/15/21   Albertine Grates, MD  gabapentin (NEURONTIN) 100 MG capsule Take 100 mg by mouth 2 (two) times daily.    [provider]  HYDROcodone-acetaminophen (NORCO/VICODIN) 5-325 MG tablet Take 1 tablet by mouth 2 (two) times daily.    [provider]  meloxicam (MOBIC) 15 MG tablet Take 1 tablet by mouth daily.    [provider]  mirtazapine (REMERON) 7.5 MG tablet Take 1 tablet (7.5 mg total) by mouth at bedtime. 08/26/21   Willeen Niece, MD  omeprazole (PRILOSEC) 40 MG capsule Take 40 mg by mouth daily. 03/23/23   [provider]  pantoprazole (PROTONIX) 40 MG tablet Take 1 tablet (40 mg total) by mouth daily before breakfast. 03/19/23   Amin, Ankit C, MD  potassium chloride SA (KLOR-CON M) 20 MEQ tablet Take 20 mEq by mouth daily.    [provider]  thiamine 100 MG tablet Take 1 tablet (100 mg total) by mouth daily. 11/14/21   Albertine Grates, MD  Vitamin D, Ergocalciferol, (DRISDOL) 1.25 MG (50000 UNIT) CAPS capsule Take 50,000 Units by mouth once a week. 04/23/23   [provider]      Allergies    Sulfa antibiotics and Tramadol    Review of Systems   Review of Systems  Physical Exam Updated Vital Signs BP (!) 142/107 (BP Location: Left Arm)  Pulse 70   Temp 98.7 F (37.1 C) (Oral)   Resp (!) 24   Ht 5\' 4"  (1.626 m)   Wt 43 kg   SpO2 99%   BMI 16.27 kg/m  Physical Exam Vitals and nursing note reviewed.  Constitutional:      Comments: Disheveled  HENT:     Head: Normocephalic and atraumatic.  Eyes:     Pupils: Pupils are equal, round, and reactive to light.  Cardiovascular:     Rate and Rhythm: Normal rate and regular rhythm.  Pulmonary:     Effort: Pulmonary effort is normal.     Breath sounds: Normal breath sounds.  Abdominal:     Palpations: Abdomen is soft.     Tenderness: There is no abdominal tenderness.  Musculoskeletal:     Cervical back: Neck supple. No tenderness.     Comments:  Abrasion over right paraspinal thoracic region No midline tenderness or step-off With normal kyphosis and spinal curvature secondary to scoliosis  Skin:    General: Skin is warm and dry.  Neurological:     General: No focal deficit present.     Mental Status: She is alert and oriented to person, place, and time.     Sensory: No sensory deficit.     Motor: No weakness.  Psychiatric:        Mood and Affect: Mood normal.     ED Results / Procedures / Treatments   Labs (all labs ordered are listed, but only abnormal results are displayed) Labs Reviewed  COMPREHENSIVE METABOLIC PANEL WITH GFR - Abnormal; Notable for the following components:      Result Value   Potassium 2.5 (*)    Glucose, Bld 111 (*)    All other components within normal limits  CBC WITH DIFFERENTIAL/PLATELET  URINALYSIS, ROUTINE W REFLEX MICROSCOPIC  MAGNESIUM    EKG EKG Interpretation Date/Time:  Friday December 24 2023 11:41:23 EDT Ventricular Rate:  57 PR Interval:  166 QRS Duration:  73 QT Interval:  529 QTC Calculation: 516 R Axis:   -58  Text Interpretation: Sinus rhythm Probable left atrial enlargement Left anterior fascicular block Abnormal R-wave progression, late transition Nonspecific T abnrm, anterolateral leads Prolonged QT interval Confirmed by Estelle June [phone]) on 12/24/2023 2:18:28 PM  Radiology DG Pelvis Portable Result Date: 12/24/2023 CLINICAL DATA:  Fall. EXAM: PORTABLE PELVIS 1-2 VIEWS COMPARISON:  August 22, 2021. FINDINGS: There is no evidence of pelvic fracture or diastasis. No pelvic bone lesions are seen. IMPRESSION: Negative. Electronically Signed   By: Lupita Raider M.D.   On: 12/24/2023 12:55   DG Chest Portable 1 View Result Date: 12/24/2023 CLINICAL DATA:  Fall. EXAM: PORTABLE CHEST 1 VIEW COMPARISON:  November 11, 2021. FINDINGS: Severe dextroscoliosis of thoracic spine is again noted. Old right rib fracture is noted. Stable cardiomediastinal silhouette. No acute  pulmonary disease is noted. IMPRESSION: No active disease. Electronically Signed   By: Lupita Raider M.D.   On: 12/24/2023 12:54   CT THORACIC SPINE WO CONTRAST Result Date: 12/24/2023 CLINICAL DATA:  Back trauma, no prior imaging (Age >= 16y); Back trauma, no prior imaging (Age >= 16y) pain s/p fall, h/o scoliosis. EXAM: CT THORACIC AND LUMBAR SPINE WITHOUT CONTRAST TECHNIQUE: Multidetector CT imaging of the thoracic and lumbar spine was performed without contrast. Multiplanar CT image reconstructions were also generated. RADIATION DOSE REDUCTION: This exam was performed according to the departmental dose-optimization program which includes automated exposure control, adjustment of the mA and/or kV according  to patient size and/or use of iterative reconstruction technique. COMPARISON:  Thoracic and lumbar spine radiographs 08/22/2021. Chest CT 03/06/2021. FINDINGS: CT THORACIC SPINE FINDINGS Alignment: Unchanged severe dextroscoliotic curvature of the thoracic spine. Vertebrae: Normal vertebral body heights. No acute vertebral fracture or suspicious bone lesion. Subacute fracture of the right sixth rib. Paraspinal and other soft tissues: Unchanged 7 mm solid nodule in lateral aspect of the right lower lobe (axial image 76 series 9). Moderate hiatal hernia. Disc levels: Within limitations of severe dextroscoliotic curvature, no high-grade spinal canal stenosis or neural foraminal narrowing. CT LUMBAR SPINE FINDINGS Segmentation: Conventional numbering is assumed with 5 non-rib-bearing, lumbar type vertebral bodies. Diminutive ribs at T12. Alignment: Unchanged severe levoscoliotic curvature of the lumbar spine with left lateral listhesis L4 on L5 and right lateral listhesis of L1 on L2 and L3 on L4. Vertebrae: Normal vertebral body heights. No acute fracture. Multilevel degenerative endplate changes. Paraspinal and other soft tissues: Bilateral renal calculi, measuring up to 5 mm on the left. Cholelithiasis.  Atherosclerotic calcifications of the abdominal aorta and its branches. Disc levels: Multilevel lumbar spondylosis, worst at L1-2, where there is mild spinal canal stenosis and moderate bilateral neural foraminal narrowing. IMPRESSION: 1. No acute fracture or traumatic listhesis of the thoracic or lumbar spine. 2. Subacute fracture of the right sixth rib. 3. Unchanged 7 mm solid nodule in the right lower lobe. 4. Bilateral renal calculi, measuring up to 5 mm on the left. 5. Cholelithiasis. Electronically Signed   By: Orvan Falconer M.D.   On: 12/24/2023 12:43   CT Lumbar Spine Wo Contrast Result Date: 12/24/2023 CLINICAL DATA:  Back trauma, no prior imaging (Age >= 16y); Back trauma, no prior imaging (Age >= 16y) pain s/p fall, h/o scoliosis. EXAM: CT THORACIC AND LUMBAR SPINE WITHOUT CONTRAST TECHNIQUE: Multidetector CT imaging of the thoracic and lumbar spine was performed without contrast. Multiplanar CT image reconstructions were also generated. RADIATION DOSE REDUCTION: This exam was performed according to the departmental dose-optimization program which includes automated exposure control, adjustment of the mA and/or kV according to patient size and/or use of iterative reconstruction technique. COMPARISON:  Thoracic and lumbar spine radiographs 08/22/2021. Chest CT 03/06/2021. FINDINGS: CT THORACIC SPINE FINDINGS Alignment: Unchanged severe dextroscoliotic curvature of the thoracic spine. Vertebrae: Normal vertebral body heights. No acute vertebral fracture or suspicious bone lesion. Subacute fracture of the right sixth rib. Paraspinal and other soft tissues: Unchanged 7 mm solid nodule in lateral aspect of the right lower lobe (axial image 76 series 9). Moderate hiatal hernia. Disc levels: Within limitations of severe dextroscoliotic curvature, no high-grade spinal canal stenosis or neural foraminal narrowing. CT LUMBAR SPINE FINDINGS Segmentation: Conventional numbering is assumed with 5 non-rib-bearing,  lumbar type vertebral bodies. Diminutive ribs at T12. Alignment: Unchanged severe levoscoliotic curvature of the lumbar spine with left lateral listhesis L4 on L5 and right lateral listhesis of L1 on L2 and L3 on L4. Vertebrae: Normal vertebral body heights. No acute fracture. Multilevel degenerative endplate changes. Paraspinal and other soft tissues: Bilateral renal calculi, measuring up to 5 mm on the left. Cholelithiasis. Atherosclerotic calcifications of the abdominal aorta and its branches. Disc levels: Multilevel lumbar spondylosis, worst at L1-2, where there is mild spinal canal stenosis and moderate bilateral neural foraminal narrowing. IMPRESSION: 1. No acute fracture or traumatic listhesis of the thoracic or lumbar spine. 2. Subacute fracture of the right sixth rib. 3. Unchanged 7 mm solid nodule in the right lower lobe. 4. Bilateral renal calculi, measuring up to 5  mm on the left. 5. Cholelithiasis. Electronically Signed   By: Orvan Falconer M.D.   On: 12/24/2023 12:43   CT Head Wo Contrast Result Date: 12/24/2023 CLINICAL DATA:  Neck trauma.  Weakness with fall. EXAM: CT HEAD WITHOUT CONTRAST CT CERVICAL SPINE WITHOUT CONTRAST TECHNIQUE: Multidetector CT imaging of the head and cervical spine was performed following the standard protocol without intravenous contrast. Multiplanar CT image reconstructions of the cervical spine were also generated. RADIATION DOSE REDUCTION: This exam was performed according to the departmental dose-optimization program which includes automated exposure control, adjustment of the mA and/or kV according to patient size and/or use of iterative reconstruction technique. COMPARISON:  11/11/2021 FINDINGS: CT HEAD FINDINGS Brain: No evidence of acute infarction, hemorrhage, hydrocephalus, extra-axial collection or mass lesion/mass effect. 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 Chronic  ventriculomegaly and confluent periventricular chronic small vessel ischemia. Vascular: Right vertebral stent Skull: Right suboccipital craniectomy.  No acute finding Sinuses/Orbits: Negative CT CERVICAL SPINE FINDINGS Alignment: Scoliosis.  No traumatic malalignment Skull base and vertebrae: No acute fracture. No primary bone lesion or focal pathologic process. Soft tissues and spinal canal: No prevertebral fluid or swelling. No visible canal hematoma. Indication of prior vocal cord augmentation on the right. Disc levels:  Generalized degenerative endplate and facet spurring. Upper chest: Band of scar-like density at the right apex. IMPRESSION: No evidence of acute intracranial or cervical spine injury. Chronic and postoperative changes as described. Electronically Signed   By: Tiburcio Pea M.D.   On: 12/24/2023 12:33   CT Cervical Spine Wo Contrast Result Date: 12/24/2023 CLINICAL DATA:  Neck trauma.  Weakness with fall. EXAM: CT HEAD WITHOUT CONTRAST CT CERVICAL SPINE WITHOUT CONTRAST TECHNIQUE: Multidetector CT imaging of the head and cervical spine was performed following the standard protocol without intravenous contrast. Multiplanar CT image reconstructions of the cervical spine were also generated. RADIATION DOSE REDUCTION: This exam was performed according to the departmental dose-optimization program which includes automated exposure control, adjustment of the mA and/or kV according to patient size and/or use of iterative reconstruction technique. COMPARISON:  11/11/2021 FINDINGS: CT HEAD FINDINGS Brain: No evidence of acute infarction, hemorrhage, hydrocephalus, extra-axial collection or mass lesion/mass effect. 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 Chronic ventriculomegaly and confluent periventricular chronic small vessel ischemia. Vascular: Right vertebral stent Skull: Right suboccipital craniectomy.  No  acute finding Sinuses/Orbits: Negative CT CERVICAL SPINE FINDINGS Alignment: Scoliosis.  No traumatic malalignment Skull base and vertebrae: No acute fracture. No primary bone lesion or focal pathologic process. Soft tissues and spinal canal: No prevertebral fluid or swelling. No visible canal hematoma. Indication of prior vocal cord augmentation on the right. Disc levels:  Generalized degenerative endplate and facet spurring. Upper chest: Band of scar-like density at the right apex. IMPRESSION: No evidence of acute intracranial or cervical spine injury. Chronic and postoperative changes as described. Electronically Signed   By: Tiburcio Pea M.D.   On: 12/24/2023 12:33    Procedures Procedures    Medications Ordered in ED Medications  magnesium sulfate IVPB 2 g 50 mL (2 g Intravenous New Bag/Given 12/24/23 1358)  potassium chloride 10 mEq in 100 mL IVPB (has no administration in time range)  atorvastatin (LIPITOR) tablet 40 mg (has no administration in time range)  mirtazapine (REMERON) tablet 7.5 mg (has no administration in time range)  pantoprazole (PROTONIX) EC  tablet 40 mg (has no administration in time range)  ferrous sulfate EC tablet 325 mg (has no administration in time range)  gabapentin (NEURONTIN) capsule 100 mg (has no administration in time range)  potassium chloride SA (KLOR-CON M) CR tablet 20 mEq (has no administration in time range)  enoxaparin (LOVENOX) injection 30 mg (has no administration in time range)  acetaminophen (TYLENOL) tablet 650 mg (has no administration in time range)    Or  acetaminophen (TYLENOL) suppository 650 mg (has no administration in time range)  traZODone (DESYREL) tablet 25 mg (has no administration in time range)  ondansetron (ZOFRAN) tablet 4 mg (has no administration in time range)    Or  ondansetron (ZOFRAN) injection 4 mg (has no administration in time range)  albuterol (PROVENTIL) (2.5 MG/3ML) 0.083% nebulizer solution 2.5 mg (has no  administration in time range)  potassium chloride (KLOR-CON) packet 40 mEq (40 mEq Oral Given 12/24/23 1352)    ED Course/ Medical Decision Making/ A&P Clinical Course as of 12/24/23 1455  Fri Dec 24, 2023  1307 Critical hypokalemia of 2.5.  Will provide oral and IV potassium repletion along with IV magnesium repletion [MP]  1454 No traumatic findings on imaging.  Awaiting urinalysis.  Will place Foley's catheter.  Discussed with admitting hospitalist accept patient for admission [MP]    Clinical Course User Index [MP] Royanne Foots, DO                                 Medical Decision Making 78 year old female with history as above presenting after fall.  Her legs felt weak and she felt them gave out while she was in the bathroom.  Fell on her backside.  No head trauma or loss of consciousness.  Afebrile slightly hypertensive here.  Well-appearing on my exam although she does appear disheveled.  Exam notable for small abrasion on her right back.  Given that she is unable to provide full history of the fall will obtain CT imaging including head C-spine thoracic spine and lumbar spine along with chest x-ray and pelvis x-ray to evaluate for traumatic injury.  Regarding her weakness differential diagnosis include iron deficiency anemia which she has a history of, electrolyte imbalance and acute infection.  Will obtain laboratory workup and urinalysis along with an EKG to evaluate for these pathologies  Amount and/or Complexity of Data Reviewed Labs: ordered. Radiology: ordered.  Risk Prescription drug management. Decision regarding hospitalization.           Final Clinical Impression(s) / ED Diagnoses Final diagnoses:  Fall, initial encounter  Generalized weakness  Hypokalemia    Rx / DC Orders ED Discharge Orders     None         Royanne Foots, DO 12/24/23 1455

## 2023-12-24 NOTE — Plan of Care (Signed)
  Problem: Education: Goal: Knowledge of General Education information will improve Description: Including pain rating scale, medication(s)/side effects and non-pharmacologic comfort measures Outcome: Progressing   Problem: Clinical Measurements: Goal: Ability to maintain clinical measurements within normal limits will improve Outcome: Progressing Goal: Respiratory complications will improve Outcome: Progressing Goal: Cardiovascular complication will be avoided Outcome: Progressing   Problem: Activity: Goal: Risk for activity intolerance will decrease Outcome: Not Progressing   Problem: Nutrition: Goal: Adequate nutrition will be maintained Outcome: Not Progressing   Problem: Safety: Goal: Ability to remain free from injury will improve Outcome: Progressing   Problem: Pain Managment: Goal: General experience of comfort will improve and/or be controlled Outcome: Progressing   Problem: Nutrition: Goal: Adequate nutrition will be maintained Outcome: Not Progressing

## 2023-12-24 NOTE — H&P (Signed)
 History and Physical  Elaine Rivera:[id number] DOB: 05-Dec-1945 DOA: 12/24/2023  PCP: Saundra Shelling, NP   Chief Complaint: Weakness, fall at home  HPI: Elaine Rivera is a 78 y.o. female with medical history significant for depression, dementia, prior CVA, GI bleeding, iron deficiency anemia being admitted to the hospital for observation after fall at home.  History is provided mainly by the patient's daughter, who states that she has had gradually advancing dementia over the last decade.  The patient lives on her own, has refused prior attempts to move her to a higher level of care.  Lately, she has been having some persistent diarrhea, apparently fell at home today.  She states that she was standing in her bathroom and her legs just gave out, she fell onto her back, denies hitting her head, or losing consciousness.  Otherwise, the patient and her daughter deny any recent illness or other acute concerns.  Workup in the ER including imaging as noted below and lab work is relatively benign, though she does have some significant hypokalemia.  Given her advanced age and multiple falls at home, hospitalist was contacted for observation admission.  Review of Systems: Please see HPI for pertinent positives and negatives. A complete 10 system review of systems are otherwise negative.  Past Medical History:  Diagnosis Date   Anemia    years ago   Aneurysm (HCC)    right PICA aneurysm and stent   Aphasia    Arthritis    Basal cell carcinoma of eye    biopsy of left eye/ non cancerous   Chronic back pain    Complication of anesthesia    nausea/vomiting   Depression    Failure to thrive in adult    GERD (gastroesophageal reflux disease)    GI bleed 10/2021   acute GI bleed with gastric erosion   Headache    Herpes    History of hiatal hernia 10/2021   5 cm hiatal hernia   History of kidney stones    Hypertension    no longer on medications   Hypokalemia    Left anterior  fascicular block 11/11/2021   noted on EKG   Scoliosis    Seizures (HCC)    Manifesting with aphasia   Severe protein-calorie malnutrition (HCC)    Stroke [facility])    Past Surgical History:  Procedure Laterality Date   BIOPSY  03/17/2023   Procedure: BIOPSY;  Surgeon: Jeani Hawking, MD;  Location: [facility];  Service: Gastroenterology;;   CEREBRAL ANEURYSM REPAIR     1998   COLONOSCOPY     ECTOPIC PREGNANCY SURGERY     x2    ENTEROSCOPY N/A 03/01/2020   Procedure: ENTEROSCOPY;  Surgeon: Jeani Hawking, MD;  Location: [facility];  Service: Endoscopy;  Laterality: N/A;   ENTEROSCOPY N/A 11/14/2021   Procedure: ENTEROSCOPY;  Surgeon: Jeani Hawking, MD;  Location: [facility];  Service: Endoscopy;  Laterality: N/A;   ENTEROSCOPY N/A 03/17/2023   Procedure: ENTEROSCOPY;  Surgeon: Jeani Hawking, MD;  Location: [facility];  Service: Gastroenterology;  Laterality: N/A;   ESOPHAGOGASTRODUODENOSCOPY N/A 04/22/2016   Procedure: ESOPHAGOGASTRODUODENOSCOPY (EGD);  Surgeon: Charna Elizabeth, MD;  Location: [facility];  Service: Endoscopy;  Laterality: N/A;   ESOPHAGOGASTRODUODENOSCOPY (EGD) WITH PROPOFOL N/A 11/12/2021   Procedure: ESOPHAGOGASTRODUODENOSCOPY (EGD) WITH PROPOFOL;  Surgeon: Jeani Hawking, MD;  Location: [facility];  Service: Endoscopy;  Laterality: N/A;   FEMORAL HERNIA REPAIR Right 07/02/2022   Procedure: Laparoscopic right femoral hernia with mesh;  Surgeon: Axel Filler, MD;  Location: [facility] ORS;  Service: General;  Laterality: Right;   GIVENS CAPSULE STUDY N/A 11/12/2021   Procedure: GIVENS CAPSULE STUDY;  Surgeon: Jeani Hawking, MD;  Location: [facility];  Service: Endoscopy;  Laterality: N/A;   IR 3D INDEPENDENT WKST  09/05/2018   IR ANGIO INTRA EXTRACRAN SEL COM CAROTID INNOMINATE BILAT MOD SED  09/05/2018   IR ANGIO VERTEBRAL SEL VERTEBRAL UNI R MOD SED  09/05/2018   IR ANGIOGRAM FOLLOW UP STUDY  09/05/2018   IR RADIOLOGIST EVAL & MGMT  03/28/2021   IR TRANSCATH/EMBOLIZ  09/05/2018    PARATHYROIDECTOMY     2002   RADIOLOGY WITH ANESTHESIA N/A 09/05/2018   Procedure: EMBOLIZATION;  Surgeon: Radiologist, Medication, MD;  Location: [facility] OR;  Service: Radiology;  Laterality: N/A;   TONSILLECTOMY     1953   VAGINAL HYSTERECTOMY     2001   VOCAL CORD LATERALIZATION, ENDOSCOPIC APPROACH W/ MLB     2007 (@[facility] )   WRIST SURGERY     left side/2008   WRIST SURGERY     right side/ 2002   Social History:  reports that she has quit smoking. Her smoking use included cigarettes. She has never used smokeless tobacco. She reports that she does not drink alcohol and does not use drugs.  Allergies  Allergen Reactions   Sulfa Antibiotics Other (See Comments)    Reaction:  Unknown    Tramadol Other (See Comments)    seizures    Family History  Problem Relation Age of Onset   Cancer Mother    Suicidality Father      Prior to Admission medications   Medication Sig Start Date End Date Taking? Authorizing Provider  diphenoxylate-atropine (LOMOTIL) 2.5-0.025 MG tablet SMARTSIG:2 Capsule(s) By Mouth 4 Times Daily PRN 10/20/23  Yes [provider]  escitalopram (LEXAPRO) 10 MG tablet Take 10 mg by mouth daily. 12/16/23  Yes [provider]  megestrol (MEGACE) 20 MG tablet Take 20 mg by mouth 2 (two) times daily. 12/16/23  Yes [provider]  acetaminophen (TYLENOL) 500 MG tablet Take 500-1,000 mg by mouth every 6 (six) hours as needed for moderate pain or mild pain.    [provider]  atorvastatin (LIPITOR) 40 MG tablet Take 1 tablet (40 mg total) by mouth daily. 03/18/23   Amin, Ankit C, MD  Camphor-Menthol-Methyl Sal (SALONPAS) 3.10-03-08 % PTCH Apply 1 patch topically 2 (two) times daily as needed (For pain).    [provider]  docusate sodium (COLACE) 100 MG capsule Take 1 capsule (100 mg total) by mouth 2 (two) times daily. 03/18/23   Amin, Ankit C, MD  ferrous sulfate 325 (65 FE) MG EC tablet Take 1 tablet (325 mg total) by mouth daily  with breakfast. 03/18/23 03/17/24  Miguel Rota, MD  folic acid (FOLVITE) 1 MG tablet Take 1 tablet (1 mg total) by mouth daily. 11/15/21   Albertine Grates, MD  gabapentin (NEURONTIN) 100 MG capsule Take 100 mg by mouth 2 (two) times daily.    [provider]  HYDROcodone-acetaminophen (NORCO/VICODIN) 5-325 MG tablet Take 1 tablet by mouth 2 (two) times daily.    [provider]  meloxicam (MOBIC) 15 MG tablet Take 1 tablet by mouth daily.    [provider]  mirtazapine (REMERON) 7.5 MG tablet Take 1 tablet (7.5 mg total) by mouth at bedtime. 08/26/21   Willeen Niece, MD  omeprazole (PRILOSEC) 40 MG capsule Take 40 mg by mouth daily. 03/23/23  [provider]  pantoprazole (PROTONIX) 40 MG tablet Take 1 tablet (40 mg total) by mouth daily before breakfast. 03/19/23   Amin, Ankit C, MD  potassium chloride SA (KLOR-CON M) 20 MEQ tablet Take 20 mEq by mouth daily.    [provider]  thiamine 100 MG tablet Take 1 tablet (100 mg total) by mouth daily. 11/14/21   Albertine Grates, MD  Vitamin D, Ergocalciferol, (DRISDOL) 1.25 MG (50000 UNIT) CAPS capsule Take 50,000 Units by mouth once a week. 04/23/23   [provider]    Physical Exam: BP (!) 142/107 (BP Location: Left Arm)   Pulse 70   Temp 98.7 F (37.1 C) (Oral)   Resp (!) 24   Ht 5\' 4"  (1.626 m)   Wt 43 kg   SpO2 99%   BMI 16.27 kg/m  General:  Alert, oriented, calm, in no acute distress, thin elderly female at rest on room air.  Her daughter at the bedside. Cardiovascular: RRR, no murmurs or rubs, no peripheral edema  Respiratory: clear to auscultation bilaterally, no wheezes, no crackles  Abdomen: soft, nontender, nondistended, normal bowel tones heard  Skin: dry, no rashes  Musculoskeletal: no joint effusions, normal range of motion  Psychiatric: appropriate affect, normal speech  Neurologic: extraocular muscles intact, clear speech, moving all extremities with intact sensorium         Labs  on Admission:  Basic Metabolic Panel: Recent Labs  Lab 12/24/23 1112  NA 140  K 2.5*  CL 100  CO2 28  GLUCOSE 111*  BUN 18  CREATININE 0.58  CALCIUM 9.4   Liver Function Tests: Recent Labs  Lab 12/24/23 1112  AST 19  ALT 14  ALKPHOS 88  BILITOT 1.0  PROT 7.2  ALBUMIN 3.7   No results for input(s): "LIPASE", "AMYLASE" in the last 168 hours. No results for input(s): "AMMONIA" in the last 168 hours. CBC: Recent Labs  Lab 12/24/23 1112  WBC 7.0  NEUTROABS 5.7  HGB 13.9  HCT 42.3  MCV 87.0  PLT 272   Cardiac Enzymes: No results for input(s): "CKTOTAL", "CKMB", "CKMBINDEX", "TROPONINI" in the last 168 hours. BNP (last 3 results) No results for input(s): "BNP" in the last 8760 hours.  ProBNP (last 3 results) No results for input(s): "PROBNP" in the last 8760 hours.  CBG: No results for input(s): "GLUCAP" in the last 168 hours.  Radiological Exams on Admission: DG Pelvis Portable Result Date: 12/24/2023 CLINICAL DATA:  Fall. EXAM: PORTABLE PELVIS 1-2 VIEWS COMPARISON:  August 22, 2021. FINDINGS: There is no evidence of pelvic fracture or diastasis. No pelvic bone lesions are seen. IMPRESSION: Negative. Electronically Signed   By: Lupita Raider M.D.   On: 12/24/2023 12:55   DG Chest Portable 1 View Result Date: 12/24/2023 CLINICAL DATA:  Fall. EXAM: PORTABLE CHEST 1 VIEW COMPARISON:  November 11, 2021. FINDINGS: Severe dextroscoliosis of thoracic spine is again noted. Old right rib fracture is noted. Stable cardiomediastinal silhouette. No acute pulmonary disease is noted. IMPRESSION: No active disease. Electronically Signed   By: Lupita Raider M.D.   On: 12/24/2023 12:54   CT THORACIC SPINE WO CONTRAST Result Date: 12/24/2023 CLINICAL DATA:  Back trauma, no prior imaging (Age >= 16y); Back trauma, no prior imaging (Age >= 16y) pain s/p fall, h/o scoliosis. EXAM: CT THORACIC AND LUMBAR SPINE WITHOUT CONTRAST TECHNIQUE: Multidetector CT imaging of the thoracic and  lumbar spine was performed without contrast. Multiplanar CT image reconstructions were also generated. RADIATION DOSE  REDUCTION: This exam was performed according to the departmental dose-optimization program which includes automated exposure control, adjustment of the mA and/or kV according to patient size and/or use of iterative reconstruction technique. COMPARISON:  Thoracic and lumbar spine radiographs 08/22/2021. Chest CT 03/06/2021. FINDINGS: CT THORACIC SPINE FINDINGS Alignment: Unchanged severe dextroscoliotic curvature of the thoracic spine. Vertebrae: Normal vertebral body heights. No acute vertebral fracture or suspicious bone lesion. Subacute fracture of the right sixth rib. Paraspinal and other soft tissues: Unchanged 7 mm solid nodule in lateral aspect of the right lower lobe (axial image 76 series 9). Moderate hiatal hernia. Disc levels: Within limitations of severe dextroscoliotic curvature, no high-grade spinal canal stenosis or neural foraminal narrowing. CT LUMBAR SPINE FINDINGS Segmentation: Conventional numbering is assumed with 5 non-rib-bearing, lumbar type vertebral bodies. Diminutive ribs at T12. Alignment: Unchanged severe levoscoliotic curvature of the lumbar spine with left lateral listhesis L4 on L5 and right lateral listhesis of L1 on L2 and L3 on L4. Vertebrae: Normal vertebral body heights. No acute fracture. Multilevel degenerative endplate changes. Paraspinal and other soft tissues: Bilateral renal calculi, measuring up to 5 mm on the left. Cholelithiasis. Atherosclerotic calcifications of the abdominal aorta and its branches. Disc levels: Multilevel lumbar spondylosis, worst at L1-2, where there is mild spinal canal stenosis and moderate bilateral neural foraminal narrowing. IMPRESSION: 1. No acute fracture or traumatic listhesis of the thoracic or lumbar spine. 2. Subacute fracture of the right sixth rib. 3. Unchanged 7 mm solid nodule in the right lower lobe. 4. Bilateral renal  calculi, measuring up to 5 mm on the left. 5. Cholelithiasis. Electronically Signed   By: Orvan Falconer M.D.   On: 12/24/2023 12:43   CT Lumbar Spine Wo Contrast Result Date: 12/24/2023 CLINICAL DATA:  Back trauma, no prior imaging (Age >= 16y); Back trauma, no prior imaging (Age >= 16y) pain s/p fall, h/o scoliosis. EXAM: CT THORACIC AND LUMBAR SPINE WITHOUT CONTRAST TECHNIQUE: Multidetector CT imaging of the thoracic and lumbar spine was performed without contrast. Multiplanar CT image reconstructions were also generated. RADIATION DOSE REDUCTION: This exam was performed according to the departmental dose-optimization program which includes automated exposure control, adjustment of the mA and/or kV according to patient size and/or use of iterative reconstruction technique. COMPARISON:  Thoracic and lumbar spine radiographs 08/22/2021. Chest CT 03/06/2021. FINDINGS: CT THORACIC SPINE FINDINGS Alignment: Unchanged severe dextroscoliotic curvature of the thoracic spine. Vertebrae: Normal vertebral body heights. No acute vertebral fracture or suspicious bone lesion. Subacute fracture of the right sixth rib. Paraspinal and other soft tissues: Unchanged 7 mm solid nodule in lateral aspect of the right lower lobe (axial image 76 series 9). Moderate hiatal hernia. Disc levels: Within limitations of severe dextroscoliotic curvature, no high-grade spinal canal stenosis or neural foraminal narrowing. CT LUMBAR SPINE FINDINGS Segmentation: Conventional numbering is assumed with 5 non-rib-bearing, lumbar type vertebral bodies. Diminutive ribs at T12. Alignment: Unchanged severe levoscoliotic curvature of the lumbar spine with left lateral listhesis L4 on L5 and right lateral listhesis of L1 on L2 and L3 on L4. Vertebrae: Normal vertebral body heights. No acute fracture. Multilevel degenerative endplate changes. Paraspinal and other soft tissues: Bilateral renal calculi, measuring up to 5 mm on the left. Cholelithiasis.  Atherosclerotic calcifications of the abdominal aorta and its branches. Disc levels: Multilevel lumbar spondylosis, worst at L1-2, where there is mild spinal canal stenosis and moderate bilateral neural foraminal narrowing. IMPRESSION: 1. No acute fracture or traumatic listhesis of the thoracic or lumbar spine. 2. Subacute fracture of  the right sixth rib. 3. Unchanged 7 mm solid nodule in the right lower lobe. 4. Bilateral renal calculi, measuring up to 5 mm on the left. 5. Cholelithiasis. Electronically Signed   By: Orvan Falconer M.D.   On: 12/24/2023 12:43   CT Head Wo Contrast Result Date: 12/24/2023 CLINICAL DATA:  Neck trauma.  Weakness with fall. EXAM: CT HEAD WITHOUT CONTRAST CT CERVICAL SPINE WITHOUT CONTRAST TECHNIQUE: Multidetector CT imaging of the head and cervical spine was performed following the standard protocol without intravenous contrast. Multiplanar CT image reconstructions of the cervical spine were also generated. RADIATION DOSE REDUCTION: This exam was performed according to the departmental dose-optimization program which includes automated exposure control, adjustment of the mA and/or kV according to patient size and/or use of iterative reconstruction technique. COMPARISON:  11/11/2021 FINDINGS: CT HEAD FINDINGS Brain: No evidence of acute infarction, hemorrhage, hydrocephalus, extra-axial collection or mass lesion/mass effect. 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 Chronic ventriculomegaly and confluent periventricular chronic small vessel ischemia. Vascular: Right vertebral stent Skull: Right suboccipital craniectomy.  No acute finding Sinuses/Orbits: Negative CT CERVICAL SPINE FINDINGS Alignment: Scoliosis.  No traumatic malalignment Skull base and vertebrae: No acute fracture. No primary bone lesion or focal pathologic process. Soft tissues and spinal canal: No prevertebral fluid or swelling.  No visible canal hematoma. Indication of prior vocal cord augmentation on the right. Disc levels:  Generalized degenerative endplate and facet spurring. Upper chest: Band of scar-like density at the right apex. IMPRESSION: No evidence of acute intracranial or cervical spine injury. Chronic and postoperative changes as described. Electronically Signed   By: Tiburcio Pea M.D.   On: 12/24/2023 12:33   CT Cervical Spine Wo Contrast Result Date: 12/24/2023 CLINICAL DATA:  Neck trauma.  Weakness with fall. EXAM: CT HEAD WITHOUT CONTRAST CT CERVICAL SPINE WITHOUT CONTRAST TECHNIQUE: Multidetector CT imaging of the head and cervical spine was performed following the standard protocol without intravenous contrast. Multiplanar CT image reconstructions of the cervical spine were also generated. RADIATION DOSE REDUCTION: This exam was performed according to the departmental dose-optimization program which includes automated exposure control, adjustment of the mA and/or kV according to patient size and/or use of iterative reconstruction technique. COMPARISON:  11/11/2021 FINDINGS: CT HEAD FINDINGS Brain: No evidence of acute infarction, hemorrhage, hydrocephalus, extra-axial collection or mass lesion/mass effect. 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 Chronic ventriculomegaly and confluent periventricular chronic small vessel ischemia. Vascular: Right vertebral stent Skull: Right suboccipital craniectomy.  No acute finding Sinuses/Orbits: Negative CT CERVICAL SPINE FINDINGS Alignment: Scoliosis.  No traumatic malalignment Skull base and vertebrae: No acute fracture. No primary bone lesion or focal pathologic process. Soft tissues and spinal canal: No prevertebral fluid or swelling. No visible canal hematoma. Indication of prior vocal cord augmentation on the right. Disc levels:  Generalized degenerative endplate and facet spurring.  Upper chest: Band of scar-like density at the right apex. IMPRESSION: No evidence of acute intracranial or cervical spine injury. Chronic and postoperative changes as described. Electronically Signed   By: Tiburcio Pea M.D.   On: 12/24/2023 12:33   Assessment/Plan Elaine Rivera is a 78 y.o. female with medical history significant for depression, dementia, prior CVA, GI bleeding, iron deficiency anemia being admitted to the hospital for observation after fall at home.   Fall at home-this appears to be mechanical, patient is also  quite weak and daughter states she has hardly been eating.  She also has some electrolyte derangements which may be contributing to her weakness. -Observation admission -Replete electrolytes as below -PT/OT, TOC consult she may benefit from placement  Hyperlipidemia-Lipitor  Hypokalemia-likely due to GI losses and poor p.o. intake, repleted orally and will also continue her home potassium supplementation from the morning  Dementia-Remeron  Iron deficiency anemia-continue iron supplementation  Neuropathy-continue gabapentin twice daily  DVT prophylaxis: Lovenox     Code Status: Full Code  Consults called: None  Admission status: Observation  Time spent: 48 minutes  Madeeha Costantino Sharlette Dense MD [facility] Pager [phone]  If 7PM-7AM, please contact night-coverage [email or web address] Password [facility]  12/24/2023, 2:59 PM

## 2023-12-24 NOTE — Progress Notes (Signed)
 1525  Pt arrived to 1607 from ED. Pt A & O x 4. Pt denies pain. RA. Pt does report continuing to have diarrhea for several days and is asking for "something to help it stop." Dr Kirby Crigler notified via secure chat.  Per Angelica Chessman, RN pt had not received either of the 2 doses of potassium ordered. Potassium sent with pt from the ED.   1625 First bag of Potassium started at this time.    1500 scheduled doses of medications not given due to medications not being verified by pharmacist.

## 2023-12-24 NOTE — Progress Notes (Signed)
 Dr Kirby Crigler notified of pt having a foley in place, but no order. Awaiting order now.

## 2023-12-24 NOTE — ED Triage Notes (Signed)
 Pt was walking to the restroom while using her walker. Pt states that she started to feel weakness in her legs and fell. Pt denies hitting her head or loss of conscious. Pt is alert and oriented X4.

## 2023-12-25 DIAGNOSIS — E785 Hyperlipidemia, unspecified: Secondary | ICD-10-CM | POA: Diagnosis not present

## 2023-12-25 DIAGNOSIS — W19XXXA Unspecified fall, initial encounter: Secondary | ICD-10-CM | POA: Diagnosis not present

## 2023-12-25 LAB — CBC
HCT: 42.6 % (ref 36.0–46.0)
Hemoglobin: 13.3 g/dL (ref 12.0–15.0)
MCH: 28.2 pg (ref 26.0–34.0)
MCHC: 31.2 g/dL (ref 30.0–36.0)
MCV: 90.3 fL (ref 80.0–100.0)
Platelets: 259 10*3/uL (ref 150–400)
RBC: 4.72 MIL/uL (ref 3.87–5.11)
RDW: 12.5 % (ref 11.5–15.5)
WBC: 5.5 10*3/uL (ref 4.0–10.5)
nRBC: 0 % (ref 0.0–0.2)

## 2023-12-25 LAB — BASIC METABOLIC PANEL WITH GFR
Anion gap: 10 (ref 5–15)
Anion gap: 5 (ref 5–15)
BUN: 10 mg/dL (ref 8–23)
BUN: 11 mg/dL (ref 8–23)
CO2: 25 mmol/L (ref 22–32)
CO2: 29 mmol/L (ref 22–32)
Calcium: 9 mg/dL (ref 8.9–10.3)
Calcium: 8.8 mg/dL — ABNORMAL LOW (ref 8.9–10.3)
Chloride: 105 mmol/L (ref 98–111)
Chloride: 104 mmol/L (ref 98–111)
Creatinine, Ser: 0.54 mg/dL (ref 0.44–1.00)
Creatinine, Ser: 0.62 mg/dL (ref 0.44–1.00)
GFR, Estimated: >60 mL/min (ref >60)
GFR, Estimated: >60 mL/min (ref >60)
Glucose, Bld: 99 mg/dL (ref 70–99)
Glucose, Bld: 89 mg/dL (ref 70–99)
Potassium: 2.6 mmol/L — CL (ref 3.5–5.1)
Potassium: 3.7 mmol/L (ref 3.5–5.1)
Sodium: 140 mmol/L (ref 135–145)
Sodium: 138 mmol/L (ref 135–145)

## 2023-12-25 MED ORDER — POTASSIUM CHLORIDE 20 MEQ PO PACK
40.0000 meq | PACK | Freq: Once | ORAL | Status: AC
  Administered 2023-12-25: 40 meq via ORAL
  Filled 2023-12-25: qty 2

## 2023-12-25 MED ORDER — CHLORHEXIDINE GLUCONATE CLOTH 2 % EX PADS
6.0000 | MEDICATED_PAD | Freq: Every day | CUTANEOUS | Status: DC
  Administered 2023-12-25 – 2023-12-28 (×4): 6 via TOPICAL

## 2023-12-25 NOTE — Evaluation (Signed)
 Occupational Therapy Evaluation Patient Details Name: Elaine Rivera MRN: [id number] DOB: 1945-12-24 Today's Date: 12/25/2023   History of Present Illness   Patient is a 78 year old female who presented to ED on 3/28 s/p fall at home.daughter reported that she went over to check on mother and was told that she had a fall.  patient has also been having some persistent diarrhea. PMH: depression, dementia, prior CVA, GI bleeding, iron deficiency anemia, scoliosis.     Clinical Impressions Patient is a 78 year old female who was admitted for above. Patient was living at home with friend who was supporting patient for ADLs. Patients daughter reported that patient has spent increased ammount of time in bed over last two months after recent d/c from skilled stay. Patient was noted to have decreased functional activity tolernace, decreased ROM, decreased BUE strength, decreased endurance, decreased sitting balance, decreased standing balanced, decreased safety awareness, and decreased knowledge of AE/AD impacting participation in ADLs. Patient will benefit from continued inpatient follow up therapy, <3 hours/day.      If plan is discharge home, recommend the following:   Two people to help with walking and/or transfers;Two people to help with bathing/dressing/bathroom;Assistance with cooking/housework;Direct supervision/assist for medications management;Assist for transportation;Help with stairs or ramp for entrance;Direct supervision/assist for financial management;Assistance with feeding     Functional Status Assessment   Patient has had a recent decline in their functional status and/or demonstrates limited ability to make significant improvements in function in a reasonable and predictable amount of time     Equipment Recommendations   None recommended by OT      Precautions/Restrictions   Precautions Precautions: Fall Restrictions Weight Bearing Restrictions Per Provider  Order: No     Mobility Bed Mobility               General bed mobility comments: patient was mod A for repositioning in bed to transition into chair position             ADL either performed or assessed with clinical judgement   ADL Overall ADL's : Needs assistance/impaired Eating/Feeding: Set up;Supervision/ safety;Bed level Eating/Feeding Details (indicate cue type and reason): patient was positioned in chair position in bed for lunch. patient was able to set up her baked potato with increased time daughter present. patient was able to pick up styrofoam cup and place it back on table with no spillage. Grooming: Sitting;Moderate assistance Grooming Details (indicate cue type and reason): noted to have increased knots in hair from being at bed level for extended periods of time. patient's daughter reported she would get some detangler at store and bring in. Upper Body Bathing: Bed level;Maximal assistance   Lower Body Bathing: Bed level;Total assistance   Upper Body Dressing : Bed level;Maximal assistance   Lower Body Dressing: Bed level;Total assistance     Toilet Transfer Details (indicate cue type and reason): deferred Toileting- Clothing Manipulation and Hygiene: Bed level;Total assistance               Vision   Vision Assessment?: No apparent visual deficits            Pertinent Vitals/Pain Pain Assessment Pain Assessment: Faces Faces Pain Scale: Hurts a little bit Pain Location: back Pain Descriptors / Indicators: Grimacing Pain Intervention(s): Monitored during session, Repositioned     Extremity/Trunk Assessment Upper Extremity Assessment Upper Extremity Assessment: RUE deficits/detail;LUE deficits/detail RUE Deficits / Details: able to range to about 90 degrees FF. muscle waisting noted. grip strength [facility]  LUE Deficits / Details: able to range to about 90 degrees FF. muscle waisting noted. grip strength WFL   Lower Extremity Assessment Lower  Extremity Assessment: Defer to PT evaluation   Cervical / Trunk Assessment Cervical / Trunk Assessment: Kyphotic;Other exceptions Cervical / Trunk Exceptions: severe scoliosis observed   Communication Communication Communication: No apparent difficulties   Cognition Arousal: Alert Behavior During Therapy: Flat affect Cognition: History of cognitive impairments         Following commands: Intact                  Home Living Family/patient expects to be discharged to:: Private residence Living Arrangements: Alone Available Help at Discharge: Family;Available PRN/intermittently Type of Home: House       Home Layout: One level     Bathroom Shower/Tub: Tub/shower unit         Home Equipment: Rollator (4 wheels)          Prior Functioning/Environment Prior Level of Function : Independent/Modified Independent             Mobility Comments: pt reports multiple falls prior to admission, uses rollator at baseline ADLs Comments: patients daughter reported friend had moved in to help with caregiving but was not "doing a good job"    OT Problem List: Decreased strength;Impaired balance (sitting and/or standing);Decreased knowledge of precautions;Decreased safety awareness;Decreased knowledge of use of DME or AE;Decreased activity tolerance   OT Treatment/Interventions: Self-care/ADL training;DME and/or AE instruction;Therapeutic activities;Balance training;Therapeutic exercise;Energy conservation;Patient/family education      OT Goals(Current goals can be found in the care plan section)   Acute Rehab OT Goals Patient Stated Goal: none stated OT Goal Formulation: With patient/family Time For Goal Achievement: 01/08/24 Potential to Achieve Goals: Fair   OT Frequency:  Min 2X/week       AM-PAC OT "6 Clicks" Daily Activity     Outcome Measure Help from another person eating meals?: A Little Help from another person taking care of personal grooming?: A  Lot Help from another person toileting, which includes using toliet, bedpan, or urinal?: Total Help from another person bathing (including washing, rinsing, drying)?: Total Help from another person to put on and taking off regular upper body clothing?: Total Help from another person to put on and taking off regular lower body clothing?: Total 6 Click Score: 9   End of Session    Activity Tolerance: Patient tolerated treatment well Patient left: in bed;with call bell/phone within reach;with bed alarm set;with family/visitor present  OT Visit Diagnosis: Unsteadiness on feet (R26.81);Muscle weakness (generalized) (M62.81);Feeding difficulties (R63.3);History of falling (Z91.81)                Time: 1610-9604 OT Time Calculation (min): 16 min Charges:  OT General Charges $OT Visit: 1 Visit OT Evaluation $OT Eval Low Complexity: 1 Low  Mykelti Goldenstein OTR/L, MS Acute Rehabilitation Department Office# [phone]   Selinda Flavin 12/25/2023, 1:55 PM

## 2023-12-25 NOTE — Plan of Care (Signed)
  Problem: Education: Goal: Knowledge of General Education information will improve Description: Including pain rating scale, medication(s)/side effects and non-pharmacologic comfort measures Outcome: Progressing   Problem: Clinical Measurements: Goal: Ability to maintain clinical measurements within normal limits will improve Outcome: Progressing Goal: Respiratory complications will improve Outcome: Progressing Goal: Cardiovascular complication will be avoided Outcome: Progressing   Problem: Activity: Goal: Risk for activity intolerance will decrease Outcome: Progressing   Problem: Pain Managment: Goal: General experience of comfort will improve and/or be controlled Outcome: Progressing   Problem: Safety: Goal: Ability to remain free from injury will improve Outcome: Progressing   Problem: Skin Integrity: Goal: Risk for impaired skin integrity will decrease Outcome: Progressing

## 2023-12-25 NOTE — Evaluation (Signed)
 Physical Therapy Evaluation Patient Details Name: Elaine Rivera MRN: [id number] DOB: 06/03/1946 Today's Date: 12/25/2023  History of Present Illness  78 y.o. female presented to ED s/p fall at home. Daughter reports that her mother is having progressively worsening dementia over the last decade. The patient lives on her own, She has refused prior attempts to move her to a higher level of care. Lately, she has been having some persistent diarrhea, apparently fell at home.  Past medical history significant for depression, dementia, prior CVA, GI bleeding, iron deficiency anemia, scoliosis.  Daughter reports that her mother is having progressively worsening dementia over the last decade.  The patient lives on her own, She has refused prior attempts to move her to a higher level of care.  Lately, she has been having some persistent diarrhea, apparently fell at home yesterday.  Clinical Impression  Pt admitted with above diagnosis.  Pt currently with functional limitations due to the deficits listed below (see PT Problem List). Pt will benefit from acute skilled PT to increase their independence and safety with mobility to allow discharge.  Pt overall reports feeling poorly and presents with cachectic appearance.  Pt assisted to standing however LEs buckling and pt unable to take a step.  Pt assisted back to bed per request.  Pt reports she is from home alone and has had a few falls prior to admission.  Pt appears agreeable to SNF upon d/c.  Patient will benefit from continued inpatient follow up therapy, <3 hours/day.          If plan is discharge home, recommend the following: A lot of help with walking and/or transfers;A lot of help with bathing/dressing/bathroom;Assistance with cooking/housework;Help with stairs or ramp for entrance;Assist for transportation   Can travel by private vehicle   No    Equipment Recommendations None recommended by PT  Recommendations for Other Services        Functional Status Assessment Patient has had a recent decline in their functional status and demonstrates the ability to make significant improvements in function in a reasonable and predictable amount of time.     Precautions / Restrictions Precautions Precautions: Fall      Mobility  Bed Mobility Overal bed mobility: Needs Assistance Bed Mobility: Rolling, Sidelying to Sit, Sit to Sidelying Rolling: Contact guard assist Sidelying to sit: Min assist     Sit to sidelying: Min assist General bed mobility comments: pt self performs log roll technique; assist for trunk upright and LEs onto bed (pt appears to prefer LEs in flexed posture)    Transfers Overall transfer level: Needs assistance Equipment used: Rolling walker (2 wheels) Transfers: Sit to/from Stand Sit to Stand: Mod assist, +2 physical assistance           General transfer comment: verbal cues for safe technique, buckling of LEs observed; pt unable to take side steps up [facility]; assist for weakness    Ambulation/Gait                  Stairs            Wheelchair Mobility     Tilt Bed    Modified Rankin (Stroke Patients Only)       Balance Overall balance assessment: History of Falls  Pertinent Vitals/Pain Pain Assessment Pain Assessment: 0-10 Pain Score: 5  Pain Location: back Pain Descriptors / Indicators: Sore, Grimacing, Guarding Pain Intervention(s): Repositioned, Monitored during session    Home Living Family/patient expects to be discharged to:: Private residence Living Arrangements: Alone   Type of Home: House         Home Layout: One level Home Equipment: Rollator (4 wheels)      Prior Function Prior Level of Function : Independent/Modified Independent             Mobility Comments: pt reports multiple falls prior to admission, uses rollator at baseline       Extremity/Trunk Assessment   Upper  Extremity Assessment Upper Extremity Assessment: Generalized weakness    Lower Extremity Assessment Lower Extremity Assessment: Generalized weakness    Cervical / Trunk Assessment Cervical / Trunk Assessment: Kyphotic;Other exceptions Cervical / Trunk Exceptions: severe scoliosis observed  Communication   Communication Communication: No apparent difficulties    Cognition Arousal: Alert Behavior During Therapy: Flat affect   PT - Cognitive impairments: History of cognitive impairments                       PT - Cognition Comments: hx dementia Following commands: Intact       Cueing       General Comments      Exercises     Assessment/Plan    PT Assessment Patient needs continued PT services  PT Problem List Decreased strength;Decreased activity tolerance;Decreased mobility;Decreased balance;Decreased knowledge of use of DME;Decreased safety awareness       PT Treatment Interventions DME instruction;Gait training;Balance training;Neuromuscular re-education;Functional mobility training;Therapeutic activities;Therapeutic exercise;Patient/family education    PT Goals (Current goals can be found in the Care Plan section)  Acute Rehab PT Goals PT Goal Formulation: With patient Time For Goal Achievement: 01/08/24 Potential to Achieve Goals: Good    Frequency Min 2X/week     Co-evaluation               AM-PAC PT "6 Clicks" Mobility  Outcome Measure Help needed turning from your back to your side while in a flat bed without using bedrails?: A Little Help needed moving from lying on your back to sitting on the side of a flat bed without using bedrails?: A Lot Help needed moving to and from a bed to a chair (including a wheelchair)?: A Lot Help needed standing up from a chair using your arms (e.g., wheelchair or bedside chair)?: A Lot Help needed to walk in hospital room?: Total Help needed climbing 3-5 steps with a railing? : Total 6 Click Score:  11    End of Session Equipment Utilized During Treatment: Gait belt Activity Tolerance: Patient tolerated treatment well Patient left: with call bell/phone within reach;with bed alarm set;in bed   PT Visit Diagnosis: Muscle weakness (generalized) (M62.81);Adult, failure to thrive (R62.7);Other abnormalities of gait and mobility (R26.89)    Time: 1610-9604 PT Time Calculation (min) (ACUTE ONLY): 9 min   Charges:   PT Evaluation $PT Eval Low Complexity: 1 Low   PT General Charges $$ ACUTE PT VISIT: 1 Visit        Thomasene Mohair PT, DPT Physical Therapist Acute Rehabilitation Services Office: [phone]   Kati L Payson 12/25/2023, 1:07 PM

## 2023-12-25 NOTE — Progress Notes (Signed)
 PROGRESS NOTE    Elaine Rivera  UJW:[id number] DOB: [birth year] DOA: 12/24/2023  PCP: Saundra Shelling, NP   Brief Narrative:  This 78 y.o. female with medical history significant for depression, dementia, prior CVA, GI bleeding, iron deficiency anemia presented in ED s/p fall at home.Daughter reports that her mother is having progressively worsening dementia over the last decade.  The patient lives on her own, She has refused prior attempts to move her to a higher level of care.  Lately, she has been having some persistent diarrhea, apparently fell at home yesterday. She states that she was standing in her bathroom and her legs just gave out, she fell onto her back, denies hitting her head, or losing consciousness.Otherwise, the patient and her daughter deny any recent illness or other acute concerns.  Workup in the ER including imaging as noted below and lab work is relatively benign, though she does have some significant hypokalemia.  Given her advanced age and multiple falls at home, Patient was admitted for further evaluation.  Assessment & Plan:   Principal Problem:   Accident due to mechanical fall without injury  Status post fall at home: Patient lives by self, fell in the morning in the bathroom,  denies any head injury or loss of consciousness. Appears to be mechanical, Patient quite weak and daughter states she is hardly been eating. She also has some electrolyte derangements which may be contributing to her weakness. Replace electrolytes, IV hydration. PT/OT, TOC consult she may benefit from placement.   Hyperlipidemia: Continue Lipitor.   Hypokalemia: Likely due to GI losses and poor p.o. intake, repleted orally. Continue to monitor.   Dementia : Continue Remeron. There is no behavioral disturbance.   Iron deficiency anemia: continue iron supplementation   Neuropathy: Continue gabapentin twice daily  DVT prophylaxis: Lovenox Code Status: Full code Family  Communication: No family at bedside Disposition Plan:    Status is: Observation The patient remains OBS appropriate and will d/c before 2 midnights.   Admitted for recurrent falls needs PT and OT evaluation for disposition.  Consultants:  None  Procedures: None  Antimicrobials:  Anti-infectives (From admission, onward)    None      Subjective: Patient was seen and examined at bedside.  Overnight events noted. Patient reports feeling improved.  She is fully oriented and following commands.  Objective: Vitals:   12/24/23 1521 12/24/23 2024 12/25/23 0020 12/25/23 0429  BP: (!) 164/88 129/73 130/75 (!) 161/84  Pulse: (!) 59 64 64 72  Resp: 16 14 14 14   Temp:  98.6 F (37 C) 98 F (36.7 C) 98.7 F (37.1 C)  TempSrc:  Oral Oral Oral  SpO2: 99% 98% 95% 95%  Weight:      Height:        Intake/Output Summary (Last 24 hours) at 12/25/2023 1202 Last data filed at 12/25/2023 0446 Gross per 24 hour  Intake --  Output 750 ml  Net -750 ml   Filed Weights   12/24/23 1117  Weight: 43 kg    Examination:  General exam: Appears calm and comfortable, deconditioned, not in any acute distress. Respiratory system: Clear to auscultation. Respiratory effort normal.  RR 15 Cardiovascular system: S1 & S2 heard, RRR. No JVD, murmurs, rubs, gallops or clicks. Gastrointestinal system: Abdomen is non distended, soft and non tender.  Normal bowel sounds heard. Central nervous system: Alert and oriented X 2. No focal neurological deficits. Extremities: No edema, no cyanosis, no clubbing Skin: No rashes, lesions or  ulcers Psychiatry: Judgement and insight appear normal. Mood & affect appropriate.     Data Reviewed: I have personally reviewed following labs and imaging studies  CBC: Recent Labs  Lab 12/24/23 1112 12/25/23 0930  WBC 7.0 5.5  NEUTROABS 5.7  --   HGB 13.9 13.3  HCT 42.3 42.6  MCV 87.0 90.3  PLT 272 259   Basic Metabolic Panel: Recent Labs  Lab 12/24/23 1112  12/25/23 0930  NA 140 140  K 2.5* 2.6*  CL 100 105  CO2 28 25  GLUCOSE 111* 99  BUN 18 10  CREATININE 0.58 0.54  CALCIUM 9.4 9.0  MG 2.1  --    GFR: Estimated Creatinine Clearance: 40 mL/min (by C-G formula based on SCr of 0.54 mg/dL). Liver Function Tests: Recent Labs  Lab 12/24/23 1112  AST 19  ALT 14  ALKPHOS 88  BILITOT 1.0  PROT 7.2  ALBUMIN 3.7   No results for input(s): "LIPASE", "AMYLASE" in the last 168 hours. No results for input(s): "AMMONIA" in the last 168 hours. Coagulation Profile: No results for input(s): "INR", "PROTIME" in the last 168 hours. Cardiac Enzymes: No results for input(s): "CKTOTAL", "CKMB", "CKMBINDEX", "TROPONINI" in the last 168 hours. BNP (last 3 results) No results for input(s): "PROBNP" in the last 8760 hours. HbA1C: No results for input(s): "HGBA1C" in the last 72 hours. CBG: No results for input(s): "GLUCAP" in the last 168 hours. Lipid Profile: No results for input(s): "CHOL", "HDL", "LDLCALC", "TRIG", "CHOLHDL", "LDLDIRECT" in the last 72 hours. Thyroid Function Tests: No results for input(s): "TSH", "T4TOTAL", "FREET4", "T3FREE", "THYROIDAB" in the last 72 hours. Anemia Panel: No results for input(s): "VITAMINB12", "FOLATE", "FERRITIN", "TIBC", "IRON", "RETICCTPCT" in the last 72 hours. Sepsis Labs: No results for input(s): "PROCALCITON", "LATICACIDVEN" in the last 168 hours.  No results found for this or any previous visit (from the past 240 hours).   Radiology Studies: DG Pelvis Portable Result Date: 12/24/2023 CLINICAL DATA:  Fall. EXAM: PORTABLE PELVIS 1-2 VIEWS COMPARISON:  August 22, 2021. FINDINGS: There is no evidence of pelvic fracture or diastasis. No pelvic bone lesions are seen. IMPRESSION: Negative. Electronically Signed   By: Lupita Raider M.D.   On: 12/24/2023 12:55   DG Chest Portable 1 View Result Date: 12/24/2023 CLINICAL DATA:  Fall. EXAM: PORTABLE CHEST 1 VIEW COMPARISON:  November 11, 2021. FINDINGS:  Severe dextroscoliosis of thoracic spine is again noted. Old right rib fracture is noted. Stable cardiomediastinal silhouette. No acute pulmonary disease is noted. IMPRESSION: No active disease. Electronically Signed   By: Lupita Raider M.D.   On: 12/24/2023 12:54   CT THORACIC SPINE WO CONTRAST Result Date: 12/24/2023 CLINICAL DATA:  Back trauma, no prior imaging (Age >= 16y); Back trauma, no prior imaging (Age >= 16y) pain s/p fall, h/o scoliosis. EXAM: CT THORACIC AND LUMBAR SPINE WITHOUT CONTRAST TECHNIQUE: Multidetector CT imaging of the thoracic and lumbar spine was performed without contrast. Multiplanar CT image reconstructions were also generated. RADIATION DOSE REDUCTION: This exam was performed according to the departmental dose-optimization program which includes automated exposure control, adjustment of the mA and/or kV according to patient size and/or use of iterative reconstruction technique. COMPARISON:  Thoracic and lumbar spine radiographs 08/22/2021. Chest CT 03/06/2021. FINDINGS: CT THORACIC SPINE FINDINGS Alignment: Unchanged severe dextroscoliotic curvature of the thoracic spine. Vertebrae: Normal vertebral body heights. No acute vertebral fracture or suspicious bone lesion. Subacute fracture of the right sixth rib. Paraspinal and other soft tissues: Unchanged 7 mm solid  nodule in lateral aspect of the right lower lobe (axial image 76 series 9). Moderate hiatal hernia. Disc levels: Within limitations of severe dextroscoliotic curvature, no high-grade spinal canal stenosis or neural foraminal narrowing. CT LUMBAR SPINE FINDINGS Segmentation: Conventional numbering is assumed with 5 non-rib-bearing, lumbar type vertebral bodies. Diminutive ribs at T12. Alignment: Unchanged severe levoscoliotic curvature of the lumbar spine with left lateral listhesis L4 on L5 and right lateral listhesis of L1 on L2 and L3 on L4. Vertebrae: Normal vertebral body heights. No acute fracture. Multilevel  degenerative endplate changes. Paraspinal and other soft tissues: Bilateral renal calculi, measuring up to 5 mm on the left. Cholelithiasis. Atherosclerotic calcifications of the abdominal aorta and its branches. Disc levels: Multilevel lumbar spondylosis, worst at L1-2, where there is mild spinal canal stenosis and moderate bilateral neural foraminal narrowing. IMPRESSION: 1. No acute fracture or traumatic listhesis of the thoracic or lumbar spine. 2. Subacute fracture of the right sixth rib. 3. Unchanged 7 mm solid nodule in the right lower lobe. 4. Bilateral renal calculi, measuring up to 5 mm on the left. 5. Cholelithiasis. Electronically Signed   By: Orvan Falconer M.D.   On: 12/24/2023 12:43   CT Lumbar Spine Wo Contrast Result Date: 12/24/2023 CLINICAL DATA:  Back trauma, no prior imaging (Age >= 16y); Back trauma, no prior imaging (Age >= 16y) pain s/p fall, h/o scoliosis. EXAM: CT THORACIC AND LUMBAR SPINE WITHOUT CONTRAST TECHNIQUE: Multidetector CT imaging of the thoracic and lumbar spine was performed without contrast. Multiplanar CT image reconstructions were also generated. RADIATION DOSE REDUCTION: This exam was performed according to the departmental dose-optimization program which includes automated exposure control, adjustment of the mA and/or kV according to patient size and/or use of iterative reconstruction technique. COMPARISON:  Thoracic and lumbar spine radiographs 08/22/2021. Chest CT 03/06/2021. FINDINGS: CT THORACIC SPINE FINDINGS Alignment: Unchanged severe dextroscoliotic curvature of the thoracic spine. Vertebrae: Normal vertebral body heights. No acute vertebral fracture or suspicious bone lesion. Subacute fracture of the right sixth rib. Paraspinal and other soft tissues: Unchanged 7 mm solid nodule in lateral aspect of the right lower lobe (axial image 76 series 9). Moderate hiatal hernia. Disc levels: Within limitations of severe dextroscoliotic curvature, no high-grade spinal  canal stenosis or neural foraminal narrowing. CT LUMBAR SPINE FINDINGS Segmentation: Conventional numbering is assumed with 5 non-rib-bearing, lumbar type vertebral bodies. Diminutive ribs at T12. Alignment: Unchanged severe levoscoliotic curvature of the lumbar spine with left lateral listhesis L4 on L5 and right lateral listhesis of L1 on L2 and L3 on L4. Vertebrae: Normal vertebral body heights. No acute fracture. Multilevel degenerative endplate changes. Paraspinal and other soft tissues: Bilateral renal calculi, measuring up to 5 mm on the left. Cholelithiasis. Atherosclerotic calcifications of the abdominal aorta and its branches. Disc levels: Multilevel lumbar spondylosis, worst at L1-2, where there is mild spinal canal stenosis and moderate bilateral neural foraminal narrowing. IMPRESSION: 1. No acute fracture or traumatic listhesis of the thoracic or lumbar spine. 2. Subacute fracture of the right sixth rib. 3. Unchanged 7 mm solid nodule in the right lower lobe. 4. Bilateral renal calculi, measuring up to 5 mm on the left. 5. Cholelithiasis. Electronically Signed   By: Orvan Falconer M.D.   On: 12/24/2023 12:43   CT Head Wo Contrast Result Date: 12/24/2023 CLINICAL DATA:  Neck trauma.  Weakness with fall. EXAM: CT HEAD WITHOUT CONTRAST CT CERVICAL SPINE WITHOUT CONTRAST TECHNIQUE: Multidetector CT imaging of the head and cervical spine was performed following the standard  protocol without intravenous contrast. Multiplanar CT image reconstructions of the cervical spine were also generated. RADIATION DOSE REDUCTION: This exam was performed according to the departmental dose-optimization program which includes automated exposure control, adjustment of the mA and/or kV according to patient size and/or use of iterative reconstruction technique. COMPARISON:  11/11/2021 FINDINGS: CT HEAD FINDINGS Brain: No evidence of acute infarction, hemorrhage, hydrocephalus, extra-axial collection or mass lesion/mass  effect. 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 Chronic ventriculomegaly and confluent periventricular chronic small vessel ischemia. Vascular: Right vertebral stent Skull: Right suboccipital craniectomy.  No acute finding Sinuses/Orbits: Negative CT CERVICAL SPINE FINDINGS Alignment: Scoliosis.  No traumatic malalignment Skull base and vertebrae: No acute fracture. No primary bone lesion or focal pathologic process. Soft tissues and spinal canal: No prevertebral fluid or swelling. No visible canal hematoma. Indication of prior vocal cord augmentation on the right. Disc levels:  Generalized degenerative endplate and facet spurring. Upper chest: Band of scar-like density at the right apex. IMPRESSION: No evidence of acute intracranial or cervical spine injury. Chronic and postoperative changes as described. Electronically Signed   By: Tiburcio Pea M.D.   On: 12/24/2023 12:33   CT Cervical Spine Wo Contrast Result Date: 12/24/2023 CLINICAL DATA:  Neck trauma.  Weakness with fall. EXAM: CT HEAD WITHOUT CONTRAST CT CERVICAL SPINE WITHOUT CONTRAST TECHNIQUE: Multidetector CT imaging of the head and cervical spine was performed following the standard protocol without intravenous contrast. Multiplanar CT image reconstructions of the cervical spine were also generated. RADIATION DOSE REDUCTION: This exam was performed according to the departmental dose-optimization program which includes automated exposure control, adjustment of the mA and/or kV according to patient size and/or use of iterative reconstruction technique. COMPARISON:  11/11/2021 FINDINGS: CT HEAD FINDINGS Brain: No evidence of acute infarction, hemorrhage, hydrocephalus, extra-axial collection or mass lesion/mass effect. 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 Chronic  ventriculomegaly and confluent periventricular chronic small vessel ischemia. Vascular: Right vertebral stent Skull: Right suboccipital craniectomy.  No acute finding Sinuses/Orbits: Negative CT CERVICAL SPINE FINDINGS Alignment: Scoliosis.  No traumatic malalignment Skull base and vertebrae: No acute fracture. No primary bone lesion or focal pathologic process. Soft tissues and spinal canal: No prevertebral fluid or swelling. No visible canal hematoma. Indication of prior vocal cord augmentation on the right. Disc levels:  Generalized degenerative endplate and facet spurring. Upper chest: Band of scar-like density at the right apex. IMPRESSION: No evidence of acute intracranial or cervical spine injury. Chronic and postoperative changes as described. Electronically Signed   By: Tiburcio Pea M.D.   On: 12/24/2023 12:33   Scheduled Meds:  atorvastatin  40 mg Oral Daily   enoxaparin (LOVENOX) injection  30 mg Subcutaneous Q24H   ferrous sulfate  325 mg Oral Q breakfast   gabapentin  100 mg Oral BID   mirtazapine  7.5 mg Oral QHS   pantoprazole  40 mg Oral Daily   potassium chloride  40 mEq Oral Once   potassium chloride  40 mEq Oral Once   potassium chloride SA  20 mEq Oral Daily   Continuous Infusions:   LOS: 0 days    Time spent: 50 mins    Willeen Niece, MD [facility]   If 7PM-7AM, please contact night-coverage

## 2023-12-26 ENCOUNTER — Encounter (HOSPITAL_COMMUNITY): Payer: Self-pay | Admitting: Internal Medicine

## 2023-12-26 DIAGNOSIS — W19XXXA Unspecified fall, initial encounter: Secondary | ICD-10-CM | POA: Diagnosis not present

## 2023-12-26 DIAGNOSIS — E785 Hyperlipidemia, unspecified: Secondary | ICD-10-CM | POA: Diagnosis not present

## 2023-12-26 MED ORDER — OXYCODONE HCL 5 MG PO TABS
5.0000 mg | ORAL_TABLET | Freq: Once | ORAL | Status: AC
  Administered 2023-12-26: 5 mg via ORAL
  Filled 2023-12-26: qty 1

## 2023-12-26 NOTE — Progress Notes (Signed)
 30 Day PASRR Note   Patient Details  Name: Elaine Rivera Date of Birth: [birth year]   Transition of Care [facility]) CM/SW Contact:    Amada Jupiter, LCSW Phone Number: 12/26/2023, 3:31 PM  To Whom It May Concern:  Please be advised that this patient will require a short-term nursing home stay - anticipated 30 days or less for rehabilitation and strengthening.   The plan is for return home.

## 2023-12-26 NOTE — Plan of Care (Signed)

## 2023-12-26 NOTE — TOC Initial Note (Signed)
 Transition of Care [facility]) - Initial/Assessment Note    Patient Details  Name: Elaine Rivera MRN: [id number] Date of Birth: [birth year]  Transition of Care [facility]) CM/SW Contact:    Amada Jupiter, LCSW Phone Number: 12/26/2023, 3:36 PM  Clinical Narrative:                  Met with pt today to review TOC/ CSW role with dc planning.  Noted that therapy recommendations have been made for SNF rehab and pt is aware and agreeable with this plan.  Confirms that she does live alone with family checking in on her but agrees with plan for rehab to strengthen prior to return home.  Reviewed SNF bed search process.  Have left VM for daughter as well.  Will begin bed search.  Expected Discharge Plan: Skilled Nursing Facility Barriers to Discharge: Continued Medical Work up, English as a second language teacher   Patient Goals and CMS Choice Patient states their goals for this hospitalization and ongoing recovery are:: return home following SNF rehab          Expected Discharge Plan and Services In-house Referral: Clinical Social Work   Post Acute Care Choice: Skilled Nursing Facility Living arrangements for the past 2 months: Single Family Home                 DME Arranged: N/A DME Agency: NA                  Prior Living Arrangements/Services Living arrangements for the past 2 months: Single Family Home Lives with:: Self Patient language and need for interpreter reviewed:: Yes Do you feel safe going back to the place where you live?: Yes      Need for Family Participation in Patient Care: Yes (Comment) Care giver support system in place?: No (comment)   Criminal Activity/Legal Involvement Pertinent to Current Situation/Hospitalization: No - Comment as needed  Activities of Daily Living   ADL Screening (condition at time of admission) Independently performs ADLs?: No Does the patient have a NEW difficulty with bathing/dressing/toileting/self-feeding that is expected to last >3 days?:  No Does the patient have a NEW difficulty with getting in/out of bed, walking, or climbing stairs that is expected to last >3 days?: No Does the patient have a NEW difficulty with communication that is expected to last >3 days?: No Is the patient deaf or have difficulty hearing?: No Does the patient have difficulty seeing, even when wearing glasses/contacts?: No Does the patient have difficulty concentrating, remembering, or making decisions?: No  Permission Sought/Granted Permission sought to share information with : Family Supports, Oceanographer granted to share information with : Yes, Verbal Permission Granted  Share Information with NAME: daughter, Brigitt Mcclish @ [phone]  Permission granted to share info w AGENCY: SNFs        Emotional Assessment Appearance:: Appears stated age Attitude/Demeanor/Rapport: Gracious, Engaged Affect (typically observed): Accepting Orientation: : Oriented to Self, Oriented to Place, Oriented to  Time, Oriented to Situation Alcohol / Substance Use: Not Applicable Psych Involvement: No (comment)  Admission diagnosis:  Hypokalemia [E87.6] Generalized weakness [R53.1] Fall, initial encounter [W19.XXXA] Accident due to mechanical fall without injury [W19.XXXA] Patient Active Problem List   Diagnosis Date Noted   Accident due to mechanical fall without injury 12/24/2023   Symptomatic anemia 03/16/2023   Palliative care encounter 12/30/2021   FTT (failure to thrive) in adult 11/13/2021   GI bleed 11/13/2021   Acute lower GI bleeding 11/11/2021   Acute blood  loss anemia 11/11/2021   Melena 11/11/2021   Seizure disorder (HCC) 10/27/2021   Protein-calorie malnutrition, severe 08/26/2021   Failure to thrive in adult 08/23/2021   Right 10th rib fracture 08/23/2021   Physical deconditioning 08/23/2021   Bad headache 03/14/2021   Seizure (HCC) 03/06/2021   CVA (cerebral vascular accident) (HCC) 03/06/2021   Ischemic  cerebrovascular accident (CVA) (HCC) 03/05/2021   Hypokalemia 03/05/2021   Anxiety 03/05/2021   Transient speech disturbance 03/05/2021   Lung nodule 03/05/2021   Aphasia    Fall    Anemia 02/28/2020   Shortness of breath 01/24/2020   Dizziness 01/24/2020   Herpes 10/04/2018   Cerebral aneurysm 09/05/2018   NSAID long-term use 04/20/2016   Scoliosis 04/20/2016   Depression 04/20/2016   HTN (hypertension) 04/20/2016   GERD (gastroesophageal reflux disease) 04/20/2016   Occult GI bleeding 04/20/2016   PCP:  Saundra Shelling, NP Pharmacy:   [facility] - [place], [place] - [address] [place] [place] [address] Phone: [phone] Fax: [phone]  [facility] - [place], [place] - [place] [address] [place] [address] Phone: [phone] Fax: [phone]     Social Drivers of Health (SDOH) Social History: SDOH Screenings   Food Insecurity: No Food Insecurity (12/24/2023)  Housing: Unknown (12/24/2023)  Transportation Needs: No Transportation Needs (12/24/2023)  Utilities: Not At Risk (12/24/2023)  Social Connections: Socially Isolated (12/24/2023)  Tobacco Use: Medium Risk (03/17/2023)   SDOH Interventions:     Readmission Risk Interventions    11/13/2021    1:56 PM  Readmission Risk Prevention Plan  Transportation Screening Complete  PCP or Specialist Appt within 5-7 Days Complete  Home Care Screening Complete  Medication Review (RN CM) Complete

## 2023-12-26 NOTE — Plan of Care (Signed)

## 2023-12-26 NOTE — Progress Notes (Signed)
 PROGRESS NOTE    Elaine Rivera  NWG:[id number] DOB: 01/05/1946 DOA: 12/24/2023  PCP: Saundra Shelling, NP   Brief Narrative:  This 78 y.o. female with medical history significant for depression, dementia, prior CVA, GI bleeding, iron deficiency anemia presented in ED s/p fall at home.Daughter reports that her mother is having progressively worsening dementia over the last decade.  The patient lives on her own, She has refused prior attempts to move her to a higher level of care.  Lately, she has been having some persistent diarrhea, apparently fell at home yesterday. She states that she was standing in her bathroom and her legs just gave out, she fell onto her back, denies hitting her head, or losing consciousness.Otherwise, the patient and her daughter deny any recent illness or other acute concerns.  Workup in the ER including imaging as noted below and lab work is relatively benign, though she does have some significant hypokalemia.  Given her advanced age and multiple falls at home, Patient was admitted for further evaluation.  Assessment & Plan:   Principal Problem:   Accident due to mechanical fall without injury  Status post fall at home: Patient lives by self, fell in the morning in the bathroom,  denies any head injury or loss of consciousness. Appears to be mechanical, Patient quite weak and daughter states she is hardly been eating. She also has some electrolyte derangements which may be contributing to her weakness. Replace electrolytes, IV hydration. PT/OT, TOC consult she may benefit from placement.   Hyperlipidemia: Continue Lipitor.   Hypokalemia: Likely due to GI losses and poor p.o. intake, repleted orally. Continue to monitor electrolytes.   Dementia : Continue Remeron. There is no behavioral disturbance.   Iron deficiency anemia: continue iron supplementation   Neuropathy: Continue gabapentin twice daily  DVT prophylaxis: Lovenox Code Status: Full  code Family Communication: No family at bedside Disposition Plan:   Status is: Observation The patient remains OBS appropriate and will d/c before 2 midnights.     Admitted for recurrent falls ,PT/OT recommended SNF.  Consultants:  None  Procedures: None  Antimicrobials:  Anti-infectives (From admission, onward)    None      Subjective: Patient was seen and examined at bedside. Overnight events noted. Patient reports feeling improved,  She is fully oriented and following commands.   She is awaiting SNF placement.  Objective: Vitals:   12/25/23 0429 12/25/23 1337 12/25/23 2008 12/26/23 0542  BP: (!) 161/84 123/64 131/72 (!) 159/97  Pulse: 72 70 66 86  Resp: 14 18 18 18   Temp: 98.7 F (37.1 C) 98.5 F (36.9 C) 98.4 F (36.9 C) 98.4 F (36.9 C)  TempSrc: Oral Oral Oral Oral  SpO2: 95% 96% 96% 97%  Weight:      Height:        Intake/Output Summary (Last 24 hours) at 12/26/2023 1120 Last data filed at 12/26/2023 0500 Gross per 24 hour  Intake 240 ml  Output 900 ml  Net -660 ml   Filed Weights   12/24/23 1117  Weight: 43 kg    Examination:  General exam: Appears comfortable, deconditioned, not in any acute distress. Respiratory system: CTA Bilaterally. Respiratory effort normal.  RR 15 Cardiovascular system: S1 & S2 heard, RRR. No JVD, murmurs, rubs, gallops or clicks. Gastrointestinal system: Abdomen is non distended, soft and non tender.  Normal bowel sounds heard. Central nervous system: Alert and oriented X 2. No focal neurological deficits. Extremities: No edema, no cyanosis, no clubbing Skin:  No rashes, lesions or ulcers Psychiatry: Judgement and insight appear normal. Mood & affect appropriate.     Data Reviewed: I have personally reviewed following labs and imaging studies  CBC: Recent Labs  Lab 12/24/23 1112 12/25/23 0930  WBC 7.0 5.5  NEUTROABS 5.7  --   HGB 13.9 13.3  HCT 42.3 42.6  MCV 87.0 90.3  PLT 272 259   Basic Metabolic  Panel: Recent Labs  Lab 12/24/23 1112 12/25/23 0930 12/25/23 1826  NA 140 140 138  K 2.5* 2.6* 3.7  CL 100 105 104  CO2 28 25 29   GLUCOSE 111* 99 89  BUN 18 10 11   CREATININE 0.58 0.54 0.62  CALCIUM 9.4 9.0 8.8*  MG 2.1  --   --    GFR: Estimated Creatinine Clearance: 40 mL/min (by C-G formula based on SCr of 0.62 mg/dL). Liver Function Tests: Recent Labs  Lab 12/24/23 1112  AST 19  ALT 14  ALKPHOS 88  BILITOT 1.0  PROT 7.2  ALBUMIN 3.7   No results for input(s): "LIPASE", "AMYLASE" in the last 168 hours. No results for input(s): "AMMONIA" in the last 168 hours. Coagulation Profile: No results for input(s): "INR", "PROTIME" in the last 168 hours. Cardiac Enzymes: No results for input(s): "CKTOTAL", "CKMB", "CKMBINDEX", "TROPONINI" in the last 168 hours. BNP (last 3 results) No results for input(s): "PROBNP" in the last 8760 hours. HbA1C: No results for input(s): "HGBA1C" in the last 72 hours. CBG: No results for input(s): "GLUCAP" in the last 168 hours. Lipid Profile: No results for input(s): "CHOL", "HDL", "LDLCALC", "TRIG", "CHOLHDL", "LDLDIRECT" in the last 72 hours. Thyroid Function Tests: No results for input(s): "TSH", "T4TOTAL", "FREET4", "T3FREE", "THYROIDAB" in the last 72 hours. Anemia Panel: No results for input(s): "VITAMINB12", "FOLATE", "FERRITIN", "TIBC", "IRON", "RETICCTPCT" in the last 72 hours. Sepsis Labs: No results for input(s): "PROCALCITON", "LATICACIDVEN" in the last 168 hours.  No results found for this or any previous visit (from the past 240 hours).   Radiology Studies: DG Pelvis Portable Result Date: 12/24/2023 CLINICAL DATA:  Fall. EXAM: PORTABLE PELVIS 1-2 VIEWS COMPARISON:  August 22, 2021. FINDINGS: There is no evidence of pelvic fracture or diastasis. No pelvic bone lesions are seen. IMPRESSION: Negative. Electronically Signed   By: Lupita Raider M.D.   On: 12/24/2023 12:55   DG Chest Portable 1 View Result Date:  12/24/2023 CLINICAL DATA:  Fall. EXAM: PORTABLE CHEST 1 VIEW COMPARISON:  November 11, 2021. FINDINGS: Severe dextroscoliosis of thoracic spine is again noted. Old right rib fracture is noted. Stable cardiomediastinal silhouette. No acute pulmonary disease is noted. IMPRESSION: No active disease. Electronically Signed   By: Lupita Raider M.D.   On: 12/24/2023 12:54   CT THORACIC SPINE WO CONTRAST Result Date: 12/24/2023 CLINICAL DATA:  Back trauma, no prior imaging (Age >= 16y); Back trauma, no prior imaging (Age >= 16y) pain s/p fall, h/o scoliosis. EXAM: CT THORACIC AND LUMBAR SPINE WITHOUT CONTRAST TECHNIQUE: Multidetector CT imaging of the thoracic and lumbar spine was performed without contrast. Multiplanar CT image reconstructions were also generated. RADIATION DOSE REDUCTION: This exam was performed according to the departmental dose-optimization program which includes automated exposure control, adjustment of the mA and/or kV according to patient size and/or use of iterative reconstruction technique. COMPARISON:  Thoracic and lumbar spine radiographs 08/22/2021. Chest CT 03/06/2021. FINDINGS: CT THORACIC SPINE FINDINGS Alignment: Unchanged severe dextroscoliotic curvature of the thoracic spine. Vertebrae: Normal vertebral body heights. No acute vertebral fracture or suspicious bone  lesion. Subacute fracture of the right sixth rib. Paraspinal and other soft tissues: Unchanged 7 mm solid nodule in lateral aspect of the right lower lobe (axial image 76 series 9). Moderate hiatal hernia. Disc levels: Within limitations of severe dextroscoliotic curvature, no high-grade spinal canal stenosis or neural foraminal narrowing. CT LUMBAR SPINE FINDINGS Segmentation: Conventional numbering is assumed with 5 non-rib-bearing, lumbar type vertebral bodies. Diminutive ribs at T12. Alignment: Unchanged severe levoscoliotic curvature of the lumbar spine with left lateral listhesis L4 on L5 and right lateral listhesis of  L1 on L2 and L3 on L4. Vertebrae: Normal vertebral body heights. No acute fracture. Multilevel degenerative endplate changes. Paraspinal and other soft tissues: Bilateral renal calculi, measuring up to 5 mm on the left. Cholelithiasis. Atherosclerotic calcifications of the abdominal aorta and its branches. Disc levels: Multilevel lumbar spondylosis, worst at L1-2, where there is mild spinal canal stenosis and moderate bilateral neural foraminal narrowing. IMPRESSION: 1. No acute fracture or traumatic listhesis of the thoracic or lumbar spine. 2. Subacute fracture of the right sixth rib. 3. Unchanged 7 mm solid nodule in the right lower lobe. 4. Bilateral renal calculi, measuring up to 5 mm on the left. 5. Cholelithiasis. Electronically Signed   By: Orvan Falconer M.D.   On: 12/24/2023 12:43   CT Lumbar Spine Wo Contrast Result Date: 12/24/2023 CLINICAL DATA:  Back trauma, no prior imaging (Age >= 16y); Back trauma, no prior imaging (Age >= 16y) pain s/p fall, h/o scoliosis. EXAM: CT THORACIC AND LUMBAR SPINE WITHOUT CONTRAST TECHNIQUE: Multidetector CT imaging of the thoracic and lumbar spine was performed without contrast. Multiplanar CT image reconstructions were also generated. RADIATION DOSE REDUCTION: This exam was performed according to the departmental dose-optimization program which includes automated exposure control, adjustment of the mA and/or kV according to patient size and/or use of iterative reconstruction technique. COMPARISON:  Thoracic and lumbar spine radiographs 08/22/2021. Chest CT 03/06/2021. FINDINGS: CT THORACIC SPINE FINDINGS Alignment: Unchanged severe dextroscoliotic curvature of the thoracic spine. Vertebrae: Normal vertebral body heights. No acute vertebral fracture or suspicious bone lesion. Subacute fracture of the right sixth rib. Paraspinal and other soft tissues: Unchanged 7 mm solid nodule in lateral aspect of the right lower lobe (axial image 76 series 9). Moderate hiatal  hernia. Disc levels: Within limitations of severe dextroscoliotic curvature, no high-grade spinal canal stenosis or neural foraminal narrowing. CT LUMBAR SPINE FINDINGS Segmentation: Conventional numbering is assumed with 5 non-rib-bearing, lumbar type vertebral bodies. Diminutive ribs at T12. Alignment: Unchanged severe levoscoliotic curvature of the lumbar spine with left lateral listhesis L4 on L5 and right lateral listhesis of L1 on L2 and L3 on L4. Vertebrae: Normal vertebral body heights. No acute fracture. Multilevel degenerative endplate changes. Paraspinal and other soft tissues: Bilateral renal calculi, measuring up to 5 mm on the left. Cholelithiasis. Atherosclerotic calcifications of the abdominal aorta and its branches. Disc levels: Multilevel lumbar spondylosis, worst at L1-2, where there is mild spinal canal stenosis and moderate bilateral neural foraminal narrowing. IMPRESSION: 1. No acute fracture or traumatic listhesis of the thoracic or lumbar spine. 2. Subacute fracture of the right sixth rib. 3. Unchanged 7 mm solid nodule in the right lower lobe. 4. Bilateral renal calculi, measuring up to 5 mm on the left. 5. Cholelithiasis. Electronically Signed   By: Orvan Falconer M.D.   On: 12/24/2023 12:43   CT Head Wo Contrast Result Date: 12/24/2023 CLINICAL DATA:  Neck trauma.  Weakness with fall. EXAM: CT HEAD WITHOUT CONTRAST CT CERVICAL SPINE  WITHOUT CONTRAST TECHNIQUE: Multidetector CT imaging of the head and cervical spine was performed following the standard protocol without intravenous contrast. Multiplanar CT image reconstructions of the cervical spine were also generated. RADIATION DOSE REDUCTION: This exam was performed according to the departmental dose-optimization program which includes automated exposure control, adjustment of the mA and/or kV according to patient size and/or use of iterative reconstruction technique. COMPARISON:  11/11/2021 FINDINGS: CT HEAD FINDINGS Brain: No  evidence of acute infarction, hemorrhage, hydrocephalus, extra-axial collection or mass lesion/mass effect. 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 Chronic ventriculomegaly and confluent periventricular chronic small vessel ischemia. Vascular: Right vertebral stent Skull: Right suboccipital craniectomy.  No acute finding Sinuses/Orbits: Negative CT CERVICAL SPINE FINDINGS Alignment: Scoliosis.  No traumatic malalignment Skull base and vertebrae: No acute fracture. No primary bone lesion or focal pathologic process. Soft tissues and spinal canal: No prevertebral fluid or swelling. No visible canal hematoma. Indication of prior vocal cord augmentation on the right. Disc levels:  Generalized degenerative endplate and facet spurring. Upper chest: Band of scar-like density at the right apex. IMPRESSION: No evidence of acute intracranial or cervical spine injury. Chronic and postoperative changes as described. Electronically Signed   By: Tiburcio Pea M.D.   On: 12/24/2023 12:33   CT Cervical Spine Wo Contrast Result Date: 12/24/2023 CLINICAL DATA:  Neck trauma.  Weakness with fall. EXAM: CT HEAD WITHOUT CONTRAST CT CERVICAL SPINE WITHOUT CONTRAST TECHNIQUE: Multidetector CT imaging of the head and cervical spine was performed following the standard protocol without intravenous contrast. Multiplanar CT image reconstructions of the cervical spine were also generated. RADIATION DOSE REDUCTION: This exam was performed according to the departmental dose-optimization program which includes automated exposure control, adjustment of the mA and/or kV according to patient size and/or use of iterative reconstruction technique. COMPARISON:  11/11/2021 FINDINGS: CT HEAD FINDINGS Brain: No evidence of acute infarction, hemorrhage, hydrocephalus, extra-axial collection or mass lesion/mass effect. 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 Chronic ventriculomegaly and confluent periventricular chronic small vessel ischemia. Vascular: Right vertebral stent Skull: Right suboccipital craniectomy.  No acute finding Sinuses/Orbits: Negative CT CERVICAL SPINE FINDINGS Alignment: Scoliosis.  No traumatic malalignment Skull base and vertebrae: No acute fracture. No primary bone lesion or focal pathologic process. Soft tissues and spinal canal: No prevertebral fluid or swelling. No visible canal hematoma. Indication of prior vocal cord augmentation on the right. Disc levels:  Generalized degenerative endplate and facet spurring. Upper chest: Band of scar-like density at the right apex. IMPRESSION: No evidence of acute intracranial or cervical spine injury. Chronic and postoperative changes as described. Electronically Signed   By: Tiburcio Pea M.D.   On: 12/24/2023 12:33   Scheduled Meds:  atorvastatin  40 mg Oral Daily   Chlorhexidine Gluconate Cloth  6 each Topical Daily   enoxaparin (LOVENOX) injection  30 mg Subcutaneous Q24H   ferrous sulfate  325 mg Oral Q breakfast   gabapentin  100 mg Oral BID   mirtazapine  7.5 mg Oral QHS   pantoprazole  40 mg Oral Daily   potassium chloride SA  20 mEq Oral Daily   Continuous Infusions:   LOS: 0 days    Time spent: 35 mins    Willeen Niece, MD [facility]   If 7PM-7AM, please contact night-coverage

## 2023-12-26 NOTE — NC FL2 (Addendum)
 [place] MEDICAID FL2 LEVEL OF CARE FORM     IDENTIFICATION  Patient Name: Elaine Rivera Birthdate: [birth year] Sex: female Admission Date (Current Location): 12/24/2023  [facility] and [place] Number:  Producer, television/film/video and Address:  [facility],  [address] [place], [place] [address]      Provider Number: [id number]  Attending Physician Name and Address:  Willeen Niece, MD  Relative Name and Phone Number:  daughter, [name] @ [phone]    Current Level of Care: Hospital Recommended Level of Care: Skilled Nursing Facility Prior Approval Number:    Date Approved/Denied:   PASRR Number: [id number]  Discharge Plan: SNF    Current Diagnoses: Patient Active Problem List   Diagnosis Date Noted   Accident due to mechanical fall without injury 12/24/2023   Symptomatic anemia 03/16/2023   Palliative care encounter 12/30/2021   FTT (failure to thrive) in adult 11/13/2021   GI bleed 11/13/2021   Acute lower GI bleeding 11/11/2021   Acute blood loss anemia 11/11/2021   Melena 11/11/2021   Seizure disorder (HCC) 10/27/2021   Protein-calorie malnutrition, severe 08/26/2021   Failure to thrive in adult 08/23/2021   Right 10th rib fracture 08/23/2021   Physical deconditioning 08/23/2021   Bad headache 03/14/2021   Seizure (HCC) 03/06/2021   CVA (cerebral vascular accident) (HCC) 03/06/2021   Ischemic cerebrovascular accident (CVA) (HCC) 03/05/2021   Hypokalemia 03/05/2021   Anxiety 03/05/2021   Transient speech disturbance 03/05/2021   Lung nodule 03/05/2021   Aphasia    Fall    Anemia 02/28/2020   Shortness of breath 01/24/2020   Dizziness 01/24/2020   Herpes 10/04/2018   Cerebral aneurysm 09/05/2018   NSAID long-term use 04/20/2016   Scoliosis 04/20/2016   Depression 04/20/2016   HTN (hypertension) 04/20/2016   GERD (gastroesophageal reflux disease) 04/20/2016   Occult GI bleeding 04/20/2016    Orientation RESPIRATION BLADDER Height  & Weight     Self, Time, Situation, Place  Normal Continent, External catheter (currently with purewick) Weight: 94 lb 12.8 oz (43 kg) Height:  5\' 4"  (162.6 cm)  BEHAVIORAL SYMPTOMS/MOOD NEUROLOGICAL BOWEL NUTRITION STATUS      Incontinent Diet (regular)  AMBULATORY STATUS COMMUNICATION OF NEEDS Skin   Extensive Assist Verbally Normal                       Personal Care Assistance Level of Assistance  Bathing, Dressing Bathing Assistance: Limited assistance   Dressing Assistance: Limited assistance     Functional Limitations Info  Sight, Hearing, Speech Sight Info: Adequate Hearing Info: Adequate Speech Info: Adequate    SPECIAL CARE FACTORS FREQUENCY  PT (By licensed PT), OT (By licensed OT)     PT Frequency: 5x/wk OT Frequency: 5x/wk            Contractures Contractures Info: Not present    Additional Factors Info  Code Status, Allergies, Psychotropic Code Status Info: Full Allergies Info: Sulfa antibiotics; Tramadol Psychotropic Info: see MAR         Current Medications (12/26/2023):  This is the current hospital active medication list Current Facility-Administered Medications  Medication Dose Route Frequency Provider Last Rate Last Admin   acetaminophen (TYLENOL) tablet 650 mg  650 mg Oral Q6H PRN Kirby Crigler, Mir M, MD       Or   acetaminophen (TYLENOL) suppository 650 mg  650 mg Rectal Q6H PRN Kirby Crigler, Mir M, MD       albuterol (PROVENTIL) (2.5 MG/3ML) 0.083% nebulizer  solution 2.5 mg  2.5 mg Nebulization Q2H PRN Kirby Crigler, Mir M, MD       atorvastatin (LIPITOR) tablet 40 mg  40 mg Oral Daily Kirby Crigler, Mir M, MD   40 mg at 12/26/23 4098   Chlorhexidine Gluconate Cloth 2 % PADS 6 each  6 each Topical Daily Willeen Niece, MD   6 each at 12/26/23 0955   enoxaparin (LOVENOX) injection 30 mg  30 mg Subcutaneous Q24H Kirby Crigler, Mir M, MD   30 mg at 12/25/23 2137   ferrous sulfate tablet 325 mg  325 mg Oral Q breakfast Kirby Crigler, Mir M, MD   325 mg  at 12/26/23 1191   gabapentin (NEURONTIN) capsule 100 mg  100 mg Oral BID Kirby Crigler, Mir M, MD   100 mg at 12/26/23 4782   loperamide (IMODIUM) capsule 2 mg  2 mg Oral PRN Kirby Crigler, Mir M, MD       mirtazapine (REMERON) tablet 7.5 mg  7.5 mg Oral QHS Kirby Crigler, Mir M, MD   7.5 mg at 12/25/23 2136   ondansetron (ZOFRAN) tablet 4 mg  4 mg Oral Q6H PRN Kirby Crigler, Mir M, MD       Or   ondansetron [facility]) injection 4 mg  4 mg Intravenous Q6H PRN Kirby Crigler, Mir M, MD       pantoprazole (PROTONIX) EC tablet 40 mg  40 mg Oral Daily Kirby Crigler, Mir M, MD   40 mg at 12/26/23 9562   potassium chloride SA (KLOR-CON M) CR tablet 20 mEq  20 mEq Oral Daily Kirby Crigler, Mir M, MD   20 mEq at 12/26/23 0954   traZODone (DESYREL) tablet 25 mg  25 mg Oral QHS PRN Maryln Gottron, MD         Discharge Medications: Please see discharge summary for a list of discharge medications.  Relevant Imaging Results:  Relevant Lab Results:   Additional Information SS#: [id number]  Kassidy Dockendorf, LCSW

## 2023-12-27 DIAGNOSIS — E785 Hyperlipidemia, unspecified: Secondary | ICD-10-CM | POA: Diagnosis not present

## 2023-12-27 DIAGNOSIS — W19XXXA Unspecified fall, initial encounter: Secondary | ICD-10-CM | POA: Diagnosis not present

## 2023-12-27 MED ORDER — OXYCODONE HCL 5 MG PO TABS
5.0000 mg | ORAL_TABLET | Freq: Once | ORAL | Status: AC
  Administered 2023-12-27: 5 mg via ORAL
  Filled 2023-12-27: qty 1

## 2023-12-27 MED ORDER — SODIUM CHLORIDE 0.9 % IV SOLN
1.0000 g | Freq: Every day | INTRAVENOUS | Status: DC
  Administered 2023-12-27 – 2023-12-28 (×2): 1 g via INTRAVENOUS
  Filled 2023-12-27 (×2): qty 10

## 2023-12-27 NOTE — Plan of Care (Signed)

## 2023-12-27 NOTE — Care Management Obs Status (Signed)
 MEDICARE OBSERVATION STATUS NOTIFICATION   Patient Details  Name: ENGLISH TOMER MRN: [id number] Date of Birth: 1946/02/23   Medicare Observation Status Notification Given:  Yes    Beckie Busing, RN 12/27/2023, 11:41 AM

## 2023-12-27 NOTE — TOC Progression Note (Addendum)
 Transition of Care [facility]) - Progression Note    Patient Details  Name: Elaine Rivera MRN: [id number] Date of Birth: 1946-07-19  [facility]) CM/SW Contact  Beckie Busing, RN Phone [phone]  12/27/2023, 12:24 PM  Clinical Narrative:    CM at bedside to update patient on bed offers. Patient would like placement at [facility]/ Cm spoke with daughter  [place] to conform that family is aware and onboard with the plan. CM spoke with [place] with admissions at [facility] Nelma Rothman is checking to determine if patient has medicare days and CM initiating insurance auth.   [address] Berkley Harvey initiated [place] ID  [id number]  [phone] Insurance Berkley Harvey has been approved. Per Nelma Rothman SNF is working on paperwork and will be able to accept patient on Tuesday 4/1.   1504 Insurance auth approved Plan Berkley Harvey ID [id number] Auth ID [id number] 3/31-4/2  Expected Discharge Plan: Skilled Nursing Facility Barriers to Discharge: Continued Medical Work up, English as a second language teacher  Expected Discharge Plan and Services In-house Referral: Clinical Social Work   Post Acute Care Choice: Skilled Nursing Facility Living arrangements for the past 2 months: Single Family Home                 DME Arranged: N/A DME Agency: NA                   Social Determinants of Health (SDOH) Interventions SDOH Screenings   Food Insecurity: No Food Insecurity (12/24/2023)  Housing: Unknown (12/24/2023)  Transportation Needs: No Transportation Needs (12/24/2023)  Utilities: Not At Risk (12/24/2023)  Social Connections: Socially Isolated (12/24/2023)  Tobacco Use: Medium Risk (12/26/2023)    Readmission Risk Interventions    11/13/2021    1:56 PM  Readmission Risk Prevention Plan  Transportation Screening Complete  PCP or Specialist Appt within 5-7 Days Complete  Home Care Screening Complete  Medication Review (RN CM) Complete

## 2023-12-27 NOTE — Progress Notes (Signed)
 PROGRESS NOTE    Elaine Rivera  MVH:[id number] DOB: [birth year] DOA: 12/24/2023  PCP: Saundra Shelling, NP   Brief Narrative:  This 78 y.o. female with medical history significant for depression, dementia, prior CVA, GI bleeding, iron deficiency anemia presented in ED s/p fall at home.Daughter reports that her mother is having progressively worsening dementia over the last decade.  The patient lives on her own, She has refused prior attempts to move her to a higher level of care.  Lately, she has been having some persistent diarrhea, apparently fell at home yesterday. She states that she was standing in her bathroom and her legs just gave out, she fell onto her back, denies hitting her head, or losing consciousness.Otherwise, the patient and her daughter deny any recent illness or other acute concerns.  Workup in the ER including imaging as noted below and lab work is relatively benign, though she does have some significant hypokalemia.  Given her advanced age and multiple falls at home, Patient was admitted for further evaluation.  Assessment & Plan:   Principal Problem:   Accident due to mechanical fall without injury  Status post fall at home: Patient lives by self, fell in the morning in the bathroom,  denies any head injury or loss of consciousness. Appears to be mechanical, Patient quite weak and daughter states she is hardly been eating. She also has some electrolyte derangements which may be contributing to her weakness. Electrolytes replaced.  Continue IV hydration. PT/OT, TOC consult she may benefit from placement. SNF insurance authorization pending.   Hyperlipidemia: Continue Lipitor.   Hypokalemia: Likely due to GI losses and poor p.o. intake, repleted orally. Continue to monitor electrolytes.   Dementia : Continue Remeron. There is no behavioral disturbance.   Iron deficiency anemia: continue iron supplementation   Neuropathy: Continue gabapentin twice  daily.  Urinary tract infection:  Continue ceftriaxone for 3 days.  DVT prophylaxis: Lovenox Code Status: Full code Family Communication: No family at bedside Disposition Plan:   Status is: Observation The patient remains OBS appropriate and will d/c before 2 midnights.     Admitted for recurrent falls , PT/OT recommended SNF.  Consultants:  None  Procedures: None  Antimicrobials:  Anti-infectives (From admission, onward)    Start     Dose/Rate Route Frequency Ordered Stop   12/27/23 0845  cefTRIAXone (ROCEPHIN) 1 g in sodium chloride 0.9 % 100 mL IVPB        1 g 200 mL/hr over 30 Minutes Intravenous Daily 12/27/23 0835        Subjective: Patient was seen and examined at bedside. Overnight events noted. Patient reports feeling improved, She is fully oriented and following commands.   She is awaiting SNF placement.  Objective: Vitals:   12/26/23 0542 12/26/23 1337 12/26/23 2037 12/27/23 0457  BP: (!) 159/97 119/80 112/88 137/86  Pulse: 86 85 85 84  Resp: 18 14 18 18   Temp: 98.4 F (36.9 C) 98.6 F (37 C) 98.9 F (37.2 C) 98 F (36.7 C)  TempSrc: Oral Oral Oral Oral  SpO2: 97% 97% 95% 97%  Weight:      Height:        Intake/Output Summary (Last 24 hours) at 12/27/2023 1210 Last data filed at 12/27/2023 0500 Gross per 24 hour  Intake 240 ml  Output 825 ml  Net -585 ml   Filed Weights   12/24/23 1117  Weight: 43 kg    Examination:  General exam: Appears comfortable, deconditioned, not in any  acute distress. Respiratory system: CTA Bilaterally. Respiratory effort normal.  RR 14 Cardiovascular system: S1 & S2 heard, RRR. No JVD, murmurs, rubs, gallops or clicks. Gastrointestinal system: Abdomen is non distended, soft and non tender.  Normal bowel sounds heard. Central nervous system: Alert and oriented X 2. No focal neurological deficits. Extremities: No edema, no cyanosis, no clubbing Skin: No rashes, lesions or ulcers Psychiatry: Judgement and  insight appear normal. Mood & affect appropriate.     Data Reviewed: I have personally reviewed following labs and imaging studies  CBC: Recent Labs  Lab 12/24/23 1112 12/25/23 0930  WBC 7.0 5.5  NEUTROABS 5.7  --   HGB 13.9 13.3  HCT 42.3 42.6  MCV 87.0 90.3  PLT 272 259   Basic Metabolic Panel: Recent Labs  Lab 12/24/23 1112 12/25/23 0930 12/25/23 1826  NA 140 140 138  K 2.5* 2.6* 3.7  CL 100 105 104  CO2 28 25 29   GLUCOSE 111* 99 89  BUN 18 10 11   CREATININE 0.58 0.54 0.62  CALCIUM 9.4 9.0 8.8*  MG 2.1  --   --    GFR: Estimated Creatinine Clearance: 40 mL/min (by C-G formula based on SCr of 0.62 mg/dL). Liver Function Tests: Recent Labs  Lab 12/24/23 1112  AST 19  ALT 14  ALKPHOS 88  BILITOT 1.0  PROT 7.2  ALBUMIN 3.7   No results for input(s): "LIPASE", "AMYLASE" in the last 168 hours. No results for input(s): "AMMONIA" in the last 168 hours. Coagulation Profile: No results for input(s): "INR", "PROTIME" in the last 168 hours. Cardiac Enzymes: No results for input(s): "CKTOTAL", "CKMB", "CKMBINDEX", "TROPONINI" in the last 168 hours. BNP (last 3 results) No results for input(s): "PROBNP" in the last 8760 hours. HbA1C: No results for input(s): "HGBA1C" in the last 72 hours. CBG: No results for input(s): "GLUCAP" in the last 168 hours. Lipid Profile: No results for input(s): "CHOL", "HDL", "LDLCALC", "TRIG", "CHOLHDL", "LDLDIRECT" in the last 72 hours. Thyroid Function Tests: No results for input(s): "TSH", "T4TOTAL", "FREET4", "T3FREE", "THYROIDAB" in the last 72 hours. Anemia Panel: No results for input(s): "VITAMINB12", "FOLATE", "FERRITIN", "TIBC", "IRON", "RETICCTPCT" in the last 72 hours. Sepsis Labs: No results for input(s): "PROCALCITON", "LATICACIDVEN" in the last 168 hours.  No results found for this or any previous visit (from the past 240 hours).   Radiology Studies: No results found.  Scheduled Meds:  atorvastatin  40 mg Oral  Daily   Chlorhexidine Gluconate Cloth  6 each Topical Daily   enoxaparin (LOVENOX) injection  30 mg Subcutaneous Q24H   ferrous sulfate  325 mg Oral Q breakfast   gabapentin  100 mg Oral BID   mirtazapine  7.5 mg Oral QHS   pantoprazole  40 mg Oral Daily   potassium chloride SA  20 mEq Oral Daily   Continuous Infusions:  cefTRIAXone (ROCEPHIN)  IV 1 g (12/27/23 0900)     LOS: 0 days    Time spent: 35 mins    Willeen Niece, MD [facility]   If 7PM-7AM, please contact night-coverage

## 2023-12-28 DIAGNOSIS — E876 Hypokalemia: Secondary | ICD-10-CM | POA: Diagnosis not present

## 2023-12-28 DIAGNOSIS — W19XXXA Unspecified fall, initial encounter: Secondary | ICD-10-CM | POA: Diagnosis not present

## 2023-12-28 MED ORDER — CEPHALEXIN 750 MG PO CAPS: 750.0000 mg | ORAL_CAPSULE | Freq: Two times a day (BID) | ORAL | 0 refills | Status: AC

## 2023-12-28 MED ORDER — PANTOPRAZOLE SODIUM 40 MG PO TBEC: 40.0000 mg | DELAYED_RELEASE_TABLET | Freq: Every day | ORAL | 0 refills | Status: AC

## 2023-12-28 MED ORDER — FERROUS SULFATE 325 (65 FE) MG PO TABS: 325.0000 mg | ORAL_TABLET | Freq: Every day | ORAL | 3 refills | Status: DC

## 2023-12-28 NOTE — Plan of Care (Signed)

## 2023-12-28 NOTE — Discharge Summary (Signed)
 Physician Discharge Summary  Elaine Rivera ZOX:[id number] DOB: 11/10/1945 DOA: 12/24/2023  PCP: Saundra Shelling, NP  Admit date: 12/24/2023  Discharge date: 12/28/2023  Admitted From: Home  Disposition:  SNF  Recommendations for Outpatient Follow-up:  Follow up with PCP in 1-2 weeks. Please obtain BMP/CBC in one week. Advised to take Keflex 750 mg twice daily for 3 days for UTI.  Home Health: None Equipment/Devices: None  Discharge Condition: Stable CODE STATUS:Full code Diet recommendation: Heart Healthy   Brief [place] [facility] Course: This 78 y.o. female with medical history significant for depression, dementia, prior CVA, GI bleeding, iron deficiency anemia presented in ED s/p fall at home. Daughter reports that her mother is having progressively worsening dementia over the last decade.  The patient lives on her own, She has refused prior attempts to move her to a higher level of care.  Lately, she has been having some persistent diarrhea, apparently fell at home yesterday. She states that she was standing in her bathroom and her legs just gave out, she fell onto her back, denies hitting her head, or losing consciousness. Otherwise, the patient and her daughter deny any recent illness or other acute concerns.  Workup in the ER including imaging as noted below and lab work is relatively benign, though she does have some significant hypokalemia.  Given her advanced age and multiple falls at home, Patient was admitted for further evaluation. Patient was continued on IV fluids.  Electrolytes were replaced.  She is also found to have a UTI and  started on IV ceftriaxone.  PT and OT recommended skilled nursing facility. Patient feels much better , She wants to be discharged.  Patient being discharged on Keflex for 3 more days for UTI.  Discharge Diagnoses:  Principal Problem:   Accident due to mechanical fall without injury  Status post fall at home: Patient lives by self, fell in  the morning in the bathroom,  denies any head injury or loss of consciousness. Appears to be mechanical, Patient quite weak and daughter states she is hardly been eating. She also has some electrolyte derangements which may be contributing to her weakness. Electrolytes replaced.  Continue IV hydration. PT/OT, TOC consult she may benefit from placement. SNF insurance authorization approved.   Hyperlipidemia: Continue Lipitor.   Hypokalemia: Likely due to GI losses and poor p.o. intake, repleted orally. Continue to monitor electrolytes.   Dementia : Continue Remeron. There is no behavioral disturbance.   Iron deficiency anemia: continue iron supplementation   Neuropathy: Continue gabapentin twice daily.   Urinary tract infection:  Continue ceftriaxone for 3 days. She is being discharged on Keflex for 3 more days.  Discharge Instructions  Discharge Instructions     Call MD for:  difficulty breathing, headache or visual disturbances   Complete by: As directed    Call MD for:  persistant dizziness or light-headedness   Complete by: As directed    Call MD for:  persistant nausea and vomiting   Complete by: As directed    Diet - low sodium heart healthy   Complete by: As directed    Diet Carb Modified   Complete by: As directed    Discharge instructions   Complete by: As directed    Advised to follow-up with primary care physician in 1 week. Advised to take Keflex 750 mg twice daily for 3 days for UTI   Increase activity slowly   Complete by: As directed       Allergies as of  12/28/2023       Reactions   Sulfa Antibiotics Other (See Comments)   Reaction:  Unknown    Tramadol Other (See Comments)   seizures        Medication List     STOP taking these medications    atorvastatin 40 MG tablet Commonly known as: LIPITOR   docusate sodium 100 MG capsule Commonly known as: Colace   ferrous sulfate 325 (65 FE) MG EC tablet Replaced by: ferrous sulfate 325  (65 FE) MG tablet   folic acid 1 MG tablet Commonly known as: FOLVITE   gabapentin 100 MG capsule Commonly known as: NEURONTIN   HYDROcodone-acetaminophen 5-325 MG tablet Commonly known as: NORCO/VICODIN   megestrol 20 MG tablet Commonly known as: MEGACE   meloxicam 15 MG tablet Commonly known as: MOBIC   mirtazapine 7.5 MG tablet Commonly known as: REMERON   omeprazole 40 MG capsule Commonly known as: PRILOSEC Replaced by: pantoprazole 40 MG tablet   pantoprazole 20 MG tablet Commonly known as: PROTONIX   potassium chloride SA 20 MEQ tablet Commonly known as: KLOR-CON M   thiamine 100 MG tablet Commonly known as: VITAMIN B1   Vitamin D (Ergocalciferol) 1.25 MG (50000 UNIT) Caps capsule Commonly known as: DRISDOL       TAKE these medications    acetaminophen 500 MG tablet Commonly known as: TYLENOL Take 500-1,000 mg by mouth every 6 (six) hours as needed for moderate pain or mild pain.   cephALEXin 750 MG capsule Commonly known as: Keflex Take 1 capsule (750 mg total) by mouth in the morning and at bedtime for 3 days.   diphenoxylate-atropine 2.5-0.025 MG tablet Commonly known as: LOMOTIL SMARTSIG:2 Capsule(s) By Mouth 4 Times Daily PRN   escitalopram 10 MG tablet Commonly known as: LEXAPRO Take 10 mg by mouth daily.   ferrous sulfate 325 (65 FE) MG tablet Take 1 tablet (325 mg total) by mouth daily with breakfast. Start taking on: December 29, 2023 Replaces: ferrous sulfate 325 (65 FE) MG EC tablet   pantoprazole 40 MG tablet Commonly known as: PROTONIX Take 1 tablet (40 mg total) by mouth daily. Start taking on: December 29, 2023 Replaces: omeprazole 40 MG capsule   promethazine-dextromethorphan 6.25-15 MG/5ML syrup Commonly known as: PROMETHAZINE-DM Take 5 mLs by mouth every 4 (four) hours as needed.   Salonpas 3.10-03-08 % Ptch Generic drug: Camphor-Menthol-Methyl Sal Apply 1 patch topically 2 (two) times daily as needed (For pain).         Follow-up Information     Micki Riley, Harlin Rain, NP Follow up in 1 week(s).   Specialty: Nurse Practitioner Contact information: [address] [place] [address] [phone]                Allergies  Allergen Reactions   Sulfa Antibiotics Other (See Comments)    Reaction:  Unknown    Tramadol Other (See Comments)    seizures    Consultations: None   Procedures/Studies: DG Pelvis Portable Result Date: 12/24/2023 CLINICAL DATA:  Fall. EXAM: PORTABLE PELVIS 1-2 VIEWS COMPARISON:  August 22, 2021. FINDINGS: There is no evidence of pelvic fracture or diastasis. No pelvic bone lesions are seen. IMPRESSION: Negative. Electronically Signed   By: Lupita Raider M.D.   On: 12/24/2023 12:55   DG Chest Portable 1 View Result Date: 12/24/2023 CLINICAL DATA:  Fall. EXAM: PORTABLE CHEST 1 VIEW COMPARISON:  November 11, 2021. FINDINGS: Severe dextroscoliosis of thoracic spine is again noted. Old right rib fracture is  noted. Stable cardiomediastinal silhouette. No acute pulmonary disease is noted. IMPRESSION: No active disease. Electronically Signed   By: Lupita Raider M.D.   On: 12/24/2023 12:54   CT THORACIC SPINE WO CONTRAST Result Date: 12/24/2023 CLINICAL DATA:  Back trauma, no prior imaging (Age >= 16y); Back trauma, no prior imaging (Age >= 16y) pain s/p fall, h/o scoliosis. EXAM: CT THORACIC AND LUMBAR SPINE WITHOUT CONTRAST TECHNIQUE: Multidetector CT imaging of the thoracic and lumbar spine was performed without contrast. Multiplanar CT image reconstructions were also generated. RADIATION DOSE REDUCTION: This exam was performed according to the departmental dose-optimization program which includes automated exposure control, adjustment of the mA and/or kV according to patient size and/or use of iterative reconstruction technique. COMPARISON:  Thoracic and lumbar spine radiographs 08/22/2021. Chest CT 03/06/2021. FINDINGS: CT THORACIC SPINE FINDINGS Alignment: Unchanged  severe dextroscoliotic curvature of the thoracic spine. Vertebrae: Normal vertebral body heights. No acute vertebral fracture or suspicious bone lesion. Subacute fracture of the right sixth rib. Paraspinal and other soft tissues: Unchanged 7 mm solid nodule in lateral aspect of the right lower lobe (axial image 76 series 9). Moderate hiatal hernia. Disc levels: Within limitations of severe dextroscoliotic curvature, no high-grade spinal canal stenosis or neural foraminal narrowing. CT LUMBAR SPINE FINDINGS Segmentation: Conventional numbering is assumed with 5 non-rib-bearing, lumbar type vertebral bodies. Diminutive ribs at T12. Alignment: Unchanged severe levoscoliotic curvature of the lumbar spine with left lateral listhesis L4 on L5 and right lateral listhesis of L1 on L2 and L3 on L4. Vertebrae: Normal vertebral body heights. No acute fracture. Multilevel degenerative endplate changes. Paraspinal and other soft tissues: Bilateral renal calculi, measuring up to 5 mm on the left. Cholelithiasis. Atherosclerotic calcifications of the abdominal aorta and its branches. Disc levels: Multilevel lumbar spondylosis, worst at L1-2, where there is mild spinal canal stenosis and moderate bilateral neural foraminal narrowing. IMPRESSION: 1. No acute fracture or traumatic listhesis of the thoracic or lumbar spine. 2. Subacute fracture of the right sixth rib. 3. Unchanged 7 mm solid nodule in the right lower lobe. 4. Bilateral renal calculi, measuring up to 5 mm on the left. 5. Cholelithiasis. Electronically Signed   By: Orvan Falconer M.D.   On: 12/24/2023 12:43   CT Lumbar Spine Wo Contrast Result Date: 12/24/2023 CLINICAL DATA:  Back trauma, no prior imaging (Age >= 16y); Back trauma, no prior imaging (Age >= 16y) pain s/p fall, h/o scoliosis. EXAM: CT THORACIC AND LUMBAR SPINE WITHOUT CONTRAST TECHNIQUE: Multidetector CT imaging of the thoracic and lumbar spine was performed without contrast. Multiplanar CT image  reconstructions were also generated. RADIATION DOSE REDUCTION: This exam was performed according to the departmental dose-optimization program which includes automated exposure control, adjustment of the mA and/or kV according to patient size and/or use of iterative reconstruction technique. COMPARISON:  Thoracic and lumbar spine radiographs 08/22/2021. Chest CT 03/06/2021. FINDINGS: CT THORACIC SPINE FINDINGS Alignment: Unchanged severe dextroscoliotic curvature of the thoracic spine. Vertebrae: Normal vertebral body heights. No acute vertebral fracture or suspicious bone lesion. Subacute fracture of the right sixth rib. Paraspinal and other soft tissues: Unchanged 7 mm solid nodule in lateral aspect of the right lower lobe (axial image 76 series 9). Moderate hiatal hernia. Disc levels: Within limitations of severe dextroscoliotic curvature, no high-grade spinal canal stenosis or neural foraminal narrowing. CT LUMBAR SPINE FINDINGS Segmentation: Conventional numbering is assumed with 5 non-rib-bearing, lumbar type vertebral bodies. Diminutive ribs at T12. Alignment: Unchanged severe levoscoliotic curvature of the lumbar spine with left  lateral listhesis L4 on L5 and right lateral listhesis of L1 on L2 and L3 on L4. Vertebrae: Normal vertebral body heights. No acute fracture. Multilevel degenerative endplate changes. Paraspinal and other soft tissues: Bilateral renal calculi, measuring up to 5 mm on the left. Cholelithiasis. Atherosclerotic calcifications of the abdominal aorta and its branches. Disc levels: Multilevel lumbar spondylosis, worst at L1-2, where there is mild spinal canal stenosis and moderate bilateral neural foraminal narrowing. IMPRESSION: 1. No acute fracture or traumatic listhesis of the thoracic or lumbar spine. 2. Subacute fracture of the right sixth rib. 3. Unchanged 7 mm solid nodule in the right lower lobe. 4. Bilateral renal calculi, measuring up to 5 mm on the left. 5. Cholelithiasis.  Electronically Signed   By: Orvan Falconer M.D.   On: 12/24/2023 12:43   CT Head Wo Contrast Result Date: 12/24/2023 CLINICAL DATA:  Neck trauma.  Weakness with fall. EXAM: CT HEAD WITHOUT CONTRAST CT CERVICAL SPINE WITHOUT CONTRAST TECHNIQUE: Multidetector CT imaging of the head and cervical spine was performed following the standard protocol without intravenous contrast. Multiplanar CT image reconstructions of the cervical spine were also generated. RADIATION DOSE REDUCTION: This exam was performed according to the departmental dose-optimization program which includes automated exposure control, adjustment of the mA and/or kV according to patient size and/or use of iterative reconstruction technique. COMPARISON:  11/11/2021 FINDINGS: CT HEAD FINDINGS Brain: No evidence of acute infarction, hemorrhage, hydrocephalus, extra-axial collection or mass lesion/mass effect. 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 Chronic ventriculomegaly and confluent periventricular chronic small vessel ischemia. Vascular: Right vertebral stent Skull: Right suboccipital craniectomy.  No acute finding Sinuses/Orbits: Negative CT CERVICAL SPINE FINDINGS Alignment: Scoliosis.  No traumatic malalignment Skull base and vertebrae: No acute fracture. No primary bone lesion or focal pathologic process. Soft tissues and spinal canal: No prevertebral fluid or swelling. No visible canal hematoma. Indication of prior vocal cord augmentation on the right. Disc levels:  Generalized degenerative endplate and facet spurring. Upper chest: Band of scar-like density at the right apex. IMPRESSION: No evidence of acute intracranial or cervical spine injury. Chronic and postoperative changes as described. Electronically Signed   By: Tiburcio Pea M.D.   On: 12/24/2023 12:33   CT Cervical Spine Wo Contrast Result Date: 12/24/2023 CLINICAL DATA:  Neck trauma.   Weakness with fall. EXAM: CT HEAD WITHOUT CONTRAST CT CERVICAL SPINE WITHOUT CONTRAST TECHNIQUE: Multidetector CT imaging of the head and cervical spine was performed following the standard protocol without intravenous contrast. Multiplanar CT image reconstructions of the cervical spine were also generated. RADIATION DOSE REDUCTION: This exam was performed according to the departmental dose-optimization program which includes automated exposure control, adjustment of the mA and/or kV according to patient size and/or use of iterative reconstruction technique. COMPARISON:  11/11/2021 FINDINGS: CT HEAD FINDINGS Brain: No evidence of acute infarction, hemorrhage, hydrocephalus, extra-axial collection or mass lesion/mass effect. 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 Chronic ventriculomegaly and confluent periventricular chronic small vessel ischemia. Vascular: Right vertebral stent Skull: Right suboccipital craniectomy.  No acute finding Sinuses/Orbits: Negative CT CERVICAL SPINE FINDINGS Alignment: Scoliosis.  No traumatic malalignment Skull base and vertebrae: No acute fracture. No primary bone lesion or focal pathologic process. Soft tissues and spinal canal: No prevertebral fluid or swelling. No visible canal hematoma. Indication of prior vocal cord augmentation on the right. Disc levels:  Generalized degenerative endplate and facet spurring. Upper chest: Band of scar-like density at the right apex. IMPRESSION: No evidence of acute intracranial or cervical spine injury. Chronic and postoperative changes as described. Electronically Signed   By: Tiburcio Pea M.D.   On: 12/24/2023 12:33     Subjective: Patient was seen and examined at bedside.Overnight events noted.   Patient reports doing much better and  wants to be discharged.  Discharge Exam: Vitals:   12/27/23 2041 12/28/23 0549  BP: 110/70 128/79  Pulse: 86  82  Resp: 18 18  Temp: 98.1 F (36.7 C) 98.3 F (36.8 C)  SpO2: 97% 96%   Vitals:   12/27/23 0457 12/27/23 1326 12/27/23 2041 12/28/23 0549  BP: 137/86 (!) 131/90 110/70 128/79  Pulse: 84 79 86 82  Resp: 18 20 18 18   Temp: 98 F (36.7 C) 97.9 F (36.6 C) 98.1 F (36.7 C) 98.3 F (36.8 C)  TempSrc: Oral Oral Oral Oral  SpO2: 97% 95% 97% 96%  Weight:      Height:        General: Pt is alert, awake, not in acute distress Cardiovascular: RRR, S1/S2 +, no rubs, no gallops Respiratory: CTA bilaterally, no wheezing, no rhonchi Abdominal: Soft, NT, ND, bowel sounds + Extremities: no edema, no cyanosis    The results of significant diagnostics from this hospitalization (including imaging, microbiology, ancillary and laboratory) are listed below for reference.     Microbiology: No results found for this or any previous visit (from the past 240 hours).   Labs: BNP (last 3 results) No results for input(s): "BNP" in the last 8760 hours. Basic Metabolic Panel: Recent Labs  Lab 12/24/23 1112 12/25/23 0930 12/25/23 1826  NA 140 140 138  K 2.5* 2.6* 3.7  CL 100 105 104  CO2 28 25 29   GLUCOSE 111* 99 89  BUN 18 10 11   CREATININE 0.58 0.54 0.62  CALCIUM 9.4 9.0 8.8*  MG 2.1  --   --    Liver Function Tests: Recent Labs  Lab 12/24/23 1112  AST 19  ALT 14  ALKPHOS 88  BILITOT 1.0  PROT 7.2  ALBUMIN 3.7   No results for input(s): "LIPASE", "AMYLASE" in the last 168 hours. No results for input(s): "AMMONIA" in the last 168 hours. CBC: Recent Labs  Lab 12/24/23 1112 12/25/23 0930  WBC 7.0 5.5  NEUTROABS 5.7  --   HGB 13.9 13.3  HCT 42.3 42.6  MCV 87.0 90.3  PLT 272 259   Cardiac Enzymes: No results for input(s): "CKTOTAL", "CKMB", "CKMBINDEX", "TROPONINI" in the last 168 hours. BNP: Invalid input(s): "POCBNP" CBG: No results for input(s): "GLUCAP" in the last 168 hours. D-Dimer No results for input(s): "DDIMER" in the last 72 hours. Hgb A1c No results  for input(s): "HGBA1C" in the last 72 hours. Lipid Profile No results for input(s): "CHOL", "HDL", "LDLCALC", "TRIG", "CHOLHDL", "LDLDIRECT" in the last 72 hours. Thyroid function studies No results for input(s): "TSH", "T4TOTAL", "T3FREE", "THYROIDAB" in the last 72 hours.  Invalid input(s): "FREET3" Anemia work up No results for input(s): "VITAMINB12", "FOLATE", "FERRITIN", "TIBC", "IRON", "RETICCTPCT" in the last 72 hours. Urinalysis    Component Value Date/Time   COLORURINE YELLOW 12/24/2023 1510   APPEARANCEUR CLOUDY (A) 12/24/2023 1510   LABSPEC 1.013 12/24/2023 1510   PHURINE 7.0 12/24/2023 1510   GLUCOSEU NEGATIVE 12/24/2023 1510   HGBUR SMALL (A) 12/24/2023 1510   BILIRUBINUR NEGATIVE 12/24/2023 1510   KETONESUR NEGATIVE 12/24/2023 1510  PROTEINUR 30 (A) 12/24/2023 1510   UROBILINOGEN 0.2 12/26/2019 1226   NITRITE POSITIVE (A) 12/24/2023 1510   LEUKOCYTESUR LARGE (A) 12/24/2023 1510   Sepsis Labs Recent Labs  Lab 12/24/23 1112 12/25/23 0930  WBC 7.0 5.5   Microbiology No results found for this or any previous visit (from the past 240 hours).   Time coordinating discharge: Over 30 minutes  SIGNED:   Willeen Niece, MD  [facility] 12/28/2023, 11:02 AM Pager   If 7PM-7AM, please contact night-coverage

## 2023-12-28 NOTE — Discharge Instructions (Signed)
 Advised to follow-up with primary care physician in 1 week. Advised to take Keflex 750 mg twice daily for 3 days for UTI

## 2023-12-28 NOTE — Plan of Care (Signed)
  Problem: Clinical Measurements: Goal: Diagnostic test results will improve Outcome: Progressing Goal: Respiratory complications will improve Outcome: Progressing Goal: Cardiovascular complication will be avoided Outcome: Progressing   Problem: Safety: Goal: Ability to remain free from injury will improve Outcome: Progressing

## 2023-12-28 NOTE — TOC Transition Note (Addendum)
 Transition of Care [facility]) - Discharge Note   Patient Details  Name: Elaine Rivera MRN: [id number] Date of Birth: 20-Feb-1946  [facility] CM/SW Contact:  Beckie Busing, RN Phone Number:[phone] 12/28/2023, 1:18 PM Patient discharging to [facility] for rehab. CM has confirmed with Nelma Rothman at [facility] that facility can accept patient. D/c summary and transfer report have been faxed. Daughter [place] will transport patient to facility. Nurse updated  Nursing please call report to  [facility]  Rm # [phone]  [phone]     Clinical Narrative:       Final next level of care: Skilled Nursing Facility Barriers to Discharge: No Barriers Identified   Patient Goals and CMS Choice Patient states their goals for this hospitalization and ongoing recovery are:: return home following SNF rehab          Discharge Placement                       Discharge Plan and Services Additional resources added to the After Visit Summary for   In-house Referral: Clinical Social Work   Post Acute Care Choice: Skilled Nursing Facility          DME Arranged: N/A DME Agency: NA       HH Arranged: NA HH Agency: NA        Social Drivers of Health (SDOH) Interventions SDOH Screenings   Food Insecurity: No Food Insecurity (12/24/2023)  Housing: Unknown (12/24/2023)  Transportation Needs: No Transportation Needs (12/24/2023)  Utilities: Not At Risk (12/24/2023)  Social Connections: Socially Isolated (12/24/2023)  Tobacco Use: Medium Risk (12/26/2023)     Readmission Risk Interventions    11/13/2021    1:56 PM  Readmission Risk Prevention Plan  Transportation Screening Complete  PCP or Specialist Appt within 5-7 Days Complete  Home Care Screening Complete  Medication Review (RN CM) Complete

## 2024-01-20 ENCOUNTER — Emergency Department (HOSPITAL_COMMUNITY)

## 2024-01-20 ENCOUNTER — Observation Stay (HOSPITAL_COMMUNITY)

## 2024-01-20 ENCOUNTER — Encounter (HOSPITAL_COMMUNITY): Payer: Self-pay

## 2024-01-20 ENCOUNTER — Other Ambulatory Visit: Payer: Self-pay

## 2024-01-20 ENCOUNTER — Inpatient Hospital Stay (HOSPITAL_COMMUNITY)
Admission: EM | Admit: 2024-01-20 | Discharge: 2024-01-26 | DRG: 640 | Disposition: A | Discharge status: Skilled Nursing Facility | Attending: Internal Medicine | Admitting: Internal Medicine

## 2024-01-20 DIAGNOSIS — Z882 Allergy status to sulfonamides status: Secondary | ICD-10-CM

## 2024-01-20 DIAGNOSIS — G9341 Metabolic encephalopathy: Secondary | ICD-10-CM | POA: Diagnosis present

## 2024-01-20 DIAGNOSIS — Z87891 Personal history of nicotine dependence: Secondary | ICD-10-CM

## 2024-01-20 DIAGNOSIS — I10 Essential (primary) hypertension: Secondary | ICD-10-CM | POA: Diagnosis present

## 2024-01-20 DIAGNOSIS — Z751 Person awaiting admission to adequate facility elsewhere: Secondary | ICD-10-CM

## 2024-01-20 DIAGNOSIS — Z885 Allergy status to narcotic agent status: Secondary | ICD-10-CM

## 2024-01-20 DIAGNOSIS — M549 Dorsalgia, unspecified: Secondary | ICD-10-CM

## 2024-01-20 DIAGNOSIS — E538 Deficiency of other specified B group vitamins: Secondary | ICD-10-CM | POA: Diagnosis present

## 2024-01-20 DIAGNOSIS — N390 Urinary tract infection, site not specified: Secondary | ICD-10-CM | POA: Diagnosis present

## 2024-01-20 DIAGNOSIS — R531 Weakness: Principal | ICD-10-CM

## 2024-01-20 DIAGNOSIS — Z90710 Acquired absence of both cervix and uterus: Secondary | ICD-10-CM

## 2024-01-20 DIAGNOSIS — E876 Hypokalemia: Secondary | ICD-10-CM | POA: Diagnosis not present

## 2024-01-20 DIAGNOSIS — Z79899 Other long term (current) drug therapy: Secondary | ICD-10-CM

## 2024-01-20 DIAGNOSIS — R4182 Altered mental status, unspecified: Secondary | ICD-10-CM | POA: Diagnosis present

## 2024-01-20 DIAGNOSIS — Z8673 Personal history of transient ischemic attack (TIA), and cerebral infarction without residual deficits: Secondary | ICD-10-CM

## 2024-01-20 DIAGNOSIS — F32A Depression, unspecified: Secondary | ICD-10-CM | POA: Diagnosis present

## 2024-01-20 DIAGNOSIS — R410 Disorientation, unspecified: Secondary | ICD-10-CM | POA: Diagnosis not present

## 2024-01-20 DIAGNOSIS — Z85840 Personal history of malignant neoplasm of eye: Secondary | ICD-10-CM

## 2024-01-20 DIAGNOSIS — Z85828 Personal history of other malignant neoplasm of skin: Secondary | ICD-10-CM

## 2024-01-20 DIAGNOSIS — B961 Klebsiella pneumoniae [K. pneumoniae] as the cause of diseases classified elsewhere: Secondary | ICD-10-CM | POA: Diagnosis present

## 2024-01-20 DIAGNOSIS — F0393 Unspecified dementia, unspecified severity, with mood disturbance: Secondary | ICD-10-CM | POA: Diagnosis present

## 2024-01-20 DIAGNOSIS — Y92002 Bathroom of unspecified non-institutional (private) residence single-family (private) house as the place of occurrence of the external cause: Secondary | ICD-10-CM

## 2024-01-20 DIAGNOSIS — W1830XA Fall on same level, unspecified, initial encounter: Secondary | ICD-10-CM | POA: Diagnosis present

## 2024-01-20 LAB — I-STAT CHEM 8, ED
BUN: 10 mg/dL (ref 8–23)
Calcium, Ion: 0.91 mmol/L — ABNORMAL LOW (ref 1.15–1.40)
Chloride: 109 mmol/L (ref 98–111)
Creatinine, Ser: 0.3 mg/dL — ABNORMAL LOW (ref 0.44–1.00)
Glucose, Bld: 97 mg/dL (ref 70–99)
HCT: 30 % — ABNORMAL LOW (ref 36.0–46.0)
Hemoglobin: 10.2 g/dL — ABNORMAL LOW (ref 12.0–15.0)
Potassium: 2.1 mmol/L — CL (ref 3.5–5.1)
Sodium: 144 mmol/L (ref 135–145)
TCO2: 23 mmol/L (ref 22–32)

## 2024-01-20 LAB — COMPREHENSIVE METABOLIC PANEL WITH GFR
ALT: 14 U/L (ref 0–44)
AST: 23 U/L (ref 15–41)
Albumin: 3.5 g/dL (ref 3.5–5.0)
Alkaline Phosphatase: 77 U/L (ref 38–126)
Anion gap: 11 (ref 5–15)
BUN: 15 mg/dL (ref 8–23)
CO2: 28 mmol/L (ref 22–32)
Calcium: 9.2 mg/dL (ref 8.9–10.3)
Chloride: 101 mmol/L (ref 98–111)
Creatinine, Ser: 0.58 mg/dL (ref 0.44–1.00)
GFR, Estimated: >60 mL/min (ref >60)
Glucose, Bld: 125 mg/dL — ABNORMAL HIGH (ref 70–99)
Potassium: 3.3 mmol/L — ABNORMAL LOW (ref 3.5–5.1)
Sodium: 140 mmol/L (ref 135–145)
Total Bilirubin: 1.4 mg/dL — ABNORMAL HIGH (ref 0.0–1.2)
Total Protein: 6.6 g/dL (ref 6.5–8.1)

## 2024-01-20 LAB — CBC WITH DIFFERENTIAL/PLATELET
Abs Immature Granulocytes: 0.04 10*3/uL (ref 0.00–0.07)
Basophils Absolute: 0 10*3/uL (ref 0.0–0.1)
Basophils Relative: 1 %
Eosinophils Absolute: 0.1 10*3/uL (ref 0.0–0.5)
Eosinophils Relative: 1 %
HCT: 42 % (ref 36.0–46.0)
Hemoglobin: 13.9 g/dL (ref 12.0–15.0)
Immature Granulocytes: 1 %
Lymphocytes Relative: 12 %
Lymphs Abs: 0.7 10*3/uL (ref 0.7–4.0)
MCH: 29.6 pg (ref 26.0–34.0)
MCHC: 33.1 g/dL (ref 30.0–36.0)
MCV: 89.4 fL (ref 80.0–100.0)
Monocytes Absolute: 0.4 10*3/uL (ref 0.1–1.0)
Monocytes Relative: 8 %
Neutro Abs: 4.5 10*3/uL (ref 1.7–7.7)
Neutrophils Relative %: 77 %
Platelets: 234 10*3/uL (ref 150–400)
RBC: 4.7 MIL/uL (ref 3.87–5.11)
RDW: 13.6 % (ref 11.5–15.5)
WBC: 5.8 10*3/uL (ref 4.0–10.5)
nRBC: 0 % (ref 0.0–0.2)

## 2024-01-20 LAB — URINALYSIS, W/ REFLEX TO CULTURE (INFECTION SUSPECTED)
Bilirubin Urine: NEGATIVE
Glucose, UA: NEGATIVE mg/dL
Ketones, ur: NEGATIVE mg/dL
Nitrite: NEGATIVE
Protein, ur: NEGATIVE mg/dL
Specific Gravity, Urine: 1.005 (ref 1.005–1.030)
pH: 7 (ref 5.0–8.0)

## 2024-01-20 LAB — PROTIME-INR
INR: 1 (ref 0.8–1.2)
Prothrombin Time: 13.5 s (ref 11.4–15.2)

## 2024-01-20 LAB — BLOOD GAS, VENOUS
Acid-Base Excess: 5 mmol/L — ABNORMAL HIGH (ref 0.0–2.0)
Bicarbonate: 29.1 mmol/L — ABNORMAL HIGH (ref 20.0–28.0)
O2 Saturation: 100 %
Patient temperature: 37
pCO2, Ven: 40 mmHg — ABNORMAL LOW (ref 44–60)
pH, Ven: 7.47 — ABNORMAL HIGH (ref 7.25–7.43)
pO2, Ven: 119 mmHg — ABNORMAL HIGH (ref 32–45)

## 2024-01-20 LAB — TYPE AND SCREEN
ABO/RH(D): O POS
Antibody Screen: NEGATIVE

## 2024-01-20 LAB — TROPONIN I (HIGH SENSITIVITY): Troponin I (High Sensitivity): 5 ng/L (ref <18)

## 2024-01-20 LAB — MAGNESIUM: Magnesium: 2.2 mg/dL (ref 1.7–2.4)

## 2024-01-20 LAB — I-STAT CG4 LACTIC ACID, ED: Lactic Acid, Venous: 1.7 mmol/L (ref 0.5–1.9)

## 2024-01-20 LAB — LIPASE, BLOOD: Lipase: 42 U/L (ref 11–51)

## 2024-01-20 LAB — CK: Total CK: 36 U/L — ABNORMAL LOW (ref 38–234)

## 2024-01-20 LAB — ETHANOL: Alcohol, Ethyl (B): <15 mg/dL (ref <15)

## 2024-01-20 LAB — PHOSPHORUS: Phosphorus: 2.6 mg/dL (ref 2.5–4.6)

## 2024-01-20 MED ORDER — ACETAMINOPHEN 325 MG PO TABS
650.0000 mg | ORAL_TABLET | Freq: Four times a day (QID) | ORAL | Status: DC | PRN
  Filled 2024-01-20: qty 2

## 2024-01-20 MED ORDER — SODIUM CHLORIDE 0.9% FLUSH
3.0000 mL | Freq: Two times a day (BID) | INTRAVENOUS | Status: DC
  Administered 2024-01-20 – 2024-01-26 (×12): 3 mL via INTRAVENOUS

## 2024-01-20 MED ORDER — THIAMINE HCL 100 MG/ML IJ SOLN
100.0000 mg | Freq: Every day | INTRAMUSCULAR | Status: DC
  Administered 2024-01-20 – 2024-01-26 (×7): 100 mg via INTRAVENOUS
  Filled 2024-01-20 (×7): qty 2

## 2024-01-20 MED ORDER — POTASSIUM CHLORIDE CRYS ER 20 MEQ PO TBCR
40.0000 meq | EXTENDED_RELEASE_TABLET | Freq: Once | ORAL | Status: AC
  Administered 2024-01-20: 40 meq via ORAL
  Filled 2024-01-20: qty 2

## 2024-01-20 MED ORDER — HYDRALAZINE HCL 20 MG/ML IJ SOLN
10.0000 mg | INTRAMUSCULAR | Status: DC | PRN
  Administered 2024-01-20: 10 mg via INTRAVENOUS
  Filled 2024-01-20: qty 1

## 2024-01-20 MED ORDER — SODIUM CHLORIDE 0.9 % IV BOLUS
1000.0000 mL | Freq: Once | INTRAVENOUS | Status: AC
  Administered 2024-01-20: 1000 mL via INTRAVENOUS

## 2024-01-20 MED ORDER — MELATONIN 5 MG PO TABS
5.0000 mg | ORAL_TABLET | Freq: Every evening | ORAL | Status: AC | PRN
  Administered 2024-01-20 – 2024-01-23 (×3): 5 mg via ORAL
  Filled 2024-01-20 (×3): qty 1

## 2024-01-20 MED ORDER — ESCITALOPRAM OXALATE 10 MG PO TABS
10.0000 mg | ORAL_TABLET | Freq: Every day | ORAL | Status: DC
Start: 2024-01-20 — End: 2024-01-26
  Administered 2024-01-20 – 2024-01-26 (×7): 10 mg via ORAL
  Filled 2024-01-20 (×7): qty 1

## 2024-01-20 MED ORDER — POTASSIUM CHLORIDE 10 MEQ/100ML IV SOLN
10.0000 meq | INTRAVENOUS | Status: AC
Start: 2024-01-20 — End: 2024-01-20
  Administered 2024-01-20 (×2): 10 meq via INTRAVENOUS
  Filled 2024-01-20 (×2): qty 100

## 2024-01-20 MED ORDER — POLYETHYLENE GLYCOL 3350 17 G PO PACK: 17.0000 g | PACK | Freq: Every day | ORAL | Status: DC | PRN

## 2024-01-20 MED ORDER — ENOXAPARIN SODIUM 30 MG/0.3ML IJ SOSY
30.0000 mg | PREFILLED_SYRINGE | INTRAMUSCULAR | Status: DC
  Administered 2024-01-21 – 2024-01-26 (×6): 30 mg via SUBCUTANEOUS
  Filled 2024-01-20 (×6): qty 0.3

## 2024-01-20 MED ORDER — ACETAMINOPHEN 650 MG RE SUPP: 650.0000 mg | Freq: Four times a day (QID) | RECTAL | Status: DC | PRN

## 2024-01-20 MED ORDER — FERROUS SULFATE 325 (65 FE) MG PO TABS
325.0000 mg | ORAL_TABLET | Freq: Every day | ORAL | Status: DC
  Administered 2024-01-21 – 2024-01-26 (×5): 325 mg via ORAL
  Filled 2024-01-20 (×6): qty 1

## 2024-01-20 MED ORDER — SODIUM CHLORIDE 0.9 % IV SOLN
1.0000 g | INTRAVENOUS | Status: DC
  Administered 2024-01-20 – 2024-01-22 (×3): 1 g via INTRAVENOUS
  Filled 2024-01-20 (×3): qty 10

## 2024-01-20 MED ORDER — PANTOPRAZOLE SODIUM 40 MG PO TBEC
40.0000 mg | DELAYED_RELEASE_TABLET | Freq: Every day | ORAL | Status: DC
Start: 2024-01-21 — End: 2024-01-26
  Administered 2024-01-21 – 2024-01-26 (×6): 40 mg via ORAL
  Filled 2024-01-20 (×6): qty 1

## 2024-01-20 NOTE — H&P (Addendum)
 History and Physical    Patient: Elaine Rivera ZOX:[id number] DOB: March 10, 1946 DOA: 01/20/2024 DOS: the patient was seen and examined on 01/20/2024 PCP: Alta Jersey, NP  Patient coming from: Home  Chief Complaint:  Chief Complaint  Patient presents with   Fall   Weakness   HPI: Elaine Rivera is a 78 y.o. female with medical history significant of depression, dementia, prior CVA, GI bleeding, iron deficiency anemia .  Patient was discharged from [facility] health system on April 1.  That hospitalization was precipitated by patient having a fall at home.  Patient apparently was released to rehab facility after her last hospitalization and recently returned home.  Per secondary report obtained via ER provider signout, patient generally is oriented and this is corroborated by her H&P done on March 28 by my colleague.  Patient reports that she felt vertiginous last night, had a fall in her bathroom and was then helped to her bedroom by her boyfriend.  Patient subsequently is not able to tell me why she came to the hospital this morning.  But patient does report she had increasing upper back and neck pain compared to her baseline after her fall.  Per secondary report obtained from EMR as well as EMS provider.  Patient daughter found the patient today in bed covered in urine/feces.  Felt to have increased confusion.  Brought to ER.  Patient was vitally stable here in the ER however felt to be recently confused, medical evaluation is sought.   Review of Systems: As mentioned in the history of present illness. All other systems reviewed and are negative. Past Medical History:  Diagnosis Date   Anemia    years ago   Aneurysm (HCC)    right PICA aneurysm and stent   Aphasia    Arthritis    Basal cell carcinoma of eye    biopsy of left eye/ non cancerous   Chronic back pain    Complication of anesthesia    nausea/vomiting   Depression    Failure to thrive in adult    GERD  (gastroesophageal reflux disease)    GI bleed 10/2021   acute GI bleed with gastric erosion   Headache    Herpes    History of hiatal hernia 10/2021   5 cm hiatal hernia   History of kidney stones    Hypertension    no longer on medications   Hypokalemia    Left anterior fascicular block 11/11/2021   noted on EKG   Scoliosis    Seizures (HCC)    Manifesting with aphasia   Severe protein-calorie malnutrition (HCC)    Stroke [facility])    Past Surgical History:  Procedure Laterality Date   BIOPSY  03/17/2023   Procedure: BIOPSY;  Surgeon: Alvis Jourdain, MD;  Location: [facility];  Service: Gastroenterology;;   CEREBRAL ANEURYSM REPAIR     1998   COLONOSCOPY     ECTOPIC PREGNANCY SURGERY     x2    ENTEROSCOPY N/A 03/01/2020   Procedure: ENTEROSCOPY;  Surgeon: Alvis Jourdain, MD;  Location: [facility];  Service: Endoscopy;  Laterality: N/A;   ENTEROSCOPY N/A 11/14/2021   Procedure: ENTEROSCOPY;  Surgeon: Alvis Jourdain, MD;  Location: [facility];  Service: Endoscopy;  Laterality: N/A;   ENTEROSCOPY N/A 03/17/2023   Procedure: ENTEROSCOPY;  Surgeon: Alvis Jourdain, MD;  Location: [facility];  Service: Gastroenterology;  Laterality: N/A;   ESOPHAGOGASTRODUODENOSCOPY N/A 04/22/2016   Procedure: ESOPHAGOGASTRODUODENOSCOPY (EGD);  Surgeon: Tami Falcon, MD;  Location:  [facility];  Service: Endoscopy;  Laterality: N/A;   ESOPHAGOGASTRODUODENOSCOPY (EGD) WITH PROPOFOL  N/A 11/12/2021   Procedure: ESOPHAGOGASTRODUODENOSCOPY (EGD) WITH PROPOFOL ;  Surgeon: Alvis Jourdain, MD;  Location: [facility];  Service: Endoscopy;  Laterality: N/A;   FEMORAL HERNIA REPAIR Right 07/02/2022   Procedure: Laparoscopic right femoral hernia with mesh;  Surgeon: Shela Derby, MD;  Location: [facility];  Service: General;  Laterality: Right;   GIVENS CAPSULE STUDY N/A 11/12/2021   Procedure: GIVENS CAPSULE STUDY;  Surgeon: Alvis Jourdain, MD;  Location: [facility];  Service: Endoscopy;  Laterality: N/A;   IR 3D  INDEPENDENT WKST  09/05/2018   IR ANGIO INTRA EXTRACRAN SEL COM CAROTID INNOMINATE BILAT MOD SED  09/05/2018   IR ANGIO VERTEBRAL SEL VERTEBRAL UNI R MOD SED  09/05/2018   IR ANGIOGRAM FOLLOW UP STUDY  09/05/2018   IR RADIOLOGIST EVAL & MGMT  03/28/2021   IR TRANSCATH/EMBOLIZ  09/05/2018   PARATHYROIDECTOMY     2002   RADIOLOGY WITH ANESTHESIA N/A 09/05/2018   Procedure: EMBOLIZATION;  Surgeon: Radiologist, Medication, MD;  Location: [facility] OR;  Service: Radiology;  Laterality: N/A;   TONSILLECTOMY     1953   VAGINAL HYSTERECTOMY     2001   VOCAL CORD LATERALIZATION, ENDOSCOPIC APPROACH W/ MLB     2007 (@[facility] )   WRIST SURGERY     left side/2008   WRIST SURGERY     right side/ 2002   Social History:  reports that she has quit smoking. Her smoking use included cigarettes. She has never used smokeless tobacco. She reports that she does not drink alcohol  and does not use drugs.  Allergies  Allergen Reactions   Sulfa Antibiotics Other (See Comments)    Reaction:  Unknown    Tramadol  Other (See Comments)    seizures    Family History  Problem Relation Age of Onset   Cancer Mother    Suicidality Father     Prior to Admission medications   Medication Sig Start Date End Date Taking? Authorizing Provider  acetaminophen  (TYLENOL ) 500 MG tablet Take 500-1,000 mg by mouth every 6 (six) hours as needed for moderate pain or mild pain.   Yes [provider]  Camphor-Menthol-Methyl Sal (SALONPAS) 3.10-03-08 % PTCH Apply 1 patch topically 2 (two) times daily as needed (For pain).   Yes [provider]  cephALEXin  (KEFLEX ) 250 MG capsule Take 750 mg by mouth 2 (two) times daily. 12/28/23  Yes [provider]  diphenoxylate-atropine (LOMOTIL) 2.5-0.025 MG tablet Take 1 tablet by mouth 4 (four) times daily as needed for diarrhea or loose stools. 10/20/23  Yes [provider]  escitalopram  (LEXAPRO ) 10 MG tablet Take 10 mg by mouth daily. 12/16/23  Yes [provider]   ferrous sulfate  325 (65 FE) MG tablet Take 1 tablet (325 mg total) by mouth daily with breakfast. 12/29/23 04/27/24 Yes Magdalene School, MD  pantoprazole  (PROTONIX ) 40 MG tablet Take 1 tablet (40 mg total) by mouth daily. 12/29/23 01/28/24 Yes Magdalene School, MD  promethazine -dextromethorphan (PROMETHAZINE -DM) 6.25-15 MG/5ML syrup Take 5 mLs by mouth every 4 (four) hours as needed. 11/01/23  Yes [provider]    Physical Exam: Vitals:   01/20/24 1116 01/20/24 1124 01/20/24 1633  BP: (!) 172/89  (!) 189/77  Pulse: 63 (!) 59 (!) 52  Resp:  18 16  Temp:  98.3 F (36.8 C) 98.5 F (36.9 C)  TempSrc:  Rectal Oral  SpO2: 97% 97% 98%  Weight:   37.7 kg  Height:   4\' 11"  (1.499 m)   General: Patient is alert and awake, curled up to 1 side thin lady.  No acute distress.  However feels cold in the room.  Patient seems to describe events of the day yesterday in fair confidence.  Including the fact that she fell and she was helped back into the bed by her boyfriend etc.  However patient is not able to share events of this morning with regards to why she ended up in the hospital this morning her outcome.  Per report obtained from the ER provider, patient had told that she herself called the ambulance but daughter claimed that daughter called ambulance.  So there is some discordance in events. Patinet is oriented to name, dater of birth and location. Respiratory exam: There seems to be severe scoliosis of the spine.  Bilateral intravesicular, poor excursion on the left side.  With some minimal crackles there Cardiovascular exam S1-S2 normal Abdomen all quadrants soft nontender Extremities warm without edema no focal motor deficit is appreciated. Patient is complaining of focal tenderness over T4-T5 as well as as C6-7. Neck movement are free  Data Reviewed:  Labs on Admission:  Results for orders placed or performed during the hospital encounter of 01/20/24 (from the past 24 hours)  CBC with  Differential     Status: None   Collection Time: 01/20/24 12:00 PM  Result Value Ref Range   WBC 5.8 4.0 - 10.5 K/uL   RBC 4.70 3.87 - 5.11 MIL/uL   Hemoglobin 13.9 12.0 - 15.0 g/dL   HCT 96.0 45.4 - 09.8 %   MCV 89.4 80.0 - 100.0 fL   MCH 29.6 26.0 - 34.0 pg   MCHC 33.1 30.0 - 36.0 g/dL   RDW 11.9 14.7 - 82.9 %   Platelets 234 150 - 400 K/uL   nRBC 0.0 0.0 - 0.2 %   Neutrophils Relative % 77 %   Neutro Abs 4.5 1.7 - 7.7 K/uL   Lymphocytes Relative 12 %   Lymphs Abs 0.7 0.7 - 4.0 K/uL   Monocytes Relative 8 %   Monocytes Absolute 0.4 0.1 - 1.0 K/uL   Eosinophils Relative 1 %   Eosinophils Absolute 0.1 0.0 - 0.5 K/uL   Basophils Relative 1 %   Basophils Absolute 0.0 0.0 - 0.1 K/uL   Immature Granulocytes 1 %   Abs Immature Granulocytes 0.04 0.00 - 0.07 K/uL  Troponin I (High Sensitivity)     Status: None   Collection Time: 01/20/24 12:00 PM  Result Value Ref Range   Troponin I (High Sensitivity) 5 <18 ng/L  Comprehensive metabolic panel     Status: Abnormal   Collection Time: 01/20/24 12:00 PM  Result Value Ref Range   Sodium 140 135 - 145 mmol/L   Potassium 3.3 (L) 3.5 - 5.1 mmol/L   Chloride 101 98 - 111 mmol/L   CO2 28 22 - 32 mmol/L   Glucose, Bld 125 (H) 70 - 99 mg/dL   BUN 15 8 - 23 mg/dL   Creatinine, Ser 5.62 0.44 - 1.00 mg/dL   Calcium  9.2 8.9 - 10.3 mg/dL   Total Protein 6.6 6.5 - 8.1 g/dL   Albumin 3.5 3.5 - 5.0 g/dL   AST 23 15 - 41 U/L   ALT 14 0 - 44 U/L   Alkaline Phosphatase 77 38 - 126 U/L   Total Bilirubin 1.4 (H) 0.0 - 1.2 mg/dL   GFR, Estimated >13 >08 mL/min   Anion gap  11 5 - 15  Ethanol     Status: None   Collection Time: 01/20/24 12:00 PM  Result Value Ref Range   Alcohol , Ethyl (B) <15 <15 mg/dL  Lipase, blood     Status: None   Collection Time: 01/20/24 12:00 PM  Result Value Ref Range   Lipase 42 11 - 51 U/L  CK     Status: Abnormal   Collection Time: 01/20/24 12:00 PM  Result Value Ref Range   Total CK 36 (L) 38 - 234 U/L   Magnesium      Status: None   Collection Time: 01/20/24 12:00 PM  Result Value Ref Range   Magnesium  2.2 1.7 - 2.4 mg/dL  Phosphorus     Status: None   Collection Time: 01/20/24 12:00 PM  Result Value Ref Range   Phosphorus 2.6 2.5 - 4.6 mg/dL  Type and screen [facility]     Status: None   Collection Time: 01/20/24 12:15 PM  Result Value Ref Range   ABO/RH(D) O POS    Antibody Screen NEG    Sample Expiration      01/23/2024,2359 Performed at [facility], [address] [place], [place] [address]   Blood gas, venous     Status: Abnormal   Collection Time: 01/20/24 12:15 PM  Result Value Ref Range   pH, Ven 7.47 (H) 7.25 - 7.43   pCO2, Ven 40 (L) 44 - 60 mmHg   pO2, Ven 119 (H) 32 - 45 mmHg   Bicarbonate 29.1 (H) 20.0 - 28.0 mmol/L   Acid-Base Excess 5.0 (H) 0.0 - 2.0 mmol/L   O2 Saturation 100 %   Patient temperature 37.0   I-stat chem 8, ED     Status: Abnormal   Collection Time: 01/20/24 12:30 PM  Result Value Ref Range   Sodium 144 135 - 145 mmol/L   Potassium 2.1 (LL) 3.5 - 5.1 mmol/L   Chloride 109 98 - 111 mmol/L   BUN 10 8 - 23 mg/dL   Creatinine, Ser 6.04 (L) 0.44 - 1.00 mg/dL   Glucose, Bld 97 70 - 99 mg/dL   Calcium , Ion 0.91 (L) 1.15 - 1.40 mmol/L   TCO2 23 22 - 32 mmol/L   Hemoglobin 10.2 (L) 12.0 - 15.0 g/dL   HCT 54.0 (L) 98.1 - 19.1 %  I-Stat Lactic Acid     Status: None   Collection Time: 01/20/24 12:30 PM  Result Value Ref Range   Lactic Acid, Venous 1.7 0.5 - 1.9 mmol/L  Urinalysis, w/ Reflex to Culture (Infection Suspected) -Urine, Clean Catch     Status: Abnormal   Collection Time: 01/20/24  2:50 PM  Result Value Ref Range   Specimen Source URINE, CLEAN CATCH    Color, Urine STRAW (A) YELLOW   APPearance HAZY (A) CLEAR   Specific Gravity, Urine 1.005 1.005 - 1.030   pH 7.0 5.0 - 8.0   Glucose, UA NEGATIVE NEGATIVE mg/dL   Hgb urine dipstick SMALL (A) NEGATIVE   Bilirubin Urine NEGATIVE NEGATIVE    Ketones, ur NEGATIVE NEGATIVE mg/dL   Protein, ur NEGATIVE NEGATIVE mg/dL   Nitrite NEGATIVE NEGATIVE   Leukocytes,Ua LARGE (A) NEGATIVE   RBC / HPF 6-10 0 - 5 RBC/hpf   WBC, UA 21-50 0 - 5 WBC/hpf   Bacteria, UA MANY (A) NONE SEEN   Squamous Epithelial / HPF 0-5 0 - 5 /HPF   Mucus PRESENT   Protime-INR     Status: None  Collection Time: 01/20/24  4:53 PM  Result Value Ref Range   Prothrombin Time 13.5 11.4 - 15.2 seconds   INR 1.0 0.8 - 1.2   Basic Metabolic Panel: Recent Labs  Lab 01/20/24 1200 01/20/24 1230  NA 140 144  K 3.3* 2.1*  CL 101 109  CO2 28  --   GLUCOSE 125* 97  BUN 15 10  CREATININE 0.58 0.30*  CALCIUM  9.2  --   MG 2.2  --   PHOS 2.6  --    Liver Function Tests: Recent Labs  Lab 01/20/24 1200  AST 23  ALT 14  ALKPHOS 77  BILITOT 1.4*  PROT 6.6  ALBUMIN 3.5   Recent Labs  Lab 01/20/24 1200  LIPASE 42   No results for input(s): "AMMONIA" in the last 168 hours. CBC: Recent Labs  Lab 01/20/24 1200 01/20/24 1230  WBC 5.8  --   NEUTROABS 4.5  --   HGB 13.9 10.2*  HCT 42.0 30.0*  MCV 89.4  --   PLT 234  --    Cardiac Enzymes: Recent Labs  Lab 01/20/24 1200  CKTOTAL 36*  TROPONINIHS 5    BNP (last 3 results) No results for input(s): "PROBNP" in the last 8760 hours. CBG: No results for input(s): "GLUCAP" in the last 168 hours.  Radiological Exams on Admission:  CT Head Wo Contrast Result Date: 01/20/2024 CLINICAL DATA:  Altered mental status. EXAM: CT HEAD WITHOUT CONTRAST TECHNIQUE: Contiguous axial images were obtained from the base of the skull through the vertex without intravenous contrast. RADIATION DOSE REDUCTION: This exam was performed according to the departmental dose-optimization program which includes automated exposure control, adjustment of the mA and/or kV according to patient size and/or use of iterative reconstruction technique. COMPARISON:  Head CT dated 12/24/2023. FINDINGS: Brain: Moderate age-related atrophy and  chronic microvascular ischemic changes. There is no acute intracranial hemorrhage. Similar appearance of high density, partially calcified mass along the inferior right CP angle. No midline shift. No extra-axial fluid collection. Vascular: Right vertebral artery stent. Skull: No acute calvarial pathology. Right sub occipital craniectomy. Sinuses/Orbits: No acute finding. Other: None IMPRESSION: 1. No acute intracranial pathology. 2. Moderate age-related atrophy and chronic microvascular ischemic changes. 3. Similar appearance of mass along the inferior right CP angle. Electronically Signed   By: Angus Bark M.D.   On: 01/20/2024 14:22   DG Pelvis Portable Result Date: 01/20/2024 CLINICAL DATA:  Weakness EXAM: PORTABLE PELVIS 1-2 VIEWS COMPARISON:  None Available. FINDINGS: There is no evidence of pelvic fracture or diastasis. No pelvic bone lesions are seen. IMPRESSION: Negative. Electronically Signed   By: Fredrich Jefferson M.D.   On: 01/20/2024 14:04   DG Chest Portable 1 View Result Date: 01/20/2024 CLINICAL DATA:  Chest pain EXAM: PORTABLE CHEST 1 VIEW COMPARISON:  December 24, 2023 FINDINGS: The heart size and mediastinal contours are within normal limits. Both lungs are clear. The visualized skeletal structures are unremarkable. Severe dextro rotoscoliosis thoracic spine. IMPRESSION: No active disease. Electronically Signed   By: Fredrich Jefferson M.D.   On: 01/20/2024 14:00    chest X-ray  EKG: Independently reviewed. nsr  No intake/output data recorded. Total I/O In: 1137.7 [IV Piggyback:1137.7] Out: -      Assessment and Plan: * AMS (altered mental status) HPI, apparently patient at baseline is able to recall several days worth of events in reasonable detail.  However on my evaluation here she is not able to tell why she came to the hospital today.  Story obtained from secondary sources is much different than what the story the patient is giving.  Therefore there seems to be some new memory  loss.  Further patient reports having had vertiginous symptoms last night.  Therefore will do a stroke swallow screen and then start regular diet.  In the interim get an MRI brain for the patient.  Will check a B12 TSH RPR and give empiric thiamine .keep on tele till mri brain r/o stroke. Note that patient has been prior diagnossi of dementia.  Head ct - as above. Shows right CP angle mass calcified chronic. I will order Eeg.  Back pain To some extent this is chronic, however patient seems to be complaining of worse pain today.  Given recent fall, I will get x-ray of the back and the cervical spine.  HTN (hypertension) This is new. Not sure as to eitiology just yet. Will treat with PRN hdyralazine for now till mri brain negative.  Depression No suicidal ideation, continue with Lexapro   DVT pxx - lovenox . Check inr ptt    Advance Care Planning:   Code Status: Full Code discussed with daughter over the phone.  Consults: noen at this time.  Family Communication: discussed with daughter.  Severity of Illness: The appropriate patient status for this patient is OBSERVATION. Observation status is judged to be reasonable and necessary in order to provide the required intensity of service to ensure the patient's safety. The patient's presenting symptoms, physical exam findings, and initial radiographic and laboratory data in the context of their medical condition is felt to place them at decreased risk for further clinical deterioration. Furthermore, it is anticipated that the patient will be medically stable for discharge from the hospital within 2 midnights of admission.   Author: Bennie Brave, MD 01/20/2024 6:15 PM  For on call review [email or web address].

## 2024-01-20 NOTE — Progress Notes (Signed)
 Called pt's daugter Alec American [phone]) to give her update about the pt per her request. She verbalized that she wants to talk to the doctor and the social worker tomorrow because she wants her Mom to be discharge in ALF. Pt's daughter already arranged ALF for the pt. Will pass it along the dayshift team. Pt's daughter will be out of the country on Saturday.

## 2024-01-20 NOTE — Assessment & Plan Note (Addendum)
 HPI, apparently patient at baseline is able to recall several days worth of events in reasonable detail.  However on my evaluation here she is not able to tell why she came to the hospital today.  Story obtained from secondary sources is much different than what the story the patient is giving.  Therefore there seems to be some new memory loss.  Further patient reports having had vertiginous symptoms last night.  Therefore will do a stroke swallow screen and then start regular diet.  In the interim get an MRI brain for the patient.  Will check a B12 TSH RPR and give empiric thiamine .keep on tele till mri brain r/o stroke. Note that patient has been prior diagnossi of dementia.  Head ct - as above. Shows right CP angle mass calcified chronic. I will order Eeg.

## 2024-01-20 NOTE — ED Provider Notes (Signed)
 [place] EMERGENCY DEPARTMENT AT [facility] Provider Note   CSN: [id number] Arrival date & time: 01/20/24  1105     History  Chief Complaint  Patient presents with   Fall   Weakness    Elaine Rivera is a 78 y.o. female.  78 year old female with prior medical history as detailed below presents for evaluation.  Patient arrives from home with EMS.  Patient was apparently in her bed for the last 2 days per EMS.  Patient found to be covered with urine and feces.  Patient with history of chronic UTIs.  Patient is unable to provide significant details regarding the events of the last few days.  Patient reports that he "lives with her boyfriend and that he called EMS after she fell this morning".   Additional information provided by the patient's daughter.  Patient was to go to assisted living today at [facility].  The daughter checked on her and found her to be more confused than baseline.  The patient was disheveled and covered in feces and urine.  The daughter is very concerned about possible recurrent UTI.  The history is provided by the patient.       Home Medications Prior to Admission medications   Medication Sig Start Date End Date Taking? Authorizing Provider  acetaminophen  (TYLENOL ) 500 MG tablet Take 500-1,000 mg by mouth every 6 (six) hours as needed for moderate pain or mild pain.   Yes [provider]  Camphor-Menthol-Methyl Sal (SALONPAS) 3.10-03-08 % PTCH Apply 1 patch topically 2 (two) times daily as needed (For pain).   Yes [provider]  cephALEXin  (KEFLEX ) 250 MG capsule Take 750 mg by mouth 2 (two) times daily. 12/28/23  Yes [provider]  diphenoxylate-atropine (LOMOTIL) 2.5-0.025 MG tablet Take 1 tablet by mouth 4 (four) times daily as needed for diarrhea or loose stools. 10/20/23  Yes [provider]  escitalopram  (LEXAPRO ) 10 MG tablet Take 10 mg by mouth daily. 12/16/23  Yes [provider]   ferrous sulfate  325 (65 FE) MG tablet Take 1 tablet (325 mg total) by mouth daily with breakfast. 12/29/23 04/27/24 Yes Magdalene School, MD  pantoprazole  (PROTONIX ) 40 MG tablet Take 1 tablet (40 mg total) by mouth daily. 12/29/23 01/28/24 Yes Magdalene School, MD  promethazine -dextromethorphan (PROMETHAZINE -DM) 6.25-15 MG/5ML syrup Take 5 mLs by mouth every 4 (four) hours as needed. 11/01/23  Yes [provider]      Allergies    Sulfa antibiotics and Tramadol     Review of Systems   Review of Systems  All other systems reviewed and are negative.   Physical Exam Updated Vital Signs BP (!) 172/89 (BP Location: Right Arm)   Pulse (!) 59   Temp 98.3 F (36.8 C) (Rectal)   Resp 18   SpO2 97%  Physical Exam Vitals and nursing note reviewed.  Constitutional:      General: She is not in acute distress.    Appearance: She is well-developed.     Comments: Alert but confused, cannot recall details of the morning.  Disheveled  HENT:     Head: Normocephalic and atraumatic.  Eyes:     Conjunctiva/sclera: Conjunctivae normal.     Pupils: Pupils are equal, round, and reactive to light.  Cardiovascular:     Rate and Rhythm: Normal rate and regular rhythm.     Heart sounds: Normal heart sounds.  Pulmonary:     Effort: Pulmonary effort is normal. No respiratory distress.  Breath sounds: Normal breath sounds.  Abdominal:     General: There is no distension.     Palpations: Abdomen is soft.     Tenderness: There is no abdominal tenderness.  Musculoskeletal:        General: No deformity. Normal range of motion.     Cervical back: Normal range of motion and neck supple.  Skin:    General: Skin is warm and dry.  Neurological:     General: No focal deficit present.     Mental Status: She is alert.     ED Results / Procedures / Treatments   Labs (all labs ordered are listed, but only abnormal results are displayed) Labs Reviewed  I-STAT CHEM 8, ED - Abnormal; Notable for the  following components:      Result Value   Potassium 2.1 (*)    Creatinine, Ser 0.30 (*)    Calcium , Ion 0.91 (*)    Hemoglobin 10.2 (*)    HCT 30.0 (*)    All other components within normal limits  CULTURE, BLOOD (ROUTINE X 2)  CULTURE, BLOOD (ROUTINE X 2)  CBC WITH DIFFERENTIAL/PLATELET  COMPREHENSIVE METABOLIC PANEL WITH GFR  ETHANOL  LIPASE, BLOOD  PROTIME-INR  URINALYSIS, W/ REFLEX TO CULTURE (INFECTION SUSPECTED)  CK  BLOOD GAS, VENOUS  I-STAT CG4 LACTIC ACID, ED  TYPE AND SCREEN  TROPONIN I (HIGH SENSITIVITY)    EKG EKG Interpretation Date/Time:  Thursday January 20 2024 11:26:39 EDT Ventricular Rate:  60 PR Interval:  170 QRS Duration:  81 QT Interval:  441 QTC Calculation: 441 R Axis:   -59  Text Interpretation: Sinus rhythm Left anterior fascicular block Abnormal R-wave progression, late transition Consider left ventricular hypertrophy Nonspecific T abnormalities, lateral leads Confirmed by Angela Kell [phone]) on 01/20/2024 11:57:54 AM  Radiology No results found.  Procedures Procedures    Medications Ordered in ED Medications  potassium chloride  SA (KLOR-CON  M) CR tablet 40 mEq (has no administration in time range)  potassium chloride  10 mEq in 100 mL IVPB (has no administration in time range)  sodium chloride  0.9 % bolus 1,000 mL (1,000 mLs Intravenous New Bag/Given 01/20/24 1214)    ED Course/ Medical Decision Making/ A&P                                 Medical Decision Making Amount and/or Complexity of Data Reviewed Labs: ordered. Radiology: ordered.  Risk Prescription drug management. Decision regarding hospitalization.    Medical Screen Complete  This patient presented to the ED with complaint of weakness.  This complaint involves an extensive number of treatment options. The initial differential diagnosis includes, but is not limited to, deconditioning, metabolic abnormality, infection, etc.  This presentation is: Acute, Chronic,  Self-Limited, Previously Undiagnosed, Uncertain Prognosis, Complicated, Systemic Symptoms, and Threat to Life/Bodily Function  Patient with complex medical history presents with weakness, fatigue, confusion.  Patient would benefit from admission for further workup.  Hospital service is aware of case and will evaluate for admission.  Additional history obtained:  Additional history obtained from EMS External records from outside sources obtained and reviewed including prior ED visits and prior Inpatient records.    Problem List / ED Course:  Weakness, hypokalemia, AMS   Reevaluation:  After the interventions noted above, I reevaluated the patient and found that they have: stayed the same   Disposition:  After consideration of the diagnostic results and the patients response to  treatment, I feel that the patent would benefit from admission.          Final Clinical Impression(s) / ED Diagnoses Final diagnoses:  Weakness  Hypokalemia  Altered mental status, unspecified altered mental status type    Rx / DC Orders ED Discharge Orders          Ordered    Increase activity slowly        01/26/24 1251    Diet general        01/26/24 1251    oxyCODONE  (OXY IR/ROXICODONE ) 5 MG immediate release tablet  Every 6 hours PRN        01/24/24 0821    polyethylene glycol (MIRALAX  / GLYCOLAX ) 17 g packet  Daily PRN        01/24/24 0821    senna-docusate (SENOKOT-S) 8.6-50 MG tablet  At bedtime PRN        01/24/24 0821    feeding supplement (ENSURE ENLIVE / ENSURE PLUS) LIQD  2 times daily between meals        01/24/24 0821              Burnette Carte, MD 02/04/24 [phone]

## 2024-01-20 NOTE — Assessment & Plan Note (Signed)
 This is new. Not sure as to eitiology just yet. Will treat with PRN hdyralazine for now till mri brain negative.

## 2024-01-20 NOTE — Assessment & Plan Note (Signed)
 No suicidal ideation, continue with Lexapro 

## 2024-01-20 NOTE — Assessment & Plan Note (Signed)
 To some extent this is chronic, however patient seems to be complaining of worse pain today.  Given recent fall, I will get x-ray of the back and the cervical spine.

## 2024-01-20 NOTE — ED Triage Notes (Signed)
 BIB EMS from home, bed bound last 2 days covered in urine/feces, intermittent confusion, HX cronic UTIs, some malaise.  BP 138/70 HR 80 rr20  CBG 218  IV 20g LAC NS bolus

## 2024-01-20 NOTE — Progress Notes (Signed)
 Bedside swallow performed. The patient tolerated solids and thin liquids well without s/s of aspiration. Per order, will advance to regular diet. Provider made aware at bedside.

## 2024-01-21 ENCOUNTER — Inpatient Hospital Stay (HOSPITAL_COMMUNITY)
Admit: 2024-01-21 | Discharge: 2024-01-21 | Disposition: A | Discharge status: Home / Self Care | Attending: Internal Medicine | Admitting: Internal Medicine

## 2024-01-21 DIAGNOSIS — F32A Depression, unspecified: Secondary | ICD-10-CM | POA: Diagnosis present

## 2024-01-21 DIAGNOSIS — E876 Hypokalemia: Secondary | ICD-10-CM | POA: Diagnosis present

## 2024-01-21 DIAGNOSIS — Z90710 Acquired absence of both cervix and uterus: Secondary | ICD-10-CM | POA: Diagnosis not present

## 2024-01-21 DIAGNOSIS — Z882 Allergy status to sulfonamides status: Secondary | ICD-10-CM | POA: Diagnosis not present

## 2024-01-21 DIAGNOSIS — W1830XA Fall on same level, unspecified, initial encounter: Secondary | ICD-10-CM | POA: Diagnosis present

## 2024-01-21 DIAGNOSIS — Z885 Allergy status to narcotic agent status: Secondary | ICD-10-CM | POA: Diagnosis not present

## 2024-01-21 DIAGNOSIS — M549 Dorsalgia, unspecified: Secondary | ICD-10-CM | POA: Diagnosis present

## 2024-01-21 DIAGNOSIS — Y92002 Bathroom of unspecified non-institutional (private) residence single-family (private) house as the place of occurrence of the external cause: Secondary | ICD-10-CM | POA: Diagnosis not present

## 2024-01-21 DIAGNOSIS — Z87891 Personal history of nicotine dependence: Secondary | ICD-10-CM | POA: Diagnosis not present

## 2024-01-21 DIAGNOSIS — Z8673 Personal history of transient ischemic attack (TIA), and cerebral infarction without residual deficits: Secondary | ICD-10-CM | POA: Diagnosis not present

## 2024-01-21 DIAGNOSIS — Z751 Person awaiting admission to adequate facility elsewhere: Secondary | ICD-10-CM | POA: Diagnosis not present

## 2024-01-21 DIAGNOSIS — R4182 Altered mental status, unspecified: Secondary | ICD-10-CM | POA: Diagnosis not present

## 2024-01-21 DIAGNOSIS — E538 Deficiency of other specified B group vitamins: Secondary | ICD-10-CM | POA: Diagnosis present

## 2024-01-21 DIAGNOSIS — Z85840 Personal history of malignant neoplasm of eye: Secondary | ICD-10-CM | POA: Diagnosis not present

## 2024-01-21 DIAGNOSIS — Z85828 Personal history of other malignant neoplasm of skin: Secondary | ICD-10-CM | POA: Diagnosis not present

## 2024-01-21 DIAGNOSIS — R400 Somnolence: Secondary | ICD-10-CM | POA: Diagnosis not present

## 2024-01-21 DIAGNOSIS — B961 Klebsiella pneumoniae [K. pneumoniae] as the cause of diseases classified elsewhere: Secondary | ICD-10-CM | POA: Diagnosis present

## 2024-01-21 DIAGNOSIS — I10 Essential (primary) hypertension: Secondary | ICD-10-CM | POA: Diagnosis present

## 2024-01-21 DIAGNOSIS — Z79899 Other long term (current) drug therapy: Secondary | ICD-10-CM | POA: Diagnosis not present

## 2024-01-21 DIAGNOSIS — R410 Disorientation, unspecified: Secondary | ICD-10-CM | POA: Diagnosis not present

## 2024-01-21 DIAGNOSIS — R569 Unspecified convulsions: Secondary | ICD-10-CM

## 2024-01-21 DIAGNOSIS — N390 Urinary tract infection, site not specified: Secondary | ICD-10-CM | POA: Diagnosis present

## 2024-01-21 DIAGNOSIS — F0393 Unspecified dementia, unspecified severity, with mood disturbance: Secondary | ICD-10-CM | POA: Diagnosis present

## 2024-01-21 DIAGNOSIS — G9341 Metabolic encephalopathy: Secondary | ICD-10-CM | POA: Diagnosis present

## 2024-01-21 LAB — CBC
HCT: 38.7 % (ref 36.0–46.0)
Hemoglobin: 12.6 g/dL (ref 12.0–15.0)
MCH: 29.4 pg (ref 26.0–34.0)
MCHC: 32.6 g/dL (ref 30.0–36.0)
MCV: 90.4 fL (ref 80.0–100.0)
Platelets: 219 10*3/uL (ref 150–400)
RBC: 4.28 MIL/uL (ref 3.87–5.11)
RDW: 13.8 % (ref 11.5–15.5)
WBC: 7.3 10*3/uL (ref 4.0–10.5)
nRBC: 0 % (ref 0.0–0.2)

## 2024-01-21 LAB — BASIC METABOLIC PANEL WITH GFR
Anion gap: 10 (ref 5–15)
BUN: 9 mg/dL (ref 8–23)
CO2: 26 mmol/L (ref 22–32)
Calcium: 9.1 mg/dL (ref 8.9–10.3)
Chloride: 99 mmol/L (ref 98–111)
Creatinine, Ser: 0.56 mg/dL (ref 0.44–1.00)
GFR, Estimated: >60 mL/min (ref >60)
Glucose, Bld: 110 mg/dL — ABNORMAL HIGH (ref 70–99)
Potassium: 2.5 mmol/L — CL (ref 3.5–5.1)
Sodium: 135 mmol/L (ref 135–145)

## 2024-01-21 LAB — TSH: TSH: 6.026 u[IU]/mL — ABNORMAL HIGH (ref 0.350–4.500)

## 2024-01-21 LAB — POTASSIUM: Potassium: 4.2 mmol/L (ref 3.5–5.1)

## 2024-01-21 LAB — PROTIME-INR
INR: 1 (ref 0.8–1.2)
Prothrombin Time: 13.6 s (ref 11.4–15.2)

## 2024-01-21 LAB — T4, FREE: Free T4: 1.08 ng/dL (ref 0.61–1.12)

## 2024-01-21 LAB — VITAMIN D 25 HYDROXY (VIT D DEFICIENCY, FRACTURES): Vit D, 25-Hydroxy: 34.49 ng/mL (ref 30–100)

## 2024-01-21 LAB — PHOSPHORUS: Phosphorus: 2.8 mg/dL (ref 2.5–4.6)

## 2024-01-21 LAB — APTT: aPTT: 29 s (ref 24–36)

## 2024-01-21 LAB — AMMONIA: Ammonia: 13 umol/L (ref 9–35)

## 2024-01-21 LAB — RPR: RPR Ser Ql: NONREACTIVE

## 2024-01-21 LAB — FOLATE: Folate: 8.3 ng/mL (ref >5.9)

## 2024-01-21 LAB — VITAMIN B12: Vitamin B-12: 174 pg/mL — ABNORMAL LOW (ref 180–914)

## 2024-01-21 LAB — MAGNESIUM: Magnesium: 1.9 mg/dL (ref 1.7–2.4)

## 2024-01-21 MED ORDER — IPRATROPIUM-ALBUTEROL 0.5-2.5 (3) MG/3ML IN SOLN: 3.0000 mL | RESPIRATORY_TRACT | Status: DC | PRN

## 2024-01-21 MED ORDER — IBUPROFEN 200 MG PO TABS
600.0000 mg | ORAL_TABLET | Freq: Once | ORAL | Status: AC
  Administered 2024-01-21: 600 mg via ORAL
  Filled 2024-01-21: qty 3

## 2024-01-21 MED ORDER — VITAMIN B-12 1000 MCG PO TABS
1000.0000 ug | ORAL_TABLET | Freq: Every day | ORAL | Status: DC
  Administered 2024-01-21 – 2024-01-26 (×6): 1000 ug via ORAL
  Filled 2024-01-21 (×6): qty 1

## 2024-01-21 MED ORDER — GUAIFENESIN 100 MG/5ML PO LIQD: 5.0000 mL | ORAL | Status: DC | PRN

## 2024-01-21 MED ORDER — POTASSIUM CHLORIDE 20 MEQ PO PACK
60.0000 meq | PACK | Freq: Once | ORAL | Status: AC
  Administered 2024-01-21: 60 meq via ORAL
  Filled 2024-01-21: qty 3

## 2024-01-21 MED ORDER — SENNOSIDES-DOCUSATE SODIUM 8.6-50 MG PO TABS: 1.0000 | ORAL_TABLET | Freq: Every evening | ORAL | Status: DC | PRN

## 2024-01-21 MED ORDER — METOPROLOL TARTRATE 5 MG/5ML IV SOLN: 5.0000 mg | INTRAVENOUS | Status: DC | PRN

## 2024-01-21 MED ORDER — POTASSIUM CHLORIDE 10 MEQ/100ML IV SOLN
10.0000 meq | INTRAVENOUS | Status: AC
  Administered 2024-01-21 (×4): 10 meq via INTRAVENOUS
  Filled 2024-01-21 (×4): qty 100

## 2024-01-21 MED ORDER — ENSURE ENLIVE PO LIQD
237.0000 mL | Freq: Two times a day (BID) | ORAL | Status: DC
  Administered 2024-01-21: 237 mL via ORAL

## 2024-01-21 MED ORDER — SODIUM CHLORIDE 0.9 % IV SOLN: INTRAVENOUS | Status: DC

## 2024-01-21 MED ORDER — ONDANSETRON HCL 4 MG/2ML IJ SOLN: 4.0000 mg | Freq: Four times a day (QID) | INTRAMUSCULAR | Status: DC | PRN

## 2024-01-21 NOTE — Progress Notes (Signed)
 Eeg complete, results are pending

## 2024-01-21 NOTE — Plan of Care (Signed)

## 2024-01-21 NOTE — Progress Notes (Signed)
 PROGRESS NOTE    Elaine Rivera  ZOX:[id number] DOB: 11-Nov-1945 DOA: 01/20/2024 PCP: Alta Jersey, NP    Brief Narrative:  78 year old with history of depression, dementia, prior CVA, GI bleeding, iron deficiency anemia discharged from: On April 1 after being admitted for a fall at home.  Eventually was discharged to rehab and recently returned home.  Apparently patient has been falling at home with increasing confusion therefore brought back to the hospital.   Assessment & Plan:  Principal Problem:   AMS (altered mental status) Active Problems:   HTN (hypertension)   Back pain   AMS (altered mental status) Fall -Overall patient has been quite weak with progressive decline at home.  Currently his trauma workup including CT head, MRI brain, cervical spine, chest x-ray, pelvic x-ray are all negative. -CK levels are normal - Folate normal.  Elevated TSH and low B12 -PT/OT  Vitamin B12 deficiency - Supplements started  Hypokalemia/hypocalcemia -Aggressive repletion.  Will check magnesium  and phosphorus  Urinary tract infection -Already on IV empiric Rocephin .  Cultures not sent  Elevated TSH -Check free T4  Vitamin B12 deficiency -Will start supplements   Back pain -Likely chronic from falls   HTN (hypertension)   Depression No suicidal ideation, continue with Lexapro      DVT prophylaxis: Lovenox     Code Status: Full Code Family Communication:  Called [place]; no answer Cont hosp stay for atleast 24 hrs for electrolytes repletion and PT/OT thereafter.     Subjective: Patient mentation is back to baseline this morning.  She is able to answer all the basic questions for me.  Does report of some weakness but denies any nausea, vomiting.   Examination:  General exam: Appears calm and comfortable  Respiratory system: Clear to auscultation. Respiratory effort normal. Cardiovascular system: S1 & S2 heard, RRR. No JVD, murmurs, rubs, gallops or  clicks. No pedal edema. Gastrointestinal system: Abdomen is nondistended, soft and nontender. No organomegaly or masses felt. Normal bowel sounds heard. Central nervous system: Alert and oriented. No focal neurological deficits. Extremities: Symmetric 5 x 5 power. Skin: No rashes, lesions or ulcers Psychiatry: Judgement and insight appear normal. Mood & affect appropriate.                Diet Orders (From admission, onward)     Start     Ordered   01/20/24 1743  Diet regular Fluid consistency: Thin  Diet effective now       Question:  Fluid consistency:  Answer:  Thin   01/20/24 1742            Objective: Vitals:   01/20/24 1633 01/20/24 2014 01/21/24 0029 01/21/24 0400  BP: (!) 189/77 120/67 125/73 123/78  Pulse: (!) 52 79 79 73  Resp: 16 17 18 18   Temp: 98.5 F (36.9 C) 97.7 F (36.5 C) 98.1 F (36.7 C) 97.7 F (36.5 C)  TempSrc: Oral Oral Oral Oral  SpO2: 98% 97% 97% 96%  Weight: 37.7 kg     Height: 4\' 11"  (1.499 m)       Intake/Output Summary (Last 24 hours) at 01/21/2024 1123 Last data filed at 01/20/2024 2244 Gross per 24 hour  Intake 1237.73 ml  Output --  Net 1237.73 ml   Filed Weights   01/20/24 1633  Weight: 37.7 kg    Scheduled Meds:  vitamin B-12  1,000 mcg Oral Daily   enoxaparin  (LOVENOX ) injection  30 mg Subcutaneous Q24H   escitalopram   10 mg Oral Daily  feeding supplement  237 mL Oral BID BM   ferrous sulfate   325 mg Oral Q breakfast   pantoprazole   40 mg Oral Daily   sodium chloride  flush  3 mL Intravenous Q12H   thiamine  (VITAMIN B1) injection  100 mg Intravenous Daily   Continuous Infusions:  sodium chloride  75 mL/hr at 01/21/24 1026   cefTRIAXone  (ROCEPHIN )  IV 1 g (01/20/24 2104)   potassium chloride  10 mEq (01/21/24 1026)    Nutritional status     Body mass index is 16.8 kg/m.  Data Reviewed:   CBC: Recent Labs  Lab 01/20/24 1200 01/20/24 1230 01/21/24 0459  WBC 5.8  --  7.3  NEUTROABS 4.5  --   --    HGB 13.9 10.2* 12.6  HCT 42.0 30.0* 38.7  MCV 89.4  --  90.4  PLT 234  --  219   Basic Metabolic Panel: Recent Labs  Lab 01/20/24 1200 01/20/24 1230 01/21/24 0459 01/21/24 0837  NA 140 144 135  --   K 3.3* 2.1* 2.5*  --   CL 101 109 99  --   CO2 28  --  26  --   GLUCOSE 125* 97 110*  --   BUN 15 10 9   --   CREATININE 0.58 0.30* 0.56  --   CALCIUM  9.2  --  9.1  --   MG 2.2  --   --  1.9  PHOS 2.6  --   --  2.8   GFR: Estimated Creatinine Clearance: 34.5 mL/min (by C-G formula based on SCr of 0.56 mg/dL). Liver Function Tests: Recent Labs  Lab 01/20/24 1200  AST 23  ALT 14  ALKPHOS 77  BILITOT 1.4*  PROT 6.6  ALBUMIN 3.5   Recent Labs  Lab 01/20/24 1200  LIPASE 42   Recent Labs  Lab 01/21/24 0837  AMMONIA 13   Coagulation Profile: Recent Labs  Lab 01/20/24 1653 01/21/24 0459  INR 1.0 1.0   Cardiac Enzymes: Recent Labs  Lab 01/20/24 1200  CKTOTAL 36*   BNP (last 3 results) No results for input(s): "PROBNP" in the last 8760 hours. HbA1C: No results for input(s): "HGBA1C" in the last 72 hours. CBG: No results for input(s): "GLUCAP" in the last 168 hours. Lipid Profile: No results for input(s): "CHOL", "HDL", "LDLCALC", "TRIG", "CHOLHDL", "LDLDIRECT" in the last 72 hours. Thyroid  Function Tests: Recent Labs    01/21/24 0459  TSH 6.026*   Anemia Panel: Recent Labs    01/21/24 0459 01/21/24 0837  VITAMINB12 174*  --   FOLATE  --  8.3   Sepsis Labs: Recent Labs  Lab 01/20/24 1230  LATICACIDVEN 1.7    Recent Results (from the past 240 hours)  Culture, blood (routine x 2)     Status: None (Preliminary result)   Collection Time: 01/20/24 12:00 PM   Specimen: BLOOD  Result Value Ref Range Status   Specimen Description   Final    BLOOD LEFT ANTECUBITAL Performed at [facility], [address] [place], [place] [address]    Special Requests   Final    BOTTLES DRAWN AEROBIC AND ANAEROBIC Blood Culture adequate  volume Performed at [facility], [address], [place], [place] [address]    Culture   Final    NO GROWTH < 24 HOURS Performed at [facility], [address], [place], [place] [address]    Report Status PENDING  Incomplete  Culture, blood (routine x 2)  Status: None (Preliminary result)   Collection Time: 01/20/24  4:53 PM   Specimen: BLOOD LEFT ARM  Result Value Ref Range Status   Specimen Description   Final    BLOOD LEFT ARM Performed at [facility], [address] [place], [place] [address]    Special Requests   Final    BOTTLES DRAWN AEROBIC AND ANAEROBIC Blood Culture results may not be optimal due to an inadequate volume of blood received in culture bottles Performed at [facility], [address], [place], [place] [address]    Culture   Final    NO GROWTH < 24 HOURS Performed at [facility], [address] [place], [place] [address]    Report Status PENDING  Incomplete         Radiology Studies: MR BRAIN WO CONTRAST Result Date: 01/20/2024 CLINICAL DATA:  Stroke suspected EXAM: MRI HEAD WITHOUT CONTRAST TECHNIQUE: Multiplanar, multiecho pulse sequences of the brain and surrounding structures were obtained without intravenous contrast. COMPARISON:  10/26/2021 FINDINGS: Brain: No acute infarct, mass effect or extra-axial collection. Chronic microhemorrhage in the left hemisphere deep white matter. There is multifocal hyperintense T2-weighted signal within the white matter. Generalized volume loss. The midline structures are normal. 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 Skull and upper cervical spine: Normal calvarium and skull base. Visualized upper cervical spine and soft tissues are normal. Sinuses/Orbits:Left maxillary retention cyst.  Normal orbits. IMPRESSION: 1. No acute intracranial abnormality. 2. Large area of magnetic susceptibility effect at  the site of known right PICA aneurysm. 3. Atrophy and chronic microvascular ischemia. Electronically Signed   By: Juanetta Nordmann M.D.   On: 01/20/2024 23:15   CT CERVICAL SPINE WO CONTRAST Result Date: 01/20/2024 CLINICAL DATA:  Fall. EXAM: CT CERVICAL SPINE WITHOUT CONTRAST TECHNIQUE: Multidetector CT imaging of the cervical spine was performed without intravenous contrast. Multiplanar CT image reconstructions were also generated. RADIATION DOSE REDUCTION: This exam was performed according to the departmental dose-optimization program which includes automated exposure control, adjustment of the mA and/or kV according to patient size and/or use of iterative reconstruction technique. COMPARISON:  Cervical spine x-ray same day. Cervical spine CT 12/24/2023 plain skin FINDINGS: Alignment: There is 2 mm of anterolisthesis at C5-C6 similar to prior. Alignment is otherwise anatomic. Skull base and vertebrae: Diffuse osteopenia is present. No acute fracture or focal osseous lesion. Soft tissues and spinal canal: No prevertebral fluid or swelling. No visible canal hematoma. Disc levels: There is disc space narrowing throughout the cervical spine with endplate osteophyte formation most significant at C4-C5, C5-C6 and C6-C7. There is no severe central canal neural foraminal stenosis at any level. Upper chest: Not imaged. Other: None. IMPRESSION: 1. No acute fracture or traumatic subluxation of the cervical spine. 2. Multilevel degenerative changes. Electronically Signed   By: Tyron Gallon M.D.   On: 01/20/2024 23:02   DG Cervical Spine 2-3Vclearing Result Date: 01/20/2024 CLINICAL DATA:  [id number] Pain [id number] EXAM: LIMITED CERVICAL SPINE FOR TRAUMA CLEARING - 2-3 VIEW COMPARISON:  CT C-spine 05/25/2022 FINDINGS: On the lateral view the cervical spine is visualized to the level of C6. Partially visualized extra scoliosis of the midthoracic spine. Mild levocurvature of the upper thoracic spine with mild dextrocurvature of  the mid cervical spine. Normal cervical lordosis. Alignment is otherwise normal. Dens is well positioned between the lateral masses of C1. There is limited evaluation of the dens for acute fracture on the  open-mouth view due to overlying osseous structures. Multilevel moderate degenerative changes of the spine. No acute displaced fracture is detected.No aggressive-appearing focal osseous lesions. Pre-vertebral soft tissues are within normal limits. Atherosclerotic plaque. Vascular clips and vascular stent overlie the neck. IMPRESSION: 1. Negative clearing view of cervical spine. Limited evaluation due to overlapping osseous structures and overlying soft tissues. 2.  Aortic Atherosclerosis (ICD10-I70.0). Electronically Signed   By: Morgane  Naveau M.D.   On: 01/20/2024 19:48   DG Thoracic Spine 2 View Result Date: 01/20/2024 CLINICAL DATA:  Pain after fall EXAM: THORACIC SPINE 2 VIEWS COMPARISON:  CT 12/24/2023 FINDINGS: Marked dextroscoliosis. Vertebral body heights are grossly maintained. Multilevel degenerative osteophytes. Subacute to chronic right sixth rib fracture IMPRESSION: Marked dextroscoliosis with multilevel degenerative change. No definite acute osseous abnormality Electronically Signed   By: Esmeralda Hedge M.D.   On: 01/20/2024 19:47   CT Head Wo Contrast Result Date: 01/20/2024 CLINICAL DATA:  Altered mental status. EXAM: CT HEAD WITHOUT CONTRAST TECHNIQUE: Contiguous axial images were obtained from the base of the skull through the vertex without intravenous contrast. RADIATION DOSE REDUCTION: This exam was performed according to the departmental dose-optimization program which includes automated exposure control, adjustment of the mA and/or kV according to patient size and/or use of iterative reconstruction technique. COMPARISON:  Head CT dated 12/24/2023. FINDINGS: Brain: Moderate age-related atrophy and chronic microvascular ischemic changes. There is no acute intracranial hemorrhage. Similar  appearance of high density, partially calcified mass along the inferior right CP angle. No midline shift. No extra-axial fluid collection. Vascular: Right vertebral artery stent. Skull: No acute calvarial pathology. Right sub occipital craniectomy. Sinuses/Orbits: No acute finding. Other: None IMPRESSION: 1. No acute intracranial pathology. 2. Moderate age-related atrophy and chronic microvascular ischemic changes. 3. Similar appearance of mass along the inferior right CP angle. Electronically Signed   By: Angus Bark M.D.   On: 01/20/2024 14:22   DG Pelvis Portable Result Date: 01/20/2024 CLINICAL DATA:  Weakness EXAM: PORTABLE PELVIS 1-2 VIEWS COMPARISON:  None Available. FINDINGS: There is no evidence of pelvic fracture or diastasis. No pelvic bone lesions are seen. IMPRESSION: Negative. Electronically Signed   By: Fredrich Jefferson M.D.   On: 01/20/2024 14:04   DG Chest Portable 1 View Result Date: 01/20/2024 CLINICAL DATA:  Chest pain EXAM: PORTABLE CHEST 1 VIEW COMPARISON:  December 24, 2023 FINDINGS: The heart size and mediastinal contours are within normal limits. Both lungs are clear. The visualized skeletal structures are unremarkable. Severe dextro rotoscoliosis thoracic spine. IMPRESSION: No active disease. Electronically Signed   By: Fredrich Jefferson M.D.   On: 01/20/2024 14:00           LOS: 0 days   Time spent= 35 mins    Maggie Schooner, MD [facility]  If 7PM-7AM, please contact night-coverage  01/21/2024, 11:23 AM

## 2024-01-21 NOTE — Progress Notes (Addendum)
 Date and time results received: 01/21/24 0626   Test: [place] Critical Value: 2.5  Name of Provider Notified: Bailey Lesser, NP  Orders Received? Or Actions Taken?: Waiting for orders

## 2024-01-21 NOTE — Hospital Course (Addendum)
 Brief Narrative:  78 year old with history of depression, dementia, prior CVA, GI bleeding, iron deficiency anemia discharged from: On April 1 after being admitted for a fall at home.  Eventually was discharged to rehab and recently returned home.  Apparently patient has been falling at home with increasing confusion therefore brought back to the hospital.  During the hospitalization her mentation improved, extensive workup as mentioned below has been negative.  She was treated for for urinary tract infection as well.   Assessment & Plan:  Principal Problem:   AMS (altered mental status) Active Problems:   HTN (hypertension)   Back pain   AMS (altered mental status) Fall -Overall patient has been quite weak with progressive decline at home.  Currently his trauma workup including CT head, MRI brain, cervical spine, chest x-ray, pelvic x-ray are all negative. EEG - neg -CK levels are normal - Folate normal.  Elevated TSH and low B12 -PT/OT = SNF  Vitamin B12 deficiency - Supplements started  Hypokalemia/hypocalcemia - Replete as needed  Urinary tract infection -Already on IV empiric Rocephin .  Urine cultures growing GNR, identification and susceptibility are pending  Elevated TSH - Free T4 is normal.  Repeat thyroid  studies outpatient in about 2-4 weeks  Vitamin B12 deficiency -Will start supplements   Back pain -Likely chronic from falls   HTN (hypertension)   Depression No suicidal ideation, continue with Lexapro    PT/OT-SNF.  TOC consulted  DVT prophylaxis: Lovenox     Code Status: Full Code Family Communication: Daughter updated   Subjective: Seen at bedside, does not have any complaints at this time.  Examination:  General exam: Appears calm and comfortable  Respiratory system: Clear to auscultation. Respiratory effort normal. Cardiovascular system: S1 & S2 heard, RRR. No JVD, murmurs, rubs, gallops or clicks. No pedal edema. Gastrointestinal system: Abdomen  is nondistended, soft and nontender. No organomegaly or masses felt. Normal bowel sounds heard. Central nervous system: Alert and oriented. No focal neurological deficits. Extremities: Symmetric 5 x 5 power. Skin: No rashes, lesions or ulcers Psychiatry: Judgement and insight appear normal. Mood & affect appropriate.

## 2024-01-21 NOTE — Procedures (Signed)
 Patient Name: Elaine Rivera  MRN: [id number]  Epilepsy Attending: Arleene Lack  Referring Physician/Provider: Bennie Brave, MD  Date: 01/21/2024 Duration: 24.11 mins  Patient history: 78yo F with ams. EEG to evaluate for seizure  Level of alertness: Awake  AEDs during EEG study: None  Technical aspects: This EEG study was done with scalp electrodes positioned according to the 10-20 International system of electrode placement. Electrical activity was reviewed with band pass filter of 1-70Hz , sensitivity of 7 uV/mm, display speed of 28mm/sec with a 60Hz  notched filter applied as appropriate. EEG data were recorded continuously and digitally stored.  Video monitoring was available and reviewed as appropriate.  Description: The posterior dominant rhythm consists of 8-9Hz  activity of moderate voltage (25-35 uV) seen predominantly in posterior head regions, symmetric and reactive to eye opening and eye closing. Physiologic photic driving was not seen during photic stimulation.  Hyperventilation was not performed.     IMPRESSION: This study is within normal limits. No seizures or epileptiform discharges were seen throughout the recording.  A normal interictal EEG does not exclude the diagnosis of epilepsy.   Addiel Mccardle O Nashalie Sallis

## 2024-01-21 NOTE — Evaluation (Signed)
 Occupational Therapy Evaluation Patient Details Name: Elaine Rivera MRN: [id number] DOB: May 10, 1946 Today's Date: 01/21/2024   History of Present Illness   Patient is a 78 year old female who presented to ED on 3/28 s/p fall at home.daughter reported that she went over to check on mother and was told that she had a fall.  patient has also been having some persistent diarrhea. PMH: depression, dementia, prior CVA, GI bleeding, iron deficiency anemia, scoliosis.     Clinical Impressions Pt presents with decline in function and safety with ADLs and ADL mobility with impaired strength, balance and endurance. PTA pt lives alone and reports that she was Ind with ADLs/selfcare, uses RW and that her friend Aaron Aas has been assisting has as much as he can with cooking and home mgt. Spoke with pt's daughter [place] by phone and she reports that pt's friend had moved in to help with caregiving but was not "doing a good job". Pt was able to do her ADLs once home from [facility] this month, but shortly after being back at home she stays in bed most of the time, doesn't take her meds, doesn't eat much and with multiple falls prior to admission, uses rollator at baseline. Pt's daughter also reports that arrangement have been made for pt to live at an ALF. Pt would benefit from acute OT services to address impairments to maximize level of function and safety     If plan is discharge home, recommend the following:   A lot of help with bathing/dressing/bathroom;A little help with walking and/or transfers;Assistance with cooking/housework;Assist for transportation;Direct supervision/assist for medications management;Supervision due to cognitive status;Direct supervision/assist for financial management;Help with stairs or ramp for entrance     Functional Status Assessment   Patient has had a recent decline in their functional status and demonstrates the ability to make significant improvements in function in  a reasonable and predictable amount of time.     Equipment Recommendations   Other (comment) (defer)     Recommendations for Other Services   PT consult     Precautions/Restrictions   Precautions Precautions: Fall;Other (comment) Precaution/Restrictions Comments: scoliosis Restrictions Weight Bearing Restrictions Per Provider Order: No     Mobility Bed Mobility Overal bed mobility: Needs Assistance Bed Mobility: Supine to Sit     Supine to sit: Mod assist, Min assist     General bed mobility comments: min A with LE mgt, mod A to elevate trunk and to scoot hips forward at EOB, required encouragement to sit EOB    Transfers Overall transfer level: Needs assistance Equipment used: Rolling walker (2 wheels) Transfers: Sit to/from Stand, Bed to chair/wheelchair/BSC Sit to Stand: Min assist     Step pivot transfers: Min assist     General transfer comment: min verbal cues for correct hand/footplacement      Balance Overall balance assessment: Needs assistance Sitting-balance support: No upper extremity supported, Feet supported Sitting balance-Leahy Scale: Fair     Standing balance support: Bilateral upper extremity supported, During functional activity, Reliant on assistive device for balance Standing balance-Leahy Scale: Poor                             ADL either performed or assessed with clinical judgement   ADL Overall ADL's : Needs assistance/impaired Eating/Feeding: Set up;Sitting   Grooming: Wash/dry hands;Wash/dry face;Contact guard assist;Sitting   Upper Body Bathing: Sitting;Minimal assistance   Lower Body Bathing: Maximal assistance   Upper Body  Dressing : Minimal assistance;Sitting   Lower Body Dressing: Maximal assistance   Toilet Transfer: Minimal assistance;Ambulation;Rolling walker (2 wheels);Cueing for safety;Regular Toilet;Grab bars   Toileting- Clothing Manipulation and Hygiene: Maximal assistance;Sit to/from  stand       Functional mobility during ADLs: Minimal assistance;Rolling walker (2 wheels);Cueing for safety       Vision Baseline Vision/History: 1 Wears glasses Ability to See in Adequate Light: 0 Adequate Patient Visual Report: No change from baseline       Perception         Praxis         Pertinent Vitals/Pain Pain Assessment Pain Assessment: Faces Faces Pain Scale: Hurts little more Pain Location: generalized with mobility Pain Descriptors / Indicators: Discomfort Pain Intervention(s): Monitored during session, Repositioned     Extremity/Trunk Assessment Upper Extremity Assessment Upper Extremity Assessment: Generalized weakness   Lower Extremity Assessment Lower Extremity Assessment: Defer to PT evaluation   Cervical / Trunk Assessment Cervical / Trunk Assessment: Other exceptions;Kyphotic Cervical / Trunk Exceptions: scoliosis   Communication Communication Communication: No apparent difficulties   Cognition Arousal: Alert Behavior During Therapy: WFL for tasks assessed/performed Cognition: History of cognitive impairments             OT - Cognition Comments: initially refusing activity, reporting that she was Ind with ADLs and using a RW, however pt's daughter reports that pt was doing well at time of d/c fro SNF but since then very little for herself shortly after return from SNF recently                         Cueing  General Comments   Cueing Techniques: Verbal cues;Tactile cues;Visual cues      Exercises     Shoulder Instructions      Home Living Family/patient expects to be discharged to:: Skilled nursing facility [facility] has been arranged, but dtr would like SNF rehab before d/c there) Living Arrangements: Spouse/significant other                                      Prior Functioning/Environment Prior Level of Function : Needs assist;History of Falls (last six months)             Mobility  Comments: pt and daughter reports multiple falls prior to admission, uses rollator at baseline ADLs Comments: patients daughter reported pt's friend had moved in to help with caregiving but was not "doing a good job". Pt was able to do her ADLs once home from [facility] this month, but since being back at home she stays in bed most of the time, doesn't take her meds, doesn't eat much    OT Problem List: Decreased strength;Impaired balance (sitting and/or standing);Decreased safety awareness;Decreased activity tolerance;Decreased coordination;Decreased knowledge of use of DME or AE   OT Treatment/Interventions: Self-care/ADL training;DME and/or AE instruction;Therapeutic activities;Balance training;Therapeutic exercise;Patient/family education      OT Goals(Current goals can be found in the care plan section)   Acute Rehab OT Goals Patient Stated Goal: none stated OT Goal Formulation: With patient/family Time For Goal Achievement: 02/04/24 Potential to Achieve Goals: Good ADL Goals Pt Will Perform Grooming: with contact guard assist;with supervision;standing Pt Will Perform Upper Body Bathing: with contact guard assist;with supervision;sitting Pt Will Perform Upper Body Dressing: with contact guard assist;with supervision;sitting Pt Will Transfer to Toilet: with contact guard assist;with supervision;ambulating Pt Will Perform  Toileting - Clothing Manipulation and hygiene: with mod assist;with min assist;sit to/from stand   OT Frequency:  Min 2X/week    Co-evaluation              AM-PAC OT "6 Clicks" Daily Activity     Outcome Measure Help from another person eating meals?: A Little Help from another person taking care of personal grooming?: A Little Help from another person toileting, which includes using toliet, bedpan, or urinal?: A Lot Help from another person bathing (including washing, rinsing, drying)?: A Lot Help from another person to put on and taking off regular upper  body clothing?: A Little Help from another person to put on and taking off regular lower body clothing?: A Lot 6 Click Score: 15   End of Session Equipment Utilized During Treatment: Gait belt;Rolling walker (2 wheels) Nurse Communication: Mobility status  Activity Tolerance: Patient tolerated treatment well Patient left: in chair;with call bell/phone within reach;with chair alarm set  OT Visit Diagnosis: Unsteadiness on feet (R26.81);Other abnormalities of gait and mobility (R26.89);History of falling (Z91.81);Muscle weakness (generalized) (M62.81)                Time: 1610-9604 OT Time Calculation (min): 34 min Charges:  OT General Charges $OT Visit: 1 Visit OT Evaluation $OT Eval Moderate Complexity: 1 Mod OT Treatments $Therapeutic Activity: 8-22 mins    Alfred Ann 01/21/2024, 1:58 PM

## 2024-01-21 NOTE — TOC Initial Note (Signed)
 Transition of Care [facility]) - Initial/Assessment Note    Patient Details  Name: Elaine Rivera MRN: [id number] Date of Birth: 1946/09/18  Transition of [facility]) CM/SW Contact:    Marty Sleet, LCSW Phone Number: 01/21/2024, 2:14 PM  Clinical Narrative:                 Pt from home with s/o. Pt planned to move to [facility] yesterday, however was brought to the hospital due to the state patient was found in. Pt found laying in her own waste in bed. Met with pt and confirmed current plan to discharge to ALF at [facility].  Spoke with pt's daughter who shares she will be leaving tomorrow 4/26 to go to [place] and will be out of the country until next Sunday 5/4. She will still be available vis t/c as needed. Per daughter pt has been to SNF several times and will do well for a short period when she returns home however, quickly declines and stops caring for herself. Pt has lived at [facility] in past however, checked herself out. Pt now agreeable however, pt's daughter has concern that pt will decline going to ALF at discharge. OT evaluated pt and is currently recommending SNF, pt's daughter would like pt to go to SNF if PT also recommending this. CSW will await for PT evaluation and recommendation to determine disposition for ALF vs SNF.  Expected Discharge Plan: Skilled Nursing Facility Barriers to Discharge: Continued Medical Work up   Patient Goals and CMS Choice Patient states their goals for this hospitalization and ongoing recovery are:: To go to [facility]          Expected Discharge Plan and Services In-house Referral: Clinical Social Work Discharge Planning Services: NA Post Acute Care Choice: Nursing Home, Skilled Nursing Facility Living arrangements for the past 2 months: Apartment                 DME Arranged: N/A DME Agency: NA                  Prior Living Arrangements/Services Living arrangements for the past 2 months: Apartment Lives with::  Significant Other Patient language and need for interpreter reviewed:: Yes Do you feel safe going back to the place where you live?: Yes      Need for Family Participation in Patient Care: Yes (Comment) Care giver support system in place?: Yes (comment) Current home services: DME (rollator) Criminal Activity/Legal Involvement Pertinent to Current Situation/Hospitalization: No - Comment as needed  Activities of Daily Living   ADL Screening (condition at time of admission) Independently performs ADLs?: No Does the patient have a NEW difficulty with bathing/dressing/toileting/self-feeding that is expected to last >3 days?: No Does the patient have a NEW difficulty with getting in/out of bed, walking, or climbing stairs that is expected to last >3 days?: No Does the patient have a NEW difficulty with communication that is expected to last >3 days?: No Is the patient deaf or have difficulty hearing?: No Does the patient have difficulty seeing, even when wearing glasses/contacts?: No Does the patient have difficulty concentrating, remembering, or making decisions?: No  Permission Sought/Granted Permission sought to share information with : Facility Medical sales representative, Family Supports Permission granted to share information with : Yes, Verbal Permission Granted  Share Information with NAME: Tenita, Cue (Daughter)  [phone]  Permission granted to share info w AGENCY: [facility]; SNF's        Emotional Assessment Appearance:: Appears stated  age Attitude/Demeanor/Rapport: Engaged Affect (typically observed): Accepting Orientation: : Oriented to Self, Oriented to Place, Oriented to  Time Alcohol  / Substance Use: Not Applicable Psych Involvement: No (comment)  Admission diagnosis:  Hypokalemia [E87.6] Weakness [R53.1] Altered mental status, unspecified altered mental status type [R41.82] AMS (altered mental status) [R41.82] Patient Active Problem List   Diagnosis Date Noted    AMS (altered mental status) 01/20/2024   Back pain 01/20/2024   Accident due to mechanical fall without injury 12/24/2023   Symptomatic anemia 03/16/2023   Palliative care encounter 12/30/2021   FTT (failure to thrive) in adult 11/13/2021   GI bleed 11/13/2021   Acute lower GI bleeding 11/11/2021   Acute blood loss anemia 11/11/2021   Melena 11/11/2021   Seizure disorder (HCC) 10/27/2021   Protein-calorie malnutrition, severe 08/26/2021   Failure to thrive in adult 08/23/2021   Right 10th rib fracture 08/23/2021   Physical deconditioning 08/23/2021   Bad headache 03/14/2021   Seizure (HCC) 03/06/2021   CVA (cerebral vascular accident) (HCC) 03/06/2021   Ischemic cerebrovascular accident (CVA) (HCC) 03/05/2021   Hypokalemia 03/05/2021   Anxiety 03/05/2021   Transient speech disturbance 03/05/2021   Lung nodule 03/05/2021   Aphasia    Fall    Anemia 02/28/2020   Shortness of breath 01/24/2020   Dizziness 01/24/2020   Herpes 10/04/2018   Cerebral aneurysm 09/05/2018   NSAID long-term use 04/20/2016   Scoliosis 04/20/2016   Depression 04/20/2016   HTN (hypertension) 04/20/2016   GERD (gastroesophageal reflux disease) 04/20/2016   Occult GI bleeding 04/20/2016   PCP:  Alta Jersey, NP Pharmacy:   [facility] - [place], [place] - [address] [facility] [address] [place] [address] Phone: [phone] Fax: [phone]  [facility] - [place], [place] - [place] [address] [place] [address] Phone: [phone] Fax: [phone]     Social Drivers of Health (SDOH) Social History: SDOH Screenings   Food Insecurity: No Food Insecurity (01/20/2024)  Housing: Low Risk  (01/20/2024)  Transportation Needs: No Transportation Needs (01/20/2024)  Utilities: Not At Risk (01/20/2024)  Social Connections: Socially Isolated (01/20/2024)  Tobacco Use: Medium Risk (01/20/2024)   SDOH Interventions: Social  Connections Interventions: Inpatient TOC, Intervention Not Indicated   Readmission Risk Interventions    11/13/2021    1:56 PM  Readmission Risk Prevention Plan  Transportation Screening Complete  PCP or Specialist Appt within 5-7 Days Complete  Home Care Screening Complete  Medication Review (RN CM) Complete

## 2024-01-22 DIAGNOSIS — R4182 Altered mental status, unspecified: Secondary | ICD-10-CM | POA: Diagnosis not present

## 2024-01-22 LAB — CBC
HCT: 40.1 % (ref 36.0–46.0)
Hemoglobin: 12.9 g/dL (ref 12.0–15.0)
MCH: 29.9 pg (ref 26.0–34.0)
MCHC: 32.2 g/dL (ref 30.0–36.0)
MCV: 92.8 fL (ref 80.0–100.0)
Platelets: 230 10*3/uL (ref 150–400)
RBC: 4.32 MIL/uL (ref 3.87–5.11)
RDW: 13.7 % (ref 11.5–15.5)
WBC: 5.1 10*3/uL (ref 4.0–10.5)
nRBC: 0 % (ref 0.0–0.2)

## 2024-01-22 LAB — URINE CULTURE: Culture: >=100000 — AB

## 2024-01-22 LAB — PHOSPHORUS: Phosphorus: 2.7 mg/dL (ref 2.5–4.6)

## 2024-01-22 LAB — BASIC METABOLIC PANEL WITH GFR
Anion gap: 10 (ref 5–15)
BUN: 7 mg/dL — ABNORMAL LOW (ref 8–23)
CO2: 24 mmol/L (ref 22–32)
Calcium: 9.2 mg/dL (ref 8.9–10.3)
Chloride: 105 mmol/L (ref 98–111)
Creatinine, Ser: 0.44 mg/dL (ref 0.44–1.00)
GFR, Estimated: >60 mL/min (ref >60)
Glucose, Bld: 87 mg/dL (ref 70–99)
Potassium: 3.5 mmol/L (ref 3.5–5.1)
Sodium: 139 mmol/L (ref 135–145)

## 2024-01-22 LAB — MAGNESIUM: Magnesium: 1.9 mg/dL (ref 1.7–2.4)

## 2024-01-22 MED ORDER — POTASSIUM CHLORIDE CRYS ER 20 MEQ PO TBCR
40.0000 meq | EXTENDED_RELEASE_TABLET | Freq: Once | ORAL | Status: AC
Start: 2024-01-22 — End: 2024-01-22
  Administered 2024-01-22: 40 meq via ORAL
  Filled 2024-01-22: qty 2

## 2024-01-22 MED ORDER — OXYCODONE HCL 5 MG PO TABS
5.0000 mg | ORAL_TABLET | ORAL | Status: DC | PRN
  Administered 2024-01-22 – 2024-01-26 (×7): 5 mg via ORAL
  Filled 2024-01-22 (×7): qty 1

## 2024-01-22 NOTE — Progress Notes (Signed)
 PROGRESS NOTE    Elaine Rivera  ZOX:[id number] DOB: [birth year] DOA: 01/20/2024 PCP: Alta Jersey, NP    Brief Narrative:  78 year old with history of depression, dementia, prior CVA, GI bleeding, iron deficiency anemia discharged from: On April 1 after being admitted for a fall at home.  Eventually was discharged to rehab and recently returned home.  Apparently patient has been falling at home with increasing confusion therefore brought back to the hospital.  During the hospitalization her mentation improved, extensive workup as mentioned below has been negative.  She was treated for for urinary tract infection as well.   Assessment & Plan:  Principal Problem:   AMS (altered mental status) Active Problems:   HTN (hypertension)   Back pain   AMS (altered mental status) Fall -Overall patient has been quite weak with progressive decline at home.  Currently his trauma workup including CT head, MRI brain, cervical spine, chest x-ray, pelvic x-ray are all negative. EEG - neg -CK levels are normal - Folate normal.  Elevated TSH and low B12 -PT/OT = SNF  Vitamin B12 deficiency - Supplements started  Hypokalemia/hypocalcemia - Replete as needed  Urinary tract infection -Already on IV empiric Rocephin .  Urine cultures growing GNR, identification and susceptibility are pending  Elevated TSH - Free T4 is normal.  Repeat thyroid  studies outpatient in about 2-4 weeks  Vitamin B12 deficiency -Will start supplements   Back pain -Likely chronic from falls   HTN (hypertension)   Depression No suicidal ideation, continue with Lexapro    PT/OT-SNF.  TOC consulted  DVT prophylaxis: Lovenox     Code Status: Full Code Family Communication: Daughter updated   Subjective: Seen at bedside, does not have any complaints at this time.  Examination:  General exam: Appears calm and comfortable  Respiratory system: Clear to auscultation. Respiratory effort  normal. Cardiovascular system: S1 & S2 heard, RRR. No JVD, murmurs, rubs, gallops or clicks. No pedal edema. Gastrointestinal system: Abdomen is nondistended, soft and nontender. No organomegaly or masses felt. Normal bowel sounds heard. Central nervous system: Alert and oriented. No focal neurological deficits. Extremities: Symmetric 5 x 5 power. Skin: No rashes, lesions or ulcers Psychiatry: Judgement and insight appear normal. Mood & affect appropriate.                Diet Orders (From admission, onward)     Start     Ordered   01/20/24 1743  Diet regular Fluid consistency: Thin  Diet effective now       Question:  Fluid consistency:  Answer:  Thin   01/20/24 1742            Objective: Vitals:   01/21/24 0400 01/21/24 1238 01/21/24 2016 01/22/24 0348  BP: 123/78 132/68 134/78 (!) 150/80  Pulse: 73 87 70 61  Resp: 18  18 18   Temp: 97.7 F (36.5 C) 98.7 F (37.1 C) 98.2 F (36.8 C) 97.6 F (36.4 C)  TempSrc: Oral Oral Oral   SpO2: 96% 98% 97% 99%  Weight:      Height:        Intake/Output Summary (Last 24 hours) at 01/22/2024 1014 Last data filed at 01/22/2024 0715 Gross per 24 hour  Intake 794.55 ml  Output 1650 ml  Net -855.45 ml   Filed Weights   01/20/24 1633  Weight: 37.7 kg    Scheduled Meds:  vitamin B-12  1,000 mcg Oral Daily   enoxaparin  (LOVENOX ) injection  30 mg Subcutaneous Q24H   escitalopram   10  mg Oral Daily   feeding supplement  237 mL Oral BID BM   ferrous sulfate   325 mg Oral Q breakfast   pantoprazole   40 mg Oral Daily   sodium chloride  flush  3 mL Intravenous Q12H   thiamine  (VITAMIN B1) injection  100 mg Intravenous Daily   Continuous Infusions:  cefTRIAXone  (ROCEPHIN )  IV 1 g (01/21/24 2125)    Nutritional status     Body mass index is 16.8 kg/m.  Data Reviewed:   CBC: Recent Labs  Lab 01/20/24 1200 01/20/24 1230 01/21/24 0459 01/22/24 0644  WBC 5.8  --  7.3 5.1  NEUTROABS 4.5  --   --   --   HGB 13.9  10.2* 12.6 12.9  HCT 42.0 30.0* 38.7 40.1  MCV 89.4  --  90.4 92.8  PLT 234  --  219 230   Basic Metabolic Panel: Recent Labs  Lab 01/20/24 1200 01/20/24 1230 01/21/24 0459 01/21/24 0837 01/21/24 1501 01/22/24 0644  NA 140 144 135  --   --  139  K 3.3* 2.1* 2.5*  --  4.2 3.5  CL 101 109 99  --   --  105  CO2 28  --  26  --   --  24  GLUCOSE 125* 97 110*  --   --  87  BUN 15 10 9   --   --  7*  CREATININE 0.58 0.30* 0.56  --   --  0.44  CALCIUM  9.2  --  9.1  --   --  9.2  MG 2.2  --   --  1.9  --  1.9  PHOS 2.6  --   --  2.8  --  2.7   GFR: Estimated Creatinine Clearance: 34.5 mL/min (by C-G formula based on SCr of 0.44 mg/dL). Liver Function Tests: Recent Labs  Lab 01/20/24 1200  AST 23  ALT 14  ALKPHOS 77  BILITOT 1.4*  PROT 6.6  ALBUMIN 3.5   Recent Labs  Lab 01/20/24 1200  LIPASE 42   Recent Labs  Lab 01/21/24 0837  AMMONIA 13   Coagulation Profile: Recent Labs  Lab 01/20/24 1653 01/21/24 0459  INR 1.0 1.0   Cardiac Enzymes: Recent Labs  Lab 01/20/24 1200  CKTOTAL 36*   BNP (last 3 results) No results for input(s): "PROBNP" in the last 8760 hours. HbA1C: No results for input(s): "HGBA1C" in the last 72 hours. CBG: No results for input(s): "GLUCAP" in the last 168 hours. Lipid Profile: No results for input(s): "CHOL", "HDL", "LDLCALC", "TRIG", "CHOLHDL", "LDLDIRECT" in the last 72 hours. Thyroid  Function Tests: Recent Labs    01/21/24 0459  TSH 6.026*  FREET4 1.08   Anemia Panel: Recent Labs    01/21/24 0459 01/21/24 0837  VITAMINB12 174*  --   FOLATE  --  8.3   Sepsis Labs: Recent Labs  Lab 01/20/24 1230  LATICACIDVEN 1.7    Recent Results (from the past 240 hours)  Culture, blood (routine x 2)     Status: None (Preliminary result)   Collection Time: 01/20/24 12:00 PM   Specimen: BLOOD  Result Value Ref Range Status   Specimen Description   Final    BLOOD LEFT ANTECUBITAL Performed at [facility],  [address], [place], [place] [address]    Special Requests   Final    BOTTLES DRAWN AEROBIC AND ANAEROBIC Blood Culture adequate volume Performed at [facility], [address] [place], [place] [address]  Culture   Final    NO GROWTH 2 DAYS Performed at [facility], [address], [place], [place] [address]    Report Status PENDING  Incomplete  Urine Culture     Status: Abnormal (Preliminary result)   Collection Time: 01/20/24  2:50 PM   Specimen: Urine, Random  Result Value Ref Range Status   Specimen Description   Final    URINE, RANDOM Performed at [facility], [address], [place], [place] [address]    Special Requests   Final    NONE Reflexed from [id number] Performed at [facility], [address] [place], [place] [address]    Culture (A)  Final    >=100,000 COLONIES/mL GRAM NEGATIVE RODS IDENTIFICATION AND SUSCEPTIBILITIES TO FOLLOW Performed at [facility], [address] [place], [place] [address]    Report Status PENDING  Incomplete  Culture, blood (routine x 2)     Status: None (Preliminary result)   Collection Time: 01/20/24  4:53 PM   Specimen: BLOOD LEFT ARM  Result Value Ref Range Status   Specimen Description   Final    BLOOD LEFT ARM Performed at [facility], [address], [place], [place] [address]    Special Requests   Final    BOTTLES DRAWN AEROBIC AND ANAEROBIC Blood Culture results may not be optimal due to an inadequate volume of blood received in culture bottles Performed at [facility], [address], [place], [place] [address]    Culture   Final    NO GROWTH 2 DAYS Performed at [facility], [address] [place], [place] [address]    Report Status PENDING  Incomplete         Radiology Studies: EEG adult Result Date: 01/21/2024 Arleene Lack, MD     01/21/2024  5:02 PM Patient Name: DANIJAH AVE MRN: [id number]  Epilepsy Attending: Arleene Lack Referring Physician/Provider: Bennie Brave, MD Date: 01/21/2024 Duration: 24.11 mins Patient history: 78yo F with ams. EEG to evaluate for seizure Level of alertness: Awake AEDs during EEG study: None Technical aspects: This EEG study was done with scalp electrodes positioned according to the 10-20 International system of electrode placement. Electrical activity was reviewed with band pass filter of 1-70Hz , sensitivity of 7 uV/mm, display speed of 78mm/sec with a 60Hz  notched filter applied as appropriate. EEG data were recorded continuously and digitally stored.  Video monitoring was available and reviewed as appropriate. Description: The posterior dominant rhythm consists of 8-9Hz  activity of moderate voltage (25-35 uV) seen predominantly in posterior head regions, symmetric and reactive to eye opening and eye closing. Physiologic photic driving was not seen during photic stimulation.  Hyperventilation was not performed.   IMPRESSION: This study is within normal limits. No seizures or epileptiform discharges were seen throughout the recording. A normal interictal EEG does not exclude the diagnosis of epilepsy. Arleene Lack   MR BRAIN WO CONTRAST Result Date: 01/20/2024 CLINICAL DATA:  Stroke suspected EXAM: MRI HEAD WITHOUT CONTRAST TECHNIQUE: Multiplanar, multiecho pulse sequences of the brain and surrounding structures were obtained without intravenous contrast. COMPARISON:  10/26/2021 FINDINGS: Brain: No acute infarct, mass effect or extra-axial collection. Chronic microhemorrhage in the left hemisphere deep white matter. There is multifocal hyperintense T2-weighted signal within the white matter. Generalized volume loss. The midline structures are normal. 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 Skull and upper  cervical spine: Normal calvarium and skull base. Visualized upper cervical spine and soft tissues are  normal. Sinuses/Orbits:Left maxillary retention cyst.  Normal orbits. IMPRESSION: 1. No acute intracranial abnormality. 2. 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 3. Atrophy and chronic microvascular ischemia. Electronically Signed   By: Juanetta Nordmann M.D.   On: 01/20/2024 23:15   CT CERVICAL SPINE WO CONTRAST Result Date: 01/20/2024 CLINICAL DATA:  Fall. EXAM: CT CERVICAL SPINE WITHOUT CONTRAST TECHNIQUE: Multidetector CT imaging of the cervical spine was performed without intravenous contrast. Multiplanar CT image reconstructions were also generated. RADIATION DOSE REDUCTION: This exam was performed according to the departmental dose-optimization program which includes automated exposure control, adjustment of the mA and/or kV according to patient size and/or use of iterative reconstruction technique. COMPARISON:  Cervical spine x-ray same day. Cervical spine CT 12/24/2023 plain skin FINDINGS: Alignment: There is 2 mm of anterolisthesis at C5-C6 similar to prior. Alignment is otherwise anatomic. Skull base and vertebrae: Diffuse osteopenia is present. No acute fracture or focal osseous lesion. Soft tissues and spinal canal: No prevertebral fluid or swelling. No visible canal hematoma. Disc levels: There is disc space narrowing throughout the cervical spine with endplate osteophyte formation most significant at C4-C5, C5-C6 and C6-C7. There is no severe central canal neural foraminal stenosis at any level. Upper chest: Not imaged. Other: None. IMPRESSION: 1. No acute fracture or traumatic subluxation of the cervical spine. 2. Multilevel degenerative changes. Electronically Signed   By: Tyron Gallon M.D.   On: 01/20/2024 23:02   DG Cervical Spine 2-3Vclearing Result Date: 01/20/2024 CLINICAL DATA:  [id number] Pain [id number] EXAM: LIMITED CERVICAL SPINE FOR TRAUMA CLEARING - 2-3 VIEW COMPARISON:  CT C-spine 05/25/2022 FINDINGS: On the lateral view the cervical spine is  visualized to the level of C6. Partially visualized extra scoliosis of the midthoracic spine. Mild levocurvature of the upper thoracic spine with mild dextrocurvature of the mid cervical spine. Normal cervical lordosis. Alignment is otherwise normal. Dens is well positioned between the lateral masses of C1. There is limited evaluation of the dens for acute fracture on the open-mouth view due to overlying osseous structures. Multilevel moderate degenerative changes of the spine. No acute displaced fracture is detected.No aggressive-appearing focal osseous lesions. Pre-vertebral soft tissues are within normal limits. Atherosclerotic plaque. Vascular clips and vascular stent overlie the neck. IMPRESSION: 1. Negative clearing view of cervical spine. Limited evaluation due to overlapping osseous structures and overlying soft tissues. 2.  Aortic Atherosclerosis (ICD10-I70.0). Electronically Signed   By: Morgane  Naveau M.D.   On: 01/20/2024 19:48   DG Thoracic Spine 2 View Result Date: 01/20/2024 CLINICAL DATA:  Pain after fall EXAM: THORACIC SPINE 2 VIEWS COMPARISON:  CT 12/24/2023 FINDINGS: Marked dextroscoliosis. Vertebral body heights are grossly maintained. Multilevel degenerative osteophytes. Subacute to chronic right sixth rib fracture IMPRESSION: Marked dextroscoliosis with multilevel degenerative change. No definite acute osseous abnormality Electronically Signed   By: Esmeralda Hedge M.D.   On: 01/20/2024 19:47   CT Head Wo Contrast Result Date: 01/20/2024 CLINICAL DATA:  Altered mental status. EXAM: CT HEAD WITHOUT CONTRAST TECHNIQUE: Contiguous axial images were obtained from the base of the skull through the vertex without intravenous contrast. RADIATION DOSE REDUCTION: This exam was performed according to the departmental dose-optimization program which includes automated exposure control, adjustment of the mA and/or kV according to patient size and/or use of iterative reconstruction technique.  COMPARISON:  Head CT dated 12/24/2023. FINDINGS: Brain: Moderate age-related atrophy and  chronic microvascular ischemic changes. There is no acute intracranial hemorrhage. Similar appearance of high density, partially calcified mass along the inferior right CP angle. No midline shift. No extra-axial fluid collection. Vascular: Right vertebral artery stent. Skull: No acute calvarial pathology. Right sub occipital craniectomy. Sinuses/Orbits: No acute finding. Other: None IMPRESSION: 1. No acute intracranial pathology. 2. Moderate age-related atrophy and chronic microvascular ischemic changes. 3. Similar appearance of mass along the inferior right CP angle. Electronically Signed   By: Angus Bark M.D.   On: 01/20/2024 14:22   DG Pelvis Portable Result Date: 01/20/2024 CLINICAL DATA:  Weakness EXAM: PORTABLE PELVIS 1-2 VIEWS COMPARISON:  None Available. FINDINGS: There is no evidence of pelvic fracture or diastasis. No pelvic bone lesions are seen. IMPRESSION: Negative. Electronically Signed   By: Fredrich Jefferson M.D.   On: 01/20/2024 14:04   DG Chest Portable 1 View Result Date: 01/20/2024 CLINICAL DATA:  Chest pain EXAM: PORTABLE CHEST 1 VIEW COMPARISON:  December 24, 2023 FINDINGS: The heart size and mediastinal contours are within normal limits. Both lungs are clear. The visualized skeletal structures are unremarkable. Severe dextro rotoscoliosis thoracic spine. IMPRESSION: No active disease. Electronically Signed   By: Fredrich Jefferson M.D.   On: 01/20/2024 14:00           LOS: 1 day   Time spent= 35 mins    Maggie Schooner, MD [facility]  If 7PM-7AM, please contact night-coverage  01/22/2024, 10:14 AM

## 2024-01-22 NOTE — Evaluation (Signed)
 Physical Therapy Evaluation Patient Details Name: Elaine Rivera MRN: [id number] DOB: 1946/02/02 Today's Date: 01/22/2024  History of Present Illness  Patient is a 78 year old female who presented to ED on 3/28 s/p fall at home.daughter reported that she went over to check on mother and was told that she had a fall.  patient has also been having some persistent diarrhea. PMH: depression, dementia, prior CVA, GI bleeding, iron deficiency anemia, scoliosis.  Clinical Impression  Pt admitted with above diagnosis. Limited eval. Pt states that she lives at home with her significant other, requires inc assistance with mobility. Has 2 steps with bil HR to enter/exit home. Pt reports pain in her c/s and T/s today 6/10. Able to initiate LE movements but inc pain, declines further attempts this day. Agreeable to subsequent follow up. Pt currently with functional limitations due to the deficits listed below (see PT Problem List). Pt will benefit from acute skilled PT to increase their independence and safety with mobility to allow discharge.           If plan is discharge home, recommend the following: A lot of help with walking and/or transfers;A lot of help with bathing/dressing/bathroom;Assistance with cooking/housework;Assist for transportation;Help with stairs or ramp for entrance   Can travel by private vehicle   No    Equipment Recommendations None recommended by PT  Recommendations for Other Services       Functional Status Assessment Patient has had a recent decline in their functional status and demonstrates the ability to make significant improvements in function in a reasonable and predictable amount of time.     Precautions / Restrictions Precautions Precautions: Fall;Other (comment) Precaution/Restrictions Comments: scoliosis Restrictions Weight Bearing Restrictions Per Provider Order: No      Mobility  Bed Mobility Overal bed mobility: Needs Assistance Bed Mobility: Supine  to Sit     Supine to sit: Min assist     General bed mobility comments: able to initiate movement towards EOB, does not progress 2* pain and declines further attempts today    Transfers                        Ambulation/Gait                  Stairs            Wheelchair Mobility     Tilt Bed    Modified Rankin (Stroke Patients Only)       Balance                                             Pertinent Vitals/Pain Pain Assessment Pain Assessment: 0-10 Pain Score: 6  Pain Location: cervical and thoracic spine Pain Descriptors / Indicators: Discomfort, Aching, Constant Pain Intervention(s): Monitored during session, Limited activity within patient's tolerance    Home Living Family/patient expects to be discharged to:: Skilled nursing facility Living Arrangements: Spouse/significant other                      Prior Function Prior Level of Function : Needs assist;History of Falls (last six months)             Mobility Comments: pt and daughter reports multiple falls prior to admission, uses rollator at baseline ADLs Comments: patients daughter reported pt's friend had moved in to help  with caregiving but was not "doing a good job". Pt was able to do her ADLs once home from [facility] this month, but since being back at home she stays in bed most of the time, doesn't take her meds, doesn't eat much     Extremity/Trunk Assessment        Lower Extremity Assessment Lower Extremity Assessment: Generalized weakness    Cervical / Trunk Assessment Cervical / Trunk Assessment: Kyphotic Cervical / Trunk Exceptions: scoliosis  Communication   Communication Communication: No apparent difficulties    Cognition Arousal: Alert Behavior During Therapy: WFL for tasks assessed/performed   PT - Cognitive impairments: No apparent impairments                                 Cueing Cueing Techniques: Verbal  cues     General Comments      Exercises     Assessment/Plan    PT Assessment Patient needs continued PT services  PT Problem List Decreased strength;Decreased activity tolerance;Decreased mobility;Decreased cognition       PT Treatment Interventions DME instruction;Functional mobility training;Balance training;Patient/family education;Gait training;Therapeutic activities;Stair training;Therapeutic exercise    PT Goals (Current goals can be found in the Care Plan section)  Acute Rehab PT Goals Patient Stated Goal: none stated PT Goal Formulation: With patient Time For Goal Achievement: 02/05/24 Potential to Achieve Goals: Good    Frequency Min 3X/week     Co-evaluation               AM-PAC PT "6 Clicks" Mobility  Outcome Measure Help needed turning from your back to your side while in a flat bed without using bedrails?: A Lot Help needed moving from lying on your back to sitting on the side of a flat bed without using bedrails?: A Lot Help needed moving to and from a bed to a chair (including a wheelchair)?: A Lot Help needed standing up from a chair using your arms (e.g., wheelchair or bedside chair)?: A Lot Help needed to walk in hospital room?: A Lot Help needed climbing 3-5 steps with a railing? : Total 6 Click Score: 11    End of Session   Activity Tolerance: Patient limited by pain Patient left: in bed;with bed alarm set;with call bell/phone within reach Nurse Communication: Mobility status PT Visit Diagnosis: Difficulty in walking, not elsewhere classified (R26.2);Muscle weakness (generalized) (M62.81)    Time: 1610-9604 PT Time Calculation (min) (ACUTE ONLY): 11 min   Charges:   PT Evaluation $PT Eval Low Complexity: 1 Low   PT General Charges $$ ACUTE PT VISIT: 1 Visit         Darien Eden, PT Acute Rehabilitation Services Office: [phone] 01/22/2024   Serafin Dames 01/22/2024, 2:01 PM

## 2024-01-22 NOTE — Plan of Care (Signed)

## 2024-01-23 DIAGNOSIS — R400 Somnolence: Secondary | ICD-10-CM | POA: Diagnosis not present

## 2024-01-23 LAB — BASIC METABOLIC PANEL WITH GFR
Anion gap: 8 (ref 5–15)
BUN: 11 mg/dL (ref 8–23)
CO2: 26 mmol/L (ref 22–32)
Calcium: 9.6 mg/dL (ref 8.9–10.3)
Chloride: 102 mmol/L (ref 98–111)
Creatinine, Ser: 0.53 mg/dL (ref 0.44–1.00)
GFR, Estimated: >60 mL/min (ref >60)
Glucose, Bld: 91 mg/dL (ref 70–99)
Potassium: 4 mmol/L (ref 3.5–5.1)
Sodium: 136 mmol/L (ref 135–145)

## 2024-01-23 LAB — CBC
HCT: 39.3 % (ref 36.0–46.0)
Hemoglobin: 12.9 g/dL (ref 12.0–15.0)
MCH: 29.9 pg (ref 26.0–34.0)
MCHC: 32.8 g/dL (ref 30.0–36.0)
MCV: 91 fL (ref 80.0–100.0)
Platelets: 237 10*3/uL (ref 150–400)
RBC: 4.32 MIL/uL (ref 3.87–5.11)
RDW: 14.1 % (ref 11.5–15.5)
WBC: 5 10*3/uL (ref 4.0–10.5)
nRBC: 0 % (ref 0.0–0.2)

## 2024-01-23 LAB — CALCIUM, IONIZED: Calcium, Ionized, Serum: 5.1 mg/dL (ref 4.5–5.6)

## 2024-01-23 LAB — MAGNESIUM: Magnesium: 1.9 mg/dL (ref 1.7–2.4)

## 2024-01-23 MED ORDER — CEFAZOLIN SODIUM-DEXTROSE 1-4 GM/50ML-% IV SOLN
1.0000 g | Freq: Two times a day (BID) | INTRAVENOUS | Status: DC
  Administered 2024-01-23 – 2024-01-25 (×4): 1 g via INTRAVENOUS
  Filled 2024-01-23 (×4): qty 50

## 2024-01-23 NOTE — Plan of Care (Signed)
 VSS. Patient c/o neck and back pain, given PRN Oxycodone . SCDs in place bilaterally. No acute events overnight.  Problem: Education: Goal: Knowledge of General Education information will improve Description: Including pain rating scale, medication(s)/side effects and non-pharmacologic comfort measures Outcome: Progressing   Problem: Clinical Measurements: Goal: Ability to maintain clinical measurements within normal limits will improve Outcome: Progressing Goal: Will remain free from infection Outcome: Progressing   Problem: Activity: Goal: Risk for activity intolerance will decrease Outcome: Progressing   Problem: Elimination: Goal: Will not experience complications related to urinary retention Outcome: Progressing   Problem: Pain Managment: Goal: General experience of comfort will improve and/or be controlled Outcome: Progressing   Problem: Safety: Goal: Ability to remain free from injury will improve Outcome: Progressing   Problem: Skin Integrity: Goal: Risk for impaired skin integrity will decrease Outcome: Progressing   Problem: Urinary Elimination: Goal: Signs and symptoms of infection will decrease Outcome: Progressing

## 2024-01-23 NOTE — Progress Notes (Signed)
 PROGRESS NOTE    Elaine Rivera  ZOX:[id number] DOB: May 26, 1946 DOA: 01/20/2024 PCP: Alta Jersey, NP    Brief Narrative:  78 year old with history of depression, dementia, prior CVA, GI bleeding, iron deficiency anemia discharged from: On April 1 after being admitted for a fall at home.  Eventually was discharged to rehab and recently returned home.  Apparently patient has been falling at home with increasing confusion therefore brought back to the hospital.  During the hospitalization her mentation improved, extensive workup as mentioned below has been negative.  She was treated for for urinary tract infection as well.  Eventually cultures grew Klebsiella which was pansensitive therefore she was transitioned to Rocephin .  PT/OT recommended SNF.  All the imaging and metabolic workup otherwise has been negative.   Assessment & Plan:  Principal Problem:   AMS (altered mental status) Active Problems:   HTN (hypertension)   Back pain   AMS (altered mental status), improved Fall - Overall progressive decline at home.  Mentation have cleared up now..  Currently his trauma workup including CT head, MRI brain, cervical spine, chest x-ray, pelvic x-ray are all negative. EEG - neg -CK levels are normal - Folate normal.  Elevated TSH and low B12 -PT/OT = SNF  Vitamin B12 deficiency - Supplements started  Hypokalemia/hypocalcemia - Replete as needed  Urinary tract infection - Urine cultures are growing Klebsiella.  Transition antibiotics to Ancef   Elevated TSH - Free T4 is normal.  Repeat thyroid  studies outpatient in about 2-4 weeks  Vitamin B12 deficiency - Started B12 supplements   Back pain -Likely chronic from falls   HTN (hypertension)   Depression No suicidal ideation, continue with Lexapro    PT/OT-SNF.  TOC consulted  DVT prophylaxis: Lovenox     Code Status: Full Code Family Communication: Daughter updated 4/27, she is out of [place] now but will check in  if and when she can.  SNF placement.   Subjective: Doing better. PO intake still remains poor.   Examination:  General exam: Appears calm and comfortable  Respiratory system: Clear to auscultation. Respiratory effort normal. Cardiovascular system: S1 & S2 heard, RRR. No JVD, murmurs, rubs, gallops or clicks. No pedal edema. Gastrointestinal system: Abdomen is nondistended, soft and nontender. No organomegaly or masses felt. Normal bowel sounds heard. Central nervous system: Alert and oriented. No focal neurological deficits. Extremities: Symmetric 5 x 5 power. Skin: No rashes, lesions or ulcers Psychiatry: Judgement and insight appear normal. Mood & affect appropriate.                Diet Orders (From admission, onward)     Start     Ordered   01/20/24 1743  Diet regular Fluid consistency: Thin  Diet effective now       Question:  Fluid consistency:  Answer:  Thin   01/20/24 1742            Objective: Vitals:   01/22/24 0348 01/22/24 1306 01/22/24 2038 01/23/24 0619  BP: (!) 150/80 136/86 120/73 (!) 149/81  Pulse: 61 88 67 61  Resp: 18 18 18 18   Temp: 97.6 F (36.4 C) 98 F (36.7 C) (!) 97.5 F (36.4 C) (!) 97.5 F (36.4 C)  TempSrc:   Oral Oral  SpO2: 99% 95% 97% 97%  Weight:      Height:        Intake/Output Summary (Last 24 hours) at 01/23/2024 1116 Last data filed at 01/23/2024 8119 Gross per 24 hour  Intake 840 ml  Output 1800 ml  Net -960 ml   Filed Weights   01/20/24 1633  Weight: 37.7 kg    Scheduled Meds:  vitamin B-12  1,000 mcg Oral Daily   enoxaparin  (LOVENOX ) injection  30 mg Subcutaneous Q24H   escitalopram   10 mg Oral Daily   feeding supplement  237 mL Oral BID BM   ferrous sulfate   325 mg Oral Q breakfast   pantoprazole   40 mg Oral Daily   sodium chloride  flush  3 mL Intravenous Q12H   thiamine  (VITAMIN B1) injection  100 mg Intravenous Daily   Continuous Infusions:   ceFAZolin  (ANCEF ) IV      Nutritional status      Body mass index is 16.8 kg/m.  Data Reviewed:   CBC: Recent Labs  Lab 01/20/24 1200 01/20/24 1230 01/21/24 0459 01/22/24 0644 01/23/24 0818  WBC 5.8  --  7.3 5.1 5.0  NEUTROABS 4.5  --   --   --   --   HGB 13.9 10.2* 12.6 12.9 12.9  HCT 42.0 30.0* 38.7 40.1 39.3  MCV 89.4  --  90.4 92.8 91.0  PLT 234  --  219 230 237   Basic Metabolic Panel: Recent Labs  Lab 01/20/24 1200 01/20/24 1230 01/21/24 0459 01/21/24 0837 01/21/24 1501 01/22/24 0644 01/23/24 0818  NA 140 144 135  --   --  139 136  K 3.3* 2.1* 2.5*  --  4.2 3.5 4.0  CL 101 109 99  --   --  105 102  CO2 28  --  26  --   --  24 26  GLUCOSE 125* 97 110*  --   --  87 91  BUN 15 10 9   --   --  7* 11  CREATININE 0.58 0.30* 0.56  --   --  0.44 0.53  CALCIUM  9.2  --  9.1  --   --  9.2 9.6  MG 2.2  --   --  1.9  --  1.9 1.9  PHOS 2.6  --   --  2.8  --  2.7  --    GFR: Estimated Creatinine Clearance: 34.5 mL/min (by C-G formula based on SCr of 0.53 mg/dL). Liver Function Tests: Recent Labs  Lab 01/20/24 1200  AST 23  ALT 14  ALKPHOS 77  BILITOT 1.4*  PROT 6.6  ALBUMIN 3.5   Recent Labs  Lab 01/20/24 1200  LIPASE 42   Recent Labs  Lab 01/21/24 0837  AMMONIA 13   Coagulation Profile: Recent Labs  Lab 01/20/24 1653 01/21/24 0459  INR 1.0 1.0   Cardiac Enzymes: Recent Labs  Lab 01/20/24 1200  CKTOTAL 36*   BNP (last 3 results) No results for input(s): "PROBNP" in the last 8760 hours. HbA1C: No results for input(s): "HGBA1C" in the last 72 hours. CBG: No results for input(s): "GLUCAP" in the last 168 hours. Lipid Profile: No results for input(s): "CHOL", "HDL", "LDLCALC", "TRIG", "CHOLHDL", "LDLDIRECT" in the last 72 hours. Thyroid  Function Tests: Recent Labs    01/21/24 0459  TSH 6.026*  FREET4 1.08   Anemia Panel: Recent Labs    01/21/24 0459 01/21/24 0837  VITAMINB12 174*  --   FOLATE  --  8.3   Sepsis Labs: Recent Labs  Lab 01/20/24 1230  LATICACIDVEN 1.7     Recent Results (from the past 240 hours)  Culture, blood (routine x 2)     Status: None (Preliminary result)   Collection Time: 01/20/24 12:00 PM  Specimen: BLOOD  Result Value Ref Range Status   Specimen Description   Final    BLOOD LEFT ANTECUBITAL Performed at [facility], [address], [place], [place] [address]    Special Requests   Final    BOTTLES DRAWN AEROBIC AND ANAEROBIC Blood Culture adequate volume Performed at [facility], [address] [place], [place] [address]    Culture   Final    NO GROWTH 3 DAYS Performed at [facility], [address] [place], [place] [address]    Report Status PENDING  Incomplete  Urine Culture     Status: Abnormal   Collection Time: 01/20/24  2:50 PM   Specimen: Urine, Random  Result Value Ref Range Status   Specimen Description   Final    URINE, RANDOM Performed at [facility], [address], [place], [place] [address]    Special Requests   Final    NONE Reflexed from [id number] Performed at [facility], [address] [place], [place] [address]    Culture >=100,000 COLONIES/mL KLEBSIELLA PNEUMONIAE (A)  Final   Report Status 01/22/2024 FINAL  Final   Organism ID, Bacteria KLEBSIELLA PNEUMONIAE (A)  Final      Susceptibility   Klebsiella pneumoniae - MIC*    AMPICILLIN >=32 RESISTANT Resistant     CEFAZOLIN  <=4 SENSITIVE Sensitive     CEFEPIME <=0.12 SENSITIVE Sensitive     CEFTRIAXONE  <=0.25 SENSITIVE Sensitive     CIPROFLOXACIN  <=0.25 SENSITIVE Sensitive     GENTAMICIN  <=1 SENSITIVE Sensitive     IMIPENEM 0.5 SENSITIVE Sensitive     NITROFURANTOIN 256 RESISTANT Resistant     TRIMETH/SULFA <=20 SENSITIVE Sensitive     AMPICILLIN/SULBACTAM 16 INTERMEDIATE Intermediate     PIP/TAZO 16 SENSITIVE Sensitive ug/mL    * >=100,000 COLONIES/mL KLEBSIELLA PNEUMONIAE  Culture, blood (routine x 2)     Status: None (Preliminary result)    Collection Time: 01/20/24  4:53 PM   Specimen: BLOOD LEFT ARM  Result Value Ref Range Status   Specimen Description   Final    BLOOD LEFT ARM Performed at [facility], [address], [place], [place] [address]    Special Requests   Final    BOTTLES DRAWN AEROBIC AND ANAEROBIC Blood Culture results may not be optimal due to an inadequate volume of blood received in culture bottles Performed at [facility], [address], [place], [place] [address]    Culture   Final    NO GROWTH 3 DAYS Performed at [facility], [address] [place], [place] [address]    Report Status PENDING  Incomplete         Radiology Studies: EEG adult Result Date: 01/21/2024 Arleene Lack, MD     01/21/2024  5:02 PM Patient Name: Elaine Rivera MRN: [id number] Epilepsy Attending: Arleene Lack Referring Physician/Provider: Bennie Brave, MD Date: 01/21/2024 Duration: 24.11 mins Patient history: 78yo F with ams. EEG to evaluate for seizure Level of alertness: Awake AEDs during EEG study: None Technical aspects: This EEG study was done with scalp electrodes positioned according to the 10-20 International system of electrode placement. Electrical activity was reviewed with band pass filter of 1-70Hz , sensitivity of 7 uV/mm, display speed of 68mm/sec with a 60Hz  notched filter applied as appropriate. EEG data were recorded continuously and digitally stored.  Video monitoring was available and reviewed as appropriate. Description: The posterior dominant rhythm  consists of 8-9Hz  activity of moderate voltage (25-35 uV) seen predominantly in posterior head regions, symmetric and reactive to eye opening and eye closing. Physiologic photic driving was not seen during photic stimulation.  Hyperventilation was not performed.   IMPRESSION: This study is within normal limits. No seizures or epileptiform discharges were seen throughout the recording. A normal interictal EEG does not exclude  the diagnosis of epilepsy. Priyanka O Yadav           LOS: 2 days   Time spent= 35 mins    Maggie Schooner, MD [facility]  If 7PM-7AM, please contact night-coverage  01/23/2024, 11:16 AM

## 2024-01-23 NOTE — Plan of Care (Signed)

## 2024-01-24 DIAGNOSIS — R410 Disorientation, unspecified: Secondary | ICD-10-CM | POA: Diagnosis not present

## 2024-01-24 LAB — CBC
HCT: 38.9 % (ref 36.0–46.0)
Hemoglobin: 12.7 g/dL (ref 12.0–15.0)
MCH: 29.3 pg (ref 26.0–34.0)
MCHC: 32.6 g/dL (ref 30.0–36.0)
MCV: 89.8 fL (ref 80.0–100.0)
Platelets: 225 10*3/uL (ref 150–400)
RBC: 4.33 MIL/uL (ref 3.87–5.11)
RDW: 13.9 % (ref 11.5–15.5)
WBC: 5.1 10*3/uL (ref 4.0–10.5)
nRBC: 0 % (ref 0.0–0.2)

## 2024-01-24 LAB — BASIC METABOLIC PANEL WITH GFR
Anion gap: 9 (ref 5–15)
BUN: 18 mg/dL (ref 8–23)
CO2: 25 mmol/L (ref 22–32)
Calcium: 9.4 mg/dL (ref 8.9–10.3)
Chloride: 99 mmol/L (ref 98–111)
Creatinine, Ser: 0.69 mg/dL (ref 0.44–1.00)
GFR, Estimated: >60 mL/min (ref >60)
Glucose, Bld: 97 mg/dL (ref 70–99)
Potassium: 3.9 mmol/L (ref 3.5–5.1)
Sodium: 133 mmol/L — ABNORMAL LOW (ref 135–145)

## 2024-01-24 LAB — MAGNESIUM: Magnesium: 2 mg/dL (ref 1.7–2.4)

## 2024-01-24 MED ORDER — BOOST / RESOURCE BREEZE PO LIQD CUSTOM
1.0000 | Freq: Three times a day (TID) | ORAL | Status: DC
  Administered 2024-01-24 – 2024-01-25 (×3): 1 via ORAL

## 2024-01-24 MED ORDER — SENNOSIDES-DOCUSATE SODIUM 8.6-50 MG PO TABS: 1.0000 | ORAL_TABLET | Freq: Every evening | ORAL | Status: AC | PRN

## 2024-01-24 MED ORDER — POLYETHYLENE GLYCOL 3350 17 G PO PACK: 17.0000 g | PACK | Freq: Every day | ORAL | Status: DC | PRN

## 2024-01-24 MED ORDER — ENSURE ENLIVE PO LIQD: 237.0000 mL | Freq: Two times a day (BID) | ORAL | Status: DC

## 2024-01-24 MED ORDER — OXYCODONE HCL 5 MG PO TABS
5.0000 mg | ORAL_TABLET | Freq: Four times a day (QID) | ORAL | 0 refills | Status: DC | PRN
Start: 2024-01-24 — End: 2024-04-12

## 2024-01-24 NOTE — TOC Progression Note (Signed)
 Transition of Care [facility]) - Progression Note    Patient Details  Name: Elaine Rivera MRN: [id number] Date of Birth: November 27, 1945  [facility] CM/SW Contact  Tessie Fila, RN Phone Number: 01/24/2024, 10:32 AM  Clinical Narrative:    Met with pt to discuss plan for SNF at discharge. Pt hesitant at first but agreed for CM to sent out referrals for SNF placement. Pt daughter aware and agreeable to the plan for SNF at discharge. Sent of SNF referrals, told pt she would be updated once bed offers are available.   Expected Discharge Plan: Skilled Nursing Facility Barriers to Discharge: Continued Medical Work up  Expected Discharge Plan and Services In-house Referral: Clinical Social Work Discharge Planning Services: NA Post Acute Care Choice: Nursing Home, Skilled Nursing Facility Living arrangements for the past 2 months: Apartment                 DME Arranged: N/A DME Agency: NA                   Social Determinants of Health (SDOH) Interventions SDOH Screenings   Food Insecurity: No Food Insecurity (01/20/2024)  Housing: Low Risk  (01/20/2024)  Transportation Needs: No Transportation Needs (01/20/2024)  Utilities: Not At Risk (01/20/2024)  Social Connections: Socially Isolated (01/20/2024)  Tobacco Use: Medium Risk (01/20/2024)    Readmission Risk Interventions    11/13/2021    1:56 PM  Readmission Risk Prevention Plan  Transportation Screening Complete  PCP or Specialist Appt within 5-7 Days Complete  Home Care Screening Complete  Medication Review (RN CM) Complete

## 2024-01-24 NOTE — Progress Notes (Signed)
 PT Cancellation Note  Patient Details Name: Elaine Rivera MRN: [id number] DOB: December 13, 1945   Cancelled Treatment:    Reason Eval/Treat Not Completed: Pain limiting ability to participate. Pt refused PT, she stated she's hurting too much in her neck and back. RN notified of pt request for pain medication. Will follow.    Lynann Sandman Kistler PT 01/24/2024  Acute Rehabilitation Services  Office [phone]

## 2024-01-24 NOTE — [place] FL2 (Signed)
 [place]  MEDICAID FL2 LEVEL OF CARE FORM     IDENTIFICATION  Patient Name: Elaine Rivera Birthdate: 1945/10/13 Sex: female Admission Date (Current Location): 01/20/2024  [facility] and [place] Number:  Producer, television/film/video and Address:  [facility],  [address], [place] [address]      Provider Number:    Attending Physician Name and Address:  Maggie Schooner, MD  Relative Name and Phone Number:  Olukemi, Clotfelter (Daughter)  [phone]    Current Level of Care: Hospital Recommended Level of Care: Skilled Nursing Facility Prior Approval Number:    Date Approved/Denied:   PASRR Number: [id number]  Discharge Plan: SNF    Current Diagnoses: Patient Active Problem List   Diagnosis Date Noted   AMS (altered mental status) 01/20/2024   Back pain 01/20/2024   Accident due to mechanical fall without injury 12/24/2023   Symptomatic anemia 03/16/2023   Palliative care encounter 12/30/2021   FTT (failure to thrive) in adult 11/13/2021   GI bleed 11/13/2021   Acute lower GI bleeding 11/11/2021   Acute blood loss anemia 11/11/2021   Melena 11/11/2021   Seizure disorder (HCC) 10/27/2021   Protein-calorie malnutrition, severe 08/26/2021   Failure to thrive in adult 08/23/2021   Right 10th rib fracture 08/23/2021   Physical deconditioning 08/23/2021   Bad headache 03/14/2021   Seizure (HCC) 03/06/2021   CVA (cerebral vascular accident) (HCC) 03/06/2021   Ischemic cerebrovascular accident (CVA) (HCC) 03/05/2021   Hypokalemia 03/05/2021   Anxiety 03/05/2021   Transient speech disturbance 03/05/2021   Lung nodule 03/05/2021   Aphasia    Fall    Anemia 02/28/2020   Shortness of breath 01/24/2020   Dizziness 01/24/2020   Herpes 10/04/2018   Cerebral aneurysm 09/05/2018   NSAID long-term use 04/20/2016   Scoliosis 04/20/2016   Depression 04/20/2016   HTN (hypertension) 04/20/2016   GERD (gastroesophageal reflux disease) 04/20/2016   Occult GI bleeding  04/20/2016    Orientation RESPIRATION BLADDER Height & Weight     Self, Place  Normal Incontinent Weight: 37.7 kg Height:  4\' 11"  (149.9 cm)  BEHAVIORAL SYMPTOMS/MOOD NEUROLOGICAL BOWEL NUTRITION STATUS      Incontinent Diet (Regular Diet)  AMBULATORY STATUS COMMUNICATION OF NEEDS Skin   Limited Assist Verbally Normal                       Personal Care Assistance Level of Assistance  Bathing, Feeding, Dressing Bathing Assistance: Limited assistance Feeding assistance: Independent Dressing Assistance: Maximum assistance     Functional Limitations Info  Sight, Hearing, Speech Sight Info: Impaired Hearing Info: Adequate Speech Info: Adequate    SPECIAL CARE FACTORS FREQUENCY  PT (By licensed PT), OT (By licensed OT)     PT Frequency: 5xwk OT Frequency: 5xwk            Contractures Contractures Info: Not present    Additional Factors Info  Code Status, Allergies Code Status Info: FULL Allergies Info: Sulfa antibiotics, tramadol            Current Medications (01/24/2024):  This is the current hospital active medication list Current Facility-Administered Medications  Medication Dose Route Frequency Provider Last Rate Last Admin   acetaminophen  (TYLENOL ) tablet 650 mg  650 mg Oral Q6H PRN Bennie Brave, MD       Or   acetaminophen  (TYLENOL ) suppository 650 mg  650 mg Rectal Q6H PRN Bennie Brave, MD       ceFAZolin  (ANCEF ) IVPB 1 g/50 mL  premix  1 g Intravenous Q12H Amin, Ankit C, MD 100 mL/hr at 01/23/24 2126 1 g at 01/23/24 2126   cyanocobalamin  (VITAMIN B12) tablet 1,000 mcg  1,000 mcg Oral Daily Amin, Ankit C, MD   1,000 mcg at 01/24/24 0934   enoxaparin  (LOVENOX ) injection 30 mg  30 mg Subcutaneous Q24H Bennie Brave, MD   30 mg at 01/24/24 0934   escitalopram  (LEXAPRO ) tablet 10 mg  10 mg Oral Daily Goel, Hersh, MD   10 mg at 01/24/24 0934   feeding supplement (ENSURE ENLIVE / ENSURE PLUS) liquid 237 mL  237 mL Oral BID BM Bennie Brave, MD   237 mL at 01/21/24  8119   ferrous sulfate  tablet 325 mg  325 mg Oral Q breakfast Bennie Brave, MD   325 mg at 01/24/24 1478   guaiFENesin  (ROBITUSSIN) 100 MG/5ML liquid 5 mL  5 mL Oral Q4H PRN Amin, Ankit C, MD       hydrALAZINE  (APRESOLINE ) injection 10 mg  10 mg Intravenous Q4H PRN Bennie Brave, MD   10 mg at 01/20/24 1718   ipratropium-albuterol  (DUONEB) 0.5-2.5 (3) MG/3ML nebulizer solution 3 mL  3 mL Nebulization Q4H PRN Amin, Ankit C, MD       metoprolol  tartrate (LOPRESSOR ) injection 5 mg  5 mg Intravenous Q4H PRN Amin, Ankit C, MD       ondansetron  (ZOFRAN ) injection 4 mg  4 mg Intravenous Q6H PRN Amin, Ankit C, MD       oxyCODONE  (Oxy IR/ROXICODONE ) immediate release tablet 5 mg  5 mg Oral Q4H PRN Amin, Ankit C, MD   5 mg at 01/24/24 0951   pantoprazole  (PROTONIX ) EC tablet 40 mg  40 mg Oral Daily Goel, Hersh, MD   40 mg at 01/24/24 0934   polyethylene glycol (MIRALAX  / GLYCOLAX ) packet 17 g  17 g Oral Daily PRN Bennie Brave, MD       senna-docusate (Senokot-S) tablet 1 tablet  1 tablet Oral QHS PRN Amin, Ankit C, MD       sodium chloride  flush (NS) 0.9 % injection 3 mL  3 mL Intravenous Q12H Goel, Hersh, MD   3 mL at 01/23/24 2126   thiamine  (VITAMIN B1) injection 100 mg  100 mg Intravenous Daily Goel, Hersh, MD   100 mg at 01/23/24 2956     Discharge Medications: Please see discharge summary for a list of discharge medications.  Relevant Imaging Results:  Relevant Lab Results:   Additional Information [id number]  Tessie Fila, RN

## 2024-01-24 NOTE — Plan of Care (Signed)
   Problem: Activity: Goal: Risk for activity intolerance will decrease Outcome: Progressing   Problem: Safety: Goal: Ability to remain free from injury will improve Outcome: Progressing   Problem: Skin Integrity: Goal: Risk for impaired skin integrity will decrease Outcome: Progressing

## 2024-01-24 NOTE — Progress Notes (Signed)
 PROGRESS NOTE    Elaine Rivera  ZOX:[id number] DOB: 1945-11-09 DOA: 01/20/2024 PCP: Alta Jersey, NP    Brief Narrative:  78 year old with history of depression, dementia, prior CVA, GI bleeding, iron deficiency anemia discharged from: On April 1 after being admitted for a fall at home.  Eventually was discharged to rehab and recently returned home.  Apparently patient has been falling at home with increasing confusion therefore brought back to the hospital.  During the hospitalization her mentation improved, extensive workup as mentioned below has been negative.  She was treated for for urinary tract infection as well.  Eventually cultures grew Klebsiella which was pansensitive therefore she was transitioned to Rocephin .  PT/OT recommended SNF.  All the imaging and metabolic workup otherwise has been negative. Awaiting SNF placement.  Assessment & Plan:  Principal Problem:   AMS (altered mental status) Active Problems:   HTN (hypertension)   Back pain   AMS (altered mental status), improved Fall - Overall progressive decline at home.  Mentation have cleared up now..  Currently his trauma workup including CT head, MRI brain, cervical spine, chest x-ray, pelvic x-ray are all negative. EEG - neg -CK levels are normal - Folate normal.  Elevated TSH and low B12 -PT/OT = SNF  Vitamin B12 deficiency - Supplements started  Hypokalemia/hypocalcemia - Replete as needed  Urinary tract infection - Urine cultures are growing Klebsiella.  Transition antibiotics to Ancef , EOT 4/28  Elevated TSH - Free T4 is normal.  Repeat thyroid  studies outpatient in about 2-4 weeks  Vitamin B12 deficiency - Started B12 supplements   Back pain -Likely chronic from falls   HTN (hypertension)   Depression No suicidal ideation, continue with Lexapro    PT/OT-SNF.  TOC consulted  DVT prophylaxis: Lovenox     Code Status: Full Code Family Communication: Daughter updated 4/27, she is out  of country now but will check in if and when she can.  SNF placement.   Subjective: Doing well no complaints  Examination:  General exam: Appears calm and comfortable, elderly frail Respiratory system: Clear to auscultation. Respiratory effort normal. Cardiovascular system: S1 & S2 heard, RRR. No JVD, murmurs, rubs, gallops or clicks. No pedal edema. Gastrointestinal system: Abdomen is nondistended, soft and nontender. No organomegaly or masses felt. Normal bowel sounds heard. Central nervous system: Alert and oriented. No focal neurological deficits. Extremities: Symmetric 5 x 5 power. Skin: No rashes, lesions or ulcers Psychiatry: Judgement and insight appear normal. Mood & affect appropriate.                Diet Orders (From admission, onward)     Start     Ordered   01/20/24 1743  Diet regular Fluid consistency: Thin  Diet effective now       Question:  Fluid consistency:  Answer:  Thin   01/20/24 1742            Objective: Vitals:   01/23/24 0619 01/23/24 1227 01/23/24 1929 01/24/24 0500  BP: (!) 149/81 (!) 145/79 119/71 130/77  Pulse: 61 80 86 63  Resp: 18 20 15 16   Temp: (!) 97.5 F (36.4 C) 98.2 F (36.8 C) 98 F (36.7 C) 98.3 F (36.8 C)  TempSrc: Oral     SpO2: 97% 97% 93% 97%  Weight:      Height:        Intake/Output Summary (Last 24 hours) at 01/24/2024 1038 Last data filed at 01/23/2024 2126 Gross per 24 hour  Intake 660 ml  Output 750 ml  Net -90 ml   Filed Weights   01/20/24 1633  Weight: 37.7 kg    Scheduled Meds:  vitamin B-12  1,000 mcg Oral Daily   enoxaparin  (LOVENOX ) injection  30 mg Subcutaneous Q24H   escitalopram   10 mg Oral Daily   feeding supplement  237 mL Oral BID BM   ferrous sulfate   325 mg Oral Q breakfast   pantoprazole   40 mg Oral Daily   sodium chloride  flush  3 mL Intravenous Q12H   thiamine  (VITAMIN B1) injection  100 mg Intravenous Daily   Continuous Infusions:   ceFAZolin  (ANCEF ) IV 1 g (01/23/24  2126)    Nutritional status     Body mass index is 16.8 kg/m.  Data Reviewed:   CBC: Recent Labs  Lab 01/20/24 1200 01/20/24 1230 01/21/24 0459 01/22/24 0644 01/23/24 0818 01/24/24 0852  WBC 5.8  --  7.3 5.1 5.0 5.1  NEUTROABS 4.5  --   --   --   --   --   HGB 13.9 10.2* 12.6 12.9 12.9 12.7  HCT 42.0 30.0* 38.7 40.1 39.3 38.9  MCV 89.4  --  90.4 92.8 91.0 89.8  PLT 234  --  219 230 237 225   Basic Metabolic Panel: Recent Labs  Lab 01/20/24 1200 01/20/24 1230 01/21/24 0459 01/21/24 0837 01/21/24 1501 01/22/24 0644 01/23/24 0818 01/24/24 0644  NA 140 144 135  --   --  139 136 133*  K 3.3* 2.1* 2.5*  --  4.2 3.5 4.0 3.9  CL 101 109 99  --   --  105 102 99  CO2 28  --  26  --   --  24 26 25   GLUCOSE 125* 97 110*  --   --  87 91 97  BUN 15 10 9   --   --  7* 11 18  CREATININE 0.58 0.30* 0.56  --   --  0.44 0.53 0.69  CALCIUM  9.2  --  9.1  --   --  9.2 9.6 9.4  MG 2.2  --   --  1.9  --  1.9 1.9 2.0  PHOS 2.6  --   --  2.8  --  2.7  --   --    GFR: Estimated Creatinine Clearance: 34.5 mL/min (by C-G formula based on SCr of 0.69 mg/dL). Liver Function Tests: Recent Labs  Lab 01/20/24 1200  AST 23  ALT 14  ALKPHOS 77  BILITOT 1.4*  PROT 6.6  ALBUMIN 3.5   Recent Labs  Lab 01/20/24 1200  LIPASE 42   Recent Labs  Lab 01/21/24 0837  AMMONIA 13   Coagulation Profile: Recent Labs  Lab 01/20/24 1653 01/21/24 0459  INR 1.0 1.0   Cardiac Enzymes: Recent Labs  Lab 01/20/24 1200  CKTOTAL 36*   BNP (last 3 results) No results for input(s): "PROBNP" in the last 8760 hours. HbA1C: No results for input(s): "HGBA1C" in the last 72 hours. CBG: No results for input(s): "GLUCAP" in the last 168 hours. Lipid Profile: No results for input(s): "CHOL", "HDL", "LDLCALC", "TRIG", "CHOLHDL", "LDLDIRECT" in the last 72 hours. Thyroid  Function Tests: No results for input(s): "TSH", "T4TOTAL", "FREET4", "T3FREE", "THYROIDAB" in the last 72 hours. Anemia  Panel: No results for input(s): "VITAMINB12", "FOLATE", "FERRITIN", "TIBC", "IRON", "RETICCTPCT" in the last 72 hours. Sepsis Labs: Recent Labs  Lab 01/20/24 1230  LATICACIDVEN 1.7    Recent Results (from the past 240 hours)  Culture, blood (routine x  2)     Status: None (Preliminary result)   Collection Time: 01/20/24 12:00 PM   Specimen: BLOOD  Result Value Ref Range Status   Specimen Description   Final    BLOOD LEFT ANTECUBITAL Performed at [facility], [address], [place], [place] [address]    Special Requests   Final    BOTTLES DRAWN AEROBIC AND ANAEROBIC Blood Culture adequate volume Performed at [facility], [address], [place], [place] [address]    Culture   Final    NO GROWTH 4 DAYS Performed at [facility], [address] [place], [place] [address]    Report Status PENDING  Incomplete  Urine Culture     Status: Abnormal   Collection Time: 01/20/24  2:50 PM   Specimen: Urine, Random  Result Value Ref Range Status   Specimen Description   Final    URINE, RANDOM Performed at [facility], [address] [place], [place] [address]    Special Requests   Final    NONE Reflexed from [id number] Performed at [facility], [address] [place], [place] [address]    Culture >=100,000 COLONIES/mL KLEBSIELLA PNEUMONIAE (A)  Final   Report Status 01/22/2024 FINAL  Final   Organism ID, Bacteria KLEBSIELLA PNEUMONIAE (A)  Final      Susceptibility   Klebsiella pneumoniae - MIC*    AMPICILLIN >=32 RESISTANT Resistant     CEFAZOLIN  <=4 SENSITIVE Sensitive     CEFEPIME <=0.12 SENSITIVE Sensitive     CEFTRIAXONE  <=0.25 SENSITIVE Sensitive     CIPROFLOXACIN  <=0.25 SENSITIVE Sensitive     GENTAMICIN  <=1 SENSITIVE Sensitive     IMIPENEM 0.5 SENSITIVE Sensitive     NITROFURANTOIN 256 RESISTANT Resistant     TRIMETH/SULFA <=20 SENSITIVE Sensitive     AMPICILLIN/SULBACTAM 16  INTERMEDIATE Intermediate     PIP/TAZO 16 SENSITIVE Sensitive ug/mL    * >=100,000 COLONIES/mL KLEBSIELLA PNEUMONIAE  Culture, blood (routine x 2)     Status: None (Preliminary result)   Collection Time: 01/20/24  4:53 PM   Specimen: BLOOD LEFT ARM  Result Value Ref Range Status   Specimen Description   Final    BLOOD LEFT ARM Performed at [facility], [address], [place], [place] [address]    Special Requests   Final    BOTTLES DRAWN AEROBIC AND ANAEROBIC Blood Culture results may not be optimal due to an inadequate volume of blood received in culture bottles Performed at [facility], [address], [place], [place] [address]    Culture   Final    NO GROWTH 4 DAYS Performed at [facility], [address], [place], [place] [address]    Report Status PENDING  Incomplete         Radiology Studies: No results found.         LOS: 3 days   Time spent= 35 mins    Maggie Schooner, MD [facility]  If 7PM-7AM, please contact night-coverage  01/24/2024, 10:38 AM

## 2024-01-24 NOTE — Plan of Care (Addendum)
 VSS. Patient c/o neck and back pain, given PRN Oxycodone . Patient refused morning lab draw. No acute events overnight.  Problem: Education: Goal: Knowledge of General Education information will improve Description: Including pain rating scale, medication(s)/side effects and non-pharmacologic comfort measures Outcome: Progressing   Problem: Clinical Measurements: Goal: Ability to maintain clinical measurements within normal limits will improve Outcome: Progressing Goal: Will remain free from infection Outcome: Progressing   Problem: Elimination: Goal: Will not experience complications related to urinary retention Outcome: Progressing   Problem: Pain Managment: Goal: General experience of comfort will improve and/or be controlled Outcome: Progressing   Problem: Safety: Goal: Ability to remain free from injury will improve Outcome: Progressing   Problem: Skin Integrity: Goal: Risk for impaired skin integrity will decrease Outcome: Progressing   Problem: Urinary Elimination: Goal: Signs and symptoms of infection will decrease Outcome: Progressing

## 2024-01-25 LAB — CULTURE, BLOOD (ROUTINE X 2)
Culture: NO GROWTH
Culture: NO GROWTH
Special Requests: ADEQUATE

## 2024-01-25 LAB — BASIC METABOLIC PANEL WITH GFR
Anion gap: 9 (ref 5–15)
BUN: 18 mg/dL (ref 8–23)
CO2: 25 mmol/L (ref 22–32)
Calcium: 9.4 mg/dL (ref 8.9–10.3)
Chloride: 102 mmol/L (ref 98–111)
Creatinine, Ser: 0.59 mg/dL (ref 0.44–1.00)
GFR, Estimated: >60 mL/min (ref >60)
Glucose, Bld: 97 mg/dL (ref 70–99)
Potassium: 3.6 mmol/L (ref 3.5–5.1)
Sodium: 136 mmol/L (ref 135–145)

## 2024-01-25 LAB — CBC
HCT: 37.2 % (ref 36.0–46.0)
Hemoglobin: 12 g/dL (ref 12.0–15.0)
MCH: 29.4 pg (ref 26.0–34.0)
MCHC: 32.3 g/dL (ref 30.0–36.0)
MCV: 91.2 fL (ref 80.0–100.0)
Platelets: 239 10*3/uL (ref 150–400)
RBC: 4.08 MIL/uL (ref 3.87–5.11)
RDW: 13.8 % (ref 11.5–15.5)
WBC: 5.6 10*3/uL (ref 4.0–10.5)
nRBC: 0 % (ref 0.0–0.2)

## 2024-01-25 LAB — MAGNESIUM: Magnesium: 1.9 mg/dL (ref 1.7–2.4)

## 2024-01-25 NOTE — Progress Notes (Signed)
 Physical Therapy Treatment Patient Details Name: Elaine Rivera MRN: [id number] DOB: May 22, 1946 Today's Date: 01/25/2024   History of Present Illness Patient is a 78 year old female who presented to ED on 3/28 s/p fall at home.daughter reported that she went over to check on mother and was told that she had a fall.  patient has also been having some persistent diarrhea. PMH: depression, dementia, prior CVA, GI bleeding, iron deficiency anemia, scoliosis.    PT Comments  AxO x 2 sweet Lady but quick to say "I can't".  Required MAX encouragement to participate.  Self limiting.  Lives home with "friend" and per chart review, Pt was not getting OOB, taking her meds or eating well. Assisted with mobility was difficult.  General bed mobility comments: Pt required Max Assist to transition to EOB using bed pad to complete scooting.  Poor sitting posture/balance.  Max c/o weakness/fatigue.  Increased neck and upper back pain.  Sitting tolerance with limited < 2 min.  General transfer comment: assisted with sit to stand EOB with walker at Mod Assist and much effort.  "I can't" repeated Pt.  Unsteady with poor posture and unable to self correct.  "I need to sit back down", stated Pt.  "I'm sorry".  "I can't".  So assisted back to bed supine. Required even greater assistance back to bed, support B LE and scooting to HOB.  Positioned to comfort. Applied 2 small hot pack to neck/shoulders.   Pt will need ST Rehab at SNF to address mobility and functional decline prior to safely returning home.    If plan is discharge home, recommend the following: A lot of help with walking and/or transfers;A lot of help with bathing/dressing/bathroom;Assistance with cooking/housework;Assist for transportation;Help with stairs or ramp for entrance   Can travel by private vehicle     No  Equipment Recommendations  None recommended by PT    Recommendations for Other Services       Precautions / Restrictions  Precautions Precautions: Fall Precaution/Restrictions Comments: scoliosis Restrictions Weight Bearing Restrictions Per Provider Order: No     Mobility  Bed Mobility Overal bed mobility: Needs Assistance Bed Mobility: Supine to Sit, Sit to Supine     Supine to sit: Max assist Sit to supine: Max assist   General bed mobility comments: Pt required Max Assist to transition to EOB using bed pad to complete scooting.  Poor sitting posture/balance.  Max c/o weakness/fatigue.  Increased neck and upper back pain.  Sitting tolerance with limited < 2 min.  Required even greater assistance back to bed, support B LE and scooting to HOB.  Positioned to comfort.    Transfers Overall transfer level: Needs assistance Equipment used: Rolling walker (2 wheels) Transfers: Sit to/from Stand Sit to Stand: Mod assist           General transfer comment: assisted with sit to stand EOB with walker at Mod Assist and much effort.  "I can't" repeated Pt.  Unsteady with poor posture and unable to self correct.  "I need to sit back down", stated Pt.  "I'm sorry".  "I can't".  So assisted back to bed supine.    Ambulation/Gait               General Gait Details: unbale to attempt due to profound weakness and Pt anxiety/fear of falling.   Stairs             Wheelchair Mobility     Tilt Bed    Modified Rankin (  Stroke Patients Only)       Balance                                            Communication    Cognition Arousal: Alert Behavior During Therapy: WFL for tasks assessed/performed   PT - Cognitive impairments: Problem solving, Safety/Judgement                       PT - Cognition Comments: AxO x 2 sweet Lady but quick to say "I can't".  Required MAX encouragement to participate.  Self limiting.  Lives home with "friend" and per chart review, Pt was not getting OOB, taking her meds or eating well.        Cueing Cueing Techniques: Verbal cues   Exercises      General Comments        Pertinent Vitals/Pain Pain Assessment Pain Assessment: Faces Faces Pain Scale: Hurts even more Pain Location: neck/shoulders Pain Descriptors / Indicators: Discomfort, Aching, Constant Pain Intervention(s): Repositioned, Heat applied    Home Living                          Prior Function            PT Goals (current goals can now be found in the care plan section) Progress towards PT goals: Progressing toward goals    Frequency    Min 3X/week      PT Plan      Co-evaluation              AM-PAC PT "6 Clicks" Mobility   Outcome Measure  Help needed turning from your back to your side while in a flat bed without using bedrails?: A Lot Help needed moving from lying on your back to sitting on the side of a flat bed without using bedrails?: A Lot Help needed moving to and from a bed to a chair (including a wheelchair)?: A Lot Help needed standing up from a chair using your arms (e.g., wheelchair or bedside chair)?: A Lot Help needed to walk in hospital room?: Total Help needed climbing 3-5 steps with a railing? : Total 6 Click Score: 10    End of Session Equipment Utilized During Treatment: Gait belt Activity Tolerance: Patient limited by fatigue;Patient limited by pain Patient left: in bed;with bed alarm set;with call bell/phone within reach Nurse Communication: Mobility status PT Visit Diagnosis: Difficulty in walking, not elsewhere classified (R26.2);Muscle weakness (generalized) (M62.81)     Time: 1610-9604 PT Time Calculation (min) (ACUTE ONLY): 16 min  Charges:    $Therapeutic Activity: 8-22 mins PT General Charges $$ ACUTE PT VISIT: 1 Visit                    Bess Broody  PTA Acute  Rehabilitation Services Office M-F          [phone]

## 2024-01-25 NOTE — Plan of Care (Signed)
  Problem: Education: Goal: Knowledge of General Education information will improve Description: Including pain rating scale, medication(s)/side effects and non-pharmacologic comfort measures Outcome: Progressing   Problem: Clinical Measurements: Goal: Ability to maintain clinical measurements within normal limits will improve Outcome: Progressing   Problem: Clinical Measurements: Goal: Will remain free from infection Outcome: Progressing   Problem: Activity: Goal: Risk for activity intolerance will decrease Outcome: Progressing   Problem: Nutrition: Goal: Adequate nutrition will be maintained Outcome: Progressing

## 2024-01-25 NOTE — Progress Notes (Signed)
 PROGRESS NOTE    Elaine Rivera  NWG:[id number] DOB: 07-16-1946 DOA: 01/20/2024 PCP: Alta Jersey, NP    Brief Narrative:  78 year old with history of depression, dementia, prior CVA, GI bleeding, iron deficiency anemia discharged from: On April 1 after being admitted for a fall at home.  Eventually was discharged to rehab and recently returned home.  Apparently patient has been falling at home with increasing confusion therefore brought back to the hospital.  During the hospitalization her mentation improved, extensive workup as mentioned below has been negative.  She was treated for for urinary tract infection as well.  Eventually cultures grew Klebsiella which was pansensitive therefore she was transitioned to Rocephin .  PT/OT recommended SNF.  All the imaging and metabolic workup otherwise has been negative. Awaiting SNF placement , insurance authorization.  Assessment & Plan:  Principal Problem:   AMS (altered mental status) Active Problems:   HTN (hypertension)   Back pain   AMS (altered mental status), improved Fall - Overall progressive decline at home.  Mentation have cleared up now..  Currently his trauma workup including CT head, MRI brain, cervical spine, chest x-ray, pelvic x-ray are all negative. EEG - neg -CK levels are normal - Folate normal.  Elevated TSH and low B12 -PT/OT = SNF  Vitamin B12 deficiency - Supplements started  Hypokalemia/hypocalcemia - Replete as needed  Urinary tract infection - Urine cultures are growing Klebsiella.  Transition antibiotics to Ancef , EOT 4/28  Elevated TSH - Free T4 is normal.  Repeat thyroid  studies outpatient in about 2-4 weeks  Vitamin B12 deficiency - Started B12 supplements   Back pain -Likely chronic from falls   HTN (hypertension)   Depression No suicidal ideation, continue with Lexapro    PT/OT-SNF.  TOC consulted  DVT prophylaxis: Lovenox     Code Status: Full Code Family Communication: Daughter  updated 4/27, she is out of country now but will check in if and when she can.  SNF placement.   Subjective: Patient seen has no new complaints. Medically cleared awaiting Snf placement.  Examination:  General exam: Appears calm and comfortable, elderly frail Respiratory system: Clear to auscultation. Respiratory effort normal. Cardiovascular system: S1 & S2 heard, RRR. No JVD, murmurs, rubs, gallops or clicks. No pedal edema. Gastrointestinal system: Abdomen is nondistended, soft and nontender. No organomegaly or masses felt. Normal bowel sounds heard. Central nervous system: Alert and oriented. No focal neurological deficits. Extremities: Symmetric 5 x 5 power. Skin: No rashes, lesions or ulcers Psychiatry: Judgement and insight appear normal. Mood & affect appropriate.                Diet Orders (From admission, onward)     Start     Ordered   01/20/24 1743  Diet regular Fluid consistency: Thin  Diet effective now       Question:  Fluid consistency:  Answer:  Thin   01/20/24 1742            Objective: Vitals:   01/24/24 1159 01/24/24 1928 01/25/24 0448 01/25/24 0500  BP: 134/84 (!) 102/49 130/70   Pulse: 65 72 61   Resp:  18 16   Temp: 98.7 F (37.1 C) 98 F (36.7 C) 98.1 F (36.7 C)   TempSrc:      SpO2: 96% 96% 100%   Weight:    38.1 kg  Height:        Intake/Output Summary (Last 24 hours) at 01/25/2024 1233 Last data filed at 01/25/2024 0535 Gross per 24  hour  Intake 620 ml  Output 800 ml  Net -180 ml   Filed Weights   01/20/24 1633 01/25/24 0500  Weight: 37.7 kg 38.1 kg    Scheduled Meds:  vitamin B-12  1,000 mcg Oral Daily   enoxaparin  (LOVENOX ) injection  30 mg Subcutaneous Q24H   escitalopram   10 mg Oral Daily   feeding supplement  1 Container Oral TID BM   ferrous sulfate   325 mg Oral Q breakfast   pantoprazole   40 mg Oral Daily   sodium chloride  flush  3 mL Intravenous Q12H   thiamine  (VITAMIN B1) injection  100 mg Intravenous  Daily   Continuous Infusions:    Nutritional status     Body mass index is 16.97 kg/m.  Data Reviewed:   CBC: Recent Labs  Lab 01/20/24 1200 01/20/24 1230 01/21/24 0459 01/22/24 0644 01/23/24 0818 01/24/24 0852 01/25/24 0516  WBC 5.8  --  7.3 5.1 5.0 5.1 5.6  NEUTROABS 4.5  --   --   --   --   --   --   HGB 13.9   < > 12.6 12.9 12.9 12.7 12.0  HCT 42.0   < > 38.7 40.1 39.3 38.9 37.2  MCV 89.4  --  90.4 92.8 91.0 89.8 91.2  PLT 234  --  219 230 237 225 239   < > = values in this interval not displayed.   Basic Metabolic Panel: Recent Labs  Lab 01/20/24 1200 01/20/24 1230 01/21/24 0459 01/21/24 0837 01/21/24 1501 01/22/24 0644 01/23/24 0818 01/24/24 0644 01/25/24 0516  NA 140   < > 135  --   --  139 136 133* 136  K 3.3*   < > 2.5*  --  4.2 3.5 4.0 3.9 3.6  CL 101   < > 99  --   --  105 102 99 102  CO2 28  --  26  --   --  24 26 25 25   GLUCOSE 125*   < > 110*  --   --  87 91 97 97  BUN 15   < > 9  --   --  7* 11 18 18   CREATININE 0.58   < > 0.56  --   --  0.44 0.53 0.69 0.59  CALCIUM  9.2  --  9.1  --   --  9.2 9.6 9.4 9.4  MG 2.2  --   --  1.9  --  1.9 1.9 2.0 1.9  PHOS 2.6  --   --  2.8  --  2.7  --   --   --    < > = values in this interval not displayed.   GFR: Estimated Creatinine Clearance: 34.9 mL/min (by C-G formula based on SCr of 0.59 mg/dL). Liver Function Tests: Recent Labs  Lab 01/20/24 1200  AST 23  ALT 14  ALKPHOS 77  BILITOT 1.4*  PROT 6.6  ALBUMIN 3.5   Recent Labs  Lab 01/20/24 1200  LIPASE 42   Recent Labs  Lab 01/21/24 0837  AMMONIA 13   Coagulation Profile: Recent Labs  Lab 01/20/24 1653 01/21/24 0459  INR 1.0 1.0   Cardiac Enzymes: Recent Labs  Lab 01/20/24 1200  CKTOTAL 36*   BNP (last 3 results) No results for input(s): "PROBNP" in the last 8760 hours. HbA1C: No results for input(s): "HGBA1C" in the last 72 hours. CBG: No results for input(s): "GLUCAP" in the last 168 hours. Lipid Profile: No  results for input(s): "  CHOL", "HDL", "LDLCALC", "TRIG", "CHOLHDL", "LDLDIRECT" in the last 72 hours. Thyroid  Function Tests: No results for input(s): "TSH", "T4TOTAL", "FREET4", "T3FREE", "THYROIDAB" in the last 72 hours. Anemia Panel: No results for input(s): "VITAMINB12", "FOLATE", "FERRITIN", "TIBC", "IRON", "RETICCTPCT" in the last 72 hours. Sepsis Labs: Recent Labs  Lab 01/20/24 1230  LATICACIDVEN 1.7    Recent Results (from the past 240 hours)  Culture, blood (routine x 2)     Status: None   Collection Time: 01/20/24 12:00 PM   Specimen: BLOOD  Result Value Ref Range Status   Specimen Description   Final    BLOOD LEFT ANTECUBITAL Performed at [facility], [address] [place], [place] [address]    Special Requests   Final    BOTTLES DRAWN AEROBIC AND ANAEROBIC Blood Culture adequate volume Performed at [facility], [address], [place], [place] [address]    Culture   Final    NO GROWTH 5 DAYS Performed at [facility], [address] [place], [place] [address]    Report Status 01/25/2024 FINAL  Final  Urine Culture     Status: Abnormal   Collection Time: 01/20/24  2:50 PM   Specimen: Urine, Random  Result Value Ref Range Status   Specimen Description   Final    URINE, RANDOM Performed at [facility], [address] [place], [place] [address]    Special Requests   Final    NONE Reflexed from [id number] Performed at [facility], [address], [place], [place] [address]    Culture >=100,000 COLONIES/mL KLEBSIELLA PNEUMONIAE (A)  Final   Report Status 01/22/2024 FINAL  Final   Organism ID, Bacteria KLEBSIELLA PNEUMONIAE (A)  Final      Susceptibility   Klebsiella pneumoniae - MIC*    AMPICILLIN >=32 RESISTANT Resistant     CEFAZOLIN  <=4 SENSITIVE Sensitive     CEFEPIME <=0.12 SENSITIVE Sensitive     CEFTRIAXONE  <=0.25 SENSITIVE Sensitive     CIPROFLOXACIN  <=0.25 SENSITIVE  Sensitive     GENTAMICIN  <=1 SENSITIVE Sensitive     IMIPENEM 0.5 SENSITIVE Sensitive     NITROFURANTOIN 256 RESISTANT Resistant     TRIMETH/SULFA <=20 SENSITIVE Sensitive     AMPICILLIN/SULBACTAM 16 INTERMEDIATE Intermediate     PIP/TAZO 16 SENSITIVE Sensitive ug/mL    * >=100,000 COLONIES/mL KLEBSIELLA PNEUMONIAE  Culture, blood (routine x 2)     Status: None   Collection Time: 01/20/24  4:53 PM   Specimen: BLOOD LEFT ARM  Result Value Ref Range Status   Specimen Description   Final    BLOOD LEFT ARM Performed at [facility], [address], [place], [place] [address]    Special Requests   Final    BOTTLES DRAWN AEROBIC AND ANAEROBIC Blood Culture results may not be optimal due to an inadequate volume of blood received in culture bottles Performed at [facility], [address], [place], [place] [address]    Culture   Final    NO GROWTH 5 DAYS Performed at [facility], [address] [place], [place] [address]    Report Status 01/25/2024 FINAL  Final         Radiology Studies: No results found.         LOS: 4 days   Time spent= 35 mins    Edwena Graham, MD [facility]  If 7PM-7AM, please contact night-coverage  01/25/2024, 12:33 PM

## 2024-01-25 NOTE — TOC Progression Note (Signed)
 Transition of Care [facility]) - Progression Note    Patient Details  Name: Elaine Rivera MRN: [id number] Date of Birth: 1946/08/26  Transition of [facility]) CM/SW Contact  Tessie Fila, RN Phone Number: 01/25/2024, 3:31 PM  Clinical Narrative:    Insurance [employer] has been requested and is currently pending.   Expected Discharge Plan: Skilled Nursing Facility Barriers to Discharge: Continued Medical Work up  Expected Discharge Plan and Services In-house Referral: Clinical Social Work Discharge Planning Services: NA Post Acute Care Choice: Nursing Home, Skilled Nursing Facility Living arrangements for the past 2 months: Apartment                 DME Arranged: N/A DME Agency: NA                   Social Determinants of Health (SDOH) Interventions SDOH Screenings   Food Insecurity: No Food Insecurity (01/20/2024)  Housing: Low Risk  (01/20/2024)  Transportation Needs: No Transportation Needs (01/20/2024)  Utilities: Not At Risk (01/20/2024)  Social Connections: Socially Isolated (01/20/2024)  Tobacco Use: Medium Risk (01/20/2024)    Readmission Risk Interventions    11/13/2021    1:56 PM  Readmission Risk Prevention Plan  Transportation Screening Complete  PCP or Specialist Appt within 5-7 Days Complete  Home Care Screening Complete  Medication Review (RN CM) Complete

## 2024-01-25 NOTE — TOC Progression Note (Addendum)
 Transition of Care [facility]) - Progression Note    Patient Details  Name: Elaine Rivera MRN: [id number] Date of Birth: Jul 31, 1946  Transition of Care [facility]) CM/SW Contact  Tessie Fila, RN Phone Number: 01/25/2024, 10:52 AM  Clinical Narrative:    Called the patient's daughter to go over bed offers. Pt daughter wants to accept the bed at [facility]. Notified [place] at [employer]. Will start auth once PT puts in updated note.   [facility]                           2 stars  [facility] Preferred SNF 2 stars  [facility] Preferred SN[facility]                                                                    2 stars  [facility]                        2 stars     [facility] Preferred SN[facility]                                              1 star      [facility] Preferred SNF    3 stars    Expected Discharge Plan: Skilled Nursing Facility Barriers to Discharge: Continued Medical Work up  Expected Discharge Plan and Services In-house Referral: Clinical Social Work Discharge Planning Services: NA Post Acute Care Choice: Nursing Home, Skilled Nursing Facility Living arrangements for the past 2 months: Apartment                 DME Arranged: N/A DME Agency: NA                   Social Determinants of Health (SDOH) Interventions SDOH Screenings   Food Insecurity: No Food Insecurity (01/20/2024)  Housing: Low Risk  (01/20/2024)  Transportation Needs: No Transportation Needs (01/20/2024)  Utilities: Not At Risk (01/20/2024)  Social Connections: Socially Isolated (01/20/2024)  Tobacco Use: Medium Risk (01/20/2024)    Readmission Risk Interventions    11/13/2021    1:56 PM  Readmission Risk Prevention Plan  Transportation Screening Complete  PCP or Specialist Appt within 5-7 Days Complete  Home Care Screening Complete  Medication Review (RN CM) Complete

## 2024-01-26 DIAGNOSIS — R4182 Altered mental status, unspecified: Secondary | ICD-10-CM | POA: Diagnosis not present

## 2024-01-26 LAB — CBC
HCT: 40.4 % (ref 36.0–46.0)
Hemoglobin: 12.4 g/dL (ref 12.0–15.0)
MCH: 29.2 pg (ref 26.0–34.0)
MCHC: 30.7 g/dL (ref 30.0–36.0)
MCV: 95.1 fL (ref 80.0–100.0)
Platelets: 247 10*3/uL (ref 150–400)
RBC: 4.25 MIL/uL (ref 3.87–5.11)
RDW: 13.9 % (ref 11.5–15.5)
WBC: 4.8 10*3/uL (ref 4.0–10.5)
nRBC: 0 % (ref 0.0–0.2)

## 2024-01-26 LAB — BASIC METABOLIC PANEL WITH GFR
Anion gap: 8 (ref 5–15)
BUN: 14 mg/dL (ref 8–23)
CO2: 26 mmol/L (ref 22–32)
Calcium: 9.4 mg/dL (ref 8.9–10.3)
Chloride: 101 mmol/L (ref 98–111)
Creatinine, Ser: 0.62 mg/dL (ref 0.44–1.00)
GFR, Estimated: >60 mL/min (ref >60)
Glucose, Bld: 103 mg/dL — ABNORMAL HIGH (ref 70–99)
Potassium: 3.3 mmol/L — ABNORMAL LOW (ref 3.5–5.1)
Sodium: 135 mmol/L (ref 135–145)

## 2024-01-26 LAB — MAGNESIUM: Magnesium: 2 mg/dL (ref 1.7–2.4)

## 2024-01-26 NOTE — Progress Notes (Signed)
 Report attempted x2

## 2024-01-26 NOTE — Progress Notes (Signed)
 Occupational Therapy Treatment Patient Details Name: Elaine Rivera MRN: [id number] DOB: 1946-08-14 Today's Date: 01/26/2024   History of present illness Patient is a 78 year old female who presented to ED on 3/28 s/p fall at home. Daughter reported that she went over to check on mother and was told that she had a fall.  Patient has also been having some persistent diarrhea. PMH: depression, dementia, prior CVA, GI bleeding, iron deficiency anemia, scoliosis.   OT comments  The pt required gentle reinforcement and encouragement to participate in the session. She was seen for ADL instruction and participation at bed level, as she declined to attempt out of bed activity. She reported being soiled of bowel in bed, subsequently requiring max assist overall to manage. She further performed upper body grooming with set-up/supervision & lower body dressing with max assist. She reported having chronic neck and upper back pain. Continue OT plan of care. Patient will benefit from continued inpatient follow up therapy, <3 hours/day.       If plan is discharge home, recommend the following:  A lot of help with walking and/or transfers;A lot of help with bathing/dressing/bathroom;Direct supervision/assist for medications management;Direct supervision/assist for financial management   Equipment Recommendations  Other (comment) (defer to next level of care)    Recommendations for Other Services      Precautions / Restrictions Precautions Precautions: Fall Restrictions Weight Bearing Restrictions Per Provider Order: No       Mobility Bed Mobility Overal bed mobility: Needs Assistance Bed Mobility: Rolling Rolling: Min assist, Used rails              Transfers        General transfer comment: she declined to attempt out of bed transfers         ADL either performed or assessed with clinical judgement   ADL       Grooming: Set up;Wash/dry hands;Bed level Grooming Details  (indicate cue type and reason): She performed hand washing, face washing, and teeth brushing in the semi-fowler's position.             Lower Body Dressing: Maximal assistance;Bed level Lower Body Dressing Details (indicate cue type and reason): Pt was instructed on rolling and bridging in bed, to help with doffing and donning underwear in bed.     Toileting- Clothing Manipulation and Hygiene: Maximal assistance;Bed level Toileting - Clothing Manipulation Details (indicate cue type and reason): Pt reported being soiled while laying in bed. She declined to attempt out of bed activity, therefore assist for toileting management was provided in bed. She was able to assist with posterior peri-hygiene while side-lying in bed, however she needed assistance for thoroughness. He required min assist for performing multiple rolls left and right, in order to complete toileting management, as welll as assist for clothing management.             Communication Communication Communication: No apparent difficulties   Cognition Arousal: Alert   Cognition: History of cognitive impairments                         Pertinent Vitals/ Pain       Pain Assessment Pain Assessment: 0-10 Pain Score: 4  Pain Location: neck/shoulders Pain Intervention(s): Patient requesting pain meds-RN notified, Monitored during session, Limited activity within patient's tolerance   Frequency  Min 2X/week        Progress Toward Goals  OT Goals(current goals can now be found in the care  plan section)     Acute Rehab OT Goals Patient Stated Goal: she did not specifically state OT Goal Formulation: With patient Time For Goal Achievement: 02/04/24 Potential to Achieve Goals: Good  Plan         AM-PAC OT "6 Clicks" Daily Activity     Outcome Measure   Help from another person eating meals?: A Little Help from another person taking care of personal grooming?: A Little Help from another person  toileting, which includes using toliet, bedpan, or urinal?: A Lot Help from another person bathing (including washing, rinsing, drying)?: A Lot Help from another person to put on and taking off regular upper body clothing?: A Little Help from another person to put on and taking off regular lower body clothing?: A Lot 6 Click Score: 15    End of Session Equipment Utilized During Treatment: Other (comment) (N/A)  OT Visit Diagnosis: History of falling (Z91.81);Muscle weakness (generalized) (M62.81);Unsteadiness on feet (R26.81)   Activity Tolerance Other (comment) (Fair tolerance)   Patient Left in bed;with call bell/phone within reach;with bed alarm set   Nurse Communication Patient requests pain meds        Time: 0454-0981 OT Time Calculation (min): 25 min  Charges: OT General Charges $OT Visit: 1 Visit OT Treatments $Self Care/Home Management : 23-37 mins     Sheralyn Dies, OTR/L 01/26/2024, 3:28 PM

## 2024-01-26 NOTE — Discharge Summary (Signed)
 Physician Discharge Summary   Patient: Elaine Rivera MRN: [id number] DOB: [birth year]  Admit date:     01/20/2024  Discharge date: 01/26/24  Discharge Physician: Elaine Rivera   PCP: Elaine Jersey, NP   Recommendations at discharge:   Follow-up with PCP in 1 week after discharge.  Discharge Diagnoses: Principal Problem:   AMS (altered mental status) Active Problems:   HTN (hypertension)   Hypokalemia   Back pain   Hospital Course: Brief Narrative:  78 year old with history of depression, dementia, prior CVA, GI bleeding, iron deficiency anemia discharged from: On April 1 after being admitted for a fall at home.  Eventually was discharged to rehab and recently returned home.  Apparently patient has been falling at home with increasing confusion therefore brought back to the hospital.  During the hospitalization her mentation improved, extensive workup as mentioned below has been negative.  She was treated for for urinary tract infection as well.  Eventually cultures grew Elaine Rivera which was pansensitive therefore she was transitioned to Elaine Rivera  and completed course.  PT/OT recommended SNF.  All the imaging and metabolic workup otherwise has been negative. Is currently being discharged to skilled nursing facility today post discharge patient will need to follow-up with PCP in 1 week. Assessment & Plan:  Principal Problem:   AMS (altered mental status) Active Problems:   HTN (hypertension)   Back pain   AMS (altered mental status), improved Fall - Overall progressive decline at home.  Mentation have cleared up now..  Currently his trauma workup including CT head, MRI brain, cervical spine, chest x-ray, pelvic x-ray are all negative. EEG - neg -CK levels are normal - Folate normal.  Elevated TSH and low B12 -PT/OT = SNF  Vitamin B12 deficiency - Supplements started  Hypokalemia/hypocalcemia - Replete as needed  Urinary tract infection - Urine cultures are  growing Elaine Rivera.  Transition antibiotics to Elaine Rivera , EOT 4/28  Elevated TSH - Free T4 is normal.  Repeat thyroid  studies outpatient in about 2-4 weeks  Vitamin B12 deficiency - Started B12 supplements   Back pain -Likely chronic from falls   HTN (hypertension)   Depression No suicidal ideation, continue with Lexapro    PT/OT-SNF.  TOC consulted  DVT prophylaxis: Lovenox     Code Status: Full Code Family Communication: Daughter updated 4/27, [place] is [place] now but will check in if and when she can.  SNF placement.   Subjective: Doing well no complaints  Examination:  General exam: Appears calm and comfortable, elderly frail Respiratory system: Clear to auscultation. Respiratory effort normal. Cardiovascular system: S1 & S2 heard, RRR. No JVD, murmurs, rubs, gallops or clicks. No pedal edema. Gastrointestinal system: Abdomen is nondistended, soft and nontender. No organomegaly or masses felt. Normal bowel sounds heard. Central nervous system: Alert and oriented. No focal neurological deficits. Extremities: Symmetric 5 x 5 power. Skin: No rashes, lesions or ulcers Psychiatry: Judgement and insight appear normal. Mood & affect appropriate.          Consultants: None Procedures performed: None Disposition: Skilled nursing facility Diet recommendation:  Discharge Diet Orders (From admission, onward)     Start     Ordered   01/26/24 0000  Diet general        01/26/24 1251           Regular diet DISCHARGE MEDICATION: Allergies as of 01/26/2024       Reactions   Sulfa Antibiotics Other (See Comments)   Reaction:  Unknown    Tramadol  Other (  See Comments)   seizures        Medication List     STOP taking these medications    cephALEXin  250 MG capsule Commonly known as: KEFLEX        TAKE these medications    acetaminophen  500 MG tablet Commonly known as: TYLENOL  Take 500-1,000 mg by mouth every 6 (six) hours as needed for moderate pain  or mild pain.   diphenoxylate-atropine 2.5-0.025 MG tablet Commonly known as: LOMOTIL Take 1 tablet by mouth 4 (four) times daily as needed for diarrhea or loose stools.   escitalopram  10 MG tablet Commonly known as: LEXAPRO  Take 10 mg by mouth daily.   feeding supplement Liqd Take 237 mLs by mouth 2 (two) times daily between meals.   ferrous sulfate  325 (65 FE) MG tablet Take 1 tablet (325 mg total) by mouth daily with breakfast.   oxyCODONE  5 MG immediate release tablet Commonly known as: Oxy IR/ROXICODONE  Take 1 tablet (5 mg total) by mouth every 6 (six) hours as needed for moderate pain (pain score 4-6) or severe pain (pain score 7-10).   pantoprazole  40 MG tablet Commonly known as: PROTONIX  Take 1 tablet (40 mg total) by mouth daily.   polyethylene glycol 17 g packet Commonly known as: MIRALAX  / GLYCOLAX  Take 17 g by mouth daily as needed for mild constipation.   promethazine -dextromethorphan 6.25-15 MG/5ML syrup Commonly known as: PROMETHAZINE -DM Take 5 mLs by mouth every 4 (four) hours as needed.   Salonpas 3.10-03-08 % Ptch Generic drug: Camphor-Menthol-Methyl Sal Apply 1 patch topically 2 (two) times daily as needed (For pain).   senna-docusate 8.6-50 MG tablet Commonly known as: Senokot-S Take 1 tablet by mouth at bedtime as needed for moderate constipation.        Contact information for follow-up providers     Elaine Rivera, Elaine Conch, NP Follow up in 1 week(s).   Specialty: Nurse Practitioner Contact information: [address] [place] [address] [phone]              Contact information for after-discharge care     Destination     [facility] .   Service: Skilled Nursing Contact information: [address] [place]  [address] [phone]                    Discharge Exam: Elaine Rivera Weights   01/20/24 1633 01/25/24 0500  Weight: 37.7 kg 38.1 kg   Elaine Rivera oriented not in any  distress CVS: S1-S2 positive Respiratory: Bilaterally clear and equal breath sounds.  Condition at discharge: fair  The results of significant diagnostics from this hospitalization (including imaging, microbiology, ancillary and laboratory) are listed below for reference.   Imaging Studies: EEG adult Result Date: 01/21/2024 Arleene Lack, MD     01/21/2024  5:02 PM Patient Name: CHENDA ARTUS MRN: [id number] Epilepsy Attending: Arleene Lack Referring Physician/Provider: Bennie Brave, MD Date: 01/21/2024 Duration: 24.11 mins Patient history: 78yo F with ams. EEG to evaluate for seizure Level of alertness: Awake AEDs during EEG study: None Technical aspects: This EEG study was done with scalp electrodes positioned according to the 10-20 International system of electrode placement. Electrical activity was reviewed with band pass filter of 1-70Hz , sensitivity of 7 uV/mm, display speed of 69mm/sec with a 60Hz  notched filter applied as appropriate. EEG data were recorded continuously and digitally stored.  Video monitoring was available and reviewed as appropriate. Description: The posterior dominant rhythm consists of 8-9Hz  activity of moderate voltage (25-35  uV) seen predominantly in posterior head regions, symmetric and reactive to eye opening and eye closing. Physiologic photic driving was not seen during photic stimulation.  Hyperventilation was not performed.   IMPRESSION: This study is within normal limits. No seizures or epileptiform discharges were seen throughout the recording. A normal interictal EEG does not exclude the diagnosis of epilepsy. Arleene Lack   MR BRAIN WO CONTRAST Result Date: 01/20/2024 CLINICAL DATA:  Stroke suspected EXAM: MRI HEAD WITHOUT CONTRAST TECHNIQUE: Multiplanar, multiecho pulse sequences of the brain and surrounding structures were obtained without intravenous contrast. COMPARISON:  10/26/2021 FINDINGS: Brain: No acute infarct, mass effect or extra-axial  collection. Chronic microhemorrhage in the left hemisphere deep white matter. There is multifocal hyperintense T2-weighted signal within the white matter. Generalized volume loss. The midline structures are normal. 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 Skull and upper cervical spine: Normal calvarium and skull base. Visualized upper cervical spine and soft tissues are normal. Sinuses/Orbits:Left maxillary retention cyst.  Normal orbits. IMPRESSION: 1. No acute intracranial abnormality. 2. 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 3. Atrophy and chronic microvascular ischemia. Electronically Signed   By: Juanetta Nordmann M.D.   On: 01/20/2024 23:15   CT CERVICAL SPINE WO CONTRAST Result Date: 01/20/2024 CLINICAL DATA:  Fall. EXAM: CT CERVICAL SPINE WITHOUT CONTRAST TECHNIQUE: Multidetector CT imaging of the cervical spine was performed without intravenous contrast. Multiplanar CT image reconstructions were also generated. RADIATION DOSE REDUCTION: This exam was performed according to the departmental dose-optimization program which includes automated exposure control, adjustment of the mA and/or kV according to patient size and/or use of iterative reconstruction technique. COMPARISON:  Cervical spine x-ray same day. Cervical spine CT 12/24/2023 plain skin FINDINGS: Alignment: There is 2 mm of anterolisthesis at C5-C6 similar to prior. Alignment is otherwise anatomic. Skull base and vertebrae: Diffuse osteopenia is present. No acute fracture or focal osseous lesion. Soft tissues and spinal canal: No prevertebral fluid or swelling. No visible canal hematoma. Disc levels: There is disc space narrowing throughout the cervical spine with endplate osteophyte formation most significant at C4-C5, C5-C6 and C6-C7. There is no severe central canal neural foraminal stenosis at any level. Upper chest: Not imaged. Other: None.  IMPRESSION: 1. No acute fracture or traumatic subluxation of the cervical spine. 2. Multilevel degenerative changes. Electronically Signed   By: Tyron Gallon M.D.   On: 01/20/2024 23:02   DG Cervical Spine 2-3Vclearing Result Date: 01/20/2024 CLINICAL DATA:  409811 Pain [id number] EXAM: LIMITED CERVICAL SPINE FOR TRAUMA CLEARING - 2-3 VIEW COMPARISON:  CT C-spine 05/25/2022 FINDINGS: On the lateral view the cervical spine is visualized to the level of C6. Partially visualized extra scoliosis of the midthoracic spine. Mild levocurvature of the upper thoracic spine with mild dextrocurvature of the mid cervical spine. Normal cervical lordosis. Alignment is otherwise normal. Dens is well positioned between the lateral masses of C1. There is limited evaluation of the dens for acute fracture on the open-mouth view due to overlying osseous structures. Multilevel moderate degenerative changes of the spine. No acute displaced fracture is detected.No aggressive-appearing focal osseous lesions. Pre-vertebral soft tissues are within normal limits. Atherosclerotic plaque. Vascular clips and vascular stent overlie the neck. IMPRESSION: 1. Negative clearing view of cervical spine. Limited evaluation due to overlapping osseous structures and overlying soft tissues. 2.  Aortic Atherosclerosis (ICD10-I70.0). Electronically Signed   By: Morgane  Naveau M.D.   On: 01/20/2024  19:48   DG Thoracic Spine 2 View Result Date: 01/20/2024 CLINICAL DATA:  Pain after fall EXAM: THORACIC SPINE 2 VIEWS COMPARISON:  CT 12/24/2023 FINDINGS: Marked dextroscoliosis. Vertebral body heights are grossly maintained. Multilevel degenerative osteophytes. Subacute to chronic right sixth rib fracture IMPRESSION: Marked dextroscoliosis with multilevel degenerative change. No definite acute osseous abnormality Electronically Signed   By: Esmeralda Hedge M.D.   On: 01/20/2024 19:47   CT Head Wo Contrast Result Date: 01/20/2024 CLINICAL DATA:  Altered mental  status. EXAM: CT HEAD WITHOUT CONTRAST TECHNIQUE: Contiguous axial images were obtained from the base of the skull through the vertex without intravenous contrast. RADIATION DOSE REDUCTION: This exam was performed according to the departmental dose-optimization program which includes automated exposure control, adjustment of the mA and/or kV according to patient size and/or use of iterative reconstruction technique. COMPARISON:  Head CT dated 12/24/2023. FINDINGS: Brain: Moderate age-related atrophy and chronic microvascular ischemic changes. There is no acute intracranial hemorrhage. Similar appearance of high density, partially calcified mass along the inferior right CP angle. No midline shift. No extra-axial fluid collection. Vascular: Right vertebral artery stent. Skull: No acute calvarial pathology. Right sub occipital craniectomy. Sinuses/Orbits: No acute finding. Other: None IMPRESSION: 1. No acute intracranial pathology. 2. Moderate age-related atrophy and chronic microvascular ischemic changes. 3. Similar appearance of mass along the inferior right CP angle. Electronically Signed   By: Angus Bark M.D.   On: 01/20/2024 14:22   DG Pelvis Portable Result Date: 01/20/2024 CLINICAL DATA:  Weakness EXAM: PORTABLE PELVIS 1-2 VIEWS COMPARISON:  None Available. FINDINGS: There is no evidence of pelvic fracture or diastasis. No pelvic bone lesions are seen. IMPRESSION: Negative. Electronically Signed   By: Fredrich Jefferson M.D.   On: 01/20/2024 14:04   DG Chest Portable 1 View Result Date: 01/20/2024 CLINICAL DATA:  Chest pain EXAM: PORTABLE CHEST 1 VIEW COMPARISON:  December 24, 2023 FINDINGS: The heart size and mediastinal contours are within normal limits. Both lungs are clear. The visualized skeletal structures are unremarkable. Severe dextro rotoscoliosis thoracic spine. IMPRESSION: No active disease. Electronically Signed   By: Fredrich Jefferson M.D.   On: 01/20/2024 14:00    Microbiology: Results for  orders placed or performed during the hospital encounter of 01/20/24  Culture, blood (routine x 2)     Status: None   Collection Time: 01/20/24 12:00 PM   Specimen: BLOOD  Result Value Ref Range Status   Specimen Description   Final    BLOOD LEFT ANTECUBITAL Performed at [facility], [address], [place], [place] [address]    Special Requests   Final    BOTTLES DRAWN AEROBIC AND ANAEROBIC Blood Culture adequate volume Performed at [facility], [address] [place], [place] [address]    Culture   Final    NO GROWTH 5 DAYS Performed at [facility], [address], [place], [place] [address]    Report Status 01/25/2024 FINAL  Final  Urine Culture     Status: Abnormal   Collection Time: 01/20/24  2:50 PM   Specimen: Urine, Random  Result Value Ref Range Status   Specimen Description   Final    URINE, RANDOM Performed at [facility], [address], [place], [place] [address]    Special Requests   Final    NONE Reflexed from [id number] Performed at [facility], [address] [place], [place] [address]    Culture >=100,000 COLONIES/mL Elaine Rivera PNEUMONIAE (A)  Final   Report Status 01/22/2024 FINAL  Final   Organism ID, Bacteria Elaine Rivera PNEUMONIAE (A)  Final      Susceptibility   Elaine Rivera pneumoniae - MIC*    AMPICILLIN >=32 RESISTANT Resistant     CEFAZOLIN  <=4 SENSITIVE Sensitive     CEFEPIME <=0.12 SENSITIVE Sensitive     CEFTRIAXONE  <=0.25 SENSITIVE Sensitive     CIPROFLOXACIN  <=0.25 SENSITIVE Sensitive     GENTAMICIN  <=1 SENSITIVE Sensitive     IMIPENEM 0.5 SENSITIVE Sensitive     NITROFURANTOIN 256 RESISTANT Resistant     TRIMETH/SULFA <=20 SENSITIVE Sensitive     AMPICILLIN/SULBACTAM 16 INTERMEDIATE Intermediate     PIP/TAZO 16 SENSITIVE Sensitive ug/mL    * >=100,000 COLONIES/mL Elaine Rivera PNEUMONIAE  Culture, blood (routine x 2)     Status: None   Collection Time:  01/20/24  4:53 PM   Specimen: BLOOD LEFT ARM  Result Value Ref Range Status   Specimen Description   Final    BLOOD LEFT ARM Performed at [facility], [address] [place], [place] [address]    Special Requests   Final    BOTTLES DRAWN AEROBIC AND ANAEROBIC Blood Culture results may not be optimal due to an inadequate volume of blood received in culture bottles Performed at [facility], [address] [place], [place] [address]    Culture   Final    NO GROWTH 5 DAYS Performed at [facility], [address] [place], [place] [address]    Report Status 01/25/2024 FINAL  Final    Labs: CBC: Recent Labs  Lab 01/20/24 1200 01/20/24 1230 01/22/24 0644 01/23/24 0818 01/24/24 0852 01/25/24 0516 01/26/24 0602  WBC 5.8   < > 5.1 5.0 5.1 5.6 4.8  NEUTROABS 4.5  --   --   --   --   --   --   HGB 13.9   < > 12.9 12.9 12.7 12.0 12.4  HCT 42.0   < > 40.1 39.3 38.9 37.2 40.4  MCV 89.4   < > 92.8 91.0 89.8 91.2 95.1  PLT 234   < > 230 237 225 239 247   < > = values in this interval not displayed.   Basic Metabolic Panel: Recent Labs  Lab 01/20/24 1200 01/20/24 1230 01/21/24 0837 01/21/24 1501 01/22/24 0644 01/23/24 0818 01/24/24 0644 01/25/24 0516 01/26/24 0602  NA 140   < >  --   --  139 136 133* 136 135  K 3.3*   < >  --    < > 3.5 4.0 3.9 3.6 3.3*  CL 101   < >  --   --  105 102 99 102 101  CO2 28   < >  --   --  24 26 25 25 26   GLUCOSE 125*   < >  --   --  87 91 97 97 103*  BUN 15   < >  --   --  7* 11 18 18 14   CREATININE 0.58   < >  --   --  0.44 0.53 0.69 0.59 0.62  CALCIUM  9.2   < >  --   --  9.2 9.6 9.4 9.4 9.4  MG 2.2  --  1.9  --  1.9 1.9 2.0 1.9 2.0  PHOS 2.6  --  2.8  --  2.7  --   --   --   --    < > = values  in this interval not displayed.   Liver Function Tests: Recent Labs  Lab 01/20/24 1200  AST 23  ALT 14  ALKPHOS 77  BILITOT 1.4*  PROT 6.6  ALBUMIN 3.5   CBG: No results for input(s): "GLUCAP" in the last  168 hours.  Discharge time spent: greater than 30 minutes.  Signed: Edwena Graham, MD [facility] 01/26/2024

## 2024-01-26 NOTE — TOC Transition Note (Addendum)
 Transition of Care [facility]) - Discharge Note   Patient Details  Name: Elaine Rivera MRN: [id number] Date of Birth: [birth year]  [facility]) CM/SW Contact:  Tessie Fila, RN Phone Number: 01/26/2024, 2:13 PM   Clinical Narrative:    Pt agreeable with discharging to [facility]. Made aware that she would be leaving by ambulance. Pt daughter notified of discharge plans and is agreeable. Made daughter aware the pt has 8 full days left that insurance will cover at SNF and after day 8 a $50 a day copay will be due. Daughter voices understanding. Discharge packet at RN station with signed script. RN notified of Report to call is [phone] for room 610B. [facility] called for transport.   Final next level of care: Skilled Nursing Facility Barriers to Discharge: No Barriers Identified   Patient Goals and CMS Choice Patient states their goals for this hospitalization and ongoing recovery are:: To get back to baseline to go to [facility] CMS Medicare.gov Compare Post Acute Care list provided to:: Patient Choice offered to / list presented to : Patient [place] ownership interest in [facility].provided to:: Patient    Discharge Placement              Patient chooses bed at: [facility] Patient to be transferred to facility by: [facility] Name of family member notified: Analayah, Lamkins (Daughter)  [phone] Patient and family notified of of transfer: 01/26/24  Discharge Plan and Services Additional resources added to the After Visit Summary for   In-house Referral: Clinical Social Work Discharge Planning Services: NA Post Acute Care Choice: Nursing Home, Skilled Nursing Facility          DME Arranged: N/A DME Agency: NA       HH Arranged: NA HH Agency: NA        Social Drivers of Health (SDOH) Interventions SDOH Screenings   Food Insecurity: No Food Insecurity (01/20/2024)  Housing: Low Risk  (01/20/2024)  Transportation Needs: No Transportation Needs  (01/20/2024)  Utilities: Not At Risk (01/20/2024)  Social Connections: Socially Isolated (01/20/2024)  Tobacco Use: Medium Risk (01/20/2024)     Readmission Risk Interventions    11/13/2021    1:56 PM  Readmission Risk Prevention Plan  Transportation Screening Complete  PCP or Specialist Appt within 5-7 Days Complete  Home Care Screening Complete  Medication Review (RN CM) Complete

## 2024-01-27 DIAGNOSIS — M545 Low back pain, unspecified: Secondary | ICD-10-CM | POA: Diagnosis not present

## 2024-01-27 DIAGNOSIS — I10 Essential (primary) hypertension: Secondary | ICD-10-CM | POA: Diagnosis not present

## 2024-01-27 DIAGNOSIS — E039 Hypothyroidism, unspecified: Secondary | ICD-10-CM | POA: Diagnosis not present

## 2024-01-27 DIAGNOSIS — F0390 Unspecified dementia without behavioral disturbance: Secondary | ICD-10-CM | POA: Diagnosis not present

## 2024-03-25 ENCOUNTER — Encounter (HOSPITAL_COMMUNITY): Payer: Self-pay | Admitting: Interventional Radiology

## 2024-04-12 ENCOUNTER — Other Ambulatory Visit: Payer: Self-pay

## 2024-04-12 ENCOUNTER — Emergency Department (HOSPITAL_COMMUNITY)

## 2024-04-12 ENCOUNTER — Observation Stay (HOSPITAL_COMMUNITY)
Admission: EM | Admit: 2024-04-12 | Discharge: 2024-04-16 | Disposition: A | Discharge status: Skilled Nursing Facility | Attending: Family Medicine | Admitting: Family Medicine

## 2024-04-12 ENCOUNTER — Encounter (HOSPITAL_COMMUNITY): Payer: Self-pay

## 2024-04-12 DIAGNOSIS — K219 Gastro-esophageal reflux disease without esophagitis: Secondary | ICD-10-CM | POA: Diagnosis present

## 2024-04-12 DIAGNOSIS — R4182 Altered mental status, unspecified: Secondary | ICD-10-CM | POA: Diagnosis not present

## 2024-04-12 DIAGNOSIS — E876 Hypokalemia: Secondary | ICD-10-CM | POA: Diagnosis not present

## 2024-04-12 DIAGNOSIS — G8929 Other chronic pain: Secondary | ICD-10-CM | POA: Diagnosis not present

## 2024-04-12 DIAGNOSIS — D649 Anemia, unspecified: Secondary | ICD-10-CM | POA: Diagnosis not present

## 2024-04-12 DIAGNOSIS — Z8759 Personal history of other complications of pregnancy, childbirth and the puerperium: Secondary | ICD-10-CM | POA: Diagnosis not present

## 2024-04-12 DIAGNOSIS — R5381 Other malaise: Secondary | ICD-10-CM | POA: Insufficient documentation

## 2024-04-12 DIAGNOSIS — Z79899 Other long term (current) drug therapy: Secondary | ICD-10-CM | POA: Insufficient documentation

## 2024-04-12 DIAGNOSIS — N390 Urinary tract infection, site not specified: Secondary | ICD-10-CM | POA: Diagnosis not present

## 2024-04-12 DIAGNOSIS — R400 Somnolence: Secondary | ICD-10-CM

## 2024-04-12 DIAGNOSIS — I10 Essential (primary) hypertension: Secondary | ICD-10-CM | POA: Diagnosis not present

## 2024-04-12 DIAGNOSIS — F32A Depression, unspecified: Secondary | ICD-10-CM | POA: Insufficient documentation

## 2024-04-12 DIAGNOSIS — E43 Unspecified severe protein-calorie malnutrition: Secondary | ICD-10-CM | POA: Diagnosis not present

## 2024-04-12 DIAGNOSIS — Z681 Body mass index (BMI) 19 or less, adult: Secondary | ICD-10-CM | POA: Diagnosis not present

## 2024-04-12 DIAGNOSIS — G9341 Metabolic encephalopathy: Secondary | ICD-10-CM | POA: Diagnosis not present

## 2024-04-12 DIAGNOSIS — Z9181 History of falling: Secondary | ICD-10-CM | POA: Diagnosis not present

## 2024-04-12 LAB — COMPREHENSIVE METABOLIC PANEL WITH GFR
ALT: 10 U/L (ref 0–44)
AST: 12 U/L — ABNORMAL LOW (ref 15–41)
Albumin: 2.9 g/dL — ABNORMAL LOW (ref 3.5–5.0)
Alkaline Phosphatase: 65 U/L (ref 38–126)
Anion gap: 10 (ref 5–15)
BUN: 22 mg/dL (ref 8–23)
CO2: 23 mmol/L (ref 22–32)
Calcium: 8.6 mg/dL — ABNORMAL LOW (ref 8.9–10.3)
Chloride: 107 mmol/L (ref 98–111)
Creatinine, Ser: 0.55 mg/dL (ref 0.44–1.00)
GFR, Estimated: >60 mL/min (ref >60)
Glucose, Bld: 115 mg/dL — ABNORMAL HIGH (ref 70–99)
Potassium: 2.2 mmol/L — CL (ref 3.5–5.1)
Sodium: 140 mmol/L (ref 135–145)
Total Bilirubin: 0.9 mg/dL (ref 0.0–1.2)
Total Protein: 6.3 g/dL — ABNORMAL LOW (ref 6.5–8.1)

## 2024-04-12 LAB — CBC WITH DIFFERENTIAL/PLATELET
Abs Immature Granulocytes: 0.09 K/uL — ABNORMAL HIGH (ref 0.00–0.07)
Basophils Absolute: 0 K/uL (ref 0.0–0.1)
Basophils Relative: 0 %
Eosinophils Absolute: 0 K/uL (ref 0.0–0.5)
Eosinophils Relative: 0 %
HCT: 38.1 % (ref 36.0–46.0)
Hemoglobin: 12.8 g/dL (ref 12.0–15.0)
Immature Granulocytes: 1 %
Lymphocytes Relative: 3 %
Lymphs Abs: 0.4 K/uL — ABNORMAL LOW (ref 0.7–4.0)
MCH: 29.7 pg (ref 26.0–34.0)
MCHC: 33.6 g/dL (ref 30.0–36.0)
MCV: 88.4 fL (ref 80.0–100.0)
Monocytes Absolute: 0.8 K/uL (ref 0.1–1.0)
Monocytes Relative: 6 %
Neutro Abs: 12.6 K/uL — ABNORMAL HIGH (ref 1.7–7.7)
Neutrophils Relative %: 90 %
Platelets: 154 K/uL (ref 150–400)
RBC: 4.31 MIL/uL (ref 3.87–5.11)
RDW: 12.2 % (ref 11.5–15.5)
WBC: 14 K/uL — ABNORMAL HIGH (ref 4.0–10.5)
nRBC: 0 % (ref 0.0–0.2)

## 2024-04-12 LAB — URINALYSIS, ROUTINE W REFLEX MICROSCOPIC
Bilirubin Urine: NEGATIVE
Glucose, UA: NEGATIVE mg/dL
Ketones, ur: NEGATIVE mg/dL
Nitrite: NEGATIVE
Protein, ur: 30 mg/dL — AB
Specific Gravity, Urine: 1.014 (ref 1.005–1.030)
WBC, UA: >50 WBC/hpf (ref 0–5)
pH: 5 (ref 5.0–8.0)

## 2024-04-12 LAB — MAGNESIUM: Magnesium: 1.8 mg/dL (ref 1.7–2.4)

## 2024-04-12 LAB — CBG MONITORING, ED: Glucose-Capillary: 130 mg/dL — ABNORMAL HIGH (ref 70–99)

## 2024-04-12 MED ORDER — ONDANSETRON HCL 4 MG/2ML IJ SOLN: 4.0000 mg | Freq: Four times a day (QID) | INTRAMUSCULAR | Status: DC | PRN

## 2024-04-12 MED ORDER — MEGESTROL ACETATE 20 MG PO TABS
20.0000 mg | ORAL_TABLET | Freq: Two times a day (BID) | ORAL | Status: DC
  Administered 2024-04-13 – 2024-04-16 (×8): 20 mg via ORAL
  Filled 2024-04-12 (×9): qty 1

## 2024-04-12 MED ORDER — HYDRALAZINE HCL 20 MG/ML IJ SOLN
5.0000 mg | Freq: Four times a day (QID) | INTRAMUSCULAR | Status: DC | PRN
  Administered 2024-04-12: 5 mg via INTRAVENOUS
  Filled 2024-04-12: qty 1

## 2024-04-12 MED ORDER — ONDANSETRON HCL 4 MG PO TABS: 4.0000 mg | ORAL_TABLET | Freq: Four times a day (QID) | ORAL | Status: DC | PRN

## 2024-04-12 MED ORDER — SODIUM CHLORIDE 0.9% FLUSH
3.0000 mL | Freq: Two times a day (BID) | INTRAVENOUS | Status: DC
  Administered 2024-04-12 – 2024-04-16 (×9): 3 mL via INTRAVENOUS

## 2024-04-12 MED ORDER — SODIUM CHLORIDE 0.9% FLUSH: 3.0000 mL | INTRAVENOUS | Status: DC | PRN

## 2024-04-12 MED ORDER — ENOXAPARIN SODIUM 30 MG/0.3ML IJ SOSY
30.0000 mg | PREFILLED_SYRINGE | INTRAMUSCULAR | Status: DC
  Administered 2024-04-12 – 2024-04-15 (×4): 30 mg via SUBCUTANEOUS
  Filled 2024-04-12: qty 0.3

## 2024-04-12 MED ORDER — ACETAMINOPHEN 325 MG PO TABS
650.0000 mg | ORAL_TABLET | Freq: Four times a day (QID) | ORAL | Status: DC | PRN
  Administered 2024-04-14 – 2024-04-16 (×2): 650 mg via ORAL
  Filled 2024-04-12 (×3): qty 2

## 2024-04-12 MED ORDER — POTASSIUM CHLORIDE IN NACL 20-0.9 MEQ/L-% IV SOLN
Freq: Once | INTRAVENOUS | Status: AC
  Filled 2024-04-12: qty 1000

## 2024-04-12 MED ORDER — ESCITALOPRAM OXALATE 10 MG PO TABS
10.0000 mg | ORAL_TABLET | Freq: Every day | ORAL | Status: DC
  Administered 2024-04-13 – 2024-04-16 (×4): 10 mg via ORAL
  Filled 2024-04-12 (×4): qty 1

## 2024-04-12 MED ORDER — PANTOPRAZOLE SODIUM 40 MG PO TBEC
40.0000 mg | DELAYED_RELEASE_TABLET | Freq: Every day | ORAL | Status: DC
  Administered 2024-04-13 – 2024-04-16 (×4): 40 mg via ORAL
  Filled 2024-04-12 (×4): qty 1

## 2024-04-12 MED ORDER — POTASSIUM CHLORIDE CRYS ER 20 MEQ PO TBCR
40.0000 meq | EXTENDED_RELEASE_TABLET | Freq: Once | ORAL | Status: AC
  Administered 2024-04-12: 40 meq via ORAL
  Filled 2024-04-12: qty 2

## 2024-04-12 MED ORDER — SENNOSIDES-DOCUSATE SODIUM 8.6-50 MG PO TABS
1.0000 | ORAL_TABLET | Freq: Every day | ORAL | Status: DC | PRN
  Administered 2024-04-15: 1 via ORAL
  Filled 2024-04-12: qty 1

## 2024-04-12 MED ORDER — SODIUM CHLORIDE 0.9 % IV SOLN
1.0000 g | Freq: Every day | INTRAVENOUS | Status: AC
  Administered 2024-04-13 – 2024-04-14 (×2): 1 g via INTRAVENOUS
  Filled 2024-04-12 (×2): qty 10

## 2024-04-12 MED ORDER — SODIUM CHLORIDE 0.9 % IV SOLN: 250.0000 mL | INTRAVENOUS | Status: AC | PRN

## 2024-04-12 MED ORDER — ACETAMINOPHEN 650 MG RE SUPP: 650.0000 mg | Freq: Four times a day (QID) | RECTAL | Status: DC | PRN

## 2024-04-12 MED ORDER — ACETAMINOPHEN 500 MG PO TABS: 500.0000 mg | ORAL_TABLET | Freq: Four times a day (QID) | ORAL | Status: DC | PRN

## 2024-04-12 MED ORDER — POTASSIUM CHLORIDE CRYS ER 20 MEQ PO TBCR
40.0000 meq | EXTENDED_RELEASE_TABLET | Freq: Once | ORAL | Status: AC
  Administered 2024-04-12: 40 meq via ORAL

## 2024-04-12 MED ORDER — SODIUM CHLORIDE 0.9 % IV SOLN
1.0000 g | Freq: Once | INTRAVENOUS | Status: AC
  Administered 2024-04-12: 1 g via INTRAVENOUS
  Filled 2024-04-12: qty 10

## 2024-04-12 NOTE — Hospital Course (Addendum)
 Elaine Rivera is a 78 y.o. female with a history of anemia, depression, GERD, hypertension, malnutrition, stroke, dementia.  Patient presented secondary to altered mental status with concern for urinary tract infection.  Patient started empirically on antibiotics and urine culture significant for Klebsiella pneumoniae. Patient completed antibiotic treatment course.

## 2024-04-12 NOTE — ED Provider Notes (Signed)
 [place] EMERGENCY DEPARTMENT AT [facility] Provider Note   CSN: [id number] Arrival date & time: 04/12/24  1229     Patient presents with: Altered Mental Status   Elaine Rivera is a 78 y.o. female.   HPI Patient presents from nursing facility with staff concerns of difficulty awakening the patient.  Patient cannot provide details, level 5 caveat, dementia.  No reported fall, trauma, fever, vomiting.  Patient reportedly has had altered behavior for 2 days.  Reportedly she was difficult to awaken this morning.  EMS reports no hemodynamic instability.    Prior to Admission medications   Medication Sig Start Date End Date Taking? Authorizing Provider  acetaminophen  (TYLENOL ) 500 MG tablet Take 500-1,000 mg by mouth every 6 (six) hours as needed for moderate pain or mild pain.   Yes [provider]  Camphor-Menthol-Methyl Sal (SALONPAS) 3.10-03-08 % PTCH Apply 1 patch topically 2 (two) times daily as needed (For pain).   Yes [provider]  diphenoxylate-atropine (LOMOTIL) 2.5-0.025 MG tablet Take 1 tablet by mouth every 6 (six) hours as needed for diarrhea or loose stools. 10/20/23  Yes [provider]  gabapentin  (NEURONTIN ) 100 MG capsule Take 100 mg by mouth 3 (three) times daily.   Yes [provider]  megestrol  (MEGACE ) 20 MG tablet Take 20 mg by mouth 2 (two) times daily.   Yes [provider]  pantoprazole  (PROTONIX ) 40 MG tablet Take 1 tablet (40 mg total) by mouth daily. 12/29/23 04/12/24 Yes Leotis Bogus, MD  polyethylene glycol (MIRALAX  / GLYCOLAX ) 17 g packet Take 17 g by mouth daily as needed for mild constipation. 01/24/24  Yes Amin, Ankit C, MD  senna-docusate (SENOKOT-S) 8.6-50 MG tablet Take 1 tablet by mouth at bedtime as needed for moderate constipation. Patient taking differently: Take 1 tablet by mouth daily as needed for moderate constipation. 01/24/24  Yes Amin, Ankit C, MD  escitalopram  (LEXAPRO ) 10 MG tablet  Take 10 mg by mouth daily. 12/16/23   [provider]  feeding supplement (ENSURE ENLIVE / ENSURE PLUS) LIQD Take 237 mLs by mouth 2 (two) times daily between meals. 01/24/24   Caleen Burgess BROCKS, MD  ferrous sulfate  325 (65 FE) MG tablet Take 1 tablet (325 mg total) by mouth daily with breakfast. 12/29/23 04/27/24  Leotis Bogus, MD  oxyCODONE  (OXY IR/ROXICODONE ) 5 MG immediate release tablet Take 1 tablet (5 mg total) by mouth every 6 (six) hours as needed for moderate pain (pain score 4-6) or severe pain (pain score 7-10). 01/24/24   Amin, Ankit C, MD  promethazine -dextromethorphan (PROMETHAZINE -DM) 6.25-15 MG/5ML syrup Take 5 mLs by mouth every 4 (four) hours as needed. 11/01/23   [provider]    Allergies: Sulfa antibiotics and Tramadol     Review of Systems  Updated Vital Signs BP (!) 164/88   Pulse 94   Temp 98.2 F (36.8 C) (Oral)   Resp 16   Ht 4' 11 (1.499 m)   Wt 40 kg   SpO2 93%   BMI 17.81 kg/m   Physical Exam Vitals and nursing note reviewed.  Constitutional:      General: She is not in acute distress.    Appearance: She is well-developed.  HENT:     Head: Normocephalic and atraumatic.  Eyes:     Conjunctiva/sclera: Conjunctivae normal.  Cardiovascular:     Rate and Rhythm: Normal rate and regular rhythm.  Pulmonary:     Effort: Pulmonary effort is normal. No respiratory distress.  Breath sounds: Normal breath sounds. No stridor.  Abdominal:     General: There is no distension.  Skin:    General: Skin is warm and dry.  Neurological:     Mental Status: She is alert.     Cranial Nerves: No cranial nerve deficit.     Comments: No obvious neurodeficit beyond disorientation, patient moves extremities spontaneously.  She does not follow commands consistently.  Psychiatric:        Mood and Affect: Mood normal.        Speech: Speech is delayed.        Behavior: Behavior is withdrawn.     (all labs ordered are listed, but only abnormal results are  displayed) Labs Reviewed  COMPREHENSIVE METABOLIC PANEL WITH GFR - Abnormal; Notable for the following components:      Result Value   Potassium 2.2 (*)    Glucose, Bld 115 (*)    Calcium  8.6 (*)    Total Protein 6.3 (*)    Albumin 2.9 (*)    AST 12 (*)    All other components within normal limits  CBC WITH DIFFERENTIAL/PLATELET - Abnormal; Notable for the following components:   WBC 14.0 (*)    Neutro Abs 12.6 (*)    Lymphs Abs 0.4 (*)    Abs Immature Granulocytes 0.09 (*)    All other components within normal limits  URINALYSIS, ROUTINE W REFLEX MICROSCOPIC - Abnormal; Notable for the following components:   APPearance CLOUDY (*)    Hgb urine dipstick MODERATE (*)    Protein, ur 30 (*)    Leukocytes,Ua LARGE (*)    Bacteria, UA MANY (*)    All other components within normal limits  CBG MONITORING, ED - Abnormal; Notable for the following components:   Glucose-Capillary 130 (*)    All other components within normal limits  MAGNESIUM     EKG: Sinus rhythm, rate 97, T wave flattening, no hyperacuity, abnormal, nonischemic overtly.  Radiology: CT HEAD WO CONTRAST Result Date: 04/12/2024 CLINICAL DATA:  78 year old female with unexplained altered mental status. History of partially thrombosed giant right PICA aneurysm. EXAM: CT HEAD WITHOUT CONTRAST TECHNIQUE: Contiguous axial images were obtained from the base of the skull through the vertex without intravenous contrast. RADIATION DOSE REDUCTION: This exam was performed according to the departmental dose-optimization program which includes automated exposure control, adjustment of the mA and/or kV according to patient size and/or use of iterative reconstruction technique. COMPARISON:  CTA head and neck 03/14/2021 and earlier. Head CT and brain MRI 01/20/2024 FINDINGS: Brain: Mixed density but predominantly hyperdense round, oval roughly 2 cm mass at the right lateral medulla, cisterna magna corresponds to chronic PICA aneurysm and is  stable. Stable right medullary mass effect, but no brainstem or cerebellar edema identified. Stable background cerebral volume, nonspecific lateral and 3rd ventriculomegaly. No supratentorial midline shift or mass effect. Stable confluent periventricular white matter hypodensity. No acute intracranial hemorrhage identified. No cortically based acute infarct identified. Vascular: Chronic distal right vertebral artery vascular stent, tortuosity. No suspicious intracranial vascular hyperdensity. Skull: Chronic right suboccipital craniectomy. No acute osseous abnormality identified. Sinuses/Orbits: Chronic sphenoid sinus mucosal thickening and some bubbly opacity. Other paranasal sinuses and mastoids are stable and well aerated. Other: No acute orbit or scalp soft tissue finding. Chronic right suboccipital soft tissue scarring. IMPRESSION: 1. No acute intracranial abnormality. 2. 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 3. Chronic nonspecific supratentorial ventriculomegaly  and cerebral white matter disease. Electronically Signed   By: VEAR Hurst M.D.   On: 04/12/2024 13:15     Procedures   Medications Ordered in the ED  0.9 % NaCl with KCl 20 mEq/ L  infusion (has no administration in time range)  cefTRIAXone  (ROCEPHIN ) 1 g in sodium chloride  0.9 % 100 mL IVPB (0 g Intravenous Stopped 04/12/24 1447)                                    Medical Decision Making Elderly female with dementia which is limiting in terms of history presents with staff of her facility's concerns of decreased interactivity, somnolence.  Patient is hemodynamically unremarkable, does not provide her own history, concern for urinary tract infection, bacteremia, sepsis, pneumonia, intracranial abnormality given her history of prior aneurysm. Cardiac 95 sinus normal pulse ox 95% room air normal  Amount and/or Complexity of  Data Reviewed Independent Historian: EMS External Data Reviewed: notes. Labs: ordered. Decision-making details documented in ED Course. Radiology: ordered and independent interpretation performed. Decision-making details documented in ED Course. ECG/medicine tests: ordered and independent interpretation performed. Decision-making details documented in ED Course.  Risk Prescription drug management.   2:47 PM Head CT unremarkable, stable changes of prior aneurysm.  Urinalysis with infection, leukocytosis noted.  Patient received ceftriaxone , IV.  No fever, no tachycardia, no hypotension, little evidence for bacteremia, sepsis.  However, patient has critically abnormal potassium value requiring repletion with IV, oral supplement. EKG reassuring, but given the substantial abnormal findings, the context of altered mental status, urinary tract infection patient will be admitted for further monitoring, management.    Final diagnoses:  Lower urinary tract infectious disease  Somnolence  Hypokalemia  CRITICAL CARE Performed by: Lamar Salen Total critical care time: 35 minutes Critical care time was exclusive of separately billable procedures and treating other patients. Critical care was necessary to treat or prevent imminent or life-threatening deterioration. Critical care was time spent personally by me on the following activities: development of treatment plan with patient and/or surrogate as well as nursing, discussions with consultants, evaluation of patient's response to treatment, examination of patient, obtaining history from patient or surrogate, ordering and performing treatments and interventions, ordering and review of laboratory studies, ordering and review of radiographic studies, pulse oximetry and re-evaluation of patient's condition.    Salen Lamar, MD 04/12/24 1450

## 2024-04-12 NOTE — H&P (Signed)
 [facility] History and Physical  Elaine Rivera FMW:[id number] DOB: [birth year] DOA: 04/12/2024  Referring physician: ED  PCP: Patient, No Pcp Per   Patient is coming from: Skilled nursing facility  Chief Complaint: Altered mental status  HPI:  Patient is a 78 years old female with past medical history of anemia, depression, GERD, hypertension, malnutrition and stroke presented to hospital from assisted living facility with complaints of altered mental status with difficulty arousing and took multiple attempts to wake her up at the skilled nursing facility.  Has had altered behavior for the last 2 days or so and the staff suspected UTI.  Of note, patient does have baseline history of dementia and chronic headaches.  Patient was unable to provide any information.  There is no reported history of fever chills, nausea vomiting diarrhea.  As per the patient's daughter patient does have a history of falls in has been needing more assistance than usual including wheelchair.  Patient denies any shortness of breath, dyspnea or chest pain.  Patient is a poor historian.  In the ED, vitals were notable for elevated blood pressure.  No fever.  CBC showed leukocytosis.  Urinalysis showed a large leukocytes and WBC more than 50.  Nitrite was negative.  CMP was notable for potassium of 2.2.  Labs showed patient was noted to have head CT scan which was unremarkable.  Patient was then considered for admission to hospital for further evaluation and treatment.   Assessment and Plan Principal Problem:   Altered mental status Active Problems:   Depression   HTN (hypertension)   GERD (gastroesophageal reflux disease)   Anemia   Hypokalemia  Altered mental status likely metabolic encephalopathy from UTI.  History of dementia.   Follow urine cultures.  Continue IV Rocephin .  Supportive care.  Delirium precautions.  Has mild leukocytosis we will continue to monitor.  Will hold off with gabapentin  from  home for now.  Significant hypokalemia.  Potassium of 2.2 at this time.  Will replace aggressively.  Check levels in AM.  Received normal saline with 20 mEq of potassium in potassium and chloride orally.  Continue telemetry monitoring.  Check BMP in AM.  History of GERD Continue Protonix   Hypertension We will put on IV as needed hydralazine .  Blood pressure slightly elevated.  Will continue to monitor.  History of depression.  On Lexapro .  Patient's daughter states that it has not been really working for her.  History of malnutrition On Megace  as outpatient.  Will continue.  Has poor appetite.  Will consult nutrition services.  Anemia Will monitor CBC.  Debility, deconditioning, history of falls..  Currently at assisted living facility.  Has history of falls and is on wheelchair.  Will get PT OT evaluation.  DVT Prophylaxis: lovenox    Review of Systems:  Limited information could be obtained due to dementia and patient is poor historian.  Additional information obtained from the patient's daughter.   Past Medical History:  Diagnosis Date   Anemia    years ago   Aneurysm (HCC)    right PICA aneurysm and stent   Aphasia    Arthritis    Basal cell carcinoma of eye    biopsy of left eye/ non cancerous   Chronic back pain    Complication of anesthesia    nausea/vomiting   Depression    Failure to thrive in adult    GERD (gastroesophageal reflux disease)    GI bleed 10/2021   acute GI bleed with gastric erosion  Headache    Herpes    History of hiatal hernia 10/2021   5 cm hiatal hernia   History of kidney stones    Hypertension    no longer on medications   Hypokalemia    Left anterior fascicular block 11/11/2021   noted on EKG   Scoliosis    Seizures (HCC)    Manifesting with aphasia   Severe protein-calorie malnutrition (HCC)    Stroke [facility])    Past Surgical History:  Procedure Laterality Date   BIOPSY  03/17/2023   Procedure: BIOPSY;  Surgeon: Rollin Dover, MD;  Location: [facility];  Service: Gastroenterology;;   CEREBRAL ANEURYSM REPAIR     1998   COLONOSCOPY     ECTOPIC PREGNANCY SURGERY     x2    ENTEROSCOPY N/A 03/01/2020   Procedure: ENTEROSCOPY;  Surgeon: Rollin Dover, MD;  Location: [facility];  Service: Endoscopy;  Laterality: N/A;   ENTEROSCOPY N/A 11/14/2021   Procedure: ENTEROSCOPY;  Surgeon: Rollin Dover, MD;  Location: [facility];  Service: Endoscopy;  Laterality: N/A;   ENTEROSCOPY N/A 03/17/2023   Procedure: ENTEROSCOPY;  Surgeon: Rollin Dover, MD;  Location: [facility];  Service: Gastroenterology;  Laterality: N/A;   ESOPHAGOGASTRODUODENOSCOPY N/A 04/22/2016   Procedure: ESOPHAGOGASTRODUODENOSCOPY (EGD);  Surgeon: Renaye Sous, MD;  Location: [facility];  Service: Endoscopy;  Laterality: N/A;   ESOPHAGOGASTRODUODENOSCOPY (EGD) WITH PROPOFOL  N/A 11/12/2021   Procedure: ESOPHAGOGASTRODUODENOSCOPY (EGD) WITH PROPOFOL ;  Surgeon: Rollin Dover, MD;  Location: [facility];  Service: Endoscopy;  Laterality: N/A;   FEMORAL HERNIA REPAIR Right 07/02/2022   Procedure: Laparoscopic right femoral hernia with mesh;  Surgeon: Rubin Calamity, MD;  Location: [facility]S;  Service: General;  Laterality: Right;   GIVENS CAPSULE STUDY N/A 11/12/2021   Procedure: GIVENS CAPSULE STUDY;  Surgeon: Rollin Dover, MD;  Location: [facility];  Service: Endoscopy;  Laterality: N/A;   IR 3D INDEPENDENT WKST  09/05/2018   IR ANGIO INTRA EXTRACRAN SEL COM CAROTID INNOMINATE BILAT MOD SED  09/05/2018   IR ANGIO VERTEBRAL SEL VERTEBRAL UNI R MOD SED  09/05/2018   IR ANGIOGRAM FOLLOW UP STUDY  09/05/2018   IR RADIOLOGIST EVAL & MGMT  03/28/2021   IR TRANSCATH/EMBOLIZ  09/05/2018   PARATHYROIDECTOMY     2002   RADIOLOGY WITH ANESTHESIA N/A 09/05/2018   Procedure: EMBOLIZATION;  Surgeon: Radiologist, Medication, MD;  Location: [facility] OR;  Service: Radiology;  Laterality: N/A;   TONSILLECTOMY     1953   VAGINAL HYSTERECTOMY     2001   VOCAL CORD  LATERALIZATION, ENDOSCOPIC APPROACH W/ MLB     2007 (@[facility] )   WRIST SURGERY     left side/2008   WRIST SURGERY     right side/ 2002    Social History:  reports that she has quit smoking. Her smoking use included cigarettes. She has never used smokeless tobacco. She reports that she does not drink alcohol  and does not use drugs.  Allergies  Allergen Reactions   Sulfa Antibiotics Other (See Comments)    Reaction:  Unknown    Tramadol  Other (See Comments)    Seizures. Allergy not listed on MAR     Family History  Problem Relation Age of Onset   Cancer Mother    Suicidality Father      Prior to Admission medications   Medication Sig Start Date End Date Taking? Authorizing Provider  acetaminophen  (TYLENOL ) 500 MG tablet Take 500 mg by mouth every 6 (six) hours as needed for moderate pain (  pain score 4-6) or mild pain (pain score 1-3).   Yes [provider]  Camphor-Menthol-Methyl Sal (SALONPAS) 3.10-03-08 % PTCH Apply 1 patch topically 2 (two) times daily as needed (For pain).   Yes [provider]  diphenoxylate-atropine (LOMOTIL) 2.5-0.025 MG tablet Take 1 tablet by mouth every 6 (six) hours as needed for diarrhea or loose stools. 10/20/23  Yes [provider]  escitalopram  (LEXAPRO ) 10 MG tablet Take 10 mg by mouth daily. 12/16/23  Yes [provider]  ferrous sulfate  325 (65 FE) MG tablet Take 1 tablet (325 mg total) by mouth daily with breakfast. 12/29/23 04/27/24 Yes Leotis Bogus, MD  gabapentin  (NEURONTIN ) 100 MG capsule Take 100 mg by mouth 3 (three) times daily.   Yes [provider]  megestrol  (MEGACE ) 20 MG tablet Take 20 mg by mouth 2 (two) times daily.   Yes [provider]  pantoprazole  (PROTONIX ) 40 MG tablet Take 1 tablet (40 mg total) by mouth daily. 12/29/23 04/12/24 Yes Leotis Bogus, MD  polyethylene glycol (MIRALAX  / GLYCOLAX ) 17 g packet Take 17 g by mouth daily as needed for mild constipation. 01/24/24  Yes Amin,  Ankit C, MD  senna-docusate (SENOKOT-S) 8.6-50 MG tablet Take 1 tablet by mouth at bedtime as needed for moderate constipation. Patient taking differently: Take 1 tablet by mouth daily as needed for moderate constipation. 01/24/24  Yes Caleen Burgess BROCKS, MD    Physical Exam:  Vitals:   04/12/24 1237 04/12/24 1316 04/12/24 1645 04/12/24 1652  BP: (!) 164/88  (!) 153/100   Pulse: 94  95   Resp: 16  (!) 21   Temp: 98.2 F (36.8 C)   97.6 F (36.4 C)  TempSrc: Oral   Axillary  SpO2: 93% 93% 95%   Weight:      Height:       Wt Readings from Last 3 Encounters:  04/12/24 40 kg  01/25/24 38.1 kg  12/24/23 43 kg   Body mass index is 17.81 kg/m.  General: Thinly built, not in obvious distress, elderly female, Communicative, appears weak and deconditioned HENT: Normocephalic, No scleral pallor or icterus noted. Oral mucosa is moist.  Chest:  Clear breath sounds.  . No crackles or wheezes.  CVS: S1 &S2 heard. No murmur.  Regular rate and rhythm. Abdomen: Soft, nontender, nondistended.  Bowel sounds are heard. No abdominal mass palpated Extremities: No cyanosis, clubbing or edema.  Peripheral pulses are palpable. Psych: Alert, awake and oriented to place and person, Communicative, CNS:  No cranial nerve deficits.  Generalized weakness noted Skin: Warm and dry.  No rashes noted.  Labs on Admission:   CBC: Recent Labs  Lab 04/12/24 1319  WBC 14.0*  NEUTROABS 12.6*  HGB 12.8  HCT 38.1  MCV 88.4  PLT 154    Basic Metabolic Panel: Recent Labs  Lab 04/12/24 1319  NA 140  K 2.2*  CL 107  CO2 23  GLUCOSE 115*  BUN 22  CREATININE 0.55  CALCIUM  8.6*  MG 1.8    Liver Function Tests: Recent Labs  Lab 04/12/24 1319  AST 12*  ALT 10  ALKPHOS 65  BILITOT 0.9  PROT 6.3*  ALBUMIN 2.9*   No results for input(s): LIPASE, AMYLASE in the last 168 hours. No results for input(s): AMMONIA in the last 168 hours.  Cardiac Enzymes: No results for input(s): CKTOTAL,  CKMB, CKMBINDEX, TROPONINI in the last 168 hours.  BNP (last 3 results) No results for input(s): BNP in the last 8760  hours.  ProBNP (last 3 results) No results for input(s): PROBNP in the last 8760 hours.  CBG: Recent Labs  Lab 04/12/24 1316  GLUCAP 130*    Lipase     Component Value Date/Time   LIPASE 42 01/20/2024 1200     Urinalysis    Component Value Date/Time   COLORURINE YELLOW 04/12/2024 1319   APPEARANCEUR CLOUDY (A) 04/12/2024 1319   LABSPEC 1.014 04/12/2024 1319   PHURINE 5.0 04/12/2024 1319   GLUCOSEU NEGATIVE 04/12/2024 1319   HGBUR MODERATE (A) 04/12/2024 1319   BILIRUBINUR NEGATIVE 04/12/2024 1319   KETONESUR NEGATIVE 04/12/2024 1319   PROTEINUR 30 (A) 04/12/2024 1319   UROBILINOGEN 0.2 12/26/2019 1226   NITRITE NEGATIVE 04/12/2024 1319   LEUKOCYTESUR LARGE (A) 04/12/2024 1319     Drugs of Abuse  No results found for: LABOPIA, COCAINSCRNUR, LABBENZ, AMPHETMU, THCU, LABBARB    Radiological Exams on Admission: CT HEAD WO CONTRAST Result Date: 04/12/2024 CLINICAL DATA:  78 year old female with unexplained altered mental status. History of partially thrombosed giant right PICA aneurysm. EXAM: CT HEAD WITHOUT CONTRAST TECHNIQUE: Contiguous axial images were obtained from the base of the skull through the vertex without intravenous contrast. RADIATION DOSE REDUCTION: This exam was performed according to the departmental dose-optimization program which includes automated exposure control, adjustment of the mA and/or kV according to patient size and/or use of iterative reconstruction technique. COMPARISON:  CTA head and neck 03/14/2021 and earlier. Head CT and brain MRI 01/20/2024 FINDINGS: Brain: Mixed density but predominantly hyperdense round, oval roughly 2 cm mass at the right lateral medulla, cisterna magna corresponds to chronic PICA aneurysm and is stable. Stable right medullary mass effect, but no brainstem or cerebellar edema  identified. Stable background cerebral volume, nonspecific lateral and 3rd ventriculomegaly. No supratentorial midline shift or mass effect. Stable confluent periventricular white matter hypodensity. No acute intracranial hemorrhage identified. No cortically based acute infarct identified. Vascular: Chronic distal right vertebral artery vascular stent, tortuosity. No suspicious intracranial vascular hyperdensity. Skull: Chronic right suboccipital craniectomy. No acute osseous abnormality identified. Sinuses/Orbits: Chronic sphenoid sinus mucosal thickening and some bubbly opacity. Other paranasal sinuses and mastoids are stable and well aerated. Other: No acute orbit or scalp soft tissue finding. Chronic right suboccipital soft tissue scarring. IMPRESSION: 1. No acute intracranial abnormality. 2. 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 3. Chronic nonspecific supratentorial ventriculomegaly and cerebral white matter disease. Electronically Signed   By: VEAR Hurst M.D.   On: 04/12/2024 13:15    EKG: Personally reviewed by me which shows sinus rhythm   Consultant: None  Code Status: DNR as confirmed by the patient's daughter who is also her health power of attorney.  Microbiology urine culture  Antibiotics: Rocephin  IV  Family Communication:  Patients' condition and plan of care including tests being ordered have been discussed with the patient and the patient's daughter on the phone who indicate understanding and agree with the plan.   Status is: Inpatient   Severity of Illness: The appropriate patient status for this patient is INPATIENT. Inpatient status is judged to be reasonable and necessary in order to provide the required intensity of service to ensure the patient's safety. The patient's presenting symptoms, physical exam findings, and initial radiographic and laboratory data in  the context of their chronic comorbidities is felt to place them at high risk for further clinical deterioration. Furthermore, it is not anticipated that the patient will be medically  stable for discharge from the hospital within 2 midnights of admission.   * I certify that at the point of admission it is my clinical judgment that the patient will require inpatient hospital care spanning beyond 2 midnights from the point of admission due to high intensity of service, high risk for further deterioration and high frequency of surveillance required.*  Signed, Vernal Alstrom, MD [facility] 04/12/2024

## 2024-04-12 NOTE — ED Triage Notes (Addendum)
 Patient BIB [facility] from [facility]. Staff said patient has been hard to arouse and took multiple attempts to wake her up. Increased altered behavior for 2 days. Staff suspects UTI. Has dementia. Has chronic headaches.   EMS 20G left AC

## 2024-04-12 NOTE — ED Notes (Signed)
 ED TO INPATIENT HANDOFF REPORT  Name/Age/Gender Elaine Rivera 78 y.o. female  Code Status    Code Status Orders  (From admission, onward)           Start     Ordered   04/12/24 1535  Full code  Continuous       Question:  By:  Answer:  Consent: discussion documented in EHR   04/12/24 1536           Code Status History     Date Active Date Inactive Code Status Order ID Comments User Context   01/20/2024 1558 01/26/2024 2223 Full Code [id number]  Moody Alto, MD ED   12/24/2023 1445 12/28/2023 2328 Full Code [id number]  Zella Katha CHRISTELLA, MD ED   03/16/2023 1742 03/18/2023 2231 Full Code [id number]  Zella Katha CHRISTELLA, MD ED   11/11/2021 1715 11/14/2021 2201 Full Code [id number]  Odell Celinda Balo, MD ED   10/27/2021 0104 10/27/2021 0721 Full Code [id number]  Howerter, Eva NOVAK, DO ED   08/22/2021 2337 08/28/2021 1722 Full Code [id number]  Tobie Yetta CHRISTELLA, MD ED   03/14/2021 0247 03/15/2021 1942 Full Code [id number]  Shona Terry SAILOR, DO ED   03/05/2021 0332 03/10/2021 1704 Full Code [id number]  Charlton Evalene RAMAN, MD ED   02/28/2020 1747 03/01/2020 2134 DNR [id number]  Cheryle Page, MD ED   10/03/2018 1824 10/04/2018 2129 Full Code [id number]  Braden Lash, MD ED   04/20/2016 2334 04/23/2016 1731 Full Code [id number]  Franchot Carrier, MD Inpatient       Home/SNF/Other Skilled nursing facility  Chief Complaint Altered mental status [R41.82]  Level of Care/Admitting Diagnosis ED Disposition     ED Disposition  Admit   Condition  --   Comment  Hospital Area: [facility] [[id number]]  Level of Care: Med-Surg [16]  May admit patient to [facility] or [facility] if equivalent level of care is available:: No  Covid Evaluation: Confirmed COVID Positive  Diagnosis: Altered mental status [780.97.ICD-9-CM]  Admitting Physician: POKHREL, LAXMAN [[id number]]  Attending Physician: POKHREL, LAXMAN [[id number]]  Certification:: I certify this patient will need inpatient services for at least 2  midnights  Expected Medical Readiness: 04/14/2024          Medical History 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   noted on EKG   Scoliosis    Seizures (HCC)    Manifesting with aphasia   Severe protein-calorie malnutrition (HCC)    Stroke (HCC)     Allergies Allergies  Allergen Reactions   Sulfa Antibiotics Other (See Comments)    Reaction:  Unknown    Tramadol  Other (See Comments)    Seizures. Allergy not listed  on [facility]     IV Location/Drains/Wounds Patient Lines/Drains/Airways Status     Active Line/Drains/Airways     Name Placement date Placement time Site Days   Peripheral IV 04/12/24 20 G Anterior;Left;Proximal Forearm 04/12/24  1402  Forearm  less than 1            Labs/Imaging Results for orders placed or performed during the hospital encounter of 04/12/24 (from the past 48 hours)  CBG monitoring, ED     Status: Abnormal   Collection Time: 04/12/24  1:16 PM  Result Value Ref Range   Glucose-Capillary 130 (H) 70 - 99 mg/dL    Comment: Glucose reference range applies only to samples taken after fasting for at least 8 hours.  Comprehensive metabolic panel     Status: Abnormal   Collection Time: 04/12/24  1:19 PM  Result Value Ref Range   Sodium 140 135 - 145 mmol/L   Potassium 2.2 (LL) 3.5 - 5.1 mmol/L    Comment: CRITICAL RESULT CALLED TO, READ BACK BY AND  VERIFIED WITH Deniqua Perry,D. EMTP AT 1423 04/12/24 MULLINS,T    Chloride 107 98 - 111 mmol/L   CO2 23 22 - 32 mmol/L   Glucose, Bld 115 (H) 70 - 99 mg/dL    Comment: Glucose reference range applies only to samples taken after fasting for at least 8 hours.   BUN 22 8 - 23 mg/dL   Creatinine, Ser 9.44 0.44 - 1.00 mg/dL   Calcium  8.6 (L) 8.9 - 10.3 mg/dL   Total Protein 6.3 (L) 6.5 - 8.1 g/dL   Albumin 2.9 (L) 3.5 - 5.0 g/dL   AST 12 (L) 15 - 41 U/L   ALT 10 0 - 44 U/L   Alkaline Phosphatase 65 38 - 126 U/L   Total Bilirubin 0.9 0.0 - 1.2 mg/dL   GFR, Estimated >39 >39 mL/min    Comment: (NOTE) Calculated using the CKD-EPI Creatinine Equation (2021)    Anion gap 10 5 - 15    Comment: Performed at [facility], [address], [place], [place] [address]  CBC WITH DIFFERENTIAL     Status: Abnormal   Collection Time: 04/12/24  1:19 PM  Result Value Ref Range   WBC 14.0 (H) 4.0 - 10.5 K/uL   RBC 4.31 3.87 - 5.11 MIL/uL   Hemoglobin 12.8 12.0 - 15.0 g/dL   HCT 61.8 63.9 - 53.9 %   MCV 88.4 80.0 - 100.0 fL   MCH 29.7 26.0 - 34.0 pg   MCHC 33.6 30.0 - 36.0 g/dL   RDW 87.7 88.4 - 84.4 %   Platelets 154 150 - 400 K/uL   nRBC 0.0 0.0 - 0.2 %   Neutrophils Relative % 90 %   Neutro Abs 12.6 (H) 1.7 - 7.7 K/uL   Lymphocytes Relative 3 %   Lymphs Abs 0.4 (L) 0.7 - 4.0 K/uL   Monocytes Relative 6 %   Monocytes Absolute 0.8 0.1 - 1.0 K/uL   Eosinophils Relative 0 %   Eosinophils Absolute 0.0 0.0 - 0.5 K/uL   Basophils Relative 0 %   Basophils Absolute 0.0 0.0 - 0.1 K/uL   Immature Granulocytes 1 %   Abs Immature Granulocytes 0.09 (H) 0.00 - 0.07 K/uL    Comment: Performed at [facility], [address], [place], [place] [address]  Urinalysis, Routine w reflex microscopic -Urine, Catheterized     Status: Abnormal   Collection Time: 04/12/24  1:19 PM  Result Value Ref  Range   Color, Urine YELLOW YELLOW   APPearance CLOUDY (A) CLEAR   Specific Gravity,  Urine 1.014 1.005 - 1.030   pH 5.0 5.0 - 8.0   Glucose, UA NEGATIVE NEGATIVE mg/dL   Hgb urine dipstick MODERATE (A) NEGATIVE   Bilirubin Urine NEGATIVE NEGATIVE   Ketones, ur NEGATIVE NEGATIVE mg/dL   Protein, ur 30 (A) NEGATIVE mg/dL   Nitrite NEGATIVE NEGATIVE   Leukocytes,Ua LARGE (A) NEGATIVE   RBC / HPF 6-10 0 - 5 RBC/hpf   WBC, UA >50 0 - 5 WBC/hpf   Bacteria, UA MANY (A) NONE SEEN   Squamous Epithelial / HPF 0-5 0 - 5 /HPF   WBC Clumps PRESENT    Mucus PRESENT     Comment: Performed at [facility], [address] [place], [place] [address]  Magnesium      Status: None   Collection Time: 04/12/24  1:19 PM  Result Value Ref Range   Magnesium  1.8 1.7 - 2.4 mg/dL    Comment: Performed at [facility], [address], [place], [place] [address]   CT HEAD WO CONTRAST Result Date: 04/12/2024 CLINICAL DATA:  78 year old female with unexplained altered mental status. History of partially thrombosed giant right PICA aneurysm. EXAM: CT HEAD WITHOUT CONTRAST TECHNIQUE: Contiguous axial images were obtained from the base of the skull through the vertex without intravenous contrast. RADIATION DOSE REDUCTION: This exam was performed according to the departmental dose-optimization program which includes automated exposure control, adjustment of the mA and/or kV according to patient size and/or use of iterative reconstruction technique. COMPARISON:  CTA head and neck 03/14/2021 and earlier. Head CT and brain MRI 01/20/2024 FINDINGS: Brain: Mixed density but predominantly hyperdense round, oval roughly 2 cm mass at the right lateral medulla, cisterna magna corresponds to chronic PICA aneurysm and is stable. Stable right medullary mass effect, but no brainstem or cerebellar edema identified. Stable background cerebral volume, nonspecific lateral and 3rd ventriculomegaly. No supratentorial midline shift or mass effect. Stable confluent periventricular white matter  hypodensity. No acute intracranial hemorrhage identified. No cortically based acute infarct identified. Vascular: Chronic distal right vertebral artery vascular stent, tortuosity. No suspicious intracranial vascular hyperdensity. Skull: Chronic right suboccipital craniectomy. No acute osseous abnormality identified. Sinuses/Orbits: Chronic sphenoid sinus mucosal thickening and some bubbly opacity. Other paranasal sinuses and mastoids are stable and well aerated. Other: No acute orbit or scalp soft tissue finding. Chronic right suboccipital soft tissue scarring. IMPRESSION: 1. No acute intracranial abnormality. 2. 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 3. Chronic nonspecific supratentorial ventriculomegaly and cerebral white matter disease. Electronically Signed   By: VEAR Hurst M.D.   On: 04/12/2024 13:15    Pending Labs Unresulted Labs (From admission, onward)     Start     Ordered   04/13/24 0500  Comprehensive metabolic panel  Tomorrow morning,   R        04/12/24 1536   04/13/24 0500  CBC  Tomorrow morning,   R        04/12/24 1536   04/13/24 0500  Magnesium   Tomorrow morning,   R        04/12/24 1536            Vitals/Pain Today's Vitals   04/12/24 1233 04/12/24 1237 04/12/24 1316 04/12/24 1645  BP:  (!) 164/88  (!) 153/100  Pulse:  94  95  Resp:  16  (!)  21  Temp:  98.2 F (36.8 C)    TempSrc:  Oral    SpO2:  93% 93% 95%  Weight: 88 lb 2.9 oz (40 kg)     Height: 4' 11 (1.499 m)     PainSc:        Isolation Precautions No active isolations  Medications Medications  0.9 % NaCl with KCl 20 mEq/ L  infusion ( Intravenous New Bag/Given 04/12/24 1647)  megestrol  (MEGACE ) tablet 20 mg (has no administration in time range)  escitalopram  (LEXAPRO ) tablet 10 mg (has no administration in time range)  pantoprazole  (PROTONIX ) EC tablet 40 mg (has no administration in  time range)  senna-docusate (Senokot-S) tablet 1 tablet (has no administration in time range)  sodium chloride  flush (NS) 0.9 % injection 3 mL (3 mLs Intravenous Given 04/12/24 1620)  sodium chloride  flush (NS) 0.9 % injection 3 mL (has no administration in time range)  0.9 %  sodium chloride  infusion (has no administration in time range)  acetaminophen  (TYLENOL ) tablet 650 mg (has no administration in time range)    Or  acetaminophen  (TYLENOL ) suppository 650 mg (has no administration in time range)  ondansetron  (ZOFRAN ) tablet 4 mg (has no administration in time range)    Or  ondansetron  (ZOFRAN ) injection 4 mg (has no administration in time range)  hydrALAZINE  (APRESOLINE ) injection 5 mg (has no administration in time range)  enoxaparin  (LOVENOX ) injection 30 mg (has no administration in time range)  cefTRIAXone  (ROCEPHIN ) 1 g in sodium chloride  0.9 % 100 mL IVPB (0 g Intravenous Stopped 04/12/24 1447)  potassium chloride  SA (KLOR-CON  M) CR tablet 40 mEq (40 mEq Oral Given 04/12/24 1619)    Mobility non-ambulatory

## 2024-04-13 DIAGNOSIS — N390 Urinary tract infection, site not specified: Secondary | ICD-10-CM

## 2024-04-13 DIAGNOSIS — R4182 Altered mental status, unspecified: Secondary | ICD-10-CM | POA: Diagnosis not present

## 2024-04-13 LAB — COMPREHENSIVE METABOLIC PANEL WITH GFR
ALT: 11 U/L (ref 0–44)
AST: 13 U/L — ABNORMAL LOW (ref 15–41)
Albumin: 2.9 g/dL — ABNORMAL LOW (ref 3.5–5.0)
Alkaline Phosphatase: 61 U/L (ref 38–126)
Anion gap: 10 (ref 5–15)
BUN: 24 mg/dL — ABNORMAL HIGH (ref 8–23)
CO2: 25 mmol/L (ref 22–32)
Calcium: 9.1 mg/dL (ref 8.9–10.3)
Chloride: 103 mmol/L (ref 98–111)
Creatinine, Ser: 0.54 mg/dL (ref 0.44–1.00)
GFR, Estimated: >60 mL/min (ref >60)
Glucose, Bld: 111 mg/dL — ABNORMAL HIGH (ref 70–99)
Potassium: 2.7 mmol/L — CL (ref 3.5–5.1)
Sodium: 138 mmol/L (ref 135–145)
Total Bilirubin: 1.1 mg/dL (ref 0.0–1.2)
Total Protein: 6.6 g/dL (ref 6.5–8.1)

## 2024-04-13 LAB — CBC
HCT: 36.3 % (ref 36.0–46.0)
Hemoglobin: 12.4 g/dL (ref 12.0–15.0)
MCH: 30.3 pg (ref 26.0–34.0)
MCHC: 34.2 g/dL (ref 30.0–36.0)
MCV: 88.8 fL (ref 80.0–100.0)
Platelets: 149 K/uL — ABNORMAL LOW (ref 150–400)
RBC: 4.09 MIL/uL (ref 3.87–5.11)
RDW: 12.1 % (ref 11.5–15.5)
WBC: 9.9 K/uL (ref 4.0–10.5)
nRBC: 0 % (ref 0.0–0.2)

## 2024-04-13 LAB — MAGNESIUM: Magnesium: 2 mg/dL (ref 1.7–2.4)

## 2024-04-13 LAB — POTASSIUM: Potassium: 3.2 mmol/L — ABNORMAL LOW (ref 3.5–5.1)

## 2024-04-13 MED ORDER — GABAPENTIN 100 MG PO CAPS
100.0000 mg | ORAL_CAPSULE | Freq: Three times a day (TID) | ORAL | Status: DC
  Administered 2024-04-13 – 2024-04-16 (×10): 100 mg via ORAL
  Filled 2024-04-13 (×10): qty 1

## 2024-04-13 MED ORDER — POTASSIUM CHLORIDE CRYS ER 20 MEQ PO TBCR
40.0000 meq | EXTENDED_RELEASE_TABLET | ORAL | Status: AC
  Administered 2024-04-13 (×2): 40 meq via ORAL
  Filled 2024-04-13 (×2): qty 2

## 2024-04-13 MED ORDER — ENSURE PLUS HIGH PROTEIN PO LIQD
237.0000 mL | Freq: Two times a day (BID) | ORAL | Status: DC
  Administered 2024-04-13 – 2024-04-16 (×4): 237 mL via ORAL

## 2024-04-13 MED ORDER — POTASSIUM CHLORIDE CRYS ER 20 MEQ PO TBCR
40.0000 meq | EXTENDED_RELEASE_TABLET | ORAL | Status: AC
  Administered 2024-04-13 – 2024-04-14 (×2): 40 meq via ORAL
  Filled 2024-04-13: qty 2

## 2024-04-13 MED ORDER — ADULT MULTIVITAMIN W/MINERALS CH
1.0000 | ORAL_TABLET | Freq: Every day | ORAL | Status: DC
  Administered 2024-04-14 – 2024-04-16 (×3): 1 via ORAL
  Filled 2024-04-13 (×3): qty 1

## 2024-04-13 NOTE — NC FL2 (Signed)
 [place]  MEDICAID FL2 LEVEL OF CARE FORM     IDENTIFICATION  Patient Name: Elaine Rivera Birthdate: [birth year] Sex: female Admission Date (Current Location): 04/12/2024  [facility] and [place] Number:  Producer, television/film/video and Address:  [facility],  [address] [place], [place] [address]      Provider Number: [id number]  Attending Physician Name and Address:  Briana Elgin LABOR, MD  Relative Name and Phone Number:  Elaine Rivera, Elaine Rivera (Daughter)  [phone]    Current Level of Care: Hospital Recommended Level of Care: Skilled Nursing Facility Prior Approval Number:    Date Approved/Denied:   PASRR Number: [id number]  Discharge Plan: SNF    Current Diagnoses: Patient Active Problem List   Diagnosis Date Noted   Altered mental status 04/12/2024   AMS (altered mental status) 01/20/2024   Back pain 01/20/2024   Accident due to mechanical fall without injury 12/24/2023   Symptomatic anemia 03/16/2023   Palliative care encounter 12/30/2021   FTT (failure to thrive) in adult 11/13/2021   GI bleed 11/13/2021   Acute lower GI bleeding 11/11/2021   Acute blood loss anemia 11/11/2021   Melena 11/11/2021   Seizure disorder (HCC) 10/27/2021   Protein-calorie malnutrition, severe 08/26/2021   Failure to thrive in adult 08/23/2021   Right 10th rib fracture 08/23/2021   Physical deconditioning 08/23/2021   Bad headache 03/14/2021   Seizure (HCC) 03/06/2021   CVA (cerebral vascular accident) (HCC) 03/06/2021   Ischemic cerebrovascular accident (CVA) (HCC) 03/05/2021   Hypokalemia 03/05/2021   Anxiety 03/05/2021   Transient speech disturbance 03/05/2021   Lung nodule 03/05/2021   Aphasia    Fall    Anemia 02/28/2020   Shortness of breath 01/24/2020   Dizziness 01/24/2020   Herpes 10/04/2018   Cerebral aneurysm 09/05/2018   NSAID long-term use 04/20/2016   Scoliosis 04/20/2016   Depression 04/20/2016   HTN (hypertension) 04/20/2016   GERD (gastroesophageal  reflux disease) 04/20/2016   Occult GI bleeding 04/20/2016    Orientation RESPIRATION BLADDER Height & Weight     Self  Normal Incontinent Weight: 88 lb 2.9 oz (40 kg) Height:  4' 11 (149.9 cm)  BEHAVIORAL SYMPTOMS/MOOD NEUROLOGICAL BOWEL NUTRITION STATUS      Incontinent Diet (see dc summary)  AMBULATORY STATUS COMMUNICATION OF NEEDS Skin   Limited Assist Verbally Normal                       Personal Care Assistance Level of Assistance  Bathing, Feeding, Dressing Bathing Assistance: Limited assistance Feeding assistance: Limited assistance Dressing Assistance: Limited assistance     Functional Limitations Info  Speech, Hearing, Sight Sight Info: Adequate Hearing Info: Adequate Speech Info: Adequate    SPECIAL CARE FACTORS FREQUENCY  PT (By licensed PT), OT (By licensed OT)     PT Frequency: 5x/wk OT Frequency: 5x/wk            Contractures Contractures Info: Not present    Additional Factors Info  Code Status, Allergies Code Status Info: DNR Allergies Info: Sulfa Antibiotics  Tramadol            Current Medications (04/13/2024):  This is the current hospital active medication list Current Facility-Administered Medications  Medication Dose Route Frequency Provider Last Rate Last Admin   0.9 %  sodium chloride  infusion  250 mL Intravenous PRN Pokhrel, Laxman, MD       acetaminophen  (TYLENOL ) tablet 650 mg  650 mg Oral Q6H PRN Pokhrel, Laxman, MD  Or   acetaminophen  (TYLENOL ) suppository 650 mg  650 mg Rectal Q6H PRN Pokhrel, Laxman, MD       cefTRIAXone  (ROCEPHIN ) 1 g in sodium chloride  0.9 % 100 mL IVPB  1 g Intravenous Q1400 Pokhrel, Laxman, MD       enoxaparin  (LOVENOX ) injection 30 mg  30 mg Subcutaneous Q24H Pokhrel, Laxman, MD   30 mg at 04/12/24 2253   escitalopram  (LEXAPRO ) tablet 10 mg  10 mg Oral Daily Pokhrel, Laxman, MD   10 mg at 04/13/24 0934   feeding supplement (ENSURE PLUS HIGH PROTEIN) liquid 237 mL  237 mL Oral BID BM Briana Elgin LABOR, MD       gabapentin  (NEURONTIN ) capsule 100 mg  100 mg Oral TID Briana Elgin LABOR, MD   100 mg at 04/13/24 1001   hydrALAZINE  (APRESOLINE ) injection 5 mg  5 mg Intravenous Q6H PRN Pokhrel, Laxman, MD   5 mg at 04/12/24 1759   megestrol  (MEGACE ) tablet 20 mg  20 mg Oral BID Pokhrel, Laxman, MD   20 mg at 04/13/24 0935   ondansetron  (ZOFRAN ) tablet 4 mg  4 mg Oral Q6H PRN Pokhrel, Laxman, MD       Or   ondansetron  (ZOFRAN ) injection 4 mg  4 mg Intravenous Q6H PRN Pokhrel, Laxman, MD       pantoprazole  (PROTONIX ) EC tablet 40 mg  40 mg Oral Daily Pokhrel, Laxman, MD   40 mg at 04/13/24 0934   potassium chloride  SA (KLOR-CON  M) CR tablet 40 mEq  40 mEq Oral Q4H Briana Elgin LABOR, MD   40 mEq at 04/13/24 0934   senna-docusate (Senokot-S) tablet 1 tablet  1 tablet Oral Daily PRN Pokhrel, Laxman, MD       sodium chloride  flush (NS) 0.9 % injection 3 mL  3 mL Intravenous Q12H Pokhrel, Laxman, MD   3 mL at 04/13/24 0935   sodium chloride  flush (NS) 0.9 % injection 3 mL  3 mL Intravenous PRN Pokhrel, Laxman, MD         Discharge Medications: Please see discharge summary for a list of discharge medications.  Relevant Imaging Results:  Relevant Lab Results:   Additional Information SSN [id number] [place]

## 2024-04-13 NOTE — TOC Initial Note (Signed)
 Transition of Care [facility]) - Initial/Assessment Note    Patient Details  Name: Elaine Rivera MRN: [id number] Date of Birth: 11/28/1945  Transition of Care [facility]) CM/SW Contact:    Sheri ONEIDA Sharps, LCSW Phone Number: 04/13/2024, 2:10 PM  Clinical Narrative:                 Pt from [facility]F. Pt recommended for SNF. Pt and pt dtr accepting of rec. Pt dtr states that [facility] would be pt first choice. PASRR obtained, FL2 completed and SNF referral faxed; awaiting bed offer.   Expected Discharge Plan: Skilled Nursing Facility Barriers to Discharge: Continued Medical Work up   Patient Goals and CMS Choice Patient states their goals for this hospitalization and ongoing recovery are:: Return to [facility]F following STR CMS Medicare.gov Compare Post Acute Care list provided to:: Patient Choice offered to / list presented to : Patient [place] ownership interest in [facility].provided to:: Patient    Expected Discharge Plan and Services In-house Referral: NA Discharge Planning Services: NA Post Acute Care Choice: Skilled Nursing Facility Living arrangements for the past 2 months: Assisted Living Facility                 DME Arranged: N/A DME Agency: NA       HH Arranged: NA HH Agency: NA        Prior Living Arrangements/Services Living arrangements for the past 2 months: Assisted Living Facility   Patient language and need for interpreter reviewed:: Yes Do you feel safe going back to the place where you live?: Yes      Need for Family Participation in Patient Care: Yes (Comment) Care giver support system in place?: Yes (comment)   Criminal Activity/Legal Involvement Pertinent to Current Situation/Hospitalization: No - Comment as needed  Activities of Daily Living      Permission Sought/Granted                  Emotional Assessment Appearance:: Appears stated age Attitude/Demeanor/Rapport: Engaged Affect (typically observed):  Accepting Orientation: : Oriented to Self, Oriented to Place, Oriented to  Time Alcohol  / Substance Use: Not Applicable Psych Involvement: No (comment)  Admission diagnosis:  Hypokalemia [E87.6] Lower urinary tract infectious disease [N39.0] Somnolence [R40.0] Altered mental status [R41.82] Patient Active Problem List   Diagnosis Date Noted   Altered mental status 04/12/2024   AMS (altered mental status) 01/20/2024   Back pain 01/20/2024   Accident due to mechanical fall without injury 12/24/2023   Symptomatic anemia 03/16/2023   Palliative care encounter 12/30/2021   FTT (failure to thrive) in adult 11/13/2021   GI bleed 11/13/2021   Acute lower GI bleeding 11/11/2021   Acute blood loss anemia 11/11/2021   Melena 11/11/2021   Seizure disorder (HCC) 10/27/2021   Protein-calorie malnutrition, severe 08/26/2021   Failure to thrive in adult 08/23/2021   Right 10th rib fracture 08/23/2021   Physical deconditioning 08/23/2021   Bad headache 03/14/2021   Seizure (HCC) 03/06/2021   CVA (cerebral vascular accident) (HCC) 03/06/2021   Ischemic cerebrovascular accident (CVA) (HCC) 03/05/2021   Hypokalemia 03/05/2021   Anxiety 03/05/2021   Transient speech disturbance 03/05/2021   Lung nodule 03/05/2021   Aphasia    Fall    Anemia 02/28/2020   Shortness of breath 01/24/2020   Dizziness 01/24/2020   Herpes 10/04/2018   Cerebral aneurysm 09/05/2018   NSAID long-term use 04/20/2016   Scoliosis 04/20/2016   Depression 04/20/2016   HTN (hypertension) 04/20/2016  GERD (gastroesophageal reflux disease) 04/20/2016   Occult GI bleeding 04/20/2016   PCP:  Patient, No Pcp Per Pharmacy:   [facility] - [place], [place] - [address] [place] [address] Phone: [phone] Fax: [phone]  [facility] - [place], [place] - [place] [address] [place] [address] Phone: [phone] Fax:  [phone]     Social Drivers of Health (SDOH) Social History: SDOH Screenings   Food Insecurity: No Food Insecurity (01/20/2024)  Housing: Low Risk  (01/20/2024)  Transportation Needs: No Transportation Needs (01/20/2024)  Utilities: Not At Risk (01/20/2024)  Social Connections: Socially Isolated (01/20/2024)  Tobacco Use: Medium Risk (04/12/2024)   SDOH Interventions:     Readmission Risk Interventions    04/13/2024    2:06 PM 11/13/2021    1:56 PM  Readmission Risk Prevention Plan  Transportation Screening Complete Complete  PCP or Specialist Appt within 5-7 Days Complete Complete  Home Care Screening Complete Complete  Medication Review (RN CM) Complete Complete

## 2024-04-13 NOTE — Progress Notes (Signed)
 PROGRESS NOTE    Elaine Rivera  FMW:[id number] DOB: 04/11/1946 DOA: 04/12/2024 PCP: Patient, No Pcp Per   Brief Narrative: Elaine Rivera is a 78 y.o. female with a history of anemia, depression, GERD, hypertension, malnutrition, stroke, dementia.  Patient presented secondary to altered mental status with concern for urinary tract infection.  Patient started empirically on antibiotics and urine culture pending.   Assessment and Plan:  Acute metabolic encephalopathy Appears to be secondary to acute UTI.  Mental status appears to have returned to baseline.  Urinary tract infection Present on admission.  Urinalysis suggests urinary tract infection.  Urine culture obtained.  Patient started empirically on ceftriaxone . - Continue ceftriaxone  - Follow-up urine culture  Hypokalemia Potassium of 2.2 on admission.  Patient managed with potassium supplementation. -Continue potassium supplementation - Repeat potassium  History of GERD - Continue Protonix   Primary hypertension Noted.  Patient is not on antihypertensive medications as an outpatient.  Chronic pain - Resume gabapentin   Depression - Continue Lexparo  Severe malnutrition - Continue megace  - Dietitian recommendations (7/17): Liberalize diet to Regular to avoid restricting intake.  Ensure Plus High Protein po BID, each supplement provides 350 kcal and 20 grams of protein. Magic cup BID with meals, each supplement provides 290 kcal and 9 grams of protein Encourage intake at all meals and of supplements.  Multivitamin with minerals daily. Monitor weight trends.   DVT prophylaxis: Lovenox  Code Status:   Code Status: Limited: Do not attempt resuscitation (DNR) -DNR-LIMITED -Do Not Intubate/DNI  Family Communication: Daughter on telephone Disposition Plan: Discharge to SNF likely in 1-2 days pending urine culture data and SNF bed availability   Consultants:  None  Procedures:   None  Antimicrobials: Ceftriaxone     Subjective: Patient reports no concerns today. Feels fine. Did not know why she was in the hospital.  Objective: BP (!) 144/87 (BP Location: Left Arm)   Pulse 81   Temp 98.2 F (36.8 C)   Resp 15   Ht 4' 11 (1.499 m)   Wt 40 kg   SpO2 99%   BMI 17.81 kg/m   Examination:  General exam: Appears calm and comfortable Respiratory system: Clear to auscultation. Respiratory effort normal. Cardiovascular system: S1 & S2 heard, RRR. No murmurs, rubs, gallops or clicks. Gastrointestinal system: Abdomen is nondistended, soft and nontender. Normal bowel sounds heard. Central nervous system: Alert and oriented to person and place. Psychiatry: Judgement and insight appear normal. Mood & affect appropriate.    Data Reviewed: I have personally reviewed following labs and imaging studies  CBC Lab Results  Component Value Date   WBC 9.9 04/13/2024   RBC 4.09 04/13/2024   HGB 12.4 04/13/2024   HCT 36.3 04/13/2024   MCV 88.8 04/13/2024   MCH 30.3 04/13/2024   PLT 149 (L) 04/13/2024   MCHC 34.2 04/13/2024   RDW 12.1 04/13/2024   LYMPHSABS 0.4 (L) 04/12/2024   MONOABS 0.8 04/12/2024   EOSABS 0.0 04/12/2024   BASOSABS 0.0 04/12/2024     Last metabolic panel Lab Results  Component Value Date   NA 138 04/13/2024   K 2.7 (LL) 04/13/2024   CL 103 04/13/2024   CO2 25 04/13/2024   BUN 24 (H) 04/13/2024   CREATININE 0.54 04/13/2024   GLUCOSE 111 (H) 04/13/2024   GFRNONAA >60 04/13/2024   GFRAA >60 03/01/2020   CALCIUM  9.1 04/13/2024   PHOS 2.7 01/22/2024   PROT 6.6 04/13/2024   ALBUMIN 2.9 (L) 04/13/2024   BILITOT 1.1 04/13/2024  ALKPHOS 61 04/13/2024   AST 13 (L) 04/13/2024   ALT 11 04/13/2024   ANIONGAP 10 04/13/2024    GFR: Estimated Creatinine Clearance: 36.6 mL/min (by C-G formula based on SCr of 0.54 mg/dL).  No results found for this or any previous visit (from the past 240 hours).    Radiology Studies: CT HEAD WO  CONTRAST Result Date: 04/12/2024 CLINICAL DATA:  78 year old female with unexplained altered mental status. History of partially thrombosed giant right PICA aneurysm. EXAM: CT HEAD WITHOUT CONTRAST TECHNIQUE: Contiguous axial images were obtained from the base of the skull through the vertex without intravenous contrast. RADIATION DOSE REDUCTION: This exam was performed according to the departmental dose-optimization program which includes automated exposure control, adjustment of the mA and/or kV according to patient size and/or use of iterative reconstruction technique. COMPARISON:  CTA head and neck 03/14/2021 and earlier. Head CT and brain MRI 01/20/2024 FINDINGS: Brain: Mixed density but predominantly hyperdense round, oval roughly 2 cm mass at the right lateral medulla, cisterna magna corresponds to chronic PICA aneurysm and is stable. Stable right medullary mass effect, but no brainstem or cerebellar edema identified. Stable background cerebral volume, nonspecific lateral and 3rd ventriculomegaly. No supratentorial midline shift or mass effect. Stable confluent periventricular white matter hypodensity. No acute intracranial hemorrhage identified. No cortically based acute infarct identified. Vascular: Chronic distal right vertebral artery vascular stent, tortuosity. No suspicious intracranial vascular hyperdensity. Skull: Chronic right suboccipital craniectomy. No acute osseous abnormality identified. Sinuses/Orbits: Chronic sphenoid sinus mucosal thickening and some bubbly opacity. Other paranasal sinuses and mastoids are stable and well aerated. Other: No acute orbit or scalp soft tissue finding. Chronic right suboccipital soft tissue scarring. IMPRESSION: 1. No acute intracranial abnormality. 2. 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 3. Chronic nonspecific supratentorial ventriculomegaly  and cerebral white matter disease. Electronically Signed   By: VEAR Hurst M.D.   On: 04/12/2024 13:15      LOS: 1 day    Elgin Lam, MD [facility] 04/13/2024, 5:11 PM   If 7PM-7AM, please contact night-coverage [email or web address]

## 2024-04-13 NOTE — Plan of Care (Signed)
  Problem: Education: Goal: Knowledge of General Education information will improve Description: Including pain rating scale, medication(s)/side effects and non-pharmacologic comfort measures Outcome: Progressing   Problem: Clinical Measurements: Goal: Ability to maintain clinical measurements within normal limits will improve Outcome: Progressing   Problem: Nutrition: Goal: Adequate nutrition will be maintained Outcome: Progressing   Problem: Pain Managment: Goal: General experience of comfort will improve and/or be controlled Outcome: Progressing

## 2024-04-13 NOTE — Plan of Care (Signed)

## 2024-04-13 NOTE — Evaluation (Addendum)
 Physical Therapy Evaluation Patient Details Name: Elaine Rivera MRN: [id number] DOB: [birth year] Today's Date: 04/13/2024  History of Present Illness  78 year old female presented to hospital from assisted living facility with complaints of altered mental status with difficulty arousing and took multiple attempts to wake her. Dx of UTI.  Pt with past medical history of anemia, depression, GERD, hypertension, malnutrition and stroke.  Clinical Impression  Pt admitted with above diagnosis. Pt reports she requires assistance to transfer to a WC at her ALF and that she hasn't walked in months, but does not recall why she stopped walking. Pt oriented to self and location, not to month/year. She is pleasant and can follow 1 step commands. Mod assist for supine to sit, once in sitting at edge of bed pt reported severe back and neck pain, which she stated is chronic. She stated she couldn't tolerate further activity so was assisted back to supine, pain medication requested.  Pt currently with functional limitations due to the deficits listed below (see PT Problem List). Pt will benefit from acute skilled PT to increase their independence and safety with mobility to allow discharge.           If plan is discharge home, recommend the following: A lot of help with walking and/or transfers;A lot of help with bathing/dressing/bathroom;Assistance with cooking/housework;Assist for transportation;Help with stairs or ramp for entrance;Direct supervision/assist for medications management;Direct supervision/assist for financial management   Can travel by private vehicle   No    Equipment Recommendations None recommended by PT  Recommendations for Other Services       Functional Status Assessment Patient has had a recent decline in their functional status and demonstrates the ability to make significant improvements in function in a reasonable and predictable amount of time.     Precautions / Restrictions  Precautions Precautions: Fall Recall of Precautions/Restrictions: Intact Restrictions Weight Bearing Restrictions Per Provider Order: No      Mobility  Bed Mobility Overal bed mobility: Needs Assistance Bed Mobility: Supine to Sit, Sit to Supine     Supine to sit: Mod assist Sit to supine: Min assist   General bed mobility comments: assist to raise trunk, then assist for BLEs back into bed. Pt reported severe back and neck pain once sitting at EOB and stated she needed to lie back ddwn. Pain meds requested.    Transfers                        Ambulation/Gait                  Stairs            Wheelchair Mobility     Tilt Bed    Modified Rankin (Stroke Patients Only)       Balance Overall balance assessment: Needs assistance Sitting-balance support: Feet supported, No upper extremity supported Sitting balance-Leahy Scale: Fair                                       Pertinent Vitals/Pain Pain Assessment Pain Assessment: Faces Faces Pain Scale: Hurts whole lot Pain Location: back and neck with movement Pain Descriptors / Indicators: Guarding, Grimacing Pain Intervention(s): Limited activity within patient's tolerance, Monitored during session, Repositioned, Patient requesting pain meds-RN notified    Home Living Family/patient expects to be discharged to:: Assisted living  Home Equipment: Scientist, research (medical) (4 wheels)      Prior Function Prior Level of Function : Needs assist;History of Falls (last six months);Patient poor historian/Family not available             Mobility Comments: Per PT Eval 01/22/24, pt and daughter reports multiple falls prior to that admission. Today Pt stated she requires assistance to transfer to a WC and that she hasn't walked in months. She did not know why she stopped walking. She stated she can help sometimes with self propelling WC. ADLs Comments:  assist needed     Extremity/Trunk Assessment        Lower Extremity Assessment Lower Extremity Assessment: Generalized weakness;LLE deficits/detail;RLE deficits/detail RLE Deficits / Details: pt tolerated heel slides AAROM, hip/knee AAROM appear WFL RLE: Unable to fully assess due to pain RLE Sensation: decreased light touch LLE Deficits / Details: pt tolerated heel slides AAROM, hip/knee AAROM appear WFL LLE: Unable to fully assess due to pain LLE Sensation: decreased light touch    Cervical / Trunk Assessment Cervical / Trunk Assessment: Kyphotic  Communication   Communication Communication: No apparent difficulties    Cognition Arousal: Alert Behavior During Therapy: WFL for tasks assessed/performed   PT - Cognitive impairments: No family/caregiver present to determine baseline, Memory                       PT - Cognition Comments: oriented to self and location, not oriented to month/year, pleasant, follows commands, vague historian Following commands: Intact       Cueing Cueing Techniques: Verbal cues     General Comments      Exercises  Heel slides x 10 both AAROM supine   Assessment/Plan    PT Assessment Patient needs continued PT services  PT Problem List Decreased activity tolerance;Decreased mobility       PT Treatment Interventions Functional mobility training;Therapeutic activities;Therapeutic exercise;Wheelchair mobility training;Balance training    PT Goals (Current goals can be found in the Care Plan section)  Acute Rehab PT Goals PT Goal Formulation: Patient unable to participate in goal setting Time For Goal Achievement: 04/27/24 Potential to Achieve Goals: Good    Frequency Min 2X/week     Co-evaluation               AM-PAC PT 6 Clicks Mobility  Outcome Measure Help needed turning from your back to your side while in a flat bed without using bedrails?: A Little Help needed moving from lying on your back to sitting  on the side of a flat bed without using bedrails?: A Lot Help needed moving to and from a bed to a chair (including a wheelchair)?: A Lot Help needed standing up from a chair using your arms (e.g., wheelchair or bedside chair)?: A Lot Help needed to walk in hospital room?: Total Help needed climbing 3-5 steps with a railing? : Total 6 Click Score: 11    End of Session   Activity Tolerance: Patient limited by pain Patient left: in bed;with bed alarm set;with call bell/phone within reach Nurse Communication: Mobility status;Patient requests pain meds PT Visit Diagnosis: Muscle weakness (generalized) (M62.81);Other abnormalities of gait and mobility (R26.89);Difficulty in walking, not elsewhere classified (R26.2)    Time: 0902-0920 PT Time Calculation (min) (ACUTE ONLY): 18 min   Charges:   PT Evaluation $PT Eval Moderate Complexity: 1 Mod   PT General Charges $$ ACUTE PT VISIT: 1 Visit        Sylvan Nest  [facility] 04/13/2024  Acute Rehabilitation Services  Office [phone]

## 2024-04-13 NOTE — Progress Notes (Signed)
 Initial Nutrition Assessment  DOCUMENTATION CODES:   Severe malnutrition in context of chronic illness  INTERVENTION:  - Liberalize diet to Regular to avoid restricting intake.  - Ensure Plus High Protein po BID, each supplement provides 350 kcal and 20 grams of protein. Magic cup BID with meals, each supplement provides 290 kcal and 9 grams of protein - Encourage intake at all meals and of supplements.  - Multivitamin with minerals daily. - Monitor weight trends.   NUTRITION DIAGNOSIS:   Severe Malnutrition related to chronic illness as evidenced by mild fat depletion, severe muscle depletion, percent weight loss (15% in 8.5 months).  GOAL:   Patient will meet greater than or equal to 90% of their needs, Weight gain  MONITOR:   PO intake, Supplement acceptance, Weight trends  REASON FOR ASSESSMENT:   Consult Assessment of nutrition requirement/status  ASSESSMENT:   78 years old female with PMH dementia, anemia, depression, GERD, HTN, malnutrition and stroke who presented from assisted living facility with complaints of altered mental status. Admitted for suspected metabolic encephalopathy from UTI.   Patient in bed at time of visit. Noted to be admitted for encephalopathy but patient answered all questions appropriately.  She reports a UBW in the 35s but shares she lost weight a few months ago. States it was due to her not having an appetite.  Per EMR, weight trended down from 98# in August 2024 to 84# in April. This is a  14# or 15% weight loss in 8.5 months, which is significant for the time frame. Thankfully, her weight this admission is 88#, showing possible weight gain over the past few months.   Patient endorses her intake varies, sometimes eats 3 meals and sometimes not. Has tried Ensure before but not drinking recently.   Patient is documented to have had 100% of her breakfast today. Currently on a heart diet, will liberalize to Regular to avoid restricting  intake.  Patient agreeable to try Ensure and Magic Cup to support intake, likes chocolate.    Medications reviewed and include: Megace   Labs reviewed:  K+ 2.7   NUTRITION - FOCUSED PHYSICAL EXAM:  Flowsheet Row Most Recent Value  Orbital Region Moderate depletion  Upper Arm Region No depletion  Thoracic and Lumbar Region Unable to assess  Buccal Region Mild depletion  Temple Region Severe depletion  Clavicle Bone Region Moderate depletion  Clavicle and Acromion Bone Region Severe depletion  Scapular Bone Region Unable to assess  Dorsal Hand Severe depletion  Patellar Region Moderate depletion  Anterior Thigh Region Moderate depletion  Posterior Calf Region Mild depletion  Edema (RD Assessment) None  Hair Reviewed  Eyes Reviewed  Mouth Reviewed  Skin Reviewed  Nails Reviewed    Diet Order:   Diet Order             Diet Heart Room service appropriate? Yes; Fluid consistency: Thin  Diet effective now                   EDUCATION NEEDS:  Education needs have been addressed  Skin:  Skin Assessment: Reviewed RN Assessment  Last BM:  7/17  Height:  Ht Readings from Last 1 Encounters:  04/12/24 4' 11 (1.499 m)   Weight:  Wt Readings from Last 1 Encounters:  04/12/24 40 kg   BMI:  Body mass index is 17.81 kg/m.  Estimated Nutritional Needs:  Kcal:  1600-1800 kcals Protein:  60-80 grams Fluid:  >/= 1.6L    Elaine Rivera RD, LDN Contact  via Secure Chat.

## 2024-04-13 NOTE — Evaluation (Signed)
 Occupational Therapy Evaluation Patient Details Name: Elaine Rivera MRN: [id number] DOB: 04/14/1946 Today's Date: 04/13/2024   History of Present Illness   78 year old female presented to hospital from assisted living facility with complaints of altered mental status with difficulty arousing and took multiple attempts to wake her. Dx of UTI.  Pt with past medical history of anemia, depression, GERD, hypertension, malnutrition and stroke.     Clinical Impressions The pt is currently presenting with the below listed deficits (see OT problem list). During the session, she required min assist for rolling, max assist for simulated lower body dressing, and mod assist for supine to sit. Once seated EOB, she reported having increased back pain and further demonstrated guarding of movements and posterior leaning; she reported pain to be chronic in nature. She was unable to progress to attempting out of bed activity. She will benefit from further OT services to maximize her ADL performance & to decrease the risk for further weakness and deconditioning. Patient will benefit from continued inpatient follow up therapy, <3 hours/day.      If plan is discharge home, recommend the following:   A lot of help with walking and/or transfers;A lot of help with bathing/dressing/bathroom;Direct supervision/assist for medications management;Direct supervision/assist for financial management     Functional Status Assessment   Patient has had a recent decline in their functional status and demonstrates the ability to make significant improvements in function in a reasonable and predictable amount of time.     Equipment Recommendations   Other (comment) (defer to next level of care)     Recommendations for Other Services         Precautions/Restrictions   Precautions Precautions: Fall Restrictions Weight Bearing Restrictions Per Provider Order: No     Mobility Bed Mobility Overal bed  mobility: Needs Assistance Bed Mobility: Supine to Sit, Sit to Supine, Rolling Rolling: Min assist   Supine to sit: Mod assist Sit to supine: Mod assist        Transfers        General transfer comment: unable to assess      Balance     Sitting balance-Leahy Scale: Poor Sitting balance - Comments: limited by back pain and associated guarding of movements       Standing balance comment: unable to assess           ADL either performed or assessed with clinical judgement   ADL Overall ADL's : Needs assistance/impaired Eating/Feeding: Set up;Bed level   Grooming: Set up;Supervision/safety;Bed level           Upper Body Dressing : Minimal assistance;Bed level   Lower Body Dressing: Maximal assistance;Bed level       Toileting- Clothing Manipulation and Hygiene: Maximal assistance;Bed level               Vision   Additional Comments: She correctly read the time depicted on the wall clock.            Pertinent Vitals/Pain Pain Assessment Pain Assessment: Faces Pain Score: 5  Pain Location: back and neck with movement Pain Descriptors / Indicators: Guarding, Grimacing Pain Intervention(s): Limited activity within patient's tolerance, Monitored during session, Repositioned     Extremity/Trunk Assessment Upper Extremity Assessment Upper Extremity Assessment: RUE deficits/detail;LUE deficits/detail;Right hand dominant;Generalized weakness RUE Deficits / Details: AROM WFL. Grip strength grossly 4-/5 LUE Deficits / Details: AROM WFL. Grip strength grossly 4-/5   Lower Extremity Assessment Lower Extremity Assessment: Generalized weakness RLE Deficits / Details:  (  AROM to AAROM WFL) LLE Deficits / Details:  (AROM to [facility])       Communication Communication Communication: No apparent difficulties   Cognition Arousal: Alert Behavior During Therapy: WFL for tasks assessed/performed Cognition: History of cognitive impairments   Orientation  impairments: Time, Situation   Memory impairment (select all impairments): Short-term memory, Declarative long-term memory Attention impairment (select first level of impairment): Alternating attention   OT - Cognition Comments: Per her medical chart, she has a history of dementia. She was oriented to person and place, but disoriented to time and situation. She could follow simple 1 step commands. Memory impairment noted.                                    Home Living Family/patient expects to be discharged to:: Assisted living      Home Equipment: Wheelchair - manual   Additional Comments: The pt is from [place], ALF per her medical chart      Prior Functioning/Environment Prior Level of Function : Needs assist;Patient poor historian/Family not available             Mobility Comments:  (She reported using a wheelchair and needing assistance to transfer into and out of the wheelchair. She could propel the wheelchair, sometimes. She stated she has not ambulated in a while.) ADLs Comments:  (She reported needing assistance for toileting, bathing, and occasionally for dressing.)    OT Problem List: Decreased strength;Decreased activity tolerance;Impaired balance (sitting and/or standing);Decreased cognition;Pain   OT Treatment/Interventions: Self-care/ADL training;Therapeutic exercise;Energy conservation;DME and/or AE instruction;Balance training;Patient/family education;Therapeutic activities      OT Goals(Current goals can be found in the care plan section)   Acute Rehab OT Goals OT Goal Formulation: With patient Time For Goal Achievement: 04/27/24 Potential to Achieve Goals: Good ADL Goals Pt Will Perform Upper Body Dressing: with supervision;with set-up;sitting Pt Will Transfer to Toilet: with contact guard assist;squat pivot transfer;with transfer board;bedside commode Additional ADL Goal #1: The pt will perform bed mobility with CGA, in prep for  progressive ADL participation.   OT Frequency:  Min 2X/week       AM-PAC OT 6 Clicks Daily Activity     Outcome Measure Help from another person eating meals?: A Little Help from another person taking care of personal grooming?: A Little Help from another person toileting, which includes using toliet, bedpan, or urinal?: A Lot Help from another person bathing (including washing, rinsing, drying)?: A Lot Help from another person to put on and taking off regular upper body clothing?: A Little Help from another person to put on and taking off regular lower body clothing?: A Lot 6 Click Score: 15   End of Session Equipment Utilized During Treatment: Other (comment) (N/A) Nurse Communication: Mobility status  Activity Tolerance: Patient limited by pain Patient left: in bed;with call bell/phone within reach;with bed alarm set  OT Visit Diagnosis: Other abnormalities of gait and mobility (R26.89);Muscle weakness (generalized) (M62.81);Other symptoms and signs involving cognitive function;Pain Pain - part of body:  (back)                Time: 8889-8874 OT Time Calculation (min): 15 min Charges:  OT General Charges $OT Visit: 1 Visit OT Evaluation $OT Eval Moderate Complexity: 1 Mod    Jayvion Stefanski L Whitni Pasquini, OTR/L 04/13/2024, 1:42 PM

## 2024-04-14 DIAGNOSIS — N390 Urinary tract infection, site not specified: Secondary | ICD-10-CM | POA: Diagnosis not present

## 2024-04-14 DIAGNOSIS — R4182 Altered mental status, unspecified: Secondary | ICD-10-CM | POA: Diagnosis not present

## 2024-04-14 LAB — BASIC METABOLIC PANEL WITH GFR
Anion gap: 11 (ref 5–15)
BUN: 15 mg/dL (ref 8–23)
CO2: 21 mmol/L — ABNORMAL LOW (ref 22–32)
Calcium: 8.9 mg/dL (ref 8.9–10.3)
Chloride: 102 mmol/L (ref 98–111)
Creatinine, Ser: 0.4 mg/dL — ABNORMAL LOW (ref 0.44–1.00)
GFR, Estimated: >60 mL/min (ref >60)
Glucose, Bld: 104 mg/dL — ABNORMAL HIGH (ref 70–99)
Potassium: 3.6 mmol/L (ref 3.5–5.1)
Sodium: 134 mmol/L — ABNORMAL LOW (ref 135–145)

## 2024-04-14 LAB — URINE CULTURE: Culture: >=100000 — AB

## 2024-04-14 NOTE — Plan of Care (Signed)

## 2024-04-14 NOTE — Care Management Obs Status (Deleted)
 MEDICARE OBSERVATION STATUS NOTIFICATION   Patient Details  Name: Elaine Rivera MRN: [id number] Date of Birth: [birth year]   Medicare Observation Status Notification Given:  Yes    Duwaine GORMAN Aran, LCSW 04/14/2024, 4:02 PM

## 2024-04-14 NOTE — Care Management CC44 (Signed)
 Condition Code 44 Documentation Completed  Patient Details  Name: Elaine Rivera MRN: [id number] Date of Birth: [birth year]   Condition Code 44 given:  Yes Patient signature on Condition Code 44 notice:  Yes Documentation of 2 MD's agreement:  Yes Code 44 added to claim:  Yes    Duwaine GORMAN Aran, LCSW 04/14/2024, 4:02 PM

## 2024-04-14 NOTE — Care Management Obs Status (Signed)
 MEDICARE OBSERVATION STATUS NOTIFICATION   Patient Details  Name: KENDA KLOEHN MRN: [id number] Date of Birth: [birth year]   Medicare Observation Status Notification Given:  Other (see comment) (Mailed to daughter: [address] [place], [place] [address])    Duwaine GORMAN Aran, LCSW 04/14/2024, 4:08 PM

## 2024-04-14 NOTE — TOC Progression Note (Signed)
 Transition of Care [facility]) - Progression Note    Patient Details  Name: Elaine Rivera MRN: [id number] Date of Birth: 1946/02/17  Transition [facility]) CM/SW Contact  Sheri ONEIDA [place], [place] Phone Number: 04/14/2024, 10:13 AM  Clinical Narrative:    [facility] bed offer accepted and ins auth started; pending approval.    Expected Discharge Plan: Skilled Nursing Facility Barriers to Discharge: Continued Medical Work up  Expected Discharge Plan and Services In-house Referral: NA Discharge Planning Services: NA Post Acute Care Choice: Skilled Nursing Facility Living arrangements for the past 2 months: Assisted Living Facility                 DME Arranged: N/A DME Agency: NA       HH Arranged: NA HH Agency: NA         Social Determinants of Health (SDOH) Interventions SDOH Screenings   Food Insecurity: No Food Insecurity (01/20/2024)  Housing: Low Risk  (01/20/2024)  Transportation Needs: No Transportation Needs (01/20/2024)  Utilities: Not At Risk (01/20/2024)  Social Connections: Socially Isolated (01/20/2024)  Tobacco Use: Medium Risk (04/12/2024)    Readmission Risk Interventions    04/13/2024    2:06 PM 11/13/2021    1:56 PM  Readmission Risk Prevention Plan  Transportation Screening Complete Complete  PCP or Specialist Appt within 5-7 Days Complete Complete  Home Care Screening Complete Complete  Medication Review (RN CM) Complete Complete

## 2024-04-14 NOTE — Progress Notes (Signed)
 Physical Therapy Treatment Patient Details Name: Elaine Rivera MRN: [id number] DOB: September 19, 1946 Today's Date: 04/14/2024   History of Present Illness 78 year old female presented to hospital from assisted living facility with complaints of altered mental status with difficulty arousing and took multiple attempts to wake her. Dx of UTI.  Pt with past medical history of anemia, depression, GERD, hypertension, malnutrition and stroke.    PT Comments  Mod assist for supine to sit, pt tolerated ~2.5 minutes sitting edge of bed with kyphotic posture, she performed seated BLE exercises and forward reaching activity. Sitting tolerance limited by back/neck pain. Pt assisted back to supine and performed BUE/LE exercises with assist for technique.     If plan is discharge home, recommend the following: A lot of help with walking and/or transfers;A lot of help with bathing/dressing/bathroom;Assistance with cooking/housework;Assist for transportation;Help with stairs or ramp for entrance;Direct supervision/assist for medications management;Direct supervision/assist for financial management   Can travel by private vehicle     No  Equipment Recommendations  None recommended by PT    Recommendations for Other Services       Precautions / Restrictions Precautions Precautions: Fall Recall of Precautions/Restrictions: Intact Restrictions Weight Bearing Restrictions Per Provider Order: No     Mobility  Bed Mobility Overal bed mobility: Needs Assistance Bed Mobility: Supine to Sit, Sit to Supine, Rolling Rolling: Min assist   Supine to sit: Mod assist Sit to supine: Mod assist   General bed mobility comments: assist to raise trunk, then assist for BLEs back into bed. Pt reported severe back and neck pain once sitting at EOB and stated she needed to lie back down after ~2.5 minutes seated. Performed seated knee extension AROM and forward reaching activity. Pain meds requested.    Transfers                    General transfer comment: unable to assess    Ambulation/Gait                   Stairs             Wheelchair Mobility     Tilt Bed    Modified Rankin (Stroke Patients Only)       Balance   Sitting-balance support: Feet supported, No upper extremity supported Sitting balance-Leahy Scale: Poor Sitting balance - Comments: limited by back pain and associated guarding of movements; significant kyphosis noted; required min A to supervision for static sitting balance without UE support Postural control: Posterior lean     Standing balance comment: unable to assess                            Communication Communication Communication: No apparent difficulties  Cognition Arousal: Alert Behavior During Therapy: WFL for tasks assessed/performed   PT - Cognitive impairments: No family/caregiver present to determine baseline, Memory                       PT - Cognition Comments: oriented to self and location, not oriented to month/year, pleasant, follows commands, vague historian Following commands: Intact      Cueing Cueing Techniques: Verbal cues, Tactile cues  Exercises General Exercises - Lower Extremity Ankle Circles/Pumps: AAROM, Both, 10 reps, Supine Long Arc Quad: AROM, Both, 10 reps, Seated Heel Slides: AAROM, Both, 10 reps, Supine Shoulder Exercises Shoulder Flexion: AROM, Both, 10 reps, Supine    General Comments  Pertinent Vitals/Pain Pain Assessment Pain Score: 5  Pain Location: back and neck with movement Pain Descriptors / Indicators: Guarding, Grimacing Pain Intervention(s): Limited activity within patient's tolerance, Monitored during session, Repositioned    Home Living                          Prior Function            PT Goals (current goals can now be found in the care plan section) Acute Rehab PT Goals PT Goal Formulation: Patient unable to participate in goal  setting Time For Goal Achievement: 04/27/24 Potential to Achieve Goals: Good Progress towards PT goals: Progressing toward goals    Frequency    Min 2X/week      PT Plan      Co-evaluation              AM-PAC PT 6 Clicks Mobility   Outcome Measure  Help needed turning from your back to your side while in a flat bed without using bedrails?: A Little Help needed moving from lying on your back to sitting on the side of a flat bed without using bedrails?: A Lot Help needed moving to and from a bed to a chair (including a wheelchair)?: Total Help needed standing up from a chair using your arms (e.g., wheelchair or bedside chair)?: Total Help needed to walk in hospital room?: Total Help needed climbing 3-5 steps with a railing? : Total 6 Click Score: 9    End of Session Equipment Utilized During Treatment: Gait belt Activity Tolerance: Patient limited by pain;Patient limited by fatigue Patient left: in bed;with bed alarm set;with call bell/phone within reach Nurse Communication: Mobility status;Patient requests pain meds PT Visit Diagnosis: Muscle weakness (generalized) (M62.81);Other abnormalities of gait and mobility (R26.89);Difficulty in walking, not elsewhere classified (R26.2)     Time: 8550-8497 PT Time Calculation (min) (ACUTE ONLY): 13 min  Charges:    $Therapeutic Activity: 8-22 mins PT General Charges $$ ACUTE PT VISIT: 1 Visit                     Arman Delon Copp PT 04/14/2024  Acute Rehabilitation Services  Office [phone]

## 2024-04-14 NOTE — Progress Notes (Signed)
 PROGRESS NOTE    Elaine Rivera  FMW:[id number] DOB: [birth year] DOA: 04/12/2024 PCP: Patient, No Pcp Per   Brief Narrative: LEONETTE TISCHER is a 78 y.o. female with a history of anemia, depression, GERD, hypertension, malnutrition, stroke, dementia.  Patient presented secondary to altered mental status with concern for urinary tract infection.  Patient started empirically on antibiotics and urine culture pending.   Assessment and Plan:  Acute metabolic encephalopathy Appears to be secondary to acute UTI.  Mental status appears to have returned to baseline.  Urinary tract infection Present on admission.  Urinalysis suggests urinary tract infection.  Urine culture obtained.  Patient started empirically on ceftriaxone . Urine culture significant for Klebsiella pneumoniae with resistance to ampicillin and nitrofurantoin. - Continue ceftriaxone  and complete 3 days of treatment  Hypokalemia Potassium of 2.2 on admission.  Patient managed with potassium supplementation. Resolved.  History of GERD - Continue Protonix   Primary hypertension Noted.  Patient is not on antihypertensive medications as an outpatient.  Chronic pain - Continue gabapentin   Depression - Continue Lexparo  Severe malnutrition - Continue megace  - Dietitian recommendations (7/17): Liberalize diet to Regular to avoid restricting intake.  Ensure Plus High Protein po BID, each supplement provides 350 kcal and 20 grams of protein. Magic cup BID with meals, each supplement provides 290 kcal and 9 grams of protein Encourage intake at all meals and of supplements.  Multivitamin with minerals daily. Monitor weight trends.   DVT prophylaxis: Lovenox  Code Status:   Code Status: Limited: Do not attempt resuscitation (DNR) -DNR-LIMITED -Do Not Intubate/DNI  Family Communication: None at bedside Disposition Plan: Discharge to SNF pending bed availability. Medically stable for discharge.   Consultants:   None  Procedures:  None  Antimicrobials: Ceftriaxone     Subjective: No concerns this morning.  Objective: BP (!) 133/90 (BP Location: Right Arm)   Pulse 75   Temp 98.4 F (36.9 C)   Resp 15   Ht 4' 11 (1.499 m)   Wt 41 kg   SpO2 95%   BMI 18.26 kg/m   Examination:  General exam: Appears calm and comfortable Respiratory system: Clear to auscultation. Respiratory effort normal. Cardiovascular system: S1 & S2 heard, RRR. No murmurs, rubs, gallops or clicks. Gastrointestinal system: Abdomen is nondistended, soft and nontender. Normal bowel sounds heard. Central nervous system: Alert and oriented to person and place. No focal neurological deficits.   Data Reviewed: I have personally reviewed following labs and imaging studies  CBC Lab Results  Component Value Date   WBC 9.9 04/13/2024   RBC 4.09 04/13/2024   HGB 12.4 04/13/2024   HCT 36.3 04/13/2024   MCV 88.8 04/13/2024   MCH 30.3 04/13/2024   PLT 149 (L) 04/13/2024   MCHC 34.2 04/13/2024   RDW 12.1 04/13/2024   LYMPHSABS 0.4 (L) 04/12/2024   MONOABS 0.8 04/12/2024   EOSABS 0.0 04/12/2024   BASOSABS 0.0 04/12/2024     Last metabolic panel Lab Results  Component Value Date   NA 134 (L) 04/14/2024   K 3.6 04/14/2024   CL 102 04/14/2024   CO2 21 (L) 04/14/2024   BUN 15 04/14/2024   CREATININE 0.40 (L) 04/14/2024   GLUCOSE 104 (H) 04/14/2024   GFRNONAA >60 04/14/2024   GFRAA >60 03/01/2020   CALCIUM  8.9 04/14/2024   PHOS 2.7 01/22/2024   PROT 6.6 04/13/2024   ALBUMIN 2.9 (L) 04/13/2024   BILITOT 1.1 04/13/2024   ALKPHOS 61 04/13/2024   AST 13 (L) 04/13/2024   ALT  11 04/13/2024   ANIONGAP 11 04/14/2024    GFR: Estimated Creatinine Clearance: 37.5 mL/min (A) (by C-G formula based on SCr of 0.4 mg/dL (L)).  Recent Results (from the past 240 hours)  Urine Culture (for pregnant, neutropenic or urologic patients or patients with an indwelling urinary catheter)     Status: Abnormal   Collection  Time: 04/12/24  1:19 PM   Specimen: Urine, Clean Catch  Result Value Ref Range Status   Specimen Description   Final    URINE, CLEAN CATCH Performed at [facility], [address], [place], [place] [address]    Special Requests   Final    NONE Performed at [facility], [address] [place], [place] [address]    Culture >=100,000 COLONIES/mL KLEBSIELLA PNEUMONIAE (A)  Final   Report Status 04/14/2024 FINAL  Final   Organism ID, Bacteria KLEBSIELLA PNEUMONIAE (A)  Final      Susceptibility   Klebsiella pneumoniae - MIC*    AMPICILLIN >=32 RESISTANT Resistant     CEFAZOLIN  <=4 SENSITIVE Sensitive     CEFEPIME <=0.12 SENSITIVE Sensitive     CEFTRIAXONE  <=0.25 SENSITIVE Sensitive     CIPROFLOXACIN  <=0.25 SENSITIVE Sensitive     GENTAMICIN  <=1 SENSITIVE Sensitive     IMIPENEM <=0.25 SENSITIVE Sensitive     NITROFURANTOIN 128 RESISTANT Resistant     TRIMETH/SULFA <=20 SENSITIVE Sensitive     AMPICILLIN/SULBACTAM 4 SENSITIVE Sensitive     PIP/TAZO <=4 SENSITIVE Sensitive ug/mL    * >=100,000 COLONIES/mL KLEBSIELLA PNEUMONIAE      Radiology Studies: CT HEAD WO CONTRAST Result Date: 04/12/2024 CLINICAL DATA:  78 year old female with unexplained altered mental status. History of partially thrombosed giant right PICA aneurysm. EXAM: CT HEAD WITHOUT CONTRAST TECHNIQUE: Contiguous axial images were obtained from the base of the skull through the vertex without intravenous contrast. RADIATION DOSE REDUCTION: This exam was performed according to the departmental dose-optimization program which includes automated exposure control, adjustment of the mA and/or kV according to patient size and/or use of iterative reconstruction technique. COMPARISON:  CTA head and neck 03/14/2021 and earlier. Head CT and brain MRI 01/20/2024 FINDINGS: Brain: Mixed density but predominantly hyperdense round, oval roughly 2 cm mass at the right lateral medulla, cisterna magna  corresponds to chronic PICA aneurysm and is stable. Stable right medullary mass effect, but no brainstem or cerebellar edema identified. Stable background cerebral volume, nonspecific lateral and 3rd ventriculomegaly. No supratentorial midline shift or mass effect. Stable confluent periventricular white matter hypodensity. No acute intracranial hemorrhage identified. No cortically based acute infarct identified. Vascular: Chronic distal right vertebral artery vascular stent, tortuosity. No suspicious intracranial vascular hyperdensity. Skull: Chronic right suboccipital craniectomy. No acute osseous abnormality identified. Sinuses/Orbits: Chronic sphenoid sinus mucosal thickening and some bubbly opacity. Other paranasal sinuses and mastoids are stable and well aerated. Other: No acute orbit or scalp soft tissue finding. Chronic right suboccipital soft tissue scarring. IMPRESSION: 1. No acute intracranial abnormality. 2. 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 3. Chronic nonspecific supratentorial ventriculomegaly and cerebral white matter disease. Electronically Signed   By: VEAR Hurst M.D.   On: 04/12/2024 13:15      LOS: 2 days    Elgin Lam, MD [facility] 04/14/2024, 9:57 AM   If 7PM-7AM, please contact night-coverage [email or web address]

## 2024-04-15 DIAGNOSIS — N390 Urinary tract infection, site not specified: Secondary | ICD-10-CM | POA: Diagnosis not present

## 2024-04-15 DIAGNOSIS — R4182 Altered mental status, unspecified: Secondary | ICD-10-CM | POA: Diagnosis not present

## 2024-04-15 MED ORDER — TRAZODONE HCL 50 MG PO TABS
25.0000 mg | ORAL_TABLET | Freq: Every evening | ORAL | Status: DC | PRN
  Filled 2024-04-15: qty 1

## 2024-04-15 NOTE — Plan of Care (Signed)

## 2024-04-15 NOTE — Progress Notes (Addendum)
 PROGRESS NOTE    Elaine Rivera  FMW:[id number] DOB: 10-May-1946 DOA: 04/12/2024 PCP: Patient, No Pcp Per   Brief Narrative: Elaine Rivera is a 78 y.o. female with a history of anemia, depression, GERD, hypertension, malnutrition, stroke, dementia.  Patient presented secondary to altered mental status with concern for urinary tract infection.  Patient started empirically on antibiotics and urine culture significant for Klebsiella pneumoniae. Patient completed antibiotic treatment course.   Assessment and Plan:  Acute metabolic encephalopathy Appears to be secondary to acute UTI.  Mental status appears to have returned to baseline.  Urinary tract infection Present on admission.  Urinalysis suggests urinary tract infection.  Urine culture obtained.  Patient started empirically on ceftriaxone . Urine culture significant for Klebsiella pneumoniae with resistance to ampicillin and nitrofurantoin. Completed a 3-day course of Ceftriaxone .  Hypokalemia Potassium of 2.2 on admission.  Patient managed with potassium supplementation. Resolved.  History of GERD - Continue Protonix   Primary hypertension Noted.  Patient is not on antihypertensive medications as an outpatient.  Chronic pain - Continue gabapentin   Depression - Continue Lexparo  Severe malnutrition - Continue megace  - Dietitian recommendations (7/17): Liberalize diet to Regular to avoid restricting intake.  Ensure Plus High Protein po BID, each supplement provides 350 kcal and 20 grams of protein. Magic cup BID with meals, each supplement provides 290 kcal and 9 grams of protein Encourage intake at all meals and of supplements.  Multivitamin with minerals daily. Monitor weight trends.   DVT prophylaxis: Lovenox  Code Status:   Code Status: Limited: Do not attempt resuscitation (DNR) -DNR-LIMITED -Do Not Intubate/DNI  Family Communication: None at bedside Disposition Plan: Discharge to SNF pending bed  availability. Medically stable for discharge.   Consultants:  None  Procedures:  None  Antimicrobials: Ceftriaxone     Subjective: No issues noted from overnight events.  Objective: BP (!) 136/93 (BP Location: Left Arm)   Pulse 84   Temp 98.4 F (36.9 C)   Resp 15   Ht 4' 11 (1.499 m)   Wt 41.2 kg   SpO2 94%   BMI 18.35 kg/m   Examination:  General exam: Appears calm and comfortable Respiratory system: Clear to auscultation. Respiratory effort normal. Cardiovascular system: S1 & S2 heard, RRR. No murmurs, rubs, gallops or clicks. Gastrointestinal system: Abdomen is nondistended, soft and nontender. Normal bowel sounds heard. Central nervous system: Alert and oriented. No focal neurological deficits. Psychiatry: Judgement and insight appear normal. Mood & affect appropriate.    Data Reviewed: I have personally reviewed following labs and imaging studies  CBC Lab Results  Component Value Date   WBC 9.9 04/13/2024   RBC 4.09 04/13/2024   HGB 12.4 04/13/2024   HCT 36.3 04/13/2024   MCV 88.8 04/13/2024   MCH 30.3 04/13/2024   PLT 149 (L) 04/13/2024   MCHC 34.2 04/13/2024   RDW 12.1 04/13/2024   LYMPHSABS 0.4 (L) 04/12/2024   MONOABS 0.8 04/12/2024   EOSABS 0.0 04/12/2024   BASOSABS 0.0 04/12/2024     Last metabolic panel Lab Results  Component Value Date   NA 134 (L) 04/14/2024   K 3.6 04/14/2024   CL 102 04/14/2024   CO2 21 (L) 04/14/2024   BUN 15 04/14/2024   CREATININE 0.40 (L) 04/14/2024   GLUCOSE 104 (H) 04/14/2024   GFRNONAA >60 04/14/2024   GFRAA >60 03/01/2020   CALCIUM  8.9 04/14/2024   PHOS 2.7 01/22/2024   PROT 6.6 04/13/2024   ALBUMIN 2.9 (L) 04/13/2024   BILITOT 1.1 04/13/2024  ALKPHOS 61 04/13/2024   AST 13 (L) 04/13/2024   ALT 11 04/13/2024   ANIONGAP 11 04/14/2024    GFR: Estimated Creatinine Clearance: 37.7 mL/min (A) (by C-G formula based on SCr of 0.4 mg/dL (L)).  Recent Results (from the past 240 hours)  Urine  Culture (for pregnant, neutropenic or urologic patients or patients with an indwelling urinary catheter)     Status: Abnormal   Collection Time: 04/12/24  1:19 PM   Specimen: Urine, Clean Catch  Result Value Ref Range Status   Specimen Description   Final    URINE, CLEAN CATCH Performed at [facility], [address], [place], [place] [address]    Special Requests   Final    NONE Performed at [facility], [address], [place], [place] [address]    Culture >=100,000 COLONIES/mL KLEBSIELLA PNEUMONIAE (A)  Final   Report Status 04/14/2024 FINAL  Final   Organism ID, Bacteria KLEBSIELLA PNEUMONIAE (A)  Final      Susceptibility   Klebsiella pneumoniae - MIC*    AMPICILLIN >=32 RESISTANT Resistant     CEFAZOLIN  <=4 SENSITIVE Sensitive     CEFEPIME <=0.12 SENSITIVE Sensitive     CEFTRIAXONE  <=0.25 SENSITIVE Sensitive     CIPROFLOXACIN  <=0.25 SENSITIVE Sensitive     GENTAMICIN  <=1 SENSITIVE Sensitive     IMIPENEM <=0.25 SENSITIVE Sensitive     NITROFURANTOIN 128 RESISTANT Resistant     TRIMETH/SULFA <=20 SENSITIVE Sensitive     AMPICILLIN/SULBACTAM 4 SENSITIVE Sensitive     PIP/TAZO <=4 SENSITIVE Sensitive ug/mL    * >=100,000 COLONIES/mL KLEBSIELLA PNEUMONIAE      Radiology Studies: No results found.     LOS: 2 days    Elgin Lam, MD [facility] 04/15/2024, 11:06 AM   If 7PM-7AM, please contact night-coverage [email or web address]

## 2024-04-15 NOTE — Plan of Care (Signed)
  Problem: Education: Goal: Knowledge of General Education information will improve Description: Including pain rating scale, medication(s)/side effects and non-pharmacologic comfort measures Outcome: Progressing   Problem: Clinical Measurements: Goal: Ability to maintain clinical measurements within normal limits will improve Outcome: Progressing Goal: Will remain free from infection Outcome: Progressing Goal: Diagnostic test results will improve Outcome: Progressing Goal: Respiratory complications will improve Outcome: Progressing Goal: Cardiovascular complication will be avoided Outcome: Progressing   Problem: Nutrition: Goal: Adequate nutrition will be maintained Outcome: Progressing   Problem: Coping: Goal: Level of anxiety will decrease Outcome: Progressing   Problem: Elimination: Goal: Will not experience complications related to bowel motility Outcome: Progressing Goal: Will not experience complications related to urinary retention Outcome: Progressing   Problem: Pain Managment: Goal: General experience of comfort will improve and/or be controlled Outcome: Progressing   Problem: Skin Integrity: Goal: Risk for impaired skin integrity will decrease Outcome: Progressing   Problem: Health Behavior/Discharge Planning: Goal: Ability to manage health-related needs will improve Outcome: Not Progressing   Problem: Activity: Goal: Risk for activity intolerance will decrease Outcome: Not Progressing   Problem: Safety: Goal: Ability to remain free from injury will improve Outcome: Not Progressing

## 2024-04-16 DIAGNOSIS — N390 Urinary tract infection, site not specified: Secondary | ICD-10-CM | POA: Diagnosis not present

## 2024-04-16 MED ORDER — ADULT MULTIVITAMIN W/MINERALS CH: 1.0000 | ORAL_TABLET | Freq: Every day | ORAL | Status: AC

## 2024-04-16 MED ORDER — ENSURE PLUS HIGH PROTEIN PO LIQD: 237.0000 mL | Freq: Two times a day (BID) | ORAL | Status: AC

## 2024-04-16 NOTE — TOC Progression Note (Signed)
 Transition of Care [facility]) - Progression Note    Patient Details  Name: Elaine Rivera MRN: [id number] Date of Birth: Jul 30, 1946  Transition of [facility]) CM/SW Contact  [name], LCSW Phone Number: 04/16/2024, 9:41 AM  Clinical Narrative:    Pt's insurance authorization for SNF rehab at [facility] has been approved. Auth ID:  [id number]. [place] [facility] notified. TOC will continue to follow.   Expected Discharge Plan: Skilled Nursing Facility Barriers to Discharge: Continued Medical Work up  Expected Discharge Plan and Services In-house Referral: NA Discharge Planning Services: NA Post Acute Care Choice: Skilled Nursing Facility Living arrangements for the past 2 months: Assisted Living Facility                 DME Arranged: N/A DME Agency: NA       HH Arranged: NA HH Agency: NA         Social Determinants of Health (SDOH) Interventions SDOH Screenings   Food Insecurity: No Food Insecurity (04/14/2024)  Housing: Low Risk  (04/14/2024)  Transportation Needs: No Transportation Needs (04/14/2024)  Utilities: Not At Risk (04/14/2024)  Social Connections: Socially Isolated (04/14/2024)  Tobacco Use: Medium Risk (04/12/2024)    Readmission Risk Interventions    04/13/2024    2:06 PM 11/13/2021    1:56 PM  Readmission Risk Prevention Plan  Transportation Screening Complete Complete  PCP or Specialist Appt within 5-7 Days Complete Complete  Home Care Screening Complete Complete  Medication Review (RN CM) Complete Complete

## 2024-04-16 NOTE — Progress Notes (Signed)
 Called report to [place] at [facility]. Bernadette aware patient is going to [id number] and that [facility] will be called once DNR is signed. Call back number given if any questions arise.

## 2024-04-16 NOTE — Discharge Instructions (Signed)
 Elaine Rivera,  You were in the hospital with a UTI. This has been successfully treated. Please follow-up with your primary care physician. You will need rehab before going back to your assisted living.

## 2024-04-16 NOTE — TOC Transition Note (Signed)
 Transition of Care [facility]) - Discharge Note   Patient Details  Name: Elaine Rivera MRN: [id number] Date of Birth: 05-03-1946  Transition of Care [facility]) CM/SW Contact:  [name], LCSW Phone Number: 04/16/2024, 11:32 AM   Clinical Narrative:    Pt to discharge to [facility] today short short-term SNF rehab. Pt accepted to room 610A. D/C packet with signed DNR placed in pt's chart at RN station. RN to call report to [phone]. PTAR called at 12:45 PM. Pt and daughter in agreement with discharge plan. No further TOC needs at this time.   Final next level of care: Skilled Nursing Facility Barriers to Discharge: Barriers Resolved   Patient Goals and CMS Choice Patient states their goals for this hospitalization and ongoing recovery are:: To go to [facility] for SNF rehab CMS Medicare.gov Compare Post Acute Care list provided to:: Patient Choice offered to / list presented to : Patient, Adult Children [facility] ownership interest in [facility].provided to:: Adult Children    Discharge Placement  [facility]            Patient chooses bed at: [facility] Patient to be transferred to facility by: PTAR Name of family member notified: Pt's daughter, Haruko Mersch Patient and family notified of of transfer: 04/16/24  Discharge Plan and Services Additional resources added to the After Visit Summary for  N/A In-house Referral: NA Discharge Planning Services: NA Post Acute Care Choice: Skilled Nursing Facility          DME Arranged: N/A DME Agency: NA       HH Arranged: NA HH Agency: NA        Social Drivers of Health (SDOH) Interventions SDOH Screenings   Food Insecurity: No Food Insecurity (04/14/2024)  Housing: Low Risk  (04/14/2024)  Transportation Needs: No Transportation Needs (04/14/2024)  Utilities: Not At Risk (04/14/2024)  Social Connections: Socially Isolated (04/14/2024)  Tobacco Use: Medium Risk (04/12/2024)     Readmission Risk  Interventions    04/13/2024    2:06 PM 11/13/2021    1:56 PM  Readmission Risk Prevention Plan  Transportation Screening Complete Complete  PCP or Specialist Appt within 5-7 Days Complete Complete  Home Care Screening Complete Complete  Medication Review (RN CM) Complete Complete

## 2024-04-16 NOTE — Plan of Care (Signed)
  Problem: Clinical Measurements: Goal: Ability to maintain clinical measurements within normal limits will improve Outcome: Progressing Goal: Will remain free from infection Outcome: Progressing Goal: Diagnostic test results will improve Outcome: Progressing Goal: Respiratory complications will improve Outcome: Progressing Goal: Cardiovascular complication will be avoided Outcome: Progressing   Problem: Nutrition: Goal: Adequate nutrition will be maintained Outcome: Progressing   Problem: Coping: Goal: Level of anxiety will decrease Outcome: Progressing   Problem: Elimination: Goal: Will not experience complications related to bowel motility Outcome: Progressing Goal: Will not experience complications related to urinary retention Outcome: Progressing   Problem: Safety: Goal: Ability to remain free from injury will improve Outcome: Progressing   Problem: Skin Integrity: Goal: Risk for impaired skin integrity will decrease Outcome: Progressing   Problem: Education: Goal: Knowledge of General Education information will improve Description: Including pain rating scale, medication(s)/side effects and non-pharmacologic comfort measures Outcome: Not Progressing   Problem: Health Behavior/Discharge Planning: Goal: Ability to manage health-related needs will improve Outcome: Not Progressing   Problem: Activity: Goal: Risk for activity intolerance will decrease Outcome: Not Progressing   Problem: Pain Managment: Goal: General experience of comfort will improve and/or be controlled Outcome: Not Progressing

## 2024-04-16 NOTE — Discharge Summary (Signed)
 Physician Discharge Summary   Patient: Elaine Rivera MRN: [id number] DOB: 11/21/1945  Admit date:     04/12/2024  Discharge date: 04/16/24  Discharge Physician: Elgin Lam, MD   PCP: Patient, No Pcp Per   Recommendations at discharge:  PCP visit for hospital follow-up  Discharge Diagnoses: Principal Problem:   Altered mental status Active Problems:   Depression   HTN (hypertension)   GERD (gastroesophageal reflux disease)   Anemia   Hypokalemia  Resolved Problems:   * No resolved hospital problems. *  Hospital Course: Elaine Rivera is a 78 y.o. female with a history of anemia, depression, GERD, hypertension, malnutrition, stroke, dementia.  Patient presented secondary to altered mental status with concern for urinary tract infection.  Patient started empirically on antibiotics and urine culture significant for Klebsiella pneumoniae. Patient completed antibiotic treatment course.  Assessment and Plan:  Acute metabolic encephalopathy Appears to be secondary to acute UTI.  Mental status appears to have returned to baseline.   Urinary tract infection Present on admission.  Urinalysis suggests urinary tract infection.  Urine culture obtained.  Patient started empirically on ceftriaxone . Urine culture significant for Klebsiella pneumoniae with resistance to ampicillin and nitrofurantoin. Completed a 3-day course of Ceftriaxone .   Hypokalemia Potassium of 2.2 on admission.  Patient managed with potassium supplementation. Resolved.   History of GERD Continue Protonix    Primary hypertension Noted.  Patient is not on antihypertensive medications as an outpatient.   Chronic pain Continue gabapentin    Depression Continue Lexparo   Severe malnutrition Continue megace  discharge with multivitamins and feeding supplement per dietitian recommendations. Dietitian recommendations (7/17): Liberalize diet to Regular to avoid restricting intake.  Ensure Plus High Protein po  BID, each supplement provides 350 kcal and 20 grams of protein. Magic cup BID with meals, each supplement provides 290 kcal and 9 grams of protein Encourage intake at all meals and of supplements.  Multivitamin with minerals daily. Monitor weight trends.   Consultants: None Procedures performed: None  Disposition: Home Diet recommendation: Regular diet   DISCHARGE MEDICATION: Allergies as of 04/16/2024       Reactions   Sulfa Antibiotics Other (See Comments)   Reaction:  Unknown    Tramadol  Other (See Comments)   Seizures. Allergy not listed on MAR         Medication List     TAKE these medications    acetaminophen  500 MG tablet Commonly known as: TYLENOL  Take 500 mg by mouth every 6 (six) hours as needed for moderate pain (pain score 4-6) or mild pain (pain score 1-3).   escitalopram  10 MG tablet Commonly known as: LEXAPRO  Take 10 mg by mouth daily.   feeding supplement Liqd Take 237 mLs by mouth 2 (two) times daily between meals.   gabapentin  100 MG capsule Commonly known as: NEURONTIN  Take 100 mg by mouth 3 (three) times daily.   megestrol  20 MG tablet Commonly known as: MEGACE  Take 20 mg by mouth 2 (two) times daily.   multivitamin with minerals Tabs tablet Take 1 tablet by mouth daily. Start taking on: April 17, 2024   pantoprazole  40 MG tablet Commonly known as: PROTONIX  Take 1 tablet (40 mg total) by mouth daily.   senna-docusate 8.6-50 MG tablet Commonly known as: Senokot-S Take 1 tablet by mouth at bedtime as needed for moderate constipation. What changed: when to take this        Contact information for after-discharge care     Destination     [facility] .  Service: Skilled Nursing Contact information: [address] [place]   [address] [phone]                    Discharge Exam: BP 125/83 (BP Location: Right Arm)   Pulse 97   Temp 97.8 F (36.6 C)   Resp 17   Ht 4' 11 (1.499 m)   Wt 42.3 kg    SpO2 95%   BMI 18.84 kg/m   General exam: Appears calm and comfortable Respiratory system: Respiratory effort normal. Central nervous system: Alert and oriented to person and place Psychiatry: Judgement and insight appear normal. Mood & affect appropriate.   Condition at discharge: stable  The results of significant diagnostics from this hospitalization (including imaging, microbiology, ancillary and laboratory) are listed below for reference.   Imaging Studies: CT HEAD WO CONTRAST Result Date: 04/12/2024 CLINICAL DATA:  78 year old female with unexplained altered mental status. History of partially thrombosed giant right PICA aneurysm. EXAM: CT HEAD WITHOUT CONTRAST TECHNIQUE: Contiguous axial images were obtained from the base of the skull through the vertex without intravenous contrast. RADIATION DOSE REDUCTION: This exam was performed according to the departmental dose-optimization program which includes automated exposure control, adjustment of the mA and/or kV according to patient size and/or use of iterative reconstruction technique. COMPARISON:  CTA head and neck 03/14/2021 and earlier. Head CT and brain MRI 01/20/2024 FINDINGS: Brain: Mixed density but predominantly hyperdense round, oval roughly 2 cm mass at the right lateral medulla, cisterna magna corresponds to chronic PICA aneurysm and is stable. Stable right medullary mass effect, but no brainstem or cerebellar edema identified. Stable background cerebral volume, nonspecific lateral and 3rd ventriculomegaly. No supratentorial midline shift or mass effect. Stable confluent periventricular white matter hypodensity. No acute intracranial hemorrhage identified. No cortically based acute infarct identified. Vascular: Chronic distal right vertebral artery vascular stent, tortuosity. No suspicious intracranial vascular hyperdensity. Skull: Chronic right suboccipital craniectomy. No acute osseous abnormality identified. Sinuses/Orbits: Chronic  sphenoid sinus mucosal thickening and some bubbly opacity. Other paranasal sinuses and mastoids are stable and well aerated. Other: No acute orbit or scalp soft tissue finding. Chronic right suboccipital soft tissue scarring. IMPRESSION: 1. No acute intracranial abnormality. 2. 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 3. Chronic nonspecific supratentorial ventriculomegaly and cerebral white matter disease. Electronically Signed   By: VEAR Hurst M.D.   On: 04/12/2024 13:15    Microbiology: Results for orders placed or performed during the hospital encounter of 04/12/24  Urine Culture (for pregnant, neutropenic or urologic patients or patients with an indwelling urinary catheter)     Status: Abnormal   Collection Time: 04/12/24  1:19 PM   Specimen: Urine, Clean Catch  Result Value Ref Range Status   Specimen Description   Final    URINE, CLEAN CATCH Performed at [facility], [address] [place], [place] [address]    Special Requests   Final    NONE Performed at [facility], [address], [place], [place] [address]    Culture >=100,000 COLONIES/mL KLEBSIELLA PNEUMONIAE (A)  Final   Report Status 04/14/2024 FINAL  Final   Organism ID, Bacteria KLEBSIELLA PNEUMONIAE (A)  Final      Susceptibility   Klebsiella pneumoniae - MIC*    AMPICILLIN >=32 RESISTANT Resistant     CEFAZOLIN  <=4 SENSITIVE Sensitive     CEFEPIME <=0.12 SENSITIVE Sensitive     CEFTRIAXONE  <=  0.25 SENSITIVE Sensitive     CIPROFLOXACIN  <=0.25 SENSITIVE Sensitive     GENTAMICIN  <=1 SENSITIVE Sensitive     IMIPENEM <=0.25 SENSITIVE Sensitive     NITROFURANTOIN 128 RESISTANT Resistant     TRIMETH/SULFA <=20 SENSITIVE Sensitive     AMPICILLIN/SULBACTAM 4 SENSITIVE Sensitive     PIP/TAZO <=4 SENSITIVE Sensitive ug/mL    * >=100,000 COLONIES/mL KLEBSIELLA PNEUMONIAE     Labs: CBC: Recent Labs  Lab 04/12/24 1319 04/13/24 0537  WBC 14.0* 9.9  NEUTROABS 12.6*  --   HGB 12.8 12.4  HCT 38.1 36.3  MCV 88.4 88.8  PLT 154 149*   Basic Metabolic Panel: Recent Labs  Lab 04/12/24 1319 04/13/24 0537 04/13/24 1813 04/14/24 0500  NA 140 138  --  134*  K 2.2* 2.7* 3.2* 3.6  CL 107 103  --  102  CO2 23 25  --  21*  GLUCOSE 115* 111*  --  104*  BUN 22 24*  --  15  CREATININE 0.55 0.54  --  0.40*  CALCIUM  8.6* 9.1  --  8.9  MG 1.8 2.0  --   --    Liver Function Tests: Recent Labs  Lab 04/12/24 1319 04/13/24 0537  AST 12* 13*  ALT 10 11  ALKPHOS 65 61  BILITOT 0.9 1.1  PROT 6.3* 6.6  ALBUMIN 2.9* 2.9*   CBG: Recent Labs  Lab 04/12/24 1316  GLUCAP 130*    Discharge time spent: 35 minutes.  Signed: Elgin Lam, MD [facility] 04/16/2024
